# Patient Record
Sex: Female | Born: 1940 | Race: White | Hispanic: No | Marital: Married | Smoking: Former smoker
Health system: Southern US, Community
[De-identification: ages and names within clinical notes are randomized; demographics above are authoritative.]

## PROBLEM LIST (undated history)

## (undated) DIAGNOSIS — I471 Supraventricular tachycardia, unspecified: Secondary | ICD-10-CM

## (undated) DIAGNOSIS — I472 Ventricular tachycardia, unspecified: Secondary | ICD-10-CM

## (undated) DIAGNOSIS — G510 Bell's palsy: Secondary | ICD-10-CM

## (undated) DIAGNOSIS — Z9981 Dependence on supplemental oxygen: Secondary | ICD-10-CM

## (undated) DIAGNOSIS — E785 Hyperlipidemia, unspecified: Secondary | ICD-10-CM

## (undated) DIAGNOSIS — I714 Abdominal aortic aneurysm, without rupture, unspecified: Secondary | ICD-10-CM

## (undated) DIAGNOSIS — I4891 Unspecified atrial fibrillation: Secondary | ICD-10-CM

## (undated) DIAGNOSIS — M1990 Unspecified osteoarthritis, unspecified site: Secondary | ICD-10-CM

## (undated) DIAGNOSIS — I10 Essential (primary) hypertension: Secondary | ICD-10-CM

## (undated) DIAGNOSIS — I509 Heart failure, unspecified: Secondary | ICD-10-CM

## (undated) DIAGNOSIS — C679 Malignant neoplasm of bladder, unspecified: Secondary | ICD-10-CM

## (undated) DIAGNOSIS — R0602 Shortness of breath: Secondary | ICD-10-CM

## (undated) DIAGNOSIS — H409 Unspecified glaucoma: Secondary | ICD-10-CM

## (undated) DIAGNOSIS — J189 Pneumonia, unspecified organism: Secondary | ICD-10-CM

## (undated) DIAGNOSIS — T4145XA Adverse effect of unspecified anesthetic, initial encounter: Secondary | ICD-10-CM

## (undated) DIAGNOSIS — K219 Gastro-esophageal reflux disease without esophagitis: Secondary | ICD-10-CM

## (undated) DIAGNOSIS — I679 Cerebrovascular disease, unspecified: Secondary | ICD-10-CM

## (undated) DIAGNOSIS — D649 Anemia, unspecified: Secondary | ICD-10-CM

## (undated) DIAGNOSIS — J449 Chronic obstructive pulmonary disease, unspecified: Secondary | ICD-10-CM

## (undated) DIAGNOSIS — T8859XA Other complications of anesthesia, initial encounter: Secondary | ICD-10-CM

## (undated) DIAGNOSIS — I6529 Occlusion and stenosis of unspecified carotid artery: Secondary | ICD-10-CM

## (undated) DIAGNOSIS — E669 Obesity, unspecified: Secondary | ICD-10-CM

## (undated) DIAGNOSIS — I2510 Atherosclerotic heart disease of native coronary artery without angina pectoris: Secondary | ICD-10-CM

## (undated) DIAGNOSIS — G43909 Migraine, unspecified, not intractable, without status migrainosus: Secondary | ICD-10-CM

## (undated) HISTORY — DX: Unspecified atrial fibrillation: I48.91

## (undated) HISTORY — DX: Occlusion and stenosis of unspecified carotid artery: I65.29

## (undated) HISTORY — DX: Supraventricular tachycardia, unspecified: I47.10

## (undated) HISTORY — DX: Obesity, unspecified: E66.9

## (undated) HISTORY — DX: Unspecified glaucoma: H40.9

## (undated) HISTORY — DX: Essential (primary) hypertension: I10

## (undated) HISTORY — DX: Abdominal aortic aneurysm, without rupture: I71.4

## (undated) HISTORY — DX: Abdominal aortic aneurysm, without rupture, unspecified: I71.40

## (undated) HISTORY — DX: Ventricular tachycardia, unspecified: I47.20

## (undated) HISTORY — DX: Bell's palsy: G51.0

## (undated) HISTORY — PX: BLADDER TUMOR EXCISION: SHX238

## (undated) HISTORY — PX: CYSTOSCOPY: SHX5120

## (undated) HISTORY — DX: Atherosclerotic heart disease of native coronary artery without angina pectoris: I25.10

## (undated) HISTORY — PX: CARDIAC CATHETERIZATION: SHX172

## (undated) HISTORY — DX: Hyperlipidemia, unspecified: E78.5

## (undated) HISTORY — DX: Cerebrovascular disease, unspecified: I67.9

## (undated) HISTORY — DX: Anemia, unspecified: D64.9

## (undated) HISTORY — DX: Ventricular tachycardia: I47.2

## (undated) HISTORY — DX: Supraventricular tachycardia: I47.1

## (undated) HISTORY — PX: CYSTOSCOPY: SUR368

## (undated) HISTORY — PX: CATARACT EXTRACTION W/ INTRAOCULAR LENS  IMPLANT, BILATERAL: SHX1307

## (undated) HISTORY — DX: Chronic obstructive pulmonary disease, unspecified: J44.9

---

## 1978-10-27 HISTORY — PX: TUBAL LIGATION: SHX77

## 1978-10-27 HISTORY — PX: WEDGE RESECTION: SHX5070

## 2004-02-15 ENCOUNTER — Ambulatory Visit: Payer: Self-pay | Admitting: Family Medicine

## 2004-02-22 ENCOUNTER — Ambulatory Visit: Payer: Self-pay | Admitting: Family Medicine

## 2004-03-20 ENCOUNTER — Ambulatory Visit: Payer: Self-pay | Admitting: Family Medicine

## 2004-05-02 ENCOUNTER — Ambulatory Visit: Payer: Self-pay | Admitting: Family Medicine

## 2004-10-02 ENCOUNTER — Ambulatory Visit: Payer: Self-pay | Admitting: Family Medicine

## 2004-11-30 ENCOUNTER — Ambulatory Visit: Payer: Self-pay | Admitting: Family Medicine

## 2004-12-17 ENCOUNTER — Ambulatory Visit: Payer: Self-pay | Admitting: Family Medicine

## 2004-12-28 ENCOUNTER — Ambulatory Visit: Payer: Self-pay | Admitting: Family Medicine

## 2005-05-01 ENCOUNTER — Ambulatory Visit: Payer: Self-pay | Admitting: Family Medicine

## 2005-05-20 ENCOUNTER — Ambulatory Visit: Payer: Self-pay | Admitting: Family Medicine

## 2005-05-27 ENCOUNTER — Ambulatory Visit (HOSPITAL_COMMUNITY): Admission: RE | Admit: 2005-05-27 | Discharge: 2005-05-27 | Payer: Self-pay | Admitting: Family Medicine

## 2005-06-03 ENCOUNTER — Ambulatory Visit: Payer: Self-pay | Admitting: Family Medicine

## 2005-06-17 ENCOUNTER — Ambulatory Visit: Payer: Self-pay | Admitting: Gastroenterology

## 2005-06-21 ENCOUNTER — Ambulatory Visit: Payer: Self-pay | Admitting: Gastroenterology

## 2005-06-26 ENCOUNTER — Ambulatory Visit: Payer: Self-pay | Admitting: Family Medicine

## 2005-07-17 ENCOUNTER — Ambulatory Visit: Payer: Self-pay | Admitting: Family Medicine

## 2005-08-08 ENCOUNTER — Ambulatory Visit: Payer: Self-pay | Admitting: Family Medicine

## 2005-09-04 ENCOUNTER — Ambulatory Visit: Payer: Self-pay | Admitting: Family Medicine

## 2005-09-26 ENCOUNTER — Ambulatory Visit: Payer: Self-pay | Admitting: Family Medicine

## 2006-01-06 ENCOUNTER — Ambulatory Visit: Payer: Self-pay | Admitting: Family Medicine

## 2006-02-21 ENCOUNTER — Ambulatory Visit: Payer: Self-pay | Admitting: Family Medicine

## 2006-03-10 ENCOUNTER — Ambulatory Visit: Payer: Self-pay | Admitting: Family Medicine

## 2006-05-20 ENCOUNTER — Ambulatory Visit: Payer: Self-pay | Admitting: Family Medicine

## 2006-06-09 ENCOUNTER — Ambulatory Visit: Payer: Self-pay | Admitting: Family Medicine

## 2006-10-14 ENCOUNTER — Ambulatory Visit: Payer: Self-pay | Admitting: Internal Medicine

## 2006-10-24 ENCOUNTER — Ambulatory Visit (HOSPITAL_COMMUNITY): Admission: RE | Admit: 2006-10-24 | Discharge: 2006-10-24 | Payer: Self-pay | Admitting: Urology

## 2006-10-30 ENCOUNTER — Ambulatory Visit: Payer: Self-pay | Admitting: Cardiology

## 2006-11-11 ENCOUNTER — Ambulatory Visit (HOSPITAL_COMMUNITY): Admission: RE | Admit: 2006-11-11 | Discharge: 2006-11-11 | Payer: Self-pay | Admitting: Urology

## 2006-11-11 ENCOUNTER — Encounter (INDEPENDENT_AMBULATORY_CARE_PROVIDER_SITE_OTHER): Payer: Self-pay | Admitting: Urology

## 2007-02-26 DIAGNOSIS — C679 Malignant neoplasm of bladder, unspecified: Secondary | ICD-10-CM

## 2007-02-26 HISTORY — DX: Malignant neoplasm of bladder, unspecified: C67.9

## 2007-03-12 ENCOUNTER — Encounter: Payer: Self-pay | Admitting: Internal Medicine

## 2007-03-20 ENCOUNTER — Ambulatory Visit: Payer: Self-pay | Admitting: Cardiology

## 2007-04-14 ENCOUNTER — Ambulatory Visit: Payer: Self-pay

## 2007-04-14 ENCOUNTER — Encounter: Payer: Self-pay | Admitting: Cardiology

## 2007-04-16 ENCOUNTER — Ambulatory Visit: Payer: Self-pay | Admitting: Internal Medicine

## 2007-11-04 ENCOUNTER — Ambulatory Visit: Payer: Self-pay | Admitting: Cardiology

## 2007-11-04 ENCOUNTER — Ambulatory Visit: Payer: Self-pay

## 2008-01-08 ENCOUNTER — Encounter (INDEPENDENT_AMBULATORY_CARE_PROVIDER_SITE_OTHER): Payer: Self-pay | Admitting: Urology

## 2008-01-08 ENCOUNTER — Ambulatory Visit (HOSPITAL_BASED_OUTPATIENT_CLINIC_OR_DEPARTMENT_OTHER): Admission: RE | Admit: 2008-01-08 | Discharge: 2008-01-08 | Payer: Self-pay | Admitting: Urology

## 2008-05-11 ENCOUNTER — Ambulatory Visit: Payer: Self-pay

## 2008-06-10 ENCOUNTER — Ambulatory Visit (HOSPITAL_BASED_OUTPATIENT_CLINIC_OR_DEPARTMENT_OTHER): Admission: RE | Admit: 2008-06-10 | Discharge: 2008-06-10 | Payer: Self-pay | Admitting: Urology

## 2008-06-10 ENCOUNTER — Encounter (INDEPENDENT_AMBULATORY_CARE_PROVIDER_SITE_OTHER): Payer: Self-pay | Admitting: Urology

## 2008-07-26 ENCOUNTER — Encounter: Payer: Self-pay | Admitting: Cardiology

## 2008-08-17 DIAGNOSIS — H409 Unspecified glaucoma: Secondary | ICD-10-CM | POA: Insufficient documentation

## 2008-08-17 DIAGNOSIS — I10 Essential (primary) hypertension: Secondary | ICD-10-CM | POA: Insufficient documentation

## 2008-08-17 DIAGNOSIS — E785 Hyperlipidemia, unspecified: Secondary | ICD-10-CM

## 2008-08-17 DIAGNOSIS — J449 Chronic obstructive pulmonary disease, unspecified: Secondary | ICD-10-CM

## 2008-08-17 DIAGNOSIS — I679 Cerebrovascular disease, unspecified: Secondary | ICD-10-CM

## 2008-08-17 DIAGNOSIS — I498 Other specified cardiac arrhythmias: Secondary | ICD-10-CM | POA: Insufficient documentation

## 2008-08-17 DIAGNOSIS — E669 Obesity, unspecified: Secondary | ICD-10-CM

## 2008-08-18 ENCOUNTER — Ambulatory Visit: Payer: Self-pay | Admitting: Cardiology

## 2008-08-18 DIAGNOSIS — F17200 Nicotine dependence, unspecified, uncomplicated: Secondary | ICD-10-CM

## 2008-08-18 DIAGNOSIS — R0602 Shortness of breath: Secondary | ICD-10-CM

## 2008-11-01 ENCOUNTER — Encounter: Payer: Self-pay | Admitting: Cardiology

## 2008-11-17 ENCOUNTER — Ambulatory Visit: Payer: Self-pay

## 2008-11-17 ENCOUNTER — Encounter: Payer: Self-pay | Admitting: Cardiology

## 2009-02-03 ENCOUNTER — Encounter: Payer: Self-pay | Admitting: Cardiovascular Disease

## 2009-05-08 ENCOUNTER — Encounter: Payer: Self-pay | Admitting: Cardiology

## 2009-05-19 ENCOUNTER — Encounter: Payer: Self-pay | Admitting: Cardiology

## 2009-05-19 DIAGNOSIS — I6529 Occlusion and stenosis of unspecified carotid artery: Secondary | ICD-10-CM

## 2009-05-23 ENCOUNTER — Ambulatory Visit: Payer: Self-pay | Admitting: Cardiology

## 2009-05-23 ENCOUNTER — Ambulatory Visit: Payer: Self-pay

## 2009-06-19 ENCOUNTER — Ambulatory Visit: Payer: Self-pay | Admitting: Surgery

## 2009-11-09 ENCOUNTER — Encounter: Payer: Self-pay | Admitting: Cardiology

## 2009-11-15 ENCOUNTER — Encounter: Payer: Self-pay | Admitting: Cardiology

## 2009-12-29 ENCOUNTER — Ambulatory Visit: Payer: Self-pay

## 2009-12-29 ENCOUNTER — Ambulatory Visit: Payer: Self-pay | Admitting: Cardiology

## 2009-12-29 DIAGNOSIS — I714 Abdominal aortic aneurysm, without rupture: Secondary | ICD-10-CM

## 2010-01-08 ENCOUNTER — Ambulatory Visit: Payer: Self-pay | Admitting: Surgery

## 2010-01-08 ENCOUNTER — Encounter: Payer: Self-pay | Admitting: Cardiology

## 2010-01-23 ENCOUNTER — Telehealth (INDEPENDENT_AMBULATORY_CARE_PROVIDER_SITE_OTHER): Payer: Self-pay | Admitting: Radiology

## 2010-01-24 ENCOUNTER — Encounter (HOSPITAL_COMMUNITY)
Admission: RE | Admit: 2010-01-24 | Discharge: 2010-03-27 | Payer: Self-pay | Source: Home / Self Care | Attending: Surgery | Admitting: Surgery

## 2010-01-24 ENCOUNTER — Encounter: Payer: Self-pay | Admitting: Cardiology

## 2010-01-24 ENCOUNTER — Ambulatory Visit: Payer: Self-pay | Admitting: Cardiology

## 2010-01-24 ENCOUNTER — Ambulatory Visit: Payer: Self-pay

## 2010-01-25 ENCOUNTER — Ambulatory Visit: Payer: Self-pay | Admitting: Internal Medicine

## 2010-01-26 ENCOUNTER — Telehealth: Payer: Self-pay | Admitting: Internal Medicine

## 2010-03-02 ENCOUNTER — Ambulatory Visit: Admit: 2010-03-02 | Payer: Self-pay | Admitting: Internal Medicine

## 2010-03-05 ENCOUNTER — Ambulatory Visit: Admit: 2010-03-05 | Payer: Self-pay | Admitting: Surgery

## 2010-03-18 ENCOUNTER — Encounter: Payer: Self-pay | Admitting: Family Medicine

## 2010-03-29 NOTE — Assessment & Plan Note (Signed)
Summary: Cardiology Nuclear Testing  Nuclear Med Background Indications for Stress Test: Evaluation for Ischemia, Surgical Clearance  Indications Comments: Pending Carotid Endarterectomy by Coral Else.  History: COPD, Echo, Emphysema, Myocardial Perfusion Study  History Comments: 10-15 yrs ago Cath: OK per patient. '03 MPI ant thinning/ nml EF  '09 Echo nml EF Hx. SVT, AAA 3.5CM.  Symptoms: Chest Pain, Chest Pain with Exertion, DOE, Fatigue, Fatigue with Exertion, Light-Headedness, Rapid HR    Nuclear Pre-Procedure Cardiac Risk Factors: Carotid Disease, Family History - CAD, Hypertension, Lipids, PVD, Smoker Caffeine/Decaff Intake: None NPO After: 11:00 PM Lungs: audible wheezes, diminished lung fields, no wheezing IV 0.9% NS with Angio Cath: 22g     IV Site: R Hand IV Started by: Bonnita Levan, RN Chest Size (in) 50     Height (in): 65 Weight (lb): 181 BMI: 30.23 Tech Comments:  Nebulizer treatment with Albuterol 5mg  solution via aerosol mask with 8L O2 given with Lexiscan.  Nuclear Med Study 1 or 2 day study:  1 day     Stress Test Type:  Eugenie Birks Reading MD:  Marca Ancona, MD     Referring MD:  Coral Else Resting Radionuclide:  Technetium 25m Tetrofosmin     Resting Radionuclide Dose:  11.0 mCi  Stress Radionuclide:  Technetium 40m Tetrofosmin     Stress Radionuclide Dose:  33.0 mCi   Stress Protocol      Max HR:  114 bpm     Predicted Max HR:  152 bpm  Max Systolic BP: 100 mm Hg     Percent Max HR:  75 %Rate Pressure Product:  81191  Lexiscan: 0.4 mg   Stress Test Technologist:  Irean Hong,  RN     Nuclear Technologist:  Doyne Keel, CNMT  Rest Procedure  Myocardial perfusion imaging was performed at rest 45 minutes following the intravenous administration of Technetium 92m Tetrofosmin.  Stress Procedure  The patient received IV Lexiscan 0.4 mg over 15-seconds.  Technetium 38m Tetrofosmin injected at 30-seconds.  There were nonspecific  changes with  Lexiscan, rare PVC.  Quantitative spect images were obtained after a 45 minute delay.  QPS Raw Data Images:  Prominent breast shadow.  Stress Images:  Small apical perfusion defect.  Rest Images:  Small apical perfusion defect.  Subtraction (SDS):  Small fixed apical perfusion defect.  Transient Ischemic Dilatation:  1.04  (Normal <1.22)  Lung/Heart Ratio:  0.39  (Normal <0.45)  Quantitative Gated Spect Images QGS EDV:  38 ml QGS ESV:  12 ml QGS EF:  68 % QGS cine images:  Normal wall motion.    Overall Impression  Exercise Capacity: Lexiscan with no exercise. BP Response: Normal blood pressure response. Clinical Symptoms: Short of breath.  ECG Impression: No significant ST segment change suggestive of ischemia. Overall Impression: Small fixed apical perfusion defect.  Prominent breast shadow.  Suspect attenuation artifact with no ischemia or infarction.   Appended Document: Cardiology Nuclear Testing copy faxed to Dr. Myra Gianotti

## 2010-03-29 NOTE — Miscellaneous (Signed)
Summary: Orders Update  Clinical Lists Changes  Problems: Added new problem of CAROTID ARTERY STENOSIS (ICD-433.10) Orders: Added new Test order of Carotid Duplex (Carotid Duplex) - Signed 

## 2010-03-29 NOTE — Progress Notes (Signed)
Summary: nuc pre-procedure  Phone Note Outgoing Call   Call placed by: Domenic Polite, CNMT,  January 23, 2010 12:01 PM Call placed to: Patient Reason for Call: Confirm/change Appt Summary of Call: Reviewed information on Myoview Information Sheet (see scanned document for further details).  Spoke with patient.      Nuclear Med Background Indications for Stress Test: Evaluation for Ischemia, Surgical Clearance  Indications Comments: Pending Carotid Endarterectomy- Coral Else  History: COPD, Echo, Myocardial Perfusion Study  History Comments: '03 MPI ant thinning/ nml EF; '09 Echo nml EF  Symptoms: DOE    Nuclear Pre-Procedure Cardiac Risk Factors: Carotid Disease, Hypertension, Lipids, PVD, Smoker Height (in): 65 Tech Comments: 3.5 cm. AAA

## 2010-03-29 NOTE — Assessment & Plan Note (Signed)
Summary: f68m/dm   History of Present Illness: [name] [name] is a pleasant female who has a history of supraventricular tachycardia, hypertension, hyperlipidemia, and cerebrovascular disease. She had a catheterization in 1997 that showed no obstructive disease. Her last Myoview was performed on October 08, 2001.  At that time, she had an ejection fraction of 71%.  There was no evidence of scar ischemia.  Echocardiogram in February of 2009 showed normal LV function, mild biatrial enlargement, and mild tricuspid regurgitation. She also has a history of SVT.  She was seen  by Dr. Ladona Ridgel.    He apparently increased her beta-blocker and felt ablation could be considered if her symptoms worsened. Last carotid Dopplers in March of 2010 showed a 60-79% left internal carotid artery stenosis and a 0-39% right. I last saw her in June 2010. Since then she has dyspnea on exertion. It is unchanged compared to previous. It is relieved with rest. There is no associated chest pain. There is no orthopnea, PND, pedal edema, palpitations or syncope. She attributes her dyspnea to COPD. She does continue to smoke.  Current Medications (verified): 1)  Aspirin 81 Mg Tbec (Aspirin) .... Take One Tablet By Mouth Daily 2)  Lipitor 80 Mg Tabs (Atorvastatin Calcium) .... Take One Tablet By Mouth Daily. 3)  Metoprolol Succinate 50 Mg Xr24h-Tab (Metoprolol Succinate) .... Take One Tablet By Mouth Daily 4)  Benicar Hct 40-12.5 Mg Tabs (Olmesartan Medoxomil-Hctz) .Marland Kitchen.. 1 Tab By Mouth Once Daily 5)  Tylenol .... As Needed 6)  Hydrochlorothiazide .... As Needed 7)  Travatan Z 0.004 % Soln (Travoprost) .Marland Kitchen.. 1 Drop Each Eye 8)  Albuterol Sulfate .... Once Daily 9)  Symbicort .... Once Daily 10)  Nebulizer .... Once Daily 11)  Fish Oil   Oil (Fish Oil) .Marland Kitchen.. 1 Tab By Mouth Once Daily  Past History:  Past Medical History: Reviewed history from 08/17/2008 and no changes required. Current Problems:  SUPRAVENTRICULAR TACHYCARDIA  (ICD-427.89) HYPERLIPIDEMIA (ICD-272.4) HYPERTENSION (ICD-401.9) CEREBROVASCULAR DISEASE (ICD-437.9) GLAUCOMA (ICD-365.9) OBESITY (ICD-278.00) COPD (ICD-496)  Past Surgical History: Reviewed history from 08/17/2008 and no changes required.   Cystoscopy, resection of 5 bladder tumors, and fulguration  of 1 small bladder tumor.       Cystoscopy, cold cup bladder biopsy of five tumors and  fulguration of one tumor.    Remote right lung surgery.    Bilateral tubal ligation.   Social History: Reviewed history from 08/17/2008 and no changes required.  The patient continues to smoke cigarettes smoking one-   half to one pack per day and has done so all of her adult life.  She   denies alcohol abuse.  She states that she likes to gamble in [place],   [place].      Review of Systems       Some arthralgias but no fevers or chills, productive cough, hemoptysis, dysphasia, odynophagia, melena, hematochezia, dysuria, hematuria, rash, seizure activity, orthopnea, PND, pedal edema, claudication. Remaining systems are negative.   Vital Signs:  Patient profile:   70 year old female Height:      65 inches Weight:      186 pounds BMI:     31.06 Pulse rate:   87 / minute Resp:     18 per minute BP sitting:   136 / 79  (left arm)  Vitals Entered By: Kem Parkinson (May 23, 2009 11:55 AM)  Physical Exam  General:  Well-developed obese in no acute distress.  Skin is warm and dry.  HEENT is  normal.  Neck is supple. No thyromegaly. left carotid bruit Chest is diminished breath sounds throughout Cardiovascular exam is regular rate and rhythm.  Abdominal exam nontender or distended. No masses palpated. Extremities show no edema. neuro grossly intact    EKG  Procedure date:  05/23/2009  Findings:      Sinus rhythm at a rate of 87. Right axis deviation. No significant ST changes.  Impression & Recommendations:  Problem # 1:  CAROTID ARTERY STENOSIS  (ICD-433.10) Continued aspirin and statin. Preliminary results of carotid Dopplers suggests severe disease on the left. Once we have the final results we will refer to vascular surgery if needed. If indeed she needs intervention then she will need a preoperative stress test. Her updated medication list for this problem includes:    Aspirin 81 Mg Tbec (Aspirin) .Marland Kitchen... Take one tablet by mouth daily  Problem # 2:  TOBACCO ABUSE (ICD-305.1) Patient counseled on discontinuing for between 3-10 minutes.  Problem # 3:  SUPRAVENTRICULAR TACHYCARDIA (ICD-427.89)  Nrecent symptoms. Continue beta blocker. Consider ablation in the future if refractory symptoms. Her updated medication list for this problem includes:    Aspirin 81 Mg Tbec (Aspirin) .Marland Kitchen... Take one tablet by mouth daily    Metoprolol Succinate 50 Mg Xr24h-tab (Metoprolol succinate) .Marland Kitchen... Take one tablet by mouth daily  Her updated medication list for this problem includes:    Aspirin 81 Mg Tbec (Aspirin) .Marland Kitchen... Take one tablet by mouth daily    Metoprolol Succinate 50 Mg Xr24h-tab (Metoprolol succinate) .Marland Kitchen... Take one tablet by mouth daily  Problem # 4:  HYPERLIPIDEMIA (ICD-272.4) Continue statin. Lipids and liver monitored by primary care. Her updated medication list for this problem includes:    Lipitor 80 Mg Tabs (Atorvastatin calcium) .Marland Kitchen... Take one tablet by mouth daily.  Her updated medication list for this problem includes:    Lipitor 80 Mg Tabs (Atorvastatin calcium) .Marland Kitchen... Take one tablet by mouth daily.  Problem # 5:  HYPERTENSION (ICD-401.9) Blood pressure controlled on present medications. Will continue. Renal function and potassium monitored by primary care. Her updated medication list for this problem includes:    Aspirin 81 Mg Tbec (Aspirin) .Marland Kitchen... Take one tablet by mouth daily    Metoprolol Succinate 50 Mg Xr24h-tab (Metoprolol succinate) .Marland Kitchen... Take one tablet by mouth daily    Benicar Hct 40-12.5 Mg Tabs (Olmesartan  medoxomil-hctz) .Marland Kitchen... 1 tab by mouth once daily  Problem # 6:  COPD (ICD-496)  Patient Instructions: 1)  Your physician recommends that you schedule a follow-up appointment in: 6 months.

## 2010-03-29 NOTE — Letter (Signed)
Summary: [facility] Update   [facility] Update   Imported By: Roderic Ovens 12/06/2009 10:56:06  _____________________________________________________________________  External Attachment:    Type:   Image     Comment:   External Document

## 2010-03-29 NOTE — Assessment & Plan Note (Signed)
Summary: 6 month rov.sl   History of Present Illness: Mrs. Frison is a pleasant female who has a history of supraventricular tachycardia, hypertension, hyperlipidemia, and cerebrovascular disease. She had a catheterization in 1997 that showed no obstructive disease. Her last Myoview was performed on October 08, 2001.  At that time, she had an ejection fraction of 71%.  There was no evidence of scar ischemia.  Echocardiogram in February of 2009 showed normal LV function, mild biatrial enlargement, and mild tricuspid regurgitation. She also has a history of SVT.  She was seen  by Dr. Ladona Ridgel. He apparently increased her beta-blocker and felt ablation could be considered if her symptoms worsened. Last carotid Dopplers in March of 2011 showed a 80-99% left internal carotid artery stenosis and a 0-39% right. F/U recommended in 6 months. Abdominal CAT scan in September of 2011 showed a 3.5 cm abdominal aortic aneurysm. I last saw her in March of 2011. Since then she has dyspnea on exertion. It is unchanged compared to previous. It is relieved with rest. There is no associated chest pain. There is no orthopnea, PND, pedal edema, palpitations or syncope. She attributes her dyspnea to COPD. She does continue to smoke.  Current Medications (verified): 1)  Aspirin 81 Mg Tbec (Aspirin) .... Take One Tablet By Mouth Daily 2)  Lipitor 80 Mg Tabs (Atorvastatin Calcium) .... Take One Tablet By Mouth Daily. 3)  Metoprolol Succinate 50 Mg Xr24h-Tab (Metoprolol Succinate) .... Take One Tablet By Mouth Daily 4)  Benicar Hct 40-12.5 Mg Tabs (Olmesartan Medoxomil-Hctz) .Marland Kitchen.. 1 Tab By Mouth Once Daily 5)  Tylenol 325 Mg Tabs (Acetaminophen) .... As Needed 6)  Hydrochlorothiazide  (Hydrochlorothiazide) .... As Needed 7)  Travatan Z 0.004 % Soln (Travoprost) .Marland Kitchen.. 1 Drop Each Eye 8)  Albuterol Sulfate .... Once Daily 9)  Symbicort 80-4.5 Mcg/act Aero (Budesonide-Formoterol Fumarate) .... As Directed 10)  Nebulizer .... Once  Daily 11)  Fish Oil   Oil (Fish Oil) .Marland Kitchen.. 1 Tab By Mouth Once Daily 12)  Bc Powder .... As Directed  Past History:  Past Medical History: SUPRAVENTRICULAR TACHYCARDIA (ICD-427.89) HYPERLIPIDEMIA (ICD-272.4) HYPERTENSION (ICD-401.9) CEREBROVASCULAR DISEASE (ICD-437.9) GLAUCOMA (ICD-365.9) OBESITY (ICD-278.00) COPD (ICD-496)  Past Surgical History: Cystoscopy, resection of 5 bladder tumors, and fulguration  of 1 small bladder tumor.  Cystoscopy, cold cup bladder biopsy of five tumors and  fulguration of one tumor.  Remote right lung surgery.  Bilateral tubal ligation.   Social History: Reviewed history from 08/17/2008 and no changes required. The patient continues to smoke cigarettes smoking one- half to one pack per day and has done so all of her adult life.  She denies alcohol abuse.  She states that she likes to gamble in [place], [place].      Review of Systems       no fevers or chills, productive cough, hemoptysis, dysphasia, odynophagia, melena, hematochezia, dysuria, hematuria, rash, seizure activity, orthopnea, PND, pedal edema, claudication. Remaining systems are negative.   Vital Signs:  Patient profile:   70 year old female Height:      65 inches Weight:      186 pounds BMI:     31.06 Pulse rate:   86 / minute Resp:     18 per minute BP sitting:   120 / 80  (left arm)  Vitals Entered By: Kem Parkinson (December 29, 2009 1:54 PM)  Physical Exam  General:  Well-developed well-nourished in no acute distress.  Skin is warm and dry.  HEENT is normal.  Neck is supple. No thyromegaly.  Chest is diminished throughout Cardiovascular exam is regular rate and rhythm.  Abdominal exam nontender or distended. No masses palpated. Extremities show no edema. neuro grossly intact    EKG  Procedure date:  12/29/2009  Findings:      Sinus rhythm at a rate of 86. No ST changes.  Impression & Recommendations:  Problem # 1:  ABDOMINAL AORTIC ANEURYSM  (ICD-441.4) Followup abdominal ultrasound September 2012.  Problem # 2:  CAROTID ARTERY STENOSIS (ICD-433.10) Referred back to vascular surgery. Continue aspirin and statin. Await final results of carotid Dopplers performed today. In surgery required she will need a Myoview preoperatively. Her updated medication list for this problem includes:    Aspirin 81 Mg Tbec (Aspirin) .Marland Kitchen... Take one tablet by mouth daily  Orders: [facility] Referral ([facility] Ref)  Problem # 3:  TOBACCO ABUSE (ICD-305.1) Patient counseled on discontinuing.  Problem # 4:  SUPRAVENTRICULAR TACHYCARDIA (ICD-427.89) Continue beta blocker. Her updated medication list for this problem includes:    Aspirin 81 Mg Tbec (Aspirin) .Marland Kitchen... Take one tablet by mouth daily    Metoprolol Succinate 50 Mg Xr24h-tab (Metoprolol succinate) .Marland Kitchen... Take one tablet by mouth daily  Problem # 5:  HYPERLIPIDEMIA (ICD-272.4) Continue statin. Lipids and liver monitored by primary care. Her updated medication list for this problem includes:    Lipitor 80 Mg Tabs (Atorvastatin calcium) .Marland Kitchen... Take one tablet by mouth daily.  Problem # 6:  HYPERTENSION (ICD-401.9) Blood pressure control. Potassium and renal function monitored by primary care. Her updated medication list for this problem includes:    Aspirin 81 Mg Tbec (Aspirin) .Marland Kitchen... Take one tablet by mouth daily    Metoprolol Succinate 50 Mg Xr24h-tab (Metoprolol succinate) .Marland Kitchen... Take one tablet by mouth daily    Benicar Hct 40-12.5 Mg Tabs (Olmesartan medoxomil-hctz) .Marland Kitchen... 1 tab by mouth once daily  Problem # 7:  COPD (ICD-496)  Her updated medication list for this problem includes:    Symbicort 80-4.5 Mcg/act Aero (Budesonide-formoterol fumarate) .Marland Kitchen... As directed  Patient Instructions: 1)  Your physician recommends that you schedule a follow-up appointment in: 6 MONTHS 2)  You have been referred to DR [facility]

## 2010-03-29 NOTE — Letter (Signed)
Summary: [facility]  [facility]   Imported By: Sherian Rein 01/16/2010 14:08:22  _____________________________________________________________________  External Attachment:    Type:   Image     Comment:   External Document  Appended Document: [facility] lexiscan myoview needs to be scheduled for preop eval prior to carotid endarterectomy  Appended Document: [facility] myoview scheduled for 01-24-10 for pre-op clearence

## 2010-03-29 NOTE — Letter (Signed)
Summary: [facility] Office Visit Note   [facility] Office Visit Note   Imported By: Roderic Ovens 12/05/2009 09:48:15  _____________________________________________________________________  External Attachment:    Type:   Image     Comment:   External Document

## 2010-03-29 NOTE — Miscellaneous (Signed)
Summary: Orders Update  Clinical Lists Changes  Orders: Added new Test order of Carotid Duplex (Carotid Duplex) - Signed 

## 2010-03-29 NOTE — Progress Notes (Signed)
Summary: O2 / HEARTRATE > ok to use a needed  Phone Note Call from Patient Call back at [facility] Phone [phone]   Caller: Patient Call For: Gwendlyn Hanback Summary of Call: PT WAS SEEN YESTERDAY. SHE SAYS THAT SHE SLEPT W/ O2 AS INSTRUCTED ON 2.5L. WOKE UP DURING THE NIGHT WITH HER HEART "POUNDING". PLS ADVISE.  Initial call taken by: Tivis Ringer, CNA,  January 26, 2010 2:57 PM  Follow-up for Phone Call        Spoke with pt  and she states she wore the oxygen at night as directed per MW and she states she got a headache, felt her heart beating fast during the night so she stopped the oxygen and took an aspirin and the symptoms went away. Pt has not used the oxygen today at all. Pt wanted to [facility] if the increse in oxygen to 2.5 liters could cause these symptoms. Please advise. Carron Curie CMA  January 26, 2010 4:19 PM discussed with pt ok to just use 02 as needed for now as long as not smoking  Follow-up by: Nyoka Cowden MD,  January 26, 2010 5:38 PM

## 2010-03-29 NOTE — Assessment & Plan Note (Signed)
Summary: Pulmonary pre op eval, still smoking with desat ra p 50 ft    Copy to:  Dr. Derl Barrow Primary Seanpaul Preece/Referring Allie Gerhold:  Dr. Joette Catching  CC:  Dyspnea- needs surgery clearance.  History of Present Illness: 68 yowf active smoker referred to pulmonary by Dr Jens Som for preop eval of copd previously GOLD III/IV in 2008 and needs L CEA   January 25, 2010  1st pulmonary office eval for worse doe x 3 weeks to point x 50 ft assoc with feeling light headed on benicar and smoking, not using 02.  Pt denies any significant sore throat,   itching, sneezing,  nasal congestion or excess secretions,  fever, chills, sweats, unintended wt loss, pleuritic or exertional cp, hempoptysis, change in activity tolerance  orthopnea pnd or leg swelling.  Feels neb works the best to relieve sob.  denies excess am sputum or noct resp distress.  Current Medications (verified): 1)  Aspirin 81 Mg Tbec (Aspirin) .... Take One Tablet By Mouth Daily 2)  Lipitor 80 Mg Tabs (Atorvastatin Calcium) .... Take One Tablet By Mouth Daily. 3)  Metoprolol Succinate 50 Mg Xr24h-Tab (Metoprolol Succinate) .... Take One Tablet By Mouth Daily 4)  Benicar Hct 20-12.5 Mg Tabs (Olmesartan Medoxomil-Hctz) .Marland Kitchen.. 1 Once Daily 5)  Tylenol With Codeine #3 300-30 Mg Tabs (Acetaminophen-Codeine) .Marland Kitchen.. 1 Every 6 Hrs As Needed 6)  Hydrochlorothiazide 25 Mg Tabs (Hydrochlorothiazide) .Marland Kitchen.. 1 Once Daily As Needed 7)  Travatan Z 0.004 % Soln (Travoprost) .Marland Kitchen.. 1 Drop Each Eye Once Daily 8)  Ipratropium-Albuterol 0.5-2.5 (3) Mg/3ml Soln (Ipratropium-Albuterol) .Marland Kitchen.. 1 Once Daily As Needed 9)  Symbicort 80-4.5 Mcg/act Aero (Budesonide-Formoterol Fumarate) .... 2 Puffs Two Times A Day 10)  Fish Oil   Oil (Fish Oil) .Marland Kitchen.. 1 Tab By Mouth Once Daily 11)  Goodys Body Pain 500-325 Mg Pack (Aspirin-Acetaminophen) .... As Needed 12)  Oxygen 2.5 Liters .... As Needed With Exertion  Allergies (verified): No Known Drug Allergies  Past  History:  Past Medical History: SUPRAVENTRICULAR TACHYCARDIA (ICD-427.89) HYPERLIPIDEMIA (ICD-272.4) HYPERTENSION (ICD-401.9) CEREBROVASCULAR DISEASE (ICD-437.9)      - 80% R Carotid stenosis - see note by Bradham11/16/11 GLAUCOMA (ICD-365.9) OBESITY (ICD-278.00) COPD (ICD-496)      - PFT's [id number]  FEV1   .85(37%) ratio 47 and 11 % better p B2, DLC0 42%      - Walking sats January 25, 2010 > desat even on 2lpm > rec 2.5 lpm 24 h per day   Family History: Negative for respiratory diseases or atopy  pos heart dz only  Social History: The patient continues to smoke cigarettes smoking one- half to one pack per day and has done so all of her adult life.  She denies alcohol abuse.  She states that she likes to gamble in [place], [place].  Retired Conservator, museum/gallery Married  Review of Systems       The patient complains of shortness of breath with activity, shortness of breath at rest, productive cough, irregular heartbeats, indigestion, difficulty swallowing, nasal congestion/difficulty breathing through nose, hand/feet swelling, and joint stiffness or pain.  The patient denies non-productive cough, coughing up blood, chest pain, acid heartburn, loss of appetite, weight change, abdominal pain, sore throat, tooth/dental problems, headaches, sneezing, itching, ear ache, anxiety, depression, rash, change in color of mucus, and fever.    Vital Signs:  Patient profile:   70 year old female Weight:      184.38 pounds O2 Sat:      90 % on  Room air Temp:     98.3 degrees F oral Pulse rate:   97 / minute BP sitting:   96 / 64  (left arm) Cuff size:   large  Vitals Entered By: Vernie Murders (January 25, 2010 11:36 AM)  O2 Flow:  Room air  Serial Vital Signs/Assessments:  Comments: 12:36 PM Ambulatory Pulse Oximetry  Resting; HR__90___    02 Sat__92%RA___  Lap1 (100 feet only)  HR_117____   02 Sat_87%RA____  ___Test Completed without Difficulty _x__Test Stopped due XB:JYNWGN  desat.Returned to room sat. 90%,HR 120 at rest. Placed on 2L cont. at rest sat. 97%,HR 91. Walked 100 feet on 2 L cont. sat. dropped to 87%,HR 114.Returned to room sat. 90% on 2L at rest after 5 mins. waiting. MW notified. Elray Buba RN  January 25, 2010 12:44 PM  By: Elray Buba RN    Physical Exam  Additional Exam:  wt 181 > 184 January 25, 2010 elderly mod obese wf with thin legs pear shape body habitus nad at rest HEENT mild turbinate edema.  Oropharynx no thrush or excess pnd or cobblestoning.  No JVD or cervical adenopathy. Mild accessory muscle hypertrophy. Trachea midline, nl thryroid. Chest was hyperinflated by percussion with diminished breath sounds and marked increased exp time without wheeze. Hoover sign positive onset of inspiration. Regular rate and rhythm without murmur gallop or rub or increase P2. Decrease s1s2 Abd: no hsm, nl excursion. Ext warm without cyanosis or clubbing. No edema   Impression & Recommendations:  Problem # 1:  COPD (ICD-496) GOLD III/IV complicated by hypoxemia with minimal acitivity (> 50 ft) and actively smoking    DDX of  difficult airways managment all start with A and  include Adherence, Ace Inhibitors, Acid Reflux, Active Sinus Disease, Alpha 1 Antitripsin deficiency, Anxiety masquerading as Airways dz,  ABPA,  allergy(esp in young), Aspiration (esp in elderly), Adverse effects of DPI,  Active smokers, plus one B  = Beta blocker use..   Adherence to everything but 02 seems good> rec 24 h per day and return for pft's  Active smoking, see #2   Problem # 2:  TOBACCO ABUSE (ICD-305.1)  I emphasized that although we never turn away smokers from the pulmonary clinic, we do ask that they understand that the recommendations that were made won't work nearly as well in the presence of continued cigarette exposure and we may reach a point where we can't help the patient if he/she can't quit smoking.    Problem # 3:  CAROTID ARTERY STENOSIS  (ICD-433.10)  Her updated medication list for this problem includes:    Aspirin 81 Mg Tbec (Aspirin) .Marland Kitchen... Take one tablet by mouth daily  Her present symptoms are related to #4 but stenosis is severe per vasc surgery.  She would be very high risk for L CEA and if she does decide to proceed with need to understand and accept the risk and agree on a reasonable contingency in event of vent depence that vascular surgery agrees to implement - I doubt any kind of tune up (other than cig avoidance) will help much here at this point  Problem # 4:  HYPERTENSION (ICD-401.9) Overtreated at present, wean benicar. See instructions for specific recommendations   Her updated medication list for this problem includes:    Metoprolol Succinate 50 Mg Xr24h-tab (Metoprolol succinate) .Marland Kitchen... Take one tablet by mouth daily    Benicar Hct 20-12.5 Mg Tabs (Olmesartan medoxomil-hctz) ..... One half daily  Hydrochlorothiazide 25 Mg Tabs (Hydrochlorothiazide) .Marland Kitchen... 1 once daily as needed  Medications Added to Medication List This Visit: 1)  Benicar Hct 20-12.5 Mg Tabs (Olmesartan medoxomil-hctz) .... One half daily 2)  Benicar Hct 20-12.5 Mg Tabs (Olmesartan medoxomil-hctz) .Marland Kitchen.. 1 once daily 3)  Tylenol With Codeine #3 300-30 Mg Tabs (Acetaminophen-codeine) .Marland Kitchen.. 1 every 6 hrs as needed 4)  Hydrochlorothiazide 25 Mg Tabs (Hydrochlorothiazide) .Marland Kitchen.. 1 once daily as needed 5)  Travatan Z 0.004 % Soln (Travoprost) .Marland Kitchen.. 1 drop each eye once daily 6)  Ipratropium-albuterol 0.5-2.5 (3) Mg/33ml Soln (Ipratropium-albuterol) .Marland Kitchen.. 1 once daily as needed 7)  Symbicort 80-4.5 Mcg/act Aero (Budesonide-formoterol fumarate) .... 2 puffs two times a day 8)  Goodys Body Pain 500-325 Mg Pack (Aspirin-acetaminophen) .... As needed 9)  Oxygen 2.5 Liters  .... As needed with exertion  Other Orders: New Patient Level V (16109) Pulse Oximetry, Ambulatory (60454)  Patient Instructions: 1)  No smoking at all for 4 weeks on 02 2.5 lpm as  much as you can wear it 2)  Reduce the benicar to one half daily 3)  return in 4 weeks with PFT's and CXR  4)

## 2010-04-02 DIAGNOSIS — I319 Disease of pericardium, unspecified: Secondary | ICD-10-CM

## 2010-04-04 DIAGNOSIS — R000 Tachycardia, unspecified: Secondary | ICD-10-CM

## 2010-04-05 ENCOUNTER — Inpatient Hospital Stay (HOSPITAL_COMMUNITY)
Admission: RE | Admit: 2010-04-05 | Discharge: 2010-04-07 | DRG: 287 | Disposition: A | Discharge status: Home / Self Care | Payer: Medicare Other | Source: Other Acute Inpatient Hospital | Attending: Internal Medicine | Admitting: Internal Medicine

## 2010-04-05 DIAGNOSIS — I4729 Other ventricular tachycardia: Principal | ICD-10-CM | POA: Diagnosis present

## 2010-04-05 DIAGNOSIS — I472 Ventricular tachycardia, unspecified: Secondary | ICD-10-CM

## 2010-04-05 DIAGNOSIS — E785 Hyperlipidemia, unspecified: Secondary | ICD-10-CM | POA: Diagnosis present

## 2010-04-05 DIAGNOSIS — I2510 Atherosclerotic heart disease of native coronary artery without angina pectoris: Secondary | ICD-10-CM | POA: Diagnosis present

## 2010-04-05 DIAGNOSIS — J449 Chronic obstructive pulmonary disease, unspecified: Secondary | ICD-10-CM | POA: Diagnosis present

## 2010-04-05 DIAGNOSIS — Z7982 Long term (current) use of aspirin: Secondary | ICD-10-CM

## 2010-04-05 DIAGNOSIS — I10 Essential (primary) hypertension: Secondary | ICD-10-CM | POA: Diagnosis present

## 2010-04-05 DIAGNOSIS — H409 Unspecified glaucoma: Secondary | ICD-10-CM | POA: Diagnosis present

## 2010-04-05 DIAGNOSIS — F411 Generalized anxiety disorder: Secondary | ICD-10-CM | POA: Diagnosis present

## 2010-04-05 DIAGNOSIS — E876 Hypokalemia: Secondary | ICD-10-CM | POA: Diagnosis present

## 2010-04-05 DIAGNOSIS — I2582 Chronic total occlusion of coronary artery: Secondary | ICD-10-CM | POA: Diagnosis present

## 2010-04-05 DIAGNOSIS — J4489 Other specified chronic obstructive pulmonary disease: Secondary | ICD-10-CM | POA: Diagnosis present

## 2010-04-06 DIAGNOSIS — I2510 Atherosclerotic heart disease of native coronary artery without angina pectoris: Secondary | ICD-10-CM

## 2010-04-06 LAB — POCT I-STAT 3, ART BLOOD GAS (G3+)
O2 Saturation: 91 %
TCO2: 46 mmol/L (ref 0–100)

## 2010-04-06 LAB — POCT I-STAT 3, VENOUS BLOOD GAS (G3P V)
Bicarbonate: 42.4 mEq/L — ABNORMAL HIGH (ref 20.0–24.0)
Bicarbonate: 44.1 mEq/L — ABNORMAL HIGH (ref 20.0–24.0)
TCO2: 45 mmol/L (ref 0–100)
TCO2: 47 mmol/L (ref 0–100)
pCO2, Ven: 79.2 mmHg (ref 45.0–50.0)
pCO2, Ven: 79.7 mmHg (ref 45.0–50.0)
pH, Ven: 7.337 — ABNORMAL HIGH (ref 7.250–7.300)
pO2, Ven: 40 mmHg (ref 30.0–45.0)

## 2010-04-06 LAB — BASIC METABOLIC PANEL
BUN: 24 mg/dL — ABNORMAL HIGH (ref 6–23)
Calcium: 9.1 mg/dL (ref 8.4–10.5)
Chloride: 87 mEq/L — ABNORMAL LOW (ref 96–112)
Potassium: 4.1 mEq/L (ref 3.5–5.1)
Sodium: 138 mEq/L (ref 135–145)

## 2010-04-06 LAB — HEPARIN LEVEL (UNFRACTIONATED): Heparin Unfractionated: <0.1 IU/mL — ABNORMAL LOW (ref 0.30–0.70)

## 2010-04-06 LAB — CBC
Hemoglobin: 17.6 g/dL — ABNORMAL HIGH (ref 12.0–15.0)
MCHC: 32.4 g/dL (ref 30.0–36.0)
RDW: 13.6 % (ref 11.5–15.5)
WBC: 16 10*3/uL — ABNORMAL HIGH (ref 4.0–10.5)

## 2010-04-06 LAB — T3, FREE: T3, Free: 1.8 pg/mL — ABNORMAL LOW (ref 2.3–4.2)

## 2010-04-07 DIAGNOSIS — I472 Ventricular tachycardia: Secondary | ICD-10-CM

## 2010-04-07 LAB — BASIC METABOLIC PANEL
BUN: 21 mg/dL (ref 6–23)
CO2: 35 mEq/L — ABNORMAL HIGH (ref 19–32)
Calcium: 8.8 mg/dL (ref 8.4–10.5)
Chloride: 95 mEq/L — ABNORMAL LOW (ref 96–112)

## 2010-04-07 LAB — CBC
HCT: 53.3 % — ABNORMAL HIGH (ref 36.0–46.0)
MCHC: 32.5 g/dL (ref 30.0–36.0)
Platelets: 143 10*3/uL — ABNORMAL LOW (ref 150–400)
RDW: 13.4 % (ref 11.5–15.5)

## 2010-04-07 LAB — HEPARIN LEVEL (UNFRACTIONATED)
Heparin Unfractionated: 0.16 IU/mL — ABNORMAL LOW (ref 0.30–0.70)
Heparin Unfractionated: 0.53 IU/mL (ref 0.30–0.70)

## 2010-04-13 ENCOUNTER — Telehealth: Payer: Self-pay | Admitting: Cardiology

## 2010-04-18 ENCOUNTER — Encounter: Payer: Self-pay | Admitting: Cardiology

## 2010-04-18 ENCOUNTER — Other Ambulatory Visit: Payer: Medicare Other

## 2010-04-18 NOTE — Procedures (Signed)
[name] [name], [name] [name]                   ACCOUNT NO.:  [id number]  MEDICAL RECORD NO.:  [id number]           PATIENT TYPE:  I  LOCATION:  2911                         FACILITY:  [facility]  PHYSICIAN:  Arturo Morton. Riley Kill, MD, FACCDATE OF BIRTH:  1940/05/20  DATE OF PROCEDURE:  04/06/2010 DATE OF DISCHARGE:  04/07/2010                           CARDIAC CATHETERIZATION   INDICATIONS:  [name] [name] is a complex patient with multiple comorbidities. She is on home oxygen with severe COPD.  She was admitted with shortness of breath and had evidence of ventricular tachycardia.  She was seen by Dr. Ladona Ridgel and subsequently referred for right and left heart catheterization.  PROCEDURE: 1. Right and left heart catheterization. 2. Selective coronary arteriography. 3. Selective left ventriculography.  DESCRIPTION OF PROCEDURE:  The patient was brought to the cath lab, prepped and draped in the usual fashion.  Right heart catheterization was performed with thermodilution Swan-Ganz catheter with 7-French sheath.  Left heart catheterization was performed with 5-French catheters.  Overall, she tolerated the procedure well and there were no major complications.  The findings were really quite complex, and I reviewed the films with her husband.  Subsequently, I reviewed the films also with Dr. Excell Seltzer and I discussed the case with Dr. Jens Som.  She had a fair mount of buttock discomfort, but there were no major complications.  She was uncomfortable on the table.  HEMODYNAMIC DATA: 1. Central aortic pressure 142/82, mean 110. 2. LV pressure 139/12. 3. RA 7. 4. RV 60/7. 5. Pulmonary artery 60/29, mean 40. 6. Pulmonary capillary wedge 12. 7. Thermodilution cardiac output 4.79 liters per minute. 8. Thermodilution cardiac index 2.58 liters per minute per meter     squared. 9. Fick cardiac output 3.43 liters per minute. 10.Fick cardiac index 1.84 liters per minute per meter squared. 11.Superior vena cava  saturation 68%. 12.Pulmonary artery saturation 68%. 13.Aortic saturation 91%.  ANGIOGRAPHIC DATA: 1. On plain fluoroscopy, there is extensive calcification of     coronaries and heavy calcification of the aortic root. 2. Ventriculography.  The RAO projection reveals overall preserved     left ventricular systolic ejection fraction.  There is perhaps mild     anterolateral hypokinesis noted. 3. The left main is heavily calcified.  The left main itself is quite     short.  Although, the left main directly is not involved, there is     bifurcational disease involving the takeoff large circumflex as     well as the left anterior descending artery. 4. The left anterior descending artery is subtotally occluded near the     ostium.  There is vigorous filling into the diagonal system.  The     distal LAD appears to be occluded and fills in part by retrograde     collaterals perhaps from the diagonal system itself.  In another     view, there is antegrade filling of the distal left anterior     descending artery. 5. The circumflex is a large-caliber vessel.  In the very proximal     aspect of the circumflex vessel is what appears to be  a calcified     nodule.  There is antegrade flow past the nodule, but the nodule     appears to occupy probably about 70% of the proximal vessel.  The     distal vessel trifurcates into a marginal and AV circumflex.  There     is mild luminal irregularity, but no critical stenosis noted     distally. 6. The right coronary artery is a nondominant vessel that provides     small distal distribution and does not appear to have critical     disease.  CONCLUSION: 1. Pulmonary hypertension due to severe chronic obstructive pulmonary     disease. 2. Significant elevation in pCO2. 3. High-grade stenosis of the left anterior descending artery at the     ostium and high-grade calcified nodule involving the proximal     circumflex artery. 4. Ventricular  tachycardia.  DISPOSITION:  I have reviewed the films with Dr. Excell Seltzer.  Given her comorbidity, she is a very high risk for percutaneous intervention.  She does not appear to be good surgical candidate.  I have spoken with the patient and she is not very enthusiastic about any type of procedures especially with a very high risk.  PLAN:  To review her films with Dr. Jens Som on Monday to discuss possible options.     Arturo Morton. Riley Kill, MD, [facility]     TDS/MEDQ  D:  04/11/2010  T:  04/12/2010  Job:  [id number]  cc:   Doylene Canning. Ladona Ridgel, MD Madolyn Frieze. Jens Som, MD, [facility] Learta Codding, MD,FACC  Electronically Signed by Shawnie Pons MD [facility] on 04/18/2010 09:10:31 PM

## 2010-04-18 NOTE — Progress Notes (Signed)
Summary: blood work  Phone Note Call from Patient Call back at [employer] [phone]   Caller: Patient Reason for Call: Talk to Nurse Summary of Call: please fax blood work orders to [facility] fax# [phone].   Initial call taken by: Roe Coombs,  April 13, 2010 1:20 PM  Follow-up for Phone Call        04/13/10--1345--pt calling stating wants to go to [facility] center in [place] for blood work, but no one sent them an order--advised i sent order to [facility] center and she can go any time to have blood work drawn(BMET) --pt stated she would go on monday 2/20--asked that results be faxed back to dr crenshaw--nt Follow-up by: Ledon Snare, RN,  April 13, 2010 1:45 PM

## 2010-04-23 ENCOUNTER — Encounter: Payer: Self-pay | Admitting: Physician Assistant

## 2010-04-23 ENCOUNTER — Encounter (INDEPENDENT_AMBULATORY_CARE_PROVIDER_SITE_OTHER): Payer: Medicare Other | Admitting: Physician Assistant

## 2010-04-23 ENCOUNTER — Other Ambulatory Visit: Payer: Self-pay | Admitting: Physician Assistant

## 2010-04-23 DIAGNOSIS — I2510 Atherosclerotic heart disease of native coronary artery without angina pectoris: Secondary | ICD-10-CM | POA: Insufficient documentation

## 2010-04-23 DIAGNOSIS — E876 Hypokalemia: Secondary | ICD-10-CM | POA: Insufficient documentation

## 2010-04-23 DIAGNOSIS — I472 Ventricular tachycardia: Secondary | ICD-10-CM | POA: Insufficient documentation

## 2010-04-23 LAB — BASIC METABOLIC PANEL
BUN: 10 mg/dL (ref 6–23)
Calcium: 9 mg/dL (ref 8.4–10.5)
Chloride: 104 mEq/L (ref 96–112)
Creatinine, Ser: 0.5 mg/dL (ref 0.4–1.2)
GFR: 146.95 mL/min (ref >60.00)
Potassium: 4.8 mEq/L (ref 3.5–5.1)
Sodium: 139 mEq/L (ref 135–145)

## 2010-04-25 ENCOUNTER — Telehealth: Payer: Self-pay | Admitting: Cardiology

## 2010-05-02 ENCOUNTER — Encounter: Payer: Self-pay | Admitting: Cardiology

## 2010-05-03 ENCOUNTER — Encounter: Payer: Self-pay | Admitting: Cardiology

## 2010-05-03 NOTE — Assessment & Plan Note (Signed)
Summary: eph.per pt call.gd   Visit Type:  Follow-up Referring Provider:  Dr. Derl Barrow Primary Provider:  Dr. Joette Catching  CC:  sob and irregular heart beat.  History of Present Illness: Primary Cardiologist:  Dr. Olga Millers  [name] [name] is a 70 year old female with a history of hypertension, hyperlipidemia, SVT, COPD, bladder cancer and cerebrovascular disease.  She had been evaluated recently for upcoming carotid endarterectomy.  Myoview study in December 2011 was negative for ischemia and her EF was 68%.  She presented to [facility] in early February for a COPD exacerbation and possible diastolic heart failure.  She was noted to have wide complex tachycardia that was felt to be ventricular tachycardia and she was transferred to [facility].  Cardiac catheterization on 04/06/10 demonstrated a totally occluded LAD with right to left collaterals, a nodular density in the prox CFX that occupied about 70% of the prox vessel, and preserved LVF.  It was felt that her CAD should be treated medically.  She was evaluated by Dr. Ladona Ridgel.  It is felt that her ventricular tachycardia would also be treated medically with adjustments in her beta blocker therapy.  She returns today for post hospital followup.  Overall, she is doing well.  She's noted a couple of episodes of palpitations.  These lasted about 10 minutes.  She denies associated chest pain or lightheadedness.  She denies orthopnea, PND or pedal edema.  She denies significant shortness of breath with exertion.  Her cough is improved.  We were completing her visit and getting ready to discharge her.  However, she began to note rapid palpitations.  We performed an EKG and she was in SVT at a heart rate of 120.  I discussed her case with Dr. Jens Som.  Her blood pressure was 80/70.  She denied any lightheadedness, dizziness or near syncope.  We decided to push adenosine 6 mg IV.  We also gave her 1 L of IV fluids.  Her rhythm  converted to normal sinus rhythm/sinus tachycardia.  Her pressure finally came up to 90/70.  She felt well and denied any lightheadedness or dizziness.  Current Medications (verified): 1)  Aspirin 81 Mg Tbec (Aspirin) .... Take One Tablet By Mouth Daily 2)  Lipitor 80 Mg Tabs (Atorvastatin Calcium) .... Take One Tablet By Mouth Daily. 3)  Metoprolol Succinate 25 Mg Xr24h-Tab (Metoprolol Succinate) .... Take One Two Times A Day 4)  Tylenol With Codeine #3 300-30 Mg Tabs (Acetaminophen-Codeine) .Marland Kitchen.. 1 Every 6 Hrs As Needed 5)  Hydrochlorothiazide 25 Mg Tabs (Hydrochlorothiazide) .Marland Kitchen.. 1 Once Daily As Needed 6)  Travatan Z 0.004 % Soln (Travoprost) .Marland Kitchen.. 1 Drop Each Eye Once Daily 7)  Ipratropium-Albuterol 0.5-2.5 (3) Mg/80ml Soln (Ipratropium-Albuterol) .Marland Kitchen.. 1 Once Daily As Needed 8)  Symbicort 80-4.5 Mcg/act Aero (Budesonide-Formoterol Fumarate) .... 2 Puffs Two Times A Day 9)  Goodys Body Pain 500-325 Mg Pack (Aspirin-Acetaminophen) .... As Needed 10)  Oxygen 2.5 Liters .... As Needed With Exertion 11)  Klor-Con M20 20 Meq Cr-Tabs (Potassium Chloride Crys Cr) .... Take Two Times A Day 12)  Amlodipine Besylate 5 Mg Tabs (Amlodipine Besylate) .... Take One Daily  Allergies (verified): No Known Drug Allergies  Past History:  Past Medical History: SUPRAVENTRICULAR TACHYCARDIA (ICD-427.89) HYPERLIPIDEMIA (ICD-272.4) HYPERTENSION (ICD-401.9) CEREBROVASCULAR DISEASE (ICD-437.9)      - 80% R Carotid stenosis - see note by Bradham11/16/11 GLAUCOMA (ICD-365.9) OBESITY (ICD-278.00) COPD (ICD-496)      - PFT's [id number]  FEV1   .85(37%) ratio 47 and  11 % better p B2, DLC0 42%      - Walking sats January 25, 2010 > desat even on 2lpm > rec 2.5 lpm 24 h per day  CAD    a.  Cath 04/06/10: LAD occluded with R-L collats; med Rx WCT . . . probable V. Tach . . . med Rx  Review of Systems       As per  the HPI.  All other systems reviewed and negative.   Vital Signs:  Patient profile:   70 year old  female Height:      65 inches Weight:      168 pounds Pulse rate:   80 / minute Pulse rhythm:   regular BP sitting:   106 / 70  (right arm)  Vitals Entered By: Jacquelin Hawking, CMA (April 23, 2010 10:05 AM)  Physical Exam  General:  Well nourished, well developed, in no acute distress HEENT: normal Neck: no JVD Cardiac:  normal S1, S2; RRR; no murmur Lungs:  decreased breath sounds bilat.; no wheezing Abd: soft, nontender, no hepatomegaly Ext: no edema; RFA site without hematoma or bruit Skin: warm and dry Neuro:  CNs 2-12 intact, no focal abnormalities noted Endo: no thyromegaly   EKG  Procedure date:  04/23/2010  Findings:      Normal sinus rhythm Heart rate 85 Normal axis Low-voltage Poor R-wave progression No ischemic changes  Impression & Recommendations:  Problem # 1:  SUPRAVENTRICULAR TACHYCARDIA (ICD-427.89) We were completing her visit and getting ready to discharge her.  However, she began to note rapid palpitations.  We performed an EKG and she was in SVT at a heart rate of 120.  I discussed her case with Dr. Jens Som.  Her blood pressure was 80/70.  She denied any lightheadedness, dizziness or near syncope.  We decided to push adenosine 6 mg IV.  We also gave her 1 L of IV fluids.  Her rhythm converted to normal sinus rhythm/sinus tachycardia.  Her pressure finally came up to 90/70.  She felt well and denied any lightheadedness or dizziness.  She spent over 2 hours in the office while being monitored and treated.  Her amlodipine will be d/c'd and her metoprolol will be changed to 50 mg in the AM and 25 mg in the PM.  She will follow up with Dr. Jens Som in 3-4 weeks.  ? if she would be a candidate for SVT ablation.  I believe this has been considered in the past.  Orders: EKG w/ Interpretation (93000)  Problem # 2:  HYPOKALEMIA (ICD-276.8) She was hypokalemic in the hospital and is now on K+.  Will check a bmet today.  Orders: TLB-BMP (Basic Metabolic  Panel-BMET) (80048-METABOL)  Problem # 3:  TOBACCO ABUSE (ICD-305.1) She knows to quit.  Problem # 4:  CAROTID ARTERY STENOSIS (ICD-433.10) Evaluated by vascular surgery in the past.    Her updated medication list for this problem includes:    Aspirin 81 Mg Tbec (Aspirin) .Marland Kitchen... Take one tablet by mouth daily  Problem # 5:  CAD (ICD-414.00) No angina.  She is treated medically.  Continue ASA and statin.  Problem # 6:  VENTRICULAR TACHYCARDIA (ICD-427.1) Preserved LVF.  Continue medical therapy.  Problem # 7:  HYPERTENSION (ICD-401.9) Changes as above.  I stopped her amlodipine as her BP was low in the office and she will likely not need this.  Patient Instructions: 1)  Your physician has recommended you make the following change in your medication:  2)  Stop taking Amlodipine. 3)  Increase Metoprolol Succinate  to 50  take 1 tablet every morning and 1/2 tablet every evening. 4)  You can take 2 tablets of the 25 mg to make 50 mg . . . so you can take 2 of the 25 mg tabs in the morning and 2 tabs of the 25 mg in the evening.   5)  Your physician recommends that you return for lab work in: Today BMET 276.8. 6)  Your physician recommends that you schedule a follow-up appointment in: 3-4 weeks with Dr. Jens Som as per Tereso Newcomer, PA-C. 7)  You have been referred to have an EKG and BP check with your Primary Care Physcian Dr. Denzil Magnuson in 1 week after making the medication changes as per Tereso Newcomer, PA-C. Prescriptions: METOPROLOL SUCCINATE 50 MG XR24H-TAB (METOPROLOL SUCCINATE) 1 tab every morning and 1/2 tab every evening  #60 x 11   Entered by:   Danielle Rankin, CMA   Authorized by:   Tereso Newcomer PA-C   Signed by:   Danielle Rankin, CMA on 04/23/2010   Method used:   Print then Give to Patient   RxID:   [id number] METOPROLOL SUCCINATE 50 MG XR24H-TAB (METOPROLOL SUCCINATE) 1 tab two times a day  #60 x 11   Entered by:   Danielle Rankin, CMA   Authorized by:   Tereso Newcomer PA-C   Signed  by:   Danielle Rankin, CMA on 04/23/2010   Method used:   Print then Give to Patient   RxID:   [id number] METOPROLOL SUCCINATE 50 MG XR24H-TAB (METOPROLOL SUCCINATE) 1 tab two times a day  #60 x 11   Entered by:   Danielle Rankin, CMA   Authorized by:   Ferman Hamming, MD, [facility]   Signed by:   Danielle Rankin, CMA on 04/23/2010   Method used:   Print then Give to Patient   RxID:   [id number]  I have personally reviewed the prescriptions today for accuracy.Tereso Newcomer PA-C  April 23, 2010 2:01 PM

## 2010-05-03 NOTE — Progress Notes (Signed)
Summary: MEDICATION CHANGES  Phone Note Call from Patient Call back at Home Phone [phone]   Caller: Patient Reason for Call: Talk to Nurse, Talk to Doctor Summary of Call: PT HAS QUESTIONS REGARDING MEDICATION CHANGES Initial call taken by: Omer Jack,  April 25, 2010 11:43 AM  Follow-up for Phone Call        spoke with pt, her metoprolol was increased to 50mg  in the am and 25mg  in the pm. she states she is unable to tolerate because her bp is dropping too low. today it is 107 and she feels weak and thinks that is too much. she wants to decrease to 25mg . will foward to dr Jens Som to review Deliah Goody, RN  April 25, 2010 3:04 PM   Additional Follow-up for Phone Call Additional follow up Details #1::        resume metoprolol at 25 mg by mouth two times a day Ferman Hamming, MD, [facility]  April 25, 2010 3:36 PM  pt aware Deliah Goody, RN  April 25, 2010 4:34 PM

## 2010-05-08 ENCOUNTER — Encounter (INDEPENDENT_AMBULATORY_CARE_PROVIDER_SITE_OTHER): Payer: Self-pay | Admitting: *Deleted

## 2010-05-09 NOTE — Discharge Summary (Signed)
[name] [name], SCHWOERER                   ACCOUNT NO.:  [id number]  MEDICAL RECORD NO.:  [id number]           PATIENT TYPE:  I  LOCATION:  2911                         FACILITY:  [facility]  PHYSICIAN:  Doylene Canning. Ladona Ridgel, MD    DATE OF BIRTH:  1940-10-08  DATE OF ADMISSION:  04/05/2010 DATE OF DISCHARGE:  04/07/2010                              DISCHARGE SUMMARY   CARDIOLOGIST:  Madolyn Frieze. Jens Som, MD, [facility]  ELECTROPHYSIOLOGIST:  Doylene Canning. Ladona Ridgel, MD  REASON FOR ADMISSION:  Wide complex tachycardia -- probable ventricular tachycardia.    DISCHARGE DIAGNOSES: 1. Ventricular tachycardia -- medical therapy. 2. Coronary artery disease     a.     Totally occluded left anterior descending by cardiac      catheterization this admission -- not a candidate for percutaneous      coronary intervention or coronary artery bypass grafting --      medical therapy pursued. 3. Good left ventricular function by Myoview study in December 2011     with an EF of 68% and noted apical defect, but no ischemia. 4. Severe chronic obstructive pulmonary disease. 5. History of supraventricular tachycardia. 6. Hypertension. 7. Hyperlipidemia. 8. Cerebrovascular accident disease. 9. Abdominal aortic aneurysm 3.5 cm in September 2011. 10.Glaucoma. 11.Hypokalemia.  PROCEDURE PERFORMED:  During this admission, cardiac catheterization by Dr. Arturo Morton. Stuckey, on April 06, 2010:  Wedge pressure 12. Cardiac output 4.7 on cardiac index 3.43, left circumflex with calcification, RCA without stenosis, LAD subtotally occluded fills distally by right-to-left collaterals -- medical therapy pursued.  ADMISSION HISTORY:  [name] [name] is a 70 year old female patient who recently saw Dr. Jens Som for evaluation prior to carotid endarterectomy.  She had a prior nonischemic Myoview study with normal LV function.   She presented to the emergency room at [facility] on March 31, 2010, with shortness of breath.  She was treated  for COPD exacerbation. She was also given Lasix for possible congestive heart failure.  Her cardiac enzymes were normal.  Telemetry demonstrated episodes of wide complex tachycardia with a rate to 180-200.  Our service saw her at [facility] and eventually decided to transfer her to [facility] for evaluation and treatment to include cardiac catheterization and EP evaluation.  HOSPITAL COURSE:  The patient underwent cardiac catheterization on April 06, 2010 as noted above.  Dr. Riley Kill felt that she was not a good surgical candidate.  The length of her lesion in LAD was also too long to be amenable for PCI.  Medical therapy was pursued.  Dr. Ladona Ridgel also saw the patient.  Regarding her probable ventricular tachycardia, there were no good options for treatment.  Therefore, medical therapy was pursued.  The patient was evaluated by Dr. Ladona Ridgel on April 07, 2010.  It was though she was stable enough for discharge to home.  Home oxygen was arranged for her.  She was to be continued on beta-blocker therapy.  Of note, she did have hypokalemia upon admission. She was transferred on a dose of potassium 20 mEq twice a day.  She has been getting that consistently with normal potassium  levels.  She will have a followup basic metabolic panel within the next week.  She will also have followup with Dr. Jens Som in the next 1-2 weeks.  LABORATORY DATA:  Hemoglobin 17.3, potassium 4.3, BUN 21, creatinine 0.51, and free T3 1.8.  DISCHARGE MEDICATIONS:  It should be noted that her exact home list is not known at the time of this dictation.  Based upon the record we have from [facility], this was her medication list that stands right now, 1. Atorvastatin 40 mg at bedtime. 2. Amlodipine 5 mg daily. 3. Aspirin 325 mg daily. 4. Symbicort 2 puffs b.i.d. 5. Metoprolol succinate 25 mg b.i.d. 6. Potassium 20 mEq b.i.d.  ALLERGIES:  No known drug allergies.  ACTIVITY:  She is to increase  activity slowly.  DIET:  Low-fat, low-sodium diet.  WOUND CARE:  She should call for any swelling, bleeding, bruising, or fever.  FOLLOWUP: 1. We will get her in to see Dr. Jens Som in the next 1-2 weeks for     follow-up. 2. She will have a basic metabolic panel early next week to follow up     on hypokalemia.  The office will contact her with an appointment.  TIME SPENT:  Total physician and PA time greater than 30 minutes on this discharge.     Tereso Newcomer, PA-C   ______________________________ Doylene Canning. Ladona Ridgel, MD    SW/MEDQ  D:  04/07/2010  T:  04/07/2010  Job:  [id number]  cc:   Madolyn Frieze. Jens Som, MD, [facility]  Electronically Signed by Tereso Newcomer PA-C on 04/23/2010 08:48:46 AM Electronically Signed by Lewayne Bunting MD on 05/09/2010 04:35:11 PM

## 2010-05-15 ENCOUNTER — Ambulatory Visit: Payer: Medicare Other | Admitting: Cardiology

## 2010-05-15 NOTE — Letter (Signed)
Summary: Appointment - Reschedule  [facility], Main Office  [address]   [place], [place] [address]   Phone: [phone]  Fax: [phone]          May 08, 2010 MRN: [id number]      [name] [name] [address] [place], [place]  [address]     Dear Ms. Laredo,   Due to a change in our office schedule, your appointment on March 20. 2012 at 11:30 am must be changed.  It is very important that we reach you to reschedule this appointment. We look forward to participating in your health care needs. Please contact us at the number listed above at your earliest convenience to reschedule this appointment.     Sincerely,  Roe Coombs  [facility] Scheduling Team

## 2010-06-06 LAB — POCT I-STAT 4, (NA,K, GLUC, HGB,HCT)
Glucose, Bld: 90 mg/dL (ref 70–99)
Potassium: 4.2 mEq/L (ref 3.5–5.1)

## 2010-06-19 ENCOUNTER — Ambulatory Visit: Payer: Medicare Other | Admitting: Cardiology

## 2010-06-19 ENCOUNTER — Encounter: Payer: Self-pay | Admitting: Cardiology

## 2010-06-29 ENCOUNTER — Ambulatory Visit: Payer: Medicare Other | Admitting: Cardiology

## 2010-07-10 ENCOUNTER — Ambulatory Visit (INDEPENDENT_AMBULATORY_CARE_PROVIDER_SITE_OTHER): Payer: Medicare Other | Admitting: Cardiology

## 2010-07-10 ENCOUNTER — Encounter: Payer: Self-pay | Admitting: Cardiology

## 2010-07-10 DIAGNOSIS — I10 Essential (primary) hypertension: Secondary | ICD-10-CM

## 2010-07-10 DIAGNOSIS — I472 Ventricular tachycardia, unspecified: Secondary | ICD-10-CM

## 2010-07-10 DIAGNOSIS — F17200 Nicotine dependence, unspecified, uncomplicated: Secondary | ICD-10-CM

## 2010-07-10 DIAGNOSIS — I2510 Atherosclerotic heart disease of native coronary artery without angina pectoris: Secondary | ICD-10-CM

## 2010-07-10 DIAGNOSIS — I6529 Occlusion and stenosis of unspecified carotid artery: Secondary | ICD-10-CM

## 2010-07-10 DIAGNOSIS — E785 Hyperlipidemia, unspecified: Secondary | ICD-10-CM

## 2010-07-10 DIAGNOSIS — I498 Other specified cardiac arrhythmias: Secondary | ICD-10-CM

## 2010-07-10 DIAGNOSIS — I714 Abdominal aortic aneurysm, without rupture, unspecified: Secondary | ICD-10-CM

## 2010-07-10 MED ORDER — NITROGLYCERIN 0.4 MG SL SUBL: 0.4000 mg | SUBLINGUAL_TABLET | SUBLINGUAL | Status: DC | PRN

## 2010-07-10 NOTE — Assessment & Plan Note (Signed)
[facility]                            CARDIOLOGY OFFICE NOTE   NAME:[name] [name], [name] [name]                          MRN:          [id number]  DATE:10/30/2006                            DOB:          09/14/1940    I was asked by Dr. Boston Service to consult on [name] [name] for cardiac  clearance of cystoscopy, retrograde urethrogram, and a TURBT.   [name] [name] is a patient of Dr. Ludwig Clarks but has not been evaluated in  the office in over four years.   HISTORY OF PRESENT ILLNESS:  She is a 70 year old white female who has a  diagnosis of severe COPD from cigarette use, also a patient of Dr.  Thurston Hole, who carries a cardiac diagnosis of supraventricular tachycardia  as well as hypertension.   Her preop EKG demonstrated possible atrial fibrillation but on careful  review, it shows sinus tachycardia with PACs.  She has significant  dyspnea on exertion which is secondary to lung disease.  She saw Dr.  Sandrea Hughs on October 14, 2006, and he cleared her for surgery.  His  only recommendation was for her to stop smoking for two weeks and to  begin Mucinex DM and Combivent and maintenance Symbicort.   She has no other cardiac complaints today.  She says she has been under  a lot of stress and has been doing a lot today.   PAST MEDICAL HISTORY:  1. Remote right lung surgery.  2. Bilateral tubal ligation.  3. Glaucoma.  4. Severe COPD.  5. Hypertension.  6. Hyperlipidemia.  7. Glaucoma.  8. Hypertension.   She has no known drug allergies.   CURRENT MEDICATIONS:  1. Benicar 20/HCTZ 12.5 daily.  2. Lipitor 40 mg a day.  3. Mobic 1 daily.  4. Symbicort 160/4.5 two puffs b.i.d.  5. Baby aspirin daily.  6. She is also on Combivent.  7. She is also listed today as being on Toprol XL 50 mg a day.  8. Travatan eye drops.   SOCIAL HISTORY:  She continues to smoke a pack and a half a day.  She  has done so for 50+ years.  She has worked in the [employer]   FAMILY HISTORY:  Noncontributory.   REVIEW OF SYSTEMS:  Taken in detail and negative except as outlined  above.   PHYSICAL EXAMINATION:  She is a pleasant lady in no acute distress.  She has a blood pressure of 120/68.  Her pulse is 108.  Her EKG shows  sinus tachycardia.  Her weight is 185 pounds.  Respiratory rate is 22  and slightly labored.  She has some audible wheezing and rhonchi.  HEENT:  Normocephalic and atraumatic.  PERRLA.  Extraocular movements  are intact.  Sclerae are clear.  Facial symmetry is normal.  NECK:  Carotids are full.  No JVD.  Thyroid is not enlarged.  Trachea is  midline.  LUNGS:  Inspiratory and expiratory rhonchi and wheezes.  HEART:  A poorly appreciated PMI, a rapid rate and rhythm, normal S1 and  S2.  ABDOMEN:  Soft  with good bowel sounds.  EXTREMITIES:  No edema.  Pulses are intact.  NEUROLOGIC:  Intact.  SKIN:  Unremarkable.   I repeated her EKG in the office today, and it shows sinus tachycardia  with a rightward axis.   ASSESSMENT:  Sinus tachycardia secondary to anxiety, increased stress,  and severe lung disease with ongoing tobacco use.   RECOMMENDATIONS:  1. Continue current medications and be sure and take Toprol the day of      the surgery.  2. I have given cardiac clearance to her for surgery.  I think her      greatest risk is her lungs.  We will be glad to see her in      consultation during the perioperative period.     Thomas C. Daleen Squibb, MD, [facility]  Electronically Signed    TCW/MedQ  DD: 10/30/2006  DT: 10/30/2006  Job #: [id number]   cc:   Boston Service, M.D.

## 2010-07-10 NOTE — Op Note (Signed)
[name] [name], [name] [name]                   ACCOUNT NO.:  [id number]   MEDICAL RECORD NO.:  [id number]          PATIENT TYPE:  AMB   LOCATION:  DAY                          FACILITY:  [facility]   PHYSICIAN:  Boston Service, M.D.DATE OF BIRTH:  04/18/1940   DATE OF PROCEDURE:  11/11/2006  DATE OF DISCHARGE:                               OPERATIVE REPORT   PREOPERATIVE DIAGNOSIS:  Arteriosclerotic coronary artery disease,  history of atrial fibrillation, chronic obstructive pulmonary disease,  transitional cell carcinoma.   POSTOPERATIVE DIAGNOSIS:  Arteriosclerotic coronary artery disease,  history of atrial fibrillation, chronic obstructive pulmonary disease,  transitional cell carcinoma.   PROCEDURE:  Transurethral resection of bladder tumor, large, cystoscopy  and retrogrades.   SURGEON:  Boston Service, M.D.   ASSISTANT:  None.   ANESTHESIA:  General.   FINDINGS:  Large bladder tumor occupying much of the right wall of the  bladder.   SPECIMENS:  Bladder tumor, both superficial and deep, disposed of to  pathology.   FINDINGS:  Large bladder tumor.   ESTIMATED BLOOD LOSS:  Minimal.   COMPLICATIONS:  None obvious.   DESCRIPTION OF PROCEDURE:  The patient was prepped and draped in the  dorsal lithotomy position after institution of an adequate level of  general anesthesia.  I greatly appreciate the preoperative evaluation  with internal medicine, Dr. Lysbeth Galas, cardiology, Dr. Daleen Squibb, and pulmonary  medicine, Dr. Sherene Sires.  The cystoscope was used to carefully inspect the  bladder. A large papillary lesion was identified occupying much of the  right wall of the bladder.  The left orifice was easily located, blocked  with a cone tip catheter, retrogrades were performed, and showed normal  course and caliber of the ureter, pelvis and calyces.  The right orifice  was identified with some difficulty, blocked with a cone tip catheter.  With injection of 3-6 mL of contrast, there was no  evidence of filling  defect or obstruction.  The tumor appeared to originate behind the right  ureteral orifice.   The VaporTrode element was then used to carefully work along what  appeared to be the base of the tumor in hopes of obtaining good  hemostasis. Over a period of probably 25-35 minutes, the base of the  tumor was coagulated, the papillary portion superiorly, then lifted off,  and was resected with the loop in a relatively avascular fashion.  The  tissue was then irrigated from the bladder using the Microvasive  irrigation device.  The resectoscope sheath was inserted.  Adequate  hemostasis was obtained along what had been the stalk of the tumor. Deep  biopsies of the underlying muscle were taken x6.  The VaporTrode element  was used to obtain adequate hemostasis. During the period of the  resection, a straight ureteral catheter had been left indwelling in the  right ureter in order to avoid injury to the intramural ureter.  The  resectoscope sheath was then withdrawn.  A 24 French Foley 5 mL balloon  was inserted.  The bladder was then carefully irrigated with sterile  water for a period of about 15-20  minutes.  The catheter was left to  straight drain.  The patient was given a B&O suppository and returned to  recovery in satisfactory condition.           ______________________________  Boston Service, M.D.     RH/MEDQ  D:  11/11/2006  T:  11/11/2006  Job:  [id number]   cc:   Casimiro Needle B. Sherene Sires, MD, FCCP  [address]  [place] [address]   Jesse Sans. Wall, MD, FACC  [address]  [address]  [place]  [place] [address]   Delaney Meigs, M.D.  Fax: [phone]

## 2010-07-10 NOTE — Assessment & Plan Note (Signed)
Blood pressure controlled with present medications. Will continue. 

## 2010-07-10 NOTE — Assessment & Plan Note (Signed)
Continue statin. Lipids and liver monitored by primary care. 

## 2010-07-10 NOTE — Assessment & Plan Note (Signed)
[facility]                             PULMONARY OFFICE NOTE   NAME:[name] [name], [name] [name]                          MRN:          [id number]  DATE:10/14/2006                            DOB:          [birth year]    HISTORY OF PRESENT ILLNESS:  This is a pulmonary consultation requested  by Dr. Wanda Plump.   REASON FOR CONSULTATION:  Pre-op pulmonary clearance.   HISTORY OF PRESENT ILLNESS:  This is a 70 year old white female long-  term smoker with FEV1 baseline of around 37% predicted with 11%  improvement after bronchodilators documented in 2004.  At that point she  weighed 194 pounds and comes in now at 188 pounds for consideration for  bladder surgery.  I understand she is going to require general  anesthesia for cystoscopy but no abdominal surgery.   Presently she uses handicapped parking but more because her right knee  gives out before her wind does.  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  She is able to  sleep with head elevated at about 30 degrees with about 5-20 minutes of  heavy chest congestion in the morning which finally clears.  She  denies any variability in terms of these symptoms, orthopnea, PND or leg  swelling, fever, chills, sweats, chest pain.  She does report overall  better since Symbicort was started last year.   PAST MEDICAL HISTORY:  1. Remote right lung surgery, unclear what was done at that time.  2. Bilateral tubal ligation.  3. Hypertension.  4. Glaucoma.  5. COPD.  6. Atypical chest pain status post left heart catheterization negative      in 1997.  7. Hypertension.  8. Hyperlipidemia.   ALLERGIES:  None known.   MEDICATIONS:  1. Benicar 20/12.5 one daily.  2. Lipitor 40 mg one daily.  3. Mobic one daily.  4. Symbicort 160/4.5 2 puffs b.i.d.  5. Baby aspirin daily.  6. She also has Combivent to use 2 puffs q.4 but has poor MDI      technique.   SOCIAL HISTORY:  She  continues to smoke up to a pack and a half per day.  Has done so for 50 years or so.  She has worked in the [employer].  Denies any unusual exposure history.   FAMILY HISTORY:  Positive for emphysema in her father who was a smoker,  asthma also in her mother.  Negative for any form of cancer to her  knowledge.   REVIEW OF SYSTEMS:  Taken in detail, negative except as outlined above.   PHYSICAL EXAMINATION:  GENERAL:  This is an obese white female with  somewhat of a congestive cough which she says is her norm, and is much  better than what she wakes up with every morning.  VITAL SIGNS:  She is afebrile, weight 188 pounds.  HEAD AND NECK:  Unremarkable.  Oropharynx clear.  No excessive postnasal  drainage or cobblestoning .  Ear canals clear bilaterally.  Neck supple  without cervical adenopathy or tenderness.  Trachea midline .  LUNGS:  Lung fields reveal diminished breath sounds bilaterally with end  expiratory wheeze only.  HEART:  There is a regular rhythm without murmur, gallop or rub, S1, S2  were diminished.  ABDOMEN:  Massively obese, __________  with negative Hoover's sign.  However, excursion was poor in the supine position.  EXTREMITIES:  Warm without any calf tenderness, cyanosis, clubbing or  edema.   Heme saturation is on 91% on room air.   PFT's were performed today and compared to previous studies and show an  FEV1 of only 0.68 which is 28% of predicted.   IMPRESSION:  This patient has severe chronic obstructive pulmonary  disease and also obesity with deconditioning which is not likely to  improve by delaying surgery given the fact that she is more limited by  her knees than her breathing.  It is critical, however, that she accept  a higher risk of surgery or commit to stop smoking at least in the short  term to be cleared for surgery.   She is therefore cleared for surgery with the following caveats:   1. Stop smoking for two weeks preoperatively.   2. Mucinex DM to be upped to two b.i.d. p.r.n. coughing, congestion.  3. Combivent 2 puffs every four hours p.r.n. wheezing plus maintenance      Symbicort 160/4.5 2 puffs b.i.d.  When I reviewed MDI technique      with her, it improved from about 30% effective to 60% or so, and I      believe this should get her over the hump for cystoscopy with      general      anesthesia, but luckily she will not need any abdominal surgery at      this point because she really would not be a satisfactory      candidate.     Charlaine Dalton. Sherene Sires, MD, [facility]  Electronically Signed    MBW/MedQ  DD: 10/14/2006  DT: 10/15/2006  Job #: [id number]   cc:   Delaney Meigs, M.D.  [place] Service, M.D.

## 2010-07-10 NOTE — Assessment & Plan Note (Signed)
[facility]                            CARDIOLOGY OFFICE NOTE   NAME:[name], [name] REHMANN                          MRN:          [id number]  DATE:03/20/2007                            DOB:          [birth year]    HISTORY:  [name] [name] is a pleasant female I have not seen since February  2004.  She has had a previous cardiac catheterization in 1997 that  showed no obstructive coronary disease.  She had a Myoview performed on  October 08, 2001.  This showed no ischemia or infarction and her ejection  fraction 71%.  She had her most recent echocardiogram in March 2004 that  showed normal LV function and mild left atrial enlargement as well as  mild right atrial enlargement.  When I last saw her on April 07, 2002, she was having what sounded to be an SVT.  We scheduled her to  have an event monitor.  This showed an SVT and rate was in the 158-171  range.  She did see [name] [name] in followup and her Toprol was increased,  but I have not seen her since.  She was recently seen by Dr. Daleen [name] prior  to having cystopic surgery for a bladder tumor.  Since her surgery, she  has done reasonably well.  She has chronic dyspnea on exertion.  There  is no orthopnea, PND or pedal edema.  She does not have exertional chest  pain.  However, she continues to have episodes of her heart racing.  This is sudden in onset.  It can last anywhere from an hour to several  days.  She does feel discomfort in her chest with those episodes.  They  are  sudden in onset and offset.   MEDICATIONS:  1. Benicar 20/12.5 daily.  2. Lipitor 40 mg p.o. daily.  3. Symbicort.  4. Aspirin 81 mg p.o. daily.  5. Toprol 50 mg p.o. daily.  6. Eye drops.  7. Ciprofloxacin.   PHYSICAL EXAMINATION:  VITAL SIGNS:  Blood pressure 130/80, pulse 103,  she weighs 191 pounds.  HEENT:  Normal.  NECK:  Supple.  CHEST:  Clear.  CARDIOVASCULAR:  Regular rate and rhythm.  ABDOMEN:  No tenderness.  EXTREMITIES:  No  edema.   DIAGNOSTICS:  Electrocardiogram shows a sinus rhythm at a rate of 103.  There are no ST changes noted.   DIAGNOSES:  1. Supraventricular tachycardia.  [name] [name] continues to have recurrent      episodes despite beta blockade therapy.  I have not seen her in 5      years.  We discussed proceeding with EP evaluation for ablation and      she is interested in doing this and we will arrange.  I will repeat      her echocardiogram to reassess her LV function and also check a      TSH.  She will continue on her aspirin and beta blocker for now.  2. History of chest pain with supraventricular tachycardia.  Her      previous catheterization in 1997 was  normal and her most recent      Myoview showed no ischemia.  3. Tobacco abuse.  We discussed the importance of discontinuing this.  4. History of mild cerebrovascular disease.  We will repeat her      carotid Dopplers.  5. Chronic obstructive pulmonary disease.  6. Hypertension.  Her blood pressure is adequately controlled at      present.  7. Hyperlipidemia.  She will continue on her statin.   FOLLOW UP:  I will see her back in 4 months.   <     [name] [name]. [name] Som, MD, [facility]  Electronically Signed    BSC/MedQ  DD: 03/20/2007  DT: 03/21/2007  Job #: [id number]

## 2010-07-10 NOTE — Assessment & Plan Note (Signed)
Continue beta blocker. 

## 2010-07-10 NOTE — Progress Notes (Signed)
HPI: Pleasant female with a history of hypertension, hyperlipidemia, SVT, COPD, bladder cancer and cerebrovascular disease.  She had been evaluated recently for upcoming carotid endarterectomy.  Myoview study in December 2011 was negative for ischemia and her EF was 68%.  She presented to [facility] in early February for a COPD exacerbation and possible diastolic heart failure.  She was noted to have wide complex tachycardia that was felt to be ventricular tachycardia and she was transferred to [facility].  Cardiac catheterization on 04/06/10 demonstrated a totally occluded LAD with right to left collaterals, a nodular density in the prox CFX that occupied about 70% of the prox vessel, and preserved LVF.  It was felt that her CAD should be treated medically.  She was evaluated by Dr. Ladona Ridgel.  It is felt that her ventricular tachycardia would also be treated medically with adjustments in her beta blocker therapy. Since she was last seen, she does have dyspnea but no orthopnea, PND or pedal edema. She has not had chest pain. She has occasional palpitations that are nonsustained. She has not had syncope.  Current Outpatient Prescriptions  Medication Sig Dispense Refill  . acetaminophen-codeine (TYLENOL #3) 300-30 MG per tablet Take 1 tablet by mouth every 6 (six) hours as needed.        Marland Kitchen aspirin 81 MG EC tablet Take 81 mg by mouth daily.        . Aspirin-Acetaminophen 500-325 MG PACK Take by mouth. (Goody Body Pain) as needed       . atorvastatin (LIPITOR) 80 MG tablet Take 40 mg by mouth daily.       . budesonide-formoterol (SYMBICORT) 80-4.5 MCG/ACT inhaler Inhale 2 puffs into the lungs 2 (two) times daily.        . hydrochlorothiazide 25 MG tablet Take 25 mg by mouth daily.        Marland Kitchen ipratropium-albuterol (DUONEB) 0.5-2.5 (3) MG/3ML SOLN Take 3 mLs by nebulization daily.        . metoprolol (TOPROL-XL) 50 MG 24 hr tablet Take 37.5 mg by mouth daily.       . nitroGLYCERIN (NITROSTAT) 0.4  MG SL tablet Place 1 tablet (0.4 mg total) under the tongue every 5 (five) minutes as needed.  25 tablet  12  . Oxygen-Helium 20-80 % KIT Inhale into the lungs. 2.5 Liters, use as needed with exertion       . potassium chloride SA (K-DUR,KLOR-CON) 20 MEQ tablet Take 20 mEq by mouth 2 (two) times daily.        . travoprost, benzalkonium, (TRAVATAN) 0.004 % ophthalmic solution Place 1 drop into both eyes at bedtime.        Marland Kitchen DISCONTD: nitroGLYCERIN (NITROSTAT) 0.4 MG SL tablet Place 0.4 mg under the tongue every 5 (five) minutes as needed.           Past Medical History  Diagnosis Date  . Hypertension   . Hyperlipidemia   . Cerebrovascular disease, unspecified   . Unspecified glaucoma   . Obesity, unspecified   . COPD (chronic obstructive pulmonary disease)   . Coronary artery disease     cath 04/06/10: LAD occluded with R-L collats; med Rx WTC....probable V. Tach...med Rx  . Ventricular tachycardia   . SVT (supraventricular tachycardia)   . AAA (abdominal aortic aneurysm)     Past Surgical History  Procedure Date  . Cystoscopy   . Bladder tumor excision     resection of 5 bladder, and fulguration of 1 small bladder tumor  .  Cystoscopy     cold cup bladder biopsy of five tumors, and fulguration of one tumor  . Lung surgery     Remote right lung  . Tubal ligation     Bilateral    History   Social History  . Marital Status: Married    Spouse Name: N/A    Number of Children: N/A  . Years of Education: N/A   Occupational History  . Not on file.   Social History Main Topics  . Smoking status: Current Everyday Smoker -- 0.3 packs/day for 58 years    Types: Cigarettes  . Smokeless tobacco: Never Used  . Alcohol Use: No  . Drug Use: No  . Sexually Active: Not on file   Other Topics Concern  . Not on file   Social History Narrative  . No narrative on file    ROS: no fevers or chills, productive cough, hemoptysis, dysphasia, odynophagia, melena, hematochezia, dysuria,  hematuria, rash, seizure activity, orthopnea, PND, pedal edema, claudication. Remaining systems are negative.  Physical Exam: Well-developed well-nourished in no acute distress.  Skin is warm and dry.  HEENT is normal.  Neck is supple. No thyromegaly.  Chest is diminished breath sounds throughout Cardiovascular exam is regular rate and rhythm.  Abdominal exam nontender or distended. No masses palpated. Extremities show no edema. neuro grossly intact

## 2010-07-10 NOTE — Assessment & Plan Note (Signed)
Continue aspirin and statin. Followup carotid Dopplers. 

## 2010-07-10 NOTE — Op Note (Signed)
[name] [name], [name] [name]                   ACCOUNT NO.:  [id number]   MEDICAL RECORD NO.:  [id number]          PATIENT TYPE:  AMB   LOCATION:  [facility]                         FACILITY:  [facility]   PHYSICIAN:  [name], M.D.DATE OF BIRTH:  [birth year]   DATE OF PROCEDURE:  06/10/2008  DATE OF DISCHARGE:                               OPERATIVE REPORT   [name]    PREOPERATIVE DIAGNOSIS:  Recurrent bladder carcinoma.   POSTOPERATIVE DIAGNOSIS:  Recurrent bladder carcinoma.   PROCEDURE:  Cystoscopy, resection of 5 bladder tumors, and fulguration  of 1 small bladder tumor.   SURGEON:  [name], M.D.   ANESTHESIA:  MAC and local.   INDICATIONS:  This 70 year old lady had bladder carcinoma first  discovered in September of 2008 that was superficial and low grade and  treated by Dr. Wanda Plump.  I then took over her care and in August of  2009 was noted to have recurrent bladder tumors but because of medical  problems, therapy was delayed until November, at a which time recurrent  bladder tumors were resected and this was followed by a course of  therapy with BCG earlier this year.  She was recently cystoscoped and  unfortunately had further recurrent bladder tumors.   DESCRIPTION OF PROCEDURE:  The patient was brought to the operating room  and placed in the lithotomy position and she was given conscious  sedation and Xylocaine was injected per urethra.  She was cystoscoped  and had 6 small bladder tumors on the left wall.  Five of these were  resected with the cold cup bladder biopsy forceps and the sixth wound  was fulgurated.  In addition, the 5 resected lesions were fulgurated at  the biopsy sites.  At this point there was good hemostasis, essentially  no blood loss and no evidence of recurrent or residual bladder cancers.  The bladder was emptied, the scope removed, and the patient sent to the  recovery room in good condition, having tolerated the  procedure well.      [name], M.D.  Electronically Signed     LJP/MEDQ  D:  06/10/2008  T:  06/10/2008  Job:  [id number]

## 2010-07-10 NOTE — Assessment & Plan Note (Signed)
OFFICE VISIT   CASSIDEE, DEATS  DOB:  [birth year]                                       01/08/2010  DGUYQ#:[id number]   The patient returns today for followup of her left carotid stenosis.  She comes back today with an ultrasound which again shows 80%-99%  stenosis on the left and 0%-39% stenosis on the right.  Velocity profile  is increased.  Systolic is 448, diastolic is 563.  She remains  asymptomatic.  She denies numbness or weakness in either extremities.  She denies slurred speech.  She denies amaurosis fugax.  Dr. Ludwig Clarks  last note states that if she needs intervention she will need a Myoview  prior to her operation.  She has been told in the past that she is not a  candidate to be put to sleep again based on her most recent experience  with anesthesia at [facility].  She has been seen by Dr. Sherene Sires in the  past for pulmonary disease.   PHYSICAL EXAMINATION:  Vital signs:  Heart rate is 84, blood pressure is  127/69, O2 sats are 98%.  General:  She is well-appearing, in no  distress.  Lungs:  Clear bilaterally.  Cardiovascular:  Regular rate and  rhythm.  No carotid bruits.  Neurological:  She is intact.   DIAGNOSTIC STUDIES:  I have reviewed her carotid ultrasound and this  shows greater than 80% stenosis on the left, minimal stenosis on the  right.   ASSESSMENT AND PLAN:  The patient has asymptomatic high-grade carotid  stenosis which I think needs to be repaired.  She is at risk from a  heart lung perspective therefore we will need preoperative clearance  before we proceed.  Dr. Jens Som has recommended a Myoview prior to  operation.  I am asking him to perform this in the immediate future.  In  addition, since she has been told by anesthesia at [facility] that she  is not a candidate for further general anesthetic procedures I am going  to send her back to Dr. Sherene Sires who has seen her in the past for pulmonary  clearance.  Once these tests have  been performed we will make the  decision as far as how to proceed.  If she is not a candidate for  general anesthesia we would consider carotid stenting; if she is a  candidate for general anesthesia I would proceed with left carotid  endarterectomy.     Jorge Ny, MD  Electronically Signed   VWB/MEDQ  D:  01/08/2010  T:  01/09/2010  Job:  [id number]   cc:   Madolyn Frieze. Jens Som, MD, [facility]  Charlaine Dalton. Sherene Sires, MD, FCCP

## 2010-07-10 NOTE — Assessment & Plan Note (Signed)
OFFICE VISIT   [name] [name], [name] [name]  DOB:  March 02, 1940                                       06/19/2009  ZOXWR#:[id number]   PRIMARY CARE PHYSICIAN:  Joette Catching.   REASON FOR VISIT:  Carotid stenosis.   HISTORY:  This is a 70 year old female seen at request of Dr. Jens Som  for evaluation of carotid stenosis.  Patient has been followed with  progressive carotid stenosis.  She has remained asymptomatic.  She  recently had progression of her ultrasound by [facility] and she  comes in today for further discussion.  She denies having any symptoms.  Specifically she denies numbness or weakness in either extremity, no  slurring of speech, no amaurosis fugax.  Patient has a history of  cardiac disease, including supraventricular tachycardia.  She had a  heart cath in 1997 that showed obstructive disease.  Last Myoview was in  2003 with an injection fraction of 71%. Echo in 2009 showed normal LV  function and increase in increase in beta blocker has helped with her  SVT.  She suffers from hypercholesterolemia which is medically managed.  She continues to be a one and a half to two pack a day smoker.   REVIEW OF SYSTEMS:  GENERAL:  Negative for fevers, chills, weight gain,  weight loss.  CARDIAC:  Positive for palpitations, shortness of breath with exertion.  PULMONARY:  Positive for the cough, wheezing.  GI:  Positive for trouble swallowing, diarrhea.  GU:  Negative.  VASCULAR:  Positive pain in her feet when lying flat.  NEURO:  Negative.  MUSCULOSKELETAL:  Positive arthritis, joint pain.  PSYCH:  Negative.  EENT:  Negative.  HEME:  Negative.  SKIN:  Negative.   PAST MEDICAL HISTORY:  Hypertension, hyperlipidemia, COPD,  supraventricular tachycardia, and glaucoma.   PAST SURGICAL HISTORY:  Remote right lung surgery, tubal ligation,  cystoscopy with bladder tumor removal and fulguration.   FAMILY HISTORY:  Negative for cardiovascular disease at an early  age.   SOCIAL HISTORY:  She is married with 2 children.  She worked as a Psychologist, clinical, she is retired.  Smokes a pack and a half to two packs a day.  Does not drink.   PHYSICAL EXAMINATION:  Heart rate 82, blood pressure 108/72, temperature  97.7.  GENERAL:  Well-appearing, in no distress.  HEENT:  Within normal limits.  LUNGS:  Clear bilaterally.  CARDIOVASCULAR:  Regular rate and rhythm.  ABDOMEN:  Soft, nontender.  No carotid bruits.  Palpable pedal pulses.  MUSCULOSKELETAL:  Without major deformities.  NEURO:  She has no focal deficits or weaknesses.  SKIN:  Without rash.   DIAGNOSTIC STUDIES:  Ultrasound was repeated today.  This shows 60-79%  stenosis with an end diastolic velocity on the left at 99.   ASSESSMENT/PLAN:  Asymptomatic extracranial carotid disease.   PLAN:  Ultrasound in the office today shows is 60-79% stenosis.  Albeit  at the higher in the range, this is in contradiction to [facility]  ultrasound which showed greater than 80%.  The patient remains  asymptomatic and at this point in time does not wish to proceed with  operation.  I told her that we can repeat ultrasound in 6 months to see  if it has progressed on our scale.  The patient continues to smoke.  She  will need to  stop or to cut back on her cigarettes.  She then told past  that she is not a candidate for an operation because she could not be  put to sleep.  We will have to investigate this further if we need to  proceed with operation.  If she is not an operative candidate because of  her pulmonary status, we would consider carotid stenting.     Jorge Ny, MD  Electronically Signed   VWB/MEDQ  D:  06/19/2009  T:  06/20/2009  Job:  [id number]   cc:   Delaney Meigs, M.D.

## 2010-07-10 NOTE — Assessment & Plan Note (Signed)
Patient counseled on discontinuing. 

## 2010-07-10 NOTE — Assessment & Plan Note (Signed)
[facility]                            CARDIOLOGY OFFICE NOTE   NAME:[name] [name]                          MRN:          [id number]  DATE:11/04/2007                            DOB:          [birth year]    [name] [name] is a pleasant female who has a history of supraventricular  tachycardia, hypertension, hyperlipidemia, and cerebrovascular disease.  She had a catheterization in 1997 that showed no obstructive disease.  Her last Myoview was performed on October 08, 2001.  At that time, she  had an ejection fraction of 71%.  There was no evidence of scar  ischemia.  She also has a history of SVT.  She was seen by her report by  Dr. Ladona Ridgel.  I do not have those records available.  He apparently  increased her beta-blocker and she has had no symptoms since then.  She  does have some dyspnea on exertion which has been a longstanding issue.  However, there is no orthopnea, PND, pedal edema, palpitations,  presyncope, syncope, or exertional chest pain.  She is to have surgery  for bladder tumors in the near future and also needs clearance for this.   MEDICATIONS:  1. Benicar 20/12.5 mg p.o. daily.  2. Lipitor 40 mg p.o. daily.  3. Symbicort.  4. Aspirin 81 mg p.o. daily.  5. Toprol 50 mg p.o. daily.  6. Eye drops.   PHYSICAL EXAMINATION:  VITAL SIGNS:  Blood pressure of 123/77 and her  pulse is 89.  HEENT:  Normal.  NECK:  Supple with no bruits.  CHEST:  Shows diminished breath sounds throughout.  CARDIOVASCULAR:  Regular rate and rhythm.  ABDOMEN:  No tenderness.  EXTREMITIES:  No edema.   Her electrocardiogram shows a sinus rhythm at a rate of 89.  The axis is  normal.  There are no significant ST changes noted.  No prior septal  infarct cannot be excluded.   DIAGNOSES:  1. History of supraventricular tachycardia - [name] has had no      further palpitations on higher dose of beta-blocker.  She will      continue this.  Note, we did perform an  echocardiogram on April 14, 2007, that showed normal LV function, trivial mitral      regurgitation, and mild left atrial enlargement.  She also had a      TSH that was normal.  If she has worsening symptoms in the future,      then we could consider referral to the EP for ablation.  2. Preoperative evaluation prior to bladder surgery - This appears to      be a relatively low-risk surgery, and she had a Myoview performed      in 2003 that showed normal perfusion.  She has not had exertional      chest pain.  I think she can proceed safely without further cardiac      workup.  3. Tobacco abuse - We discussed importance of discontinuing this      between 3-10 minutes.  4. History  of cerebrovascular disease - She will continue on her      aspirin and her statin and will need follow up carotid Dopplers in      1 year.  5. Hypertension - Her blood pressure appears to be adequately      controlled on her present medications.  Dr. Lysbeth Galas is following her      renal function.  6. Chronic obstructive pulmonary disease.  7. Hyperlipidemia - She should continue on her statin and Dr. Lysbeth Galas      is following her lipids and liver.   We will see her back in 9 months.     Madolyn Frieze Jens Som, MD, [facility]  Electronically Signed    BSC/MedQ  DD: 11/04/2007  DT: 11/05/2007  Job #: [id number]   cc:   Delaney Meigs, M.D.

## 2010-07-10 NOTE — Patient Instructions (Signed)
Your physician wants you to follow-up in: 6 MONTHS You will receive a reminder letter in the mail two months in advance. If you don't receive a letter, please call our office to schedule the follow-up appointment.  Your physician has requested that you have a carotid duplex. This test is an ultrasound of the carotid arteries in your neck. It looks at blood flow through these arteries that supply the brain with blood. Allow one hour for this exam. There are no restrictions or special instructions.   Your physician has requested that you have an abdominal aorta duplex. During this test, an ultrasound is used to evaluate the aorta. Allow 30 minutes for this exam. Do not eat after midnight the day before and avoid carbonated beverages

## 2010-07-10 NOTE — Assessment & Plan Note (Signed)
[name] [name]                         ELECTROPHYSIOLOGY OFFICE NOTE   NAME:[name] [name], [name] [name]                          MRN:          [id number]  DATE:04/16/2007                            DOB:          21-Sep-1940    HISTORY:  [name] [name] returns today for followup.  She is a very pleasant  patient of Dr. Ludwig Clarks with a history of tachy palpitations,  documented SVT, hypertension and COPD, who is referred today for  consideration for catheter ablation.  The patient has a history of  documented SVT dating back to 2004.  Since then, she has had episodes  which come and go.  Typically, she will have these in a very  intermittent way.  Sometimes, she will go several months without an  episode and then at other times she will have 2 in a day.  Most  recently, she had one lasting 3-4 hours.  These are associated with very  mild chest pain.  There is minimal worsening shortness of breath and  never has she had syncope.  She states that sometimes they will stop on  their own.  Sometimes, she will take Rolaids and thinks that her spells  stop more quickly with this.  She has never had to be hospitalized for  her episodes of SVT.   PAST MEDICAL HISTORY:  1. Notable for COPD.  2. She has history of bladder cancer status post surgery.  3. She has a history of hypertension.  4. She has a history of dyslipidemia.  5. She has a history of obesity.   SOCIAL HISTORY:  The patient continues to smoke cigarettes smoking one-  half to one pack per day and has done so all of her adult life.  She  denies alcohol abuse.  She states that she likes to gamble in [place],  [place].   REVIEW OF SYSTEMS:  Positive for arthritis, particularly in her knees.  She also has chronic dyspnea and a chronic cough secondary to her COPD.  Otherwise, all systems were reviewed and negative except as noted in the  HPI.   PHYSICAL EXAMINATION:  GENERAL:  She is a pleasant chronically  ill-  appearing, middle-aged woman in no acute distress.  VITAL SIGNS:  Blood pressure today was 128/92, pulse was 110 and  regular, respirations were 18, weight 197 pounds.  HEENT:  Normocephalic and atraumatic.  Pupils are equal and round.  Oropharynx was moist.  Sclerae anicteric.  NECK:  Revealed no obvious jugular venous distention.  There is no  thyromegaly.  There is a soft left carotid bruit present.  Trachea was  midline.  LUNGS:  Clear to auscultation except for rales in the bases bilaterally.  They are very scant wheezes bilaterally as well.  There is no increased  work of breathing.  There are no rhonchi present.  CARDIOVASCULAR:  Distant with a regular rate and rhythm, interval S1-S2.  PMI was not enlarged or laterally displaced.  ABDOMINAL:  Obese, nontender, nondistended.  There is no organomegaly  present. Bowel sounds are present.  There is no rebound or guarding.  EXTREMITIES:  Demonstrate no cyanosis, clubbing or edema.  NEUROLOGIC:  She is alert and oriented x 3.  Cranial nerves are intact.  Strength was 5/5 and symmetric.   MEDICATIONS:  1. Benicar 20/12.5 daily.  2. Lipitor 40 a day.  3. Symbicort 160/4.5 two twice daily.  4. Aspirin 81 a day.  5. Toprol XL 50 a day.  6. Ciprofloxacin 500 daily.   IMPRESSION:  1. Recurrent supraventricular tachycardia despite medical therapy.  2. Chronic obstructive pulmonary disease.  3. Hypertension.  4. Dyslipidemia.   DISCUSSION:  I have discussed treatment options with the patient.  The  patient is not particularly inclined to proceed with catheter ablation.  As such, I have asked that she increase metoprolol from 50 mg a day to  75 mg a day in divided doses.  I will plan to see her back in the office  in several months.  If her symptoms of SVT worsen. then I would  recommend proceeding with the catheter ablation.     Doylene Canning. Ladona Ridgel, MD  Electronically Signed    GWT/MedQ  DD: 04/16/2007  DT: 04/17/2007  Job  #: [id number]

## 2010-07-10 NOTE — Assessment & Plan Note (Signed)
Plan medical therapy.continue aspirin, statin and beta blocker.

## 2010-07-10 NOTE — Assessment & Plan Note (Signed)
Repeat abdominal ultrasound. 

## 2010-07-10 NOTE — Op Note (Signed)
NAMESHAKEIA, [name] [name]                   ACCOUNT NO.:  [id number]   MEDICAL RECORD NO.:  [id number]          PATIENT TYPE:  AMB   LOCATION:  [facility]                         FACILITY:  [facility]   PHYSICIAN:  [name], M.D.DATE OF BIRTH:  1941-01-13   DATE OF PROCEDURE:  01/08/2008  DATE OF DISCHARGE:                               OPERATIVE REPORT   PREOPERATIVE DIAGNOSIS:  Recurrent bladder carcinoma.   POSTOPERATIVE DIAGNOSIS:  Recurrent bladder carcinoma.   PROCEDURE:  Cystoscopy, cold cup bladder biopsy of five tumors and  fulguration of one tumor.   SURGEON:  Dr. Larey Dresser.   ANESTHESIA:  MAC and local.   INDICATIONS:  This is a 71 year old lady with some significant cardiac  disease, who was initially seen by Dr. Wanda Plump who resected a bladder  tumor in September 2008 that was superficial and low grade with no  invasion.  Biopsy early in 2009 was negative.  She had a cystoscopy at  the end of August and was noted to have recurrent bladder tumors on the  left wall and she had delay coming in for resection because  of some  problems with congestive heart failure.  She was somewhat short of  breath today but chest x-ray was negative and we opted for going ahead  with just some IV sedation and local anesthesia.   PROCEDURE:  The patient was brought to the operating room, placed in  lithotomy position.  External genitalia prepped and draped in usual  fashion.  Xylocaine jelly was injected.  I then cystoscoped her and saw  several small recurrent papillary tumors on the left wall and anterior  wall.  I used a cold cup biopsy forceps to remove and send five of these  tumors to pathology.  When I was fulgurating the five biopsy sites, I  found one more small tumor that I just fulgurated, so she had a total of  six small papillary tumors.  At this point there was no evidence of any  other residual tumors and there was good hemostasis and no blood loss  and the bladder was  emptied, scope removed, the patient sent to recovery  room in good condition, having tolerated the procedure well.      [name], M.D.  Electronically Signed     LJP/MEDQ  D:  01/08/2008  T:  01/08/2008  Job:  [id number]   cc:   Madolyn Frieze. Jens Som, MD, [facility]  [address]  [address]  [place]  [place] [address]

## 2010-08-15 ENCOUNTER — Encounter (INDEPENDENT_AMBULATORY_CARE_PROVIDER_SITE_OTHER): Payer: Medicare Other | Admitting: Cardiology

## 2010-08-15 ENCOUNTER — Encounter: Payer: Self-pay | Admitting: Cardiology

## 2010-08-15 DIAGNOSIS — I6529 Occlusion and stenosis of unspecified carotid artery: Secondary | ICD-10-CM

## 2010-08-15 DIAGNOSIS — I714 Abdominal aortic aneurysm, without rupture: Secondary | ICD-10-CM

## 2010-09-10 ENCOUNTER — Ambulatory Visit (INDEPENDENT_AMBULATORY_CARE_PROVIDER_SITE_OTHER): Payer: Medicare Other | Admitting: Surgery

## 2010-09-10 DIAGNOSIS — I6529 Occlusion and stenosis of unspecified carotid artery: Secondary | ICD-10-CM

## 2010-09-11 NOTE — Assessment & Plan Note (Signed)
OFFICE VISIT  [name] [name], [name] [name] DOB:  08-20-1940                                       09/10/2010 ZOXWR#:[id number]  REASON FOR VISIT:  Follow up carotid stenosis.  HISTORY:  This is a 70 year old female that I saw back in November of 2011 for high-grade left carotid stenosis.  She was asymptomatic at that time.  I tried to get her scheduled for a left carotid intervention. She has been told in the past that she is not a candidate for general anesthesia secondary to her pulmonary disease.  I sent her to Dr. Sherene Sires, her pulmonologist, who reaffirmed that saying that she would be very high risk.  She also had a Myoview performed by Dr. Ludwig Clarks office which showed a small fixed apical defect.  She ended up getting hospitalized for dehydration and other issues which prevented her from having her procedure done and she has recently had a repeat ultrasound which shows progression of her stenosis on the left and the right.  She remains asymptomatic.  No numbness or weakness in either extremity.  No amaurosis fugax.  No slurred speech.  The patient continues to be medically managed for her hypertension and hypercholesterolemia as well as her COPD.  REVIEW OF SYSTEMS:  NEURO:  Positive for headaches. PULMONARY:  Positive for 2 liters home oxygen at night. GU:  Positive for history of bladder cancer. ENT:  Positive for change in eyesight. MUSCULOSKELETAL:  Positive for arthritis. All other review of systems are negative.  PAST MEDICAL HISTORY:  Hypertension, hyperlipidemia, COPD, supraventricular tachycardia, glaucoma.  SOCIAL HISTORY:  She is married with 2 children.  She continues to smoke half a pack a day.  No alcohol.  FAMILY HISTORY:  Positive for heart failure in her mother at age 69 and coronary artery disease and cancer in her father in his 36s.  MEDICATIONS:  Include Lipitor, aspirin, Benicar.  ALLERGIES:  None.  PHYSICAL EXAMINATION:  Vital signs:   Heart rate 79, blood pressure 150/78 on the right, 146/85 on the left, O2 sats 94%.  General:  She is well-appearing, in no distress.  HEENT:  Within normal limits.  Lungs: Clear bilaterally.  Cardiovascular:  Regular rate and rhythm. Neurological:  She is intact.  Skin:  Without rash.  DIAGNOSTIC STUDIES:  Duplex ultrasound today shows velocities of 583/196 on the left, 40%-59% stenosis on the right.  ASSESSMENT:  Asymptomatic high-grade left carotid stenosis.  PLAN:  I originally tried to get her revascularized 6 months ago. However, this did not happen.  Dr. Sherene Sires and Dr. Jens Som have reiterated that she is a very poor operative candidate and for that reason I think proceeding with carotid stenting would be next option.  We discussed the details of this procedure as well as the risk of stroke and bleeding. She is still somewhat apprehensive and does not want to get this done but is amenable to doing this.  I have scheduled her for August 1.  I have given her a prescription for Plavix and reiterated the importance of taking both aspirin and Plavix prior to and after the procedure.  All of her questions were answered today.    Jorge Ny, MD Electronically Signed  VWB/MEDQ  D:  09/10/2010  T:  09/11/2010  Job:  [id number]  cc:   Charlaine Dalton. Sherene Sires, MD, FCCP Madolyn Frieze. Crenshaw,  MD, [facility]

## 2010-09-26 ENCOUNTER — Ambulatory Visit (HOSPITAL_COMMUNITY)
Admission: RE | Admit: 2010-09-26 | Discharge: 2010-09-26 | Disposition: A | Discharge status: Home / Self Care | Payer: Medicare Other | Source: Ambulatory Visit | Attending: Surgery | Admitting: Surgery

## 2010-09-26 ENCOUNTER — Inpatient Hospital Stay (HOSPITAL_COMMUNITY): Payer: Medicare Other

## 2010-09-26 DIAGNOSIS — J449 Chronic obstructive pulmonary disease, unspecified: Secondary | ICD-10-CM | POA: Insufficient documentation

## 2010-09-26 DIAGNOSIS — I6529 Occlusion and stenosis of unspecified carotid artery: Secondary | ICD-10-CM | POA: Insufficient documentation

## 2010-09-26 DIAGNOSIS — I2510 Atherosclerotic heart disease of native coronary artery without angina pectoris: Secondary | ICD-10-CM | POA: Insufficient documentation

## 2010-09-26 DIAGNOSIS — J4489 Other specified chronic obstructive pulmonary disease: Secondary | ICD-10-CM | POA: Insufficient documentation

## 2010-09-26 DIAGNOSIS — R0602 Shortness of breath: Secondary | ICD-10-CM

## 2010-09-26 DIAGNOSIS — Z01818 Encounter for other preprocedural examination: Secondary | ICD-10-CM | POA: Insufficient documentation

## 2010-09-26 DIAGNOSIS — I714 Abdominal aortic aneurysm, without rupture, unspecified: Secondary | ICD-10-CM | POA: Insufficient documentation

## 2010-09-26 DIAGNOSIS — Z902 Acquired absence of lung [part of]: Secondary | ICD-10-CM | POA: Insufficient documentation

## 2010-09-26 DIAGNOSIS — I498 Other specified cardiac arrhythmias: Secondary | ICD-10-CM | POA: Insufficient documentation

## 2010-09-26 DIAGNOSIS — I10 Essential (primary) hypertension: Secondary | ICD-10-CM | POA: Insufficient documentation

## 2010-09-26 DIAGNOSIS — I679 Cerebrovascular disease, unspecified: Secondary | ICD-10-CM | POA: Insufficient documentation

## 2010-09-26 LAB — POCT I-STAT, CHEM 8
Calcium, Ion: 1.11 mmol/L — ABNORMAL LOW (ref 1.12–1.32)
Creatinine, Ser: 0.8 mg/dL (ref 0.50–1.10)
Glucose, Bld: 84 mg/dL (ref 70–99)
Hemoglobin: 18.7 g/dL — ABNORMAL HIGH (ref 12.0–15.0)
Potassium: 4.2 mEq/L (ref 3.5–5.1)
TCO2: 28 mmol/L (ref 0–100)

## 2010-10-05 NOTE — Consult Note (Signed)
[name] [name], BROTZMAN NO.:  [id number]  MEDICAL RECORD NO.:  [id number]  LOCATION:  2807                         FACILITY:  [facility]  PHYSICIAN:  Madolyn Frieze. Jens Som, MD, FACCDATE OF BIRTH:  [birth year]  DATE OF CONSULTATION:  09/26/2010 DATE OF DISCHARGE:  09/26/2010                                CONSULTATION   PRIMARY CARE PHYSICIAN:  Delaney Meigs, MD  PRIMARY CARDIOLOGIST:  Madolyn Frieze. Jens Som, MD, [facility]  PERIPHERAL VASCULAR PHYSICIAN: 1. Charlena Cross, MD  ELECTROPHYSIOLOGIST:  Doylene Canning. Ladona Ridgel, MD  CHIEF COMPLAINT:  Shortness of breath.  HISTORY OF PRESENT ILLNESS:  [name] [name] is a 70 year old female with a history of cardiovascular and peripheral vascular disease.  She was recently seen by Dr. Jens Som and has been followed by Dr. Myra Gianotti for left carotid stenosis.  She was evaluated by Dr. Sherene Sires and felt to be a high risk candidate for anesthesia because of her multiple medical problems.  A recent Myoview showed a small fixed apical defect.  Her left carotid stenosis had progressed and she was felt to be an appropriate candidate for carotid stenting.  She came to the hospital on September 26, 2010, for the procedure.  She had arterial access and cerebrovascular angiogram performed.  At the bifurcation of the left carotid, there was approximate 90% high-grade stenosis, a somewhat mobile calcific plaque was seen in the proximal internal carotid artery. Because of the anatomy, it was unable to place a stent and the procedure was aborted.  Postprocedure, [name] was sleeping and doing well.  However, when she woke up and was becoming more active, she developed more shortness of breath.  Her O2 sats dropped into the 80s on room air.  They improved on 2 liters of O2.  She had a chest x-ray which showed COPD with no acute disease.  She was given a nebulizer as well as IV Lasix.  She urinated greater than a liter after the IV Lasix.  Cardiology was asked  to evaluate her.  [name] has been off her hydrochlorothiazide for sometime.  She has not had any recent chest pain.  She has chronic dyspnea on exertion and normally gets short of breath walking room to room.  However, she feels that she has been slightly more short of breath than usual recently with exertion and does feel like she has put on some fluids.  Additionally, she normally takes nebulizers several times a day and has not had any today.  Since she received the Lasix and nebulizer, she feels her respiratory status is much improved.  She has not had any chest pain.  PAST MEDICAL HISTORY: 1. Status post cardiac catheterization in February 2012 showing LAD     subtotally occluded with collateral circulation, circumflex 70%,     and RCA without critical distal disease, medical therapy     recommended. 2. Preserved left ventricular function with mild anterolateral     hypokinesis and a PAS of 60 with a wedge of 12 and a mean of 40 at     cath in February 2012. 3. Ongoing tobacco use. 4. History of wide complex tachycardia, possibly VT  and medical     therapy recommended. 5. Severe COPD. 6. History of SVT. 7. Hypertension. 8. Hyperlipidemia. 9. History of CVA. 10.AAA 3.5 cm in September 2011. 11.Glaucoma. 12.History of hypokalemia. 13.Cerebrovascular disease with 40-59% right carotid stenosis and 80-     99% left carotid stenosis. 14.History of bladder carcinoma. 15.Obesity.  SURGICAL HISTORY:  She is status post cardiac catheterization as well as peripheral vascular catheterization, resection of bladder tumors with cystoscopy and transurethral resection of a bladder tumor.  ALLERGIES:  No known drug allergies.  CURRENT MEDICATIONS: 1. Hydrochlorothiazide 25 mg, 1/2 tablet q.4 h. p.r.n., not taken in     greater than 6 months. 2. Aspirin 81 mg a day. 3. Potassium 20 mEq b.i.d. 4. Symbicort b.i.d. 5. Goody Powders p.r.n. 6. Travatan 10 nightly. 7. Tylenol No. 3  p.r.n. 8. Fish oil daily. 9. Plavix 75 mg a day. 10.Lipitor 40 mg a day. 11.Toprol-XL 25 mg 1-1/2 tablets b.i.d.  SOCIAL HISTORY:  [place].  She used to work in the [employer].  She has approximately 50-pack-year history of ongoing tobacco use and smokes about 2-3 packs per week.  FAMILY HISTORY:  Noncontributory at this time.  REVIEW OF SYSTEMS:  Essentially negative except as stated in the HPI.  PHYSICAL EXAMINATION:  GENERAL:  She is a well-developed, obese white female, in no acute distress, at rest on O2. HEENT:  normal. NECK:  There is no lymphadenopathy, thyromegaly, bruit, or significant JVD noted. CARDIOVASCULAR:  Her heart is regular in rate and rhythm with an S1 and S2 and no significant murmur, rub, or gallop is noted.  Distal pulses are intact in all 4 extremities. LUNGS:  She has decreased breath sounds in both bases with a few crackles and rales, but no wheeze is noted. SKIN:  No rashes or lesions are noted, but her skin appears dry. ABDOMEN:  Obese and nontender with active bowel sounds. EXTREMITIES:  There is no cyanosis, clubbing, or edema noted. MUSCULOSKELETAL:  There is no joint deformity or effusions and no spine or CVA tenderness. NEURO:  She is alert and oriented with cranial nerves II-XII grossly intact.  Chest x-ray:  COPD with scarring, but no pleural fluid or acute disease.  LABORATORY VALUES:  Hemoglobin 18.7, hematocrit 55, sodium 140, potassium 4.2, chloride 105, BUN 22, creatinine 0.8, and glucose 84.  IMPRESSION:  [name] [name] was seen today by Dr. Jens Som, the patient evaluated and the data reviewed.  She has past medical history of severe chronic obstructive pulmonary disease, coronary artery disease, hypertension, hyperlipidemia, and cerebrovascular disease as well as supraventricular tachycardia and abdominal aortic aneurysm who complains of shortness of breath.  She had a carotid  arteriogram today with attempted left ICA stent.  After the procedure, she had increased shortness of breath.  Of note, the patient did not have her usual nebulized inhalers today.  She also notes a feeling of "extra fluid." She has chronic dyspnea on exertion and is on home O2 at times, but denies orthopnea, PND, pedal edema, chest pain, or productive cough.  A chest x-ray shows COPD.  We believe that her dyspnea is secondary to COPD with mild volume excess.  She was given Lasix 20 mg IV x1 with improvement.  She is stable for discharge home on previous bronchodilators.  We have added Lasix daily on a p.r.n. basis to her medication regimen and she should continue oxygen as needed.  A message has been left with the  office to arrange a followup appointment with Dr. Jens Som.     Theodore Demark, PA-C   ______________________________ Madolyn Frieze. Jens Som, MD, [facility]    RB/MEDQ  D:  09/26/2010  T:  09/27/2010  Job:  [id number]  Electronically Signed by Theodore Demark PA-C on 10/04/2010 02:07:13 PM Electronically Signed by Olga Millers MD [facility] on 10/05/2010 08:40:36 AM

## 2010-10-23 ENCOUNTER — Encounter: Payer: Self-pay | Admitting: Cardiology

## 2010-10-23 ENCOUNTER — Ambulatory Visit (INDEPENDENT_AMBULATORY_CARE_PROVIDER_SITE_OTHER): Payer: Medicare Other | Admitting: Cardiology

## 2010-10-23 DIAGNOSIS — I10 Essential (primary) hypertension: Secondary | ICD-10-CM

## 2010-10-23 DIAGNOSIS — I2510 Atherosclerotic heart disease of native coronary artery without angina pectoris: Secondary | ICD-10-CM

## 2010-10-23 DIAGNOSIS — I498 Other specified cardiac arrhythmias: Secondary | ICD-10-CM

## 2010-10-23 DIAGNOSIS — I679 Cerebrovascular disease, unspecified: Secondary | ICD-10-CM

## 2010-10-23 DIAGNOSIS — E785 Hyperlipidemia, unspecified: Secondary | ICD-10-CM

## 2010-10-23 DIAGNOSIS — I472 Ventricular tachycardia: Secondary | ICD-10-CM

## 2010-10-23 DIAGNOSIS — F17200 Nicotine dependence, unspecified, uncomplicated: Secondary | ICD-10-CM

## 2010-10-23 NOTE — Assessment & Plan Note (Signed)
Patient counseled on discontinuing. 

## 2010-10-23 NOTE — Assessment & Plan Note (Signed)
Continue statin. 

## 2010-10-23 NOTE — Progress Notes (Signed)
[id number] female with a history of hypertension, hyperlipidemia, SVT, AAA, COPD, bladder cancer and cerebrovascular disease. Myoview study in December 2011 was negative for ischemia and her EF was 68%. She presented to [facility] in early February for a COPD exacerbation and possible diastolic heart failure. She was noted to have wide complex tachycardia that was felt to be ventricular tachycardia and she was transferred to [facility]. Cardiac catheterization on 04/06/10 demonstrated a totally occluded LAD with right to left collaterals, a nodular density in the prox CFX that occupied about 70% of the prox vessel, and preserved LVF. It was felt that her CAD should be treated medically. She was evaluated by Dr. Ladona Ridgel. It is felt that her ventricular tachycardia would also be treated medically with adjustments in her beta blocker therapy. Ultrasound 6/12 revealed 3.6 by 3.6 cm AAA; fu one year. The patient recently had an attempt at carotid stent on the left. The procedure was unsuccessful due to anatomy. Since she was last seen, she hassignificant dyspnea on exertion which is a chronic issue. There is no orthopnea, PND, pedal edema, syncope, palpitations or chest pain.   Current Outpatient Prescriptions  Medication Sig Dispense Refill  . acetaminophen-codeine (TYLENOL #3) 300-30 MG per tablet Take 1 tablet by mouth every 6 (six) hours as needed.        Marland Kitchen aspirin 81 MG EC tablet Take 81 mg by mouth daily.        . Aspirin-Acetaminophen 500-325 MG PACK Take by mouth. (Goody Body Pain) as needed       . atorvastatin (LIPITOR) 80 MG tablet Take 40 mg by mouth 2 (two) times daily.       . budesonide-formoterol (SYMBICORT) 80-4.5 MCG/ACT inhaler Inhale 2 puffs into the lungs 2 (two) times daily.        . furosemide (LASIX) 20 MG tablet Take 1 tablet by mouth Daily.      . metoprolol (TOPROL-XL) 50 MG 24 hr tablet Take 37.5 mg by mouth daily.       . nitroGLYCERIN (NITROSTAT) 0.4 MG SL tablet  Place 1 tablet (0.4 mg total) under the tongue every 5 (five) minutes as needed.  25 tablet  12  . Oxygen-Helium 20-80 % KIT Inhale into the lungs. 2.5 Liters, use as needed with exertion       . PLAVIX 75 MG tablet Take 1 tablet by mouth Daily.      . potassium chloride SA (K-DUR,KLOR-CON) 20 MEQ tablet Take 20 mEq by mouth 2 (two) times daily.        . travoprost, benzalkonium, (TRAVATAN) 0.004 % ophthalmic solution Place 1 drop into both eyes at bedtime.        Marland Kitchen ipratropium-albuterol (DUONEB) 0.5-2.5 (3) MG/3ML SOLN Take 3 mLs by nebulization daily.           Past Medical History  Diagnosis Date  . Hypertension   . Hyperlipidemia   . Cerebrovascular disease, unspecified   . Unspecified glaucoma   . Obesity, unspecified   . COPD (chronic obstructive pulmonary disease)   . Coronary artery disease     cath 04/06/10: LAD occluded with R-L collats; med Rx WTC....probable V. Tach...med Rx  . Ventricular tachycardia   . SVT (supraventricular tachycardia)   . AAA (abdominal aortic aneurysm)     Past Surgical History  Procedure Date  . Cystoscopy   . Bladder tumor excision     resection of 5 bladder, and fulguration of 1 small bladder tumor  .  Cystoscopy     cold cup bladder biopsy of five tumors, and fulguration of one tumor  . Lung surgery     Remote right lung  . Tubal ligation     Bilateral    History   Social History  . Marital Status: Married    Spouse Name: N/A    Number of Children: N/A  . Years of Education: N/A   Occupational History  . Not on file.   Social History Main Topics  . Smoking status: Current Everyday Smoker -- 0.3 packs/day for 58 years    Types: Cigarettes  . Smokeless tobacco: Never Used  . Alcohol Use: No  . Drug Use: No  . Sexually Active: Not on file   Other Topics Concern  . Not on file   Social History Narrative  . No narrative on file    ROS: no fevers or chills, productive cough, hemoptysis, dysphasia, odynophagia, melena,  hematochezia, dysuria, hematuria, rash, seizure activity, orthopnea, PND, pedal edema, claudication. Remaining systems are negative.  Physical Exam: Well-developed well-nourished in no acute distress.  Skin is warm and dry.  HEENT is normal.  Neck is supple. No thyromegaly.  Chest is diminished breath sounds throughout. Cardiovascular exam is regular rate and rhythm.  Abdominal exam nontender or distended. No masses palpated. Extremities show no edema. neuro grossly intact  ECG Sinus rhythm with pacs; no ST changes.

## 2010-10-23 NOTE — Patient Instructions (Addendum)
Your physician wants you to follow-up in: 6 MONTHS You will receive a reminder letter in the mail two months in advance. If you don't receive a letter, please call our office to schedule the follow-up appointment.   DECREASE METOPROLOL TO 1/2 TABLET TWICE DAILY

## 2010-10-23 NOTE — Assessment & Plan Note (Signed)
Continue aspirin, Plavix and statin. Followup vascular surgery as scheduled. Carotid stent was unable to be placed. Plan medical therapy.

## 2010-10-23 NOTE — Assessment & Plan Note (Signed)
Continue beta blocker. Decrease to 25 mg b.i.d. With history of bradycardia.

## 2010-10-23 NOTE — Assessment & Plan Note (Signed)
Blood pressure controlled. Continue present medications. 

## 2010-10-23 NOTE — Assessment & Plan Note (Signed)
Continue aspirin and statin. 

## 2010-10-23 NOTE — Assessment & Plan Note (Signed)
followup abdominal ultrasound  June 2013.

## 2010-10-23 NOTE — Assessment & Plan Note (Signed)
Continue beta blocker. Patient is describing bradycardia at times. Change Toprol to 25 mg p.o. B.i.d.

## 2010-10-25 NOTE — Op Note (Signed)
[name] [name], [name] [name]                   ACCOUNT NO.:  [id number]  MEDICAL RECORD NO.:  [id number]  LOCATION:  2807                         FACILITY:  [facility]  PHYSICIAN:  Juleen China IV, MDDATE OF BIRTH:  06/04/1940  DATE OF PROCEDURE:  09/26/2010 DATE OF DISCHARGE:                              OPERATIVE REPORT   PREOPERATIVE DIAGNOSIS:  Asymptomatic left carotid stenosis.  POSTOPERATIVE DIAGNOSIS:  Asymptomatic left carotid stenosis.  PROCEDURES PERFORMED: 1. Ultrasound access, right femoral artery. 2. Aortic arch angiogram. 3. Second-order catheterization (left carotid artery). 4. Left carotid angiogram. 5. Third order catheterization (right carotid artery). 6. Right carotid angiogram.  INDICATIONS:  This is a 70 year old female that I have been following for progressive left internal carotid stenosis.  She is deemed to not be a candidate for open repair given her pulmonary and cardiac status.  Her only option for carotid revascularizations at this time is carotid stenting.  I have tried to arrange this in the past, however, the patient has been somewhat reluctant.  She remains asymptomatic, but she has had progression of the disease by ultrasound.  She therefore has consented to arteriogram and possible intervention.  OPERATING PHYSICIANS: 1. Juleen China IV, MD 2. Nanetta Batty, MD  PROCEDURE:  The patient was identified in the holding area and taken to room #8, placed supine on the table.  The right groin was prepped and draped in the usual fashion.  A time-out was called.  The right femoral artery was evaluated with ultrasound and found to be widely patent.  A digital ultrasound image was acquired.  The right femoral artery was accessed under ultrasound guidance with micropuncture needle and an 0.018 wire was advanced without resistance.  The micropuncture sheath was placed.  I then placed a Bentson wire followed by a 5-French sheath. I had little difficulty  navigating the Bentson wire through the abdominal aorta.  Ultimately, a KMP catheter was used.  Wire access was then advanced into the ascending aorta and a pigtail catheter was placed over the wire and aortic arch angiogram was performed.  Next, the right carotid artery was selected with a Simmons 1 catheter.  I was unable to get purchase into the right common carotid artery for prolonged images. We did have a single image of the right carotid artery with the catheter in the right common carotid artery, however, the right carotid and angiogram images were performed with the [facility] catheter in the innominate artery.  I then used a Simmons catheter to select the left carotid artery.  This was a bovine anatomy and then the left carotid angiogram was performed with intracranial images.  The neuroradiology will interpret the intracranial findings.  Aortogram:  A bovine aortic arch is visualized with the bovine anatomy coming off on the ascending portion of the arch consistent with a type 2 arch.  The proximal right subclavian was widely patent.  The vertebral artery arose from the right subclavian and was widely patent.  The right subclavian was widely patent.  There was a little filling defect a centimeter past the origin of the left and common carotid artery.  This was not hemodynamically significant.  The remaining portion of the intrathoracic carotid artery was widely patent.  The left subclavian artery was patent throughout its course.  The left vertebral artery arose from left subclavian and was somewhat diminutive.  Right carotid angiogram:  The right common carotid artery was patent throughout its course.  Less than 50% stenosis was identified within the right common carotid artery.  Left carotid angiogram:  The left common carotid artery was patent, however, at the bifurcation there was significant calcification with a high-grade stenosis approximately 90%, somewhat mobile  calcific plaque was seen in the proximal internal carotid artery.  Intervention:  At this point in time, the decision was made to proceed. I was little reluctant to proceed because of the nature of the plaque at the bifurcation.  However, the patient does not have any other options for carotid repair and she does have a high-grade stenosis.  I therefore switched out into a long 6-French sheath over a wire.  I tried multiple catheters to gain access into the left carotid artery including a V- Tech, a JB-1, and an H1 catheter.  Because of the arch anatomy, it was very difficult to get access into the left common carotid artery.  I did ultimately get an Amplatz wire into the left common carotid artery using the JB-1 catheter.  Because of the anatomy, however, I was unable to advance the catheter into the carotid artery.  After an exhaustive attempt at gaining access and good purchase into the common carotid artery at the end, this was not possible and therefore I elected to terminate the procedure.  Therefore, no stent was placed.  A long 6-French sheath was exchanged out for a short 6-French sheath and the patient was taken to the holding area.  IMPRESSION: 1. Bovine aortic arch anatomy. 2. High-grade left carotid stenosis, approximately 90% that was unable     to be stented due to the patient's anatomy.  We were unable to get     good purchase into the carotid artery because of the angulation.     Jorge Ny, MD     VWB/MEDQ  D:  09/26/2010  T:  09/26/2010  Job:  [id number]  Electronically Signed by Arelia Longest IV MD on 10/25/2010 12:03:04 AM

## 2010-11-08 ENCOUNTER — Encounter: Payer: Medicare Other | Admitting: Cardiology

## 2010-11-27 LAB — POCT I-STAT 4, (NA,K, GLUC, HGB,HCT)
Glucose, Bld: 94
HCT: 55 — ABNORMAL HIGH
Hemoglobin: 18.7 — ABNORMAL HIGH
Potassium: 4.8
Sodium: 138

## 2010-12-07 LAB — BASIC METABOLIC PANEL
BUN: 11
Chloride: 101
Creatinine, Ser: 1.05

## 2010-12-07 LAB — HEMOGLOBIN AND HEMATOCRIT, BLOOD: Hemoglobin: 15.7 — ABNORMAL HIGH

## 2011-02-26 DIAGNOSIS — J449 Chronic obstructive pulmonary disease, unspecified: Secondary | ICD-10-CM

## 2011-02-26 HISTORY — DX: Chronic obstructive pulmonary disease, unspecified: J44.9

## 2011-03-05 ENCOUNTER — Ambulatory Visit: Payer: Medicare Other | Admitting: Cardiology

## 2011-03-28 ENCOUNTER — Encounter: Payer: Self-pay | Admitting: Cardiology

## 2011-03-28 ENCOUNTER — Ambulatory Visit (INDEPENDENT_AMBULATORY_CARE_PROVIDER_SITE_OTHER): Payer: Medicare Other | Admitting: Cardiology

## 2011-03-28 VITALS — BP 146/84 | HR 77 | Ht 65.0 in | Wt 162.4 lb

## 2011-03-28 DIAGNOSIS — I679 Cerebrovascular disease, unspecified: Secondary | ICD-10-CM

## 2011-03-28 DIAGNOSIS — I714 Abdominal aortic aneurysm, without rupture, unspecified: Secondary | ICD-10-CM

## 2011-03-28 DIAGNOSIS — I498 Other specified cardiac arrhythmias: Secondary | ICD-10-CM

## 2011-03-28 DIAGNOSIS — I2510 Atherosclerotic heart disease of native coronary artery without angina pectoris: Secondary | ICD-10-CM

## 2011-03-28 DIAGNOSIS — E785 Hyperlipidemia, unspecified: Secondary | ICD-10-CM

## 2011-03-28 DIAGNOSIS — I10 Essential (primary) hypertension: Secondary | ICD-10-CM

## 2011-03-28 DIAGNOSIS — F17200 Nicotine dependence, unspecified, uncomplicated: Secondary | ICD-10-CM

## 2011-03-28 NOTE — Assessment & Plan Note (Signed)
Patient counseled on discontinuing. 

## 2011-03-28 NOTE — Assessment & Plan Note (Signed)
Followup abdominal ultrasound June 2013.

## 2011-03-28 NOTE — Assessment & Plan Note (Signed)
Continue aspirin and statin. Followed by vascular surgery. 

## 2011-03-28 NOTE — Assessment & Plan Note (Signed)
Continue beta blocker. 

## 2011-03-28 NOTE — Assessment & Plan Note (Signed)
Continue statin. 

## 2011-03-28 NOTE — Progress Notes (Signed)
HPI: Pleasant female with a history of CAD, hypertension, hyperlipidemia, SVT, AAA, COPD, bladder cancer and cerebrovascular disease. She presented to [facility] in early February 2012 for a COPD exacerbation and possible diastolic heart failure. She was noted to have wide complex tachycardia that was felt to be ventricular tachycardia and she was transferred to [facility]. Cardiac catheterization on 04/06/10 demonstrated a totally occluded LAD with right to left collaterals, a nodular density in the prox CFX that occupied about 70% of the prox vessel, and preserved LVF. It was felt that her CAD should be treated medically. She was evaluated by Dr. Ladona Ridgel. It was felt that her ventricular tachycardia would also be treated medically with adjustments in her beta blocker therapy. Ultrasound 6/12 revealed 3.6 by 3.6 cm AAA; fu one year. The patient has previous attempt at carotid stent on the left. The procedure was unsuccessful due to anatomy. Since she was last seen in August of 2012, she has significant dyspnea on exertion which is a chronic issue. There is no orthopnea, PND, pedal edema, syncope, palpitations or chest pain.   Current Outpatient Prescriptions  Medication Sig Dispense Refill  . acetaminophen-codeine (TYLENOL #3) 300-30 MG per tablet Take 1 tablet by mouth every 6 (six) hours as needed.        Marland Kitchen aspirin 81 MG EC tablet Take 81 mg by mouth daily.        . Aspirin-Acetaminophen 500-325 MG PACK Take by mouth. (Goody Body Pain) as needed       . atorvastatin (LIPITOR) 80 MG tablet Take 40 mg by mouth 2 (two) times daily.       . budesonide-formoterol (SYMBICORT) 80-4.5 MCG/ACT inhaler Inhale 2 puffs into the lungs 2 (two) times daily.        . furosemide (LASIX) 20 MG tablet Take 1 tablet by mouth Daily.      Marland Kitchen ipratropium-albuterol (DUONEB) 0.5-2.5 (3) MG/3ML SOLN Take 3 mLs by nebulization daily.        . metoprolol (TOPROL-XL) 50 MG 24 hr tablet TAKE 1/2 TABLET TWICE  DAILY  30 tablet  12  . nitroGLYCERIN (NITROSTAT) 0.4 MG SL tablet Place 1 tablet (0.4 mg total) under the tongue every 5 (five) minutes as needed.  25 tablet  12  . Oxygen-Helium 20-80 % KIT Inhale into the lungs. 2.5 Liters, use as needed with exertion       . PLAVIX 75 MG tablet Take 1 tablet by mouth Daily.      . potassium chloride SA (K-DUR,KLOR-CON) 20 MEQ tablet Take 20 mEq by mouth 2 (two) times daily.        . travoprost, benzalkonium, (TRAVATAN) 0.004 % ophthalmic solution Place 1 drop into both eyes at bedtime.           Past Medical History  Diagnosis Date  . Hypertension   . Hyperlipidemia   . Cerebrovascular disease, unspecified   . Unspecified glaucoma   . Obesity, unspecified   . COPD (chronic obstructive pulmonary disease)   . Coronary artery disease     cath 04/06/10: LAD occluded with R-L collats; med Rx WTC....probable V. Tach...med Rx  . Ventricular tachycardia   . SVT (supraventricular tachycardia)   . AAA (abdominal aortic aneurysm)     Past Surgical History  Procedure Date  . Cystoscopy   . Bladder tumor excision     resection of 5 bladder, and fulguration of 1 small bladder tumor  . Cystoscopy     cold cup  bladder biopsy of five tumors, and fulguration of one tumor  . Lung surgery     Remote right lung  . Tubal ligation     Bilateral    History   Social History  . Marital Status: Married    Spouse Name: N/A    Number of Children: N/A  . Years of Education: N/A   Occupational History  . Not on file.   Social History Main Topics  . Smoking status: Current Everyday Smoker -- 0.3 packs/day for 58 years    Types: Cigarettes  . Smokeless tobacco: Never Used  . Alcohol Use: No  . Drug Use: No  . Sexually Active: Not on file   Other Topics Concern  . Not on file   Social History Narrative  . No narrative on file    ROS: no fevers or chills, productive cough, hemoptysis, dysphasia, odynophagia, melena, hematochezia, dysuria, hematuria,  rash, seizure activity, orthopnea, PND, pedal edema, claudication. Remaining systems are negative.  Physical Exam: Well-developed well-nourished in no acute distress.  Skin is warm and dry.  HEENT is normal.  Neck is supple. No thyromegaly.  Chest is diminished breath sounds throughout Cardiovascular exam is regular rate and rhythm.  Abdominal exam nontender or distended. No masses palpated. Extremities show no edema. neuro grossly intact  ECG sinus rhythm at a rate of 77. Right axis deviation.

## 2011-03-28 NOTE — Assessment & Plan Note (Signed)
Blood pressure controlled. Continue present medications. 

## 2011-03-28 NOTE — Patient Instructions (Signed)
Your physician wants you to follow-up in: 6 MONTHS You will receive a reminder letter in the mail two months in advance. If you don't receive a letter, please call our office to schedule the follow-up appointment.   Your physician has requested that you have an abdominal aorta duplex. During this test, an ultrasound is used to evaluate the aorta. Allow 30 minutes for this exam. Do not eat after midnight the day before and avoid carbonated beverages DUE IN [place]

## 2011-03-28 NOTE — Assessment & Plan Note (Signed)
Continue aspirin and statin. 

## 2011-03-29 ENCOUNTER — Encounter: Payer: Self-pay | Admitting: Surgery

## 2011-04-01 ENCOUNTER — Ambulatory Visit (INDEPENDENT_AMBULATORY_CARE_PROVIDER_SITE_OTHER): Payer: Medicare Other | Admitting: Surgery

## 2011-04-01 ENCOUNTER — Other Ambulatory Visit (INDEPENDENT_AMBULATORY_CARE_PROVIDER_SITE_OTHER): Payer: Medicare Other | Admitting: *Deleted

## 2011-04-01 ENCOUNTER — Encounter: Payer: Self-pay | Admitting: Surgery

## 2011-04-01 VITALS — BP 149/79 | HR 82 | Resp 20 | Ht 65.0 in | Wt 160.0 lb

## 2011-04-01 DIAGNOSIS — I6529 Occlusion and stenosis of unspecified carotid artery: Secondary | ICD-10-CM

## 2011-04-01 DIAGNOSIS — Z48812 Encounter for surgical aftercare following surgery on the circulatory system: Secondary | ICD-10-CM

## 2011-04-01 NOTE — Progress Notes (Signed)
[facility]ist of [facility]   Patient name: [name] [name] MRN: [id number] DOB: Oct 06, 1940 Sex: female     Chief Complaint  Patient presents with  . Carotid    6 month follow up with labs    HISTORY OF PRESENT ILLNESS: The patient is back today for followup of her left carotid stenosis. The patient remained asymptomatic. She was originally scheduled to undergo left carotid endarterectomy however she was deemed to be extremely high risk from a pulmonary perspective and therefore we elected to attempt left carotid stenting. Due to the tortuosity in her vessels a stable platform could not be obtained and therefore carotid stenting was aborted. Because of her high medical risks with general anesthesia she is being managed medically. She continues to be without symptoms. She denies numbness or weakness in either extremity she denies slurred speech she denies amaurosis fugax.  Past Medical History  Diagnosis Date  . Hypertension   . Hyperlipidemia   . Cerebrovascular disease, unspecified   . Unspecified glaucoma   . Obesity, unspecified   . COPD (chronic obstructive pulmonary disease)   . Coronary artery disease     cath 04/06/10: LAD occluded with R-L collats; med Rx WTC....probable V. Tach...med Rx  . Ventricular tachycardia   . SVT (supraventricular tachycardia)   . AAA (abdominal aortic aneurysm)     Past Surgical History  Procedure Date  . Cystoscopy   . Bladder tumor excision     resection of 5 bladder, and fulguration of 1 small bladder tumor  . Cystoscopy     cold cup bladder biopsy of five tumors, and fulguration of one tumor  . Lung surgery     Remote right lung  . Tubal ligation     Bilateral    History   Social History  . Marital Status: Married    Spouse Name: N/A    Number of Children: N/A  . Years of Education: N/A   Occupational History  . Not on file.   Social History Main Topics  . Smoking status: Current Everyday Smoker -- 0.3 packs/day for  58 years    Types: Cigarettes  . Smokeless tobacco: Never Used  . Alcohol Use: No  . Drug Use: No  . Sexually Active: Not on file   Other Topics Concern  . Not on file   Social History Narrative  . No narrative on file    Family History  Problem Relation Age of Onset  . Heart disease      Allergies as of 04/01/2011  . (No Known Allergies)    Current Outpatient Prescriptions on File Prior to Visit  Medication Sig Dispense Refill  . acetaminophen-codeine (TYLENOL #3) 300-30 MG per tablet Take 1 tablet by mouth every 6 (six) hours as needed.        Marland Kitchen aspirin 81 MG EC tablet Take 81 mg by mouth daily.        . Aspirin-Acetaminophen 500-325 MG PACK Take by mouth. (Goody Body Pain) as needed       . atorvastatin (LIPITOR) 80 MG tablet Take 40 mg by mouth 2 (two) times daily.       . budesonide-formoterol (SYMBICORT) 80-4.5 MCG/ACT inhaler Inhale 2 puffs into the lungs 2 (two) times daily.        . furosemide (LASIX) 20 MG tablet Take 1 tablet by mouth Daily.      Marland Kitchen ipratropium-albuterol (DUONEB) 0.5-2.5 (3) MG/3ML SOLN Take 3 mLs by nebulization daily.        Marland Kitchen  metoprolol (TOPROL-XL) 50 MG 24 hr tablet TAKE 1/2 TABLET TWICE DAILY  30 tablet  12  . nitroGLYCERIN (NITROSTAT) 0.4 MG SL tablet Place 1 tablet (0.4 mg total) under the tongue every 5 (five) minutes as needed.  25 tablet  12  . Oxygen-Helium 20-80 % KIT Inhale into the lungs. 2.5 Liters, use as needed with exertion       . PLAVIX 75 MG tablet Take 1 tablet by mouth Daily.      . potassium chloride SA (K-DUR,KLOR-CON) 20 MEQ tablet Take 20 mEq by mouth 2 (two) times daily.        . travoprost, benzalkonium, (TRAVATAN) 0.004 % ophthalmic solution Place 1 drop into both eyes at bedtime.           REVIEW OF SYSTEMS: Discomfort in both legs no chest pain positive shortness of breath otherwise negative  PHYSICAL EXAMINATION:   Vital signs are BP 149/79  Pulse 82  Resp 20  Ht 5\' 5"  (1.651 m)  Wt 160 lb (72.576 kg)  BMI  26.63 kg/m2  SpO2 90% General: The patient appears their stated age. HEENT:  No gross abnormalities Pulmonary:  Non labored breathingtender Musculoskeletal: There are no major deformities. Neurologic: No focal weakness or paresthesias are detected, Skin: There are no ulcer or rashes noted. Psychiatric: The patient has normal affect. Cardiovascular: There is a regular rate and rhythm without significant murmur appreciated. No carotid bruits   Diagnostic Studies Carotid duplex was performed today this shows greater than 80% left carotid stenosis the velocities are 393/132  Assessment: Asymptomatic left carotid stenosis Plan: The patient remains at extremely high risk for general anesthesia. That reason, coupled with the fact that she is asymptomatic I am electing to continue to treat her medically. Should she develop symptoms of TIA we will reconsider general anesthesia for carotid endarterectomy. However at this time I recommend continued medical management. She will get another carotid ultrasound in 6 months  V. Charlena Cross, M.D. [facility] [place] Office: [phone] Pager:  [phone]

## 2011-04-22 NOTE — Procedures (Unsigned)
CAROTID DUPLEX EXAM  INDICATION:  Carotid disease  HISTORY: Diabetes:  No Cardiac:  No Hypertension:  Yes Smoking:  No Previous Surgery:  No CV History:  No Amaurosis Fugax No, Paresthesias No, Hemiparesis No                                      RIGHT             LEFT Brachial systolic pressure:         144               154 Brachial Doppler waveforms:         Normal            Normal Vertebral direction of flow:        Antegrade         Antegrade DUPLEX VELOCITIES (cm/sec) CCA peak systolic                   64                61 ECA peak systolic                   102               106 ICA peak systolic                   69                393 ICA end diastolic                   25                132 PLAQUE MORPHOLOGY:                  Mixed             Mixed PLAQUE AMOUNT:                      Mild              Severe PLAQUE LOCATION:                    ICA/ECA/CCA       ICA/ECA/CCA  IMPRESSION:  Doppler velocity suggests no hemodynamically significant stenosis of the right internal carotid artery and an 80% to 99% stenosis of the left proximal internal carotid artery.  Velocities of the left internal carotid artery appeared consistent with angiogram performed on 09/26/2010.  ___________________________________________ V. Charlena Cross, MD  CH/MEDQ  D:  04/03/2011  T:  04/03/2011  Job:  [id number]

## 2011-05-15 ENCOUNTER — Other Ambulatory Visit: Payer: Self-pay

## 2011-05-15 ENCOUNTER — Inpatient Hospital Stay (HOSPITAL_COMMUNITY)
Admission: EM | Admit: 2011-05-15 | Discharge: 2011-06-04 | DRG: 871 | Disposition: A | Discharge status: Rehabilitation Facility | Payer: Medicare Other | Attending: Pulmonary Disease | Admitting: Pulmonary Disease

## 2011-05-15 ENCOUNTER — Emergency Department (HOSPITAL_COMMUNITY): Payer: Medicare Other

## 2011-05-15 DIAGNOSIS — I679 Cerebrovascular disease, unspecified: Secondary | ICD-10-CM

## 2011-05-15 DIAGNOSIS — A419 Sepsis, unspecified organism: Principal | ICD-10-CM

## 2011-05-15 DIAGNOSIS — J441 Chronic obstructive pulmonary disease with (acute) exacerbation: Secondary | ICD-10-CM | POA: Diagnosis present

## 2011-05-15 DIAGNOSIS — Z6826 Body mass index (BMI) 26.0-26.9, adult: Secondary | ICD-10-CM

## 2011-05-15 DIAGNOSIS — J9819 Other pulmonary collapse: Secondary | ICD-10-CM | POA: Diagnosis present

## 2011-05-15 DIAGNOSIS — B370 Candidal stomatitis: Secondary | ICD-10-CM

## 2011-05-15 DIAGNOSIS — I471 Supraventricular tachycardia, unspecified: Secondary | ICD-10-CM | POA: Diagnosis not present

## 2011-05-15 DIAGNOSIS — G9340 Encephalopathy, unspecified: Secondary | ICD-10-CM | POA: Diagnosis not present

## 2011-05-15 DIAGNOSIS — I2489 Other forms of acute ischemic heart disease: Secondary | ICD-10-CM | POA: Diagnosis not present

## 2011-05-15 DIAGNOSIS — J189 Pneumonia, unspecified organism: Secondary | ICD-10-CM

## 2011-05-15 DIAGNOSIS — H409 Unspecified glaucoma: Secondary | ICD-10-CM

## 2011-05-15 DIAGNOSIS — I5033 Acute on chronic diastolic (congestive) heart failure: Secondary | ICD-10-CM | POA: Diagnosis not present

## 2011-05-15 DIAGNOSIS — I714 Abdominal aortic aneurysm, without rupture, unspecified: Secondary | ICD-10-CM | POA: Diagnosis present

## 2011-05-15 DIAGNOSIS — F17200 Nicotine dependence, unspecified, uncomplicated: Secondary | ICD-10-CM

## 2011-05-15 DIAGNOSIS — E669 Obesity, unspecified: Secondary | ICD-10-CM | POA: Diagnosis present

## 2011-05-15 DIAGNOSIS — E785 Hyperlipidemia, unspecified: Secondary | ICD-10-CM | POA: Diagnosis present

## 2011-05-15 DIAGNOSIS — R339 Retention of urine, unspecified: Secondary | ICD-10-CM | POA: Diagnosis not present

## 2011-05-15 DIAGNOSIS — Z23 Encounter for immunization: Secondary | ICD-10-CM

## 2011-05-15 DIAGNOSIS — I2510 Atherosclerotic heart disease of native coronary artery without angina pectoris: Secondary | ICD-10-CM | POA: Diagnosis present

## 2011-05-15 DIAGNOSIS — R6521 Severe sepsis with septic shock: Secondary | ICD-10-CM

## 2011-05-15 DIAGNOSIS — M1710 Unilateral primary osteoarthritis, unspecified knee: Secondary | ICD-10-CM | POA: Diagnosis present

## 2011-05-15 DIAGNOSIS — G9349 Other encephalopathy: Secondary | ICD-10-CM | POA: Diagnosis present

## 2011-05-15 DIAGNOSIS — J9692 Respiratory failure, unspecified with hypercapnia: Secondary | ICD-10-CM

## 2011-05-15 DIAGNOSIS — Z7982 Long term (current) use of aspirin: Secondary | ICD-10-CM

## 2011-05-15 DIAGNOSIS — J449 Chronic obstructive pulmonary disease, unspecified: Secondary | ICD-10-CM

## 2011-05-15 DIAGNOSIS — I10 Essential (primary) hypertension: Secondary | ICD-10-CM

## 2011-05-15 DIAGNOSIS — R739 Hyperglycemia, unspecified: Secondary | ICD-10-CM | POA: Diagnosis present

## 2011-05-15 DIAGNOSIS — Z9229 Personal history of other drug therapy: Secondary | ICD-10-CM

## 2011-05-15 DIAGNOSIS — R5381 Other malaise: Secondary | ICD-10-CM | POA: Diagnosis not present

## 2011-05-15 DIAGNOSIS — E46 Unspecified protein-calorie malnutrition: Secondary | ICD-10-CM | POA: Diagnosis present

## 2011-05-15 DIAGNOSIS — I472 Ventricular tachycardia: Secondary | ICD-10-CM

## 2011-05-15 DIAGNOSIS — Z79899 Other long term (current) drug therapy: Secondary | ICD-10-CM

## 2011-05-15 DIAGNOSIS — F411 Generalized anxiety disorder: Secondary | ICD-10-CM | POA: Diagnosis present

## 2011-05-15 DIAGNOSIS — E872 Acidosis: Secondary | ICD-10-CM

## 2011-05-15 DIAGNOSIS — Z7902 Long term (current) use of antithrombotics/antiplatelets: Secondary | ICD-10-CM

## 2011-05-15 DIAGNOSIS — I672 Cerebral atherosclerosis: Secondary | ICD-10-CM | POA: Diagnosis present

## 2011-05-15 DIAGNOSIS — R6510 Systemic inflammatory response syndrome (SIRS) of non-infectious origin without acute organ dysfunction: Secondary | ICD-10-CM

## 2011-05-15 DIAGNOSIS — T17308A Unspecified foreign body in larynx causing other injury, initial encounter: Secondary | ICD-10-CM | POA: Diagnosis not present

## 2011-05-15 DIAGNOSIS — IMO0002 Reserved for concepts with insufficient information to code with codable children: Secondary | ICD-10-CM | POA: Diagnosis not present

## 2011-05-15 DIAGNOSIS — I248 Other forms of acute ischemic heart disease: Secondary | ICD-10-CM | POA: Diagnosis not present

## 2011-05-15 DIAGNOSIS — R0602 Shortness of breath: Secondary | ICD-10-CM

## 2011-05-15 DIAGNOSIS — R7309 Other abnormal glucose: Secondary | ICD-10-CM | POA: Diagnosis present

## 2011-05-15 DIAGNOSIS — I214 Non-ST elevation (NSTEMI) myocardial infarction: Secondary | ICD-10-CM | POA: Diagnosis not present

## 2011-05-15 DIAGNOSIS — I498 Other specified cardiac arrhythmias: Secondary | ICD-10-CM | POA: Diagnosis not present

## 2011-05-15 DIAGNOSIS — I509 Heart failure, unspecified: Secondary | ICD-10-CM | POA: Diagnosis not present

## 2011-05-15 DIAGNOSIS — F419 Anxiety disorder, unspecified: Secondary | ICD-10-CM | POA: Diagnosis present

## 2011-05-15 DIAGNOSIS — J9620 Acute and chronic respiratory failure, unspecified whether with hypoxia or hypercapnia: Secondary | ICD-10-CM | POA: Diagnosis present

## 2011-05-15 DIAGNOSIS — J9600 Acute respiratory failure, unspecified whether with hypoxia or hypercapnia: Secondary | ICD-10-CM

## 2011-05-15 DIAGNOSIS — E876 Hypokalemia: Secondary | ICD-10-CM | POA: Diagnosis not present

## 2011-05-15 LAB — CARDIAC PANEL(CRET KIN+CKTOT+MB+TROPI): Troponin I: 0.41 ng/mL (ref <0.30)

## 2011-05-15 LAB — FIBRINOGEN: Fibrinogen: 375 mg/dL (ref 204–475)

## 2011-05-15 LAB — CBC
HCT: 51.1 % — ABNORMAL HIGH (ref 36.0–46.0)
Hemoglobin: 14.5 g/dL (ref 12.0–15.0)
MCH: 31.5 pg (ref 26.0–34.0)
MCHC: 31.3 g/dL (ref 30.0–36.0)
MCV: 100.6 fL — ABNORMAL HIGH (ref 78.0–100.0)
Platelets: 207 10*3/uL (ref 150–400)
RBC: 4.45 MIL/uL (ref 3.87–5.11)
RDW: 13.6 % (ref 11.5–15.5)
WBC: 17.9 10*3/uL — ABNORMAL HIGH (ref 4.0–10.5)

## 2011-05-15 LAB — CREATININE, SERUM
Creatinine, Ser: 0.62 mg/dL (ref 0.50–1.10)
GFR calc Af Amer: >90 mL/min (ref >90)
GFR calc non Af Amer: 90 mL/min — ABNORMAL LOW (ref >90)

## 2011-05-15 LAB — BASIC METABOLIC PANEL
BUN: 9 mg/dL (ref 6–23)
Chloride: 96 mEq/L (ref 96–112)
Creatinine, Ser: 0.46 mg/dL — ABNORMAL LOW (ref 0.50–1.10)
GFR calc Af Amer: >90 mL/min (ref >90)
Glucose, Bld: 187 mg/dL — ABNORMAL HIGH (ref 70–99)

## 2011-05-15 LAB — POCT I-STAT 3, ART BLOOD GAS (G3+)
Acid-Base Excess: 2 mmol/L (ref 0.0–2.0)
O2 Saturation: 97 %
pCO2 arterial: 113.7 mmHg (ref 35.0–45.0)

## 2011-05-15 LAB — URINALYSIS, ROUTINE W REFLEX MICROSCOPIC
Glucose, UA: 100 mg/dL — AB
Ketones, ur: NEGATIVE mg/dL
Leukocytes, UA: NEGATIVE
Protein, ur: >300 mg/dL — AB

## 2011-05-15 LAB — URINE MICROSCOPIC-ADD ON

## 2011-05-15 LAB — POCT I-STAT, CHEM 8
BUN: 10 mg/dL (ref 6–23)
Calcium, Ion: 1.14 mmol/L (ref 1.12–1.32)
HCT: 54 % — ABNORMAL HIGH (ref 36.0–46.0)
TCO2: 36 mmol/L (ref 0–100)

## 2011-05-15 LAB — POCT I-STAT TROPONIN I: Troponin i, poc: 0.07 ng/mL (ref 0.00–0.08)

## 2011-05-15 LAB — GLUCOSE, CAPILLARY
Glucose-Capillary: 137 mg/dL — ABNORMAL HIGH (ref 70–99)
Glucose-Capillary: 118 mg/dL — ABNORMAL HIGH (ref 70–99)

## 2011-05-15 LAB — LACTIC ACID, PLASMA: Lactic Acid, Venous: 1.1 mmol/L (ref 0.5–2.2)

## 2011-05-15 LAB — PROTIME-INR: INR: 1.05 (ref 0.00–1.49)

## 2011-05-15 LAB — TYPE AND SCREEN
ABO/RH(D): O POS
Antibody Screen: NEGATIVE

## 2011-05-15 LAB — ABO/RH: ABO/RH(D): O POS

## 2011-05-15 MED ORDER — DOBUTAMINE IN D5W 4-5 MG/ML-% IV SOLN
2.5000 ug/kg/min | INTRAVENOUS | Status: DC | PRN
  Filled 2011-05-15: qty 250

## 2011-05-15 MED ORDER — PIPERACILLIN-TAZOBACTAM 3.375 G IVPB
3.3750 g | Freq: Once | INTRAVENOUS | Status: AC
  Administered 2011-05-15: 3.375 g via INTRAVENOUS
  Filled 2011-05-15 (×2): qty 50

## 2011-05-15 MED ORDER — ALBUTEROL SULFATE (5 MG/ML) 0.5% IN NEBU
INHALATION_SOLUTION | RESPIRATORY_TRACT | Status: AC
  Filled 2011-05-15: qty 1

## 2011-05-15 MED ORDER — HYDROCORTISONE SOD SUCCINATE 100 MG IJ SOLR
50.0000 mg | Freq: Four times a day (QID) | INTRAMUSCULAR | Status: DC
  Administered 2011-05-15 – 2011-05-16 (×3): 50 mg via INTRAVENOUS
  Filled 2011-05-15 (×8): qty 1

## 2011-05-15 MED ORDER — DEXTROSE 5 % IV SOLN
1.0000 g | INTRAVENOUS | Status: DC
  Administered 2011-05-16 – 2011-05-20 (×5): 1 g via INTRAVENOUS
  Filled 2011-05-15 (×6): qty 10

## 2011-05-15 MED ORDER — PANTOPRAZOLE SODIUM 40 MG IV SOLR
40.0000 mg | Freq: Every day | INTRAVENOUS | Status: DC
  Administered 2011-05-15 – 2011-05-20 (×6): 40 mg via INTRAVENOUS
  Filled 2011-05-15 (×7): qty 40

## 2011-05-15 MED ORDER — INSULIN ASPART 100 UNIT/ML ~~LOC~~ SOLN
0.0000 [IU] | SUBCUTANEOUS | Status: DC
  Administered 2011-05-15 – 2011-05-21 (×4): 1 [IU] via SUBCUTANEOUS
  Administered 2011-05-22: 0 [IU] via SUBCUTANEOUS

## 2011-05-15 MED ORDER — PROPOFOL 10 MG/ML IV EMUL
5.0000 ug/kg/min | Freq: Once | INTRAVENOUS | Status: DC
  Filled 2011-05-15: qty 20

## 2011-05-15 MED ORDER — ASPIRIN 81 MG PO CHEW
324.0000 mg | CHEWABLE_TABLET | Freq: Once | ORAL | Status: DC
  Filled 2011-05-15: qty 1

## 2011-05-15 MED ORDER — NOREPINEPHRINE BITARTRATE 1 MG/ML IJ SOLN
2.0000 ug/min | INTRAVENOUS | Status: AC
  Administered 2011-05-15: 10 ug/min via INTRAVENOUS
  Filled 2011-05-15: qty 4

## 2011-05-15 MED ORDER — FUROSEMIDE 10 MG/ML IJ SOLN
INTRAMUSCULAR | Status: AC
  Administered 2011-05-15: 13:00:00
  Filled 2011-05-15: qty 8

## 2011-05-15 MED ORDER — MIDAZOLAM HCL 2 MG/2ML IJ SOLN
2.0000 mg | INTRAMUSCULAR | Status: DC | PRN
  Administered 2011-05-15 – 2011-05-16 (×3): 2 mg via INTRAVENOUS
  Administered 2011-05-16 – 2011-05-17 (×4): 4 mg via INTRAVENOUS
  Administered 2011-05-17: 2 mg via INTRAVENOUS
  Filled 2011-05-15 (×2): qty 4
  Filled 2011-05-15 (×4): qty 2
  Filled 2011-05-15 (×2): qty 4
  Filled 2011-05-15 (×2): qty 2

## 2011-05-15 MED ORDER — VANCOMYCIN HCL IN DEXTROSE 1-5 GM/200ML-% IV SOLN
1000.0000 mg | Freq: Once | INTRAVENOUS | Status: AC
  Administered 2011-05-15: 1000 mg via INTRAVENOUS
  Filled 2011-05-15: qty 200

## 2011-05-15 MED ORDER — IPRATROPIUM-ALBUTEROL 18-103 MCG/ACT IN AERO
6.0000 | INHALATION_SPRAY | Freq: Four times a day (QID) | RESPIRATORY_TRACT | Status: DC
  Administered 2011-05-15 – 2011-05-17 (×8): 6 via RESPIRATORY_TRACT
  Filled 2011-05-15 (×2): qty 14.7

## 2011-05-15 MED ORDER — DEXTROSE 5 % IV SOLN
500.0000 mg | INTRAVENOUS | Status: DC
  Administered 2011-05-16 – 2011-05-19 (×4): 500 mg via INTRAVENOUS
  Filled 2011-05-15 (×5): qty 500

## 2011-05-15 MED ORDER — IPRATROPIUM BROMIDE 0.02 % IN SOLN
RESPIRATORY_TRACT | Status: AC
  Filled 2011-05-15: qty 2.5

## 2011-05-15 MED ORDER — DEXTROSE 5 % IV SOLN
2.0000 g | Freq: Once | INTRAVENOUS | Status: AC
  Administered 2011-05-15: 2 g via INTRAVENOUS
  Filled 2011-05-15 (×2): qty 2

## 2011-05-15 MED ORDER — SODIUM CHLORIDE 0.9 % IV BOLUS (SEPSIS)
1000.0000 mL | Freq: Once | INTRAVENOUS | Status: AC
  Administered 2011-05-15: 1000 mL via INTRAVENOUS

## 2011-05-15 MED ORDER — SODIUM CHLORIDE 0.9 % IV BOLUS (SEPSIS): 500.0000 mL | INTRAVENOUS | Status: DC | PRN

## 2011-05-15 MED ORDER — MIDAZOLAM HCL 2 MG/2ML IJ SOLN
INTRAMUSCULAR | Status: AC
  Administered 2011-05-15: 0.5 mg via INTRAVENOUS
  Filled 2011-05-15: qty 2

## 2011-05-15 MED ORDER — SODIUM CHLORIDE 0.9 % IV SOLN
INTRAVENOUS | Status: DC
  Administered 2011-05-15 – 2011-05-20 (×6): via INTRAVENOUS
  Administered 2011-05-21: 100 mL/h via INTRAVENOUS
  Administered 2011-05-23: 17:00:00 via INTRAVENOUS

## 2011-05-15 MED ORDER — HEPARIN SODIUM (PORCINE) 5000 UNIT/ML IJ SOLN
5000.0000 [IU] | Freq: Three times a day (TID) | INTRAMUSCULAR | Status: DC
  Administered 2011-05-15 – 2011-05-26 (×35): 5000 [IU] via SUBCUTANEOUS
  Filled 2011-05-15 (×39): qty 1

## 2011-05-15 MED ORDER — SODIUM CHLORIDE 0.9 % IV BOLUS (SEPSIS)
25.0000 mL/kg | Freq: Once | INTRAVENOUS | Status: AC
  Administered 2011-05-15: 815 mL via INTRAVENOUS

## 2011-05-15 MED ORDER — SODIUM CHLORIDE 0.9 % IV SOLN: 250.0000 mL | INTRAVENOUS | Status: DC | PRN

## 2011-05-15 MED ORDER — FENTANYL CITRATE 0.05 MG/ML IJ SOLN
25.0000 ug | INTRAMUSCULAR | Status: DC | PRN
  Administered 2011-05-15 – 2011-05-16 (×7): 50 ug via INTRAVENOUS
  Filled 2011-05-15 (×9): qty 2

## 2011-05-15 MED ORDER — DEXTROSE 5 % IV SOLN
500.0000 mg | Freq: Once | INTRAVENOUS | Status: AC
  Administered 2011-05-15: 500 mg via INTRAVENOUS

## 2011-05-15 MED ORDER — DEXTROSE 5 % IV SOLN
2.0000 ug/min | INTRAVENOUS | Status: DC | PRN
  Administered 2011-05-16: 4 ug/min via INTRAVENOUS
  Filled 2011-05-15 (×2): qty 4

## 2011-05-15 MED ORDER — SUCCINYLCHOLINE CHLORIDE 20 MG/ML IJ SOLN
100.0000 mg | Freq: Once | INTRAMUSCULAR | Status: DC
  Administered 2011-05-15: 100 mg via INTRAVENOUS
  Filled 2011-05-15: qty 5

## 2011-05-15 MED ORDER — VASOPRESSIN 20 UNIT/ML IJ SOLN
0.0300 [IU]/min | INTRAVENOUS | Status: DC | PRN
  Filled 2011-05-15: qty 2.5

## 2011-05-15 MED ORDER — FENTANYL CITRATE 0.05 MG/ML IJ SOLN
50.0000 ug | Freq: Once | INTRAMUSCULAR | Status: AC
  Administered 2011-05-15: 50 ug via INTRAVENOUS
  Filled 2011-05-15: qty 2

## 2011-05-15 MED ORDER — MIDAZOLAM HCL 2 MG/2ML IJ SOLN
2.0000 mg | Freq: Once | INTRAMUSCULAR | Status: AC
  Administered 2011-05-15: 2 mg via INTRAVENOUS
  Filled 2011-05-15: qty 2

## 2011-05-15 MED ORDER — MIDAZOLAM HCL 2 MG/2ML IJ SOLN
0.5000 mg | Freq: Once | INTRAMUSCULAR | Status: AC
  Administered 2011-05-15: 0.5 mg via INTRAVENOUS

## 2011-05-15 MED ORDER — SODIUM CHLORIDE 0.9 % IV BOLUS (SEPSIS): 500.0000 mL | Freq: Once | INTRAVENOUS | Status: AC

## 2011-05-15 MED ORDER — METHYLPREDNISOLONE SODIUM SUCC 125 MG IJ SOLR
INTRAMUSCULAR | Status: AC
  Administered 2011-05-15: 13:00:00
  Filled 2011-05-15: qty 2

## 2011-05-15 MED ORDER — NOREPINEPHRINE BITARTRATE 1 MG/ML IJ SOLN: 2.0000 ug/kg/min | Freq: Once | INTRAMUSCULAR | Status: DC

## 2011-05-15 MED ORDER — DEXTROSE 10 % IV SOLN: INTRAVENOUS | Status: DC

## 2011-05-15 NOTE — Progress Notes (Signed)
1215 1230 RT Note: Pt brought in via EMS, ABG drawn & put on NIV 14/6, 40% until MD able to assess. Pt intubated 7.5@ 21, CXR, ABG pending, pt tolerating current vent settings, RT will continue to monitor.

## 2011-05-15 NOTE — ED Notes (Signed)
Report called to [facility] 2100

## 2011-05-15 NOTE — H&P (Signed)
Name: [name] [name] MRN: [id number] DOB: [birth year]    LOS: 0  [place] [facility]  History of Present Illness:   77 YOWF (oxygen dependant 2.5 liters), presented via EMS on 3/20 w/ CC:  not feeling well for 2-3 days was ambulatory upon EMS arrival, but patient's condition deteriorated in route. Upon arrival to the ED, she was nonverbal, and leaning forward, with expiratory wheezing. Patient was placed on BiPAP immediately. No reported fevers. No chest pains. Failed attempt at NIPPV and therefore intubated. She had ordered and given Solu-Medrol and multiple breathing treatments in route.  Lines / Drains: OETT 3/20>>> Left IJ CVL 3/20>>>  Cultures: Sputum 3/20>>> BCX23/20>>> UC 3/20>>> PCT 3/20>>>  Antibiotics: Rocephin 3/20>>> azithro 3/20>>>  Tests / Events: Echo 3/20>>>  The patient is sedated, intubated and unable to provide history, which was obtained for available medical records.    Past Medical History  Diagnosis Date  . Hypertension   . Hyperlipidemia   . Cerebrovascular disease, unspecified   . Unspecified glaucoma   . Obesity, unspecified   . COPD (chronic obstructive pulmonary disease)   . Coronary artery disease     cath 04/06/10: LAD occluded with R-L collats; med Rx WTC....probable V. Tach...med Rx  . Ventricular tachycardia   . SVT (supraventricular tachycardia)   . AAA (abdominal aortic aneurysm)    Past Surgical History  Procedure Date  . Cystoscopy   . Bladder tumor excision     resection of 5 bladder, and fulguration of 1 small bladder tumor  . Cystoscopy     cold cup bladder biopsy of five tumors, and fulguration of one tumor  . Lung surgery     Remote right lung  . Tubal ligation     Bilateral   Prior to Admission medications   Medication Sig Start Date End Date Taking? Authorizing Provider  acetaminophen-codeine (TYLENOL #3) 300-30 MG per tablet Take 1 tablet by mouth 3 (three) times daily.     Historical Provider, MD  aspirin 81  MG EC tablet Take 81 mg by mouth daily.      Historical Provider, MD  Aspirin-Acetaminophen 500-325 MG PACK Take by mouth. (Goody Body Pain) as needed     Historical Provider, MD  atorvastatin (LIPITOR) 80 MG tablet Take 40 mg by mouth daily.     Historical Provider, MD  budesonide-formoterol (SYMBICORT) 80-4.5 MCG/ACT inhaler Inhale 2 puffs into the lungs 2 (two) times daily.      Historical Provider, MD  furosemide (LASIX) 20 MG tablet Take 1 tablet by mouth daily as needed. For swelling 09/29/10   Historical Provider, MD  ipratropium-albuterol (DUONEB) 0.5-2.5 (3) MG/3ML SOLN Take 3 mLs by nebulization daily.      Historical Provider, MD  metoprolol (TOPROL-XL) 50 MG 24 hr tablet TAKE 1/2 TABLET TWICE DAILY 10/23/10   Lewayne Bunting, MD  nitroGLYCERIN (NITROSTAT) 0.4 MG SL tablet Place 0.4 mg under the tongue every 5 (five) minutes as needed. Chest pain 07/10/10   Lewayne Bunting, MD  Oxygen-Helium 20-80 % KIT Inhale into the lungs. 2.5 Liters, use as needed with exertion     Historical Provider, MD  PLAVIX 75 MG tablet Take 1 tablet by mouth Daily. 10/08/10   Historical Provider, MD  potassium chloride SA (K-DUR,KLOR-CON) 20 MEQ tablet Take 20 mEq by mouth 2 (two) times daily.     Historical Provider, MD  travoprost, benzalkonium, (TRAVATAN) 0.004 % ophthalmic solution Place 1 drop into both eyes at bedtime.  Historical Provider, MD   Allergies No Known Allergies  Family History Family History  Problem Relation Age of Onset  . Heart disease      Social History  reports that she has been smoking Cigarettes.  She has a 17.4 pack-year smoking history. She has never used smokeless tobacco. She reports that she does not drink alcohol or use illicit drugs.  Review Of Systems  11 points review of systems is negative with an exception of listed in HPI.  Vital Signs: Temp:  [100.2 F (37.9 C)] 100.2 F (37.9 C) (03/20 1247) Pulse Rate:  [129-159] 159  (03/20 1243) Resp:  [12-24] 22   (03/20 1237) BP: (100-156)/(67-134) 156/134 mmHg (03/20 1243) SpO2:  [84 %-100 %] 94 % (03/20 1243) FiO2 (%):  [50 %-100 %] 50 % (03/20 1257) Weight:  [72.6 kg (160 lb 0.9 oz)] 72.6 kg (160 lb 0.9 oz) (03/20 1247)        Physical Examination: General:  Chronically ill appearing white female, currently sedated on vent Neuro:  Moved all ext prior to admit HEENT:  Orally intubated, no JVD Cardiovascular:  rrr Lungs:  Scattered rhonchi, prolonged exp wheeze Abdomen:  Soft, non-tender Musculoskeletal: intact Skin:  intact  Ventilator settings: Vent Mode:  [-] PRVC FiO2 (%):  [50 %-100 %] 50 % Set Rate:  [12 bmp] 12 bmp Vt Set:  [470 mL-530 mL] 530 mL PEEP:  [5 cmH20] 5 cmH20 Plateau Pressure:  [17 cmH20] 17 cmH20  Labs and Imaging:   Lab 05/15/11 1249 05/15/11 1126  NA 138 137  K 4.4 4.7  CL 96 96  CO2 -- 26  BUN 10 9  CREATININE 0.80 0.46*  GLUCOSE 193* 187*    Lab 05/15/11 1249 05/15/11 1126  HGB 18.4* 16.0*  HCT 54.0* 51.1*  WBC -- 14.1*  PLT -- 214    Assessment and Plan:  Acute-on-chronic respiratory failure in setting of CAP (community acquired pneumonia) and Acute exacerbation of chronic obstructive pulmonary disease (COPD): has significant autopeep when rr > 20  plan: -sedation protocol -full vent support: careful watch for auto-peep -scheduled BDs -systemic steroids -f/u abg and CXR  SIRS (systemic inflammatory response syndrome) with resultant Septic shock: autopeep may be contributing to  Her shock  Lab 05/15/11 1300 05/15/11 1126  PROCALCITON -- --  WBC -- 14.1*  LATICACIDVEN 1.1 --  plan: -EGDT protocol -broad spec abx -trend pct  Acute encephalopathy: multifactorial, mostly hypercarbia, but may be complicated by acute infection Plan: -supportive care -serial neuro checks   Hyperglycemia Plan: -ssi  Best practices / Disposition: -->ICU status under PCCM -->full code -->Heparin for DVT Px -->Protonix for GI Px -->ventilator  bundle -->diet: NPO -->family updated at bedside  The patient is critically ill with multiple organ systems failure and requires high complexity decision making for assessment and support, frequent evaluation and titration of therapies, application of advanced monitoring technologies and extensive interpretation of multiple databases. Critical Care Time devoted to patient care services described in this note is 85 minutes.  BABCOCK,PETE 05/15/2011, 1:33 PM  Septic shock with RLL infiltrate and VDRF, will start septic shock protocol.  CAP coverage and will follow up.  Patient seen and examined, agree with above note.  I dictated the care and orders written for this patient under my direction.  Koren Bound, M.D. [phone]

## 2011-05-15 NOTE — Progress Notes (Signed)
CRITICAL VALUE ALERT  Critical value received:  Troponin 0.41 Date of notification: 05/15/11  Time of notification: 1448 Critical value read back yes  Nurse who received alert:  Silverio Lay MD notified (1st page): Dr Vassie Loll  Time of first page: 1448 MD notified (2nd page):  Time of second page:  Responding MD: Dr Vassie Loll on unit and informed  Time MD responded: [phone]

## 2011-05-15 NOTE — Progress Notes (Signed)
ANTIBIOTIC CONSULT NOTE - INITIAL  Pharmacy Consult for Rocephin + Azithromycin Indication: Empiric CAP and sepsis coverage  No Known Allergies  Patient Measurements: Height: 5\' 9"  (175.3 cm) Weight: 160 lb 0.9 oz (72.6 kg) IBW/kg (Calculated) : 66.2   Vital Signs: Temp: 100.2 F (37.9 C) (03/20 1247) Temp src: Rectal (03/20 1247) BP: 112/63 mmHg (03/20 1357) Pulse Rate: 82  (03/20 1357) Intake/Output from previous day:   Intake/Output from this shift: Total I/O In: 1000 [I.V.:1000] Out: 250 [Urine:250]  Labs:  Basename 05/15/11 1249 05/15/11 1126  WBC -- 14.1*  HGB 18.4* 16.0*  PLT -- 214  LABCREA -- --  CREATININE 0.80 0.46*   Estimated Creatinine Clearance: 69.4 ml/min (by C-G formula based on Cr of 0.8). No results found for this basename: VANCOTROUGH:2,VANCOPEAK:2,VANCORANDOM:2,GENTTROUGH:2,GENTPEAK:2,GENTRANDOM:2,TOBRATROUGH:2,TOBRAPEAK:2,TOBRARND:2,AMIKACINPEAK:2,AMIKACINTROU:2,AMIKACIN:2, in the last 72 hours   Microbiology: No results found for this or any previous visit (from the past 720 hour(s)).  Medical History: Past Medical History  Diagnosis Date  . Hypertension   . Hyperlipidemia   . Cerebrovascular disease, unspecified   . Unspecified glaucoma   . Obesity, unspecified   . COPD (chronic obstructive pulmonary disease)   . Coronary artery disease     cath 04/06/10: LAD occluded with R-L collats; med Rx WTC....probable V. Tach...med Rx  . Ventricular tachycardia   . SVT (supraventricular tachycardia)   . AAA (abdominal aortic aneurysm)     Medications:  Prescriptions prior to admission  Medication Sig Dispense Refill  . acetaminophen-codeine (TYLENOL #3) 300-30 MG per tablet Take 1 tablet by mouth 3 (three) times daily.       Marland Kitchen aspirin 81 MG EC tablet Take 81 mg by mouth daily.        . Aspirin-Acetaminophen 500-325 MG PACK Take by mouth. (Goody Body Pain) as needed       . atorvastatin (LIPITOR) 80 MG tablet Take 40 mg by mouth daily.         . budesonide-formoterol (SYMBICORT) 80-4.5 MCG/ACT inhaler Inhale 2 puffs into the lungs 2 (two) times daily.        . furosemide (LASIX) 20 MG tablet Take 1 tablet by mouth daily as needed. For swelling      . ipratropium-albuterol (DUONEB) 0.5-2.5 (3) MG/3ML SOLN Take 3 mLs by nebulization daily.        . metoprolol (TOPROL-XL) 50 MG 24 hr tablet TAKE 1/2 TABLET TWICE DAILY  30 tablet  12  . nitroGLYCERIN (NITROSTAT) 0.4 MG SL tablet Place 0.4 mg under the tongue every 5 (five) minutes as needed. Chest pain      . Oxygen-Helium 20-80 % KIT Inhale into the lungs. 2.5 Liters, use as needed with exertion       . PLAVIX 75 MG tablet Take 1 tablet by mouth Daily.      . potassium chloride SA (K-DUR,KLOR-CON) 20 MEQ tablet Take 20 mEq by mouth 2 (two) times daily.       . travoprost, benzalkonium, (TRAVATAN) 0.004 % ophthalmic solution Place 1 drop into both eyes at bedtime.         Scheduled:    . albuterol      . albuterol-ipratropium  6 puff Inhalation Q6H  . aspirin  324 mg Oral Once  . azithromycin  500 mg Intravenous Once  . cefTRIAXone (ROCEPHIN)  IV  2 g Intravenous Once  . fentaNYL  50 mcg Intravenous Once  . furosemide      . heparin  5,000 Units Subcutaneous Q8H  . hydrocortisone  sodium succinate  50 mg Intravenous Q6H  . insulin aspart  0-3 Units Subcutaneous Q4H  . ipratropium      . methylPREDNISolone sodium succinate      . midazolam  0.5 mg Intravenous Once  . midazolam  2 mg Intravenous Once  . norepinephrine (LEVOPHED) Adult infusion  2-50 mcg/min Intravenous To Major  . pantoprazole (PROTONIX) IV  40 mg Intravenous QHS  . piperacillin-tazobactam (ZOSYN)  IV  3.375 g Intravenous Once  . propofol  5-70 mcg/kg/min Intravenous Once  . sodium chloride  1,000 mL Intravenous Once  . sodium chloride  25 mL/kg Intravenous Once  . sodium chloride  500 mL Intravenous Once  . succinylcholine  100 mg Intravenous Once  . vancomycin  1,000 mg Intravenous Once  . DISCONTD:  norepinephrine  2 mcg/kg/min Intravenous Once   Assessment: 71 y.o. F to start Rocephin and Azithromycin for CAP and empiric sepsis coverage. The patient has already received Zosyn x 1 dose in the ED and has orders for the 1st doses of Azithro and Rocephin already entered.  Goal of Therapy:  Eradication of infection  Plan:  1. Rocephin 1g IV every 24 hours 2. Azithromycin 500 mg IV every 24 hours 3. Will continue to follow renal function, culture results, LOT, and antibiotic de-escalation plans   Georgina Pillion, PharmD, BCPS Clinical Pharmacist Pager: [phone] 05/15/2011 2:46 PM

## 2011-05-15 NOTE — ED Notes (Signed)
Patient presents to ed via ems , states patients husband said patient hasn't been feeling well for 2-3 days, patient was ambulatory upon ems arrival, however patients condition deterioated enroute, upon arrival to ed patient was non-verbal, leaning forward with exp. Wheezing, Dr. Patrica Duel at bedside.  1205 patient placed on bi-pap per resp. Patient unable to tolerate, 1210- patient was given etomidate 20 mg IV right arm and succinylcholine 100mg  IV right arm. Intubated with # 7.5 tube 23 @lip , Dr. Patrica Duel unable to hear breath sounds on the left side, tube pulled back to 21 @ lip, bilateral breath sounds present with positive color change. Vent settings T.V, -500, o2 50%, rate 12, 5/peep.

## 2011-05-15 NOTE — Procedures (Signed)
Central Venous Catheter Insertion Procedure Note [name] [name] [id number] 1940/03/02  Procedure: Insertion of Central Venous Catheter Indications: Assessment of intravascular volume and Drug and/or fluid administration  Procedure Details Consent: Unable to obtain consent because of emergent medical necessity. Time Out: Verified patient identification, verified procedure, site/side was marked, verified correct patient position, special equipment/implants available, medications/allergies/relevent history reviewed, required imaging and test results available.  Performed  Maximum sterile technique was used including antiseptics, cap, gloves, gown, hand hygiene, mask and sheet. Skin prep: Chlorhexidine; local anesthetic administered A antimicrobial bonded/coated triple lumen catheter was placed in the left internal jugular vein using the Seldinger technique.  Evaluation Blood flow good Complications: No apparent complications Patient did tolerate procedure well. Chest X-ray ordered to verify placement.  CXR: pending.  Procedure performed under direct supervision of Dr. Molli Knock and with ultrasound guidance.    [name] Brim, NP-C [facility] Pgr: [phone]  Patient seen and examined, agree with above note.  I dictated the care and orders written for this patient under my direction.  Koren Bound, M.D. [phone]

## 2011-05-15 NOTE — ED Provider Notes (Signed)
History     CSN: [id number]  Arrival date & time 05/15/11  1205   First MD Initiated Contact with Patient 05/15/11 1204      Chief Complaint  Patient presents with  . Respiratory Distress   71 year old female seen for respiratory difficulties. Apparently, presented via EMS apparently, had been not feeling well for 2-3 days was ambulatory upon EMS arrival, but patient's condition deteriorated in route. Upon arrival to the ED, she was nonverbal, and leaning forward, with expiratory wheezing. Patient was placed on BiPAP immediately and seen by another physician prior to my seeing her. No other history was obtained at this time. No reported fevers. No chest pains. Patient was intubated without difficulty. She had ordered and given Solu-Medrol and multiple breathing treatments in route. (Consider location/radiation/quality/duration/timing/severity/associated sxs/prior treatment) HPI  Past Medical History  Diagnosis Date  . Hypertension   . Hyperlipidemia   . Cerebrovascular disease, unspecified   . Unspecified glaucoma   . Obesity, unspecified   . COPD (chronic obstructive pulmonary disease)   . Coronary artery disease     cath 04/06/10: LAD occluded with R-L collats; med Rx WTC....probable V. Tach...med Rx  . Ventricular tachycardia   . SVT (supraventricular tachycardia)   . AAA (abdominal aortic aneurysm)     Past Surgical History  Procedure Date  . Cystoscopy   . Bladder tumor excision     resection of 5 bladder, and fulguration of 1 small bladder tumor  . Cystoscopy     cold cup bladder biopsy of five tumors, and fulguration of one tumor  . Lung surgery     Remote right lung  . Tubal ligation     Bilateral    Family History  Problem Relation Age of Onset  . Heart disease      History  Substance Use Topics  . Smoking status: Current Everyday Smoker -- 0.3 packs/day for 58 years    Types: Cigarettes  . Smokeless tobacco: Never Used  . Alcohol Use: No    OB  History    Grav Para Term Preterm Abortions TAB SAB Ect Mult Living                  Review of Systems  Allergies  Review of patient's allergies indicates no known allergies.  Home Medications   Current Outpatient Rx  Name Route Sig Dispense Refill  . ACETAMINOPHEN-CODEINE #3 300-30 MG PO TABS Oral Take 1 tablet by mouth 3 (three) times daily.     . ASPIRIN 81 MG PO TBEC Oral Take 81 mg by mouth daily.      . ASPIRIN-ACETAMINOPHEN 500-325 MG PO PACK Oral Take by mouth. (Goody Body Pain) as needed     . ATORVASTATIN CALCIUM 80 MG PO TABS Oral Take 40 mg by mouth daily.     . BUDESONIDE-FORMOTEROL FUMARATE 80-4.5 MCG/ACT IN AERO Inhalation Inhale 2 puffs into the lungs 2 (two) times daily.      . FUROSEMIDE 20 MG PO TABS Oral Take 1 tablet by mouth daily as needed. For swelling    . IPRATROPIUM-ALBUTEROL 0.5-2.5 (3) MG/3ML IN SOLN Nebulization Take 3 mLs by nebulization daily.      Marland Kitchen METOPROLOL SUCCINATE ER 50 MG PO TB24  TAKE 1/2 TABLET TWICE DAILY 30 tablet 12  . NITROGLYCERIN 0.4 MG SL SUBL Sublingual Place 0.4 mg under the tongue every 5 (five) minutes as needed. Chest pain    . OXYGEN-HELIUM 20-80 % IN KIT Inhalation Inhale into the lungs. 2.5  Liters, use as needed with exertion     . PLAVIX 75 MG PO TABS Oral Take 1 tablet by mouth Daily.    Marland Kitchen POTASSIUM CHLORIDE CRYS ER 20 MEQ PO TBCR Oral Take 20 mEq by mouth 2 (two) times daily.     . TRAVOPROST 0.004 % OP SOLN Both Eyes Place 1 drop into both eyes at bedtime.        BP 156/134  Pulse 159  Temp(Src) 100.2 F (37.9 C) (Rectal)  Resp 22  Ht 5\' 9"  (1.753 m)  Wt 160 lb 0.9 oz (72.6 kg)  BMI 23.64 kg/m2  SpO2 94%  Physical Exam  ED Course  Procedures (including critical care time)  Labs Reviewed  CBC - Abnormal; Notable for the following:    WBC 14.1 (*)    Hemoglobin 16.0 (*)    HCT 51.1 (*)    MCV 100.6 (*)    All other components within normal limits  POCT I-STAT 3, BLOOD GAS (G3+) - Abnormal; Notable for the  following:    pH, Arterial 7.121 (*)    pCO2 arterial 113.7 (*)    pO2, Arterial 123.0 (*)    Bicarbonate 37.1 (*)    All other components within normal limits  POCT I-STAT, CHEM 8 - Abnormal; Notable for the following:    Glucose, Bld 193 (*)    Hemoglobin 18.4 (*)    HCT 54.0 (*)    All other components within normal limits  POCT I-STAT TROPONIN I  CARDIAC PANEL(CRET KIN+CKTOT+MB+TROPI)  BASIC METABOLIC PANEL  PRO B NATRIURETIC PEPTIDE  CULTURE, BLOOD (ROUTINE X 2)  CULTURE, BLOOD (ROUTINE X 2)  LACTIC ACID, PLASMA  URINALYSIS, ROUTINE W REFLEX MICROSCOPIC  URINE CULTURE   Dg Chest Portable 1 View  05/15/2011  *RADIOLOGY REPORT*  Clinical Data: Respiratory stress  PORTABLE CHEST - 1 VIEW  Comparison: Chest radiograph 09/26/2010  Findings: Endotracheal tube is 6 cm from carina.  Stable cardiac silhouette.  There is mild basilar atelectasis similar to prior. No pneumothorax.  Central venous congestion is present.  IMPRESSION:   Endotracheal tube in good position.  Original Report Authenticated By: Genevive Bi, M.D.     No diagnosis found.    MDM  She was seen immediately for respiratory difficulty and was emergently intubated.  Procedures: Rapid sequence intubation using etomidate, succinylcholine, and kaleidoscope with a 7.5 endotracheal tube. Patient tolerated this well without any complications.  EKG discuss with Dr. Patty Sermons. Recommended admission to the pulmonary critical care service. No criteria for systemic at this time.  Date: 05/15/2011  Rate: 157  Rhythm: sinus tachycardia  QRS Axis: normal  Intervals: normal  ST/T Wave abnormalities: nonspecific ST/T changes  Conduction Disutrbances:none  Narrative Interpretation:   Old EKG Reviewed: changes noted   Pulmonary critical care service contacted directly after the patient was intubated. He will be coming to see the patient.   Patient continues to be hypotensive. The ventilator was on looked to see if this  may have been a source of her hypotension. This was put back up and did not seem to be a contributing factor. She does have 2 large-bore IVs and is receiving a rapid infusion bolus. We'll also start levophed.   Blood cultures. Antibiotics have all been ordered.   Needs repeat ABG shortly.  Initial ABG showed a pH of 7.12, PCO2 of 113, PO2 of 123 and a bicarbonate of 37.   CRITICAL CARE Performed by: Orlene Och.   Total critical care time: 40  Critical care time was exclusive of separately billable procedures and treating other patients.  Critical care was necessary to treat or prevent imminent or life-threatening deterioration.  Critical care was time spent personally by me on the following activities: development of treatment plan with patient and/or surrogate as well as nursing, discussions with consultants, evaluation of patient's response to treatment, examination of patient, obtaining history from patient or surrogate, ordering and performing treatments and interventions, ordering and review of laboratory studies, ordering and review of radiographic studies, pulse oximetry and re-evaluation of patient's condition.         Initial troponin was normal at 0.07 did have a low-grade rectal temperature. Electrolytes, kidney function, and glucose are reviewed.        Michelle Vanhise A. Patrica Duel, MD 05/16/11 2143

## 2011-05-15 NOTE — ED Notes (Signed)
Vent settings: FIO2 50%, TV: 550, peep of 5, rate of 16, PRVC

## 2011-05-16 DIAGNOSIS — J441 Chronic obstructive pulmonary disease with (acute) exacerbation: Secondary | ICD-10-CM

## 2011-05-16 DIAGNOSIS — R7309 Other abnormal glucose: Secondary | ICD-10-CM

## 2011-05-16 LAB — BASIC METABOLIC PANEL
CO2: 27 mEq/L (ref 19–32)
Calcium: 8.4 mg/dL (ref 8.4–10.5)
Creatinine, Ser: 0.56 mg/dL (ref 0.50–1.10)
GFR calc non Af Amer: >90 mL/min (ref >90)

## 2011-05-16 LAB — CBC
MCH: 31.6 pg (ref 26.0–34.0)
MCHC: 31.9 g/dL (ref 30.0–36.0)
MCV: 99.1 fL (ref 78.0–100.0)
Platelets: 174 10*3/uL (ref 150–400)
RBC: 4.46 MIL/uL (ref 3.87–5.11)
RDW: 13.9 % (ref 11.5–15.5)

## 2011-05-16 LAB — BLOOD GAS, ARTERIAL
Acid-Base Excess: 0.6 mmol/L (ref 0.0–2.0)
Bicarbonate: 26.8 mEq/L — ABNORMAL HIGH (ref 20.0–24.0)
Drawn by: 347621
FIO2: 0.5 %
PEEP: 5 cmH2O
RATE: 16 resp/min
pCO2 arterial: 60.1 mmHg (ref 35.0–45.0)
pO2, Arterial: 155 mmHg — ABNORMAL HIGH (ref 80.0–100.0)

## 2011-05-16 LAB — MAGNESIUM: Magnesium: 1.3 mg/dL — ABNORMAL LOW (ref 1.5–2.5)

## 2011-05-16 LAB — GLUCOSE, CAPILLARY
Glucose-Capillary: 105 mg/dL — ABNORMAL HIGH (ref 70–99)
Glucose-Capillary: 99 mg/dL (ref 70–99)

## 2011-05-16 LAB — URINE CULTURE: Colony Count: NO GROWTH

## 2011-05-16 MED ORDER — CLOPIDOGREL BISULFATE 75 MG PO TABS
75.0000 mg | ORAL_TABLET | Freq: Every day | ORAL | Status: DC
  Administered 2011-05-16 – 2011-06-04 (×19): 75 mg
  Filled 2011-05-16 (×19): qty 1

## 2011-05-16 MED ORDER — METHYLPREDNISOLONE SODIUM SUCC 125 MG IJ SOLR
60.0000 mg | Freq: Three times a day (TID) | INTRAMUSCULAR | Status: DC
  Administered 2011-05-16 – 2011-05-18 (×6): 60 mg via INTRAVENOUS
  Filled 2011-05-16: qty 2
  Filled 2011-05-16 (×2): qty 0.96
  Filled 2011-05-16: qty 2
  Filled 2011-05-16 (×4): qty 0.96
  Filled 2011-05-16: qty 2

## 2011-05-16 MED ORDER — TRAVOPROST (BAK FREE) 0.004 % OP SOLN
1.0000 [drp] | Freq: Every day | OPHTHALMIC | Status: DC
  Administered 2011-05-16 – 2011-06-03 (×19): 1 [drp] via OPHTHALMIC
  Filled 2011-05-16 (×3): qty 2.5

## 2011-05-16 MED ORDER — FENTANYL CITRATE 0.05 MG/ML IJ SOLN
50.0000 ug | INTRAMUSCULAR | Status: DC | PRN
  Administered 2011-05-16: 100 ug via INTRAVENOUS
  Administered 2011-05-17: 25 ug via INTRAVENOUS
  Administered 2011-05-17: 100 ug via INTRAVENOUS
  Filled 2011-05-16 (×3): qty 2

## 2011-05-16 MED ORDER — MAGNESIUM SULFATE 40 MG/ML IJ SOLN
2.0000 g | Freq: Once | INTRAMUSCULAR | Status: AC
  Administered 2011-05-16: 2 g via INTRAVENOUS
  Filled 2011-05-16 (×2): qty 50

## 2011-05-16 MED ORDER — POTASSIUM CHLORIDE 20 MEQ/15ML (10%) PO LIQD
40.0000 meq | Freq: Once | ORAL | Status: AC
  Administered 2011-05-16: 40 meq
  Filled 2011-05-16: qty 30

## 2011-05-16 MED ORDER — POTASSIUM CHLORIDE 20 MEQ/15ML (10%) PO LIQD
ORAL | Status: AC
  Filled 2011-05-16: qty 30

## 2011-05-16 MED ORDER — CHLORHEXIDINE GLUCONATE 0.12 % MT SOLN
15.0000 mL | Freq: Two times a day (BID) | OROMUCOSAL | Status: DC
  Administered 2011-05-16 – 2011-05-23 (×15): 15 mL via OROMUCOSAL
  Filled 2011-05-16 (×16): qty 15

## 2011-05-16 MED ORDER — FENTANYL CITRATE 0.05 MG/ML IJ SOLN
50.0000 ug | INTRAMUSCULAR | Status: DC | PRN
  Administered 2011-05-16: 100 ug via INTRAVENOUS
  Filled 2011-05-16: qty 2

## 2011-05-16 MED ORDER — CLOPIDOGREL BISULFATE 75 MG PO TABS
75.0000 mg | ORAL_TABLET | Freq: Every day | ORAL | Status: DC
  Filled 2011-05-16: qty 1

## 2011-05-16 MED ORDER — BIOTENE DRY MOUTH MT LIQD
15.0000 mL | Freq: Four times a day (QID) | OROMUCOSAL | Status: DC
  Administered 2011-05-16 – 2011-05-23 (×28): 15 mL via OROMUCOSAL

## 2011-05-16 MED ORDER — ASPIRIN 81 MG PO CHEW
81.0000 mg | CHEWABLE_TABLET | Freq: Every day | ORAL | Status: DC
  Administered 2011-05-17 – 2011-05-18 (×2): 81 mg
  Filled 2011-05-16: qty 1

## 2011-05-16 NOTE — Progress Notes (Signed)
Called the Box to report. Waiting for call back.

## 2011-05-16 NOTE — Significant Event (Signed)
CRITICAL VALUE ALERT  Critical value received:  CO2 60.1,Ph-7.27, Bi carb-26.8, O2-155  Date of notification:  05/16/2011  Time of notification:  0602  Critical value read back:yes  Nurse who received alert:  Gerre Pebbles, RN  MD notified (1st page):  Dr Deterding  Time of first page:  0602  MD notified (2nd page):  Time of second page:  Responding MD:  Dr Darrick Penna  Time MD responded:  [phone]

## 2011-05-16 NOTE — Progress Notes (Signed)
05-16-10 UR completed. Ronny Flurry RN BSN

## 2011-05-16 NOTE — Progress Notes (Signed)
Name: [name] [name] MRN: [id number] DOB: 1940/12/30    LOS: 1  [facility]  History of Present Illness:   15 YOWF COPD (oxygen dependant 2.5 liters), presented via EMS on 3/20 w/ CC:  not feeling well for 2-3 days was ambulatory upon EMS arrival, but patient's condition deteriorated in route. Upon arrival to the ED, she was nonverbal, and leaning forward, with expiratory wheezing. Patient was placed on BiPAP immediately. No reported fevers. No chest pains. Failed attempt at NIPPV and therefore intubated. She was given Solu-Medrol and multiple breathing treatments in route. Required pressors transiently post intubation  Lines / Drains: OETT 3/20>>> Left IJ CVL 3/20>>>  Cultures: Sputum 3/20>>> BCX23/20>>> UC 3/20>>> PCT 3/20>>>  Antibiotics: Rocephin 3/20>>> azithro 3/20>>>  Tests / Events: Echo 3/20>>>    Past Medical History  Diagnosis Date  . Hypertension   . Hyperlipidemia   . Cerebrovascular disease, unspecified   . Unspecified glaucoma   . Obesity, unspecified   . COPD (chronic obstructive pulmonary disease)   . Coronary artery disease     cath 04/06/10: LAD occluded with R-L collats; med Rx WTC....probable V. Tach...med Rx  . Ventricular tachycardia   . SVT (supraventricular tachycardia)   . AAA (abdominal aortic aneurysm)    Denies CP, breathing better Vital Signs: Temp:  [98.7 F (37.1 C)-100.2 F (37.9 C)] 99.1 F (37.3 C) (03/21 0758) Pulse Rate:  [65-159] 73  (03/21 0700) Resp:  [12-24] 16  (03/21 0700) BP: (76-156)/(47-134) 99/51 mmHg (03/21 0700) SpO2:  [84 %-100 %] 97 % (03/21 0831) FiO2 (%):  [30 %-100 %] 30.3 % (03/21 0918) Weight:  [72.6 kg (160 lb 0.9 oz)-75.1 kg (165 lb 9.1 oz)] 75.1 kg (165 lb 9.1 oz) (03/21 0432) CVP:  [11 mmHg-17 mmHg] 16 mmHg    . sodium chloride 100 mL/hr at 05/15/11 1501  . dextrose    . DISCONTD: vasopressin (PITRESSIN) infusion - *FOR SHOCK*     I/O last 3 completed shifts: In: 3538.9  [I.V.:3128.9; IV Piggyback:410] Out: 1145 [Urine:1145]  Physical Examination: General:  Chronically ill appearing white female Neuro:  Interactive, non focal HEENT:  Orally intubated, no JVD Cardiovascular:  rrr Lungs:  Scattered rhonchi, prolonged exp wheeze Abdomen:  Soft, non-tender Musculoskeletal: intact Skin:  intact  Ventilator settings: Vent Mode:  [-] PRVC FiO2 (%):  [30 %-100 %] 30.3 % Set Rate:  [10 bmp-16 bmp] 10 bmp Vt Set:  [470 mL-550 mL] 550 mL PEEP:  [4.3 cmH20-5 cmH20] 4.4 cmH20 Pressure Support:  [12 cmH20] 12 cmH20 Plateau Pressure:  [16 cmH20-25 cmH20] 16 cmH20  Labs and Imaging:  pCXR - no clear infx, haziness over lt lung ? Layering effusion  Lab 05/16/11 0430 05/15/11 1506 05/15/11 1249 05/15/11 1126  NA 138 -- 138 137  K 3.5 -- 4.4 4.7  CL 100 -- 96 96  CO2 27 -- -- 26  BUN 12 -- 10 9  CREATININE 0.56 0.62 0.80 --  GLUCOSE 133* -- 193* 187*    Lab 05/16/11 0430 05/15/11 1506 05/15/11 1249 05/15/11 1126  HGB 14.1 14.5 18.4* --  HCT 44.2 43.9 54.0* --  WBC 12.6* 17.9* -- 14.1*  PLT 174 207 -- 214    Assessment and Plan:  Acute-on-chronic respiratory failure in setting of CAP (community acquired pneumonia) and Acute exacerbation of chronic obstructive pulmonary disease (COPD): has significant autopeep when rr > 20  plan: -sedation protocol -SBTs, lower RR to 12 for auto-peep -scheduled BDs -IV solumedrol 60 q 8h  SIRS (systemic inflammatory response syndrome) with resultant Septic shock: autopeep likely contributing to  Her shock  Lab 05/16/11 0430 05/15/11 1506 05/15/11 1300 05/15/11 1126  PROCALCITON 0.66 0.16 -- --  WBC 12.6* 17.9* -- 14.1*  LATICACIDVEN -- -- 1.1 --  plan: -resolved, off pressors   CAD - pos trops Resume asa/plavix   Acute encephalopathy: multifactorial, mostly hypercarbia, but may be complicated by acute infection Plan: -resolved  Hyperglycemia Plan: -ssi  Best practices / Disposition: -->ICU status  under PCCM -->full code -->Heparin for DVT Px -->Protonix for GI Px -->ventilator bundle -->diet: NPO -->family updated at bedside  The patient is critically ill with multiple organ systems failure and requires high complexity decision making for assessment and support, frequent evaluation and titration of therapies, application of advanced monitoring technologies and extensive interpretation of multiple databases. Critical Care Time devoted to patient care services described in this note is 40 minutes.  [name] Leeson V. 05/16/2011, 11:55 AM

## 2011-05-17 ENCOUNTER — Inpatient Hospital Stay (HOSPITAL_COMMUNITY): Payer: Medicare Other

## 2011-05-17 ENCOUNTER — Other Ambulatory Visit: Payer: Self-pay

## 2011-05-17 LAB — GLUCOSE, CAPILLARY: Glucose-Capillary: 100 mg/dL — ABNORMAL HIGH (ref 70–99)

## 2011-05-17 LAB — CBC
Hemoglobin: 13.7 g/dL (ref 12.0–15.0)
MCH: 32.1 pg (ref 26.0–34.0)
MCHC: 32.3 g/dL (ref 30.0–36.0)
Platelets: 141 10*3/uL — ABNORMAL LOW (ref 150–400)
RBC: 4.27 MIL/uL (ref 3.87–5.11)

## 2011-05-17 LAB — BASIC METABOLIC PANEL
Calcium: 8.8 mg/dL (ref 8.4–10.5)
GFR calc Af Amer: >90 mL/min (ref >90)
GFR calc non Af Amer: >90 mL/min (ref >90)
Potassium: 4.3 mEq/L (ref 3.5–5.1)
Sodium: 141 mEq/L (ref 135–145)

## 2011-05-17 LAB — POCT I-STAT 3, ART BLOOD GAS (G3+)
Acid-Base Excess: 10 mmol/L — ABNORMAL HIGH (ref 0.0–2.0)
Bicarbonate: 36.7 mEq/L — ABNORMAL HIGH (ref 20.0–24.0)
pCO2 arterial: 57.4 mmHg (ref 35.0–45.0)
pO2, Arterial: 74 mmHg — ABNORMAL LOW (ref 80.0–100.0)

## 2011-05-17 LAB — CARDIAC PANEL(CRET KIN+CKTOT+MB+TROPI)
CK, MB: 7.7 ng/mL (ref 0.3–4.0)
Relative Index: 2.8 — ABNORMAL HIGH (ref 0.0–2.5)
Total CK: 273 U/L — ABNORMAL HIGH (ref 7–177)

## 2011-05-17 MED ORDER — ALBUTEROL SULFATE (5 MG/ML) 0.5% IN NEBU
2.5000 mg | INHALATION_SOLUTION | Freq: Four times a day (QID) | RESPIRATORY_TRACT | Status: DC
  Administered 2011-05-17 – 2011-05-18 (×3): 2.5 mg via RESPIRATORY_TRACT
  Filled 2011-05-17 (×3): qty 0.5

## 2011-05-17 MED ORDER — SODIUM CHLORIDE 0.9 % IV SOLN
0.2000 ug/kg/h | INTRAVENOUS | Status: DC
  Filled 2011-05-17: qty 2

## 2011-05-17 MED ORDER — ALPRAZOLAM 0.25 MG PO TABS
0.2500 mg | ORAL_TABLET | Freq: Once | ORAL | Status: AC
  Administered 2011-05-17: 0.25 mg via ORAL
  Filled 2011-05-17: qty 1

## 2011-05-17 MED ORDER — MIDAZOLAM HCL 2 MG/2ML IJ SOLN
2.0000 mg | INTRAMUSCULAR | Status: DC | PRN
  Administered 2011-05-18: 2 mg via INTRAVENOUS

## 2011-05-17 MED ORDER — ALBUTEROL SULFATE (5 MG/ML) 0.5% IN NEBU
INHALATION_SOLUTION | RESPIRATORY_TRACT | Status: AC
  Filled 2011-05-17: qty 0.5

## 2011-05-17 MED ORDER — IPRATROPIUM BROMIDE 0.02 % IN SOLN
0.5000 mg | Freq: Four times a day (QID) | RESPIRATORY_TRACT | Status: DC
  Administered 2011-05-17 – 2011-05-18 (×3): 0.5 mg via RESPIRATORY_TRACT
  Filled 2011-05-17 (×3): qty 2.5

## 2011-05-17 MED ORDER — FUROSEMIDE 10 MG/ML IJ SOLN
40.0000 mg | Freq: Once | INTRAMUSCULAR | Status: AC
  Administered 2011-05-17: 40 mg via INTRAVENOUS
  Filled 2011-05-17: qty 4

## 2011-05-17 MED ORDER — POTASSIUM CHLORIDE 10 MEQ/100ML IV SOLN
10.0000 meq | INTRAVENOUS | Status: AC
  Administered 2011-05-17 (×4): 10 meq via INTRAVENOUS
  Filled 2011-05-17 (×4): qty 100

## 2011-05-17 MED ORDER — IPRATROPIUM BROMIDE 0.02 % IN SOLN
RESPIRATORY_TRACT | Status: AC
  Filled 2011-05-17: qty 2.5

## 2011-05-17 MED ORDER — SODIUM CHLORIDE 0.9 % IV SOLN
0.2000 ug/kg/h | INTRAVENOUS | Status: DC
  Administered 2011-05-17: 0.2 ug/kg/h via INTRAVENOUS
  Filled 2011-05-17 (×2): qty 2

## 2011-05-17 MED ORDER — FENTANYL CITRATE 0.05 MG/ML IJ SOLN
25.0000 ug | INTRAMUSCULAR | Status: DC | PRN
Start: 2011-05-17 — End: 2011-05-18
  Administered 2011-05-18 (×2): 50 ug via INTRAVENOUS
  Filled 2011-05-17 (×3): qty 2

## 2011-05-17 MED ORDER — LORAZEPAM 2 MG/ML IJ SOLN
1.0000 mg | Freq: Once | INTRAMUSCULAR | Status: AC
  Administered 2011-05-17: 1 mg via INTRAVENOUS
  Filled 2011-05-17: qty 1

## 2011-05-17 MED ORDER — METOPROLOL TARTRATE 1 MG/ML IV SOLN: 2.5000 mg | Freq: Four times a day (QID) | INTRAVENOUS | Status: DC

## 2011-05-17 NOTE — Significant Event (Signed)
CRITICAL VALUE ALERT  Critical value received:  CKMB 7.7  Date of notification:  05/17/2011  Time of notification:  0530  Critical value read back:yes  Nurse who received alert:  Lamount Cranker, RN  MD notified (1st page):  Dr Delton Coombes  Time of first page:  0530  MD notified (2nd page):  Time of second page:  Responding MD:  Dr Delton Coombes  Time MD responded:  0530

## 2011-05-17 NOTE — Progress Notes (Signed)
ANTIBIOTIC CONSULT NOTE - INITIAL  Pharmacy Consult for Rocephin + Azithromycin Indication: Empiric CAP and sepsis coverage  No Known Allergies  Patient Measurements: Height: 5\' 9"  (175.3 cm) Weight: 169 lb 5 oz (76.8 kg) IBW/kg (Calculated) : 66.2   Vital Signs: Temp: 97.9 F (36.6 C) (03/22 0805) Temp src: Axillary (03/22 0805) BP: 136/65 mmHg (03/22 0900) Pulse Rate: 83  (03/22 0900) Intake/Output from previous day: 03/21 0701 - 03/22 0700 In: 2780 [I.V.:2400; NG/GT:20; IV Piggyback:360] Out: 685 [Urine:685] Intake/Output from this shift: Total I/O In: 200 [I.V.:200] Out: -   Labs:  Basename 05/17/11 0444 05/16/11 0430 05/15/11 1506  WBC 11.4* 12.6* 17.9*  HGB 13.7 14.1 14.5  PLT 141* 174 207  LABCREA -- -- --  CREATININE 0.45* 0.56 0.62   Estimated Creatinine Clearance: 69.4 ml/min (by C-G formula based on Cr of 0.45). No results found for this basename: VANCOTROUGH:2,VANCOPEAK:2,VANCORANDOM:2,GENTTROUGH:2,GENTPEAK:2,GENTRANDOM:2,TOBRATROUGH:2,TOBRAPEAK:2,TOBRARND:2,AMIKACINPEAK:2,AMIKACINTROU:2,AMIKACIN:2, in the last 72 hours   Microbiology: Recent Results (from the past 720 hour(s))  CULTURE, BLOOD (ROUTINE X 2)     Status: Normal (Preliminary result)   Collection Time   05/15/11  1:01 PM      Component Value Range Status Comment   Specimen Description BLOOD HAND LEFT   Final    Special Requests     Final    Value: BOTTLES DRAWN AEROBIC AND ANAEROBIC AERO 10CC,ANAE 6CC   Culture  Setup Time [id number]   Final    Culture     Final    Value:        BLOOD CULTURE RECEIVED NO GROWTH TO DATE CULTURE WILL BE HELD FOR 5 DAYS BEFORE ISSUING A FINAL NEGATIVE REPORT   Report Status PENDING   Incomplete   URINE CULTURE     Status: Normal   Collection Time   05/15/11  1:34 PM      Component Value Range Status Comment   Specimen Description URINE, CATHETERIZED   Final    Special Requests NONE   Final    Culture  Setup Time [id number]   Final    Colony Count NO  GROWTH   Final    Culture NO GROWTH   Final    Report Status 05/16/2011 FINAL   Final   MRSA PCR SCREENING     Status: Normal   Collection Time   05/15/11  2:45 PM      Component Value Range Status Comment   MRSA by PCR NEGATIVE  NEGATIVE  Final   CULTURE, BLOOD (ROUTINE X 2)     Status: Normal (Preliminary result)   Collection Time   05/15/11  3:10 PM      Component Value Range Status Comment   Specimen Description BLOOD NECK LEFT   Final    Special Requests     Final    Value: BOTTLES DRAWN AEROBIC AND ANAEROBIC 7CC LEFT IJ CATH   Culture  Setup Time [id number]   Final    Culture     Final    Value:        BLOOD CULTURE RECEIVED NO GROWTH TO DATE CULTURE WILL BE HELD FOR 5 DAYS BEFORE ISSUING A FINAL NEGATIVE REPORT   Report Status PENDING   Incomplete   CULTURE, RESPIRATORY     Status: Normal (Preliminary result)   Collection Time   05/15/11  4:33 PM      Component Value Range Status Comment   Specimen Description TRACHEAL ASPIRATE   Final    Special Requests NONE   Final  Gram Stain     Final    Value: FEW WBC PRESENT,BOTH PMN AND MONONUCLEAR     FEW SQUAMOUS EPITHELIAL CELLS PRESENT     RARE GRAM POSITIVE COCCI IN PAIRS   Culture Non-Pathogenic Oropharyngeal-type Flora Isolated.   Final    Report Status PENDING   Incomplete     Medical History: Past Medical History  Diagnosis Date  . Hypertension   . Hyperlipidemia   . Cerebrovascular disease, unspecified   . Unspecified glaucoma   . Obesity, unspecified   . COPD (chronic obstructive pulmonary disease)   . Coronary artery disease     cath 04/06/10: LAD occluded with R-L collats; med Rx WTC....probable V. Tach...med Rx  . Ventricular tachycardia   . SVT (supraventricular tachycardia)   . AAA (abdominal aortic aneurysm)     Medications:  Prescriptions prior to admission  Medication Sig Dispense Refill  . acetaminophen-codeine (TYLENOL #3) 300-30 MG per tablet Take 1 tablet by mouth 3 (three) times daily.         Marland Kitchen aspirin 81 MG EC tablet Take 81 mg by mouth daily.        . Aspirin-Acetaminophen 500-325 MG PACK Take by mouth. (Goody Body Pain) as needed       . atorvastatin (LIPITOR) 80 MG tablet Take 40 mg by mouth daily.       . budesonide-formoterol (SYMBICORT) 80-4.5 MCG/ACT inhaler Inhale 2 puffs into the lungs 2 (two) times daily.        . furosemide (LASIX) 20 MG tablet Take 1 tablet by mouth daily as needed. For swelling      . ipratropium-albuterol (DUONEB) 0.5-2.5 (3) MG/3ML SOLN Take 3 mLs by nebulization daily.        . metoprolol (TOPROL-XL) 50 MG 24 hr tablet TAKE 1/2 TABLET TWICE DAILY  30 tablet  12  . nitroGLYCERIN (NITROSTAT) 0.4 MG SL tablet Place 0.4 mg under the tongue every 5 (five) minutes as needed. Chest pain      . Oxygen-Helium 20-80 % KIT Inhale into the lungs. 2.5 Liters, use as needed with exertion       . PLAVIX 75 MG tablet Take 1 tablet by mouth Daily.      . potassium chloride SA (K-DUR,KLOR-CON) 20 MEQ tablet Take 20 mEq by mouth 2 (two) times daily.       . travoprost, benzalkonium, (TRAVATAN) 0.004 % ophthalmic solution Place 1 drop into both eyes at bedtime.         Scheduled:     . albuterol-ipratropium  6 puff Inhalation Q6H  . ALPRAZolam  0.25 mg Oral Once  . antiseptic oral rinse  15 mL Mouth Rinse QID  . aspirin  324 mg Oral Once  . aspirin  81 mg Per Tube Daily  . azithromycin  500 mg Intravenous Q24H  . cefTRIAXone (ROCEPHIN)  IV  1 g Intravenous Q24H  . chlorhexidine  15 mL Mouth Rinse BID  . clopidogrel  75 mg Per Tube Daily  . furosemide  40 mg Intravenous Once  . heparin  5,000 Units Subcutaneous Q8H  . insulin aspart  0-3 Units Subcutaneous Q4H  . magnesium sulfate 1 - 4 g bolus IVPB  2 g Intravenous Once  . methylPREDNISolone (SOLU-MEDROL) injection  60 mg Intravenous Q8H  . pantoprazole (PROTONIX) IV  40 mg Intravenous QHS  . potassium chloride  10 mEq Intravenous Q1 Hr x 4  . potassium chloride  40 mEq Per Tube Once  . potassium chloride       .  Travoprost (BAK Free)  1 drop Both Eyes QHS  . DISCONTD: clopidogrel  75 mg Oral Daily   Assessment: 71 y.o. F on Rocephin and Azithromycin  Day #3 empiric for CAP vs. COPD exacerbation.  Anticoagulation: Hep SQ for VTE px  Infectious Disease: Azithro/Rocephin d3 (3/20 >> current) for CAP and empiric sepsis coverage. Pt received Zosyn in the ED. Temp: 100.2, WBC down, SCr 0.45 (BUN up) CrCl~60-70 ml/min. PCT 0.4-trending down, CXR good Cultures: Sputum-NPF, BCx-ngtd, UCx-neg  Cardiovascular:Hx HTN/DL/CAD/AAA. Off pressors, BP some elevated this am-monitor (anxiety?), HR 80-90, NSR, On ASA + Plavix. + Troponin; (PTA Toprol not yet -important to consider if BP/HR remain an issue)   Endocrinology: No hx DM. CBG~ok. On ICU SSI- solumedrol 60 q8h-no change today (discontinued solucortef)   Gastrointestinal / Nutrition: OG tube, NPO-f/u nutrition, PPI IV-f/u transition to per tube vs oral if extubated.   Neurology: Hx cerebrovascular disease (unspecified). Sedated on intermittent Fent (50-100)/Versed (2-4) prn; Going to trial Precedex as having trouble weaning 2/2 to anxiety  Nephrology: SCr 0.45, CrCl~60-70 ml/min. K improved, no repeat Mag. *Note that patient was on KCl BID PTA. -Net+: Lasix today.   Pulmonary: VDRF (COPD-oxygen dependent PTA), intubated on 3/20. PRVC, FiO2 30% -improving, trial wean today-HR up(anxiety?); advancing tube 3cm as well  Hematology / Oncology: Hgb/Hct/Plt ok  PTA Medication Issues: lipitor, Symbicort, lasix prn, Duoneb, Toprol-XL, SL nitro  Best Practices: Hep SQ for VTE px, Prot for SUP (vent)   Goal of Therapy:  Eradication of infection  Plan:  1. Continue Rocephin 1g IV every 24 hours 2. Continue Azithromycin 500 mg IV every 24 hours 3. Will continue to follow renal function, culture results, LOT, and antibiotic de-escalation plans.   Link Snuffer, PharmD, BCPS Clinical Pharmacist [phone] 05/17/2011 9:56 AM

## 2011-05-17 NOTE — Progress Notes (Signed)
Name: [name] [name] MRN: [id number] DOB: [birth year]    LOS: 2  [facility]  History of Present Illness:   70 YOWF COPD (oxygen dependant 2.5 liters), presented via EMS on 3/20 w/ CC:  not feeling well for 2-3 days was ambulatory upon EMS arrival, but patient's condition deteriorated in route. Upon arrival to the ED, she was nonverbal, and leaning forward, with expiratory wheezing. Patient was placed on BiPAP immediately. No reported fevers. No chest pains. Failed attempt at NIPPV and therefore intubated. She was given Solu-Medrol and multiple breathing treatments in route. Required pressors transiently post intubation  Lines / Drains: OETT 3/20>>> Left IJ CVL 3/20>>>  Cultures: Sputum 3/20>>> BCX23/20>>> UC 3/20>>> PCT 3/20>>>  Antibiotics: Rocephin 3/20>>> azithro 3/20>>>  Tests / Events: Echo 3/20>>>    Past Medical History  Diagnosis Date  . Hypertension   . Hyperlipidemia   . Cerebrovascular disease, unspecified   . Unspecified glaucoma   . Obesity, unspecified   . COPD (chronic obstructive pulmonary disease)   . Coronary artery disease     cath 04/06/10: LAD occluded with R-L collats; med Rx WTC....probable V. Tach...med Rx  . Ventricular tachycardia   . SVT (supraventricular tachycardia)   . AAA (abdominal aortic aneurysm)    Denies CP, breathing better Vital Signs: Temp:  [97.8 F (36.6 C)-98.5 F (36.9 C)] 97.9 F (36.6 C) (03/22 0805) Pulse Rate:  [63-106] 83  (03/22 0900) Resp:  [10-18] 12  (03/22 0900) BP: (92-150)/(40-103) 136/65 mmHg (03/22 0900) SpO2:  [92 %-99 %] 99 % (03/22 0900) FiO2 (%):  [30 %-30.4 %] 30.1 % (03/22 0901) Weight:  [76.8 kg (169 lb 5 oz)] 76.8 kg (169 lb 5 oz) (03/22 0500) CVP:  [13 mmHg] 13 mmHg    . sodium chloride 100 mL/hr at 05/16/11 2207  . dextrose     I/O last 3 completed shifts: In: 4142.9 [I.V.:3752.9; NG/GT:20; IV Piggyback:370] Out: 1260 [Urine:1260]  Physical Examination: Gen:  comfortable on vent, not anxious HEENT: NCAT, PERRL, ETT in place PULM: slow expiratory phase, no wheeze CV: RRR, no mgr, no JVD AB: BS+, soft, nontender Ext: trace edema Neuro: Awake and alert, following commands, nods head to questions, maew  Ventilator settings: Vent Mode:  [-] PRVC FiO2 (%):  [30 %-30.4 %] 30.1 % Set Rate:  [10 bmp] 10 bmp Vt Set:  [550 mL] 550 mL PEEP:  [4.1 cmH20-5 cmH20] 4.1 cmH20 Pressure Support:  [20 cmH20] 20 cmH20 Plateau Pressure:  [18 cmH20-22 cmH20] 18 cmH20  Labs and Imaging:  pCXR - no clear infx, haziness over lt lung ? Layering effusion  Lab 05/17/11 0444 05/16/11 0430 05/15/11 1506 05/15/11 1249 05/15/11 1126  NA 141 138 -- 138 --  K 4.3 3.5 -- 4.4 --  CL 105 100 -- 96 --  CO2 30 27 -- -- 26  BUN 14 12 -- 10 --  CREATININE 0.45* 0.56 0.62 -- --  GLUCOSE 121* 133* -- 193* --    Lab 05/17/11 0444 05/16/11 0430 05/15/11 1506  HGB 13.7 14.1 14.5  HCT 42.4 44.2 43.9  WBC 11.4* 12.6* 17.9*  PLT 141* 174 207    Assessment and Plan:  Acute-on-chronic respiratory failure in setting of possible CAP (minimal infiltrate) and Acute exacerbation of chronic obstructive pulmonary disease (COPD):   plan: -extubate today, limited by severe anxiety -scheduled BDs -IV solumedrol 60 q 8h, wean 3/23   SIRS (systemic inflammatory response syndrome) with resultant Septic shock: autopeep likely contributing to her shock  Lab 05/17/11 0444 05/16/11 0430 05/15/11 1506 05/15/11 1300 05/15/11 1126  PROCALCITON 0.40 0.66 0.16 -- --  WBC 11.4* 12.6* 17.9* -- 14.1*  LATICACIDVEN -- -- -- 1.1 --  plan: -resolved, off pressors   CAD - pos trops -Resume asa/plavix -TTE pending -Lasix today to keep net even   Acute encephalopathy: multifactorial, mostly hypercarbia, but may be complicated by acute infection Plan: -resolved  Hyperglycemia Plan: -ssi  Best practices / Disposition: -->ICU status under PCCM -->full code -->Heparin for DVT  Px -->Protonix for GI Px -->ventilator bundle -->diet: NPO -->family updated at bedside  The patient is critically ill with multiple organ systems failure and requires high complexity decision making for assessment and support, frequent evaluation and titration of therapies, application of advanced monitoring technologies and extensive interpretation of multiple databases. Critical Care Time devoted to patient care services described in this note is 30 minutes.  Oluwakemi Salsberry 05/17/2011, 9:32 AM

## 2011-05-17 NOTE — Progress Notes (Signed)
eLink Physician-Brief Progress Note Patient Name: [name] [name] DOB: 03/31/1940 MRN: [id number]  Date of Service  05/17/2011   HPI/Events of Note   Bradycardic on precedex and BB IV   eICU Interventions  D/c precedex and metoprolol and monitor same   Intervention Category Major Interventions: Arrhythmia - evaluation and management  Shan Levans 05/17/2011, 6:04 PM

## 2011-05-17 NOTE — Progress Notes (Signed)
25 mcg fentanyl given for pain relief

## 2011-05-17 NOTE — Progress Notes (Signed)
eLink Physician-Brief Progress Note Patient Name: [name] [name] DOB: [birth year] MRN: [id number]  Date of Service  05/17/2011   HPI/Events of Note   Called for ongoing agitation  eICU Interventions  Try one dose ativan 1mg  IVP, may need vent /intubation again   Intervention Category Major Interventions: Change in mental status - evaluation and management  Shan Levans 05/17/2011, 8:09 PM

## 2011-05-17 NOTE — Progress Notes (Signed)
Pt placed back on full support due to decreased Vt's and rr.

## 2011-05-17 NOTE — Procedures (Signed)
Extubation Procedure Note  Patient Details:   Name: [name] [name] DOB: 15-Aug-1940 MRN: [id number]   Airway Documentation:  Airway 7.5 mm (Active)  Secured at (cm) 21 cm 05/17/2011 12:23 PM  Measured From Lips 05/17/2011 12:23 PM  Secured Location Center 05/17/2011 12:23 PM  Secured By [employer] 05/17/2011 12:23 PM  Tube Holder Repositioned Yes 05/17/2011 12:23 PM  Cuff Pressure (cm H2O) 24 cm H2O 05/17/2011  9:01 AM  Site Condition Dry 05/17/2011 12:23 PM    Evaluation  O2 sats: stable throughout Complications: No apparent complications Patient did tolerate procedure well. Bilateral Breath Sounds: Diminished Suctioning: Oral;Airway Yes No Stridor noted @ this time  Fara Olden 05/17/2011, 2:32 PM

## 2011-05-18 ENCOUNTER — Inpatient Hospital Stay (HOSPITAL_COMMUNITY): Payer: Medicare Other

## 2011-05-18 ENCOUNTER — Encounter (HOSPITAL_COMMUNITY): Payer: Self-pay | Admitting: *Deleted

## 2011-05-18 DIAGNOSIS — J9620 Acute and chronic respiratory failure, unspecified whether with hypoxia or hypercapnia: Secondary | ICD-10-CM

## 2011-05-18 DIAGNOSIS — J449 Chronic obstructive pulmonary disease, unspecified: Secondary | ICD-10-CM

## 2011-05-18 DIAGNOSIS — J4489 Other specified chronic obstructive pulmonary disease: Secondary | ICD-10-CM

## 2011-05-18 LAB — CBC
Hemoglobin: 13.6 g/dL (ref 12.0–15.0)
Platelets: 139 10*3/uL — ABNORMAL LOW (ref 150–400)
RBC: 4.24 MIL/uL (ref 3.87–5.11)
WBC: 9.6 10*3/uL (ref 4.0–10.5)

## 2011-05-18 LAB — GLUCOSE, CAPILLARY
Glucose-Capillary: 107 mg/dL — ABNORMAL HIGH (ref 70–99)
Glucose-Capillary: 116 mg/dL — ABNORMAL HIGH (ref 70–99)
Glucose-Capillary: 123 mg/dL — ABNORMAL HIGH (ref 70–99)
Glucose-Capillary: 143 mg/dL — ABNORMAL HIGH (ref 70–99)
Glucose-Capillary: 112 mg/dL — ABNORMAL HIGH (ref 70–99)

## 2011-05-18 LAB — POCT I-STAT 3, ART BLOOD GAS (G3+)
Acid-Base Excess: 7 mmol/L — ABNORMAL HIGH (ref 0.0–2.0)
Acid-Base Excess: 7 mmol/L — ABNORMAL HIGH (ref 0.0–2.0)
Bicarbonate: 35.2 mEq/L — ABNORMAL HIGH (ref 20.0–24.0)
O2 Saturation: 85 %
Patient temperature: 37
TCO2: 38 mmol/L (ref 0–100)
pH, Arterial: 7.315 — ABNORMAL LOW (ref 7.350–7.400)
pO2, Arterial: 56 mmHg — ABNORMAL LOW (ref 80.0–100.0)

## 2011-05-18 LAB — BASIC METABOLIC PANEL
CO2: 35 mEq/L — ABNORMAL HIGH (ref 19–32)
Calcium: 8.9 mg/dL (ref 8.4–10.5)
Chloride: 101 mEq/L (ref 96–112)
Glucose, Bld: 117 mg/dL — ABNORMAL HIGH (ref 70–99)
Potassium: 4.2 mEq/L (ref 3.5–5.1)
Sodium: 142 mEq/L (ref 135–145)

## 2011-05-18 LAB — CULTURE, RESPIRATORY W GRAM STAIN

## 2011-05-18 MED ORDER — ETOMIDATE 2 MG/ML IV SOLN
20.0000 mg | Freq: Once | INTRAVENOUS | Status: AC
  Administered 2011-05-18: 20 mg via INTRAVENOUS

## 2011-05-18 MED ORDER — FENTANYL CITRATE 0.05 MG/ML IJ SOLN
25.0000 ug | INTRAMUSCULAR | Status: DC | PRN
  Administered 2011-05-18 – 2011-05-24 (×5): 50 ug via INTRAVENOUS
  Filled 2011-05-18 (×5): qty 2

## 2011-05-18 MED ORDER — ONDANSETRON HCL 4 MG/2ML IJ SOLN
INTRAMUSCULAR | Status: AC
  Filled 2011-05-18: qty 2

## 2011-05-18 MED ORDER — ONDANSETRON HCL 4 MG/2ML IJ SOLN
4.0000 mg | Freq: Four times a day (QID) | INTRAMUSCULAR | Status: DC | PRN
  Administered 2011-05-18 – 2011-05-19 (×2): 4 mg via INTRAVENOUS
  Filled 2011-05-18: qty 2

## 2011-05-18 MED ORDER — METOPROLOL TARTRATE 1 MG/ML IV SOLN
INTRAVENOUS | Status: AC
  Administered 2011-05-18: 2.5 mg
  Filled 2011-05-18: qty 5

## 2011-05-18 MED ORDER — MIDAZOLAM HCL 2 MG/2ML IJ SOLN
INTRAMUSCULAR | Status: AC
  Administered 2011-05-18: 2 mg via INTRAVENOUS
  Filled 2011-05-18: qty 4

## 2011-05-18 MED ORDER — FENTANYL CITRATE 0.05 MG/ML IJ SOLN
INTRAMUSCULAR | Status: AC
  Administered 2011-05-18: 50 ug
  Filled 2011-05-18: qty 4

## 2011-05-18 MED ORDER — PNEUMOCOCCAL VAC POLYVALENT 25 MCG/0.5ML IJ INJ
0.5000 mL | INJECTION | INTRAMUSCULAR | Status: AC
  Administered 2011-05-19: 0.5 mL via INTRAMUSCULAR
  Filled 2011-05-18: qty 0.5

## 2011-05-18 MED ORDER — LORAZEPAM 2 MG/ML IJ SOLN
0.5000 mg | INTRAMUSCULAR | Status: DC | PRN
  Administered 2011-05-18: 1 mg via INTRAVENOUS
  Administered 2011-05-18: 0.5 mg via INTRAVENOUS
  Administered 2011-05-18 – 2011-05-20 (×2): 1 mg via INTRAVENOUS
  Administered 2011-05-25 – 2011-05-26 (×2): 0.5 mg via INTRAVENOUS
  Administered 2011-05-27: 1 mg via INTRAVENOUS
  Administered 2011-05-27: 0.5 mg via INTRAVENOUS
  Filled 2011-05-18 (×8): qty 1

## 2011-05-18 MED ORDER — METHYLPREDNISOLONE SODIUM SUCC 125 MG IJ SOLR
60.0000 mg | Freq: Three times a day (TID) | INTRAMUSCULAR | Status: DC
  Administered 2011-05-18 – 2011-05-21 (×9): 60 mg via INTRAVENOUS
  Filled 2011-05-18 (×9): qty 0.96

## 2011-05-18 MED ORDER — CLONAZEPAM 0.5 MG PO TABS
0.5000 mg | ORAL_TABLET | Freq: Three times a day (TID) | ORAL | Status: DC
  Administered 2011-05-18 – 2011-05-28 (×29): 0.5 mg via ORAL
  Filled 2011-05-18 (×29): qty 1

## 2011-05-18 MED ORDER — LEVALBUTEROL HCL 0.63 MG/3ML IN NEBU
0.6300 mg | INHALATION_SOLUTION | Freq: Four times a day (QID) | RESPIRATORY_TRACT | Status: DC
  Administered 2011-05-18: 0.63 mg via RESPIRATORY_TRACT
  Filled 2011-05-18 (×3): qty 3

## 2011-05-18 MED ORDER — MIDAZOLAM HCL 2 MG/2ML IJ SOLN
1.0000 mg | INTRAMUSCULAR | Status: DC | PRN
  Administered 2011-05-18 – 2011-05-20 (×8): 2 mg via INTRAVENOUS
  Filled 2011-05-18 (×8): qty 2

## 2011-05-18 MED ORDER — IPRATROPIUM BROMIDE HFA 17 MCG/ACT IN AERS
4.0000 | INHALATION_SPRAY | Freq: Four times a day (QID) | RESPIRATORY_TRACT | Status: DC
  Administered 2011-05-18 – 2011-05-22 (×15): 4 via RESPIRATORY_TRACT
  Filled 2011-05-18 (×2): qty 12.9

## 2011-05-18 MED ORDER — METHYLPREDNISOLONE SODIUM SUCC 125 MG IJ SOLR: 60.0000 mg | Freq: Two times a day (BID) | INTRAMUSCULAR | Status: DC

## 2011-05-18 MED ORDER — LEVALBUTEROL TARTRATE 45 MCG/ACT IN AERO
4.0000 | INHALATION_SPRAY | Freq: Four times a day (QID) | RESPIRATORY_TRACT | Status: DC
  Administered 2011-05-18 – 2011-05-22 (×15): 4 via RESPIRATORY_TRACT
  Filled 2011-05-18: qty 15

## 2011-05-18 MED ORDER — METOPROLOL TARTRATE 1 MG/ML IV SOLN
5.0000 mg | Freq: Four times a day (QID) | INTRAVENOUS | Status: DC
  Administered 2011-05-18 – 2011-05-20 (×7): 5 mg via INTRAVENOUS
  Filled 2011-05-18 (×15): qty 5

## 2011-05-18 NOTE — Procedures (Signed)
Intubation Procedure Note [name] [name] [id number] [birth year]  Procedure: Intubation Indications: Respiratory insufficiency  Procedure Details Consent: Risks of procedure as well as the alternatives and risks of each were explained to the (patient/caregiver).  Consent for procedure obtained. Time Out: Verified patient identification, verified procedure, site/side was marked, verified correct patient position, special equipment/implants available, medications/allergies/relevent history reviewed, required imaging and test results available.  Performed  Maximum sterile technique was used including gloves and mask.  MAC and 3    Evaluation Hemodynamic Status: BP stable throughout; O2 sats: transiently fell during during procedure Patient's Current Condition: stable Complications: No apparent complications Patient did tolerate procedure well. Chest X-ray ordered to verify placement.  CXR: pending.   Fara Olden 05/18/2011

## 2011-05-18 NOTE — Progress Notes (Addendum)
Name: [name] [name] MRN: [id number] DOB: 1940/04/05    LOS: 3  [facility]  History of Present Illness:   13 YOWF COPD (oxygen dependant 2.5 liters), presented via EMS on 3/20 w/ CC:  not feeling well for 2-3 days was ambulatory upon EMS arrival, but patient's condition deteriorated in route. Upon arrival to the ED, she was nonverbal, and leaning forward, with expiratory wheezing. Patient was placed on BiPAP immediately. No reported fevers. No chest pains. Failed attempt at NIPPV and therefore intubated. She was given Solu-Medrol and multiple breathing treatments in route. Required pressors transiently post intubation.    Lines / Drains: OETT 3/20>>>3/22 Left IJ CVL 3/20>>>  Cultures: Sputum 3/20>>> BCx2 3/20>>> UC 3/20>>> PCT 3/20>>> MRSA PCR>>>neg  Antibiotics: Rocephin 3/20>>> azithro 3/20>>>  Tests / Events: Echo 3/20>>>    SUBJECTIVE:  RN reports patient continues to have episodes of significant anxiety.  Remained on bipap overnight. HR increased to 130's   Past Medical History  Diagnosis Date  . Hypertension   . Hyperlipidemia   . Cerebrovascular disease, unspecified   . Unspecified glaucoma   . Obesity, unspecified   . COPD (chronic obstructive pulmonary disease)   . Coronary artery disease     cath 04/06/10: LAD occluded with R-L collats; med Rx WTC....probable V. Tach...med Rx  . Ventricular tachycardia   . SVT (supraventricular tachycardia)   . AAA (abdominal aortic aneurysm)    Denies CP, breathing better Vital Signs:  Filed Vitals:   05/18/11 0600 05/18/11 0700 05/18/11 0800 05/18/11 0834  BP: 130/60 122/64 135/79   Pulse: 79 86 122   Temp:   97.1 F (36.2 C)   TempSrc:   Oral   Resp: 10 9 23    Height:      Weight:      SpO2: 100% 100% 87% 98%     I/O last 3 completed shifts: In: 3465.4 [I.V.:2839.4; IV Piggyback:626] Out: 3925 [Urine:3925]  Physical Examination: Gen: comfortable on bipap, not anxious HEENT: NCAT,  PERRL, ETT in place PULM: shallow respirations, lungs bilaterally diminished CV: RRR, no mgr, no JVD AB: BS+, soft, nontender Ext: trace edema Neuro: Awake and alert, following commands, nods head to questions, maew  Ventilator settings: Vent Mode:  [-] BIPAP FiO2 (%):  [30 %-100 %] 40 % Set Rate:  [10 bmp] 10 bmp Vt Set:  [550 mL] 550 mL PEEP:  [4.5 cmH20-5 cmH20] 5 cmH20 Plateau Pressure:  [18 cmH20] 18 cmH20  Labs and Imaging:  pCXR - no clear infx, haziness over lt lung ? Layering effusion  Lab 05/18/11 0500 05/17/11 0444 05/16/11 0430  NA 142 141 138  K 4.2 4.3 3.5  CL 101 105 100  CO2 35* 30 27  BUN 18 14 12   CREATININE 0.44* 0.45* 0.56  GLUCOSE 117* 121* 133*    Lab 05/18/11 0500 05/17/11 0444 05/16/11 0430  HGB 13.6 13.7 14.1  HCT 41.2 42.4 44.2  WBC 9.6 11.4* 12.6*  PLT 139* 141* 174    Assessment and Plan:  Acute-on-chronic respiratory failure in setting of possible CAP (minimal infiltrate) and Acute exacerbation of chronic obstructive pulmonary disease (COPD):  plan: -PRN bipap, anxiety appears to be major issue (responded well to ativan) -scheduled BDs -IV solumedrol 60 q 8h, wean 3/23-->60 Q12 -Attempt trial off bipap -concerned she is very high risk for intubation   SIRS (systemic inflammatory response syndrome) with resultant Septic shock:   Lab 05/18/11 0500 05/17/11 0444 05/16/11 0430 05/15/11 1506 05/15/11 1300  PROCALCITON -- 0.40 0.66 0.16 --  WBC 9.6 11.4* 12.6* 17.9* --  LATICACIDVEN -- -- -- -- 1.1  plan: -RESOLVED   CAD - pos trops -Resume asa/plavix -TTE pending   Acute encephalopathy: multifactorial, mostly hypercarbia, but may be complicated by acute infection Plan: -monitor, supportive care  Hyperglycemia Plan: -ssi  Anxiety Assessment: significant factor in weaning Plan: -schedule clonazepam 0.5 tid -PRN ativan   Husband updated at bedside.  Patient may require re-intubation for airway support / protection.  Has  been on bipap overnight and continuous this am.     Best practices / Disposition: -->ICU status under PCCM -->full code -->Heparin for DVT Px -->Protonix for GI Px -->ventilator bundle -->diet: NPO -->family updated at bedside    [name] Brim, NP-C [facility] Pgr: [phone]  Attending:   I have seen and examined the patient with nurse practitioner/resident and agree with the note above.   [name] [name] has been on BIPAP essentially since coming off the vent yesterday.  While on BIPAP she is incredibly comfortable with good air movement.  However she has failed attempts to remove it, in part due to severe anxiety and recurrent SVT. The SVT is controlled, and the anxiety appears improved with benzodiazepines.  I explained to her husband that we will try to stop BIPAP again today (after giving benzodiazepines) but if she fails then she needs to go back on the ventilator so she can get the nutrition and other support she needs.  CC time: 30 minutes.  [name] [name] PCCM Pager: [phone] If no response, call [phone]

## 2011-05-18 NOTE — Progress Notes (Signed)
Increased fio2 post abg

## 2011-05-18 NOTE — Procedures (Signed)
Intubation Procedure Note [name] [name] [id number] May 04, 1940  Procedure: Intubation Indications: Respiratory insufficiency  Procedure Details Consent: Risks of procedure as well as the alternatives and risks of each were explained to the (patient/caregiver).  Consent for procedure obtained. Time Out: Verified patient identification, verified procedure, site/side was marked, verified correct patient position, special equipment/implants available, medications/allergies/relevent history reviewed, required imaging and test results available.  Performed  Drugs: Etomidate 20mg  IV Miller and 3 was used for DLx2 (one per RT, second per PCCM attending); PCCM attending had grade 1 view, 7.5 ETT passed easily through beefy red, swollen cords under direct visualiztion   Evaluation Hemodynamic Status: BP stable throughout; O2 sats: transiently fell during during procedure Patient's Current Condition: stable Complications: No apparent complications Patient did tolerate procedure well. Chest X-ray ordered to verify placement.  CXR: pending.   [name] [name] 05/18/2011

## 2011-05-19 ENCOUNTER — Inpatient Hospital Stay (HOSPITAL_COMMUNITY): Payer: Medicare Other

## 2011-05-19 ENCOUNTER — Encounter (HOSPITAL_COMMUNITY): Payer: Self-pay | Admitting: *Deleted

## 2011-05-19 DIAGNOSIS — E46 Unspecified protein-calorie malnutrition: Secondary | ICD-10-CM

## 2011-05-19 DIAGNOSIS — J189 Pneumonia, unspecified organism: Secondary | ICD-10-CM

## 2011-05-19 LAB — BASIC METABOLIC PANEL
BUN: 22 mg/dL (ref 6–23)
Creatinine, Ser: 0.45 mg/dL — ABNORMAL LOW (ref 0.50–1.10)
GFR calc Af Amer: >90 mL/min (ref >90)
GFR calc non Af Amer: >90 mL/min (ref >90)
Glucose, Bld: 120 mg/dL — ABNORMAL HIGH (ref 70–99)

## 2011-05-19 LAB — GLUCOSE, CAPILLARY
Glucose-Capillary: 113 mg/dL — ABNORMAL HIGH (ref 70–99)
Glucose-Capillary: 117 mg/dL — ABNORMAL HIGH (ref 70–99)

## 2011-05-19 LAB — CBC
MCH: 32 pg (ref 26.0–34.0)
MCHC: 32.1 g/dL (ref 30.0–36.0)
RDW: 13.5 % (ref 11.5–15.5)

## 2011-05-19 MED ORDER — FUROSEMIDE 10 MG/ML IJ SOLN
INTRAMUSCULAR | Status: AC
  Filled 2011-05-19: qty 4

## 2011-05-19 MED ORDER — FUROSEMIDE 10 MG/ML IJ SOLN
40.0000 mg | Freq: Four times a day (QID) | INTRAMUSCULAR | Status: AC
  Administered 2011-05-19 (×2): 40 mg via INTRAVENOUS
  Filled 2011-05-19: qty 4

## 2011-05-19 NOTE — Progress Notes (Signed)
Name: [name] [name] MRN: [id number] DOB: 05-08-1940    LOS: 4  [place] [facility]  History of Present Illness:   32 YOWF COPD (oxygen dependant 2.5 liters), presented via EMS on 3/20 w/ CC:  not feeling well for 2-3 days was ambulatory upon EMS arrival, but patient's condition deteriorated in route. Upon arrival to the ED, she was nonverbal, and leaning forward, with expiratory wheezing. Patient was placed on BiPAP immediately. No reported fevers. No chest pains. Failed attempt at NIPPV and therefore intubated. She was given Solu-Medrol and multiple breathing treatments in route. Required pressors transiently post intubation.    Lines / Drains: OETT 3/20>>>3/22 Left IJ CVL 3/20>>>  Cultures: Sputum 3/20>>> BCx2 3/20>>> UC 3/20>>> PCT 3/20>>> MRSA PCR>>>neg  Antibiotics: Rocephin 3/20>>> azithro 3/20>>>  Tests / Events: Echo 3/20>>>    SUBJECTIVE:  Intubated 3/23 for ongoing resp distress on BIPAP.  Stable post intubation.   Past Medical History  Diagnosis Date  . Hypertension   . Hyperlipidemia   . Cerebrovascular disease, unspecified   . Unspecified glaucoma   . Obesity, unspecified   . COPD (chronic obstructive pulmonary disease)   . Coronary artery disease     cath 04/06/10: LAD occluded with R-L collats; med Rx WTC....probable V. Tach...med Rx  . Ventricular tachycardia   . SVT (supraventricular tachycardia)   . AAA (abdominal aortic aneurysm)    Denies CP, breathing better Vital Signs:  Filed Vitals:   05/19/11 1200 05/19/11 1205 05/19/11 1230 05/19/11 1300  BP: 138/65   126/83  Pulse: 78  92 80  Temp:  98.4 F (36.9 C)    TempSrc:  Oral    Resp: 11  15 14   Height:      Weight:      SpO2: 95%  95% 95%     I/O last 3 completed shifts: In: 4282 [I.V.:3800; NG/GT:170; IV Piggyback:312] Out: 2165 [Urine:2165]  Physical Examination: Gen: comfortable on vent HEENT: NCAT, PERRL, ETT in place PULM: shallow respirations, lungs bilaterally  diminished CV: RRR, no mgr, no JVD AB: BS+, soft, nontender Ext: trace edema Neuro: sedated on vent but has been following commands prior  Ventilator settings: Vent Mode:  [-] PRVC FiO2 (%):  [30.1 %-60.2 %] 40.4 % Set Rate:  [10 bmp] 10 bmp Vt Set:  [500 mL] 500 mL PEEP:  [4.3 cmH20-5 cmH20] 4.6 cmH20 Plateau Pressure:  [12 cmH20-21 cmH20] 19 cmH20  Labs and Imaging:  3/24 CXR: RLL infiltrate and effusion  Lab 05/19/11 0403 05/18/11 0500 05/17/11 0444  NA 141 142 141  K 4.0 4.2 4.3  CL 101 101 105  CO2 37* 35* 30  BUN 22 18 14   CREATININE 0.45* 0.44* 0.45*  GLUCOSE 120* 117* 121*    Lab 05/19/11 0403 05/18/11 0500 05/17/11 0444  HGB 13.1 13.6 13.7  HCT 40.8 41.2 42.4  WBC 11.8* 9.6 11.4*  PLT 149* 139* 141*    Assessment and Plan:  Acute-on-chronic respiratory failure in setting of possible CAP (minimal infiltrate) and Acute exacerbation of chronic obstructive pulmonary disease (COPD): 3/24 CXR worse, RLL infiltrate and R effusion; likely pulm edema and not worsening pneumonia given lack of fever and significant secretions  plan: -full vent support today -scheduled BDs -IV solumedrol 60 q 8h, wean 3/23-->60 Q12 -diurese today give CXR findings -if fever, change in secretions, or no improvement with diuresis consider broader antibiotics   SIRS (systemic inflammatory response syndrome) with resultant Septic shock:   Lab 05/19/11 0403 05/18/11 0500 05/17/11  1610 05/16/11 0430 05/15/11 1506 05/15/11 1300  PROCALCITON -- -- 0.40 0.66 0.16 --  WBC 11.8* 9.6 11.4* 12.6* -- --  LATICACIDVEN -- -- -- -- -- 1.1  plan: -RESOLVED   CAD - pos trops -Resume asa/plavix -TTE pending   Acute encephalopathy: multifactorial, mostly hypercarbia, but may be complicated by acute infection Plan: -monitor, supportive care  Hyperglycemia Plan: -ssi  Anxiety Assessment: significant factor in weaning Plan: -schedule clonazepam 0.5 tid -PRN ativan   Husband updated at  bedside at length by me   Best practices / Disposition: -->ICU status under PCCM -->full code -->Heparin for DVT Px -->Protonix for GI Px -->ventilator bundle -->diet: start tube feeding today -->family updated at bedside    CC time: 30 minutes.  Yolonda Kida PCCM Pager: [phone] If no response, call [phone]

## 2011-05-20 ENCOUNTER — Inpatient Hospital Stay (HOSPITAL_COMMUNITY): Payer: Medicare Other

## 2011-05-20 DIAGNOSIS — I714 Abdominal aortic aneurysm, without rupture, unspecified: Secondary | ICD-10-CM

## 2011-05-20 DIAGNOSIS — G9340 Encephalopathy, unspecified: Secondary | ICD-10-CM

## 2011-05-20 LAB — BLOOD GAS, ARTERIAL
Bicarbonate: 44.1 mEq/L — ABNORMAL HIGH (ref 20.0–24.0)
MECHVT: 500 mL
O2 Saturation: 95.6 %
Patient temperature: 98.8
TCO2: 46.3 mmol/L (ref 0–100)
pH, Arterial: 7.398 (ref 7.350–7.400)

## 2011-05-20 LAB — BASIC METABOLIC PANEL
BUN: 23 mg/dL (ref 6–23)
Calcium: 8.8 mg/dL (ref 8.4–10.5)
Creatinine, Ser: 0.51 mg/dL (ref 0.50–1.10)
GFR calc Af Amer: >90 mL/min (ref >90)
GFR calc non Af Amer: >90 mL/min (ref >90)
Glucose, Bld: 125 mg/dL — ABNORMAL HIGH (ref 70–99)
Potassium: 3.4 mEq/L — ABNORMAL LOW (ref 3.5–5.1)

## 2011-05-20 LAB — CBC
HCT: 41.8 % (ref 36.0–46.0)
Hemoglobin: 13.4 g/dL (ref 12.0–15.0)
MCH: 32.1 pg (ref 26.0–34.0)
MCHC: 32.1 g/dL (ref 30.0–36.0)
MCV: 100 fL (ref 78.0–100.0)
RDW: 13.2 % (ref 11.5–15.5)

## 2011-05-20 LAB — GLUCOSE, CAPILLARY
Glucose-Capillary: 127 mg/dL — ABNORMAL HIGH (ref 70–99)
Glucose-Capillary: 116 mg/dL — ABNORMAL HIGH (ref 70–99)
Glucose-Capillary: 119 mg/dL — ABNORMAL HIGH (ref 70–99)
Glucose-Capillary: 117 mg/dL — ABNORMAL HIGH (ref 70–99)

## 2011-05-20 MED ORDER — AMIODARONE LOAD VIA INFUSION
150.0000 mg | Freq: Once | INTRAVENOUS | Status: AC
  Administered 2011-05-20: 150 mg via INTRAVENOUS
  Filled 2011-05-20: qty 83.34

## 2011-05-20 MED ORDER — POTASSIUM CHLORIDE 20 MEQ/15ML (10%) PO LIQD
40.0000 meq | Freq: Once | ORAL | Status: AC
  Administered 2011-05-20: 40 meq
  Filled 2011-05-20: qty 30

## 2011-05-20 MED ORDER — ADENOSINE 6 MG/2ML IV SOLN
INTRAVENOUS | Status: AC
  Administered 2011-05-20: 12 mg
  Filled 2011-05-20: qty 2

## 2011-05-20 MED ORDER — PRO-STAT SUGAR FREE PO LIQD
30.0000 mL | Freq: Three times a day (TID) | ORAL | Status: DC
Start: 2011-05-20 — End: 2011-05-22
  Administered 2011-05-20 – 2011-05-22 (×6): 30 mL
  Filled 2011-05-20 (×9): qty 30

## 2011-05-20 MED ORDER — ADENOSINE 12 MG/4ML IV SOLN
12.0000 mg | Freq: Once | INTRAVENOUS | Status: AC
  Filled 2011-05-20: qty 4

## 2011-05-20 MED ORDER — AMIODARONE HCL IN DEXTROSE 360-4.14 MG/200ML-% IV SOLN
60.0000 mg/h | INTRAVENOUS | Status: AC
  Administered 2011-05-20 – 2011-05-21 (×2): 60 mg/h via INTRAVENOUS
  Filled 2011-05-20: qty 200

## 2011-05-20 MED ORDER — ADENOSINE 6 MG/2ML IV SOLN
INTRAVENOUS | Status: AC
  Administered 2011-05-20: 6 mg
  Filled 2011-05-20: qty 4

## 2011-05-20 MED ORDER — POTASSIUM CHLORIDE 20 MEQ/15ML (10%) PO LIQD
ORAL | Status: AC
  Filled 2011-05-20: qty 30

## 2011-05-20 MED ORDER — PROPOFOL 10 MG/ML IV EMUL
5.0000 ug/kg/min | INTRAVENOUS | Status: DC
  Administered 2011-05-20: 10 ug/kg/min via INTRAVENOUS
  Administered 2011-05-21: 30 ug/kg/min via INTRAVENOUS
  Administered 2011-05-21: 15 ug/kg/min via INTRAVENOUS
  Administered 2011-05-21: 20 ug/kg/min via INTRAVENOUS
  Filled 2011-05-20 (×5): qty 100

## 2011-05-20 MED ORDER — METOPROLOL TARTRATE 1 MG/ML IV SOLN
5.0000 mg | Freq: Once | INTRAVENOUS | Status: AC
  Administered 2011-05-20: 5 mg via INTRAVENOUS

## 2011-05-20 MED ORDER — FUROSEMIDE 10 MG/ML IJ SOLN
40.0000 mg | Freq: Four times a day (QID) | INTRAMUSCULAR | Status: AC
  Administered 2011-05-20 (×2): 40 mg via INTRAVENOUS
  Filled 2011-05-20 (×2): qty 4

## 2011-05-20 MED ORDER — ADENOSINE 6 MG/2ML IV SOLN: 6.0000 mg | Freq: Once | INTRAVENOUS | Status: AC

## 2011-05-20 MED ORDER — DIGOXIN 0.05 MG/ML PO SOLN
0.5000 mg | Freq: Once | ORAL | Status: AC
  Administered 2011-05-20: 0.5 mg via ORAL
  Filled 2011-05-20: qty 10

## 2011-05-20 MED ORDER — METOPROLOL TARTRATE 1 MG/ML IV SOLN
INTRAVENOUS | Status: AC
  Filled 2011-05-20: qty 5

## 2011-05-20 MED ORDER — AMIODARONE HCL IN DEXTROSE 360-4.14 MG/200ML-% IV SOLN
30.0000 mg/h | INTRAVENOUS | Status: DC
  Administered 2011-05-21 (×2): 30 mg/h via INTRAVENOUS
  Filled 2011-05-20 (×8): qty 200

## 2011-05-20 MED ORDER — OSMOLITE 1.5 CAL PO LIQD
1000.0000 mL | ORAL | Status: DC
Start: 2011-05-20 — End: 2011-05-22
  Administered 2011-05-20 – 2011-05-21 (×2): 1000 mL
  Filled 2011-05-20 (×4): qty 1000

## 2011-05-20 MED ORDER — METOPROLOL TARTRATE 1 MG/ML IV SOLN
INTRAVENOUS | Status: AC
  Administered 2011-05-20: 5 mg
  Filled 2011-05-20: qty 5

## 2011-05-20 MED ORDER — AMIODARONE HCL IN DEXTROSE 360-4.14 MG/200ML-% IV SOLN
INTRAVENOUS | Status: AC
  Filled 2011-05-20: qty 200

## 2011-05-20 NOTE — Progress Notes (Signed)
CRITICAL VALUE ALERT  Critical value received:  co2- 44 (lab from [facility] results)  Date of notification:  05/20/11  Time of notification:  0524  Critical value read back:yes  Nurse who received alert:  Zenovia Jordan, RN/Mavis Ermalene Postin, RN  MD notified (1st page):  Dr. Darrick Penna  Time of first page:  0525  MD notified (2nd page):  Time of second page:  Responding MD:  Dr. Darrick Penna  Time MD responded:  [phone]

## 2011-05-20 NOTE — Progress Notes (Signed)
UR Completed.  [name] [name] [phone] 05/20/2011  

## 2011-05-20 NOTE — Progress Notes (Signed)
INITIAL ADULT NUTRITION ASSESSMENT Date: 05/20/2011   Time: 8:36 AM  Reason for Assessment: Consult- TF Initiation/Management  ASSESSMENT: Female 71 y.o.  Dx: acute-on-chronic respiratory failure  Hx:  Past Medical History  Diagnosis Date  . Hypertension   . Hyperlipidemia   . Cerebrovascular disease, unspecified   . Unspecified glaucoma   . Obesity, unspecified   . COPD (chronic obstructive pulmonary disease)   . Coronary artery disease     cath 04/06/10: LAD occluded with R-L collats; med Rx WTC....probable V. Tach...med Rx  . Ventricular tachycardia   . SVT (supraventricular tachycardia)   . AAA (abdominal aortic aneurysm)     Related Meds:     . antiseptic oral rinse  15 mL Mouth Rinse QID  . aspirin  324 mg Oral Once  . azithromycin  500 mg Intravenous Q24H  . cefTRIAXone (ROCEPHIN)  IV  1 g Intravenous Q24H  . chlorhexidine  15 mL Mouth Rinse BID  . clonazePAM  0.5 mg Oral TID  . clopidogrel  75 mg Per Tube Daily  . furosemide      . furosemide  40 mg Intravenous Q6H  . heparin  5,000 Units Subcutaneous Q8H  . insulin aspart  0-3 Units Subcutaneous Q4H  . ipratropium  4 puff Inhalation Q6H  . levalbuterol  4 puff Inhalation Q6H  . methylPREDNISolone (SOLU-MEDROL) injection  60 mg Intravenous Q8H  . metoprolol  5 mg Intravenous Q6H  . pantoprazole (PROTONIX) IV  40 mg Intravenous QHS  . pneumococcal 23 valent vaccine  0.5 mL Intramuscular Tomorrow-1000  . Travoprost (BAK Free)  1 drop Both Eyes QHS    Ht: 5\' 9"  (175.3 cm)  Wt: 164 lb 0.4 oz (74.4 kg)  Ideal Wt: 65.9 kg % Ideal Wt: 113%  Usual Wt:  Wt Readings from Last 5 Encounters:  05/20/11 164 lb 0.4 oz (74.4 kg)  04/01/11 160 lb (72.576 kg)  03/28/11 162 lb 6.4 oz (73.664 kg)  07/10/10 169 lb 4 oz (76.771 kg)  04/23/10 168 lb (76.204 kg)   % Usual Wt: 96.9%  Body mass index is 24.22 kg/(m^2). WNL  Food/Nutrition Related Hx: Per pt's husband, pt was eating well with a good appetite PTA. Pt  with no unintentional weight loss.  Labs:  CMP     Component Value Date/Time   NA 143 05/20/2011 0430   K 3.4* 05/20/2011 0430   CL 94* 05/20/2011 0430   CO2 44* 05/20/2011 0430   GLUCOSE 125* 05/20/2011 0430   BUN 23 05/20/2011 0430   CREATININE 0.51 05/20/2011 0430   CALCIUM 8.8 05/20/2011 0430   GFRNONAA >90 05/20/2011 0430   GFRAA >90 05/20/2011 0430  Magnesium: 1.3 on 05/16/11 (low) Noted K is trending down.   CBG (last 3)   Basename 05/20/11 0335 05/19/11 2351 05/19/11 2002  GLUCAP 127* 119* 101*    Intake/Output Summary (Last 24 hours) at 05/20/11 0843 Last data filed at 05/20/11 0600  Gross per 24 hour  Intake   2414 ml  Output   3805 ml  Net  -1391 ml    Diet Order: NPO  Supplements/Tube Feeding: N/A  IVF:    sodium chloride Last Rate: 100 mL/hr at 05/20/11 0107  dextrose    MVe: 7.5 Temp: 37.4 C  Estimated Nutritional Needs:   Kcal: 1552 Protein: 90-105 grams Fluid: >1.5 L  Pt required intubation on 3/20, was extubated on 3/22, and re-intubated on 3/23. Pt's anxiety remains a significant factor in weaning pt.  Pt  awake and resting with husband at bedside. Pt with likely pulmonary edema and not worsening PNA. Pt currently receiving IV lasix.  Stage 1 pressure ulcer and mild-moderate pitting edema of extremities noted.  NUTRITION DIAGNOSIS: -Inadequate oral intake (NI-2.1).  Status: Ongoing  RELATED TO: inability to eat  AS EVIDENCE BY: need for vent support, NPO status  MONITORING/EVALUATION(Goals): Goal: TF to meet 95-105% of estimated nutrition needs. Monitor: need for vent support; TF tolerance/adequacy; weight trend; Mg, K, Phos levels  EDUCATION NEEDS: -No education needs identified at this time  INTERVENTION:  Initiate TF via OG tube with Osmolite 1.5 @ 15 mll/hr, increase by 10 ml every 4 hours, to goal rate of 35 ml/hr with 30 ml Prostat TID, to provide 1476 kcals (95% estimated kcal needs), 98 grams protein (100% estimated protein  needs), and 638 ml free water daily.  Recommend repleting Potassium and Magnesium as needed. Noted labs trending down.  Dietitian #[phone]  DOCUMENTATION CODES Per approved criteria  -Not Applicable    Karenann Cai 05/20/2011, 8:36 AM  Hettie Holstein [phone]

## 2011-05-20 NOTE — Progress Notes (Signed)
Name: KINDSEY EBLIN MRN: [id number] DOB: 10/22/1940    LOS: 5  [facility]  History of Present Illness:   20 YOWF COPD (oxygen dependant 2.5 liters), presented via EMS on 3/20 w/ CC:  not feeling well for 2-3 days was ambulatory upon EMS arrival, but patient's condition deteriorated in route. Upon arrival to the ED, she was nonverbal, and leaning forward, with expiratory wheezing. Patient was placed on BiPAP immediately. No reported fevers. No chest pains. Failed attempt at NIPPV and therefore intubated. She was given Solu-Medrol and multiple breathing treatments in route. Required pressors transiently post intubation.    Lines / Drains: OETT 3/20>>>3/22 ETT 3/24>>> Left IJ CVL 3/20>>>  Cultures: Sputum 3/20>>> BCx2 3/20>>> UC 3/20>>> PCT 3/20>>> MRSA PCR>>>neg  Antibiotics: Rocephin 3/20>>> azithro 3/20>>>3/25  Tests / Events: Echo 3/20>>> reintubated 3/24  SUBJECTIVE: failed weaning today, wheezing  Denies CP, breathing better Vital Signs:  Filed Vitals:   05/20/11 0500 05/20/11 0600 05/20/11 0736 05/20/11 0800  BP: 118/46 151/88    Pulse: 68 102    Temp:   98.5 F (36.9 C)   TempSrc:   Oral   Resp: 13 15    Height:      Weight:      SpO2: 94% 91%  96%     I/O last 3 completed shifts: In: 3914 [I.V.:3500; NG/GT:350; IV Piggyback:64] Out: 4425 [Urine:4425]  Physical Examination: Gen: comfortable on vent HEENT: NCAT, PERRL, ETT in place PULM: active exp wheezing bilateral CV: RRR, no mgr, no JVD AB: BS+, soft, nontender Ext: trace edema Neuro: sedated rass goal met  Ventilator settings: Vent Mode:  [-] PSV;CPAP FiO2 (%):  [40 %-40.4 %] 40 % Set Rate:  [10 bmp] 10 bmp Vt Set:  [500 mL] 500 mL PEEP:  [4.4 cmH20-5 cmH20] 5 cmH20 Pressure Support:  [5 cmH20] 5 cmH20 Plateau Pressure:  [19 cmH20-22 cmH20] 19 cmH20  Labs and Imaging:  3/24 CXR: RLL infiltrate and effusion  Lab 05/20/11 0430 05/19/11 0403 05/18/11 0500  NA 143 141  142  K 3.4* 4.0 4.2  CL 94* 101 101  CO2 44* 37* 35*  BUN 23 22 18   CREATININE 0.51 0.45* 0.44*  GLUCOSE 125* 120* 117*    Lab 05/20/11 0430 05/19/11 0403 05/18/11 0500  HGB 13.4 13.1 13.6  HCT 41.8 40.8 41.2  WBC 9.0 11.8* 9.6  PLT 161 149* 139*    Assessment and Plan:  Acute-on-chronic respiratory failure in setting of possible CAP (minimal infiltrate) and Acute exacerbation of chronic obstructive pulmonary disease (COPD): 3/25 CXR- no major changes, no infiltrates  plan: -neg balance successful -pcxr in am  -solumedral to maintain Maintain BDer's Wean cpap 5 ps 5 , goal 30 min , failed, increase Ps  Consider early trach, will d/w family? Maintain lasix to neg 500 cc, follow CO2 Repeat trop , contribution? To failed weaning Dc metoprolol  CAD - pos trops -Resume asa/plavix -TTE pending  Acute encephalopathy: multifactorial, mostly hypercarbia, but may be complicated by acute infection Plan: -monitor, supportive care Home benzo not seen Anxiety, conitnue benzo Chair , mobilize  Hyperglycemia Plan: -ssi  Anxiety Assessment: significant factor in weaning Plan: -schedule clonazepam 0.5 tid -PRN ativan  Ensure TF on going  R/o bronchitis Dc azithro Low thrshold if spike to change to ceftaz  Husband updated at bedside at length  Ccm 30 min    Best practices / Disposition: -->ICU status under PCCM -->full code -->Heparin for DVT Px -->Protonix for GI Px -->  ventilator bundle -->diet: start tube feeding today -->family updated at bedside  CC time: 30 minutes.  Kizzie Bane PCCM Pager: [phone] If no response, call [phone]

## 2011-05-20 NOTE — Progress Notes (Signed)
ANTIBIOTIC CONSULT NOTE - FOLLOW UP  Pharmacy Consult for Rocephin  Indication: Empiric CAP & sepsis coverage  No Known Allergies  Patient Measurements: Height: 5\' 9"  (175.3 cm) Weight: 164 lb 0.4 oz (74.4 kg) IBW/kg (Calculated) : 66.2   Vital Signs: Temp: 98.5 [name] (36.9 C) (03/25 0736) Temp src: Oral (03/25 0736) BP: 151/88 mmHg (03/25 0600) Pulse Rate: 102  (03/25 0600) Intake/Output from previous day: 03/24 0701 - 03/25 0700 In: 2544 [I.V.:2300; NG/GT:180; IV Piggyback:64] Out: 3805 [Urine:3805] Intake/Output from this shift:    Labs:  Basename 05/20/11 0430 05/19/11 0403 05/18/11 0500  WBC 9.0 11.8* 9.6  HGB 13.4 13.1 13.6  PLT 161 149* 139*  LABCREA -- -- --  CREATININE 0.51 0.45* 0.44*   Estimated Creatinine Clearance: 69.4 ml/min (by C-G formula based on Cr of 0.51). No results found for this basename: VANCOTROUGH:2,VANCOPEAK:2,VANCORANDOM:2,GENTTROUGH:2,GENTPEAK:2,GENTRANDOM:2,TOBRATROUGH:2,TOBRAPEAK:2,TOBRARND:2,AMIKACINPEAK:2,AMIKACINTROU:2,AMIKACIN:2, in the last 72 hours   Microbiology: Recent Results (from the past 720 hour(s))  CULTURE, BLOOD (ROUTINE X 2)     Status: Normal (Preliminary result)   Collection Time   05/15/11  1:01 PM      Component Value Range Status Comment   Specimen Description BLOOD HAND LEFT   Final    Special Requests     Final    Value: BOTTLES DRAWN AEROBIC AND ANAEROBIC AERO 10CC,ANAE 6CC   Culture  Setup Time [id number]   Final    Culture     Final    Value:        BLOOD CULTURE RECEIVED NO GROWTH TO DATE CULTURE WILL BE HELD FOR 5 DAYS BEFORE ISSUING A FINAL NEGATIVE REPORT   Report Status PENDING   Incomplete   URINE CULTURE     Status: Normal   Collection Time   05/15/11  1:34 PM      Component Value Range Status Comment   Specimen Description URINE, CATHETERIZED   Final    Special Requests NONE   Final    Culture  Setup Time [id number]   Final    Colony Count NO GROWTH   Final    Culture NO GROWTH   Final    Report Status 05/16/2011 FINAL   Final   MRSA PCR SCREENING     Status: Normal   Collection Time   05/15/11  2:45 PM      Component Value Range Status Comment   MRSA by PCR NEGATIVE  NEGATIVE  Final   CULTURE, BLOOD (ROUTINE X 2)     Status: Normal (Preliminary result)   Collection Time   05/15/11  3:10 PM      Component Value Range Status Comment   Specimen Description BLOOD NECK LEFT   Final    Special Requests     Final    Value: BOTTLES DRAWN AEROBIC AND ANAEROBIC 7CC LEFT IJ CATH   Culture  Setup Time [id number]   Final    Culture     Final    Value:        BLOOD CULTURE RECEIVED NO GROWTH TO DATE CULTURE WILL BE HELD FOR 5 DAYS BEFORE ISSUING A FINAL NEGATIVE REPORT   Report Status PENDING   Incomplete   CULTURE, RESPIRATORY     Status: Normal   Collection Time   05/15/11  4:33 PM      Component Value Range Status Comment   Specimen Description TRACHEAL ASPIRATE   Final    Special Requests NONE   Final    Gram Stain  Final    Value: FEW WBC PRESENT,BOTH PMN AND MONONUCLEAR     FEW SQUAMOUS EPITHELIAL CELLS PRESENT     RARE GRAM POSITIVE COCCI IN PAIRS   Culture Non-Pathogenic Oropharyngeal-type Flora Isolated.   Final    Report Status 05/18/2011 FINAL   Final     Anti-infectives     Start     Dose/Rate Route Frequency Ordered Stop   05/16/11 1500   cefTRIAXone (ROCEPHIN) 1 g in dextrose 5 % 50 mL IVPB        1 g 100 mL/hr over 30 Minutes Intravenous Every 24 hours 05/15/11 1447     05/16/11 1500   azithromycin (ZITHROMAX) 500 mg in dextrose 5 % 250 mL IVPB        500 mg 250 mL/hr over 60 Minutes Intravenous Every 24 hours 05/15/11 1447     05/15/11 1400   cefTRIAXone (ROCEPHIN) 2 g in dextrose 5 % 50 mL IVPB        2 g 100 mL/hr over 30 Minutes Intravenous  Once 05/15/11 1358 05/15/11 1531   05/15/11 1400   azithromycin (ZITHROMAX) 500 mg in dextrose 5 % 250 mL IVPB        500 mg 250 mL/hr over 60 Minutes Intravenous  Once 05/15/11 1358 05/15/11 1500    05/15/11 1300   vancomycin (VANCOCIN) IVPB 1000 mg/200 mL premix        1,000 mg 200 mL/hr over 60 Minutes Intravenous  Once 05/15/11 1254 05/15/11 1600   05/15/11 1300  piperacillin-tazobactam (ZOSYN) IVPB 3.375 g       3.375 g 12.5 mL/hr over 240 Minutes Intravenous  Once 05/15/11 1254 05/15/11 1705          Assessment: 71 yo [name] (w/ hx of COPD; oxygen dependent 2.5L at home) presents on 3/20 for acute on chronic resp failure in setting of CAP and acute exacerbation of COPD.   Anticoagulation: Hep SQ for VTE px   Infectious Disease: Rocephin d6 (3/20 >> current) for CAP and empiric sepsis coverage. Pt received Zosyn in the ED. Afebrile, WBC WNL, SCr 0.45 stable. CrCl~69.4 ml/min. PCT 0.4 (3/22)-trending down, CXR good  Azithro 3/20>>3/25 Cultures:  Sputum-NPF, BCx-ngtd, UCx-neg   Cardiovascular:Hx HTN/DL/CAD/AAA. Off pressors, BP & HR elevated, NSR, On ASA + Plavix. + Troponin (3/22); Metoprolol IV 3/23>>3/25 (d/ced d/t pulm problems).   Endocrinology: No hx DM. CBG<150. On SSI- solumedrol 60 q8h (will maintain to help with COPD exacerbation)  Gastrointestinal / Nutrition: Osmolite, Pro-stat PT. PPI IV-minimal residuals.   Neurology: Hx cerebrovascular disease (unspecified). Anxiety: on scheduled clonazepam, ativan PRN. Sedated on intermittent Fent (22mcg)/Versed (2mg ) prn. RASS 1 (goal 0). Follows commands.  Nephrology: SCr 0.51, CrCl~69 ml/min. UOP 2.32ml/kg/hr on lasix. Hypokalemia (3.4)- KCl PT x 1 dose. Other lytes OK. *Note that patient was on KCl BID PTA.   Pulmonary: VDRF (COPD-oxygen dependent PTA), intubated on 3/20. PSV;CPAP, FiO2 40%. High bicarb, normal pH. On scheduled atrovent and xopenex. If pt can't be weaned by 3/27, chronic trach may be considered per [facility]  Hematology / Oncology: CBC WNL.   PTA Medication Issues: Lipitor, Symbicort, Lasix prn, Toprol-XL, SL nitro   Best Practices: Hep SQ for VTE px, Prot for SUP (vent), MC   Goal of Therapy:   Eradication of infection   Plan:  1. Continue Rocephin 1g IV every 24 hours  2. Azithromycin d/ced today per MD.  3. Will continue to follow renal function, culture results, LOT, and antibiotic de-escalation  plans.   Delphia Grates, PharmD Candidate 05/20/2011,10:36 AM  Agree with above. Recommend stop date for Rocephin in 1-2 days (total of 7-8 days of therapy).  Link Snuffer, PharmD, BCPS Clinical Pharmacist [phone] 05/20/2011,4:07 PM.

## 2011-05-20 NOTE — Progress Notes (Signed)
Name: [name] [name] MRN: [id number] DOB: [birth year]  ELECTRONIC ICU PHYSICIAN NOTE  Problem:  SVT  Intervention:  Attempted Adenosine IV (6 mg followed by 12 mg) and Metoprolol 5 mg IV x 1.  No sustained response.  Started Amiodarone bolus + infusion.  Orlean Bradford, M.D. Pulmonary and Critical Care Medicine [facility] Cell: [phone] Pager: [phone]  05/20/2011, 8:58 PM

## 2011-05-21 ENCOUNTER — Encounter (HOSPITAL_COMMUNITY): Payer: Self-pay | Admitting: Nurse Practitioner

## 2011-05-21 ENCOUNTER — Inpatient Hospital Stay (HOSPITAL_COMMUNITY): Payer: Medicare Other

## 2011-05-21 LAB — COMPREHENSIVE METABOLIC PANEL
ALT: 15 U/L (ref 0–35)
AST: 14 U/L (ref 0–37)
Albumin: 3 g/dL — ABNORMAL LOW (ref 3.5–5.2)
CO2: >45 mEq/L (ref 19–32)
Calcium: 8.7 mg/dL (ref 8.4–10.5)
Creatinine, Ser: 0.53 mg/dL (ref 0.50–1.10)
GFR calc non Af Amer: >90 mL/min (ref >90)
Sodium: 144 mEq/L (ref 135–145)
Total Protein: 5.2 g/dL — ABNORMAL LOW (ref 6.0–8.3)

## 2011-05-21 LAB — CULTURE, BLOOD (ROUTINE X 2): Culture  Setup Time: 201303202304

## 2011-05-21 LAB — CBC
HCT: 39 % (ref 36.0–46.0)
Hemoglobin: 12.4 g/dL (ref 12.0–15.0)
MCH: 31.6 pg (ref 26.0–34.0)
MCHC: 31.8 g/dL (ref 30.0–36.0)
MCV: 99.2 fL (ref 78.0–100.0)
RBC: 3.93 MIL/uL (ref 3.87–5.11)

## 2011-05-21 LAB — DIFFERENTIAL
Basophils Relative: 0 % (ref 0–1)
Eosinophils Absolute: 0 10*3/uL (ref 0.0–0.7)
Lymphs Abs: 0.6 10*3/uL — ABNORMAL LOW (ref 0.7–4.0)
Monocytes Absolute: 0.2 10*3/uL (ref 0.1–1.0)
Monocytes Relative: 4 % (ref 3–12)

## 2011-05-21 LAB — GLUCOSE, CAPILLARY
Glucose-Capillary: 126 mg/dL — ABNORMAL HIGH (ref 70–99)
Glucose-Capillary: 132 mg/dL — ABNORMAL HIGH (ref 70–99)
Glucose-Capillary: 142 mg/dL — ABNORMAL HIGH (ref 70–99)
Glucose-Capillary: 174 mg/dL — ABNORMAL HIGH (ref 70–99)

## 2011-05-21 LAB — BLOOD GAS, ARTERIAL
PEEP: 5 cmH2O
Pressure support: 5 cmH2O
pCO2 arterial: 78.8 mmHg (ref 35.0–45.0)
pH, Arterial: 7.403 — ABNORMAL HIGH (ref 7.350–7.400)
pO2, Arterial: 72.6 mmHg — ABNORMAL LOW (ref 80.0–100.0)

## 2011-05-21 LAB — CARDIAC PANEL(CRET KIN+CKTOT+MB+TROPI): CK, MB: 1.8 ng/mL (ref 0.3–4.0)

## 2011-05-21 MED ORDER — DIGOXIN 0.25 MG/ML IJ SOLN
0.2500 mg | Freq: Once | INTRAMUSCULAR | Status: AC
  Administered 2011-05-21: 0.25 mg via INTRAVENOUS
  Filled 2011-05-21: qty 1

## 2011-05-21 MED ORDER — POTASSIUM CHLORIDE 20 MEQ/15ML (10%) PO LIQD
40.0000 meq | ORAL | Status: AC
  Administered 2011-05-21 (×2): 40 meq
  Filled 2011-05-21 (×2): qty 30

## 2011-05-21 MED ORDER — PANTOPRAZOLE SODIUM 40 MG PO PACK
40.0000 mg | PACK | Freq: Every day | ORAL | Status: DC
  Administered 2011-05-21: 40 mg
  Filled 2011-05-21 (×2): qty 20

## 2011-05-21 MED ORDER — DIGOXIN 0.05 MG/ML PO SOLN
0.5000 mg | Freq: Once | ORAL | Status: DC
  Filled 2011-05-21: qty 10

## 2011-05-21 MED ORDER — DIGOXIN 0.25 MG/ML IJ SOLN
0.1250 mg | Freq: Every day | INTRAMUSCULAR | Status: DC
  Administered 2011-05-22 – 2011-05-24 (×3): 0.125 mg via INTRAVENOUS
  Filled 2011-05-21 (×4): qty 0.5

## 2011-05-21 MED ORDER — METHYLPREDNISOLONE SODIUM SUCC 125 MG IJ SOLR
60.0000 mg | Freq: Two times a day (BID) | INTRAMUSCULAR | Status: DC
  Administered 2011-05-21 – 2011-05-22 (×3): 60 mg via INTRAVENOUS
  Filled 2011-05-21: qty 0.96
  Filled 2011-05-21: qty 2
  Filled 2011-05-21: qty 0.96
  Filled 2011-05-21: qty 2
  Filled 2011-05-21 (×2): qty 0.96

## 2011-05-21 MED ORDER — FUROSEMIDE 10 MG/ML IJ SOLN
40.0000 mg | Freq: Two times a day (BID) | INTRAMUSCULAR | Status: AC
  Administered 2011-05-21 – 2011-05-22 (×4): 40 mg via INTRAVENOUS
  Filled 2011-05-21 (×4): qty 4

## 2011-05-21 MED ORDER — DEXTROSE 5 % IV SOLN
1.0000 g | INTRAVENOUS | Status: AC
  Administered 2011-05-21 – 2011-05-22 (×2): 1 g via INTRAVENOUS
  Filled 2011-05-21 (×2): qty 10

## 2011-05-21 MED ORDER — DIGOXIN 0.25 MG/ML IJ SOLN
0.5000 mg | Freq: Once | INTRAMUSCULAR | Status: AC
  Administered 2011-05-21: 0.5 mg via INTRAVENOUS
  Filled 2011-05-21: qty 2

## 2011-05-21 MED ORDER — POTASSIUM CHLORIDE 10 MEQ/50ML IV SOLN
10.0000 meq | INTRAVENOUS | Status: AC
  Administered 2011-05-21 (×2): 10 meq via INTRAVENOUS
  Filled 2011-05-21 (×2): qty 50

## 2011-05-21 NOTE — Progress Notes (Signed)
Clinical Social Work Department BRIEF PSYCHOSOCIAL ASSESSMENT 05/21/2011  Patient:  [name] [name], [name] [name]     Account Number:  [id number]     Admit date:  05/15/2011  Clinical Social Worker:  Margaree Mackintosh  Date/Time:  05/21/2011 01:00 PM  Referred by:  Physician  Date Referred:  05/20/2011 Referred for  Psychosocial assessment   Other Referral:   Interview type:  Family Other interview type:    PSYCHOSOCIAL DATA Living Status:  FAMILY Admitted from facility:   Level of care:   Primary support name:  [name] [name] Primary support relationship to patient:  SPOUSE Degree of support available:   adequate    CURRENT CONCERNS Current Concerns  Adjustment to Illness   Other Concerns:    SOCIAL WORK ASSESSMENT / PLAN Clinical Social Worker met with pt's spouse at bedside. CSW introduced self and explained role.  CSW provided opportunity for spouse to process difficult feelings.  CSW introduced conversation regarding pt's wishes with health care choices.  Spouse stated "We've never had a conversation like that".  CSW validated difficult feelings related to having a loved one in the hospital.  CSW reviewed support system and coping skills.  CSW to continue to follow and assist as needed.   Assessment/plan status:   Other assessment/ plan:   Information/referral to community resources:    PATIENT'S/FAMILY'S RESPONSE TO PLAN OF CARE: Pt currently appears asleep.  Spouse was pleasant and demonstrated appropriate grief related to hospitalization. Spouse thanked CSW for intervention.        Angelia Mould, MSW, [place] [phone]

## 2011-05-21 NOTE — Progress Notes (Signed)
  Echocardiogram 2D Echocardiogram has been performed.  [name] [name] 05/21/2011, 5:21 PM

## 2011-05-21 NOTE — Progress Notes (Addendum)
Name: [name] [name] MRN: [id number] DOB: [birth year]    LOS: 6  [facility]  History of Present Illness:   58 YOWF COPD (oxygen dependant 2.5 liters), presented via EMS on 3/20 w/ CC:  not feeling well for 2-3 days was ambulatory upon EMS arrival, but patient's condition deteriorated in route. Upon arrival to the ED, she was nonverbal, and leaning forward, with expiratory wheezing. Patient was placed on BiPAP immediately. No reported fevers. No chest pains. Failed attempt at NIPPV and therefore intubated. She was given Solu-Medrol and multiple breathing treatments in route. Required pressors transiently post intubation.    Lines / Drains: OETT 3/20>>>3/22 ETT 3/24>>> Left IJ CVL 3/20>>>  Cultures: Sputum 3/20>>>NF BCx2 3/20>>> UC 3/20>>> PCT 3/20>>> MRSA PCR>>>neg  Antibiotics: Rocephin 3/20>>>stop 28th azithro 3/20>>>3/25  Tests / Events: Echo 3/20>>> reintubated 3/24  SUBJECTIVE:  Neg blaance, svt   Denies CP, breathing better Vital Signs:  Filed Vitals:   05/21/11 0500 05/21/11 0600 05/21/11 0700 05/21/11 0747  BP: 112/50 138/64 127/57   Pulse: 64 68 70   Temp:    98.9 F (37.2 C)  TempSrc:    Oral  Resp: 15 14 16    Height:      Weight:      SpO2: 91% 96% 92%      I/O last 3 completed shifts: In: 4417 [I.V.:3859; NG/GT:465; IV Piggyback:93] Out: 6490 [Urine:6490]  Physical Examination: Gen: comfortable on vent HEENT: jvd less PULM: active exp wheezing bilateral improved, anterior clear CV: RRR, no mgr, no JVD AB: BS+, soft, nontender Ext: trace edema reduced Neuro: sedated rass goal met  Ventilator settings: Vent Mode:  [-] PSV;CPAP FiO2 (%):  [39.9 %-40.6 %] 40 % Set Rate:  [10 bmp] 10 bmp Vt Set:  [500 mL] 500 mL PEEP:  [5 cmH20] 5 cmH20 Pressure Support:  [5 cmH20] 5 cmH20 Plateau Pressure:  [16 cmH20-24 cmH20] 20 cmH20  Labs and Imaging:  3/24 CXR: RLL infiltrate and effusion  Lab 05/21/11 0415 05/20/11 0430 05/19/11  0403  NA 144 143 141  K 2.8* 3.4* 4.0  CL 93* 94* 101  CO2 >45* 44* 37*  BUN 27* 23 22  CREATININE 0.53 0.51 0.45*  GLUCOSE 178* 125* 120*    Lab 05/21/11 0415 05/20/11 0430 05/19/11 0403  HGB 12.4 13.4 13.1  HCT 39.0 41.8 40.8  WBC 6.1 9.0 11.8*  PLT 141* 161 149*    Assessment and Plan:  Acute-on-chronic respiratory failure in setting of possible CAP (minimal infiltrate) and Acute exacerbation of chronic obstructive pulmonary disease (COPD): plan: -better off metoprolol -wean this am cpap 5 ps 5 goal 2 hr -continue  Neg balance Control rate bders Steroids to q12h  CAD - pos trops -asa/plavix -TTE  - pending? Check on this  Acute encephalopathy: multifactorial, mostly hypercarbia, but may be complicated by acute infection Plan: Anxiety, conitnue benzo Chair , mobilize  Hyperglycemia Plan: -ssi Reduce steroids  Anxiety Assessment: significant factor in weaning Plan: -schedule clonazepam 0.5 tid -PRN ativan  Ensure TF at goal ppi  R/o bronchitis Dc azithro Low thrshold if spike to change to ceftaz, afebrile, no change wbc Improved weaning overall this am   SVT Dig load Ami long term not a good option, hope to dc in am  Tele Replace lytes  Husband updated at bedside at length  Ccm 30 min    Best practices / Disposition: -->ICU status under PCCM -->full code -->Heparin for DVT Px -->Protonix for GI Px -->ventilator bundle -->  diet: start tube feeding today -->family updated at bedside  CC time: 30 minutes.  Mcarthur Rossetti. Tyson Alias, MD, FACP Pgr: [phone] [facility]

## 2011-05-21 NOTE — Progress Notes (Signed)
eLink Physician-Brief Progress Note Patient Name: [name] [name] DOB: [birth year] MRN: [id number]  Date of Service  05/21/2011   HPI/Events of Note  Hypokalemia   eICU Interventions  Potassium replaced   Intervention Category Intermediate Interventions: Electrolyte abnormality - evaluation and management  Monserratt Knezevic 05/21/2011, 5:23 AM

## 2011-05-21 NOTE — Progress Notes (Signed)
CRITICAL VALUE ALERT  Critical value received:  CO2->45  Date of notification:  3/26   Time of notification:  0540  Critical value read back:yes  Nurse who received alert:  Gerre Pebbles   MD notified (1st page):  Dr Deterging  Time of first page:  0540  MD notified (2nd page):  Time of second page:  Responding MD:  Dr Darrick Penna  Time MD responded:  [phone]

## 2011-05-22 ENCOUNTER — Inpatient Hospital Stay (HOSPITAL_COMMUNITY): Payer: Medicare Other

## 2011-05-22 LAB — DIFFERENTIAL
Basophils Absolute: 0 10*3/uL (ref 0.0–0.1)
Basophils Relative: 0 % (ref 0–1)
Eosinophils Absolute: 0 10*3/uL (ref 0.0–0.7)
Eosinophils Relative: 0 % (ref 0–5)
Lymphocytes Relative: 3 % — ABNORMAL LOW (ref 12–46)
Monocytes Absolute: 0.8 10*3/uL (ref 0.1–1.0)

## 2011-05-22 LAB — CBC
MCHC: 32.1 g/dL (ref 30.0–36.0)
MCV: 102 fL — ABNORMAL HIGH (ref 78.0–100.0)
Platelets: 139 10*3/uL — ABNORMAL LOW (ref 150–400)
RDW: 13.2 % (ref 11.5–15.5)
WBC: 9.1 10*3/uL (ref 4.0–10.5)

## 2011-05-22 LAB — BASIC METABOLIC PANEL
CO2: >45 mEq/L (ref 19–32)
Calcium: 8.6 mg/dL (ref 8.4–10.5)
Creatinine, Ser: 0.5 mg/dL (ref 0.50–1.10)
GFR calc Af Amer: >90 mL/min (ref >90)
GFR calc non Af Amer: >90 mL/min (ref >90)
Sodium: 146 mEq/L — ABNORMAL HIGH (ref 135–145)

## 2011-05-22 LAB — PHOSPHORUS: Phosphorus: 3.2 mg/dL (ref 2.3–4.6)

## 2011-05-22 LAB — MAGNESIUM: Magnesium: 2.3 mg/dL (ref 1.5–2.5)

## 2011-05-22 MED ORDER — LEVALBUTEROL HCL 0.63 MG/3ML IN NEBU
0.6300 mg | INHALATION_SOLUTION | Freq: Four times a day (QID) | RESPIRATORY_TRACT | Status: DC
  Administered 2011-05-22 – 2011-05-28 (×20): 0.63 mg via RESPIRATORY_TRACT
  Filled 2011-05-22 (×28): qty 3

## 2011-05-22 MED ORDER — POTASSIUM CHLORIDE 10 MEQ/50ML IV SOLN
10.0000 meq | INTRAVENOUS | Status: AC
  Administered 2011-05-22 (×3): 10 meq via INTRAVENOUS
  Filled 2011-05-22 (×2): qty 50

## 2011-05-22 MED ORDER — IPRATROPIUM BROMIDE 0.02 % IN SOLN
0.5000 mg | Freq: Four times a day (QID) | RESPIRATORY_TRACT | Status: DC
  Administered 2011-05-22 – 2011-05-28 (×20): 0.5 mg via RESPIRATORY_TRACT
  Filled 2011-05-22 (×22): qty 2.5

## 2011-05-22 MED ORDER — PANTOPRAZOLE SODIUM 40 MG PO TBEC
40.0000 mg | DELAYED_RELEASE_TABLET | Freq: Every day | ORAL | Status: DC
  Administered 2011-05-22 – 2011-06-02 (×12): 40 mg via ORAL
  Filled 2011-05-22 (×11): qty 1

## 2011-05-22 MED ORDER — ACETAMINOPHEN 325 MG PO TABS
650.0000 mg | ORAL_TABLET | Freq: Four times a day (QID) | ORAL | Status: DC | PRN
  Administered 2011-05-22 – 2011-06-04 (×13): 650 mg via ORAL
  Filled 2011-05-22 (×5): qty 2
  Filled 2011-05-22 (×2): qty 1
  Filled 2011-05-22 (×6): qty 2

## 2011-05-22 NOTE — Progress Notes (Signed)
Name: TAMILYN LUPIEN MRN: [id number] DOB: 04/05/1940    LOS: 7  [place] [facility]  History of Present Illness:   32 YOWF COPD (oxygen dependant 2.5 liters), presented via EMS on 3/20 w/ CC:  not feeling well for 2-3 days was ambulatory upon EMS arrival, but patient's condition deteriorated in route. Upon arrival to the ED, she was nonverbal, and leaning forward, with expiratory wheezing. Patient was placed on BiPAP immediately. No reported fevers. No chest pains. Failed attempt at NIPPV and therefore intubated. She was given Solu-Medrol and multiple breathing treatments in route. Required pressors transiently post intubation.    Lines / Drains: OETT 3/20>>>3/22 ETT 3/24>>> Left IJ CVL 3/20>>>  Cultures: Sputum 3/20>>>NF BCx2 3/20>>> UC 3/20>>> PCT 3/20>>> MRSA PCR>>>neg  Antibiotics: Rocephin 3/20>>>stop 28th azithro 3/20>>>3/25  Tests / Events: Echo 3/20>>> reintubated 3/24  SUBJECTIVE:  No svt, neg blance , weaned reasonable 3/26  Denies CP, breathing better Vital Signs:  Filed Vitals:   05/22/11 0600 05/22/11 0700 05/22/11 0757 05/22/11 0800  BP: 116/47 119/51  144/62  Pulse: 61 65  76  Temp:   97.9 F (36.6 C)   TempSrc:   Oral   Resp: 17 13  20   Height:      Weight:      SpO2: 95% 95%  93%     I/O last 3 completed shifts: In: 4719.2 [I.V.:3121.2; Other:100; NG/GT:1215; IV Piggyback:283] Out: 4905 [Urine:4905]  Physical Examination: Gen: comfortable on vent, volume wnl on wan for ht HEENT: jvd low PULM: clear anterior CV: RRR, no mgr, no JVD AB: BS+, soft, nontender Ext: trace edema  Neuro: sedated rass 1  Ventilator settings: Vent Mode:  [-] PSV;CPAP FiO2 (%):  [39.6 %-40.4 %] 40 % Set Rate:  [10 bmp] 10 bmp Vt Set:  [0 mL-500 mL] 0 mL PEEP:  [5 cmH20] 5 cmH20 Pressure Support:  [5 cmH20] 5 cmH20  Labs and Imaging:  3/25 pcxr- no hcnages  Lab 05/22/11 0435 05/21/11 0415 05/20/11 0430  NA 146* 144 143  K 3.3* 2.8* 3.4*  CL  94* 93* 94*  CO2 >45* >45* 44*  BUN 28* 27* 23  CREATININE 0.50 0.53 0.51  GLUCOSE 162* 178* 125*    Lab 05/22/11 0435 05/21/11 0415 05/20/11 0430  HGB 13.1 12.4 13.4  HCT 40.8 39.0 41.8  WBC 9.1 6.1 9.0  PLT 139* 141* 161    Assessment and Plan:  Acute-on-chronic respiratory failure in setting of possible CAP (minimal infiltrate) and Acute exacerbation of chronic obstructive pulmonary disease (COPD): plan: -better off metoprolol, avoid -wean this am cpap 5 ps 5 goal 30 min , assess rsbi -continue  Neg balance, has tolerated well, although no major changes in PCXR, has improved clinically Control rate, dig loaded 3/26 successful bders Steroids to q12h, maintain for now  pcxr in am  Trying to avoid trach, hope for extubation today May consider to NIMV from ETT  CAD - pos trops -asa/plavix -echo pending Has tolerated lasix well, does she have cardiomyoapthy?  Acute encephalopathy: multifactorial, mostly hypercarbia, but may be complicated by acute infection Plan: Anxiety, conitnue benzo Chair , mobilize Chair position goal  Hyperglycemia Plan: -ssi No changes steroids today  Anxiety Assessment: significant factor in weaning Plan: -schedule clonazepam 0.5 tid -PRN ativan -if to NIMV may need additional  Ensure TF at goal, hold weaning ppi  R/o bronchitis Ceftriaxone to stop today  SVT Dig load improved, monitor crt Ami long term not a good option, dc amio  Tele Replace lytes , K   Husband updated at bedside at length  Ccm 30 min    Best practices / Disposition: -->ICU status under PCCM -->full code -->Heparin for DVT Px -->Protonix for GI Px -->ventilator bundle -->diet: start tube feeding today -->family updated at bedside  CC time: 30 minutes.  Mcarthur Rossetti. Tyson Alias, MD, FACP Pgr: [phone] [place] [facility]

## 2011-05-22 NOTE — Evaluation (Signed)
Clinical/Bedside Swallow Evaluation Patient Details  Name: DALLAS SCORSONE MRN: [id number] DOB: [birth year] Today's Date: 05/22/2011  Past Medical History:  Past Medical History  Diagnosis Date  . Hypertension   . Hyperlipidemia   . Cerebrovascular disease, unspecified   . Unspecified glaucoma   . Obesity, unspecified   . COPD (chronic obstructive pulmonary disease)   . Coronary artery disease     cath 04/06/10: LAD occluded with R-L collats; med Rx WTC....probable V. Tach...med Rx  . Ventricular tachycardia   . SVT (supraventricular tachycardia)   . AAA (abdominal aortic aneurysm)    Past Surgical History:  Past Surgical History  Procedure Date  . Cystoscopy   . Bladder tumor excision     resection of 5 bladder, and fulguration of 1 small bladder tumor  . Cystoscopy     cold cup bladder biopsy of five tumors, and fulguration of one tumor  . Lung surgery     Remote right lung  . Tubal ligation     Bilateral   HPI:  71 y.o. female with COPD (oxygen dependant 2.5 liters), presented via EMS on 3/20 w/ CC:  not feeling well for 2-3 days was ambulatory upon EMS arrival, but patient's condition deteriorated in route.  Intubated 3/20-3/22 then again 3/24-3/27/13.    Assessment/Recommendations/Treatment Plan    SLP Assessment Clinical Impression Statement: Demonstrates an overall normal oral-pharyngeal swallow with high risk for aspiration due to respiratory status with frequent desaturation (baseline 90-95% to low 80's) and increased RR (30's) with PO trials due to breathing/swallowing coordination and easy taxing of this process with progressive trials. Patient very eager to consume entire selection of PO's trialed, requiring verbal cues and education that her Spo2 was below 85 at the time and BP was 170/81.  Patient's husband also assisted in educating his wife to hold off on further PO.  As respiratory status and endurance improves, aspiration risk will diminish.  Will closely follow and  recommend patient to consume PO's in short intervals in order to allow respiratory recovery.  Risk for Aspiration: Moderate  Swallow Evaluation Recommendations Diet Recommendations: Regular;Thin liquid Liquid Administration via: Cup;Straw Medication Administration: Whole meds with liquid Supervision: Patient able to self feed;Full supervision/cueing for compensatory strategies Compensations: Slow rate;Small sips/bites;Follow solids with liquid Postural Changes and/or Swallow Maneuvers: Seated upright 90 degrees;Upright 30-60 min after meal (Respiratory breaks throughout intermittent PO's) Oral Care Recommendations: Oral care BID;Oral care before and after PO;Staff/trained caregiver to provide oral care Follow up Recommendations: None  Treatment Plan Treatment Plan Recommendations: Therapy as outlined in treatment plan below Speech Therapy Frequency: min 3x week Treatment Duration: 2 weeks Interventions: Aspiration precaution training;Compensatory techniques;Patient/family education;Diet toleration management by SLP  Prognosis Prognosis for Safe Diet Advancement: Good  Individuals Consulted Consulted and Agree with Results and Recommendations: Patient;Family member/caregiver;MD;RN Family Member Consulted: Husband  Swallowing Goals  SLP Swallowing Goals Patient will consume recommended diet without observed clinical signs of aspiration with: Modified independent assistance Patient will utilize recommended strategies during swallow to increase swallowing safety with: Modified independent assistance  Myra Rude, M.S.,CCC-SLP Pager [phone] 05/22/2011,2:24 PM

## 2011-05-22 NOTE — Progress Notes (Signed)
eLink Physician-Brief Progress Note Patient Name: [name] [name] DOB: 03/31/1940 MRN: [id number]  Date of Service  05/22/2011   HPI/Events of Note   Hypokalemia  eICU Interventions  IV KCL ordered   Intervention Category Major Interventions: Electrolyte abnormality - evaluation and management  Shan Levans 05/22/2011, 5:18 AM

## 2011-05-22 NOTE — Progress Notes (Signed)
CRITICAL VALUE ALERT  Critical value received:  CO2 >45 (Based on BMET lab)  Date of notification:  05/22/2011  Time of notification:  0524  Critical value read back:yes  Nurse who received alert:  Gerre Pebbles, RN  MD notified (1st page):  Dr Delford Field  Time of first page:  0524  MD notified (2nd page):  Time of second page:  Responding MD:  Dr Delford Field  Time MD responded:  [phone]

## 2011-05-22 NOTE — Procedures (Signed)
Extubation Procedure Note  Patient Details:   Name: [name] [name] DOB: [birth year] MRN: [id number]   Airway Documentation:  Airway 7.5 mm (Active)    Evaluation  O2 sats: stable throughout Complications: No apparent complications Patient did tolerate procedure well. Bilateral Breath Sounds: Diminished Suctioning: Airway Yes  Clearance Coots 05/22/2011, 9:30 AM Patient extubated and 4lpm [place] started. Sats dipped to 85% so 40%venturi mask put on pt. Patient states she's comfortable. She is lying on her left side with 40% ventimask on and sats 89-92%, RR 18-19bpm.

## 2011-05-23 ENCOUNTER — Encounter (HOSPITAL_COMMUNITY): Payer: Self-pay | Admitting: Physician Assistant

## 2011-05-23 ENCOUNTER — Inpatient Hospital Stay (HOSPITAL_COMMUNITY): Payer: Medicare Other

## 2011-05-23 DIAGNOSIS — I471 Supraventricular tachycardia, unspecified: Secondary | ICD-10-CM

## 2011-05-23 DIAGNOSIS — I2510 Atherosclerotic heart disease of native coronary artery without angina pectoris: Secondary | ICD-10-CM | POA: Diagnosis present

## 2011-05-23 DIAGNOSIS — R7989 Other specified abnormal findings of blood chemistry: Secondary | ICD-10-CM

## 2011-05-23 LAB — BASIC METABOLIC PANEL
BUN: 23 mg/dL (ref 6–23)
CO2: >45 mEq/L (ref 19–32)
Calcium: 9.2 mg/dL (ref 8.4–10.5)
Creatinine, Ser: 0.47 mg/dL — ABNORMAL LOW (ref 0.50–1.10)
GFR calc non Af Amer: >90 mL/min (ref >90)
Glucose, Bld: 134 mg/dL — ABNORMAL HIGH (ref 70–99)

## 2011-05-23 LAB — GLUCOSE, CAPILLARY
Glucose-Capillary: 120 mg/dL — ABNORMAL HIGH (ref 70–99)
Glucose-Capillary: 114 mg/dL — ABNORMAL HIGH (ref 70–99)
Glucose-Capillary: 130 mg/dL — ABNORMAL HIGH (ref 70–99)

## 2011-05-23 LAB — MAGNESIUM: Magnesium: 2.1 mg/dL (ref 1.5–2.5)

## 2011-05-23 MED ORDER — METHYLPREDNISOLONE SODIUM SUCC 40 MG IJ SOLR
40.0000 mg | Freq: Two times a day (BID) | INTRAMUSCULAR | Status: DC
  Administered 2011-05-23 – 2011-05-25 (×6): 40 mg via INTRAVENOUS
  Filled 2011-05-23 (×9): qty 1

## 2011-05-23 MED ORDER — INSULIN ASPART 100 UNIT/ML ~~LOC~~ SOLN
0.0000 [IU] | Freq: Three times a day (TID) | SUBCUTANEOUS | Status: DC
  Administered 2011-05-25 – 2011-05-27 (×3): 2 [IU] via SUBCUTANEOUS
  Administered 2011-05-29: 1 [IU] via SUBCUTANEOUS
  Administered 2011-05-30: 2 [IU] via SUBCUTANEOUS
  Administered 2011-05-31 – 2011-06-02 (×4): 1 [IU] via SUBCUTANEOUS

## 2011-05-23 MED ORDER — HYDRALAZINE HCL 25 MG PO TABS
25.0000 mg | ORAL_TABLET | Freq: Three times a day (TID) | ORAL | Status: DC
  Administered 2011-05-23 – 2011-05-30 (×20): 25 mg via ORAL
  Filled 2011-05-23 (×25): qty 1

## 2011-05-23 MED ORDER — DILTIAZEM HCL ER COATED BEADS 120 MG PO CP24
120.0000 mg | ORAL_CAPSULE | Freq: Every day | ORAL | Status: DC
  Administered 2011-05-23 – 2011-05-25 (×3): 120 mg via ORAL
  Filled 2011-05-23 (×4): qty 1

## 2011-05-23 MED ORDER — FUROSEMIDE 10 MG/ML IJ SOLN
20.0000 mg | Freq: Two times a day (BID) | INTRAMUSCULAR | Status: AC
  Administered 2011-05-23 – 2011-05-24 (×2): 20 mg via INTRAVENOUS
  Filled 2011-05-23 (×2): qty 2

## 2011-05-23 MED ORDER — ISOSORBIDE MONONITRATE ER 30 MG PO TB24
30.0000 mg | ORAL_TABLET | Freq: Every day | ORAL | Status: DC
  Administered 2011-05-23 – 2011-06-04 (×13): 30 mg via ORAL
  Filled 2011-05-23 (×13): qty 1

## 2011-05-23 MED ORDER — ATORVASTATIN CALCIUM 40 MG PO TABS
40.0000 mg | ORAL_TABLET | Freq: Every day | ORAL | Status: DC
  Administered 2011-05-24 – 2011-06-02 (×9): 40 mg via ORAL
  Filled 2011-05-23 (×12): qty 1

## 2011-05-23 MED ORDER — POTASSIUM CHLORIDE CRYS ER 20 MEQ PO TBCR
40.0000 meq | EXTENDED_RELEASE_TABLET | Freq: Once | ORAL | Status: AC
  Administered 2011-05-23: 40 meq via ORAL
  Filled 2011-05-23: qty 2

## 2011-05-23 NOTE — Progress Notes (Signed)
UR Completed.  [name] [name] [phone] 05/23/2011  

## 2011-05-23 NOTE — Progress Notes (Signed)
SLP Cancellation Note  Treatment cancelled today due to patient receiving nursing care for rectal tube.  Unable to complete a visit.  RN reported (through nurse tech) that patient has been tolerating PO diet without significant s/o aspiration and improved respiratory status.  Patient to transfer to lower level of care today. Will f/u in a.m. On 3/29  Myra Rude, M.S.,CCC-SLP Pager [phone] 05/23/2011, 2:50 PM

## 2011-05-23 NOTE — Progress Notes (Signed)
OT Cancellation Note  Treatment cancelled today due to : pt receiving RN care for rectal tubing. Will attempt again tomorrow. Thanks! [name] [name], [name] [name] Pager: [phone] 05/23/2011    [name] [name] 05/23/2011, 4:42 PM

## 2011-05-23 NOTE — Evaluation (Signed)
Physical Therapy Evaluation Patient Details Name: [name] [name] MRN: [id number] DOB: 1940/03/24 Today's Date: 05/23/2011  Problem List:  Patient Active Problem List  Diagnoses  . HYPERLIPIDEMIA  . OBESITY  . TOBACCO ABUSE  . GLAUCOMA  . HYPERTENSION  . SUPRAVENTRICULAR TACHYCARDIA  . CAROTID ARTERY STENOSIS  . CEREBROVASCULAR DISEASE  . ABDOMINAL AORTIC ANEURYSM  . COPD  . DYSPNEA  . HYPOKALEMIA  . CAD  . VENTRICULAR TACHYCARDIA  . Acute-on-chronic respiratory failure  . Acute exacerbation of chronic obstructive pulmonary disease (COPD)  . CAP (community acquired pneumonia)  . SIRS (systemic inflammatory response syndrome)  . Septic shock  . Hyperglycemia  . Acute encephalopathy  . Protein calorie malnutrition    Past Medical History:  Past Medical History  Diagnosis Date  . Hypertension   . Hyperlipidemia   . Cerebrovascular disease, unspecified   . Unspecified glaucoma   . Obesity, unspecified   . COPD (chronic obstructive pulmonary disease)   . Coronary artery disease     cath 04/06/10: LAD occluded with R-L collats; med Rx WTC....probable V. Tach...med Rx  . Ventricular tachycardia   . SVT (supraventricular tachycardia)   . AAA (abdominal aortic aneurysm)    Past Surgical History:  Past Surgical History  Procedure Date  . Cystoscopy   . Bladder tumor excision     resection of 5 bladder, and fulguration of 1 small bladder tumor  . Cystoscopy     cold cup bladder biopsy of five tumors, and fulguration of one tumor  . Lung surgery     Remote right lung  . Tubal ligation     Bilateral    PT Assessment/Plan/Recommendation PT Assessment Clinical Impression Statement: Pt with respiratory failure s/p VDRF with continued respiratory limitations with mobility. At rest pt 93% on 3L with mobility on 5L pt dropping to 85% and at rest end of transfer 89% on 5L with HR 111 during transfer. Pt currently limited by pulmonary status and will benefit from acute therapy to  maximize strength, mobility, ambulation and independence before discharge to decrease burden of care.  PT Recommendation/Assessment: Patient will need skilled PT in the acute care venue PT Problem List: Decreased mobility;Decreased activity tolerance;Cardiopulmonary status limiting activity;Decreased knowledge of use of DME;Decreased strength Barriers to Discharge: Decreased caregiver support Barriers to Discharge Comments: spouse avaible min assist 24 hrs PT Therapy Diagnosis : Difficulty walking;Generalized weakness PT Plan PT Frequency: Min 3X/week PT Treatment/Interventions: Gait training;DME instruction;Therapeutic activities;Therapeutic exercise;Stair training;Functional mobility training;Patient/family education PT Recommendation Recommendations for Other Services: OT consult Follow Up Recommendations: Skilled nursing facility vs Home health PT with Supervision for mobility/OOB pending pt respiratory status and improvement Equipment Recommended: Rolling walker with 5" wheels PT Goals  Acute Rehab PT Goals PT Goal Formulation: With patient Time For Goal Achievement: 2 weeks Pt will go Supine/Side to Sit: with supervision;with HOB 0 degrees PT Goal: Supine/Side to Sit - Progress: Goal set today Pt will go Sit to Supine/Side: with supervision PT Goal: Sit to Supine/Side - Progress: Goal set today Pt will go Sit to Stand: with supervision PT Goal: Sit to Stand - Progress: Goal set today Pt will go Stand to Sit: with supervision PT Goal: Stand to Sit - Progress: Goal set today Pt will Transfer Bed to Chair/Chair to Bed: with supervision PT Transfer Goal: Bed to Chair/Chair to Bed - Progress: Goal set today Pt will Ambulate: 51 - 150 feet;with min assist;with least restrictive assistive device PT Goal: Ambulate - Progress: Goal set today Pt will  Go Up / Down Stairs: 3-5 stairs;with min assist;with least restrictive assistive device PT Goal: Up/Down Stairs - Progress: Goal set today Pt  will Perform Home Exercise Program: with supervision, verbal cues required/provided PT Goal: Perform Home Exercise Program - Progress: Goal set today  PT Evaluation Precautions/Restrictions  Precautions Precautions: Fall Precaution Comments: O2 Prior Functioning  Home Living Lives With: Spouse Receives Help From: Other (Comment) (housekeeper for cleaning) Type of Home: Mobile home Home Layout: One level Home Access: Stairs to enter Entrance Stairs-Rails: Doctor, general practice of Steps: 5 Bathroom Shower/Tub: Forensic scientist: Standard Home Adaptive Equipment: None Prior Function Level of Independence: Independent with basic ADLs;Independent with transfers;Independent with gait;Needs assistance with homemaking Light Housekeeping: Moderate Driving: Yes Vocation: Retired Producer, television/film/video: Awake/alert Overall Cognitive Status: Appears within functional limits for tasks assessed Orientation Level: Oriented X4 Sensation/Coordination Sensation Light Touch: Appears Intact Extremity Assessment RLE Assessment RLE Assessment: Exceptions to [facility] RLE Strength RLE Overall Strength Comments: grossly 3/5 pt required encouragement to achieve full ROM LLE Assessment LLE Assessment: Exceptions to [facility] LLE Strength LLE Overall Strength Comments: grossly 3/5 pt required encouragement to achieve full ROM Mobility (including Balance) Bed Mobility Bed Mobility: Yes Supine to Sit: With rails;5: Supervision;HOB elevated (Comment degrees) Supine to Sit Details (indicate cue type and reason): HOB 30 degrees, cueing for sequence with increased time and supervision for lines Sitting - Scoot to Edge of Bed: 5: Supervision Sitting - Scoot to [place] of Bed Details (indicate cue type and reason): increased time and cueing for reciprocal scooting Transfers Transfers: Yes Sit to Stand: 3: Mod assist;From bed Sit to Stand Details (indicate cue type  and reason): cueing for hand placement and safety x 2 trials to achieve full standing secondary to difficulty with initial quad activation bilaterally Stand to Sit: 4: Min assist;To chair/3-in-1;To bed Stand to Sit Details: assist to control descent and positioning at surface Stand Pivot Transfers: 3: Mod assist Stand Pivot Transfer Details (indicate cue type and reason): bed to chair with recliner and cueing for hand placement and sequence of transfer Ambulation/Gait Ambulation/Gait: No Stairs: No  Posture/Postural Control Posture/Postural Control: Postural limitations Postural Limitations: pt maintaining kyphotic posture for use of accessory muscles Balance Balance Assessed: No Exercise    End of Session PT - End of Session Equipment Utilized During Treatment: Gait belt Activity Tolerance: Patient limited by fatigue Patient left: in chair;with call bell in reach Nurse Communication: Mobility status for transfers General Behavior During Session: [facility] for tasks performed Cognition: [facility] for tasks performed  Delorse Lek 05/23/2011, 9:37 AM  Delaney Meigs, PT [phone]

## 2011-05-23 NOTE — Progress Notes (Signed)
Asked by another IV Team RN to assess for peripheral iv site; pt has central line; pt stated "you only get 1 try at this; that's what I told the other girl;"  Both arms edematous, multiple large bruises noted bilaterally; no attempts made; RNs aware;

## 2011-05-23 NOTE — Plan of Care (Signed)
Problem: Phase II Progression Outcomes Goal: Date pt extubated/weaned off vent Outcome: Completed/Met Date Met:  05/23/11 05/22/2011 Goal: Time pt extubated/weaned off vent Outcome: Completed/Met Date Met:  05/23/11 09:20am

## 2011-05-23 NOTE — Consult Note (Signed)
Cardiology Consult Note   Patient ID: [name] [name] MRN: [id number], DOB/AGE: 70/03/1940   Admit date: 05/15/2011 Date of Consult: 05/23/2011  Primary Physician: Josue Hector, MD, MD Primary Cardiologist: Olga Millers, MD  Pt. Profile: Ms. [name] [name] is a 71yo Caucasian female with PMHx significant for CAD (s/p cardiac cath 02/12- subtotally occluded ostial LAD with R-L collaterals, nodular density in the prox LCx occupying 70% of the prox vessel, pHTN, and preserved LVEF; treated medically due to high risk), HTN, HL, PSVT/VT, AAA, COPD (oxygen-dependent, longstanding), bladder cancer, cerebrovascular and carotid artery disease who was admitted to [facility] on 05/15/11 for ventilator-dependent respiratory failure believed to be secondary to a COPD exacerbation.   Reason for consult: evaluation/management of SVT with positive troponins; risk stratification prior to discharge  Problem List: Past Medical History  Diagnosis Date  . Hypertension   . Hyperlipidemia   . Cerebrovascular disease, unspecified   . Unspecified glaucoma   . Obesity, unspecified   . COPD (chronic obstructive pulmonary disease)   . Coronary artery disease     cath 04/06/10: LAD occluded with R-L collats; med Rx WTC....probable V. Tach...med Rx  . Ventricular tachycardia   . SVT (supraventricular tachycardia)   . AAA (abdominal aortic aneurysm)     Past Surgical History  Procedure Date  . Cystoscopy   . Bladder tumor excision     resection of 5 bladder, and fulguration of 1 small bladder tumor  . Cystoscopy     cold cup bladder biopsy of five tumors, and fulguration of one tumor  . Lung surgery     Remote right lung  . Tubal ligation     Bilateral     Allergies: No Known Allergies  HPI:   Prior to this admission, she denies chest pain, lightheadedness, worsening DOE and palpitations. She is short of breath at baseline and denies a progressive worsening in her activity level aside from the  date of admission. She denies fevers, chills, sick contacts or extended travel. She denies orthopnea, PND or LE edema. She reports taking all medications as prescribed.   She was transported to [facility] ED and her respiratory status worsened in transit requiring multiple breathing treatments, steroids, BiPAP and eventual intubation with transient pressor support. Mild leukocytosis was noted on admission and she met SIRS criteria with resultant septic shock. She was covered for CAP. Bibasilar opacities were noted on consecutive CXRs, found to be more prominent and suspected to be effusions vs infiltrate. On day 1 of admission, she was noted to have positive troponin at 0.41 and 1.05 on 03/21-22. ASA/Plavix was continued. She was started on Lasix.   2D echo was performed revealing LVEF 65-70% with vigorous pumping, grade 1 diastolic dysfunction, mild LVH with concentric remodeling, ? Mild hypokinesis of apical septal myocardium, changes consistent with IVCD vs a fib with aberrancy vs PVCs, mild LA dilation.   She was found to be bradycardic and BB was held. She was diuresed with a successful negative output. She then converted to SVT s/p Adenosine IV x 2 and BB IV without conversion to NSR. Amiodarone bolus + infusion was started. She was then loaded with Digoxin with conversion to NSR. Amiodarone was then discontinued. Cardiac biomarkers drawn 03/26 were WNL. Respiratory status is improved today without distress, improved with steroids and diuresis. Patient is to be transferred to telemetry today. Per CCM, would like to risk stratify prior to discharge.   Home Medications: Prior to Admission medications   Medication Sig Start  Date End Date Taking? Authorizing Provider  acetaminophen-codeine (TYLENOL #3) 300-30 MG per tablet Take 1 tablet by mouth 3 (three) times daily.     Historical Provider, MD  aspirin 81 MG EC tablet Take 81 mg by mouth daily.      Historical Provider, MD  Aspirin-Acetaminophen 500-325 MG  PACK Take by mouth. (Goody Body Pain) as needed     Historical Provider, MD  atorvastatin (LIPITOR) 80 MG tablet Take 40 mg by mouth daily.     Historical Provider, MD  budesonide-formoterol (SYMBICORT) 80-4.5 MCG/ACT inhaler Inhale 2 puffs into the lungs 2 (two) times daily.      Historical Provider, MD  furosemide (LASIX) 20 MG tablet Take 1 tablet by mouth daily as needed. For swelling 09/29/10   Historical Provider, MD  ipratropium-albuterol (DUONEB) 0.5-2.5 (3) MG/3ML SOLN Take 3 mLs by nebulization daily.      Historical Provider, MD  metoprolol (TOPROL-XL) 50 MG 24 hr tablet TAKE 1/2 TABLET TWICE DAILY 10/23/10   Lewayne Bunting, MD  nitroGLYCERIN (NITROSTAT) 0.4 MG SL tablet Place 0.4 mg under the tongue every 5 (five) minutes as needed. Chest pain 07/10/10   Lewayne Bunting, MD  Oxygen-Helium 20-80 % KIT Inhale into the lungs. 2.5 Liters, use as needed with exertion     Historical Provider, MD  PLAVIX 75 MG tablet Take 1 tablet by mouth Daily. 10/08/10   Historical Provider, MD  potassium chloride SA (K-DUR,KLOR-CON) 20 MEQ tablet Take 20 mEq by mouth 2 (two) times daily.     Historical Provider, MD  travoprost, benzalkonium, (TRAVATAN) 0.004 % ophthalmic solution Place 1 drop into both eyes at bedtime.      Historical Provider, MD    Inpatient Medications:     . aspirin  324 mg Oral Once  . cefTRIAXone (ROCEPHIN)  IV  1 g Intravenous Q24H  . clonazePAM  0.5 mg Oral TID  . clopidogrel  75 mg Per Tube Daily  . digoxin  0.125 mg Intravenous Daily  . furosemide  20 mg Intravenous Q12H  . furosemide  40 mg Intravenous BID  . heparin  5,000 Units Subcutaneous Q8H  . hydrALAZINE  25 mg Oral Q8H  . insulin aspart  0-9 Units Subcutaneous TID WC  . ipratropium  0.5 mg Nebulization Q6H  . levalbuterol  0.63 mg Nebulization Q6H  . methylPREDNISolone (SOLU-MEDROL) injection  40 mg Intravenous Q12H  . pantoprazole  40 mg Oral Q1200  . potassium chloride  40 mEq Oral Once  . Travoprost (BAK  Free)  1 drop Both Eyes QHS  . DISCONTD: antiseptic oral rinse  15 mL Mouth Rinse QID  . DISCONTD: chlorhexidine  15 mL Mouth Rinse BID  . DISCONTD: feeding supplement (OSMOLITE 1.5 CAL)  1,000 mL Per Tube Q24H  . DISCONTD: feeding supplement  30 mL Per Tube TID WC  . DISCONTD: insulin aspart  0-3 Units Subcutaneous Q4H  . DISCONTD: levalbuterol  4 puff Inhalation Q6H  . DISCONTD: methylPREDNISolone (SOLU-MEDROL) injection  60 mg Intravenous Q12H  . DISCONTD: pantoprazole sodium  40 mg Per Tube QHS   Prescriptions prior to admission  Medication Sig Dispense Refill  . acetaminophen-codeine (TYLENOL #3) 300-30 MG per tablet Take 1 tablet by mouth 3 (three) times daily.       Marland Kitchen aspirin 81 MG EC tablet Take 81 mg by mouth daily.        . Aspirin-Acetaminophen 500-325 MG PACK Take by mouth. (Goody Body Pain) as needed       .  atorvastatin (LIPITOR) 80 MG tablet Take 40 mg by mouth daily.       . budesonide-formoterol (SYMBICORT) 80-4.5 MCG/ACT inhaler Inhale 2 puffs into the lungs 2 (two) times daily.        . furosemide (LASIX) 20 MG tablet Take 1 tablet by mouth daily as needed. For swelling      . ipratropium-albuterol (DUONEB) 0.5-2.5 (3) MG/3ML SOLN Take 3 mLs by nebulization daily.        . metoprolol (TOPROL-XL) 50 MG 24 hr tablet TAKE 1/2 TABLET TWICE DAILY  30 tablet  12  . nitroGLYCERIN (NITROSTAT) 0.4 MG SL tablet Place 0.4 mg under the tongue every 5 (five) minutes as needed. Chest pain      . Oxygen-Helium 20-80 % KIT Inhale into the lungs. 2.5 Liters, use as needed with exertion       . PLAVIX 75 MG tablet Take 1 tablet by mouth Daily.      . potassium chloride SA (K-DUR,KLOR-CON) 20 MEQ tablet Take 20 mEq by mouth 2 (two) times daily.       . travoprost, benzalkonium, (TRAVATAN) 0.004 % ophthalmic solution Place 1 drop into both eyes at bedtime.          Family History  Problem Relation Age of Onset  . Heart disease Mother 51  . Heart disease Father      History   Social  History  . Marital Status: Married    Spouse Name: N/A    Number of Children: 2  . Years of Education: N/A   Occupational History  . Retired     Social History Main Topics  . Smoking status: Former Smoker -- 0.3 packs/day for 58 years    Types: Cigarettes    Quit date: 05/18/2011  . Smokeless tobacco: Never Used  . Alcohol Use: No  . Drug Use: No  . Sexually Active: No   Other Topics Concern  . Not on file   Social History Narrative   Lives in [place], [place] with husband.      Review of Systems: General: negative for chills, fever, night sweats or weight changes.  Cardiovascular: positive for shortness of breath, DOE, negative for chest pain, edema, orthopnea, palpitations, paroxysmal nocturnal dyspnea Dermatological: negative for rash Respiratory: positive for wheezing and cough Urologic: negative for hematuria Abdominal: negative for nausea, vomiting, diarrhea, bright red blood per rectum, melena, or hematemesis Neurologic: negative for visual changes, syncope, or dizziness All other systems reviewed and are otherwise negative except as noted above.  Physical Exam: Blood pressure 156/92, pulse 89, temperature 98 F (36.7 C), temperature source Oral, resp. rate 19, height 5\' 9"  (1.753 m), weight 70.3 kg (154 lb 15.7 oz), SpO2 91.00%.    General: Elderly, in no acute distress. Head: Normocephalic, atraumatic, sclera non-icteric, no xanthomas Neck: Faint L carotid bruit appreciated, JVD not elevated Lungs: Distant breath sounds. Prolonged expirations. No appreciable wheezes, rales, or rhonchi. Breathing is somewhat labored. Heart: RRR/tachycardic with S1 S2. No murmurs, rubs, or gallops appreciated. Abdomen: Soft, non-tender, non-distended with normoactive bowel sounds. No hepatomegaly. No rebound/guarding. No obvious abdominal masses. Msk:  Strength and tone appears normal for age. Extremities: No clubbing, cyanosis or edema.  Distal pedal pulses are 2+ and equal  bilaterally. Neuro: Alert and oriented X 3. Moves all extremities spontaneously. Psych:  Responds to questions appropriately with a normal affect.  Labs: Recent Labs  Basename 05/22/11 0435 05/21/11 0415   WBC 9.1 6.1   HGB 13.1 12.4  HCT 40.8 39.0   MCV 102.0* 99.2   PLT 139* 141*    Lab 05/23/11 0531 05/22/11 0435 05/21/11 0415  NA 142 146* 144  K 3.5 3.3* 2.8*  CL 89* 94* 93*  CO2 >45* >45* >45*  BUN 23 28* 27*  CREATININE 0.47* 0.50 0.53  CALCIUM 9.2 8.6 8.7  PROT -- -- 5.2*  BILITOT -- -- 0.5  ALKPHOS -- -- 39  ALT -- -- 15  AST -- -- 14  AMYLASE -- -- --  LIPASE -- -- --  GLUCOSE 134* 162* 178*   Recent Labs  Basename 05/21/11 0414   CKTOTAL 62   CKMB 1.8   CKMBINDEX --   TROPONINI <0.30    Radiology/Studies: Dg Chest Port 1 View  05/23/2011  *RADIOLOGY REPORT*  Clinical Data: Hilar prominence.  PORTABLE CHEST - 1 VIEW  Comparison: 05/22/2011  Findings: There is hyperinflation of the lungs compatible with COPD.  Cardiomegaly.  Left base atelectasis or infiltrate.  Minimal right base atelectasis.  Mild bilateral hilar prominence likely is vascular, presumably pulmonary arterial hypertension.  No real change since prior study.  Interval removal of NG tube.  IMPRESSION: No significant change.  Original Report Authenticated By: Cyndie Chime, M.D.   Dg Chest Port 1 View  05/22/2011  *RADIOLOGY REPORT*  Clinical Data: Intubated.  PORTABLE CHEST - 1 VIEW  Comparison: 05/21/2011.  Findings: Endotracheal tube is in satisfactory position. Nasogastric tube is followed into the stomach.  Left IJ central line tip projects over the SVC, when patient rotation is considered and comparison is made with 05/21/2011.  Heart size stable.  Bibasilar air space disease, left greater than right, with probable bilateral pleural effusions.  IMPRESSION: Persistent bibasilar air space disease and probable small bilateral pleural effusions.  Original Report Authenticated By: Reyes Ivan,  M.D.   Dg Chest Port 1 View  05/21/2011  *RADIOLOGY REPORT*  Clinical Data: Evaluate endotracheal tube positioning  PORTABLE CHEST - 1 VIEW  Comparison: 05/20/2011; 05/19/2011; 05/18/2011  Findings:  Grossly unchanged borderline enlarged cardiac silhouette and mediastinal contours.  Stable position of support apparatus.  No pneumothorax.  Grossly unchanged blunting of bilateral costophrenic angles likely due to small bilateral effusions, right greater than left.  Grossly unchanged bilateral mid and lower lung heterogeneous air space opacities. Grossly unchanged bones.  IMPRESSION: 1.  Stable positioning of support apparatus.  No pneumothorax. 2.  Grossly unchanged small bilateral effusions and bilateral mid and lower lung heterogeneous opacities, atelectasis versus infiltrate.  Original Report Authenticated By: Waynard Reeds, M.D.   Dg Chest Port 1 View  05/20/2011  *RADIOLOGY REPORT*  Clinical Data: Evaluate endotracheal tube.  PORTABLE CHEST - 1 VIEW  Comparison: 05/19/2011  Findings: Endotracheal tube is 3.0 cm above the carina.  Stable position of the left jugular central line in the left innominate vein region.  Nasogastric tube extends into the abdomen.  There appears to be slightly improved aeration at the right lung base. Prominent interstitial markings particularly at the lung bases. Heart size is upper limits of normal.  No evidence for a pneumothorax. Focal density at the left lung base could represent atelectasis and/or small airspace disease.  IMPRESSION: Persistent basilar densities with prominent interstitial markings. Findings could represent dependent edema.  Cannot exclude small effusions.  Original Report Authenticated By: Richarda Overlie, M.D.   Dg Chest Port 1 View  05/19/2011  *RADIOLOGY REPORT*  Clinical Data: Evaluate endotracheal tube placement.  Hypertension. Hyperlipidemia.  COPD.  PORTABLE CHEST -  1 VIEW  Comparison: 1 day prior  Findings: Endotracheal tube unchanged, 4.7 cm above  carina. Nasogastric tube extends beyond the  inferior aspect of the film. Left IJ central line unchanged.  Mild cardiomegaly.  Costophrenic angles partially excluded. Probable small bilateral pleural effusions versus pleural thickening. No pneumothorax.  Hyperinflation.  Worsened right and similar left base air space disease.  IMPRESSION:  1.  Slight worsening in right and similar left base air space disease.  Favor atelectasis. 2.  Small bilateral pleural effusions are suspected. 3.  Stable support apparatus.  Original Report Authenticated By: Consuello Bossier, M.D.   Dg Chest Port 1 View  05/18/2011  *RADIOLOGY REPORT*  Clinical Data: Endotracheal tube placement.  PORTABLE CHEST - 1 VIEW  Comparison: 05/17/2011  Findings: Endotracheal tube is appropriately positioned, 4.5 cm above carina.  Left IJ central line tip at mid SVC.  Nasogastric tube extends beyond the  inferior aspect of the film.  Remote right rib trauma.  Underlying hyperinflation.  Support apparatus artifact projects over the left apex laterally.  Mild cardiomegaly.  Mild right hemidiaphragm elevation.  Trace right pleural fluid thickening. No pneumothorax.  Patchy bibasilar airspace disease.  Greater left than right.  IMPRESSION:  1.  Appropriate position of endotracheal tube. 2.  Similar right pleural fluid thickening with right hemidiaphragm elevation. 3.  Left greater than right bibasilar airspace disease, likely atelectasis.  Developing left base infection cannot be excluded.  Original Report Authenticated By: Consuello Bossier, M.D.   Dg Chest Port 1 View  05/17/2011  *RADIOLOGY REPORT*  Clinical Data: Evaluate endotracheal tube position  PORTABLE CHEST - 1 VIEW  Comparison: Portable chest x-ray of 05/15/2011  Findings: The tip of the endotracheal tube is very difficult to visualize on the current film, but is approximately 6.8 cm above the carina.  Right basilar opacity remains consistent with atelectasis, possible small effusion and  scarring.  Cardiomegaly is stable.  Left IJ central venous line remains.  IMPRESSION: The tip of endotracheal tube is difficult to visualize, approximately 6.8 cm above the carina.  Original Report Authenticated By: Juline Patch, M.D.   Dg Chest Portable 1 View  05/15/2011  *RADIOLOGY REPORT*  Clinical Data: Central line placement.  PORTABLE CHEST - 1 VIEW  Comparison: 05/15/2011.  Findings: Endotracheal tube is in satisfactory position.  Left IJ central line tip projects in the region of the brachiocephalic vein junction.  No definite pneumothorax.  Heart size stable.  Scarring in the right costophrenic angle.  Lungs are otherwise grossly clear.  IMPRESSION: Left IJ central line placement without pneumothorax.  No acute findings.  Original Report Authenticated By: Reyes Ivan, M.D.   Dg Chest Portable 1 View  05/15/2011  *RADIOLOGY REPORT*  Clinical Data: Respiratory stress  PORTABLE CHEST - 1 VIEW  Comparison: Chest radiograph 09/26/2010  Findings: Endotracheal tube is 6 cm from carina.  Stable cardiac silhouette.  There is mild basilar atelectasis similar to prior. No pneumothorax.  Central venous congestion is present.  IMPRESSION:   Endotracheal tube in good position.  Original Report Authenticated By: Genevive Bi, M.D.    EKG:  05/15/11: sinus tachycardia, 157 bpm, nonspecific ST-T wave changes 05/15/11: NSR, TWIs V1, V2, Q waves V1, V2 vs poor R wave progression  05/17/11: NSR, TWIs, V1, V2, V5, V6, I, aVL. Q waves V1, V2 vs poor R wave progression  ASSESSMENT AND PLAN:   1. PSVT- intermittent since admission. Asymptomatic with this. Started on Digoxin, maintaining NSR. VSS stable.  This has been noted in the past, and she had been maintaining NSR on this as well. This was held secondary to bradycardia this admission and in the setting of the patient's COPD exacerbation.  - Start Cardizem 120mg  daily  - Continue Digoxin   2. CAD- patient asymptomatic prior to admission and  during admission. Specifically denies chest pain, palpitations and worsening DOE TWIs noted shortly after admission with mild elevation in cardiac enzymes. This is likely in the setting of her acute respiratory failure secondary to CAP/ACOPD. This appears stable currently. Given patient's respiratory status, would still be high risk for PCI.   - Continue ASA/Plavix  - Add Imdur, restart statin   Signed, R. Hurman Horn, PA-C 05/23/2011, 3:24 PM   Patient seen and examined with Mr. Shaune Spittle.  History of CAD with subtotal LAD.  Medical management.  No recent cardiac symptoms.  She was admitted with exacerbation of her chronic lung disease. She has had SVT.  Her enzymes have been mildly elevated and there are new lateral T wave inversions.  However, now that she is extubated and recovering from her acute pulmonary problems she denies any recent chest pain.  She has had no palpitations or syncope.  Exam:  Lungs - decreased BS, COR - RRR distant heart sounds, Abd - Positive bowel sounds, no rebound no guarding,  Ext - no edema.  A/P  CAD  No further work up.  Medical management only.  No beta blocker per pulmonary.  We will add Cardizem CD and nitrates for treatment of CAD and SVT.  Continue ASA and Plavix.  Restart statin.  Fayrene Fearing Hochrein  05/23/2011  4:20 PM

## 2011-05-23 NOTE — Progress Notes (Signed)
Clinical Social Worker met with pt's spouse in waiting room.  CSW provided emotional support to spouse and provided opportunity for spouse to process difficult feelings.  Spouse stated that he was "thankful" for pt's recovery.  CSW to follow up with pt.   Angelia Mould, MSW, [place] [phone]

## 2011-05-23 NOTE — Progress Notes (Signed)
  Name: HERA CELAYA MRN: [id number] DOB: [birth year]    LOS: 8  [facility]  History of Present Illness:   72 YOWF COPD (oxygen dependant 2.5 liters), presented via EMS on 3/20 w/ CC:  not feeling well for 2-3 days was ambulatory upon EMS arrival, but patient's condition deteriorated in route. Upon arrival to the ED, she was nonverbal, and leaning forward, with expiratory wheezing. Patient was placed on BiPAP immediately. No reported fevers. No chest pains. Failed attempt at NIPPV and therefore intubated. She was given Solu-Medrol and multiple breathing treatments in route. Required pressors transiently post intubation.    Lines / Drains: OETT 3/20>>>3/22 ETT 3/24>>>3/27 Left IJ CVL 3/20>>>plan out 3/28  Cultures: Sputum 3/20>>>NF BCx2 3/20>>> UC 3/20>>> PCT 3/20>>> MRSA PCR>>>neg  Antibiotics: Rocephin 3/20>>>stop 28th azithro 3/20>>>3/25  Tests / Events: Echo 3/20>>> reintubated 3/24  SUBJECTIVE:  No svt, neg blance , weaned reasonable 3/26 Extubated 3/27  Vital Signs:  Filed Vitals:   05/23/11 0742 05/23/11 0800 05/23/11 0822 05/23/11 0912  BP:  156/70  165/98  Pulse:  82  111  Temp: 98.2 F (36.8 C)     TempSrc: Oral     Resp:  17    Height:      Weight:      SpO2:  92% 95% 89%     I/O last 3 completed shifts: In: 1963.9 [I.V.:1178.9; Other:100; NG/GT:535; IV Piggyback:150] Out: 5995 [Urine:5795; Stool:200]  Physical Examination: Gen: comfortable off vent, no distress, watching [employer] HEENT: jvd lower to wnl PULM: clear anterior, lll reduced CV: RRR, no mgr, no JVD AB: BS+, soft, nontender Ext: trace edema   Ventilator settings: Vent Mode:  [-]  FiO2 (%):  [5 %-50 %] 5 %  Labs and Imaging:  3/25 pcxr- no hcnages  Lab 05/23/11 0531 05/22/11 0435 05/21/11 0415  NA 142 146* 144  K 3.5 3.3* 2.8*  CL 89* 94* 93*  CO2 >45* >45* >45*  BUN 23 28* 27*  CREATININE 0.47* 0.50 0.53  GLUCOSE 134* 162* 178*    Lab 05/22/11 0435  05/21/11 0415 05/20/11 0430  HGB 13.1 12.4 13.4  HCT 40.8 39.0 41.8  WBC 9.1 6.1 9.0  PLT 139* 141* 161    Assessment and Plan:  Acute-on-chronic respiratory failure in setting of possible CAP (minimal infiltrate) and Acute exacerbation of chronic obstructive pulmonary disease (COPD): Plan: No distress, improved with neg balance Follow LLL effusionin future -better off metoprolol, avoid as outpt also solumedrol to pred in am  Reduce to 40 mg today  CAD - pos trops -asa/plavix -echo 55% , hint of some apical changes>? Will consult cards to risk stratify pre dc  Acute encephalopathy: multifactorial, resolved Plan: Anxiety, conitnue benzo Chair , mobilize, PT on going Chair position goal  Hyperglycemia Plan: -ssi To ac hs  Anxiety Plan: -schedule clonazepam 0.5 tid -PRN ativan  Ensure TF at goal, hold weaning ppi  R/o bronchitis Ceftriaxone completed  SVT Dig load resolved this Dig level in 3 days Tele supp k  Husband updated at bedside at length   Best practices / Disposition: -->ICU status under PCCM -->full code -->Heparin for DVT Px -->Protonix for GI Px -->diet: start tube feeding today -->family updated at bedside  To tele  Mcarthur Rossetti. Tyson Alias, MD, FACP Pgr: [phone] [facility]

## 2011-05-24 DIAGNOSIS — I498 Other specified cardiac arrhythmias: Secondary | ICD-10-CM

## 2011-05-24 DIAGNOSIS — I471 Supraventricular tachycardia: Secondary | ICD-10-CM | POA: Diagnosis not present

## 2011-05-24 LAB — PHOSPHORUS: Phosphorus: 5.1 mg/dL — ABNORMAL HIGH (ref 2.3–4.6)

## 2011-05-24 LAB — BASIC METABOLIC PANEL
BUN: 24 mg/dL — ABNORMAL HIGH (ref 6–23)
Chloride: 90 mEq/L — ABNORMAL LOW (ref 96–112)
GFR calc Af Amer: >90 mL/min (ref >90)
Glucose, Bld: 128 mg/dL — ABNORMAL HIGH (ref 70–99)
Potassium: 3.7 mEq/L (ref 3.5–5.1)
Sodium: 139 mEq/L (ref 135–145)

## 2011-05-24 LAB — GLUCOSE, CAPILLARY
Glucose-Capillary: 119 mg/dL — ABNORMAL HIGH (ref 70–99)
Glucose-Capillary: 117 mg/dL — ABNORMAL HIGH (ref 70–99)

## 2011-05-24 MED ORDER — SODIUM CHLORIDE 0.9 % IJ SOLN
10.0000 mL | INTRAMUSCULAR | Status: DC | PRN
  Administered 2011-05-24 (×2): 10 mL

## 2011-05-24 MED ORDER — ASPIRIN 81 MG PO CHEW
81.0000 mg | CHEWABLE_TABLET | Freq: Every day | ORAL | Status: DC
  Administered 2011-05-24 – 2011-06-04 (×12): 81 mg via ORAL
  Filled 2011-05-24 (×12): qty 1

## 2011-05-24 MED ORDER — DIGOXIN 250 MCG PO TABS
0.2500 mg | ORAL_TABLET | Freq: Every day | ORAL | Status: DC
  Administered 2011-05-25 – 2011-05-31 (×7): 0.25 mg via ORAL
  Filled 2011-05-24 (×8): qty 1

## 2011-05-24 NOTE — Progress Notes (Signed)
@   Subjective:  Denies CP; dyspnea improved   Objective:  Filed Vitals:   05/23/11 2000 05/23/11 2143 05/24/11 0000 05/24/11 0500  BP: 140/94  141/78 126/85  Pulse: 104  88 96  Temp: 97.9 F (36.6 C)  98 F (36.7 C) 97.3 F (36.3 C)  TempSrc: Oral  Oral Oral  Resp: 19  20 20   Height:      Weight:    154 lb (69.854 kg)  SpO2: 98% 94% 94% 94%    Intake/Output from previous day:  Intake/Output Summary (Last 24 hours) at 05/24/11 0753 Last data filed at 05/24/11 0700  Gross per 24 hour  Intake    202 ml  Output   1725 ml  Net  -1523 ml    Physical Exam: Physical exam: Well-developed frail in no acute distress.  Skin is warm and dry.  HEENT is normal.  Neck is supple.  Chest with diminished BS throughout Cardiovascular exam is regular rate and rhythm.  Abdominal exam nontender or distended. No masses palpated. Extremities show no edema. neuro grossly intact    Lab Results: Basic Metabolic Panel:  Basename 05/24/11 0603 05/23/11 0531  NA 139 142  K 3.7 3.5  CL 90* 89*  CO2 PENDING >45*  GLUCOSE 128* 134*  BUN 24* 23  CREATININE 0.49* 0.47*  CALCIUM 9.0 9.2  MG 2.1 2.1  PHOS 5.1* 4.2   CBC:  Basename 05/22/11 0435  WBC 9.1  NEUTROABS 8.0*  HGB 13.1  HCT 40.8  MCV 102.0*  PLT 139*     Assessment/Plan:  1) SVT - No episodes last PM; continue cardizem; no betablocker secondary to lung disease. 2) NSTEMI - Resume ASA; continue plavix and statin; medical therapy only; elevated enzymes most likely secondary to demand ischemia 3) COPD - management per pulmonary 4) Acute on chronic diastolic CHF- continue present dose of lasix. Olga Millers 05/24/2011, 7:53 AM

## 2011-05-24 NOTE — Progress Notes (Signed)
Speech Language Pathology Dysphagia Treatment  Patient Details Name: [name] [name] MRN: [id number] DOB: 07-22-1940 Today's Date: 05/24/2011  SLP Assessment/Plan/Recommendation Assessment / Recommendations / Plan Clinical Impression Statement: F/u after 3/27 clinical swallow eval.  Reviewed with pt etiology of aspiration risk associated with COPD exacerbation.  Discussed incoordination of breathing/swallowing; necessity of taking frequent breaks when consuming POs; allowing ample time to "catch her breath" before  drinking liquids.  Pt demonstrated improved understanding  of basic precautions; husband  supportive and encouraging. Tolerating regular diet with thin liquids.  Afebrile; lungs clear anteriorly. Continue with Current Diet: Regular;Thin liquid Liquids provided via: Cup Medication Administration: Whole meds with liquid Supervision: Patient able to self feed;Intermittent supervision to cue for compensatory strategies Compensations: Slow rate;Small sips/bites;Follow solids with liquid Postural Changes and/or Swallow Maneuvers: Seated upright 90 degrees;Upright 30-60 min after meal (respiratory breaks throughout meal) Oral Care Recommendations: Oral care BID Plan: All goals met Swallowing Goals  SLP Swallowing Goals Patient will consume recommended diet without observed clinical signs of aspiration with: Modified independent assistance Swallow Study Goal #1 - Progress: Met Patient will utilize recommended strategies during swallow to increase swallowing safety with: Modified independent assistance Swallow Study Goal #2 - Progress: Met  General Respiratory Status: Supplemental O2 delivered via (comment) (nasal cannula) Behavior/Cognition: Alert;Cooperative Oral Cavity - Dentition: Dentures, top;Dentures, bottom Patient Positioning: Upright in bed  Jamian Andujo L. Samson [place], [place] CCC/SLP Pager [phone]  Blenda Mounts Laurice 05/24/2011, 12:04 PM     .

## 2011-05-24 NOTE — Progress Notes (Signed)
Name: JEROME OTTER MRN: [id number] DOB: [birth year]    LOS: 9  [facility]  History of Present Illness:   53 YOWF COPD (oxygen dependant 2.5 liters), presented via EMS on 3/20 w/ CC:  not feeling well for 2-3 days was ambulatory upon EMS arrival, but patient's condition deteriorated in route. Upon arrival to the ED, she was nonverbal, and leaning forward, with expiratory wheezing. Patient was placed on BiPAP immediately. No reported fevers. No chest pains. Failed attempt at NIPPV and therefore intubated. She was given Solu-Medrol and multiple breathing treatments in route. Required pressors transiently post intubation.    Lines / Drains: OETT 3/20>>>3/22 ETT 3/24>>>3/27 Left IJ CVL 3/20>>>plan out 3/29  Cultures: Sputum 3/20>>>NF BCx2 3/20>>>neg UC 3/20>>>neg MRSA PCR>>>neg  Antibiotics: Rocephin 3/20>>>3/28 azithro 3/20>>>3/25  Tests / Events: Echo 3/20>>> EF 65-70%  LVH   SUBJECTIVE:  Weak, debilitated, needs PT/OT  Vital Signs:  Filed Vitals:   05/24/11 0000 05/24/11 0500 05/24/11 0815 05/24/11 0818  BP: 141/78 126/85  120/73  Pulse: 88 96    Temp: 98 F (36.7 C) 97.3 F (36.3 C)    TempSrc: Oral Oral    Resp: 20 20    Height:      Weight:  69.854 kg (154 lb)    SpO2: 94% 94% 95%      I/O last 3 completed shifts: In: 442 [I.V.:440; IV Piggyback:2] Out: 3725 [Urine:3525; Stool:200]  Physical Examination: Gen: no distress HEENT: jvd lower to wnl PULM: clear anterior, lll reduced CV: RRR, no mgr, no JVD AB: BS+, soft, nontender Ext: trace edema       Labs and Imaging:   Lab 05/24/11 0603 05/23/11 0531 05/22/11 0435  NA 139 142 146*  K 3.7 3.5 3.3*  CL 90* 89* 94*  CO2 41* >45* >45*  BUN 24* 23 28*  CREATININE 0.49* 0.47* 0.50  GLUCOSE 128* 134* 162*    Lab 05/22/11 0435 05/21/11 0415 05/20/11 0430  HGB 13.1 12.4 13.4  HCT 40.8 39.0 41.8  WBC 9.1 6.1 9.0  PLT 139* 141* 161   Dg Chest Port 1 View  05/23/2011   *RADIOLOGY REPORT*  Clinical Data: Hilar prominence.  PORTABLE CHEST - 1 VIEW  Comparison: 05/22/2011  Findings: There is hyperinflation of the lungs compatible with COPD.  Cardiomegaly.  Left base atelectasis or infiltrate.  Minimal right base atelectasis.  Mild bilateral hilar prominence likely is vascular, presumably pulmonary arterial hypertension.  No real change since prior study.  Interval removal of NG tube.  IMPRESSION: No significant change.  Original Report Authenticated By: Cyndie Chime, M.D.   Assessment and Plan:  Acute-on-chronic respiratory failure in setting of possible CAP (minimal infiltrate) and Acute exacerbation of chronic obstructive pulmonary disease (COPD): Plan: Change medrol to PO pred Cont BD neb meds,  Wean oxygen Follow effusion  CAD - pos trops -asa/plavix -echo 70-65%% apprec cards consult  Acute encephalopathy: multifactorial, resolved Plan: Anxiety, conitnue benzo Chair , mobilize, PT on going Chair position goal  Hyperglycemia Plan: -ssi To ac hs  Anxiety Plan: -schedule clonazepam 0.5 tid -PRN ativan  Ensure TF at goal, hold weaning ppi  R/o bronchitis Ceftriaxone completed  SVT Dig load resolved this Dig level in 3 days Tele supp k  Husband updated at bedside at length    Shan Levans I have seen and examined this patient with the nurse practionner and agree with the above assessment and plan.  I have made notations to the exceptions.  Beeper  [phone]  Cell  [phone]  If no response or cell goes to voicemail, call beeper [phone]

## 2011-05-24 NOTE — Evaluation (Signed)
Occupational Therapy Evaluation Patient Details Name: [name] [name] MRN: [id number] DOB: [birth year] Today's Date: 05/24/2011 14:15-14:43  evII Problem List:  Patient Active Problem List  Diagnoses  . HYPERLIPIDEMIA  . OBESITY  . TOBACCO ABUSE  . GLAUCOMA  . HYPERTENSION  . SUPRAVENTRICULAR TACHYCARDIA  . CAROTID ARTERY STENOSIS  . CEREBROVASCULAR DISEASE  . ABDOMINAL AORTIC ANEURYSM  . COPD  . DYSPNEA  . HYPOKALEMIA  . CAD  . VENTRICULAR TACHYCARDIA  . Acute-on-chronic respiratory failure  . Acute exacerbation of chronic obstructive pulmonary disease (COPD)  . CAP (community acquired pneumonia)  . SIRS (systemic inflammatory response syndrome)  . Septic shock  . Hyperglycemia  . Acute encephalopathy  . Protein calorie malnutrition  . CAD (coronary artery disease)  . SVT (supraventricular tachycardia)    Past Medical History:  Past Medical History  Diagnosis Date  . Hypertension   . Hyperlipidemia   . Cerebrovascular disease, unspecified   . Unspecified glaucoma   . Obesity, unspecified   . COPD (chronic obstructive pulmonary disease)   . Coronary artery disease     cath 04/06/10: LAD occluded with R-L collats; med Rx WTC....probable V. Tach...med Rx  . Ventricular tachycardia   . SVT (supraventricular tachycardia)   . AAA (abdominal aortic aneurysm)    Past Surgical History:  Past Surgical History  Procedure Date  . Cystoscopy   . Bladder tumor excision     resection of 5 bladder, and fulguration of 1 small bladder tumor  . Cystoscopy     cold cup bladder biopsy of five tumors, and fulguration of one tumor  . Lung surgery     Remote right lung  . Tubal ligation     Bilateral    OT Assessment/Plan/Recommendation OT Assessment Clinical Impression Statement: 71 yr old with history of COPD now admitted with respiratory distress and previously on ventilator.  Presents with severely limited endurance, strength, balance, and overall independence with basic  selfcare tasks.  Feell she will benefit from acute OT services to help increase independence.  Feel pt will benefit from follow-up SNF OT to help increase overall independence to a modified independent level.  If pt disagrees will need 24 hour supervision and home health OT. OT Recommendation/Assessment: Patient will need skilled OT in the acute care venue OT Problem List: Decreased strength;Decreased activity tolerance;Impaired balance (sitting and/or standing);Decreased knowledge of use of DME or AE;Cardiopulmonary status limiting activity OT Therapy Diagnosis : Generalized weakness OT Plan OT Frequency: Min 1X/week OT Treatment/Interventions: Self-care/ADL training;DME and/or AE instruction;Balance training;Patient/family education;Energy conservation;Therapeutic exercise;Therapeutic activities OT Recommendation Follow Up Recommendations: Skilled nursing facility Equipment Recommended: 3 in 1 bedside comode;Tub/shower bench Individuals Consulted Consulted and Agree with Results and Recommendations: Patient;Family member/caregiver Family Member Consulted: spouse OT Goals Acute Rehab OT Goals OT Goal Formulation: With patient/family Time For Goal Achievement: 2 weeks ADL Goals Pt Will Perform Grooming: with supervision;Standing at sink (2 tasks.) ADL Goal: Grooming - Progress: Goal set today Pt Will Perform Lower Body Bathing: with supervision;Sit to stand from bed ADL Goal: Lower Body Bathing - Progress: Goal set today Pt Will Perform Lower Body Dressing: with supervision;Sit to stand from bed ADL Goal: Lower Body Dressing - Progress: Goal set today Pt Will Transfer to Toilet: with supervision;with DME;3-in-1 ADL Goal: Toilet Transfer - Progress: Goal set today Pt Will Perform Toileting - Clothing Manipulation: with supervision;Sitting on 3-in-1 or toilet;Standing ADL Goal: Toileting - Clothing Manipulation - Progress: Goal set today Pt Will Perform Toileting - Hygiene: with  supervision;Sit to  stand from 3-in-1/toilet ADL Goal: Toileting - Hygiene - Progress: Goal set today Additional ADL Goal #1: Pt will perform bilateral UE light therex with supervision to help increase strength and endurance for greater independence with ADL's. ADL Goal: Additional Goal #1 - Progress: Goal set today Miscellaneous OT Goals Miscellaneous OT Goal #2: Pt will verablize at least 3 energy conservation strategies for selfcare tasks independently. OT Goal: Miscellaneous Goal #2 - Progress: Goal set today  OT Evaluation Precautions/Restrictions  Precautions Precautions: Fall Precaution Comments: O2 Required Braces or Orthoses: No Restrictions Weight Bearing Restrictions: No Prior Functioning Home Living Lives With: Spouse Receives Help From: Other (Comment) (housekeeper for cleaning) Type of Home: Mobile home Home Layout: One level Home Access: Stairs to enter Entrance Stairs-Rails: Doctor, general practice of Steps: 5 Bathroom Shower/Tub: Forensic scientist: Standard Home Adaptive Equipment: None Prior Function Level of Independence: Independent with basic ADLs;Independent with transfers;Independent with gait;Needs assistance with homemaking Light Housekeeping: Moderate Driving: Yes Vocation: Retired ADL ADL Eating/Feeding: Simulated;Set up Where Assessed - Eating/Feeding: Edge of bed Grooming: Simulated;Set up Where Assessed - Grooming: Sitting, bed;Unsupported Upper Body Bathing: Simulated;Set up Where Assessed - Upper Body Bathing: Unsupported;Sitting, bed Lower Body Bathing: Simulated;Moderate assistance Where Assessed - Lower Body Bathing: Sit to stand from bed Upper Body Dressing: Simulated;Set up Where Assessed - Upper Body Dressing: Unsupported;Sitting, bed Lower Body Dressing: Moderate assistance;Performed Where Assessed - Lower Body Dressing: Sit to stand from bed Toilet Transfer: Simulated;Maximal assistance Toilet  Transfer Method: Stand pivot Toilet Transfer Equipment: Other (comment) (Stepping up to the EOB, pt declined need to toilet.) Toileting - Clothing Manipulation: Moderate assistance;Simulated Toileting - Clothing Manipulation Details (indicate cue type and reason): sit to stand from EOB Where Assessed - Toileting Clothing Manipulation: Other (comment) (sit to stand from EOB) Toileting - Hygiene: Simulated;Moderate assistance Where Assessed - Toileting Hygiene: Other (comment) (sit to stand from EOB) Tub/Shower Transfer: Not assessed Tub/Shower Transfer Method: Not assessed Ambulation Related to ADLs: Pt only able to take 2 to 3 steps up at the EOB with mod assist.    ADL Comments: Pt with very limited endurance.  Would only agree to standing one time secondary to being extremely tired.  Required mod assist for sit to stand.  Vision/Perception  Vision - History Baseline Vision: No visual deficits Patient Visual Report: No change from baseline Perception Perception: Within Functional Limits Praxis Praxis: Intact Cognition Cognition Arousal/Alertness: Awake/alert Overall Cognitive Status: Appears within functional limits for tasks assessed Orientation Level: Oriented X4 Sensation/Coordination Sensation Light Touch: Appears Intact (In bilateral UEs) Stereognosis: Not tested Hot/Cold: Not tested Proprioception: Not tested Coordination Gross Motor Movements are Fluid and Coordinated: Yes Fine Motor Movements are Fluid and Coordinated: Yes Extremity Assessment RUE Assessment RUE Assessment:  (Strength 3+/5 throughout) LUE Assessment LUE Assessment:  (strength 3+/5 throughout.) Mobility  Bed Mobility Bed Mobility: Yes Supine to Sit: HOB elevated (Comment degrees);With rails;5: Supervision Supine to Sit Details (indicate cue type and reason): 35 degrees. Sitting - Scoot to Edge of Bed: 5: Supervision Transfers Transfers: Yes Sit to Stand: 3: Mod assist;From bed;With upper  extremity assist End of Session OT - End of Session Activity Tolerance: Patient limited by fatigue Patient left: in bed;with call bell in reach;with family/visitor present Nurse Communication: Mobility status for transfers General Behavior During Session: [facility] for tasks performed Cognition: [facility] for tasks performed   Anaiah Mcmannis OTR/L 05/24/2011, 4:15 PM  Pager number [phone]

## 2011-05-24 NOTE — Progress Notes (Signed)
Clinical Social Worker met with pt at bedside.  CSW introduced self and explained role.  When CSW began conversation regarding PT at home or another venue, pt stated, "I'M NOT GOING TO A NURSING HOME"  CSW provided opportunity for pt to process feelings.  Pt concluded with, I"ll do whatever I need to, but I'm not going to a nursing home".   CSW to continue to follow and assist with dc planning as needed.   Angelia Mould, MSW, [place] [phone]

## 2011-05-25 ENCOUNTER — Other Ambulatory Visit: Payer: Self-pay

## 2011-05-25 DIAGNOSIS — B370 Candidal stomatitis: Secondary | ICD-10-CM

## 2011-05-25 LAB — CBC
HCT: 42.6 % (ref 36.0–46.0)
Hemoglobin: 14 g/dL (ref 12.0–15.0)
MCH: 32.1 pg (ref 26.0–34.0)
MCHC: 32.9 g/dL (ref 30.0–36.0)
MCV: 97.7 fL (ref 78.0–100.0)
RBC: 4.36 MIL/uL (ref 3.87–5.11)

## 2011-05-25 LAB — DIFFERENTIAL
Basophils Relative: 0 % (ref 0–1)
Eosinophils Absolute: 0 10*3/uL (ref 0.0–0.7)
Eosinophils Relative: 0 % (ref 0–5)
Lymphs Abs: 0.8 10*3/uL (ref 0.7–4.0)
Monocytes Absolute: 0.6 10*3/uL (ref 0.1–1.0)
Neutro Abs: 14.5 10*3/uL — ABNORMAL HIGH (ref 1.7–7.7)
Neutrophils Relative %: 91 % — ABNORMAL HIGH (ref 43–77)

## 2011-05-25 LAB — GLUCOSE, CAPILLARY
Glucose-Capillary: 121 mg/dL — ABNORMAL HIGH (ref 70–99)
Glucose-Capillary: 164 mg/dL — ABNORMAL HIGH (ref 70–99)
Glucose-Capillary: 172 mg/dL — ABNORMAL HIGH (ref 70–99)
Glucose-Capillary: 101 mg/dL — ABNORMAL HIGH (ref 70–99)

## 2011-05-25 LAB — BASIC METABOLIC PANEL
BUN: 23 mg/dL (ref 6–23)
CO2: 39 mEq/L — ABNORMAL HIGH (ref 19–32)
GFR calc non Af Amer: >90 mL/min (ref >90)
Glucose, Bld: 189 mg/dL — ABNORMAL HIGH (ref 70–99)
Potassium: 3.5 mEq/L (ref 3.5–5.1)

## 2011-05-25 MED ORDER — MAGIC MOUTHWASH
5.0000 mL | Freq: Three times a day (TID) | ORAL | Status: AC
  Administered 2011-05-25 – 2011-05-27 (×9): 5 mL via ORAL
  Filled 2011-05-25 (×9): qty 5

## 2011-05-25 NOTE — Progress Notes (Signed)
HR sustaining in 150's, sao2 88 TC MD EKG done .

## 2011-05-25 NOTE — Progress Notes (Signed)
Pt. C/o h/a tylenol given. Hr remains in 150's EKG however, when preparing to call MD HR down to 103. Will continue to monitor

## 2011-05-25 NOTE — Progress Notes (Signed)
I was called by the patient's RN to evaluate Ms. Kratz in the setting of tachycardia. Per report, she has had progressive tachycardia this afternoon after mobilizing to the bedside commode.   On exam, she is speaking in full sentences and no in significant respiratory distress. RR 20 with very mild use of accessory muscles. Normotensive. No evident wheezing on lung auscultation. Extremities are warm, well-perfused, no edema.   ECG reveals narrow complex tachycardia (140s-150s) with what appears to be organized P-wave activity. She may be in rapid (2:1) atrial flutter.   Among the triggers for her tachycardia/SVT are her acute respiratory illness from which she is still recovering, stress of movement and transferring to commode. Getting her back in bed for the evening may help as much as anything else as she continues to recover from her COPD exacerbation. .   For now, would recommend that the primary team consider: - Trial of IV Diltiazem (10-15 mg IV once) with consideration of an IV infusion to permit HR control and adjustment in oral Diltiazem dose  - Continued treatment of her underlying stressors (COPD)  Please call with any questions or concerns overnight.  Zacarias Pontes Cardiology Fellow On-Call [phone]

## 2011-05-25 NOTE — Progress Notes (Signed)
TC pulmonology to update pt. Status. And that HR is now 90's

## 2011-05-25 NOTE — Progress Notes (Signed)
Up to [facility] HR 180's back to bed coaching done to breathe through nose sao2 87. Pt. Panicky and anxious Ativan given.

## 2011-05-25 NOTE — Progress Notes (Signed)
  Name: [name] [name] MRN: [id number] DOB: [birth year]    LOS: 10  [facility]  History of Present Illness:   59 YOWF COPD (oxygen dependant 2.5 liters), presented via EMS on 3/20 w/ CC:  not feeling well for 2-3 days was ambulatory upon EMS arrival, but patient's condition deteriorated in route. Upon arrival to the ED, she was nonverbal, and leaning forward, with expiratory wheezing. Patient was placed on BiPAP immediately. No reported fevers. No chest pains. Failed attempt at NIPPV and therefore intubated. She was given Solu-Medrol and multiple breathing treatments in route. Required pressors transiently post intubation.    Lines / Drains: OETT 3/20>>>3/22 ETT 3/24>>>3/27 Left IJ CVL 3/20>>>plan out 3/29  Cultures: Sputum 3/20>>>NF BCx2 3/20>>>neg UC 3/20>>>neg MRSA PCR>>>neg  Antibiotics: Rocephin 3/20>>>3/28 azithro 3/20>>>3/25  Tests / Events: Echo 3/20>>> EF 65-70%  LVH   SUBJECTIVE:   Remains very weak, just off bed pan. Nurse and husband here. Discussed mobilsation- up in chair/ PT/OT. Mouth feels blistered, thinks she is getting thrush. Vital Signs:  Filed Vitals:   05/24/11 1440 05/24/11 1452 05/24/11 2100 05/25/11 0500  BP:   145/74 123/65  Pulse: 115 88 87 86  Temp:   97.8 F (36.6 C) 97.5 F (36.4 C)  TempSrc:   Oral Oral  Resp:   20 18  Height:      Weight:    70.262 kg (154 lb 14.4 oz)  SpO2: 86% 96% 95% 93%     I/O last 3 completed shifts: In: -  Out: 600 [Urine:600]  Physical Examination: Gen: no distress. Weak. Nasal prong O2 5 L HEENT: No JVD. Mild hoarseness. Don't see definite thrush yet. PULM: clear anterior and posteriorly, lll reduced CV: RRR, no mgr,  AB: BS+, soft, nontender Ext: trace edema   Medications reviewed     Labs and Imaging:   Lab 05/25/11 0500 05/24/11 0603 05/23/11 0531  NA 137 139 142  K 3.5 3.7 3.5  CL 89* 90* 89*  CO2 39* 41* >45*  BUN 23 24* 23  CREATININE 0.46* 0.49* 0.47*  GLUCOSE  189* 128* 134*    Lab 05/25/11 0500 05/22/11 0435 05/21/11 0415  HGB 14.0 13.1 12.4  HCT 42.6 40.8 39.0  WBC 15.9* 9.1 6.1  PLT 154 139* 141*   No results found. Assessment and Plan:  Acute-on-chronic respiratory failure in setting of possible CAP (minimal infiltrate) and Acute exacerbation of chronic obstructive pulmonary disease (COPD): Plan: Change medrol to PO pred Cont BD neb meds,  Wean oxygen Follow effusion Plan-Will update CXR  CAD - pos trops -asa/plavix -echo 70-65%% apprec cards consult  Acute encephalopathy: multifactorial, resolved Plan: Anxiety, conitnue benzo Chair , mobilize, PT on going Chair position goal  Hyperglycemia Plan: -ssi To ac hs  Anxiety Plan: -schedule clonazepam 0.5 tid -PRN ativan  Thrush- mild.  Plan-Will start magic mouthwash     ppi  R/o bronchitis Ceftriaxone completed  SVT Dig load resolved this Dig level in 3 days Tele supp Nelda Marseille D

## 2011-05-25 NOTE — Progress Notes (Signed)
Patient ID: [name] [name], female   DOB: 07-18-1940, 71 y.o.   MRN: [id number] Chart and telemetry reviewed. No SVT. Renal function stable. No new recommendations. Will follow.

## 2011-05-25 NOTE — Progress Notes (Signed)
Called to bedside to evaluate patient for "not moving much air" per RN.  When I arrived, RN told me that pt's heart rate significantly improved from 150s.  HR is now in 80s.  With this HR improvement, pt's sx also mostly resolved.  S: Pt tells me that earlier she was feeling SOB, anxious, & had a sensation that her throat was closing.  She says this lasted for 15-84min & that she was given a medicine for anxiety & this helped her significantly.  She says that her sx are easing.    O:   Gen: Pt was initially talking on the phone when I arrived.  She was able to complete sentences without taking a breath halfway through.  She is on nasal cannula. Pulm: there is minimal wheezing bilaterally, she doesn't appear to be in resp distress CV: RRR, no MRG (I measured HR 88).    Her last nebulizer (levalbuterol) treatment was 341pm.  Her last Klonopin was 422pm. 330pm EKG: HR 155, regular, narow  A: SOB that appears to have resolved with rate control P:  -despite mild wheezing heard on exam, it appears her sx predominantly originate from AF/RVR given rapid resolution, thus would hold off on increasing frequency of nebulizers at this time -she is rate-controlled at this time (digoxin, dilt 120 SR): if SOB recurs a/w RVR, she might benefit from additional short-acting diltiazem

## 2011-05-26 ENCOUNTER — Inpatient Hospital Stay (HOSPITAL_COMMUNITY): Payer: Medicare Other

## 2011-05-26 DIAGNOSIS — R0602 Shortness of breath: Secondary | ICD-10-CM

## 2011-05-26 LAB — URINALYSIS, ROUTINE W REFLEX MICROSCOPIC
Glucose, UA: NEGATIVE mg/dL
Hgb urine dipstick: NEGATIVE
Leukocytes, UA: NEGATIVE
Specific Gravity, Urine: 1.011 (ref 1.005–1.030)

## 2011-05-26 LAB — GLUCOSE, CAPILLARY: Glucose-Capillary: 137 mg/dL — ABNORMAL HIGH (ref 70–99)

## 2011-05-26 MED ORDER — PREDNISONE 20 MG PO TABS
40.0000 mg | ORAL_TABLET | Freq: Every day | ORAL | Status: DC
  Administered 2011-05-27: 40 mg via ORAL
  Filled 2011-05-26 (×3): qty 2

## 2011-05-26 MED ORDER — DILTIAZEM HCL ER COATED BEADS 180 MG PO CP24
180.0000 mg | ORAL_CAPSULE | Freq: Every day | ORAL | Status: DC
  Administered 2011-05-26 – 2011-05-28 (×3): 180 mg via ORAL
  Filled 2011-05-26 (×4): qty 1

## 2011-05-26 NOTE — Progress Notes (Signed)
  Name: NATAUSHA JUNGWIRTH MRN: [id number] DOB: 15-May-1940    LOS: 11  [facility]  History of Present Illness:   49 YOWF COPD (oxygen dependant 2.5 liters), presented via EMS on 3/20 w/ CC:  not feeling well for 2-3 days was ambulatory upon EMS arrival, but patient's condition deteriorated in route. Upon arrival to the ED, she was nonverbal, and leaning forward, with expiratory wheezing. Patient was placed on BiPAP immediately. No reported fevers. No chest pains. Failed attempt at NIPPV and therefore intubated. She was given Solu-Medrol and multiple breathing treatments in route. Required pressors transiently post intubation.    Lines / Drains: OETT 3/20>>>3/22 ETT 3/24>>>3/27 Left IJ CVL 3/20>>>plan out 3/29  Cultures: Sputum 3/20>>>NF BCx2 3/20>>>neg UC 3/20>>>neg MRSA PCR>>>neg  Antibiotics: Rocephin 3/20>>>3/28 azithro 3/20>>>3/25  Tests / Events: Echo 3/20>>> EF 65-70%  LVH   SUBJECTIVE: 2 Episodes of SVT yesterday. Seen by both Cardiology and PCCM Fellows- notes reviewed and discussed with nurse. Not clear on talking with patient whether triggers were exertion/ bedside commode, "upset" per patient. Now she says she feels well. Getting bed bath. Vital Signs:  Filed Vitals:   05/25/11 1024 05/25/11 1400 05/25/11 2100 05/26/11 0500  BP:  122/61 131/73 109/65  Pulse:  118 98 65  Temp:  98.5 F (36.9 C) 98.3 F (36.8 C) 97.7 F (36.5 C)  TempSrc:  Oral Oral Oral  Resp:  24 18 18   Height:      Weight:    73.4 kg (161 lb 13.1 oz)  SpO2: 93% 95% 96% 95%     I/O last 3 completed shifts: In: 1040 [P.O.:1040] Out: -   Physical Examination: Gen: no distress. Weak. Nasal prong O2 5 L HEENT: No JVD. Mild hoarseness. Don't see definite thrush yet. PULM: clear anterior and posteriorly, unlabored CV: RRR, no mgr,  AB: BS+, soft, nontender Ext: No edema   Medications reviewed     Labs and Imaging:   Lab 05/25/11 0500 05/24/11 0603 05/23/11 0531  NA  137 139 142  K 3.5 3.7 3.5  CL 89* 90* 89*  CO2 39* 41* >45*  BUN 23 24* 23  CREATININE 0.46* 0.49* 0.47*  GLUCOSE 189* 128* 134*    Lab 05/25/11 0500 05/22/11 0435 05/21/11 0415  HGB 14.0 13.1 12.4  HCT 42.6 40.8 39.0  WBC 15.9* 9.1 6.1  PLT 154 139* 141*   No results found. Assessment and Plan:  Acute-on-chronic respiratory failure in setting of possible CAP (minimal infiltrate) and Acute exacerbation of chronic obstructive pulmonary disease (COPD): Plan: Changed medrol to PO pred Cont BD neb meds,  Wean oxygen Follow effusion Plan-Will update CXR  CAD - pos trops -asa/plavix -echo 70-65%% apprec cards consult  SVT- EKGs reviewed- in paper file. Plan- Increased diltiazem po to 180 mg. Watch BP. Check Dig level  Acute encephalopathy: multifactorial, resolved Plan: Anxiety, conitnue benzo Chair , mobilize, PT on going Chair position goal  Hyperglycemia Plan: -ssi To ac hs  Anxiety Plan: -schedule clonazepam 0.5 tid -PRN ativan  Thrush- mild.  Plan-Will start magic mouthwash     ppi  R/o bronchitis Ceftriaxone completed  SVT Dig load resolved this Dig level in 3 days Tele supp Nelda Marseille D

## 2011-05-26 NOTE — Progress Notes (Signed)
Pt.a c/o ua frequency and pain and difficulty urinating. Urinating in small frequent amounts on bedpan. Bladder scanned for >999 ua. TC MD new orders noted. I & O cath for 1330 ml clear yellow ua without odor. Will cont to monitor  Specimen sent to lab as ordered. Pt. With noted relief and decreased c/o

## 2011-05-27 DIAGNOSIS — J9600 Acute respiratory failure, unspecified whether with hypoxia or hypercapnia: Secondary | ICD-10-CM

## 2011-05-27 LAB — BASIC METABOLIC PANEL
CO2: 40 mEq/L (ref 19–32)
Calcium: 8.5 mg/dL (ref 8.4–10.5)
GFR calc non Af Amer: >90 mL/min (ref >90)
Glucose, Bld: 103 mg/dL — ABNORMAL HIGH (ref 70–99)
Potassium: 4.1 mEq/L (ref 3.5–5.1)
Sodium: 141 mEq/L (ref 135–145)

## 2011-05-27 LAB — GLUCOSE, CAPILLARY
Glucose-Capillary: 92 mg/dL (ref 70–99)
Glucose-Capillary: 112 mg/dL — ABNORMAL HIGH (ref 70–99)
Glucose-Capillary: 107 mg/dL — ABNORMAL HIGH (ref 70–99)

## 2011-05-27 MED ORDER — GLUCERNA SHAKE PO LIQD
237.0000 mL | Freq: Two times a day (BID) | ORAL | Status: DC
  Administered 2011-05-27 – 2011-05-30 (×5): 237 mL via ORAL

## 2011-05-27 MED ORDER — GUAIFENESIN ER 600 MG PO TB12
1200.0000 mg | ORAL_TABLET | Freq: Two times a day (BID) | ORAL | Status: DC
  Administered 2011-05-27 (×2): 1200 mg via ORAL
  Administered 2011-05-28: 600 mg via ORAL
  Filled 2011-05-27 (×4): qty 2

## 2011-05-27 NOTE — Progress Notes (Signed)
Pt complained of anxiety and sob heart rate was 150's rn went to get iv ativan for patient and heart rate decreased to 90's pt bp=124/66 and o2 sat was 91% on 4 L O2 [place]. Dr. Molli Knock notified no new orders received will continue to monitor patient.

## 2011-05-27 NOTE — Consult Note (Signed)
Consult Note from the Palliative Medicine Team at [facility] Patient [name] [name]      DOB: [birth year]      NWG:[id number]   Consult Requested by: Dr Molli Knock     PCP: Josue Hector, MD, MD Reason for Consultation:clarification of GOC and options     Phone Number:[phone]   This NP Lorinda Creed reviewed medical records, received report from team, assessed the patient and then meet at the patient's bedside along with her husband Mr Kilbride  to discuss diagnosis prognosis, GOC, EOL wishes disposition and options.   A detailed discussion was had today regarding advanced directives.  Concepts specific to code status, artifical feeding and hydration, continued IV antibiotics and rehospitalization was had.  The difference between a aggressive medical intervention path  and a palliative comfort care path for this patient at this time was had.  Values and goals of care important to patient and family were attempted to be elicited.  Concept of Hospice and Palliative Care were discussed     Assessment and Plan: 1. Code Status:Full code--patient hopeful for improvement and want continued aggressive care,    -Strongly recommended both patient and husband discuss their personal advanced directives with each other and document.   2. Hopeful SNF for rehab in [place] at [facility]    -will write for social work consult   Brief HPI: admitted with resp failure intubated, with CAP COPD, remains weak and decreased functional status   ROS: prior to admission per family, on home o2 but fully functioning and driving    PMH:  Past Medical History  Diagnosis Date  . Hypertension   . Hyperlipidemia   . Cerebrovascular disease, unspecified   . Unspecified glaucoma   . Obesity, unspecified   . COPD (chronic obstructive pulmonary disease)   . Coronary artery disease     cath 04/06/10: LAD occluded with R-L collats; med Rx WTC....probable V. Tach...med Rx  . Ventricular tachycardia   . SVT  (supraventricular tachycardia)   . AAA (abdominal aortic aneurysm)      PSH: Past Surgical History  Procedure Date  . Cystoscopy   . Bladder tumor excision     resection of 5 bladder, and fulguration of 1 small bladder tumor  . Cystoscopy     cold cup bladder biopsy of five tumors, and fulguration of one tumor  . Lung surgery     Remote right lung  . Tubal ligation     Bilateral   I have reviewed the FH and SH and  If appropriate update it with new information. No Known Allergies Scheduled Meds:   . aspirin  81 mg Oral Daily  . atorvastatin  40 mg Oral q1800  . clonazePAM  0.5 mg Oral TID  . clopidogrel  75 mg Per Tube Daily  . digoxin  0.25 mg Oral Daily  . diltiazem  180 mg Oral Daily  . feeding supplement  237 mL Oral BID BM  . guaiFENesin  1,200 mg Oral BID  . hydrALAZINE  25 mg Oral Q8H  . insulin aspart  0-9 Units Subcutaneous TID WC  . ipratropium  0.5 mg Nebulization Q6H  . isosorbide mononitrate  30 mg Oral Daily  . levalbuterol  0.63 mg Nebulization Q6H  . magic mouthwash  5 mL Oral TID  . pantoprazole  40 mg Oral Q1200  . predniSONE  40 mg Oral Q breakfast  . Travoprost (BAK Free)  1 drop Both Eyes QHS  . DISCONTD: heparin  5,000 Units Subcutaneous Q8H   Continuous Infusions:  PRN Meds:.acetaminophen, LORazepam, ondansetron (ZOFRAN) IV    BP 124/66  Pulse 89  Temp(Src) 98 F (36.7 C) (Oral)  Resp 20  Ht 5\' 9"  (1.753 m)  Wt 73 kg (160 lb 15 oz)  BMI 23.77 kg/m2  SpO2 91%   PPS:30%   Intake/Output Summary (Last 24 hours) at 05/27/11 1705 Last data filed at 05/27/11 1500  Gross per 24 hour  Intake    480 ml  Output    901 ml  Net   -421 ml     Physical Exam:  General: weak and frail, sob at rest HEENT:  Dry mucous membranes Chest:   Distant BS, CTA CVS: RRR Abdomen:soft NT +BS Ext: without edema Neuro:alert and oriented X3  Labs: CBC    Component Value Date/Time   WBC 15.9* 05/25/2011 0500   RBC 4.36 05/25/2011 0500   HGB 14.0  05/25/2011 0500   HCT 42.6 05/25/2011 0500   PLT 154 05/25/2011 0500   MCV 97.7 05/25/2011 0500   MCH 32.1 05/25/2011 0500   MCHC 32.9 05/25/2011 0500   RDW 12.8 05/25/2011 0500   LYMPHSABS 0.8 05/25/2011 0500   MONOABS 0.6 05/25/2011 0500   EOSABS 0.0 05/25/2011 0500   BASOSABS 0.0 05/25/2011 0500    BMET    Component Value Date/Time   NA 141 05/27/2011 0540   K 4.1 05/27/2011 0540   CL 97 05/27/2011 0540   CO2 40* 05/27/2011 0540   GLUCOSE 103* 05/27/2011 0540   BUN 20 05/27/2011 0540   CREATININE 0.46* 05/27/2011 0540   CALCIUM 8.5 05/27/2011 0540   GFRNONAA >90 05/27/2011 0540   GFRAA >90 05/27/2011 0540    CMP     Component Value Date/Time   NA 141 05/27/2011 0540   K 4.1 05/27/2011 0540   CL 97 05/27/2011 0540   CO2 40* 05/27/2011 0540   GLUCOSE 103* 05/27/2011 0540   BUN 20 05/27/2011 0540   CREATININE 0.46* 05/27/2011 0540   CALCIUM 8.5 05/27/2011 0540   PROT 5.2* 05/21/2011 0415   ALBUMIN 3.0* 05/21/2011 0415   AST 14 05/21/2011 0415   ALT 15 05/21/2011 0415   ALKPHOS 39 05/21/2011 0415   BILITOT 0.5 05/21/2011 0415   GFRNONAA >90 05/27/2011 0540   GFRAA >90 05/27/2011 0540       Time In Time Out Total Time Spent with Patient Total Overall Time  1600 1715 60 75    Greater than 50%  of this time was spent counseling and coordinating care related to the above assessment and plan.   Lorinda Creed NP  [phone]

## 2011-05-27 NOTE — Progress Notes (Signed)
PT Cancellation Note  Treatment cancelled today due to patient's refusal to participate.  Pt notes she doesn't feel like getting OOB right now and she will do it on her own when she wants to.  Pt ed on calling for A and not trying on her own.  Will try another time.     Sunny Schlein, [place] [phone] 05/27/2011, 8:34 AM

## 2011-05-27 NOTE — Progress Notes (Signed)
Name: [name] [name] MRN: [id number] DOB: 1940-03-24    LOS: 12  [facility]  History of Present Illness:   48 YOWF COPD (oxygen dependant 2.5 liters), presented via EMS on 3/20 w/ CC:  not feeling well for 2-3 days was ambulatory upon EMS arrival, but patient's condition deteriorated in route. Upon arrival to the ED, she was nonverbal, and leaning forward, with expiratory wheezing. Patient was placed on BiPAP immediately. No reported fevers. No chest pains. Failed attempt at NIPPV and therefore intubated. She was given Solu-Medrol and multiple breathing treatments in route. Required pressors transiently post intubation.    Lines / Drains: OETT 3/20>>>3/22 ETT 3/24>>>3/27 Left IJ CVL 3/20>>>plan out 3/29  Cultures: Sputum 3/20>>>NF BCx2 3/20>>>neg UC 3/20>>>neg MRSA PCR>>>neg  Antibiotics: Rocephin 3/20>>>3/28 azithro 3/20>>>3/25  Tests / Events: Echo 3/20>>> EF 65-70%  LVH   SUBJECTIVE: Pt still with runs of SVT, pt remains very weak. She wants to go home. She is willing to consider palliative care medicine.  She is refusing SNF placement.  Vital Signs:  Filed Vitals:   05/27/11 1003 05/27/11 1018 05/27/11 1030 05/27/11 1359  BP:    124/66  Pulse: 90 170 89   Temp:      TempSrc:      Resp:      Height:      Weight:      SpO2:  90%  91%     I/O last 3 completed shifts: In: 480 [P.O.:480] Out: 500 [Urine:500]  Physical Examination: Gen: no distress. Weak. Nasal prong O2 5 L HEENT: No JVD. Mild hoarseness. Don't see definite thrush yet. PULM: distant BS CV: RRR, no mgr,  AB: BS+, soft, nontender Ext: No edema   Medications reviewed     Labs and Imaging:   Lab 05/27/11 0540 05/25/11 0500 05/24/11 0603  NA 141 137 139  K 4.1 3.5 3.7  CL 97 89* 90*  CO2 40* 39* 41*  BUN 20 23 24*  CREATININE 0.46* 0.46* 0.49*  GLUCOSE 103* 189* 128*    Lab 05/25/11 0500 05/22/11 0435 05/21/11 0415  HGB 14.0 13.1 12.4  HCT 42.6 40.8 39.0  WBC  15.9* 9.1 6.1  PLT 154 139* 141*   Dg Chest Port 1 View  05/26/2011  *RADIOLOGY REPORT*  Clinical Data: COPD and leukocytosis  PORTABLE CHEST - 1 VIEW  Comparison: 05/23/2011  Findings: Heart size appears normal.  There are low lung volumes.  Coarsened interstitial markings are noted bilaterally.  There is been slight interval improvement in aeration to the left base.  No change in right base opacities.  IMPRESSION:  1.  Mild improvement in aeration to the right lung base.  Original Report Authenticated By: Rosealee Albee, M.D.   Assessment and Plan:  Acute-on-chronic respiratory failure in setting of possible CAP (minimal infiltrate) and Acute exacerbation of chronic obstructive pulmonary disease (COPD): Plan: Cont  PO pred Cont BD neb meds,  Wean oxygen Consult Palliative Care Medicine  CAD - pos trops -asa/plavix -echo 70-65%% apprec cards consult  SVT- EKGs reviewed- in paper file. Plan- Increased diltiazem po to 180 mg. Watch BP. Check Dig level  Acute encephalopathy: multifactorial, resolved Plan: Anxiety, conitnue benzo Chair , mobilize, PT on going Chair position goal  Hyperglycemia Plan: -ssi To ac hs  Anxiety Plan: -schedule clonazepam 0.5 tid -PRN ativan  Thrush- mild.  Plan-Will cont magic mouthwash    GERD ppi  R/o bronchitis Ceftriaxone completed  SVT Per cardiology Dig loaded, level pending  Shan Levans Beeper  [phone]  Cell  [phone]  If no response or cell goes to voicemail, call beeper [phone]

## 2011-05-27 NOTE — Plan of Care (Signed)
Problem: Phase I Progression Outcomes Goal: Progress activity as tolerated unless otherwise ordered Outcome: Not Progressing Due DOE and anxiety. Ignacia Palma, [place] [phone] 05/27/2011

## 2011-05-27 NOTE — Progress Notes (Signed)
Occupational Therapy Treatment Patient Details Name: [name] [name] MRN: [id number] DOB: [birth year] Today's Date: 05/27/2011  OT Assessment/Plan OT Assessment/Plan Comments on Treatment Session: This 71 yo female making slow progress given DOE and anxiety issues. Feel best option would be SNF. OT Plan: Discharge plan remains appropriate OT Frequency: Min 1X/week Follow Up Recommendations: Skilled nursing facility Equipment Recommended: 3 in 1 bedside comode;Tub/shower bench OT Goals Acute Rehab OT Goals OT Goal Formulation: With patient Time For Goal Achievement: 2 weeks ADL Goals ADL Goal: Toilet Transfer - Progress: Progressing toward goals ADL Goal: Toileting - Clothing Manipulation - Progress: Not progressing ADL Goal: Toileting - Hygiene - Progress: Not progressing  OT Treatment Precautions/Restrictions  Precautions Precautions: Fall Restrictions Weight Bearing Restrictions: No   ADL ADL Toilet Transfer: Minimal assistance;Performed Statistician Method: Surveyor, minerals: Set designer - Clothing Manipulation: Performed;+1 Total assistance Where Assessed - Glass blower/designer Manipulation: Standing Toileting - Hygiene: Performed;+1 Total assistance Where Assessed - Toileting Hygiene: Standing ADL Comments: Pt greatly limited by breathing taxation which causes her to become anxious. Educated pt on pursed lipped breathing Mobility  Bed Mobility Bed Mobility: Yes Left Sidelying to Sit: 4: Min assist;HOB elevated (comment degrees);With rails (30 degrees) Sitting - Scoot to Edge of Bed: 5: Supervision;With rail Sit to Sidelying Left: 4: Min assist;HOB flat;With rail (fo rlegs) Transfers Transfers: Yes Sit to Stand: 4: Min assist;With upper extremity assist;From bed Stand to Sit: 4: Min assist;With upper extremity assist;With armrests;To chair/3-in-1 Exercises    End of Session OT - End of Session Equipment Utilized During Treatment:   (3-n-1) Activity Tolerance: Patient limited by fatigue;Other (comment) (DOE, anxiety) Patient left: in bed;with call bell in reach;with bed alarm set General Behavior During Session:  (anxius) Cognition: [facility] for tasks performed  Evette Georges [phone] 05/27/2011, 10:57 AM

## 2011-05-27 NOTE — Progress Notes (Signed)
Nutrition Follow-up  Pt extubated 3/27. EN discontinued. S/p bedside swallow evaluation 3/27. PO intake variable at 25-100% per flowsheet records. Pt reports a poor appetite due to oral thrush. States she's been receiving Ensure supplements during her hospitalization.  Diet Order:  Heart Healthy  Meds: Scheduled Meds:   . aspirin  81 mg Oral Daily  . atorvastatin  40 mg Oral q1800  . clonazePAM  0.5 mg Oral TID  . clopidogrel  75 mg Per Tube Daily  . digoxin  0.25 mg Oral Daily  . diltiazem  180 mg Oral Daily  . heparin  5,000 Units Subcutaneous Q8H  . hydrALAZINE  25 mg Oral Q8H  . insulin aspart  0-9 Units Subcutaneous TID WC  . ipratropium  0.5 mg Nebulization Q6H  . isosorbide mononitrate  30 mg Oral Daily  . levalbuterol  0.63 mg Nebulization Q6H  . magic mouthwash  5 mL Oral TID  . pantoprazole  40 mg Oral Q1200  . predniSONE  40 mg Oral Q breakfast  . Travoprost (BAK Free)  1 drop Both Eyes QHS  . DISCONTD: diltiazem  120 mg Oral Daily  . DISCONTD: methylPREDNISolone (SOLU-MEDROL) injection  40 mg Intravenous Q12H   Continuous Infusions:  PRN Meds:.acetaminophen, LORazepam, ondansetron (ZOFRAN) IV  Labs:  CMP     Component Value Date/Time   NA 141 05/27/2011 0540   K 4.1 05/27/2011 0540   CL 97 05/27/2011 0540   CO2 40* 05/27/2011 0540   GLUCOSE 103* 05/27/2011 0540   BUN 20 05/27/2011 0540   CREATININE 0.46* 05/27/2011 0540   CALCIUM 8.5 05/27/2011 0540   PROT 5.2* 05/21/2011 0415   ALBUMIN 3.0* 05/21/2011 0415   AST 14 05/21/2011 0415   ALT 15 05/21/2011 0415   ALKPHOS 39 05/21/2011 0415   BILITOT 0.5 05/21/2011 0415   GFRNONAA >90 05/27/2011 0540   GFRAA >90 05/27/2011 0540     Intake/Output Summary (Last 24 hours) at 05/27/11 0908 Last data filed at 05/27/11 0500  Gross per 24 hour  Intake    360 ml  Output    500 ml  Net   -140 ml    CBG (last 3)   Basename 05/27/11 0726 05/26/11 2118 05/26/11 0730  GLUCAP 92 137* 114*    Weight Status:  73 kg (4/1) --  stable  Re-estimated needs:  1800-2000 kcals, 90-100 gm protein  Nutrition Dx:  Inadequate Oral Intake now r/t decreased appetite as evidenced by PO intake 25-100%  New Goal: Oral intake to meet >90% of estimated nutrition needs, unmet Monitor: PO intake, labs, weight, I/O's  Intervention:    Add Glucerna Shake PO BID (220 kcals, 9.9 gm protein per 8 fl oz bottle)  RD to follow for nutrition care plan  Alger Memos Pager #:  [phone]

## 2011-05-27 NOTE — Progress Notes (Signed)
Remaining in NSR on present meds. Will check digoxin level in am.

## 2011-05-28 ENCOUNTER — Inpatient Hospital Stay (HOSPITAL_COMMUNITY): Payer: Medicare Other

## 2011-05-28 LAB — BLOOD GAS, ARTERIAL
O2 Content: 4 L/min
O2 Saturation: 91 %
Patient temperature: 98.6
pH, Arterial: 7.396 (ref 7.350–7.400)

## 2011-05-28 LAB — GLUCOSE, CAPILLARY: Glucose-Capillary: 95 mg/dL (ref 70–99)

## 2011-05-28 LAB — DIGOXIN LEVEL: Digoxin Level: 0.8 ng/mL (ref 0.8–2.0)

## 2011-05-28 MED ORDER — LEVALBUTEROL HCL 1.25 MG/0.5ML IN NEBU
1.2500 mg | INHALATION_SOLUTION | RESPIRATORY_TRACT | Status: DC
  Filled 2011-05-28 (×6): qty 0.5

## 2011-05-28 MED ORDER — ALPRAZOLAM 0.25 MG PO TABS
0.5000 mg | ORAL_TABLET | Freq: Three times a day (TID) | ORAL | Status: DC | PRN
  Administered 2011-05-28 – 2011-06-01 (×5): 0.5 mg via ORAL
  Filled 2011-05-28 (×4): qty 1

## 2011-05-28 MED ORDER — LEVALBUTEROL HCL 0.63 MG/3ML IN NEBU
0.6300 mg | INHALATION_SOLUTION | RESPIRATORY_TRACT | Status: DC | PRN
  Filled 2011-05-28 (×2): qty 3

## 2011-05-28 MED ORDER — PREDNISONE 20 MG PO TABS
40.0000 mg | ORAL_TABLET | Freq: Every day | ORAL | Status: DC
  Administered 2011-05-29 – 2011-05-30 (×2): 40 mg via ORAL
  Filled 2011-05-28 (×3): qty 2

## 2011-05-28 MED ORDER — ALPRAZOLAM 0.5 MG PO TABS
0.5000 mg | ORAL_TABLET | ORAL | Status: AC
Start: 2011-05-28 — End: 2011-05-29

## 2011-05-28 MED ORDER — BUDESONIDE 0.5 MG/2ML IN SUSP
0.5000 mg | Freq: Two times a day (BID) | RESPIRATORY_TRACT | Status: DC
  Administered 2011-05-28 – 2011-06-04 (×14): 0.5 mg via RESPIRATORY_TRACT
  Filled 2011-05-28 (×18): qty 2

## 2011-05-28 MED ORDER — LEVALBUTEROL HCL 0.63 MG/3ML IN NEBU
0.6300 mg | INHALATION_SOLUTION | Freq: Four times a day (QID) | RESPIRATORY_TRACT | Status: DC
  Administered 2011-05-28 – 2011-06-04 (×25): 0.63 mg via RESPIRATORY_TRACT
  Filled 2011-05-28 (×32): qty 3

## 2011-05-28 MED ORDER — METHYLPREDNISOLONE SODIUM SUCC 125 MG IJ SOLR
60.0000 mg | Freq: Four times a day (QID) | INTRAMUSCULAR | Status: DC
  Administered 2011-05-28: 11:00:00 via INTRAVENOUS
  Filled 2011-05-28: qty 0.96

## 2011-05-28 MED ORDER — IPRATROPIUM BROMIDE 0.02 % IN SOLN
0.5000 mg | Freq: Four times a day (QID) | RESPIRATORY_TRACT | Status: DC
  Administered 2011-05-28 – 2011-05-29 (×4): 0.5 mg via RESPIRATORY_TRACT
  Filled 2011-05-28 (×4): qty 2.5

## 2011-05-28 MED ORDER — IPRATROPIUM BROMIDE 0.02 % IN SOLN: 0.5000 mg | Freq: Four times a day (QID) | RESPIRATORY_TRACT | Status: DC

## 2011-05-28 MED ORDER — LEVALBUTEROL HCL 0.63 MG/3ML IN NEBU
1.2500 mg | INHALATION_SOLUTION | RESPIRATORY_TRACT | Status: DC
  Filled 2011-05-28 (×6): qty 6

## 2011-05-28 MED ORDER — MORPHINE SULFATE 2 MG/ML IJ SOLN
1.0000 mg | INTRAMUSCULAR | Status: DC | PRN
  Administered 2011-05-28 – 2011-05-30 (×3): 1 mg via INTRAVENOUS
  Filled 2011-05-28 (×3): qty 1

## 2011-05-28 NOTE — Progress Notes (Signed)
11:15 Admission to 2108  Received report from [facility] RN ([address], [place] 1610). Patient's history and current medical assessment discussed. Per RN, pt SOB with SpO2 in 80s on 5 L [place]. Rapid response team at bedside. Dr. Delford Field ordered transfer based on respiratory status and most recent ABG (Please refer to Results Review to see ABG results). Patient transferred to the 2100. Patient appears calm but is complaining of SOB. Patient VSS ( Temp 96.3 Ax; HR 98 - NSR; BP 131/73). Patient placed on Bi-PAP by respiratory therapist. Dr. Sung Amabile at bedside to assess patient and discuss the need for Bi-PAP at this time. Patient agreed to Bi-PAP placement and noted "I can breathe better." SpO2 93-94% on Bi-PAP. Family joined patient at bedside and was updated about patient status and educated about the use of Bi-PAP. RN did complete assessment as documented (See ICU Assessment 11:30) RN will continue to monitor patient throughout shift.

## 2011-05-28 NOTE — Progress Notes (Signed)
No new cardiac complaints.  No further SVT. Rhythm stable NSR on digoxin.  Dig level normal 0.8.  Renal function normal. Will continue present heart meds.

## 2011-05-28 NOTE — Progress Notes (Signed)
UR Completed.  Renessa Wellnitz Jane [phone] 05/28/2011  

## 2011-05-28 NOTE — Consult Note (Signed)
Reviewed by Sonja Wilson EdD 

## 2011-05-28 NOTE — Progress Notes (Signed)
Critical lab value from ABG, ph 7.396, c02 66.7, 02 61.3 and bicarb 40.1 given to Dr. Delford Field at bedside

## 2011-05-28 NOTE — Progress Notes (Signed)
Upon assessment this am, pt with complains of shortness of breath, resp rate 28, o2 sats 82% on 4L [place], bp 134/72/ On call Dr. Molli Knock notified and he stated Dr. Delford Field was coming to see her. Breathing treatment given, morphine per request of NP from Palliative care given. Sats up to 94% on 4L [place]. Pt complaining that her lungs are feeling like they are squeezing. Dr. Delford Field came to bedside and orders received. Will closely monitor. Bedside o2 monitoring on.

## 2011-05-28 NOTE — Progress Notes (Signed)
1812 Pt HR 180s SVT. E-link called. Pt asked to cough, bare down, and exert force against RN hands. RN x 2 called to bedside Kandace Parkins) SVT broke and HR 90s within 1 minute. Dr. Vassie Loll On E-link Camera to assess pt and discuss status with RN. Patient alert and following commands. Mental status at baseline. Pt BP stable 118/53 (69). No interventions ordered at this time. Will continue to monitor and report issue to oncoming RN.

## 2011-05-28 NOTE — Progress Notes (Signed)
Repeated calls from floor RN.  Discordant care plan. Pt wants full code, refusing intubation/vent but wants "everything" done.  No recourse but to transfer back to ICU and likely intubate later vs trial on NIMVS  Austin Miles  [phone]  Cell  [phone]  If no response or cell goes to voicemail, call beeper [phone]

## 2011-05-28 NOTE — Progress Notes (Signed)
Progress Note from the Palliative Medicine Team at [facility]  Subjective:pt sitting on side of bed , in visible resp distress, continued discussion with her goals of care and possible outcomes of this recent episode     Objective: No Known Allergies Scheduled Meds:   . aspirin  81 mg Oral Daily  . atorvastatin  40 mg Oral q1800  . clonazePAM  0.5 mg Oral TID  . clopidogrel  75 mg Per Tube Daily  . digoxin  0.25 mg Oral Daily  . diltiazem  180 mg Oral Daily  . feeding supplement  237 mL Oral BID BM  . guaiFENesin  1,200 mg Oral BID  . hydrALAZINE  25 mg Oral Q8H  . insulin aspart  0-9 Units Subcutaneous TID WC  . ipratropium  0.5 mg Nebulization QID  . isosorbide mononitrate  30 mg Oral Daily  . levalbuterol  1.25 mg Nebulization Q4H  . magic mouthwash  5 mL Oral TID  . methylPREDNISolone (SOLU-MEDROL) injection  60 mg Intravenous Q6H  . pantoprazole  40 mg Oral Q1200  . Travoprost (BAK Free)  1 drop Both Eyes QHS  . DISCONTD: ipratropium  0.5 mg Nebulization Q6H  . DISCONTD: levalbuterol  0.63 mg Nebulization Q6H  . DISCONTD: levalbuterol  1.25 mg Nebulization Q4H  . DISCONTD: predniSONE  40 mg Oral Q breakfast   Continuous Infusions:  PRN Meds:.acetaminophen, LORazepam, morphine injection, ondansetron (ZOFRAN) IV  BP 134/72  Pulse 97  Temp(Src) 97.8 F (36.6 C) (Oral)  Resp 18  Ht 5\' 9"  (1.753 m)  Wt 73 kg (160 lb 15 oz)  BMI 23.77 kg/m2  SpO2 94%   PPS:30%  Pain Score:chest pressure   Intake/Output Summary (Last 24 hours) at 05/28/11 1150 Last data filed at 05/28/11 1100  Gross per 24 hour  Intake    540 ml  Output   1401 ml  Net   -861 ml       Physical Exam:  General: frail, weak, SOB at rest HEENT:  Dry mucous membranes Chest:   Diminished scattered coarse BS, clears intermittently with cough CVS: RRR Abdomen:soft NT +BS Ext: trace edema ,,BLE, mottled when feet are dependant  Neuro:alert and oriented X3  Labs: CBC    Component Value  Date/Time   WBC 15.9* 05/25/2011 0500   RBC 4.36 05/25/2011 0500   HGB 14.0 05/25/2011 0500   HCT 42.6 05/25/2011 0500   PLT 154 05/25/2011 0500   MCV 97.7 05/25/2011 0500   MCH 32.1 05/25/2011 0500   MCHC 32.9 05/25/2011 0500   RDW 12.8 05/25/2011 0500   LYMPHSABS 0.8 05/25/2011 0500   MONOABS 0.6 05/25/2011 0500   EOSABS 0.0 05/25/2011 0500   BASOSABS 0.0 05/25/2011 0500    BMET    Component Value Date/Time   NA 141 05/27/2011 0540   K 4.1 05/27/2011 0540   CL 97 05/27/2011 0540   CO2 40* 05/27/2011 0540   GLUCOSE 103* 05/27/2011 0540   BUN 20 05/27/2011 0540   CREATININE 0.46* 05/27/2011 0540   CALCIUM 8.5 05/27/2011 0540   GFRNONAA >90 05/27/2011 0540   GFRAA >90 05/27/2011 0540    CMP     Component Value Date/Time   NA 141 05/27/2011 0540   K 4.1 05/27/2011 0540   CL 97 05/27/2011 0540   CO2 40* 05/27/2011 0540   GLUCOSE 103* 05/27/2011 0540   BUN 20 05/27/2011 0540   CREATININE 0.46* 05/27/2011 0540   CALCIUM 8.5 05/27/2011 0540   PROT 5.2*  05/21/2011 0415   ALBUMIN 3.0* 05/21/2011 0415   AST 14 05/21/2011 0415   ALT 15 05/21/2011 0415   ALKPHOS 39 05/21/2011 0415   BILITOT 0.5 05/21/2011 0415   GFRNONAA >90 05/27/2011 0540   GFRAA >90 05/27/2011 0540       1  Assessment and Plan: 1. Code Status:Full Code-continue to express concern with patient regarding futility 2. Symptom Control:added prn morphine for dyspnea 3. Psycho/Social:emotional support offered 4. Spiritual community church support 5. Disposition:continue to verbalize hope for improvement, medically not stable , disposition dependent on outcomes    Time In Time Out Total Time Spent with Patient Total Overall Time  0800 0845 35 45    Greater than 50%  of this time was spent counseling and coordinating care related to the above assessment and plan.  Lorinda Creed NP   [phone]   1

## 2011-05-28 NOTE — Progress Notes (Signed)
Pt continues to desat to the lower 80s with the highest being 88% on 5l [place]. She asked me if she was dying, pt continues to struggle and very weak. Dr. Delford Field notified and orders recieved

## 2011-05-28 NOTE — Progress Notes (Signed)
1454 Respiratory therapist removed Bi-PAP and placed patient on 2 L [place]. Patients SpO2 95% at this time.   1458 Patient complaining of tight chest and SOB. SpO2 decreased from 90 to 88. RN increased O2 to 4 L. Patient SpO2 continuing to decline to 85. Patient requested Bi-PAP. Respiratory therapist paged.  1500 Respiratory therapist restarted Bi-PAP. Patient stated, "I can breathe better." RN asked if patient was more comfortable with the Bi-PAP and the patient responded "yes." RN will continue to monitor.

## 2011-05-28 NOTE — Progress Notes (Signed)
Name: LEYLAH TARNOW MRN: [id number] DOB: Oct 12, 1940    LOS: 13  [facility]  History of Present Illness:   56 YOWF COPD (oxygen dependant 2.5 liters), presented via EMS on 3/20 w/ CC:  not feeling well for 2-3 days was ambulatory upon EMS arrival, but patient's condition deteriorated in route. Upon arrival to the ED, she was nonverbal, and leaning forward, with expiratory wheezing. Patient was placed on BiPAP immediately. No reported fevers. No chest pains. Failed attempt at NIPPV and therefore intubated. She was given Solu-Medrol and multiple breathing treatments in route. Required pressors transiently post intubation.    Lines / Drains: OETT 3/20>>>3/22 ETT 3/24>>>3/27 Left IJ CVL 3/20>>>plan out 3/29  Cultures: Sputum 3/20>>>NF BCx2 3/20>>>neg UC 3/20>>>neg MRSA PCR>>>neg  Antibiotics: Rocephin 3/20>>>3/28 azithro 3/20>>>3/25  Tests / Events: Echo 3/20>>> EF 65-70%  LVH   SUBJECTIVE: Pt refuses ICU tfr and intubation but is conflicted in that she still wants aggressive care.  See pall care note as well. Pt much worse this am   Vital Signs:  Filed Vitals:   05/28/11 0018 05/28/11 0209 05/28/11 0500 05/28/11 0751  BP:   129/71   Pulse: 94  86   Temp:   97.8 F (36.6 C)   TempSrc:   Oral   Resp: 18  18   Height:      Weight:   73 kg (160 lb 15 oz)   SpO2: 94% 90% 93% 94%     I/O last 3 completed shifts: In: 480 [P.O.:480] Out: 1701 [Urine:1700; Stool:1]  Physical Examination: Gen: in mild distress. Weak. Nasal prong O2 5 L HEENT: No JVD. Mild hoarseness. PULM: distant BS, rhonchi CV: RRR, no mgr,  AB: BS+, soft, nontender Ext: No edema   Medications reviewed     Labs and Imaging:   Lab 05/27/11 0540 05/25/11 0500 05/24/11 0603  NA 141 137 139  K 4.1 3.5 3.7  CL 97 89* 90*  CO2 40* 39* 41*  BUN 20 23 24*  CREATININE 0.46* 0.46* 0.49*  GLUCOSE 103* 189* 128*    Lab 05/25/11 0500 05/22/11 0435  HGB 14.0 13.1  HCT 42.6 40.8   WBC 15.9* 9.1  PLT 154 139*   Dg Chest Port 1 View  05/26/2011  *RADIOLOGY REPORT*  Clinical Data: COPD and leukocytosis  PORTABLE CHEST - 1 VIEW  Comparison: 05/23/2011  Findings: Heart size appears normal.  There are low lung volumes.  Coarsened interstitial markings are noted bilaterally.  There is been slight interval improvement in aeration to the left base.  No change in right base opacities.  IMPRESSION:  1.  Mild improvement in aeration to the right lung base.  Original Report Authenticated By: Rosealee Albee, M.D.   Assessment and Plan:  Acute-on-chronic respiratory failure in setting of possible CAP (minimal infiltrate) and Acute exacerbation of chronic obstructive pulmonary disease (COPD)  The pt is re exacerbating: Plan: Resume IV medrol Intensify BD neb meds Flutter valve Mucinex Titrate oxygen ABG Cancel ICU transfer for now.      CAD - pos trops -asa/plavix -echo 70-65%% apprec cards consult  SVT- EKGs reviewed- in paper file. Plan- cont  diltiazem po to 180 mg. Watch BP.  Dig level ok at 0.8  Acute encephalopathy: multifactorial, resolved Plan: Anxiety, conitnue benzo Chair , mobilize, PT on going Chair position goal  Hyperglycemia Plan: -ssi To ac hs  Anxiety Plan: -schedule clonazepam 0.5 tid -PRN ativan  Thrush- mild.  Plan-Will cont magic mouthwash  GERD ppi    Shan Levans Beeper  [phone]  Cell  [phone]  If no response or cell goes to voicemail, call beeper [phone]

## 2011-05-28 NOTE — Progress Notes (Signed)
Asked by primary RN to see pt for potential transfer to ICU.  On assessment, pt is lying on Lt side & lethargic.  BS diminished t/o.  Dr. Delford Field in to see pt & discussed POC.  Will cont. To monitor pt of this floor for now.

## 2011-05-29 DIAGNOSIS — J438 Other emphysema: Secondary | ICD-10-CM

## 2011-05-29 DIAGNOSIS — J9620 Acute and chronic respiratory failure, unspecified whether with hypoxia or hypercapnia: Secondary | ICD-10-CM

## 2011-05-29 DIAGNOSIS — F411 Generalized anxiety disorder: Secondary | ICD-10-CM

## 2011-05-29 LAB — CBC
HCT: 41.3 % (ref 36.0–46.0)
Hemoglobin: 13.5 g/dL (ref 12.0–15.0)
MCH: 31.7 pg (ref 26.0–34.0)
MCHC: 32.7 g/dL (ref 30.0–36.0)
RDW: 13.3 % (ref 11.5–15.5)

## 2011-05-29 LAB — BASIC METABOLIC PANEL
BUN: 24 mg/dL — ABNORMAL HIGH (ref 6–23)
Creatinine, Ser: 0.4 mg/dL — ABNORMAL LOW (ref 0.50–1.10)
GFR calc Af Amer: >90 mL/min (ref >90)
GFR calc non Af Amer: >90 mL/min (ref >90)
Glucose, Bld: 115 mg/dL — ABNORMAL HIGH (ref 70–99)

## 2011-05-29 MED ORDER — ADENOSINE 6 MG/2ML IV SOLN
INTRAVENOUS | Status: AC
  Administered 2011-05-29: 6 mg
  Filled 2011-05-29: qty 4

## 2011-05-29 MED ORDER — DILTIAZEM HCL ER COATED BEADS 240 MG PO CP24
240.0000 mg | ORAL_CAPSULE | Freq: Every day | ORAL | Status: DC
  Administered 2011-05-29: 240 mg via ORAL
  Filled 2011-05-29 (×2): qty 1

## 2011-05-29 MED ORDER — ALPRAZOLAM 0.5 MG PO TABS
1.0000 mg | ORAL_TABLET | Freq: Every day | ORAL | Status: DC
  Administered 2011-05-30: 1 mg via ORAL
  Filled 2011-05-29: qty 1
  Filled 2011-05-29: qty 2
  Filled 2011-05-29: qty 1

## 2011-05-29 MED ORDER — WHITE PETROLATUM GEL
Status: AC
  Administered 2011-05-29: 18:00:00
  Filled 2011-05-29: qty 5

## 2011-05-29 MED ORDER — ALPRAZOLAM ER 1 MG PO TB24: 1.0000 mg | ORAL_TABLET | Freq: Every day | ORAL | Status: DC

## 2011-05-29 NOTE — Significant Event (Signed)
Patient experienced an episode of SVT at ~ 9:45am with HR averaging in the 150s.  Patient remained alert and fully oriented; BP stable and within normal limits.  Pt asked to cough and valsalva by RN without resolution of SVT.  Attending physician present.  Carotid massage attempted without resolution of SVT.  Adenosine 6mg  administered followed by flush; unfortunately IV infiltrated. Carotid massage repeated with resolution of SVT.  New PIV placed in foot.  Will continue to monitor

## 2011-05-29 NOTE — Progress Notes (Signed)
Physical Therapy Treatment Patient Details Name: [name] [name] MRN: [id number] DOB: April 30, 1940 Today's Date: 05/29/2011  PT Assessment/Plan  PT - Assessment/Plan Comments on Treatment Session: Anxiety seems to be her biggest limiting factor with poor respiratory reserve, O2 sats dropping into 80s during any movement with increased RR. Max cueing throughout to relieve anxiety, cues for pursed lip breathing.  PT Plan: Discharge plan remains appropriate Follow Up Recommendations: Skilled nursing facility Equipment Recommended: Defer to next venue PT Goals  Acute Rehab PT Goals PT Goal: Supine/Side to Sit - Progress: Progressing toward goal PT Goal: Sit to Stand - Progress: Progressing toward goal PT Goal: Stand to Sit - Progress: Progressing toward goal PT Transfer Goal: Bed to Chair/Chair to Bed - Progress: Progressing toward goal PT Goal: Perform Home Exercise Program - Progress: Progressing toward goal  PT Treatment Precautions/Restrictions  Precautions Precautions: Fall Precaution Comments: very anxious Required Braces or Orthoses: No Restrictions Weight Bearing Restrictions: No Mobility (including Balance) Bed Mobility Supine to Sit: 4: Min assist;HOB elevated (Comment degrees) Supine to Sit Details (indicate cue type and reason): inc time secondary to pt anxiety; max encouragement and cues for relaxation Sitting - Scoot to Edge of Bed: 5: Supervision Transfers Sit to Stand: 1: +2 Total assist;With upper extremity assist;From bed;From chair/3-in-1;With armrests (80%) Sit to Stand Details (indicate cue type and reason): facilitation for follow through; cues for safe hand placement Stand to Sit: 3: Mod assist;With upper extremity assist;To chair/3-in-1;With armrests Stand to Sit Details: cues for safe technique and to control descent Stand Pivot Transfers: 3: Mod assist;With armrests Stand Pivot Transfer Details (indicate cue type and reason): cues for sequencing and encouragement;  pt used RW for transfer (SPT x2 bed->3in1->recliner; with most cueing for encouragement and sequencing as pt very anxious) Ambulation/Gait Ambulation/Gait: No (pt very anxious, SpO2 in mid 80s on venturi mask)    Exercise  General Exercises - Lower Extremity Ankle Circles/Pumps: AROM;Both;20 reps;Supine Heel Slides: AROM;Both;10 reps;Supine Hip ABduction/ADduction: AROM;Both;10 reps;Supine Straight Leg Raises: AROM;Both;5 reps;Supine End of Session PT - End of Session Equipment Utilized During Treatment: Gait belt Activity Tolerance: Treatment limited secondary to agitation;Patient limited by fatigue (anxiety) Patient left: in chair;with call bell in reach Nurse Communication: Mobility status for transfers (RN present through most of session) General Behavior During Session: Agitated (anxious) Cognition: [facility] for tasks performed  [facility] HELEN 05/29/2011, 12:25 PM

## 2011-05-29 NOTE — Progress Notes (Signed)
Pt alert and oriented, very articulate through breathing mask.  CSW spoke with pt at length regarding her wishes for health care treatment. Pt states very clearly that she wishes to go home at d/c, and is open to home health RN, PT, and OT if needed. Pt states she has strong support from her husband, friends, and neighbors and will never be alone. Pt states "I know my body and I know what I need to do to get on my feet again."  Pt acknowledged experience of anxiety during this acute illness, stressing that she feels anxious when she feels she is not being listened to. Pt expressed appreciation for Dr Bard Herbert' attention to her wishes and needs, and states she feels less anxious now that she "has a doctor who listens"  CSW provided support and validation for pt experience and concerns. Will refer to [facility] for home health needs and will follow, per pt request, to provide crisis counseling and support.   Baxter Flattery, MSW [phone]

## 2011-05-29 NOTE — Progress Notes (Signed)
Occupational Therapy Treatment Patient Details Name: [name] [name] MRN: [id number] DOB: [birth year] Today's Date: 05/29/2011  OT Assessment/Plan OT Assessment/Plan Comments on Treatment Session: 02 sats decreased down into upper 70's with activity on venturi mask. returned to lower to mid 90's at rest OT Plan: Discharge plan remains appropriate Follow Up Recommendations: Skilled nursing facility Equipment Recommended: 3 in 1 bedside comode;Tub/shower bench OT Goals ADL Goals ADL Goal: Toilet Transfer - Progress: Progressing toward goals ADL Goal: Toileting - Clothing Manipulation - Progress: Progressing toward goals ADL Goal: Toileting - Hygiene - Progress: Progressing toward goals  OT Treatment Precautions/Restrictions  Precautions Precautions: Fall Precaution Comments: very anxious Restrictions Weight Bearing Restrictions: No   ADL ADL Toilet Transfer: Performed;Moderate assistance Toilet Transfer Details (indicate cue type and reason): pt very anxious that she would have another "episode like the one last night" (respiratory distress) and therefore required increased time and assist Toilet Transfer Method: Stand pivot Toilet Transfer Equipment: Bedside commode Toileting - Clothing Manipulation: Performed;+1 Total assistance Where Assessed - Glass blower/designer Manipulation: Standing Toileting - Hygiene: Performed;+1 Total assistance Where Assessed - Toileting Hygiene: Sit to stand from 3-in-1 or toilet Tub/Shower Transfer: Not assessed ADL Comments: Pt limited today by anxiety. RN aware and in room. Pt repeatedly stated, "You people just push me and I can't move like a rabbit!" Mobility  Bed Mobility Supine to Sit: 4: Min assist;HOB elevated (Comment degrees) Supine to Sit Details (indicate cue type and reason): inc time secondary to pt anxiety; max encouragement and cues for relaxation Sitting - Scoot to Edge of Bed: 5: Supervision Transfers Sit to Stand: 1: +2 Total  assist;With upper extremity assist;From bed;From chair/3-in-1;With armrests (80%) Sit to Stand Details (indicate cue type and reason): facilitation for follow through; cues for safe hand placement Stand to Sit: 3: Mod assist;With upper extremity assist;To chair/3-in-1;With armrests Stand to Sit Details: cues for safe technique and to control descent  End of Session OT - End of Session Activity Tolerance:  (Patient limited by anxiety and dec 02 sats) Patient left: in chair;with call bell in reach;with family/visitor present Nurse Communication: Mobility status for transfers General Behavior During Session:  (anxious) Cognition: [facility] for tasks performed  Endrit Gittins  05/29/2011, 12:23 PM

## 2011-05-29 NOTE — Progress Notes (Signed)
Progress Note from the Palliative Medicine Team at [facility]  Subjective: ill appearing weakened female, increased work of breathing at rest     Objective: No Known Allergies Scheduled Meds:   . adenosine      . ALPRAZolam  0.5 mg Oral NOW  . ALPRAZolam  1 mg Oral Daily  . aspirin  81 mg Oral Daily  . atorvastatin  40 mg Oral q1800  . budesonide  0.5 mg Nebulization BID  . clopidogrel  75 mg Per Tube Daily  . digoxin  0.25 mg Oral Daily  . diltiazem  240 mg Oral Daily  . feeding supplement  237 mL Oral BID BM  . hydrALAZINE  25 mg Oral Q8H  . insulin aspart  0-9 Units Subcutaneous TID WC  . isosorbide mononitrate  30 mg Oral Daily  . levalbuterol  0.63 mg Nebulization Q6H  . pantoprazole  40 mg Oral Q1200  . predniSONE  40 mg Oral Q breakfast  . Travoprost (BAK Free)  1 drop Both Eyes QHS  . DISCONTD: ALPRAZolam  1 mg Oral Daily  . DISCONTD: diltiazem  180 mg Oral Daily  . DISCONTD: ipratropium  0.5 mg Nebulization Q6H   Continuous Infusions:  PRN Meds:.acetaminophen, ALPRAZolam, levalbuterol, morphine injection, ondansetron (ZOFRAN) IV  BP 121/52  Pulse 73  Temp(Src) 97.8 F (36.6 C) (Axillary)  Resp 18  Ht 5\' 9"  (1.753 m)  Wt 72.9 kg (160 lb 11.5 oz)  BMI 23.73 kg/m2  SpO2 100%   PPS:20%  Pain Score:denies Pain Location   Intake/Output Summary (Last 24 hours) at 05/29/11 1623 Last data filed at 05/29/11 0930  Gross per 24 hour  Intake    250 ml  Output   1975 ml  Net  -1725 ml       Physical Exam:  General: chronically ill appearing, dyspneic at rest HEENT:  Dry mucous membranes Chest:   Diminished, + ex Wh CVS: RRR Abdomen:soft nt +BS Ext: trace edema BLE Neuro:alert and oriented  Labs: CBC    Component Value Date/Time   WBC 16.8* 05/29/2011 0400   RBC 4.26 05/29/2011 0400   HGB 13.5 05/29/2011 0400   HCT 41.3 05/29/2011 0400   PLT 158 05/29/2011 0400   MCV 96.9 05/29/2011 0400   MCH 31.7 05/29/2011 0400   MCHC 32.7 05/29/2011 0400   RDW 13.3  05/29/2011 0400   LYMPHSABS 0.8 05/25/2011 0500   MONOABS 0.6 05/25/2011 0500   EOSABS 0.0 05/25/2011 0500   BASOSABS 0.0 05/25/2011 0500    BMET    Component Value Date/Time   NA 140 05/29/2011 0400   K 4.7 05/29/2011 0400   CL 94* 05/29/2011 0400   CO2 39* 05/29/2011 0400   GLUCOSE 115* 05/29/2011 0400   BUN 24* 05/29/2011 0400   CREATININE 0.40* 05/29/2011 0400   CALCIUM 8.9 05/29/2011 0400   GFRNONAA >90 05/29/2011 0400   GFRAA >90 05/29/2011 0400    CMP     Component Value Date/Time   NA 140 05/29/2011 0400   K 4.7 05/29/2011 0400   CL 94* 05/29/2011 0400   CO2 39* 05/29/2011 0400   GLUCOSE 115* 05/29/2011 0400   BUN 24* 05/29/2011 0400   CREATININE 0.40* 05/29/2011 0400   CALCIUM 8.9 05/29/2011 0400   PROT 5.2* 05/21/2011 0415   ALBUMIN 3.0* 05/21/2011 0415   AST 14 05/21/2011 0415   ALT 15 05/21/2011 0415   ALKPHOS 39 05/21/2011 0415   BILITOT 0.5 05/21/2011 0415   GFRNONAA >90  05/29/2011 0400   GFRAA >90 05/29/2011 0400    Continued discussion regarding advanced directives.  Both patient and husband are adamant about continuing full aggressive care.   1 Assessment and Plan: 1. Code Status:Full code 2. Symptom Control: anticipatory meds in place 3. Psycho/Social: detailed conversation regarding medical choices and emotional support in this difficult situation      Time In Time Out Total Time Spent with Patient Total Overall Time  1600 1635 35 35    Greater than 50%  of this time was spent counseling and coordinating care related to the above assessment and plan.  Lorinda Creed NP  [phone]   1

## 2011-05-29 NOTE — Progress Notes (Signed)
Name: [name] [name] MRN: [id number] DOB: 11-06-1940    LOS: 14  PCCM Progress NOTE  Subjective: patient feeling well this am; states she is feeling better than she was yesterday.  Patent had one episode of SVT overnight that resolved with valsalva; she was asymptomatic during that time.  No other acute events.  She reports significant anxiety with use of bipap but notes this improves with anti-anxiety medication.  She has no other concerns or complaints at this time.  Vital Signs: Temp:  [96.3 F (35.7 C)-97.9 F (36.6 C)] 97.8 F (36.6 C) (04/03 0400) Pulse Rate:  [71-111] 71  (04/03 0700) Resp:  [12-22] 13  (04/03 0700) BP: (102-150)/(51-73) 125/51 mmHg (04/03 0700) SpO2:  [80 %-98 %] 93 % (04/03 0700) FiO2 (%):  [30 %-40 %] 35 % (04/03 0700) Weight:  [160 lb 11.5 oz (72.9 kg)] 160 lb 11.5 oz (72.9 kg) (04/03 0500) I/O last 3 completed shifts: In: 550 [P.O.:540; I.V.:10] Out: 1575 [Urine:1575]  Lines, Tubes, etc: OETT 3/20>>>3/22  ETT 3/24>>>3/27  Left IJ CVL 3/20>>>plan out 3/29  Microbiology: Sputum 3/20>>>NF  BCx2 3/20>>>neg  UC 3/20>>>neg  MRSA PCR>>>neg  Antibiotics:  Rocephin 3/20>>>3/28  azithro 3/20>>>3/25  Studies/Events: Echo 3/20>>> EF 65-70% LVH  Consults: none  Physical Examination: Vital signs reviewed. GEN: No apparent distress.  Alert and oriented x 3.  Pleasant, conversant, and cooperative to exam. HEENT: head is autraumatic and normocephalic.  Neck is supple without palpable masses or lymphadenopathy.  No JVD or carotid bruits.  Vision intact.  EOMI.  PERRLA.  Sclerae anicteric.  Conjunctivae without pallor or injection. Mucous membranes are dry.  Oropharynx is without erythema, exudates, or other abnormal lesions.  RESP:  Lungs are clear to ascultation bilaterally with diminshed air movement.  Very mild diffuse expiratory wheezing bilaterally; no ronchi, or rubs.  Prolonged expiratory phase noted. CARDIOVASCULAR: regular rate, normal rhythm.  Clear  S1, S2, no murmurs, gallops, or rubs. ABDOMEN: soft, non-tender, non-distended.  Bowels sounds present in all quadrants and normoactive.  No palpable masses. EXT: warm and dry.  Peripheral pulses equal, intact, and +2 globally.  No clubbing or cyanosis. +1 dema in bilateral lower extremities. SKIN: warm and dry with normal turgor.  No rashes or abnormal lesions observed. NEURO:  Alert, oriented, thought content appropriate.  Speech fluent without evidence of aphasia.  Able to follow 3 step commands without difficulty. CN 2-12 grossly intact.  No focal deficit  HOSPITAL MEDICATIONS:  I have reviewed the patient's current medications.   Lab Results: Basic Metabolic Panel:  Lab 05/29/11 0865 05/27/11 0540 05/24/11 0603 05/23/11 0531  NA 140 141 -- --  K 4.7 4.1 -- --  CL 94* 97 -- --  CO2 39* 40* -- --  GLUCOSE 115* 103* -- --  BUN 24* 20 -- --  CREATININE 0.40* 0.46* -- --  CALCIUM 8.9 8.5 -- --  MG -- -- 2.1 2.1  PHOS -- -- 5.1* 4.2   CBC:  Lab 05/29/11 0400 05/25/11 0500  WBC 16.8* 15.9*  NEUTROABS -- 14.5*  HGB 13.5 14.0  HCT 41.3 42.6  MCV 96.9 97.7  PLT 158 154   CBG:  Lab 05/28/11 2137 05/28/11 1148 05/28/11 0801 05/27/11 2115 05/27/11 1703 05/27/11 1117  GLUCAP 138* 84 95 107* 151* 112*   Urinalysis:  Lab 05/26/11 1645  COLORURINE YELLOW  LABSPEC 1.011  PHURINE 6.5  GLUCOSEU NEGATIVE  HGBUR NEGATIVE  BILIRUBINUR NEGATIVE  KETONESUR NEGATIVE  PROTEINUR NEGATIVE  UROBILINOGEN 1.0  NITRITE NEGATIVE  LEUKOCYTESUR NEGATIVE     Imaging:   05/28/11: CXR, portable: unchanged left lower lobe infiltrate with possible effusion  Assessment and Plan:  Acute exacerbation of chronic respiratory failure/COPD:  Patient is improved.  She remains afebrile and hemodynamically stable and is able to maintain adequate oxygenation with venturi mask and intermittent use of Bipap.  Anxiety is a significant issue that provokes dyspneic episodes and also exacerbates her underlying  COPD. - Continue with BiPap prn - Will continue xopenex nebs as well as prednisone taper - WIll d/c atrovent as discussed below  Urinary retention: patient unable to void today with reported rentention of ~923mL urine by RN.  Anticholinergic effects of atrovent are likely a cause/contributing factor to pts urinary retention. - D/C Atrovent - Perform in and out cath now - Will need to closely follow ins and outs - Consider bladder scan if urinary retention persists  SVT: pt evaluated by cardiology today; plan is to increase dilatiazem to 240mg  daily. - Appreciate cardiology input and assistance with patient - Continue telemetry - Continue to use vagal maneuvers and ACLS protocal (adenosine, etc) as needed  - Continue cardiazem and dig per cards recs  Anxiety:  Will start Xanax 1mg  extended release for improved control of anxiety and continue with current xanax 0.5 mg tid prn.    Dispo: will transfer pt to SDU today  Best Practice: ZOX:WRUE SUP: not indicated Nutrition: heart healthy diet Glycemic control:  insulin Sedation/analgesia: morphine prn  MILLS,KRISTIN 05/29/2011, 8:22 AM   Pt seen and examined and database reviewed. I agree with above findings, assessment and plan  Billy Fischer, MD;  PCCM service; Mobile [phone]

## 2011-05-29 NOTE — Progress Notes (Signed)
1013 Pt HR SVT (See note). SpO2 82-84% on Venti mask. MD and RT at bedside. Patient place on non-rebreather. SpO2 94%  Will continue to monitor patient. At this time respiratory therapists have chosen to keep patient on non-rebreather vs. Bipap per patient request. Will continue to assess ability to wean from non-rebreather throughout shift.

## 2011-05-29 NOTE — Progress Notes (Signed)
1610 Patient complaining of difficulty urinating and stating "Ireally have to pee but I can't". RN did bladder scan on pt. Bladder scan reading >999cc. Dr. Sung Amabile and CCM team aware and at bedside. Straight catheter ordered for in and out cath. Pt urine output = 1400. Will continue to monitor patient. CCM team will be making adjustments in medications to remove anticholinergic medications that may be causing urine retention. Will report to oncoming RN patient issue and possibility of need for another in and out catheterization overnight.

## 2011-05-29 NOTE — Progress Notes (Signed)
Subjective:  The patient was moved back to 2100 yesterday because of pulmonary issues.  No chest pain.  She did have recurrence of PSVT yesterday evening which broke with Valsalva maneuver.    Objective:  Vital Signs in the last 24 hours: Temp:  [96.3 F (35.7 C)-97.9 F (36.6 C)] 97.8 F (36.6 C) (04/03 0400) Pulse Rate:  [71-111] 71  (04/03 0700) Resp:  [12-22] 13  (04/03 0700) BP: (102-150)/(51-73) 125/51 mmHg (04/03 0700) SpO2:  [80 %-98 %] 93 % (04/03 0700) FiO2 (%):  [30 %-40 %] 35 % (04/03 0700) Weight:  [72.9 kg (160 lb 11.5 oz)] 72.9 kg (160 lb 11.5 oz) (04/03 0500)  Intake/Output from previous day: 04/02 0701 - 04/03 0700 In: 550 [P.O.:540; I.V.:10] Out: 775 [Urine:775] Intake/Output from this shift:       . ALPRAZolam  0.5 mg Oral NOW  . aspirin  81 mg Oral Daily  . atorvastatin  40 mg Oral q1800  . budesonide  0.5 mg Nebulization BID  . clopidogrel  75 mg Per Tube Daily  . digoxin  0.25 mg Oral Daily  . diltiazem  180 mg Oral Daily  . feeding supplement  237 mL Oral BID BM  . hydrALAZINE  25 mg Oral Q8H  . insulin aspart  0-9 Units Subcutaneous TID WC  . ipratropium  0.5 mg Nebulization Q6H  . isosorbide mononitrate  30 mg Oral Daily  . levalbuterol  0.63 mg Nebulization Q6H  . pantoprazole  40 mg Oral Q1200  . predniSONE  40 mg Oral Q breakfast  . Travoprost (BAK Free)  1 drop Both Eyes QHS  . DISCONTD: clonazePAM  0.5 mg Oral TID  . DISCONTD: guaiFENesin  1,200 mg Oral BID  . DISCONTD: ipratropium  0.5 mg Nebulization Q6H  . DISCONTD: ipratropium  0.5 mg Nebulization QID  . DISCONTD: levalbuterol  0.63 mg Nebulization Q6H  . DISCONTD: levalbuterol  1.25 mg Nebulization Q4H  . DISCONTD: levalbuterol  1.25 mg Nebulization Q4H  . DISCONTD: methylPREDNISolone (SOLU-MEDROL) injection  60 mg Intravenous Q6H  . DISCONTD: predniSONE  40 mg Oral Q breakfast      Physical Exam: The patient appears to be in no distress.  Head and neck exam reveals that  the pupils are equal and reactive.  The extraocular movements are full.  There is no scleral icterus.  Mouth and pharynx are benign.  No lymphadenopathy.  No carotid bruits.  The jugular venous pressure is normal.  Thyroid is not enlarged or tender.  Chest is clear to percussion and auscultation.  No rales or rhonchi.  Expansion of the chest is symmetrical.  Heart reveals no abnormal lift or heave.  First and second heart sounds are normal.  There is no murmur gallop rub or click.  The abdomen is soft and nontender.  Bowel sounds are normoactive.  There is no hepatosplenomegaly or mass.  There are no abdominal bruits.  Extremities reveal no phlebitis or edema.  Pedal pulses are good.  There is no cyanosis or clubbing.  Neurologic exam is normal strength and no lateralizing weakness.  No sensory deficits.  Integument reveals no rash  Lab Results:  Basename 05/29/11 0400  WBC 16.8*  HGB 13.5  PLT 158    Basename 05/29/11 0400 05/27/11 0540  NA 140 141  K 4.7 4.1  CL 94* 97  CO2 39* 40*  GLUCOSE 115* 103*  BUN 24* 20  CREATININE 0.40* 0.46*   No results found for this basename:  TROPONINI:2,CK,MB:2 in the last 72 hours Hepatic Function Panel No results found for this basename: PROT,ALBUMIN,AST,ALT,ALKPHOS,BILITOT,BILIDIR,IBILI in the last 72 hours No results found for this basename: CHOL in the last 72 hours No results found for this basename: PROTIME in the last 72 hours  Imaging: Dg Chest Port 1 View  05/28/2011  *RADIOLOGY REPORT*  Clinical Data: Shortness of breath  PORTABLE CHEST - 1 VIEW  Comparison: 05/26/2011  Findings: Shallow inspiration with elevation of the left hemidiaphragm.  Infiltration or atelectasis in the left lung base appears stable since previous study.  Emphysematous changes in the lungs.  Scattered interstitial changes likely due to fibrosis. Calcification of the aorta.  No pneumothorax.  No blunting of costophrenic angles.  Old right rib fractures.   IMPRESSION: Stable appearance of infiltration or atelectasis in the left lung base.  Emphysematous changes and fibrosis in the lungs.  Original Report Authenticated By: Marlon Pel, M.D.    Cardiac Studies: Telemetry shows NSR Assessment/Plan:  Patient Active Hospital Problem List:  CAD (coronary artery disease) (05/23/2011)   Assessment: No chest pain.   Plan: Continue ASA, statin, Imdur, plavix SVT (supraventricular tachycardia) (05/24/2011)   Assessment: Brief recurrence yesterday while in respiratory distress, resolved with Valsalva.   Plan: Continue digoxin, increase cardizem to 240 mg daily    LOS: 14 days    Cassell Clement 05/29/2011, 7:37 AM

## 2011-05-29 NOTE — Progress Notes (Signed)
1013 Pt HR 170-180s in SVT. Dr. Sung Amabile called to bedside. RN x 2 at bedside. RN Horace Porteous asked patient to bare down, cough, and push arms against resistance. Repeated attempts with no break in SVT. Dr. Sung Amabile ordered 6 mg adenosine IV push. RN noted #22 g PIV in R hand infiltrated and attempted insertion of new PIV. Without successful attempts of PIV in arms, Dr. Sung Amabile ordered PIV ok in foot. A #18 g PIV inserted in foot per verbal order Manon Hilding present to confirm order). Adenosine administered and pt rhythm converted to SR HR 70-80s. Will continue to monitor patient throughout shift and alert oncoming staff to pt issue. IV team called to bedside for IV insertion.

## 2011-05-30 LAB — GLUCOSE, CAPILLARY
Glucose-Capillary: 141 mg/dL — ABNORMAL HIGH (ref 70–99)
Glucose-Capillary: 125 mg/dL — ABNORMAL HIGH (ref 70–99)
Glucose-Capillary: 90 mg/dL (ref 70–99)
Glucose-Capillary: 120 mg/dL — ABNORMAL HIGH (ref 70–99)

## 2011-05-30 MED ORDER — GLUCERNA SHAKE PO LIQD
237.0000 mL | Freq: Three times a day (TID) | ORAL | Status: DC
  Administered 2011-05-30 – 2011-06-02 (×8): 237 mL via ORAL

## 2011-05-30 MED ORDER — PREDNISONE 20 MG PO TABS
30.0000 mg | ORAL_TABLET | Freq: Every day | ORAL | Status: DC
  Administered 2011-05-31 – 2011-06-01 (×2): 30 mg via ORAL
  Filled 2011-05-30 (×3): qty 1

## 2011-05-30 MED ORDER — DILTIAZEM HCL ER COATED BEADS 300 MG PO CP24
300.0000 mg | ORAL_CAPSULE | Freq: Every day | ORAL | Status: DC
  Administered 2011-05-30 – 2011-06-04 (×6): 300 mg via ORAL
  Filled 2011-05-30 (×6): qty 1

## 2011-05-30 MED ORDER — AMIODARONE HCL 200 MG PO TABS
400.0000 mg | ORAL_TABLET | Freq: Two times a day (BID) | ORAL | Status: DC
  Administered 2011-05-30 – 2011-06-04 (×11): 400 mg via ORAL
  Filled 2011-05-30 (×12): qty 2

## 2011-05-30 MED ORDER — CLONAZEPAM 0.5 MG PO TABS
0.5000 mg | ORAL_TABLET | Freq: Two times a day (BID) | ORAL | Status: DC
  Administered 2011-05-30 – 2011-06-02 (×7): 0.5 mg via ORAL
  Filled 2011-05-30 (×7): qty 1

## 2011-05-30 NOTE — Progress Notes (Signed)
Nutrition Follow-up  Patient now in ICU due to difficulty breathing; receiving oxygen via venturi mask.  Palliative Care Team following; patient desires full aggressive care.  Diet Order:  Heart Healthy with Glucerna Shake PO BID.  PO's:  25-50% meal completion  Meds: Scheduled Meds:   . ALPRAZolam  0.5 mg Oral NOW  . aspirin  81 mg Oral Daily  . atorvastatin  40 mg Oral q1800  . budesonide  0.5 mg Nebulization BID  . clonazePAM  0.5 mg Oral BID  . clopidogrel  75 mg Per Tube Daily  . digoxin  0.25 mg Oral Daily  . diltiazem  300 mg Oral Daily  . feeding supplement  237 mL Oral BID BM  . insulin aspart  0-9 Units Subcutaneous TID WC  . isosorbide mononitrate  30 mg Oral Daily  . levalbuterol  0.63 mg Nebulization Q6H  . pantoprazole  40 mg Oral Q1200  . predniSONE  30 mg Oral Q breakfast  . Travoprost (BAK Free)  1 drop Both Eyes QHS  . white petrolatum      . DISCONTD: ALPRAZolam  1 mg Oral Daily  . DISCONTD: ALPRAZolam  1 mg Oral Daily  . DISCONTD: diltiazem  240 mg Oral Daily  . DISCONTD: hydrALAZINE  25 mg Oral Q8H  . DISCONTD: ipratropium  0.5 mg Nebulization Q6H  . DISCONTD: predniSONE  40 mg Oral Q breakfast   Continuous Infusions:  PRN Meds:.acetaminophen, ALPRAZolam, levalbuterol, ondansetron (ZOFRAN) IV, DISCONTD:  morphine injection  Labs:  CMP     Component Value Date/Time   NA 140 05/29/2011 0400   K 4.7 05/29/2011 0400   CL 94* 05/29/2011 0400   CO2 39* 05/29/2011 0400   GLUCOSE 115* 05/29/2011 0400   BUN 24* 05/29/2011 0400   CREATININE 0.40* 05/29/2011 0400   CALCIUM 8.9 05/29/2011 0400   PROT 5.2* 05/21/2011 0415   ALBUMIN 3.0* 05/21/2011 0415   AST 14 05/21/2011 0415   ALT 15 05/21/2011 0415   ALKPHOS 39 05/21/2011 0415   BILITOT 0.5 05/21/2011 0415   GFRNONAA >90 05/29/2011 0400   GFRAA >90 05/29/2011 0400   CBG (last 3)   Basename 05/30/11 0739 05/29/11 2154 05/29/11 1800  GLUCAP 90 125* 141*     Intake/Output Summary (Last 24 hours) at 05/30/11 1021 Last  data filed at 05/30/11 1000  Gross per 24 hour  Intake    580 ml  Output   1700 ml  Net  -1120 ml    Weight Status:  71.2 kg (down slightly from 73 kg 4/1)  Re-estimated needs:  1650-1850 kcals, 85-95 grams protein daily  Nutrition Dx:  Inadequate oral intake ongoing.  Goal:  Oral intake to meet >90% of estimated nutrition needs, unmet.  Intervention:  Increase Glucerna Shake to TID to maximize oral intake.  Monitor:  PO intake, labs, weight trend.   Hettie Holstein Pager #:  [phone]

## 2011-05-30 NOTE — Progress Notes (Signed)
Subjective:  The patient was moved back to 2100 yesterday because of pulmonary issues.  No chest pain.  She did have recurrence of PSVT yesterday morning which broke with carotid sinus massage.   Objective:  Vital Signs in the last 24 hours: Temp:  [97.7 F (36.5 C)-98.1 F (36.7 C)] 98.1 F (36.7 C) (04/04 0400) Pulse Rate:  [60-154] 60  (04/04 0700) Resp:  [10-21] 10  (04/04 0700) BP: (104-140)/(45-110) 125/51 mmHg (04/04 0700) SpO2:  [83 %-100 %] 94 % (04/04 0700) FiO2 (%):  [35 %-100 %] 40 % (04/04 0700) Weight:  [71.2 kg (156 lb 15.5 oz)] 71.2 kg (156 lb 15.5 oz) (04/04 0500)  Intake/Output from previous day: 04/03 0701 - 04/04 0700 In: 480 [P.O.:480] Out: 2450 [Urine:2450] Intake/Output from this shift:       . adenosine      . ALPRAZolam  0.5 mg Oral NOW  . ALPRAZolam  1 mg Oral Daily  . aspirin  81 mg Oral Daily  . atorvastatin  40 mg Oral q1800  . budesonide  0.5 mg Nebulization BID  . clopidogrel  75 mg Per Tube Daily  . digoxin  0.25 mg Oral Daily  . diltiazem  240 mg Oral Daily  . feeding supplement  237 mL Oral BID BM  . hydrALAZINE  25 mg Oral Q8H  . insulin aspart  0-9 Units Subcutaneous TID WC  . isosorbide mononitrate  30 mg Oral Daily  . levalbuterol  0.63 mg Nebulization Q6H  . pantoprazole  40 mg Oral Q1200  . predniSONE  40 mg Oral Q breakfast  . Travoprost (BAK Free)  1 drop Both Eyes QHS  . white petrolatum      . DISCONTD: ALPRAZolam  1 mg Oral Daily  . DISCONTD: ipratropium  0.5 mg Nebulization Q6H      Physical Exam: The patient appears to be in no distress.  Head and neck exam reveals that the pupils are equal and reactive.  The extraocular movements are full.  There is no scleral icterus.  Mouth and pharynx are benign.  No lymphadenopathy.  No carotid bruits.  The jugular venous pressure is normal.  Thyroid is not enlarged or tender.  Chest is clear to percussion and auscultation.  No rales or rhonchi.  Expansion of the chest is  symmetrical.  Heart reveals no abnormal lift or heave.  First and second heart sounds are normal.  There is no murmur gallop rub or click.  The abdomen is soft and nontender.  Bowel sounds are normoactive.  There is no hepatosplenomegaly or mass.  There are no abdominal bruits.  Extremities reveal no phlebitis or edema.  Pedal pulses are good.  There is no cyanosis or clubbing.  Neurologic exam is normal strength and no lateralizing weakness.  No sensory deficits.  Integument reveals no rash  Lab Results:  Basename 05/29/11 0400  WBC 16.8*  HGB 13.5  PLT 158    Basename 05/29/11 0400  NA 140  K 4.7  CL 94*  CO2 39*  GLUCOSE 115*  BUN 24*  CREATININE 0.40*   No results found for this basename: TROPONINI:2,CK,MB:2 in the last 72 hours Hepatic Function Panel No results found for this basename: PROT,ALBUMIN,AST,ALT,ALKPHOS,BILITOT,BILIDIR,IBILI in the last 72 hours No results found for this basename: CHOL in the last 72 hours No results found for this basename: PROTIME in the last 72 hours  Imaging: Dg Chest Port 1 View  05/28/2011  *RADIOLOGY REPORT*  Clinical Data: Shortness  of breath  PORTABLE CHEST - 1 VIEW  Comparison: 05/26/2011  Findings: Shallow inspiration with elevation of the left hemidiaphragm.  Infiltration or atelectasis in the left lung base appears stable since previous study.  Emphysematous changes in the lungs.  Scattered interstitial changes likely due to fibrosis. Calcification of the aorta.  No pneumothorax.  No blunting of costophrenic angles.  Old right rib fractures.  IMPRESSION: Stable appearance of infiltration or atelectasis in the left lung base.  Emphysematous changes and fibrosis in the lungs.  Original Report Authenticated By: Marlon Pel, M.D.    Cardiac Studies: Telemetry shows NSR Assessment/Plan:  Patient Active Hospital Problem List:  CAD (coronary artery disease) (05/23/2011)   Assessment: No chest pain.   Plan: Continue ASA,  statin, Imdur, plavix SVT (supraventricular tachycardia) (05/24/2011)   Assessment: Brief recurrence yesterday while in respiratory distress, resolved with Valsalva.   Plan: Continue digoxin, will increase cardizem to 300 mg daily to try to decrease SVT episodes.    LOS: 15 days    Cassell Clement 05/30/2011, 8:00 AM

## 2011-05-30 NOTE — Progress Notes (Signed)
Name: [name] [name] MRN: [id number] DOB: May 23, 1940    LOS: 15  PCCM Progress NOTE  Subjective: patient is feeling better this am.  No acute events overnight.    Vital Signs: Temp:  [97.6 F (36.4 C)-98.1 F (36.7 C)] 97.6 F (36.4 C) (04/04 0803) Pulse Rate:  [60-154] 60  (04/04 0700) Resp:  [10-21] 10  (04/04 0700) BP: (104-140)/(45-110) 125/51 mmHg (04/04 0700) SpO2:  [83 %-100 %] 94 % (04/04 0810) FiO2 (%):  [35 %-100 %] 40 % (04/04 0810) Weight:  [156 lb 15.5 oz (71.2 kg)] 156 lb 15.5 oz (71.2 kg) (04/04 0500) I/O last 3 completed shifts: In: 730 [P.O.:720; I.V.:10] Out: 2850 [Urine:2850]  Lines, Tubes, etc:  OETT 3/20>>>3/22  ETT 3/24>>>3/27  Left IJ CVL 3/20>>> 3/29  Foley cath 4/3>>>  Microbiology:  Sputum 3/20>>>NF  BCx2 3/20>>>neg  UC 3/20>>>neg  MRSA PCR>>>neg   Antibiotics:  Rocephin 3/20>>>3/28  azithro 3/20>>>3/25   Studies/Events:  Echo 3/20>>> EF 65-70% LVH   Consults: none  Physical Examination:  Vital signs reviewed.  GEN: No apparent distress. Alert and oriented x 3. Pleasant, conversant, and cooperative to exam.  HEENT: head is autraumatic and normocephalic. Neck is supple without palpable masses or lymphadenopathy. No JVD or carotid bruits. Vision intact. EOMI. PERRLA. Sclerae anicteric. Conjunctivae without pallor or injection. Mucous membranes are dry. Oropharynx is without erythema, exudates, or other abnormal lesions.  RESP: Lungs are clear to ascultation bilaterally with diminshed air movement. Very mild diffuse expiratory wheezing bilaterally; no ronchi, or rubs. Prolonged expiratory phase noted.  CARDIOVASCULAR: regular rate, normal rhythm. Clear S1, S2, no murmurs, gallops, or rubs.  ABDOMEN: soft, non-tender, non-distended. Bowels sounds present in all quadrants and normoactive. No palpable masses.  EXT: warm and dry. Peripheral pulses equal, intact, and +2 globally. No clubbing or cyanosis. +1 edema in bilateral lower extremities.  SKIN:  warm and dry with normal turgor. No rashes or abnormal lesions observed.  NEURO: Alert, oriented, thought content appropriate. Speech fluent without evidence of aphasia. Able to follow 3 step commands without difficulty. CN 2-12 grossly intact. No focal deficit   HOSPITAL MEDICATIONS:  I have reviewed the patient's current medications.   Lab Results: Basic Metabolic Panel:  Lab 05/29/11 4098 05/27/11 0540 05/24/11 0603  NA 140 141 --  K 4.7 4.1 --  CL 94* 97 --  CO2 39* 40* --  GLUCOSE 115* 103* --  BUN 24* 20 --  CREATININE 0.40* 0.46* --  CALCIUM 8.9 8.5 --  MG -- -- 2.1  PHOS -- -- 5.1*   CBC:  Lab 05/29/11 0400 05/25/11 0500  WBC 16.8* 15.9*  NEUTROABS -- 14.5*  HGB 13.5 14.0  HCT 41.3 42.6  MCV 96.9 97.7  PLT 158 154   CBG:  Lab 05/30/11 0739 05/29/11 2154 05/29/11 1800 05/29/11 1111 05/29/11 0904 05/28/11 2137  GLUCAP 90 125* 141* 100* 81 138*   Urinalysis:  Lab 05/26/11 1645  COLORURINE YELLOW  LABSPEC 1.011  PHURINE 6.5  GLUCOSEU NEGATIVE  HGBUR NEGATIVE  BILIRUBINUR NEGATIVE  KETONESUR NEGATIVE  PROTEINUR NEGATIVE  UROBILINOGEN 1.0  NITRITE NEGATIVE  LEUKOCYTESUR NEGATIVE   Imaging:  05/28/11: CXR, portable: unchanged left lower lobe infiltrate with possible effusion   Assessment and Plan:  Acute exacerbation of chronic respiratory failure/COPD: Patient is improved. She remains afebrile and hemodynamically stable and is able to maintain adequate oxygenation with venturi mask.  Anxiety is a significant issue that provokes dyspneic episodes and also exacerbates her underlying COPD.  -  Continue with BiPap prn  - Will continue xopenex nebs as well as prednisone taper  - WIll d/c atrovent as discussed below   Urinary retention: patient unable to void yesterday.  Bladder scan performed overnight revealed > retained urine; foley cath placed.   Anticholinergic effects of atrovent are likely a cause/contributing factor to pts urinary retention.   Patient is also still receiving morphine for pain control and is also on hydralazine for management of HTN; both could be contributing to urinary retention. - Leave foley in place for 24hr and plan for voiding trial tomorrow am - Will d/c morphine and hydralazine  SVT: Review of telemetry reveals no additional episodes of SVT or other arrythmia.   - Appreciate cardiology input and assistance with patient  - Continue telemetry  - Continue to use vagal maneuvers and ACLS protocal (adenosine, etc) as needed  - Continue cardiazem and dig per cards recs   Anxiety: Informed this am that extended release xanax is not available.  Although patient improved with 1mg  immediate release xanax, she would benefit from long-acting benzo.  Will start with BID clonazepam. - Continue with current regimen   Dispo: transfer to SDU pending.  Best Practice:  RUE:AVWU  SUP: not indicated  Nutrition: heart healthy diet  Glycemic control: insulin  Sedation/analgesia: morphine prn  MILLS,KRISTIN 05/30/2011, 8:59 AM   Pt seen and examined and database reviewed. I agree with above findings, assessment and plan  Billy Fischer, MD;  PCCM service; Mobile [phone]

## 2011-05-30 NOTE — Progress Notes (Signed)
Patient continues to have urinary retention with bladder scan showing >999cc urine at 11:00 PM 4/3 and again at 5.30 AM on 4/4. In and out catheterization was done at 11.00Pm to relieve retension. Atrovent was discontinued in AM to reduce anticholinergic effects on bladder. May consider flomax. I will put a foley catheter at this time for continued retention (3 episodes of retention in last 24 hours) of urine and let the morning team decide about the further plan of action.   Lars Mage MD [facility] Resident Pager [phone]

## 2011-05-30 NOTE — Consult Note (Signed)
I have reviewed and discussed the care of this patient in detail with the nurse practitioner including pertinent patient records, physical exam findings and data. I agree with details of this encounter.  

## 2011-05-30 NOTE — Significant Event (Signed)
Patient experienced recurrent  SVT (HR~130) this am that aborted with valsalva.  Given the frequency of these recurrences, will keep her in the ICU for now and cancel transfer to SDU.    Discussed further management of her SVT with Dr. Patty Sermons (cardiology).  Will add amiodarone starting at 400mg  po BID to improve control of her SVT.  Although she has significant underlying obstructive lung disease, she is not at increased risk for developing pulmonary toxicity as a result of amiodarone (relative to pts without COPD); will monitor her pulmonary status closely and ensure addition of amiodarone is communicated to her outpatient pulmonologist.  Nelda Bucks HO3  Pt seen and examined and database reviewed. I agree with above findings, assessment and plan  Billy Fischer, MD;  PCCM service; Mobile [phone]

## 2011-05-31 DIAGNOSIS — F419 Anxiety disorder, unspecified: Secondary | ICD-10-CM | POA: Diagnosis present

## 2011-05-31 DIAGNOSIS — Z9229 Personal history of other drug therapy: Secondary | ICD-10-CM

## 2011-05-31 LAB — GLUCOSE, CAPILLARY
Glucose-Capillary: 94 mg/dL (ref 70–99)
Glucose-Capillary: 85 mg/dL (ref 70–99)
Glucose-Capillary: 125 mg/dL — ABNORMAL HIGH (ref 70–99)
Glucose-Capillary: 122 mg/dL — ABNORMAL HIGH (ref 70–99)
Glucose-Capillary: 111 mg/dL — ABNORMAL HIGH (ref 70–99)

## 2011-05-31 MED ORDER — IBUPROFEN 200 MG PO TABS
200.0000 mg | ORAL_TABLET | Freq: Once | ORAL | Status: DC
  Filled 2011-05-31: qty 1

## 2011-05-31 MED ORDER — SODIUM CHLORIDE 0.9 % IJ SOLN: 10.0000 mL | INTRAMUSCULAR | Status: DC | PRN

## 2011-05-31 MED ORDER — SODIUM CHLORIDE 0.9 % IJ SOLN
10.0000 mL | Freq: Two times a day (BID) | INTRAMUSCULAR | Status: DC
  Administered 2011-05-31: 10 mL
  Administered 2011-06-01: 3 mL

## 2011-05-31 MED ORDER — DIGOXIN 125 MCG PO TABS
0.1250 mg | ORAL_TABLET | Freq: Every day | ORAL | Status: DC
  Administered 2011-06-01 – 2011-06-04 (×4): 0.125 mg via ORAL
  Filled 2011-05-31 (×4): qty 1

## 2011-05-31 MED ORDER — ENOXAPARIN SODIUM 40 MG/0.4ML ~~LOC~~ SOLN
40.0000 mg | SUBCUTANEOUS | Status: DC
  Administered 2011-05-31 – 2011-06-01 (×2): 40 mg via SUBCUTANEOUS
  Filled 2011-05-31 (×2): qty 0.4

## 2011-05-31 NOTE — Progress Notes (Signed)
Physical Therapy Treatment Patient Details Name: [name] [name] MRN: [id number] DOB: 1940/07/30 Today's Date: 05/31/2011  PT Assessment/Plan  PT - Assessment/Plan Comments on Treatment Session: Spent a lot of time today trying to gain trust from patient as she is very anxious and believes that we pushed her too hard last session. Not agreeable to any out of bed activity, even sitting on the EOB. Gave pt the option to sit in chair position of bed, patient initially agreeable but then reports that we did too much exercise and she couldn't do it anymore. Educated pt on the benefits for out of bed activity but pt doesn't think she can. She agrees to sit in chair position later today. Patient most limited by anxiety at this point. Will continue to progress as pt is agreeable as I do not think that forcing her to do anything will be very effective at this point.  PT Plan: Discharge plan remains appropriate;Frequency remains appropriate Follow Up Recommendations: Skilled nursing facility Equipment Recommended: Defer to next venue PT Goals  Acute Rehab PT Goals PT Goal: Supine/Side to Sit - Progress: Not progressing PT Goal: Sit to Supine/Side - Progress: Not progressing PT Goal: Sit to Stand - Progress: Not progressing PT Goal: Stand to Sit - Progress: Not progressing PT Transfer Goal: Bed to Chair/Chair to Bed - Progress: Not progressing PT Goal: Ambulate - Progress: Not progressing PT Goal: Up/Down Stairs - Progress: Discontinued (comment) (pt not even getting out of bed, stairs not relevant now) PT Goal: Perform Home Exercise Program - Progress: Progressing toward goal  PT Treatment Precautions/Restrictions  Precautions Precautions: Fall Precaution Comments: very anxious Required Braces or Orthoses: No Restrictions Weight Bearing Restrictions: No Mobility (including Balance) Bed Mobility Bed Mobility: No (pt only agreeable to bed exercises today)    Exercise  General Exercises - Lower  Extremity Ankle Circles/Pumps: AROM;Both;Other reps (comment);Supine (minimal resistance for PF from PT (30 reps)) Heel Slides: AROM;Both;20 reps;Supine Hip ABduction/ADduction: AROM;Both;20 reps;Supine Straight Leg Raises: AROM;Both;10 reps;Supine Other Exercises Other Exercises: active assisted ceiling punches x10 bilaterally  End of Session PT - End of Session Activity Tolerance: Patient limited by fatigue;Other (comment) (anxiety) Patient left: in bed;with call bell in reach General Behavior During Session: Agitated (believes that therapy is pushing her too hard) Cognition: WFL for tasks performed  [facility] HELEN 05/31/2011, 2:06 PM

## 2011-05-31 NOTE — Progress Notes (Signed)
Patient ID: [name] [name], female   DOB: 11-14-1940, 71 y.o.   MRN: [id number]   SUBJECTIVE:  The patient had some recurrent SVT yesterday. There was communication between pulmonary and cardiology and it was felt that amiodarone could be used. She was started on 400 mg daily. In addition she is on diltiazem. She's not had any recurrences since yesterday.   Filed Vitals:   05/31/11 0500 05/31/11 0600 05/31/11 0700 05/31/11 0800  BP: 107/47 132/52 140/66   Pulse: 62 65 57   Temp:    97.5 F (36.4 C)  TempSrc:    Oral  Resp: 16 17 13    Height:      Weight:      SpO2: 93% 95% 96%     Intake/Output Summary (Last 24 hours) at 05/31/11 0925 Last data filed at 05/31/11 0600  Gross per 24 hour  Intake    200 ml  Output   1505 ml  Net  -1305 ml    LABS: Basic Metabolic Panel:  Basename 05/29/11 0400  NA 140  K 4.7  CL 94*  CO2 39*  GLUCOSE 115*  BUN 24*  CREATININE 0.40*  CALCIUM 8.9  MG --  PHOS --   Liver Function Tests: No results found for this basename: AST:2,ALT:2,ALKPHOS:2,BILITOT:2,PROT:2,ALBUMIN:2 in the last 72 hours No results found for this basename: LIPASE:2,AMYLASE:2 in the last 72 hours CBC:  Basename 05/29/11 0400  WBC 16.8*  NEUTROABS --  HGB 13.5  HCT 41.3  MCV 96.9  PLT 158   Cardiac Enzymes: No results found for this basename: CKTOTAL:3,CKMB:3,CKMBINDEX:3,TROPONINI:3 in the last 72 hours BNP: No components found with this basename: POCBNP:3 D-Dimer: No results found for this basename: DDIMER:2 in the last 72 hours Hemoglobin A1C: No results found for this basename: HGBA1C in the last 72 hours Fasting Lipid Panel: No results found for this basename: CHOL,HDL,LDLCALC,TRIG,CHOLHDL,LDLDIRECT in the last 72 hours Thyroid Function Tests: No results found for this basename: TSH,T4TOTAL,FREET3,T3FREE,THYROIDAB in the last 72 hours  RADIOLOGY: Dg Chest Port 1 View  05/28/2011  *RADIOLOGY REPORT*  Clinical Data: Shortness of breath  PORTABLE CHEST - 1  VIEW  Comparison: 05/26/2011  Findings: Shallow inspiration with elevation of the left hemidiaphragm.  Infiltration or atelectasis in the left lung base appears stable since previous study.  Emphysematous changes in the lungs.  Scattered interstitial changes likely due to fibrosis. Calcification of the aorta.  No pneumothorax.  No blunting of costophrenic angles.  Old right rib fractures.  IMPRESSION: Stable appearance of infiltration or atelectasis in the left lung base.  Emphysematous changes and fibrosis in the lungs.  Original Report Authenticated By: Marlon Pel, M.D.   Dg Chest Port 1 View  05/26/2011  *RADIOLOGY REPORT*  Clinical Data: COPD and leukocytosis  PORTABLE CHEST - 1 VIEW  Comparison: 05/23/2011  Findings: Heart size appears normal.  There are low lung volumes.  Coarsened interstitial markings are noted bilaterally.  There is been slight interval improvement in aeration to the left base.  No change in right base opacities.  IMPRESSION:  1.  Mild improvement in aeration to the right lung base.  Original Report Authenticated By: Rosealee Albee, M.D.   Dg Chest Port 1 View  05/23/2011  *RADIOLOGY REPORT*  Clinical Data: Hilar prominence.  PORTABLE CHEST - 1 VIEW  Comparison: 05/22/2011  Findings: There is hyperinflation of the lungs compatible with COPD.  Cardiomegaly.  Left base atelectasis or infiltrate.  Minimal right base atelectasis.  Mild bilateral hilar prominence  likely is vascular, presumably pulmonary arterial hypertension.  No real change since prior study.  Interval removal of NG tube.  IMPRESSION: No significant change.  Original Report Authenticated By: Cyndie Chime, M.D.   Dg Chest Port 1 View  05/22/2011  *RADIOLOGY REPORT*  Clinical Data: Intubated.  PORTABLE CHEST - 1 VIEW  Comparison: 05/21/2011.  Findings: Endotracheal tube is in satisfactory position. Nasogastric tube is followed into the stomach.  Left IJ central line tip projects over the SVC, when patient  rotation is considered and comparison is made with 05/21/2011.  Heart size stable.  Bibasilar air space disease, left greater than right, with probable bilateral pleural effusions.  IMPRESSION: Persistent bibasilar air space disease and probable small bilateral pleural effusions.  Original Report Authenticated By: Reyes Ivan, M.D.   Dg Chest Port 1 View  05/21/2011  *RADIOLOGY REPORT*  Clinical Data: Evaluate endotracheal tube positioning  PORTABLE CHEST - 1 VIEW  Comparison: 05/20/2011; 05/19/2011; 05/18/2011  Findings:  Grossly unchanged borderline enlarged cardiac silhouette and mediastinal contours.  Stable position of support apparatus.  No pneumothorax.  Grossly unchanged blunting of bilateral costophrenic angles likely due to small bilateral effusions, right greater than left.  Grossly unchanged bilateral mid and lower lung heterogeneous air space opacities. Grossly unchanged bones.  IMPRESSION: 1.  Stable positioning of support apparatus.  No pneumothorax. 2.  Grossly unchanged small bilateral effusions and bilateral mid and lower lung heterogeneous opacities, atelectasis versus infiltrate.  Original Report Authenticated By: Waynard Reeds, M.D.   Dg Chest Port 1 View  05/20/2011  *RADIOLOGY REPORT*  Clinical Data: Evaluate endotracheal tube.  PORTABLE CHEST - 1 VIEW  Comparison: 05/19/2011  Findings: Endotracheal tube is 3.0 cm above the carina.  Stable position of the left jugular central line in the left innominate vein region.  Nasogastric tube extends into the abdomen.  There appears to be slightly improved aeration at the right lung base. Prominent interstitial markings particularly at the lung bases. Heart size is upper limits of normal.  No evidence for a pneumothorax. Focal density at the left lung base could represent atelectasis and/or small airspace disease.  IMPRESSION: Persistent basilar densities with prominent interstitial markings. Findings could represent dependent edema.   Cannot exclude small effusions.  Original Report Authenticated By: Richarda Overlie, M.D.   Dg Chest Port 1 View  05/19/2011  *RADIOLOGY REPORT*  Clinical Data: Evaluate endotracheal tube placement.  Hypertension. Hyperlipidemia.  COPD.  PORTABLE CHEST - 1 VIEW  Comparison: 1 day prior  Findings: Endotracheal tube unchanged, 4.7 cm above carina. Nasogastric tube extends beyond the  inferior aspect of the film. Left IJ central line unchanged.  Mild cardiomegaly.  Costophrenic angles partially excluded. Probable small bilateral pleural effusions versus pleural thickening. No pneumothorax.  Hyperinflation.  Worsened right and similar left base air space disease.  IMPRESSION:  1.  Slight worsening in right and similar left base air space disease.  Favor atelectasis. 2.  Small bilateral pleural effusions are suspected. 3.  Stable support apparatus.  Original Report Authenticated By: Consuello Bossier, M.D.   Dg Chest Port 1 View  05/18/2011  *RADIOLOGY REPORT*  Clinical Data: Endotracheal tube placement.  PORTABLE CHEST - 1 VIEW  Comparison: 05/17/2011  Findings: Endotracheal tube is appropriately positioned, 4.5 cm above carina.  Left IJ central line tip at mid SVC.  Nasogastric tube extends beyond the  inferior aspect of the film.  Remote right rib trauma.  Underlying hyperinflation.  Support apparatus artifact projects over the  left apex laterally.  Mild cardiomegaly.  Mild right hemidiaphragm elevation.  Trace right pleural fluid thickening. No pneumothorax.  Patchy bibasilar airspace disease.  Greater left than right.  IMPRESSION:  1.  Appropriate position of endotracheal tube. 2.  Similar right pleural fluid thickening with right hemidiaphragm elevation. 3.  Left greater than right bibasilar airspace disease, likely atelectasis.  Developing left base infection cannot be excluded.  Original Report Authenticated By: Consuello Bossier, M.D.   Dg Chest Port 1 View  05/17/2011  *RADIOLOGY REPORT*  Clinical Data: Evaluate  endotracheal tube position  PORTABLE CHEST - 1 VIEW  Comparison: Portable chest x-ray of 05/15/2011  Findings: The tip of the endotracheal tube is very difficult to visualize on the current film, but is approximately 6.8 cm above the carina.  Right basilar opacity remains consistent with atelectasis, possible small effusion and scarring.  Cardiomegaly is stable.  Left IJ central venous line remains.  IMPRESSION: The tip of endotracheal tube is difficult to visualize, approximately 6.8 cm above the carina.  Original Report Authenticated By: Juline Patch, M.D.   Dg Chest Portable 1 View  05/15/2011  *RADIOLOGY REPORT*  Clinical Data: Central line placement.  PORTABLE CHEST - 1 VIEW  Comparison: 05/15/2011.  Findings: Endotracheal tube is in satisfactory position.  Left IJ central line tip projects in the region of the brachiocephalic vein junction.  No definite pneumothorax.  Heart size stable.  Scarring in the right costophrenic angle.  Lungs are otherwise grossly clear.  IMPRESSION: Left IJ central line placement without pneumothorax.  No acute findings.  Original Report Authenticated By: Reyes Ivan, M.D.   Dg Chest Portable 1 View  05/15/2011  *RADIOLOGY REPORT*  Clinical Data: Respiratory stress  PORTABLE CHEST - 1 VIEW  Comparison: Chest radiograph 09/26/2010  Findings: Endotracheal tube is 6 cm from carina.  Stable cardiac silhouette.  There is mild basilar atelectasis similar to prior. No pneumothorax.  Central venous congestion is present.  IMPRESSION:   Endotracheal tube in good position.  Original Report Authenticated By: Genevive Bi, M.D.    PHYSICAL EXAM   Patient's husband is in the room. The patient is oriented to person time and place. Affect is normal. Lung exam reveals scattered rhonchi. Cardiac exam reveals S1 and S2. The rhythm is regular. There no significant murmurs.   TELEMETRY: I have reviewed telemetry. There is normal sinus rhythm.   ASSESSMENT AND PLAN:   CAD  (coronary artery disease) Coronary disease is stable. No further workup at this time.   SVT (supraventricular tachycardia)   Amiodarone has now been added to the patient's medications for the treatment of supraventricular tachycardia.She is on diltiazem and digoxin. I will decrease the digoxin dose.   Hx of amiodarone therapy   Patient is tolerating the addition of oral amiodarone.    Willa Rough 05/31/2011 9:25 AM

## 2011-05-31 NOTE — Progress Notes (Signed)
Name: SERAYA JOBST MRN: [id number] DOB: 10-Jan-1941    LOS: 16  PCCM Progress NOTE     Subjective: Patient continues to improve.  States she is feeling well today.  No acute events overnight.  She has no concerns/complaints.  Vital Signs: Temp:  [97.3 F (36.3 C)-98.5 F (36.9 C)] 97.8 F (36.6 C) (04/05 1156) Pulse Rate:  [57-86] 81  (04/05 1400) Resp:  [12-29] 20  (04/05 1400) BP: (107-155)/(44-88) 130/44 mmHg (04/05 1400) SpO2:  [85 %-97 %] 90 % (04/05 1400) FiO2 (%):  [50 %] 50 % (04/05 0700) I/O last 3 completed shifts: In: 780 [P.O.:780] Out: 2880 [Urine:2880]  Lines, Tubes, etc:  OETT 3/20>>>3/22  ETT 3/24>>>3/27  Left IJ CVL 3/20>>> 3/29  Foley cath 4/3>>>   Microbiology:  Sputum 3/20>>>NF  BCx2 3/20>>>neg  UC 3/20>>>neg  MRSA PCR>>>neg   Antibiotics:  Rocephin 3/20>>>3/28  azithro 3/20>>>3/25   Studies/Events:  Echo 3/20>>> EF 65-70% LVH   Consults: cardiology  Physical Examination:  Vital signs reviewed.  GEN: No apparent distress. Alert and oriented x 3. Pleasant, conversant, and cooperative to exam.  HEENT: head is autraumatic and normocephalic. Neck is supple without palpable masses or lymphadenopathy. No JVD or carotid bruits. Vision intact. EOMI. PERRLA. Sclerae anicteric. Conjunctivae without pallor or injection. Mucous membranes are dry. Oropharynx is without erythema, exudates, or other abnormal lesions.  RESP: Lungs are clear to ascultation bilaterally with diminshed air movement. Very mild diffuse expiratory wheezing bilaterally; no ronchi, or rubs.  CARDIOVASCULAR: regular rate, normal rhythm. Clear S1, S2, no murmurs, gallops, or rubs.  ABDOMEN: soft, non-tender, non-distended. Bowels sounds present in all quadrants and normoactive. No palpable masses.  EXT: warm and dry. Peripheral pulses equal, intact, and +2 globally. No clubbing or cyanosis. +1 edema in bilateral lower extremities.  SKIN: warm and dry with normal turgor. No rashes or  abnormal lesions observed.  NEURO: Alert, oriented, thought content appropriate. Speech fluent without evidence of aphasia. Able to follow 3 step commands without difficulty. CN 2-12 grossly intact. No focal deficit   HOSPITAL MEDICATIONS:  I have reviewed the patient's current medications.   Lab Results: Basic Metabolic Panel:  Lab 05/29/11 9562 05/27/11 0540  NA 140 141  K 4.7 4.1  CL 94* 97  CO2 39* 40*  GLUCOSE 115* 103*  BUN 24* 20  CREATININE 0.40* 0.46*  CALCIUM 8.9 8.5  MG -- --  PHOS -- --   CBC:  Lab 05/29/11 0400 05/25/11 0500  WBC 16.8* 15.9*  NEUTROABS -- 14.5*  HGB 13.5 14.0  HCT 41.3 42.6  MCV 96.9 97.7  PLT 158 154   CBG:  Lab 05/31/11 1154 05/31/11 0757 05/31/11 0400 05/30/11 2354 05/30/11 2002 05/30/11 1620  GLUCAP 125* 85 94 132* 150* 158*   Urinalysis:  Lab 05/26/11 1645  COLORURINE YELLOW  LABSPEC 1.011  PHURINE 6.5  GLUCOSEU NEGATIVE  HGBUR NEGATIVE  BILIRUBINUR NEGATIVE  KETONESUR NEGATIVE  PROTEINUR NEGATIVE  UROBILINOGEN 1.0  NITRITE NEGATIVE  LEUKOCYTESUR NEGATIVE    Imaging:   05/28/11: CXR, portable: unchanged left lower lobe infiltrate with possible effusion   Assessment and Plan:  Acute exacerbation of chronic respiratory failure/COPD: Patient is improved. She remains afebrile and hemodynamically stable and is able to maintain adequate oxygenation with venturi mask. Anxiety is a significant issue that provokes dyspneic episodes and also exacerbates her underlying COPD.  - Continue with venturi and BiPap prn  - Will continue xopenex nebs as well as prednisone taper   Urinary  retention:. Anticholinergic effects of atrovent are likely a cause/contributing factor to pts urinary retention. Morphine and hydralazine also discontinued as both meds may have contributed to urinary retention. - Remove foley today for voiding trial - Continue to monitor  SVT: Review of telemetry reveals no additional episodes of SVT or other arrythmia  following addition of amiodarone. - Appreciate cardiology input and assistance with patient  - Continue telemetry  - Continue current medication regimen  Anxiety:- Continue with current regimen for BID clonazepam and prn xanax  Dispo: transfer to SDU today. Will also consult SW for SNF placement as well as PT for CIR placement.  Best Practice:  NFA:OZHY  SUP: not indicated  Nutrition: heart healthy diet  Glycemic control: insulin  Sedation/analgesia: tylenol   MILLS,KRISTIN 05/31/2011, 3:38 PM  Pt seen and examined and database reviewed. I agree with above findings, assessment and plan. Cont current Rx over WE including PRN NPPV. Begin to work on placement for [employer]. Remains on PCCM service  Billy Fischer, MD;  PCCM service; Mobile [phone]

## 2011-06-01 DIAGNOSIS — I472 Ventricular tachycardia, unspecified: Secondary | ICD-10-CM

## 2011-06-01 DIAGNOSIS — I2510 Atherosclerotic heart disease of native coronary artery without angina pectoris: Secondary | ICD-10-CM

## 2011-06-01 LAB — GLUCOSE, CAPILLARY
Glucose-Capillary: 117 mg/dL — ABNORMAL HIGH (ref 70–99)
Glucose-Capillary: 124 mg/dL — ABNORMAL HIGH (ref 70–99)
Glucose-Capillary: 69 mg/dL — ABNORMAL LOW (ref 70–99)
Glucose-Capillary: 94 mg/dL (ref 70–99)

## 2011-06-01 MED ORDER — PREDNISONE 20 MG PO TABS
20.0000 mg | ORAL_TABLET | Freq: Every day | ORAL | Status: DC
  Administered 2011-06-02 – 2011-06-03 (×2): 20 mg via ORAL
  Filled 2011-06-01 (×3): qty 1

## 2011-06-01 MED ORDER — PREDNISONE 20 MG PO TABS
30.0000 mg | ORAL_TABLET | Freq: Every day | ORAL | Status: DC
  Filled 2011-06-01: qty 1

## 2011-06-01 NOTE — Progress Notes (Signed)
Name: [name] [name] MRN: [id number] DOB: 06/16/1940    LOS: 17  PCCM Progress NOTE    70 YOWF (oxygen dependant 2.5 liters), presented via EMS on 3/20 w/ CC: not feeling well for 2-3 days was ambulatory upon EMS arrival, but patient's condition deteriorated in route. Upon arrival to the ED, she was nonverbal, and leaning forward, with expiratory wheezing. Patient was placed on BiPAP immediately. No reported fevers. No chest pains. Failed attempt at NIPPV and therefore intubated. She had ordered and given Solu-Medrol and multiple breathing treatments in route.   Lines, Tubes, etc:  OETT 3/20>>>3/22  ETT 3/24>>>3/27  Left IJ CVL 3/20>>> 3/29  Foley cath 4/3>>> 4/6  Microbiology:  Sputum 3/20>>>NF  BCx2 3/20>>>neg  UC 3/20>>>neg  MRSA PCR>>>neg   Antibiotics:  Rocephin 3/20>>>3/28  azithro 3/20>>>3/25   Studies/Events:  Echo 3/20>>> EF 65-70% LVH   Consults: cardiology  Subjective :  Asleep , Foley removed this am, decreased UOP since    Physical Examination:  Vital signs reviewed.  GEN: asleep   HEENT:  Dry mucosa   NECK:  Supple  ; no JVD;    RESP  Coarse BS   CARD:  RRR, no m/r/g  Tr edema   GI:   Soft & nt; nml bowel sounds; no organomegaly or masses detected.  Musco: Warm bil, no deformities or joint swelling noted.   Neuro: alert, no focal deficits noted.    Skin: Warm, no lesions or rashes    HOSPITAL MEDICATIONS:  I have reviewed the patient's current medications.   Lab Results: Basic Metabolic Panel:  Lab 05/29/11 0865 05/27/11 0540  NA 140 141  K 4.7 4.1  CL 94* 97  CO2 39* 40*  GLUCOSE 115* 103*  BUN 24* 20  CREATININE 0.40* 0.46*  CALCIUM 8.9 8.5  MG -- --  PHOS -- --   CBC:  Lab 05/29/11 0400  WBC 16.8*  NEUTROABS --  HGB 13.5  HCT 41.3  MCV 96.9  PLT 158   CBG:  Lab 06/01/11 1147 06/01/11 0757 06/01/11 0717 06/01/11 0411 06/01/11 0006 05/31/11 2007  GLUCAP 106* 77 69* 94 117* 111*   Urinalysis:  Lab 05/26/11 1645    COLORURINE YELLOW  LABSPEC 1.011  PHURINE 6.5  GLUCOSEU NEGATIVE  HGBUR NEGATIVE  BILIRUBINUR NEGATIVE  KETONESUR NEGATIVE  PROTEINUR NEGATIVE  UROBILINOGEN 1.0  NITRITE NEGATIVE  LEUKOCYTESUR NEGATIVE    Imaging:   05/28/11: CXR, portable: unchanged left lower lobe infiltrate with possible effusion   Assessment and Plan:  Acute exacerbation of chronic respiratory failure/COPD: Patient is improved. She remains afebrile and hemodynamically stable and is able to maintain adequate oxygenation with venturi mask. Anxiety is a significant issue that provokes dyspneic episodes and also exacerbates her underlying COPD.  - Continue with venturi and BiPap prn  - Will continue xopenex nebs as well as prednisone taper   Urinary retention:. Foley removed this am.  -check bladder for retention  May need to replace foley   SVT:    - Appreciate cardiology input and assistance with patient  - Continue telemetry  - Continue current medication regimen  Anxiety:- Continue with current regimen for BID clonazepam and prn xanax  Dispo: orders for  SDU today.   SW for SNF placement as well as PT for CIR placement.(orders done )   Best Practice:  HQI:ONGE  SUP: not indicated  Nutrition: heart healthy diet  Glycemic control: insulin  Sedation/analgesia: tylenol  PARRETT,TAMMY NP -C  06/01/2011, 2:30 PM  I have reviewed the above & discussed case w/ TParrett,NP. Agree in detail w/ her note & plans as outlined... Lonzo Cloud. Kriste Basque, MD

## 2011-06-01 NOTE — Progress Notes (Signed)
eLink Physician-Brief Progress Note Patient Name: [name] [name] DOB: Jul 18, 1940 MRN: [id number]  Date of Service  06/01/2011   HPI/Events of Note   Difficulty voiding  eICU Interventions  I&O cath q6 PRN   Intervention Category Minor Interventions: Routine modifications to care plan (e.g. PRN medications for pain, fever)  Elmus Mathes J 06/01/2011, 9:58 PM

## 2011-06-01 NOTE — Progress Notes (Signed)
   Subjective: No chest pain or palpitations. On facemask O2.   Objective: Temp:  [97.7 F (36.5 C)-98 F (36.7 C)] 97.8 F (36.6 C) (04/06 0741) Pulse Rate:  [61-81] 80  (04/06 0700) Resp:  [11-24] 22  (04/06 0700) BP: (115-164)/(40-93) 162/82 mmHg (04/06 0700) SpO2:  [85 %-100 %] 96 % (04/06 0844) FiO2 (%):  [50 %-100 %] 50 % (04/06 0844)  I/O last 3 completed shifts: In: 790 [P.O.:780; I.V.:10] Out: 3230 [Urine:3230]  Telemetry - Sinus rhythm with no recurrent SVT last 24 hours.  Exam -   General - Chronically ill appearing, NAD.  Lungs - Coarse, decreased breath sounds.  Cardiac - RRR, no gallop.  Extremities - No pitting.  Testing -   Lab Results  Component Value Date   WBC 16.8* 05/29/2011   HGB 13.5 05/29/2011   HCT 41.3 05/29/2011   MCV 96.9 05/29/2011   PLT 158 05/29/2011    Lab Results  Component Value Date   CREATININE 0.40* 05/29/2011   BUN 24* 05/29/2011   NA 140 05/29/2011   K 4.7 05/29/2011   CL 94* 05/29/2011   CO2 39* 05/29/2011    Lab Results  Component Value Date   CKTOTAL 62 05/21/2011   CKMB 1.8 05/21/2011   TROPONINI <0.30 05/21/2011    Current Medications    . amiodarone  400 mg Oral BID  . aspirin  81 mg Oral Daily  . atorvastatin  40 mg Oral q1800  . budesonide  0.5 mg Nebulization BID  . clonazePAM  0.5 mg Oral BID  . clopidogrel  75 mg Per Tube Daily  . digoxin  0.125 mg Oral Daily  . diltiazem  300 mg Oral Daily  . enoxaparin (LOVENOX) injection  40 mg Subcutaneous Q24H  . feeding supplement  237 mL Oral TID BM  . ibuprofen  200 mg Oral Once  . insulin aspart  0-9 Units Subcutaneous TID WC  . isosorbide mononitrate  30 mg Oral Daily  . levalbuterol  0.63 mg Nebulization Q6H  . pantoprazole  40 mg Oral Q1200  . predniSONE  30 mg Oral Q breakfast  . sodium chloride  10-40 mL Intracatheter Q12H  . Travoprost (BAK Free)  1 drop Both Eyes QHS    Assessment:  1. PSVT, better controlled with Amiodarone.  2. CAD, stable at this  point.  Plan:  Recheck ECG, CBC, BMET. Continue telemetry monitoring.   Jonelle Sidle, M.D., F.A.C.C.

## 2011-06-02 LAB — BASIC METABOLIC PANEL
CO2: 37 mEq/L — ABNORMAL HIGH (ref 19–32)
Calcium: 8.9 mg/dL (ref 8.4–10.5)
Glucose, Bld: 72 mg/dL (ref 70–99)
Sodium: 139 mEq/L (ref 135–145)

## 2011-06-02 LAB — GLUCOSE, CAPILLARY: Glucose-Capillary: 103 mg/dL — ABNORMAL HIGH (ref 70–99)

## 2011-06-02 LAB — CBC
HCT: 37.3 % (ref 36.0–46.0)
MCH: 32 pg (ref 26.0–34.0)
MCV: 97.1 fL (ref 78.0–100.0)
Platelets: 163 10*3/uL (ref 150–400)
RBC: 3.84 MIL/uL — ABNORMAL LOW (ref 3.87–5.11)

## 2011-06-02 MED ORDER — ACETAZOLAMIDE 250 MG PO TABS
500.0000 mg | ORAL_TABLET | Freq: Once | ORAL | Status: AC
  Administered 2011-06-02: 500 mg via ORAL
  Filled 2011-06-02: qty 2

## 2011-06-02 NOTE — Progress Notes (Signed)
Name: [name] [name] MRN: [id number] DOB: 1940-06-29    LOS: 18  PCCM Progress NOTE  Pt Profile/ History:  70 YOWF (oxygen dependant 2.5 liters), presented via EMS on 3/20 w/ CC: not feeling well for 2-3 days was ambulatory upon EMS arrival, but patient's condition deteriorated in route. Upon arrival to the ED, she was nonverbal, and leaning forward, with expiratory wheezing. Patient was placed on BiPAP immediately. No reported fevers. No chest pains. Failed attempt at NIPPV and therefore intubated. She had ordered and given Solu-Medrol and multiple breathing treatments in route.   Lines, Tubes, etc:  OETT 3/20>>>3/22  ETT 3/24>>>3/27  Left IJ CVL 3/20>>> 3/29  Foley cath 4/3>>> 4/6  Microbiology:  Sputum 3/20>>>NF  BCx2 3/20>>>neg  UC 3/20>>>neg  MRSA PCR>>>neg   Antibiotics:  Rocephin 3/20>>>3/28  azithro 3/20>>>3/25   Studies/Events:  Echo 3/20>>> EF 65-70% LVH   Consults: cardiology  Subjective :  Asleep; Foley removed this am, decreased UOP since==> I&O cath prn. 4/7>    Physical Examination:  Vital signs reviewed.  GEN: asleep  HEENT:  Dry mucosa  NECK:  Supple  ; no JVD;   RESP  Coarse BS  CARD:  RRR, no m/r/g  Tr edema  GI:   Soft & nt; nml bowel sounds; no organomegaly or masses detected. Musco: Warm bil, no deformities or joint swelling noted.  Neuro: alert, no focal deficits noted.   Skin: Warm, no lesions or rashes   HOSPITAL MEDICATIONS:     . amiodarone  400 mg Oral BID  . aspirin  81 mg Oral Daily  . atorvastatin  40 mg Oral q1800  . budesonide  0.5 mg Nebulization BID  . clonazePAM  0.5 mg Oral BID  . clopidogrel  75 mg Per Tube Daily  . digoxin  0.125 mg Oral Daily  . diltiazem  300 mg Oral Daily  . feeding supplement  237 mL Oral TID BM  . ibuprofen  200 mg Oral Once  . insulin aspart  0-9 Units Subcutaneous TID WC  . isosorbide mononitrate  30 mg Oral Daily  . levalbuterol  0.63 mg Nebulization Q6H  . pantoprazole  40 mg Oral Q1200  .  predniSONE  20 mg Oral Q breakfast  . Travoprost (BAK Free)  1 drop Both Eyes QHS  . DISCONTD: enoxaparin (LOVENOX) injection  40 mg Subcutaneous Q24H  . DISCONTD: predniSONE  30 mg Oral Q breakfast  . DISCONTD: predniSONE  30 mg Oral Q breakfast  . DISCONTD: sodium chloride  10-40 mL Intracatheter Q12H    Lab Results: Basic Metabolic Panel:  Lab 06/02/11 4098 05/29/11 0400  NA 139 140  K 3.9 4.7  CL 95* 94*  CO2 37* 39*  GLUCOSE 72 115*  BUN 22 24*  CREATININE 0.54 0.40*  CALCIUM 8.9 8.9  MG -- --  PHOS -- --   CBC:  Lab 06/02/11 0700 05/29/11 0400  WBC 12.5* 16.8*  NEUTROABS -- --  HGB 12.3 13.5  HCT 37.3 41.3  MCV 97.1 96.9  PLT 163 158   CBG:  Lab 06/02/11 0837 06/01/11 2126 06/01/11 1544 06/01/11 1147 06/01/11 0757 06/01/11 0717  GLUCAP 76 87 124* 106* 77 69*   Urinalysis:  Lab 05/26/11 1645  COLORURINE YELLOW  LABSPEC 1.011  PHURINE 6.5  GLUCOSEU NEGATIVE  HGBUR NEGATIVE  BILIRUBINUR NEGATIVE  KETONESUR NEGATIVE  PROTEINUR NEGATIVE  UROBILINOGEN 1.0  NITRITE NEGATIVE  LEUKOCYTESUR NEGATIVE    Imaging:   05/28/11: CXR, portable: unchanged left  lower lobe infiltrate with possible effusion    Assessment and Plan: Acute exacerbation of chronic respiratory failure/COPD: Patient is improved. She remains afebrile and hemodynamically stable and is able to maintain adequate oxygenation with venturi mask. She is very weak & sl lethargic. Anxiety is a significant issue that provokes dyspneic episodes and also exacerbates her underlying COPD.  - Continue with venturi and BiPap prn  - Will continue xopenex nebs as well as prednisone taper  - Encourage cough, deep breathing, IS, mobilization, etc. - Try additional diuresis w/ Diamox...  Urinary retention:. Foley out- some difficulty voiding; doing I&O cath- may need to replace foley.  SVT:    - Appreciate cardiology input and assistance with patient  - Continue telemetry  - Continue current medication  regimen==> still on Amio 400mg Bid.  Anxiety:- Continue with current regimen for BID clonazepam and prn xanax  Dispo:  Tx to [facility] on 4/6...  SW for SNF placement as well as PT for CIR placement.(orders done )   Best Practice:  YQM:VHQI  SUP: not indicated  Nutrition: heart healthy diet  Glycemic control: insulin  Sedation/analgesia: tylenol   Emerson Barretto M NP -C  06/02/2011, 10:45 AM

## 2011-06-02 NOTE — Progress Notes (Signed)
   Subjective: Rested better last night. No chest pain or palpitations.   Objective: Temp:  [97.5 F (36.4 C)-98.3 F (36.8 C)] 97.9 F (36.6 C) (04/07 0528) Pulse Rate:  [61-96] 69  (04/07 0528) Resp:  [13-22] 14  (04/07 0528) BP: (109-149)/(43-93) 122/48 mmHg (04/07 0528) SpO2:  [81 %-99 %] 91 % (04/07 0528) FiO2 (%):  [50 %] 50 % (04/07 0528) Weight:  [156 lb 8 oz (70.988 kg)-160 lb 4.4 oz (72.7 kg)] 160 lb 4.4 oz (72.7 kg) (04/07 0528)  I/O last 3 completed shifts: In: 970 [P.O.:960; I.V.:10] Out: 1875 [Urine:1875]  Telemetry - Sinus rhythm, no further SVT.  Exam -   General - Chronically ill appearing, NAD.   Lungs - Coarse, decreased breath sounds.   Cardiac - RRR, no gallop.   Extremities - No pitting.   Testing -   Lab Results  Component Value Date   WBC 12.5* 06/02/2011   HGB 12.3 06/02/2011   HCT 37.3 06/02/2011   MCV 97.1 06/02/2011   PLT 163 06/02/2011    Lab Results  Component Value Date   CREATININE 0.54 06/02/2011   BUN 22 06/02/2011   NA 139 06/02/2011   K 3.9 06/02/2011   CL 95* 06/02/2011   CO2 37* 06/02/2011    Lab Results  Component Value Date   CKTOTAL 62 05/21/2011   CKMB 1.8 05/21/2011   TROPONINI <0.30 05/21/2011    ECG - Normal sinus rhythm with vertical axis, nonspecific ST-T changes, QTC 359 milliseconds.   Current Medications    . amiodarone  400 mg Oral BID  . aspirin  81 mg Oral Daily  . atorvastatin  40 mg Oral q1800  . budesonide  0.5 mg Nebulization BID  . clonazePAM  0.5 mg Oral BID  . clopidogrel  75 mg Per Tube Daily  . digoxin  0.125 mg Oral Daily  . diltiazem  300 mg Oral Daily  . feeding supplement  237 mL Oral TID BM  . ibuprofen  200 mg Oral Once  . insulin aspart  0-9 Units Subcutaneous TID WC  . isosorbide mononitrate  30 mg Oral Daily  . levalbuterol  0.63 mg Nebulization Q6H  . pantoprazole  40 mg Oral Q1200  . predniSONE  20 mg Oral Q breakfast  . predniSONE  30 mg Oral Q breakfast  . Travoprost (BAK Free)  1  drop Both Eyes QHS  . DISCONTD: enoxaparin (LOVENOX) injection  40 mg Subcutaneous Q24H  . DISCONTD: predniSONE  30 mg Oral Q breakfast  . DISCONTD: sodium chloride  10-40 mL Intracatheter Q12H    Assessment:  1. PSVT, better controlled. Now on diltiazem, digoxin, and Amiodarone.   2. CAD, stable at this point. Can continue aspirin and Plavix as well as statin therapy.  Plan:  Further care per CCM team. Cardiac status has been stable through the weekend. I would anticipate that amiodarone should be a temporary medication, used as her pulmonary status stabilizes, and then ultimately discontinued.   Jonelle Sidle, M.D., F.A.C.C.

## 2011-06-03 DIAGNOSIS — J9600 Acute respiratory failure, unspecified whether with hypoxia or hypercapnia: Secondary | ICD-10-CM

## 2011-06-03 DIAGNOSIS — G92 Toxic encephalopathy: Secondary | ICD-10-CM

## 2011-06-03 DIAGNOSIS — R5381 Other malaise: Secondary | ICD-10-CM

## 2011-06-03 LAB — COMPREHENSIVE METABOLIC PANEL
ALT: 39 U/L — ABNORMAL HIGH (ref 0–35)
AST: 20 U/L (ref 0–37)
Albumin: 3.1 g/dL — ABNORMAL LOW (ref 3.5–5.2)
Alkaline Phosphatase: 53 U/L (ref 39–117)
BUN: 18 mg/dL (ref 6–23)
Chloride: 96 mEq/L (ref 96–112)
Potassium: 3.9 mEq/L (ref 3.5–5.1)
Sodium: 137 mEq/L (ref 135–145)
Total Bilirubin: 1 mg/dL (ref 0.3–1.2)

## 2011-06-03 MED ORDER — ALPRAZOLAM 0.25 MG PO TABS
0.2500 mg | ORAL_TABLET | Freq: Three times a day (TID) | ORAL | Status: DC | PRN
  Filled 2011-06-03: qty 1

## 2011-06-03 MED ORDER — PREDNISONE 5 MG PO TABS
15.0000 mg | ORAL_TABLET | Freq: Every day | ORAL | Status: AC
  Administered 2011-06-04: 15 mg via ORAL
  Filled 2011-06-03: qty 1

## 2011-06-03 NOTE — Progress Notes (Signed)
CSW reviewed chart and staffed pt, note CIR consult. CSW will continue to follow in background in the event SNF placement is needed.  Baxter Flattery, MSW [phone]

## 2011-06-03 NOTE — Progress Notes (Signed)
Patient ID: [name] [name], female   DOB: Nov 04, 1940, 71 y.o.   MRN: [id number]   Subjective: Rested better last night. No chest pain or palpitations. Nurse indicates she likes to have too much oxygen   Objective: Temp:  [96.9 F (36.1 C)-98.3 F (36.8 C)] 98.3 F (36.8 C) (04/08 1200) Pulse Rate:  [60-81] 77  (04/08 1200) Resp:  [12-23] 20  (04/08 1200) BP: (100-135)/(40-62) 103/47 mmHg (04/08 1200) SpO2:  [87 %-99 %] 90 % (04/08 1321) FiO2 (%):  [50 %] 50 % (04/08 0814) Weight:  [156 lb 8.4 oz (71 kg)] 156 lb 8.4 oz (71 kg) (04/08 0500)  I/O last 3 completed shifts: In: 1800 [P.O.:1800] Out: 2785 [Urine:2785]  Telemetry - Sinus rhythm, no further SVT.  Exam -   General - Chronically ill appearing, NAD.   Lungs - Coarse, decreased breath sounds.   Cardiac - RRR, no gallop.   Extremities - No pitting.   Testing -   Lab Results  Component Value Date   WBC 12.5* 06/02/2011   HGB 12.3 06/02/2011   HCT 37.3 06/02/2011   MCV 97.1 06/02/2011   PLT 163 06/02/2011    Lab Results  Component Value Date   CREATININE 0.53 06/03/2011   BUN 18 06/03/2011   NA 137 06/03/2011   K 3.9 06/03/2011   CL 96 06/03/2011   CO2 33* 06/03/2011    Lab Results  Component Value Date   CKTOTAL 62 05/21/2011   CKMB 1.8 05/21/2011   TROPONINI <0.30 05/21/2011    ECG - Normal sinus rhythm with vertical axis, nonspecific ST-T changes, QTC 359 milliseconds.   Current Medications    . acetaZOLAMIDE  500 mg Oral Once  . amiodarone  400 mg Oral BID  . aspirin  81 mg Oral Daily  . budesonide  0.5 mg Nebulization BID  . clopidogrel  75 mg Per Tube Daily  . digoxin  0.125 mg Oral Daily  . diltiazem  300 mg Oral Daily  . feeding supplement  237 mL Oral TID BM  . isosorbide mononitrate  30 mg Oral Daily  . levalbuterol  0.63 mg Nebulization Q6H  . predniSONE  15 mg Oral Q breakfast  . Travoprost (BAK Free)  1 drop Both Eyes QHS  . DISCONTD: atorvastatin  40 mg Oral q1800  . DISCONTD: clonazePAM  0.5 mg Oral  BID  . DISCONTD: ibuprofen  200 mg Oral Once  . DISCONTD: insulin aspart  0-9 Units Subcutaneous TID WC  . DISCONTD: pantoprazole  40 mg Oral Q1200  . DISCONTD: predniSONE  20 mg Oral Q breakfast    Assessment:  1. PSVT, Nonrecurrent. Now on diltiazem, digoxin, and Amiodarone.   2. CAD, stable at this point. Can continue aspirin and Plavix as well as statin therapy.  Plan:  Further care per CCM team.. I would anticipate that amiodarone should be a temporary medication, used as her pulmonary status stabilizes, and then ultimately discontinued.   Charlton Haws 1:46 PM 06/03/2011

## 2011-06-03 NOTE — Progress Notes (Signed)
Rehab admissions - Evaluated for possible admission.  I have called and faxed to [facility].  I have approval for inpatient rehab admit.  I will check in am to see if patient is medically ready for rehab.  Call me for questions.  Pager [phone]

## 2011-06-03 NOTE — Progress Notes (Signed)
OT Cancellation Note  Treatment cancelled, pt stating she is going to move to [facility] and wishes to do therapy after the move.  Will re-attempt as schedule allows.  Evern Bio 06/03/2011, 11:03 AM

## 2011-06-03 NOTE — Progress Notes (Signed)
Dr. Tyson Alias paged and made aware of patients sats decreased to 80's on nasal cannula and that venti mask 50% has been placed back on patient to maintain sats 90-92% Patient is asymptomatic. MD ok with continuing with transfer as long as patient is in no distress. Patient is not in any distress.

## 2011-06-03 NOTE — Consult Note (Signed)
Physical Medicine and Rehabilitation Consult  Reason for Consult: Deconditioning  Referring Phsyician:  Dr. Molli Knock    HPI: [name] [name] is an 71 y.o. female with COPD-oxygen dependant 2.5 liters, presented via EMS on 3/20 w/ CC: not feeling well for 2-3 days was ambulatory upon EMS arrival, but patient's condition deteriorated in route. Upon arrival to the ED, she was nonverbal, and leaning forward, with expiratory wheezing. Patient was placed on BiPAP immediately. No reported fevers. No chest pains. Failed attempt at NIPPV and therefore intubated. She had ordered and given Solu-Medrol and multiple breathing treatments in route. Treated with steroids and IV antibiotics for  Acute-on-chronic respiratory failure and acute exacerbation of COPD in setting of possible CAP. Extubated on 03/22. Did have issues with agitation due to encephalopathy/ anxiety.  Patient developed PSVT and started on digoxin, diltiazem and amiodarone for rate control. Therapies initiated and patient noted to be deconditioned and anxious- requiring encouragement for participation.  MD recommending CIR.   Review of Systems  Eyes: Negative for blurred vision.  Respiratory: Positive for cough, sputum production and shortness of breath (with activity.).   Gastrointestinal: Negative for heartburn, nausea and vomiting.  Musculoskeletal: Positive for back pain and joint pain (endstage DJD-bilateral knees.).  Neurological: Positive for weakness.  Psychiatric/Behavioral: The patient is nervous/anxious.    Past Medical History  Diagnosis Date  . Hypertension   . Hyperlipidemia   . Cerebrovascular disease, unspecified   . Unspecified glaucoma   . Obesity, unspecified   . COPD (chronic obstructive pulmonary disease)   . Coronary artery disease     cath 04/06/10: LAD occluded with R-L collats; med Rx WTC....probable V. Tach...med Rx  . Ventricular tachycardia   . SVT (supraventricular tachycardia)   . AAA (abdominal aortic aneurysm)     Past Surgical History  Procedure Date  . Cystoscopy   . Bladder tumor excision     resection of 5 bladder, and fulguration of 1 small bladder tumor  . Cystoscopy     cold cup bladder biopsy of five tumors, and fulguration of one tumor  . Lung surgery     Remote right lung  . Tubal ligation     Bilateral   Family History  Problem Relation Age of Onset  . Heart disease Mother 59  . Heart disease Father    Social History:  Married.  Independent but needed rest breaks with most activity PTA.  She reports that she quit smoking about 2 weeks ago. Her smoking use included Cigarettes. She has a 17.4 pack-year smoking history. She has never used smokeless tobacco. She reports that she does not drink alcohol or use illicit drugs. Husband supportive but open to [facility] or [facility] past discharge.  No Known Allergies  Prior to Admission medications   Medication Sig Start Date End Date Taking? Authorizing Provider  acetaminophen-codeine (TYLENOL #3) 300-30 MG per tablet Take 1 tablet by mouth 3 (three) times daily.     Historical Provider, MD  aspirin 81 MG EC tablet Take 81 mg by mouth daily.      Historical Provider, MD  Aspirin-Acetaminophen 500-325 MG PACK Take by mouth. (Goody Body Pain) as needed     Historical Provider, MD  atorvastatin (LIPITOR) 80 MG tablet Take 40 mg by mouth daily.     Historical Provider, MD  budesonide-formoterol (SYMBICORT) 80-4.5 MCG/ACT inhaler Inhale 2 puffs into the lungs 2 (two) times daily.      Historical Provider, MD  furosemide (LASIX) 20 MG tablet  Take 1 tablet by mouth daily as needed. For swelling 09/29/10   Historical Provider, MD  ipratropium-albuterol (DUONEB) 0.5-2.5 (3) MG/3ML SOLN Take 3 mLs by nebulization daily.      Historical Provider, MD  metoprolol (TOPROL-XL) 50 MG 24 hr tablet TAKE 1/2 TABLET TWICE DAILY 10/23/10   Lewayne Bunting, MD  nitroGLYCERIN (NITROSTAT) 0.4 MG SL tablet Place 0.4 mg under the tongue every 5 (five) minutes as  needed. Chest pain 07/10/10   Lewayne Bunting, MD  Oxygen-Helium 20-80 % KIT Inhale into the lungs. 2.5 Liters, use as needed with exertion     Historical Provider, MD  PLAVIX 75 MG tablet Take 1 tablet by mouth Daily. 10/08/10   Historical Provider, MD  potassium chloride SA (K-DUR,KLOR-CON) 20 MEQ tablet Take 20 mEq by mouth 2 (two) times daily.     Historical Provider, MD  travoprost, benzalkonium, (TRAVATAN) 0.004 % ophthalmic solution Place 1 drop into both eyes at bedtime.      Historical Provider, MD   Scheduled Medications:    . acetaZOLAMIDE  500 mg Oral Once  . amiodarone  400 mg Oral BID  . aspirin  81 mg Oral Daily  . atorvastatin  40 mg Oral q1800  . budesonide  0.5 mg Nebulization BID  . clonazePAM  0.5 mg Oral BID  . clopidogrel  75 mg Per Tube Daily  . digoxin  0.125 mg Oral Daily  . diltiazem  300 mg Oral Daily  . feeding supplement  237 mL Oral TID BM  . ibuprofen  200 mg Oral Once  . insulin aspart  0-9 Units Subcutaneous TID WC  . isosorbide mononitrate  30 mg Oral Daily  . levalbuterol  0.63 mg Nebulization Q6H  . pantoprazole  40 mg Oral Q1200  . predniSONE  20 mg Oral Q breakfast  . Travoprost (BAK Free)  1 drop Both Eyes QHS  . DISCONTD: predniSONE  30 mg Oral Q breakfast   PRN MED's: acetaminophen, ALPRAZolam, levalbuterol, ondansetron (ZOFRAN) IV Home: Home Living Lives With: Spouse Receives Help From: Other (Comment) (housekeeper for cleaning) Type of Home: Mobile home Home Layout: One level Home Access: Stairs to enter Entrance Stairs-Rails: Doctor, general practice of Steps: 5 Bathroom Shower/Tub: Forensic scientist: Standard Home Adaptive Equipment: None  Functional History: Prior Function Level of Independence: Independent with basic ADLs;Independent with transfers;Independent with gait;Needs assistance with homemaking Light Housekeeping: Moderate Driving: Yes Vocation: Retired Functional Status:   Mobility: Bed Mobility Bed Mobility: No (pt only agreeable to bed exercises today) Left Sidelying to Sit: 4: Min assist;HOB elevated (comment degrees);With rails (30 degrees) Supine to Sit: 4: Min assist;HOB elevated (Comment degrees) Supine to Sit Details (indicate cue type and reason): inc time secondary to pt anxiety; max encouragement and cues for relaxation Sitting - Scoot to Edge of Bed: 5: Supervision Sitting - Scoot to Edge of Bed Details (indicate cue type and reason): increased time and cueing for reciprocal scooting Sit to Sidelying Left: 4: Min assist;HOB flat;With rail (fo rlegs) Transfers Transfers: Yes Sit to Stand: 1: +2 Total assist;With upper extremity assist;From bed;From chair/3-in-1;With armrests (80%) Sit to Stand Details (indicate cue type and reason): facilitation for follow through; cues for safe hand placement Stand to Sit: 3: Mod assist;With upper extremity assist;To chair/3-in-1;With armrests Stand to Sit Details: cues for safe technique and to control descent Stand Pivot Transfers: 3: Mod assist;With armrests Stand Pivot Transfer Details (indicate cue type and reason): cues for sequencing and encouragement; pt used  RW for transfer (SPT x2 bed->3in1->recliner; with most cueing for encouragement and sequencing as pt very anxious) Ambulation/Gait Ambulation/Gait: No (pt very anxious, SpO2 in mid 80s on venturi mask) Stairs: No    ADL: ADL Eating/Feeding: Simulated;Set up Where Assessed - Eating/Feeding: Edge of bed Grooming: Simulated;Set up Where Assessed - Grooming: Sitting, bed;Unsupported Upper Body Bathing: Simulated;Set up Where Assessed - Upper Body Bathing: Unsupported;Sitting, bed Lower Body Bathing: Simulated;Moderate assistance Where Assessed - Lower Body Bathing: Sit to stand from bed Upper Body Dressing: Simulated;Set up Where Assessed - Upper Body Dressing: Unsupported;Sitting, bed Lower Body Dressing: Moderate assistance;Performed Where  Assessed - Lower Body Dressing: Sit to stand from bed Toilet Transfer: Performed;Moderate assistance Toilet Transfer Details (indicate cue type and reason): pt very anxious that she would have another "episode like the one last night" (respiratory distress) and therefore required increased time and assist Toilet Transfer Method: Stand pivot Toilet Transfer Equipment: Bedside commode Toileting - Clothing Manipulation: Performed;+1 Total assistance Toileting - Clothing Manipulation Details (indicate cue type and reason): sit to stand from EOB Where Assessed - Glass blower/designer Manipulation: Standing Toileting - Hygiene: Performed;+1 Total assistance Where Assessed - Toileting Hygiene: Sit to stand from 3-in-1 or toilet Tub/Shower Transfer: Not assessed Tub/Shower Transfer Method: Not assessed Ambulation Related to ADLs: Pt only able to take 2 to 3 steps up at the EOB with mod assist.    ADL Comments: Pt limited today by anxiety. RN aware and in room. Pt repeatedly stated, "You people just push me and I can't move like a rabbit!"  Cognition: Cognition Arousal/Alertness: Awake/alert Orientation Level: Oriented X4 Cognition Arousal/Alertness: Awake/alert Overall Cognitive Status: Appears within functional limits for tasks assessed Orientation Level: Oriented X4  Blood pressure 107/42, pulse 67, temperature 98 F (36.7 C), temperature source Oral, resp. rate 23, height 5\' 5"  (1.651 m), weight 71 kg (156 lb 8.4 oz), SpO2 94.00%. Physical Exam  Constitutional: She is oriented to person, place, and time. She appears well-developed. She has a sickly appearance.  HENT:  Head: Normocephalic and atraumatic.  Eyes: Conjunctivae and EOM are normal. Pupils are equal, round, and reactive to light.  Neck: Normal range of motion. Neck supple.  Cardiovascular: Normal rate and regular rhythm.   Pulmonary/Chest: Effort normal and breath sounds normal.  Abdominal: Soft. Bowel sounds are normal.   Musculoskeletal: She exhibits no edema.  Neurological: She is alert and oriented to person, place, and time. She has normal reflexes. No cranial nerve deficit or sensory deficit.       Generalized weakness all fours with 3+ to 4/5 proximal to 4/5 distally. Fine motor coordination is fair to good. Cognitively she has fair insight and awareness. She is a bit impulsive. She occasionally drifted off to sleep during our discussion today.  Psychiatric: Her mood appears anxious. Her speech is delayed. She is agitated. Cognition and memory are impaired. She expresses inappropriate judgment.    Results for orders placed during the hospital encounter of 05/15/11 (from the past 24 hour(s))  GLUCOSE, CAPILLARY     Status: Normal   Collection Time   06/02/11  8:37 AM      Component Value Range   Glucose-Capillary 76  70 - 99 (mg/dL)   Comment 1 Notify RN     Comment 2 Documented in Chart    GLUCOSE, CAPILLARY     Status: Abnormal   Collection Time   06/02/11 12:47 PM      Component Value Range   Glucose-Capillary 103 (*) 70 - 99 (  mg/dL)  GLUCOSE, CAPILLARY     Status: Abnormal   Collection Time   06/02/11  4:54 PM      Component Value Range   Glucose-Capillary 131 (*) 70 - 99 (mg/dL)  GLUCOSE, CAPILLARY     Status: Abnormal   Collection Time   06/02/11  9:37 PM      Component Value Range   Glucose-Capillary 109 (*) 70 - 99 (mg/dL)   No results found.  Assessment/Plan: Diagnosis: Acute on chronic respiratory failure with subsequent deconditioning 1. Does the need for close, 24 hr/day medical supervision in concert with the patient's rehab needs make it unreasonable for this patient to be served in a less intensive setting? Yes 2. Co-Morbidities requiring supervision/potential complications: SVT, CAD, HTN 3. Due to bladder management, bowel management, safety, skin/wound care, disease management, medication administration and pain management, does the patient require 24 hr/day rehab nursing? Yes and  Potentially 4. Does the patient require coordinated care of a physician, rehab nurse, PT (1-2 hrs/day, 5 days/week) and OT (1-2 hrs/day, 5 days/week) to address physical and functional deficits in the context of the above medical diagnosis(es)? Yes Addressing deficits in the following areas: balance, endurance, locomotion, strength, transferring, bowel/bladder control, bathing, dressing, feeding, grooming, toileting and psychosocial support 5. Can the patient actively participate in an intensive therapy program of at least 3 hrs of therapy per day at least 5 days per week? Yes 6. The potential for patient to make measurable gains while on inpatient rehab is excellent 7. Anticipated functional outcomes upon discharge from inpatients are supervision to modified independent PT, supervision to modified independent OT,  8. Estimated rehab length of stay to reach the above functional goals is: 1-2 weeks 9. Does the patient have adequate social supports to accommodate these discharge functional goals? Yes 10. Anticipated D/C setting: Home 11. Anticipated post D/C treatments: HH therapy 12. Overall Rehab/Functional Prognosis: excellent  RECOMMENDATIONS: This patient's condition is appropriate for continued rehabilitative care in the following setting: CIR Patient has agreed to participate in recommended program. Yes Note that insurance prior authorization may be required for reimbursement for recommended care.  Comment: Patient is motivated to improve her husband is quite supportive. Rehabilitation nurse will followup.   Ranelle Oyster M.D. 06/03/2011

## 2011-06-03 NOTE — Progress Notes (Signed)
Report called to [facility] on [address]. Patient is currently eating lunch, will transfer after patient finishes eating her lunch

## 2011-06-03 NOTE — Progress Notes (Signed)
   CARE MANAGEMENT NOTE 06/03/2011  Patient:  PARTHENA, FERGESON   Account Number:  [id number]  Date Initiated:  05/16/2011  Documentation initiated by:  Ronny Flurry  Subjective/Objective Assessment:   DX: Acute-on-chronic respiratory failure in setting of CAP (community acquired pneumonia) and Acute exacerbation of chronic obstructive pulmonary disease (COPD), intubated     Action/Plan:   Anticipated DC Date:  05/27/2011   Anticipated DC Plan:  IP REHAB FACILITY  In-house referral  Clinical Social Worker      DC Planning Services  CM consult      Choice offered to / List presented to:             Status of service:  In process, will continue to follow Medicare Important Message given?   (If response is "NO", the following Medicare IM given date fields will be blank) Date Medicare IM given:   Date Additional Medicare IM given:    Discharge Disposition:    Per UR Regulation:  Reviewed for med. necessity/level of care/duration of stay  If discussed at Long Length of Stay Meetings, dates discussed:    Comments:  06/03/11 Natahsa Marian,RN,BSN 1600 PER [facility]IE WITH REHAB, BLUE MEDICARE HAS APPROVED IP REHAB STAY.  SPOKE WITH DR SIMONDS, HE STATES WE WILL PLAN FOR TENTATIVE DC TO REHAB TOMORROW AS LONG AS PT'S OXYGEN REQUIREMENTS REMAIN STABLE.  WILL FOLLOW UP IN AM. Phone #[phone]

## 2011-06-03 NOTE — Progress Notes (Signed)
Physical Therapy Treatment Patient Details Name: [name] [name] MRN: [id number] DOB: [birth year] Today's Date: 06/03/2011  PT Assessment/Plan  PT - Assessment/Plan Comments on Treatment Session: Pt s/p VDRF who continues to be limited by respiratory status. Pt with sats 92 at rest on 50% venturi and with moving to EOB drop to 88%, during transfer to chair pt drops to 85% and with any leg exercise also to 88% on 50% FiO2. Pt with increased time to recover between all transitional movements and education for oxygen demand and rationale behind momentary O2 drops. Pt does not appear anxious but when questioned states she gets concerned with moving that she won't be able to breathe but appears to respond well to explanations with movement. RN present end of session and aware of transfer and need to assist pt back to bed.  PT Plan: Discharge plan remains appropriate PT Goals  Acute Rehab PT Goals PT Goal: Supine/Side to Sit - Progress: Progressing toward goal PT Goal: Sit to Supine/Side - Progress: Progressing toward goal PT Goal: Sit to Stand - Progress: Progressing toward goal PT Goal: Stand to Sit - Progress: Progressing toward goal PT Transfer Goal: Bed to Chair/Chair to Bed - Progress: Progressing toward goal Pt will Ambulate: 1 - 15 feet;with min assist;with least restrictive assistive device PT Goal: Ambulate - Progress: Revised due to lack of progress PT Goal: Perform Home Exercise Program - Progress: Progressing toward goal  PT Treatment Precautions/Restrictions  Precautions Precautions: Fall Precaution Comments: oxygen Required Braces or Orthoses: No Restrictions Weight Bearing Restrictions: No Mobility (including Balance) Bed Mobility Supine to Sit: 5: Supervision;With rails;HOB elevated (Comment degrees) (HOB 25degrees) Supine to Sit Details (indicate cue type and reason): increased time to complete and cueing for breathing  Sitting - Scoot to Edge of Bed: 6: Modified independent  (Device/Increase time);With rail Transfers Sit to Stand: 4: Min assist;From bed Sit to Stand Details (indicate cue type and reason): cueing for hand placement, increased time EOB before transfer to recover oxygen saturation Stand to Sit: 4: Min assist;To chair/3-in-1;With armrests Stand to Sit Details: minguard with cues to control descent as pt rushes to sit and plops down Stand Pivot Transfers: 4: Min assist Stand Pivot Transfer Details (indicate cue type and reason): with RW from bed to chair with cueing, pt maintains flexed posture with transfer Ambulation/Gait Ambulation/Gait: No Stairs: No  Posture/Postural Control Posture/Postural Control: Postural limitations Postural Limitations: kyphotic posture and accessory muscle use Balance Balance Assessed: No Exercise  General Exercises - Lower Extremity Long Arc Quad: AROM;Both;10 reps;Seated Hip Flexion/Marching: AROM;Both;10 reps;Seated End of Session PT - End of Session Equipment Utilized During Treatment: Gait belt Activity Tolerance: Patient limited by fatigue Patient left: in chair;with call bell in reach;with family/visitor present Nurse Communication: Mobility status for transfers General Behavior During Session: [facility] for tasks performed Cognition: [facility] for tasks performed  Delorse Lek 06/03/2011, 5:51 PM Delaney Meigs, PT [phone]

## 2011-06-03 NOTE — Progress Notes (Signed)
Name: [name] [name] MRN: [id number] DOB: 1941/01/01    LOS: 19  PCCM Progress NOTE  Pt Profile/ History:  70 YOWF (oxygen dependant 2.5 liters), presented via EMS on 3/20 with progressive resp failure and malaise. Failed attempt at NIPPV and therefore intubated.   Lines, Tubes, etc:  ETT 3/20>>>3/22  ETT 3/24>>>3/27  Left IJ CVL 3/20>>> 3/29  Foley cath 4/3>>> 4/6 LUE PICC 4/5 >>   Microbiology:  Sputum 3/20>>>NF  BCx2 3/20>>>neg  UC 3/20>>>neg  MRSA PCR>>>neg   Antibiotics:  Rocephin 3/20>>>3/28  azithro 3/20>>>3/25   Studies/Events:  Echo 3/20>>> EF 65-70% LVH   Consults: cardiology  Subjective :  Tolerating removal of Foley. Remains very weak but in no distress   Physical Examination:  Vital signs reviewed.  GEN: asleep  HEENT:  Dry mucosa  NECK:  Supple  ; no JVD;   RESP  Markedly diminished BS. No wheeze CARD:  RRR, no m/r/g  No edema GI:   Soft & nt; nml bowel sounds; no organomegaly or masses detected. Musco: Warm bil, no deformities or joint swelling noted.  Neuro: alert, no focal deficits noted.   Skin: Warm, no lesions or rashes   Lab Results: Basic Metabolic Panel:  CBC:  CBG:    Imaging:   No new CXR   Assessment and Plan: Acute exacerbation of chronic respiratory failure/COPD: Much improved. - Continue supplemental O2 - titrate carefully to maintain SpO2 88-94%  - Cont nebulized Levalbuterol (due to PSVT)   Urinary retention:. Resolved.  SVT:    - Appreciate cardiology input and assistance with patient  - Continue telemetry  - Continue current medication regimen==> still on Amio 400mg Bid.  Anxiety:- Will change clonazepam to long acting Xanan and Cont prn xanax  Dispo:  Tx to [facility] 4/8. Working on transfer to [facility] or SNF    Billy Fischer, MD;  PCCM service; [place] [phone]

## 2011-06-04 ENCOUNTER — Inpatient Hospital Stay (HOSPITAL_COMMUNITY)
Admission: RE | Admit: 2011-06-04 | Discharge: 2011-06-19 | DRG: 945 | Disposition: A | Discharge status: Home Health | Payer: Medicare Other | Source: Ambulatory Visit | Attending: Physical Medicine & Rehabilitation | Admitting: Physical Medicine & Rehabilitation

## 2011-06-04 ENCOUNTER — Inpatient Hospital Stay (HOSPITAL_COMMUNITY): Payer: Medicare Other

## 2011-06-04 DIAGNOSIS — Z5189 Encounter for other specified aftercare: Secondary | ICD-10-CM

## 2011-06-04 DIAGNOSIS — I714 Abdominal aortic aneurysm, without rupture, unspecified: Secondary | ICD-10-CM

## 2011-06-04 DIAGNOSIS — J449 Chronic obstructive pulmonary disease, unspecified: Secondary | ICD-10-CM

## 2011-06-04 DIAGNOSIS — Z7902 Long term (current) use of antithrombotics/antiplatelets: Secondary | ICD-10-CM

## 2011-06-04 DIAGNOSIS — I471 Supraventricular tachycardia, unspecified: Secondary | ICD-10-CM

## 2011-06-04 DIAGNOSIS — R5381 Other malaise: Secondary | ICD-10-CM

## 2011-06-04 DIAGNOSIS — I2510 Atherosclerotic heart disease of native coronary artery without angina pectoris: Secondary | ICD-10-CM

## 2011-06-04 DIAGNOSIS — E669 Obesity, unspecified: Secondary | ICD-10-CM

## 2011-06-04 DIAGNOSIS — J441 Chronic obstructive pulmonary disease with (acute) exacerbation: Secondary | ICD-10-CM

## 2011-06-04 DIAGNOSIS — J9620 Acute and chronic respiratory failure, unspecified whether with hypoxia or hypercapnia: Secondary | ICD-10-CM

## 2011-06-04 DIAGNOSIS — R0600 Dyspnea, unspecified: Secondary | ICD-10-CM

## 2011-06-04 DIAGNOSIS — Z8551 Personal history of malignant neoplasm of bladder: Secondary | ICD-10-CM

## 2011-06-04 DIAGNOSIS — Z79899 Other long term (current) drug therapy: Secondary | ICD-10-CM

## 2011-06-04 DIAGNOSIS — E785 Hyperlipidemia, unspecified: Secondary | ICD-10-CM

## 2011-06-04 DIAGNOSIS — I10 Essential (primary) hypertension: Secondary | ICD-10-CM | POA: Insufficient documentation

## 2011-06-04 DIAGNOSIS — F419 Anxiety disorder, unspecified: Secondary | ICD-10-CM | POA: Diagnosis present

## 2011-06-04 DIAGNOSIS — H409 Unspecified glaucoma: Secondary | ICD-10-CM

## 2011-06-04 DIAGNOSIS — J189 Pneumonia, unspecified organism: Secondary | ICD-10-CM

## 2011-06-04 DIAGNOSIS — I498 Other specified cardiac arrhythmias: Secondary | ICD-10-CM | POA: Insufficient documentation

## 2011-06-04 DIAGNOSIS — Z9981 Dependence on supplemental oxygen: Secondary | ICD-10-CM

## 2011-06-04 DIAGNOSIS — F341 Dysthymic disorder: Secondary | ICD-10-CM

## 2011-06-04 DIAGNOSIS — R350 Frequency of micturition: Secondary | ICD-10-CM

## 2011-06-04 DIAGNOSIS — Z7982 Long term (current) use of aspirin: Secondary | ICD-10-CM

## 2011-06-04 DIAGNOSIS — G9340 Encephalopathy, unspecified: Secondary | ICD-10-CM

## 2011-06-04 DIAGNOSIS — K5900 Constipation, unspecified: Secondary | ICD-10-CM

## 2011-06-04 DIAGNOSIS — Z87891 Personal history of nicotine dependence: Secondary | ICD-10-CM

## 2011-06-04 DIAGNOSIS — F411 Generalized anxiety disorder: Secondary | ICD-10-CM

## 2011-06-04 DIAGNOSIS — J9600 Acute respiratory failure, unspecified whether with hypoxia or hypercapnia: Secondary | ICD-10-CM

## 2011-06-04 MED ORDER — SENNOSIDES-DOCUSATE SODIUM 8.6-50 MG PO TABS
2.0000 | ORAL_TABLET | Freq: Every day | ORAL | Status: DC
  Administered 2011-06-04 – 2011-06-18 (×10): 2 via ORAL
  Filled 2011-06-04 (×4): qty 2
  Filled 2011-06-04: qty 1
  Filled 2011-06-04 (×7): qty 2

## 2011-06-04 MED ORDER — LEVALBUTEROL HCL 0.63 MG/3ML IN NEBU: 0.6300 mg | INHALATION_SOLUTION | RESPIRATORY_TRACT | Status: DC | PRN

## 2011-06-04 MED ORDER — POLYETHYLENE GLYCOL 3350 17 G PO PACK
17.0000 g | PACK | Freq: Every day | ORAL | Status: DC | PRN
  Administered 2011-06-05: 17 g via ORAL
  Filled 2011-06-04: qty 1

## 2011-06-04 MED ORDER — DIGOXIN 125 MCG PO TABS: 0.1250 mg | ORAL_TABLET | Freq: Every day | ORAL | Status: DC

## 2011-06-04 MED ORDER — DILTIAZEM HCL ER COATED BEADS 300 MG PO CP24: 300.0000 mg | ORAL_CAPSULE | Freq: Every day | ORAL | Status: DC

## 2011-06-04 MED ORDER — GLUCERNA SHAKE PO LIQD: 237.0000 mL | Freq: Three times a day (TID) | ORAL | Status: DC

## 2011-06-04 MED ORDER — GUAIFENESIN-DM 100-10 MG/5ML PO SYRP: 5.0000 mL | ORAL_SOLUTION | Freq: Four times a day (QID) | ORAL | Status: DC | PRN

## 2011-06-04 MED ORDER — DIGOXIN 125 MCG PO TABS
0.1250 mg | ORAL_TABLET | Freq: Every day | ORAL | Status: DC
  Administered 2011-06-05 – 2011-06-19 (×15): 0.125 mg via ORAL
  Filled 2011-06-04 (×16): qty 1

## 2011-06-04 MED ORDER — FLEET ENEMA 7-19 GM/118ML RE ENEM
1.0000 | ENEMA | Freq: Once | RECTAL | Status: AC | PRN
  Filled 2011-06-04: qty 1

## 2011-06-04 MED ORDER — ONDANSETRON HCL 4 MG/2ML IJ SOLN: 4.0000 mg | Freq: Four times a day (QID) | INTRAMUSCULAR | Status: DC | PRN

## 2011-06-04 MED ORDER — ASPIRIN 81 MG PO CHEW
81.0000 mg | CHEWABLE_TABLET | Freq: Every day | ORAL | Status: DC
  Administered 2011-06-05 – 2011-06-19 (×15): 81 mg via ORAL
  Filled 2011-06-04 (×15): qty 1

## 2011-06-04 MED ORDER — ISOSORBIDE MONONITRATE ER 30 MG PO TB24
30.0000 mg | ORAL_TABLET | Freq: Every day | ORAL | Status: DC
  Administered 2011-06-05 – 2011-06-19 (×15): 30 mg via ORAL
  Filled 2011-06-04 (×16): qty 1

## 2011-06-04 MED ORDER — AMIODARONE HCL 400 MG PO TABS: 400.0000 mg | ORAL_TABLET | Freq: Two times a day (BID) | ORAL | Status: DC

## 2011-06-04 MED ORDER — BUDESONIDE 0.5 MG/2ML IN SUSP: 0.5000 mg | Freq: Two times a day (BID) | RESPIRATORY_TRACT | Status: DC

## 2011-06-04 MED ORDER — LEVALBUTEROL HCL 0.63 MG/3ML IN NEBU
0.6300 mg | INHALATION_SOLUTION | Freq: Four times a day (QID) | RESPIRATORY_TRACT | Status: DC
  Administered 2011-06-04 – 2011-06-19 (×55): 0.63 mg via RESPIRATORY_TRACT
  Filled 2011-06-04 (×64): qty 3

## 2011-06-04 MED ORDER — CLOPIDOGREL BISULFATE 75 MG PO TABS: 75.0000 mg | ORAL_TABLET | Freq: Every day | ORAL | Status: DC

## 2011-06-04 MED ORDER — ASPIRIN 81 MG PO CHEW: 81.0000 mg | CHEWABLE_TABLET | Freq: Every day | ORAL | Status: DC

## 2011-06-04 MED ORDER — CLOPIDOGREL BISULFATE 75 MG PO TABS
75.0000 mg | ORAL_TABLET | Freq: Every day | ORAL | Status: DC
  Administered 2011-06-05 – 2011-06-19 (×15): 75 mg
  Filled 2011-06-04 (×16): qty 1

## 2011-06-04 MED ORDER — ALPRAZOLAM 0.25 MG PO TABS: 0.2500 mg | ORAL_TABLET | Freq: Three times a day (TID) | ORAL | Status: AC | PRN

## 2011-06-04 MED ORDER — ACETAMINOPHEN 325 MG PO TABS
325.0000 mg | ORAL_TABLET | ORAL | Status: DC | PRN
  Administered 2011-06-05: 325 mg via ORAL
  Administered 2011-06-07: 650 mg via ORAL
  Administered 2011-06-08 – 2011-06-10 (×2): 325 mg via ORAL
  Administered 2011-06-10: 650 mg via ORAL
  Administered 2011-06-11: 325 mg via ORAL
  Administered 2011-06-11: 650 mg via ORAL
  Administered 2011-06-14 – 2011-06-17 (×8): 325 mg via ORAL
  Administered 2011-06-18 (×2): 650 mg via ORAL
  Filled 2011-06-04 (×3): qty 1
  Filled 2011-06-04: qty 2
  Filled 2011-06-04: qty 1
  Filled 2011-06-04 (×2): qty 2
  Filled 2011-06-04: qty 1
  Filled 2011-06-04 (×2): qty 2
  Filled 2011-06-04: qty 1
  Filled 2011-06-04: qty 2
  Filled 2011-06-04: qty 1
  Filled 2011-06-04: qty 2
  Filled 2011-06-04 (×3): qty 1

## 2011-06-04 MED ORDER — PREDNISONE 5 MG PO TABS
15.0000 mg | ORAL_TABLET | Freq: Every day | ORAL | Status: AC
  Administered 2011-06-05: 15 mg via ORAL
  Filled 2011-06-04: qty 1

## 2011-06-04 MED ORDER — LEVALBUTEROL HCL 0.63 MG/3ML IN NEBU
0.6300 mg | INHALATION_SOLUTION | RESPIRATORY_TRACT | Status: DC | PRN
  Filled 2011-06-04: qty 3

## 2011-06-04 MED ORDER — BUDESONIDE 0.5 MG/2ML IN SUSP
0.5000 mg | Freq: Two times a day (BID) | RESPIRATORY_TRACT | Status: DC
  Administered 2011-06-04 – 2011-06-19 (×28): 0.5 mg via RESPIRATORY_TRACT
  Filled 2011-06-04 (×34): qty 2

## 2011-06-04 MED ORDER — AMIODARONE HCL 200 MG PO TABS
400.0000 mg | ORAL_TABLET | Freq: Two times a day (BID) | ORAL | Status: DC
  Administered 2011-06-04 – 2011-06-19 (×30): 400 mg via ORAL
  Filled 2011-06-04 (×33): qty 2

## 2011-06-04 MED ORDER — LEVALBUTEROL HCL 0.63 MG/3ML IN NEBU: 0.6300 mg | INHALATION_SOLUTION | Freq: Four times a day (QID) | RESPIRATORY_TRACT | Status: DC

## 2011-06-04 MED ORDER — TRAVOPROST (BAK FREE) 0.004 % OP SOLN
1.0000 [drp] | Freq: Every day | OPHTHALMIC | Status: DC
  Administered 2011-06-04 – 2011-06-18 (×15): 1 [drp] via OPHTHALMIC
  Filled 2011-06-04 (×2): qty 2.5

## 2011-06-04 MED ORDER — GLUCERNA SHAKE PO LIQD
237.0000 mL | Freq: Three times a day (TID) | ORAL | Status: DC
  Administered 2011-06-04 – 2011-06-18 (×20): 237 mL via ORAL
  Filled 2011-06-04: qty 237

## 2011-06-04 MED ORDER — OXYGEN-HELIUM 20-80 % IN KIT: 5.0000 L/min | PACK | RESPIRATORY_TRACT | Status: DC

## 2011-06-04 MED ORDER — PANTOPRAZOLE SODIUM 40 MG PO TBEC
40.0000 mg | DELAYED_RELEASE_TABLET | Freq: Every morning | ORAL | Status: DC
  Administered 2011-06-05 – 2011-06-19 (×15): 40 mg via ORAL
  Filled 2011-06-04 (×17): qty 1

## 2011-06-04 MED ORDER — MOXIFLOXACIN HCL 400 MG PO TABS: 400.0000 mg | ORAL_TABLET | Freq: Every day | ORAL | Status: AC

## 2011-06-04 MED ORDER — BISACODYL 10 MG RE SUPP
10.0000 mg | Freq: Every day | RECTAL | Status: DC | PRN
  Administered 2011-06-06: 10 mg via RECTAL
  Filled 2011-06-04 (×2): qty 1

## 2011-06-04 MED ORDER — ALUM & MAG HYDROXIDE-SIMETH 200-200-20 MG/5ML PO SUSP: 30.0000 mL | ORAL | Status: DC | PRN

## 2011-06-04 MED ORDER — DIPHENHYDRAMINE HCL 12.5 MG/5ML PO ELIX: 12.5000 mg | ORAL_SOLUTION | Freq: Four times a day (QID) | ORAL | Status: DC | PRN

## 2011-06-04 MED ORDER — TRAZODONE HCL 50 MG PO TABS
25.0000 mg | ORAL_TABLET | Freq: Every evening | ORAL | Status: DC | PRN
  Administered 2011-06-04: 50 mg via ORAL
  Filled 2011-06-04 (×2): qty 1

## 2011-06-04 MED ORDER — DILTIAZEM HCL ER COATED BEADS 300 MG PO CP24
300.0000 mg | ORAL_CAPSULE | Freq: Every day | ORAL | Status: DC
  Administered 2011-06-05 – 2011-06-19 (×15): 300 mg via ORAL
  Filled 2011-06-04 (×16): qty 1

## 2011-06-04 MED ORDER — ALPRAZOLAM 0.25 MG PO TABS
0.2500 mg | ORAL_TABLET | Freq: Three times a day (TID) | ORAL | Status: DC | PRN
Start: 2011-06-04 — End: 2011-06-11
  Administered 2011-06-04 – 2011-06-10 (×11): 0.25 mg via ORAL
  Filled 2011-06-04 (×13): qty 1

## 2011-06-04 MED ORDER — ISOSORBIDE MONONITRATE ER 30 MG PO TB24: 30.0000 mg | ORAL_TABLET | Freq: Every day | ORAL | Status: DC

## 2011-06-04 NOTE — H&P (Signed)
Physical Medicine and Rehabilitation Admission H&P  [name] [name] is an 71 y.o. female.  Chief Complaint   Patient presents with   .  Respiratory Distress   :  HPI: [name] [name] is an 71 y.o. female with COPD-oxygen dependant 2.5 liters, presented via EMS on 3/20 w/ CC: not feeling well for 2-3 days was ambulatory upon EMS arrival, but patient's condition deteriorated in route. Upon arrival to the ED, she was nonverbal, and leaning forward, with expiratory wheezing. Patient was placed on BiPAP immediately. No reported fevers. No chest pains. Failed attempt at NIPPV and therefore intubated. She had ordered and given Solu-Medrol and multiple breathing treatments in route. Treated with steroids and IV antibiotics for Acute-on-chronic respiratory failure and acute exacerbation of COPD in setting of possible CAP. Extubated on 03/22. Did have issues with agitation due to encephalopathy/ anxiety. Patient developed PSVT and started on digoxin, diltiazem and amiodarone for rate control. Therapies initiated and patient noted to be deconditioned and anxious- requiring encouragement for participation. Rehabilitation was asked to evaluate the patient and felt that she could benefit from an inpatient rehabilitation stay.   Review of Systems  Eyes: Negative for blurred vision and double vision.  Respiratory: Positive for cough (increased today) and shortness of breath (chronic).  Cardiovascular: Negative for chest pain and palpitations.  Gastrointestinal: Positive for heartburn and constipation.  Genitourinary: Positive for frequency.  Musculoskeletal: Positive for back pain and joint pain (chronic b-knee pain).  Psychiatric/Behavioral: The patient is nervous/anxious.   Past Medical History   Diagnosis  Date   .  Hypertension    .  Hyperlipidemia    .  Cerebrovascular disease, unspecified    .  Unspecified glaucoma    .  Obesity, unspecified    .  COPD (chronic obstructive pulmonary disease)    .  Coronary  artery disease      cath 04/06/10: LAD occluded with R-L collats; med Rx WTC....probable V. Tach...med Rx   .  Ventricular tachycardia    .  SVT (supraventricular tachycardia)    .  AAA (abdominal aortic aneurysm)     Past Surgical History   Procedure  Date   .  Cystoscopy    .  Bladder tumor excision      resection of 5 bladder, and fulguration of 1 small bladder tumor   .  Cystoscopy      cold cup bladder biopsy of five tumors, and fulguration of one tumor   .  Lung surgery      Remote right lung   .  Tubal ligation      Bilateral    Family History   Problem  Relation  Age of Onset   .  Heart disease  Mother  11   .  Heart disease  Father     Social History: Married. Independent but needed rest breaks with most activity PTA. She reports that she quit smoking about 2 weeks ago. Her smoking use included Cigarettes. She has a 17.4 pack-year smoking history. She has never used smokeless tobacco. She reports that she does not drink alcohol or use illicit drugs. Husband supportive but open to [facility] or [facility] SNF past discharge.  Allergies: No Known Allergies  Scheduled Meds:  .  amiodarone  400 mg  Oral  BID   .  aspirin  81 mg  Oral  Daily   .  budesonide  0.5 mg  Nebulization  BID   .  clopidogrel  75 mg  Per Tube  Daily   .  digoxin  0.125 mg  Oral  Daily   .  diltiazem  300 mg  Oral  Daily   .  feeding supplement  237 mL  Oral  TID BM   .  isosorbide mononitrate  30 mg  Oral  Daily   .  levalbuterol  0.63 mg  Nebulization  Q6H   .  predniSONE  15 mg  Oral  Q breakfast   .  Travoprost (BAK Free)  1 drop  Both Eyes  QHS    Continuous Infusions:  PRN Meds:.acetaminophen, ALPRAZolam, levalbuterol, ondansetron (ZOFRAN) IV  Medications Prior to Admission   Medication  Sig  Dispense  Refill   .  acetaminophen-codeine (TYLENOL #3) 300-30 MG per tablet  Take 1 tablet by mouth 3 (three) times daily.     Marland Kitchen  aspirin 81 MG EC tablet  Take 81 mg by mouth daily.     .   Aspirin-Acetaminophen 500-325 MG PACK  Take by mouth. (Goody Body Pain) as needed     .  atorvastatin (LIPITOR) 80 MG tablet  Take 40 mg by mouth daily.     .  budesonide-formoterol (SYMBICORT) 80-4.5 MCG/ACT inhaler  Inhale 2 puffs into the lungs 2 (two) times daily.     .  furosemide (LASIX) 20 MG tablet  Take 1 tablet by mouth daily as needed. For swelling     .  ipratropium-albuterol (DUONEB) 0.5-2.5 (3) MG/3ML SOLN  Take 3 mLs by nebulization daily.     .  metoprolol (TOPROL-XL) 50 MG 24 hr tablet  TAKE 1/2 TABLET TWICE DAILY  30 tablet  12   .  nitroGLYCERIN (NITROSTAT) 0.4 MG SL tablet  Place 0.4 mg under the tongue every 5 (five) minutes as needed. Chest pain     .  Oxygen-Helium 20-80 % KIT  Inhale into the lungs. 2.5 Liters, use as needed with exertion     .  PLAVIX 75 MG tablet  Take 1 tablet by mouth Daily.     .  potassium chloride SA (K-DUR,KLOR-CON) 20 MEQ tablet  Take 20 mEq by mouth 2 (two) times daily.     .  travoprost, benzalkonium, (TRAVATAN) 0.004 % ophthalmic solution  Place 1 drop into both eyes at bedtime.     Home:  Home Living  Lives With: Spouse  Receives Help From: Other (Comment) (housekeeper for cleaning)  Type of Home: Mobile home  Home Layout: One level  Home Access: Stairs to enter  Entrance Stairs-Rails: Water quality scientist of Steps: 5  Bathroom Shower/Tub: Electronics engineer: None  Functional History:  Prior Function  Level of Independence: Independent with basic ADLs;Independent with transfers;Independent with gait;Needs assistance with homemaking  Light Housekeeping: Moderate  Driving: Yes  Vocation: Retired  Functional Status:  Mobility:  Bed Mobility  Bed Mobility: No (pt only agreeable to bed exercises today)  Left Sidelying to Sit: 4: Min assist;HOB elevated (comment degrees);With rails (30 degrees)  Supine to Sit: 5: Supervision;With rails;HOB elevated (Comment degrees)  (HOB 25degrees)  Supine to Sit Details (indicate cue type and reason): increased time to complete and cueing for breathing  Sitting - Scoot to Edge of Bed: 6: Modified independent (Device/Increase time);With rail  Sitting - Scoot to Edge of Bed Details (indicate cue type and reason): increased time and cueing for reciprocal scooting  Sit to Sidelying Left: 4: Min assist;HOB flat;With rail (fo rlegs)  Transfers  Transfers: Yes  Sit to Stand: 4: Min assist;From bed  Sit to Stand Details (indicate cue type and reason): cueing for hand placement, increased time EOB before transfer to recover oxygen saturation  Stand to Sit: 4: Min assist;To chair/3-in-1;With armrests  Stand to Sit Details: minguard with cues to control descent as pt rushes to sit and plops down  Stand Pivot Transfers: 4: Min assist  Stand Pivot Transfer Details (indicate cue type and reason): with RW from bed to chair with cueing, pt maintains flexed posture with transfer  Ambulation/Gait  Ambulation/Gait: No  Stairs: No   ADL:  ADL  Eating/Feeding: Simulated;Set up  Where Assessed - Eating/Feeding: Edge of bed  Grooming: Simulated;Set up  Where Assessed - Grooming: Sitting, bed;Unsupported  Upper Body Bathing: Simulated;Set up  Where Assessed - Upper Body Bathing: Unsupported;Sitting, bed  Lower Body Bathing: Simulated;Moderate assistance  Where Assessed - Lower Body Bathing: Sit to stand from bed  Upper Body Dressing: Simulated;Set up  Where Assessed - Upper Body Dressing: Unsupported;Sitting, bed  Lower Body Dressing: Moderate assistance;Performed  Where Assessed - Lower Body Dressing: Sit to stand from bed  Toilet Transfer: Performed;Moderate assistance  Toilet Transfer Details (indicate cue type and reason): pt very anxious that she would have another "episode like the one last night" (respiratory distress) and therefore required increased time and assist  Toilet Transfer Method: Stand pivot  Toilet Transfer  Equipment: Bedside commode  Toileting - Clothing Manipulation: Performed;+1 Total assistance  Toileting - Clothing Manipulation Details (indicate cue type and reason): sit to stand from EOB  Where Assessed - Glass blower/designer Manipulation: Standing  Toileting - Hygiene: Performed;+1 Total assistance  Where Assessed - Toileting Hygiene: Sit to stand from 3-in-1 or toilet  Tub/Shower Transfer: Not assessed  Tub/Shower Transfer Method: Not assessed  Ambulation Related to ADLs: Pt only able to take 2 to 3 steps up at the EOB with mod assist.  ADL Comments: Pt limited today by anxiety. RN aware and in room. Pt repeatedly stated, "You people just push me and I can't move like a rabbit!"  Cognition:  Cognition  Arousal/Alertness: Awake/alert  Orientation Level: Oriented X4  Cognition  Arousal/Alertness: Awake/alert  Overall Cognitive Status: Appears within functional limits for tasks assessed  Orientation Level: Oriented X4  Blood pressure 126/70, pulse 81, temperature 97.8 F (36.6 C), temperature source Oral, resp. rate 16, height 5\' 5"  (1.651 m), weight 71 kg (156 lb 8.4 oz), SpO2 93.00%.  Physical Exam  Nursing note and vitals reviewed.  Constitutional: She is oriented to person, place, and time. She appears well-developed. She has a sickly appearance. Nasal cannula in place.  Eyes: Conjunctivae are normal. Pupils are equal, round, and reactive to light. Extraocular eye movements are intact Neck: Normal range of motion.  Cardiovascular: Normal rate and regular rhythm. No murmurs rubs or gallops Pulmonary/Chest: Effort normal. She has decreased breath sounds in the right lower field and the left lower field.  Expiratory wheezes upper airway. Some SOB with increased conversation.  Abdominal: Soft. Bowel sounds are normal. nontender Musculoskeletal: She exhibits no edema.  Neurological: She is alert and oriented to person, place, and time. Strength grossly 4/5 in the upper extremities. She  is 3+ to 4/5 in lower extremities. No sensory loss is seen. Reflexes are 1+. Cognitively she is alert and appropriate.renal nerve exam is intact Skin: Skin is warm and dry.  Diffuse ecchymosis on lower abdomen and large ecchymotic area left thigh. Right toe tips and bilateral heels Red  appearing.   Results for orders placed during the hospital encounter of 05/15/11 (from the past 48 hour(s))   GLUCOSE, CAPILLARY Status: Abnormal    Collection Time    06/02/11 4:54 PM   Component  Value  Range  Comment    Glucose-Capillary  131 (*)  70 - 99 (mg/dL)    GLUCOSE, CAPILLARY Status: Abnormal    Collection Time    06/02/11 9:37 PM   Component  Value  Range  Comment    Glucose-Capillary  109 (*)  70 - 99 (mg/dL)    GLUCOSE, CAPILLARY Status: Normal    Collection Time    06/03/11 8:17 AM   Component  Value  Range  Comment    Glucose-Capillary  92  70 - 99 (mg/dL)    COMPREHENSIVE METABOLIC PANEL Status: Abnormal    Collection Time    06/03/11 8:25 AM   Component  Value  Range  Comment    Sodium  137  135 - 145 (mEq/L)     Potassium  3.9  3.5 - 5.1 (mEq/L)     Chloride  96  96 - 112 (mEq/L)     CO2  33 (*)  19 - 32 (mEq/L)     Glucose, Bld  83  70 - 99 (mg/dL)     BUN  18  6 - 23 (mg/dL)     Creatinine, Ser  7.82  0.50 - 1.10 (mg/dL)     Calcium  9.1  8.4 - 10.5 (mg/dL)     Total Protein  5.4 (*)  6.0 - 8.3 (g/dL)     Albumin  3.1 (*)  3.5 - 5.2 (g/dL)     AST  20  0 - 37 (U/L)     ALT  39 (*)  0 - 35 (U/L)     Alkaline Phosphatase  53  39 - 117 (U/L)     Total Bilirubin  1.0  0.3 - 1.2 (mg/dL)     GFR calc non Af Amer  >90  >90 (mL/min)     GFR calc Af Amer  >90  >90 (mL/min)    GLUCOSE, CAPILLARY Status: Abnormal    Collection Time    06/03/11 12:33 PM   Component  Value  Range  Comment    Glucose-Capillary  122 (*)  70 - 99 (mg/dL)     Dg Chest Port 1 View  06/04/2011 *RADIOLOGY REPORT* Clinical Data: Respiratory failure. PORTABLE CHEST - 1 VIEW Comparison: 05/28/2011 Findings: Left PICC  line projects over the left chest wall with the tip likely in the left axillary vein, not definitively visualized on the prior study. There is hyperinflation of the lungs compatible with COPD. Cardiomegaly. Worsening aeration in the left lung base with atelectasis or infiltrate and small bilateral pleural effusions. Vascular congestion. IMPRESSION: Cardiomegaly, vascular congestion, COPD. Increasing left basilar atelectasis or infiltrate. Small bilateral effusions. Original Report Authenticated By: Cyndie Chime, M.D.   Post Admission Physician Evaluation:  1. Functional deficits secondary to severe deconditioning related to acute on chronic respiratory failure. 2. Patient is admitted to receive collaborative, interdisciplinary care between the physiatrist, rehab nursing staff, and therapy team. 3. Patient's level of medical complexity and substantial therapy needs in context of that medical necessity cannot be provided at a lesser intensity of care such as a SNF. 4. Patient has experienced substantial functional loss from his/her baseline which was documented above under the "Functional History" and "Functional Status" headings. Judging by the patient's diagnosis, physical  exam, and functional history, the patient has potential for functional progress which will result in measurable gains while on inpatient rehab. These gains will be of substantial and practical use upon discharge in facilitating mobility and self-care at the household level. 5. Physiatrist will provide 24 hour management of medical needs as well as oversight of the therapy plan/treatment and provide guidance as appropriate regarding the interaction of the two. 6. 24 hour rehab nursing will assist with bladder management, bowel management, safety, skin/wound care, disease management, medication administration and patient education and help integrate therapy concepts, techniques,education, etc. 7. PT will assess and treat for: functional  mobility, gait, endurance, adaptive equipment training. Goals are: modified independent to supervision. 8. OT will assess and treat for: upper extremity strength, range of motion, safety, ADLs, endurance. Goals are: modified independent to supervision. 9. SLP will assess and treat for:  10. Case Management and Social Worker will assess and treat for psychological issues and discharge planning. 11. Team conference will be held weekly to assess progress toward goals and to determine barriers to discharge. 12. Patient will receive at least 3 hours of therapy per day at least 5 days per week.  13. ELOS and Prognosis: 10-14 days.  good Medical Problem List and Plan:  1. DVT Prophylaxis/Anticoagulation: Mechanical: Sequential compression devices, below knee Bilateral lower extremities  2. Pain Management: will monitor for back/ knee pain with increased activity.  3. Mood: seems less anxious with decreased agitation today. LCSW to follow up for formal evaluation.  4. COPD: Reports incrased coughing today. CXR with some evidence of fluid overload. Encourage IS every hour when awake. Follow up CXR in 48 hours per recommendations.  5. PSVT: Resolved on amiodarone bid, digoxin and cardizem.  6. Frequency: Check ua/ucs. Required I and O caths in ICU. Will check PVRs to monitor voiding.  7. Constipation: will start bowel program. Schedule senna at bedtime   Ranelle Oyster M.D. June 04 2011

## 2011-06-04 NOTE — PMR Pre-admission (Signed)
PMR Admission Coordinator Pre-Admission Assessment  Patient: [name] [name] is an 71 y.o., female MRN: [id number] DOB: November 24, 1940 Height: 5\' 5"  (165.1 cm) Weight: 71 kg (156 lb 8.4 oz)  Insurance Information HMO: Yes    PPO:       PCP:       IPA:       80/20:       OTHER: Group # [id number] PRIMARY: Blue Medicare  Policy#: [id number] Subscriber: Willadean Carol CM Name: Bonnell Public      Phone#: [phone]     Fax#: [phone] Pre-Cert#:                                     Employer: Retired Benefits:  Phone #: [phone]     Name: Robley Fries. Date: 02/26/11     Deduct: $0   Out of Pocket Max: $3400 (met $238)   Life Max: unlimited CIR: w/auth $170 days 1-6      SNF: $0 1-10d, $50 11-100 days Outpatient: No visit limits     Co-Pay: $35 per visit Home Health: 100% w/auth      Co-Pay: none DME: 80%     Co-Pay: 20% Providers: in Surveyor, quantity Information    Name Relation Home Work Mobile   Eisel,Roger W Spouse [phone]     Rudi Coco [phone]       Current Medical History  Patient Admitting Diagnosis: Deconditioned after resp. Failure  History of Present Illness: Patient is COPD-oxygen dependant 2.5 liters, presented via EMS on 3/20 w/ CC: not feeling well for 2-3 days was ambulatory upon EMS arrival, but patient's condition deteriorated in route. Upon arrival to the ED, she was nonverbal, and leaning forward, with expiratory wheezing. Patient was placed on BiPAP immediately. No reported fevers. No chest pains. Failed attempt at NIPPV and therefore intubated. She had ordered and given Solu-Medrol and multiple breathing treatments in route. Treated with steroids and IV antibiotics for Acute-on-chronic respiratory failure and acute exacerbation of COPD in setting of possible CAP. Extubated on 03/22. Did have issues with agitation due to encephalopathy/ anxiety. Patient developed PSVT and started on digoxin, diltiazem and amiodarone for rate control.   Currently on 4-5 L per [place] oxygen.  Past Medical History  Past Medical History  Diagnosis Date  . Hypertension   . Hyperlipidemia   . Cerebrovascular disease, unspecified   . Unspecified glaucoma   . Obesity, unspecified   . COPD (chronic obstructive pulmonary disease)   . Coronary artery disease     cath 04/06/10: LAD occluded with R-L collats; med Rx WTC....probable V. Tach...med Rx  . Ventricular tachycardia   . SVT (supraventricular tachycardia)   . AAA (abdominal aortic aneurysm)     Family History  family history includes Heart disease in her father and Heart disease (age of onset:85) in her mother.  Prior Rehab/Hospitalizations: No previous rehab admissions.  Current Medications  Current facility-administered medications:acetaminophen (TYLENOL) tablet 650 mg, 650 mg, Oral, Q6H PRN, Lonia Farber, MD, 650 mg at 06/03/11 2307;  ALPRAZolam Prudy Feeler) tablet 0.25 mg, 0.25 mg, Oral, TID PRN, Merwyn Katos, MD;  amiodarone (PACERONE) tablet 400 mg, 400 mg, Oral, BID, Verdene Rio, MD, 400 mg at 06/03/11 2153;  aspirin chewable tablet 81 mg, 81 mg, Oral, Daily, Lewayne Bunting, MD, 81 mg at 06/03/11 1020 budesonide (PULMICORT) nebulizer solution 0.5 mg, 0.5 mg, Nebulization, BID, Belenda Cruise  Gavin Potters, MD, 0.5 mg at 06/03/11 2214;  clopidogrel (PLAVIX) tablet 75 mg, 75 mg, Per Tube, Daily, Oretha Milch, MD, 75 mg at 06/03/11 1020;  digoxin (LANOXIN) tablet 0.125 mg, 0.125 mg, Oral, Daily, Luis Abed, MD, 0.125 mg at 06/03/11 1020;  diltiazem (CARDIZEM CD) 24 hr capsule 300 mg, 300 mg, Oral, Daily, Cassell Clement, MD, 300 mg at 06/03/11 1020 feeding supplement (GLUCERNA SHAKE) liquid 237 mL, 237 mL, Oral, TID BM, Hettie Holstein, RD, 237 mL at 06/02/11 1509;  isosorbide mononitrate (IMDUR) 24 hr tablet 30 mg, 30 mg, Oral, Daily, Roger A Arguello, PA-C, 30 mg at 06/03/11 1020;  levalbuterol (XOPENEX) nebulizer solution 0.63 mg, 0.63 mg, Nebulization, Q6H, Verdene Rio, MD, 0.63 mg at 06/03/11 2214 levalbuterol (XOPENEX) nebulizer solution 0.63 mg, 0.63 mg, Nebulization, Q3H PRN, Verdene Rio, MD;  ondansetron [facility]) injection 4 mg, 4 mg, Intravenous, Q6H PRN, Ilda Basset, MD, 4 mg at 05/19/11 0201;  predniSONE (DELTASONE) tablet 15 mg, 15 mg, Oral, Q breakfast, Merwyn Katos, MD, 15 mg at 06/04/11 5784 Travoprost (BAK Free) (TRAVATAN) 0.004 % ophthalmic solution SOLN 1 drop, 1 drop, Both Eyes, QHS, Merwyn Katos, MD, 1 drop at 06/03/11 2154  Patients Current Diet: Cardiac  Precautions / Restrictions Precautions Precautions: Fall Precaution Comments: oxygen Required Braces or Orthoses: No Restrictions Weight Bearing Restrictions: No   Prior Activity Level Limited Community (1-2x/wk): Had hair fixed weekly and to doctors weekly  Home Assistive Devices / Equipment Home Assistive Devices/Equipment: Oxygen (oxygen at 2.5 liters at night ) Home Adaptive Equipment: None  Prior Functional Level Prior Function Level of Independence: Independent with basic ADLs;Independent with transfers;Independent with gait;Needs assistance with homemaking Light Housekeeping: Moderate Driving: Yes Vocation: Retired  Current Functional Level Cognition  Arousal/Alertness: Awake/alert Overall Cognitive Status: Appears within functional limits for tasks assessed Orientation Level: Oriented X4    Sensation  Light Touch: Appears Intact (In bilateral UEs) Stereognosis: Not tested Hot/Cold: Not tested Proprioception: Not tested    Coordination  Gross Motor Movements are Fluid and Coordinated: Yes Fine Motor Movements are Fluid and Coordinated: Yes    ADLs  Eating/Feeding: Simulated;Set up Where Assessed - Eating/Feeding: Edge of bed Grooming: Simulated;Set up Where Assessed - Grooming: Sitting, bed;Unsupported Upper Body Bathing: Simulated;Set up Where Assessed - Upper Body Bathing: Unsupported;Sitting, bed Lower Body Bathing:  Simulated;Moderate assistance Where Assessed - Lower Body Bathing: Sit to stand from bed Upper Body Dressing: Simulated;Set up Where Assessed - Upper Body Dressing: Unsupported;Sitting, bed Lower Body Dressing: Moderate assistance;Performed Where Assessed - Lower Body Dressing: Sit to stand from bed Toilet Transfer: Performed;Moderate assistance Toilet Transfer Details (indicate cue type and reason): pt very anxious that she would have another "episode like the one last night" (respiratory distress) and therefore required increased time and assist Toilet Transfer Method: Stand pivot Toilet Transfer Equipment: Bedside commode Toileting - Clothing Manipulation: Performed;+1 Total assistance Toileting - Clothing Manipulation Details (indicate cue type and reason): sit to stand from EOB Where Assessed - Glass blower/designer Manipulation: Standing Toileting - Hygiene: Performed;+1 Total assistance Where Assessed - Toileting Hygiene: Sit to stand from 3-in-1 or toilet Tub/Shower Transfer: Not assessed Tub/Shower Transfer Method: Not assessed Ambulation Related to ADLs: Pt only able to take 2 to 3 steps up at the EOB with mod assist.    ADL Comments: Pt limited today by anxiety. RN aware and in room. Pt repeatedly stated, "You people just push me and I can't move like a rabbit!"  Mobility  Bed Mobility: No (pt only agreeable to bed exercises today) Left Sidelying to Sit: 4: Min assist;HOB elevated (comment degrees);With rails (30 degrees) Supine to Sit: 5: Supervision;With rails;HOB elevated (Comment degrees) (HOB 25degrees) Supine to Sit Details (indicate cue type and reason): increased time to complete and cueing for breathing  Sitting - Scoot to Edge of Bed: 6: Modified independent (Device/Increase time);With rail Sitting - Scoot to Edge of Bed Details (indicate cue type and reason): increased time and cueing for reciprocal scooting Sit to Sidelying Left: 4: Min assist;HOB flat;With rail (fo  rlegs)    Transfers  Transfers: Yes Sit to Stand: 4: Min assist;From bed Sit to Stand Details (indicate cue type and reason): cueing for hand placement, increased time EOB before transfer to recover oxygen saturation Stand to Sit: 4: Min assist;To chair/3-in-1;With armrests Stand to Sit Details: minguard with cues to control descent as pt rushes to sit and plops down Stand Pivot Transfers: 4: Min assist Stand Pivot Transfer Details (indicate cue type and reason): with RW from bed to chair with cueing, pt maintains flexed posture with transfer    Ambulation / Gait / Stairs / Wheelchair Mobility  Ambulation/Gait Ambulation/Gait: No Stairs: No    Posture / Balance Posture/Postural Control Posture/Postural Control: Postural limitations Postural Limitations: kyphotic posture and accessory muscle use     Previous Home Environment Living Arrangements: Spouse/significant other Lives With: Spouse Receives Help From: Other (Comment) (housekeeper for cleaning) Type of Home: Mobile home Home Layout: One level Home Access: Stairs to enter Entrance Stairs-Rails: Doctor, general practice of Steps: 5 Bathroom Shower/Tub: Forensic scientist: Standard Home Care Services: No  Discharge Living Setting Plans for Discharge Living Setting: Patient's home;Mobile Home Type of Home at Discharge: Mobile home Discharge Home Layout: One level Discharge Home Access: Stairs to enter Entrance Stairs-Number of Steps: 5 Do you have any problems obtaining your medications?: No  Social/Family/Support Systems Patient Roles: Spouse;Parent Contact Information: Kimeka Badour - spouse (h) [phone] (c) [phone] Anticipated Caregiver: Husband Ability/Limitations of Caregiver: Husband retired and can Geneticist, molecular Availability: 24/7 Discharge Plan Discussed with Primary Caregiver: Yes Is Caregiver In Agreement with Plan?: Yes Does Caregiver/Family have Issues with  Lodging/Transportation while Pt is in Rehab?: No Patient has 2 daughters from previous marriage.  Husband has 2 sons from previous marriage.  Goals/Additional Needs Patient/Family Goal for Rehab: PT/OT S/Mod I goals Expected length of stay: 1-2 weeks Cultural Considerations: Christian Dietary Needs: Heart healthy, thin liquids Equipment Needs: TBD Pt/Family Agrees to Admission and willing to participate: Yes Program Orientation Provided & Reviewed with Pt/Caregiver Including Roles  & Responsibilities: Yes  Patient Condition: This patient's condition remains as documented in the Consult dated 06/03/11, in which the Rehabilitation Physician determined and documented that the patient's condition is appropriate for intensive rehabilitative care in an inpatient rehabilitation facility.  Preadmission Screen Completed By:  Trish Mage, 06/04/2011 9:56 AM ______________________________________________________________________   Discussed status with Dr. Riley Kill on 06/04/11 at 1007 and received telephone approval for admission today.  Admission Coordinator:  Trish Mage, time1333/Date04/09/13

## 2011-06-04 NOTE — Discharge Summary (Signed)
Physician Discharge Summary     Patient ID: [name] [name] MRN: [id number] DOB/AGE: January 15, 1941 71 y.o.  Admit date: 05/15/2011 Discharge date: 06/04/2011  Admission Diagnoses: Acute on Chronic respiratory failure in the setting of acute exacerbation of COPD.  Discharge Diagnoses:  Active Problems:  Acute-on-chronic respiratory failure  Acute exacerbation of chronic obstructive pulmonary disease (COPD)  SVT (supraventricular tachycardia)  Anxiety disorder   Significant Hospital tests/ studies/ interventions and procedures  Lines, Tubes, etc:  ETT 3/20>>>3/22  ETT 3/24>>>3/27  Left IJ CVL 3/20>>> 3/29  Foley cath 4/3>>> 4/6  LUE PICC 4/5 >>   Microbiology:  Sputum 3/20>>>NF  BCx2 3/20>>>neg  UC 3/20>>>neg  MRSA PCR>>>neg   Antibiotics:  Rocephin 3/20>>>3/28  azithro 3/20>>>3/25  avelox (5 day course for purulent sputum) 4/9>>>  Studies/Events:  Echo 3/20>>> EF 65-70% LVH  Consults: cardiology  Hospital Course:  Acute exacerbation of chronic respiratory failure in the setting COPD exacerbation:  Presented via EMS on 3/20 s/p 2-3 days of not feeling well. On time of arrival she was unresponsive with prolonged expiratory wheeze. She was briefly placed on NIPPV. She failed to clinically improve and therefore was intubated and placed on mechanical ventilator. Therapeutic interventions included: systemic steroids, empiric antibiotics (see above), scheduled Bronchodilators and sedation while on vent. She continued to improve to point where she was able to be extubated on 3/22. Therapy was titrated (steroids adjusted to PO, O2 titrated, bronchodilators adjusted. On time of transfer she  much improved, however still has significant oxygen requirements and left sided atelectasis/mucous plugging and volume loss. Think she will benefit for aggressive pulmonary hygiene, will add back antibiotics, but think rehab and conditioning will be of the highest benefit to her.  Recommendation -  Continue supplemental O2 - titrate carefully to maintain SpO2 88-94% (for now 5 liters rest and up to 6 for activity) - Cont nebulized Levalbuterol (due to PSVT) and also continue budesonide. Eventually can go back to symbicort.  -aggressive pulmonary hygiene: add flutter valve and IS -will slowly wean prednisone -add 5 day course of avelox for purulent sputum  And mucous plugging, don't think she has pneumonia but she have very little reserve for error.  -we will continue to follow her in rehab for pulmonary subspecialty in-put.   -she will need a follow up 2 view of her chest on 4/11.    SVT:   This occurred during her critical illness. For this we consulted [facility]. She was placed on Amiodarone and supported for her underlying critical illness. This has now resolved and she is in NSR. At time of transfer she is still on Amiodarone as well as diltiazem, and digoxin. The last rounding note by Dr Eden Emms indicated that Amiodarone could be discontinued prior to d/c if she continues to stay in NSR.  Urinary retention: resolved without intervention.   Anxiety:-long history of.  Plan: -cont xanax Discharge Exam: BP 126/70  Pulse 81  Temp(Src) 97.8 F (36.6 C) (Oral)  Resp 16  Ht 5\' 5"  (1.651 m)  Wt 71 kg (156 lb 8.4 oz)  BMI 26.05 kg/m2  SpO2 93%  General - Chronically ill appearing, NAD.  Lungs - markedly decreased on left. No wheeze.  Cardiac - RRR, no gallop.  Extremities - No pitting.  abd- non-tender GU: voids Neuro: intact but has generalized weakness.    Labs at discharge Lab Results  Component Value Date   CREATININE 0.53 06/03/2011   BUN 18 06/03/2011   NA 137 06/03/2011  K 3.9 06/03/2011   CL 96 06/03/2011   CO2 33* 06/03/2011   Lab Results  Component Value Date   WBC 12.5* 06/02/2011   HGB 12.3 06/02/2011   HCT 37.3 06/02/2011   MCV 97.1 06/02/2011   PLT 163 06/02/2011   Lab Results  Component Value Date   ALT 39* 06/03/2011   AST 20 06/03/2011   ALKPHOS 53 06/03/2011    BILITOT 1.0 06/03/2011   Lab Results  Component Value Date   INR 1.05 05/15/2011    Current radiology studies Dg Chest Port 1 View  06/04/2011  *RADIOLOGY REPORT*  Clinical Data: Respiratory failure.  PORTABLE CHEST - 1 VIEW  Comparison: 05/28/2011  Findings: Left PICC line projects over the left chest wall with the tip likely in the left axillary vein, not definitively visualized on the prior study. There is hyperinflation of the lungs compatible with COPD.  Cardiomegaly.  Worsening aeration in the left lung base with atelectasis or infiltrate and small bilateral pleural effusions.  Vascular congestion.  IMPRESSION: Cardiomegaly, vascular congestion, COPD.  Increasing left basilar atelectasis or infiltrate.  Small bilateral effusions.  Original Report Authenticated By: Cyndie Chime, M.D.    Disposition: In-patient rehab.   Discharge Orders    Future Appointments: Provider: Department: Dept Phone: Center:   09/30/2011 11:00 AM [facility] [phone] [facility]   04/06/2012 11:00 AM [facility] [phone] [facility]   04/06/2012 12:00 PM Nada Libman, MD [facility] [phone] [facility]     Medication List  As of 06/04/2011  2:31 PM   ASK your doctor about these medications         acetaminophen-codeine 300-30 MG per tablet   Commonly known as: TYLENOL #3   Take 1 tablet by mouth 3 (three) times daily.      aspirin 81 MG EC tablet   Take 81 mg by mouth daily.      Aspirin-Acetaminophen 500-325 MG Pack   Take by mouth. Marlin Canary Body Pain) as needed      atorvastatin 80 MG tablet   Commonly known as: LIPITOR   Take 40 mg by mouth daily.      furosemide 20 MG tablet   Commonly known as: LASIX   Take 1 tablet by mouth daily as needed. For swelling      ipratropium-albuterol 0.5-2.5 (3) MG/3ML Soln   Commonly known as: DUONEB   Take 3 mLs by nebulization daily.      metoprolol succinate 50 MG 24 hr tablet   Commonly known as: TOPROL-XL   TAKE 1/2 TABLET  TWICE DAILY      nitroGLYCERIN 0.4 MG SL tablet   Commonly known as: NITROSTAT   Place 0.4 mg under the tongue every 5 (five) minutes as needed. Chest pain      Oxygen-Helium 20-80 % Kit   Inhale into the lungs. 2.5 Liters, use as needed with exertion      PLAVIX 75 MG tablet   Generic drug: clopidogrel   Take 1 tablet by mouth Daily.      potassium chloride SA 20 MEQ tablet   Commonly known as: K-DUR,KLOR-CON   Take 20 mEq by mouth 2 (two) times daily.      SYMBICORT 80-4.5 MCG/ACT inhaler   Generic drug: budesonide-formoterol   Inhale 2 puffs into the lungs 2 (two) times daily.      travoprost (benzalkonium) 0.004 % ophthalmic solution   Commonly known as: TRAVATAN   Place 1 drop into both eyes  at bedtime.           Follow-up Information    Follow up with Josue Hector, MD .         Discharged Condition: fair/ weak, still needs significant oxygen support. PCCM will continue to follow during her rehab stay.  Needs 2 view of chest on 4/11 to folow up atelectasis   Physician Statement:   The Patient was personally examined, the discharge assessment and plan has been personally reviewed and I agree with ACNP Babcock's assessment and plan. > 30 minutes of time have been dedicated to discharge assessment, planning and discharge instructions.   SignedAnders Simmonds 06/04/2011, 2:31 PM   Billy Fischer, MD;  PCCM service; [place] [phone]

## 2011-06-04 NOTE — Consult Note (Signed)
Reviewed by Kaydyn Chism EdD 

## 2011-06-04 NOTE — Progress Notes (Signed)
Rehab admissions - I spoke with patient and her husband this morning.  They are agreeable to inpatient rehab prior to home.  Patient very happy with her care on 3700 and would like to stay on 3700 until discharge.  I gave patient and husband booklets about inpatient rehab.  If MD feels patient is medically stable, I have a bed on rehab and can admit to inpatient rehab today.  Pager [phone]

## 2011-06-05 ENCOUNTER — Encounter (HOSPITAL_COMMUNITY): Payer: Self-pay | Admitting: Rehabilitation

## 2011-06-05 DIAGNOSIS — J449 Chronic obstructive pulmonary disease, unspecified: Secondary | ICD-10-CM

## 2011-06-05 DIAGNOSIS — J9620 Acute and chronic respiratory failure, unspecified whether with hypoxia or hypercapnia: Secondary | ICD-10-CM

## 2011-06-05 DIAGNOSIS — F341 Dysthymic disorder: Secondary | ICD-10-CM

## 2011-06-05 DIAGNOSIS — Z5189 Encounter for other specified aftercare: Secondary | ICD-10-CM

## 2011-06-05 DIAGNOSIS — J9600 Acute respiratory failure, unspecified whether with hypoxia or hypercapnia: Secondary | ICD-10-CM

## 2011-06-05 DIAGNOSIS — I498 Other specified cardiac arrhythmias: Secondary | ICD-10-CM

## 2011-06-05 DIAGNOSIS — J441 Chronic obstructive pulmonary disease with (acute) exacerbation: Secondary | ICD-10-CM

## 2011-06-05 DIAGNOSIS — R5381 Other malaise: Secondary | ICD-10-CM

## 2011-06-05 LAB — URINALYSIS, ROUTINE W REFLEX MICROSCOPIC
Hgb urine dipstick: NEGATIVE
Ketones, ur: 15 mg/dL — AB
Protein, ur: NEGATIVE mg/dL
Urobilinogen, UA: 1 mg/dL (ref 0.0–1.0)

## 2011-06-05 LAB — COMPREHENSIVE METABOLIC PANEL
ALT: 29 U/L (ref 0–35)
AST: 16 U/L (ref 0–37)
Albumin: 3 g/dL — ABNORMAL LOW (ref 3.5–5.2)
Chloride: 98 mEq/L (ref 96–112)
Creatinine, Ser: 0.51 mg/dL (ref 0.50–1.10)
Sodium: 139 mEq/L (ref 135–145)
Total Bilirubin: 0.8 mg/dL (ref 0.3–1.2)

## 2011-06-05 LAB — DIFFERENTIAL
Basophils Relative: 0 % (ref 0–1)
Eosinophils Absolute: 0.1 10*3/uL (ref 0.0–0.7)
Lymphocytes Relative: 8 % — ABNORMAL LOW (ref 12–46)
Lymphs Abs: 1 10*3/uL (ref 0.7–4.0)
Monocytes Relative: 7 % (ref 3–12)
Neutro Abs: 11 10*3/uL — ABNORMAL HIGH (ref 1.7–7.7)

## 2011-06-05 LAB — CBC
MCV: 96.9 fL (ref 78.0–100.0)
Platelets: 133 10*3/uL — ABNORMAL LOW (ref 150–400)
RBC: 3.56 MIL/uL — ABNORMAL LOW (ref 3.87–5.11)
RDW: 13.7 % (ref 11.5–15.5)
WBC: 13 10*3/uL — ABNORMAL HIGH (ref 4.0–10.5)

## 2011-06-05 MED ORDER — POTASSIUM CHLORIDE CRYS ER 10 MEQ PO TBCR
10.0000 meq | EXTENDED_RELEASE_TABLET | Freq: Two times a day (BID) | ORAL | Status: AC
  Administered 2011-06-05 – 2011-06-06 (×4): 10 meq via ORAL
  Filled 2011-06-05 (×4): qty 1

## 2011-06-05 MED ORDER — GUAIFENESIN ER 600 MG PO TB12
1200.0000 mg | ORAL_TABLET | Freq: Two times a day (BID) | ORAL | Status: DC
  Administered 2011-06-05 – 2011-06-19 (×29): 1200 mg via ORAL
  Filled 2011-06-05 (×31): qty 2

## 2011-06-05 MED ORDER — SODIUM CHLORIDE 0.9 % IJ SOLN
10.0000 mL | INTRAMUSCULAR | Status: DC | PRN
  Administered 2011-06-05 – 2011-06-18 (×18): 10 mL

## 2011-06-05 NOTE — Progress Notes (Signed)
    Patient ID: [name] [name] MRN: [id number] DOB/AGE: 1940-06-27 71 y.o.   Lines, Tubes, etc:  ETT 3/20>>>3/22  ETT 3/24>>>3/27  Left IJ CVL 3/20>>> 3/29  Foley cath 4/3>>> 4/6  LUE PICC 4/5 >>   Microbiology:  Sputum 3/20>>>NF  BCx2 3/20>>>neg  UC 3/20>>>neg  MRSA PCR>>>neg   Antibiotics:  Rocephin 3/20>>>3/28  azithro 3/20>>>3/25  avelox (5 day course for purulent sputum) 4/9>>>  Studies/Events:  Echo 3/20>>> EF 65-70% LVH  Consults: cardiology  Subjective Still very SOB with exertion. She desaturated to 81-82% when ambulating w/ PT from bed taking 3 steps. Still reports sputum production.  Exam: BP 135/70  Pulse 80  Temp(Src) 98.4 F (36.9 C) (Oral)  Resp 18  Ht 5\' 5"  (1.651 m)  Wt 72.3 kg (159 lb 6.3 oz)  BMI 26.52 kg/m2  SpO2 90%  General - Chronically ill appearing, NAD.  Lungs - markedly decreased on left. No wheeze.  Cardiac - RRR, no gallop.  Extremities - No pitting.  abd- non-tender GU: voids Neuro: intact but has generalized weakness.    Acute exacerbation of chronic respiratory failure in the setting COPD exacerbation:  Still has significant oxygen requirements and left sided atelectasis/mucous plugging and volume loss.  Recommendation - Continue supplemental O2 - titrate carefully to maintain SpO2 88-94% (for now 5 liters rest and up to 6 for activity) - Cont nebulized Levalbuterol (due to PSVT) and also continue budesonide (will add EZPAP) Eventually can go back to symbicort.  -aggressive pulmonary hygiene: add flutter valve (has not received yet) and IS -will slowly wean prednisone -added 5 day course of avelox on 4/9 for purulent sputum  And mucous plugging, don't think she has pneumonia but she have very little reserve for error.  -add mucinex.  - follow up 2 view of her chest on 4/11.   SVT:   This occurred during her critical illness. For this we consulted [facility]. She was placed on Amiodarone and supported for her underlying  critical illness. This has now resolved and she is in NSR. At time of transfer she is still on Amiodarone as well as diltiazem, and digoxin. The last rounding note by Dr Eden Emms indicated that Amiodarone could be discontinued prior to d/c if she continues to stay in NSR.   Attending Addendum:  I have seen the patient, discussed the issues, test results and plans with Kreg Shropshire, NP. I agree with the Assessment and Plans as ammended above.   Levy Pupa, MD, PhD 06/05/2011, 10:46 AM [facility] [phone] or if no answer [phone]

## 2011-06-05 NOTE — Plan of Care (Signed)
Overall Plan of Care [facility]) Patient Details Name: [name] [name] MRN: [id number] DOB: [birth year]  Diagnosis:  Acute on chronic respiratory failure with deconditioning  Primary Diagnosis:   Decondition Physical deconditioning Co-morbidities: COPD, anxiety, CHF  Functional Problem List  Patient demonstrates impairments in the following areas: Balance and Motor  Basic ADL's: grooming, bathing, dressing and toileting Advanced ADL's: simple meal preparation  Transfers:  bed mobility, bed to chair, toilet, tub/shower, car and furniture Locomotion:  ambulation (anticipated)  Additional Impairments:  Impaired Cardiopulmonary status  Anticipated Outcomes Item Anticipated Outcome  Eating/Swallowing  Medication whole/water  Basic self-care  Supervision  Tolieting  Supervision  Bowel/Bladder  Pt contient of bowel/bladder  Transfers  Supervision  Locomotion  Supervision  Communication    Cognition    Pain  Denies pain  Safety/Judgment    Other     Therapy Plan:   OT Frequency: 1-2 X/day, 60-90 minutes PT Frequency: 1-2 x/day, 60-90 minutes     Team Interventions: Item RN PT OT SLP SW TR Other  Self Care/Advanced ADL Retraining   X      Neuromuscular Re-Education         Therapeutic Activities  x X      UE/LE Strength Training/ROM  x X      UE/LE Coordination Activities   X      Visual/Perceptual Remediation/Compensation         DME/Adaptive Equipment Instruction  x X      Therapeutic Exercise  x X      Balance/Vestibular Training  x X      Patient/Family Education  x X      Cognitive Remediation/Compensation X        Functional Mobility Training  x X      Ambulation/Gait Training  x X      Stair Training  x       Wheelchair Propulsion/Positioning  x X      Health and safety inspector Reintegration  x X      Dysphagia/Aspiration Film/video editor         Bladder Management X        Bowel Management  X        Disease Management/Prevention  x       Pain Management X x X      Medication Management         Skin Care/Wound Management   X      Splinting/Orthotics         Discharge Planning   X  x    Psychosocial Support   X  x                       Team Discharge Planning: Destination:  Home Projected Follow-up:  OT: TBD, HHPT  Projected Equipment Needs:  Bedside Commode and Tub Bench, RW, Wheelchair (pending progress) Patient/family involved in discharge planning:  Yes  MD ELOS: 2 weeks Medical Rehab Prognosis:  Excellent Assessment: Patient will work with physical and occupational therapy to address functional mobility endurance and stamina. Anxiety is a factor for this lady. Her ongoing cardiopulmonary complications also are playing a role in limiting her stamina. Goals are for supervision. Husband is supportive and provide help at home.

## 2011-06-05 NOTE — Evaluation (Signed)
Occupational Therapy Assessment and Plan & Session Note  Patient Details  Name: [name] [name] MRN: [id number] Date of Birth: 03/24/1940  OT Diagnosis: acute pain and muscle weakness (generalized) Rehab Potential: Rehab Potential: Good ELOS: ~2weeks   Today's Date: 06/05/2011 Time: 1100-1153 Time Calculation (min): 53 min  Problem List:  Patient Active Problem List  Diagnoses  . HYPERLIPIDEMIA  . TOBACCO ABUSE  . GLAUCOMA  . HYPERTENSION  . SUPRAVENTRICULAR TACHYCARDIA  . CEREBROVASCULAR DISEASE  . ABDOMINAL AORTIC ANEURYSM  . COPD  . CAD  . VENTRICULAR TACHYCARDIA  . Acute-on-chronic respiratory failure  . Acute exacerbation of chronic obstructive pulmonary disease (COPD)  . SVT (supraventricular tachycardia)  . Anxiety disorder  . Physical deconditioning    Past Medical History:  Past Medical History  Diagnosis Date  . Hypertension   . Hyperlipidemia   . Cerebrovascular disease, unspecified   . Unspecified glaucoma   . Obesity, unspecified   . COPD (chronic obstructive pulmonary disease)   . Coronary artery disease     cath 04/06/10: LAD occluded with R-L collats; med Rx WTC....probable V. Tach...med Rx  . Ventricular tachycardia   . SVT (supraventricular tachycardia)   . AAA (abdominal aortic aneurysm)    Past Surgical History:  Past Surgical History  Procedure Date  . Cystoscopy   . Bladder tumor excision     resection of 5 bladder, and fulguration of 1 small bladder tumor  . Cystoscopy     cold cup bladder biopsy of five tumors, and fulguration of one tumor  . Lung surgery     Remote right lung  . Tubal ligation     Bilateral    Assessment & Plan Clinical Impression: [name] [name] is an 71 y.o. female with COPD-oxygen dependant 2.5 liters, presented via EMS on 3/20 w/ CC: not feeling well for 2-3 days was ambulatory upon EMS arrival, but patient's condition deteriorated in route. Upon arrival to the ED, she was nonverbal, and leaning forward, with  expiratory wheezing. Patient was placed on BiPAP immediately. No reported fevers. No chest pains. Failed attempt at NIPPV and therefore intubated. She had ordered and given Solu-Medrol and multiple breathing treatments in route. Treated with steroids and IV antibiotics for Acute-on-chronic respiratory failure and acute exacerbation of COPD in setting of possible CAP. Extubated on 03/22. Did have issues with agitation due to encephalopathy/ anxiety. Patient developed PSVT and started on digoxin, diltiazem and amiodarone for rate control. Therapies initiated and patient noted to be deconditioned and anxious- requiring encouragement for participation. Patient transferred to CIR on 06/04/2011 .    Patient currently fluctuates between min-max assist with basic self-care skills and IADL secondary to muscle weakness, decreased cardiorespiratoy endurance and decreased oxygen support and decreased sitting balance, decreased standing balance, decreased postural control and decreased balance strategies.  Prior to hospitalization, patient could complete desired ADLs and IADLs independently. Patient and family had house keeper in when needed for cleaning responsibilities.   Patient will benefit from skilled intervention to decrease level of assist with basic self-care skills, increase independence with basic self-care skills and increase level of independence with iADL prior to discharge home with care partner.  Anticipate patient will require 24 hour supervision and additional occupational therapy is to be determined.  OT - End of Session Activity Tolerance: Tolerates < 10 min activity with changes in vital signs Endurance Deficit: Yes Endurance Deficit Description: decreased oxygen support on oxygen OT Assessment Rehab Potential: Good Barriers to Discharge: None (none known at this  time) OT Plan OT Frequency: 1-2 X/day, 60-90 minutes Estimated Length of Stay: ~2weeks OT Treatment/Interventions: Balance/vestibular  training;Community reintegration;Discharge planning;Disease mangement/prevention;DME/adaptive equipment instruction;Functional mobility training;Pain management;Patient/family education;Psychosocial support;Self Care/advanced ADL retraining;Skin care/wound managment;Therapeutic Activities;Therapeutic Exercise;UE/LE Coordination activities;UE/LE Strength taining/ROM;Wheelchair propulsion/positioning OT Recommendation Follow Up Recommendations:  (OT: TBD) Equipment Recommended: 3 in 1 bedside comode;Tub/shower seat  Precautions/Restrictions  Precautions Precautions: Fall Required Braces or Orthoses: No Restrictions Weight Bearing Restrictions: No  General Chart Reviewed: Yes  Vital Signs Therapy Vitals Pulse Rate: 80  BP: 135/70 mmHg Oxygen Therapy SpO2: 93 % O2 Device: None (Room air) O2 Flow Rate (L/min): 5 L/min  Pain Pain Assessment Pain Assessment: No/denies pain Pain Score: 0-No pain Faces Pain Scale: No hurt  Home Living/Prior Functioning Home Living Lives With: Spouse Receives Help From: Other (Comment) (housekeeper for cleaning) Type of Home: Mobile home Home Layout: One level Home Access: Stairs to enter Entrance Stairs-Rails: Doctor, general practice of Steps: 5 Bathroom Shower/Tub: Forensic scientist: Standard Home Adaptive Equipment: None IADL History Homemaking Responsibilities: Yes Meal Prep Responsibility: Primary Laundry Responsibility: Primary Shopping Responsibility: Secondary Prior Function Level of Independence: Independent with basic ADLs;Independent with transfers;Independent with gait;Needs assistance with homemaking Light Housekeeping: Moderate Driving: Yes Vocation: Retired Comments: Solicitor, likes cross word puzzles, cards, loves to cook  ADL - See FIM   Vision/Perception  Vision - History Baseline Vision: No visual deficits Visual History: Glaucoma (patients report) Patient Visual Report: No change  from baseline Perception Perception: Within Functional Limits Praxis Praxis: Intact   Cognition Overall Cognitive Status: Appears within functional limits for tasks assessed Arousal/Alertness: Awake/alert Orientation Level: Oriented X4  Sensation Sensation Light Touch: Appears Intact (In bilateral LEs) Stereognosis: Not tested Hot/Cold: Not tested Proprioception: Not tested Coordination Gross Motor Movements are Fluid and Coordinated: Yes Fine Motor Movements are Fluid and Coordinated: Yes  Mobility  Bed Mobility Bed Mobility: Yes Left Sidelying to Sit: 4: Min assist Left Sidelying to Sit Details: Tactile cues for sequencing;Verbal cues for technique;Verbal cues for sequencing Left Sidelying to Sit Details (indicate cue type and reason): Verbal cues for controlled breathing.  Sitting - Scoot to Edge of Bed: 6: Modified independent (Device/Increase time);With rail Sit to Sidelying Left: 5: Supervision Sit to Sidelying Left Details: Verbal cues for sequencing Sit to Sidelying Left Details (indicate cue type and reason): Verbal cues for controlled breathing Transfers Transfers: Yes Sit to Stand: 4: Min assist;From bed;3: Mod assist;From chair/3-in-1 Sit to Stand Details: Verbal cues for sequencing;Verbal cues for safe use of DME/AE Sit to Stand Details (indicate cue type and reason): cueing for hand placement, increased time EOB before transfer to recover oxygen saturation. Initial stand required min assist, next 2 required moderate assist. Stand to Sit: To chair/3-in-1;With armrests;3: Mod assist;To bed Stand to Sit Details (indicate cue type and reason): Verbal cues for precautions/safety;Verbal cues for sequencing Stand to Sit Details: Pt has tendency to plop with uncontrolled descent. Pt not always checking to make sure chair in place.    Extremity/Trunk Assessment RUE Assessment RUE Assessment: Within Functional Limits (weak throughout) LUE Assessment LUE Assessment:  Within Functional Limits (weak throughout)  See FIM for current functional status  Refer to Care Plan for Long Term Goals  Recommendations for other services: None  Discharge Criteria: Patient will be discharged from OT if patient refuses treatment 3 consecutive times without medical reason, if treatment goals not met, if there is a change in medical status, if patient makes no progress towards goals or if patient is discharged from  hospital.  The above assessment, treatment plan, treatment alternatives and goals were discussed and mutually agreed upon: by patient  Session Note 1:1 OT evaluation completed. Focused skilled intervention on bed mobility, energy conservation (pursed lip breathing & breaks prn), UB/LB bathing & dressing, and overall activity tolerance/endurance. Patient unable to tolerate much activity without sats dropping below 90%. Patient took multiple rest breaks and therapist continued to encourage pursed lip breathing. During entire session patient stated "they told me I could take it easy today" and therapist had to continuously encourage patient to work and engage in therapy. Husband present for some of session and seemed supportive.   Micki Cassel 06/05/2011, 12:08 PM

## 2011-06-05 NOTE — Progress Notes (Signed)
[facility] Individual Statement of Services  Patient Name:  [name] [name]  Date:  06/05/2011  Welcome to the [facility].  Our goal is to provide you with an individualized program based on your diagnosis and situation, designed to meet your specific needs.  With this comprehensive rehabilitation program, you will be expected to participate in at least 3 hours of rehabilitation therapies Monday-Friday, with modified therapy programming on the weekends.  Your rehabilitation program will include the following services:  Physical Therapy (PT), Occupational Therapy (OT), 24 hour per day rehabilitation nursing, Therapeutic Recreaction (TR), Case Management (RN and Child psychotherapist), Rehabilitation Medicine, Nutrition Services and Pharmacy Services  Weekly team conferences will be held on  Tuesday  to discuss your progress.  Your RN Case Designer, television/film set will talk with you frequently to get your input and to update you on team discussions.  Team conferences with you and your family in attendance may also be held.  Expected length of stay: about 2 weeks  Overall anticipated outcome: Supervision  Depending on your progress and recovery, your program may change.  Your RN Case Estate agent will coordinate services and will keep you informed of any changes.  Your RN Sports coach and SW names and contact numbers are listed  below.  The following services may also be recommended but are not provided by the [facility]:   Driving Evaluations  Home Health Rehabiltiation Services  Outpatient Rehabilitatation [facility]  Vocational Rehabilitation   Arrangements will be made to provide these services after discharge if needed.  Arrangements include referral to agencies that provide these services.  Your insurance has been verified to be:  [employer] Your primary doctor is:  Dr. Lysbeth Galas  Pertinent information will be shared with  your doctor and your insurance company.  Case Manager: Melanee Spry, [facility] [phone]  Social Worker:  [place], [place] [phone]  Information discussed with and copy given to patient by: Brock Ra, 06/05/2011, 2:09 PM

## 2011-06-05 NOTE — Plan of Care (Signed)
Problem: Consults Goal: [facility] GENERAL PATIENT EDUCATION See Patient Education module for education specifics.  Outcome: Not Progressing Pt will have decrease anxiety before d/c.

## 2011-06-05 NOTE — Progress Notes (Signed)
Occupational Therapy Session Note  Patient Details  Name: [name] [name] MRN: [id number] Date of Birth: 08-Jun-1940  Today's Date: 06/05/2011 Time: 4098-1191 Time Calculation (min): 43 min   Skilled Therapeutic Interventions/Progress Updates:   Pt with very limited endurance during session.  Required mod coaxing to participate.  Worked on sit to stand times 3 for 15 to 30 second intervals.  We utilized the RW for support to stand statically.  Pt's O2 sats decreased to 84 % on 5Ls of nasal cannula oxygen.  Dyspnea 4/4 after brief standing intervals.  Pt needed mod instructional cueing for technique and hand placement for sit to stand.  Therapy Documentation Precautions:  Precautions Precautions: Fall Required Braces or Orthoses: No Restrictions Weight Bearing Restrictions: No  Vital Signs: Therapy Vitals Temp: 97.8 F (36.6 C) Temp src: Oral Pulse Rate: 83  Resp: 18  BP: 111/66 mmHg Patient Position, if appropriate: Lying Oxygen Therapy SpO2: 93 % O2 Device: Nasal cannula O2 Flow Rate (L/min): 5 L/min Pulse Oximetry Type: Intermittent Pain: Pain Assessment Pain Assessment: No/denies pain  See FIM for current functional status  Therapy/Group: Individual Therapy  Tiawanna Luchsinger 06/05/2011, 3:50 PM

## 2011-06-05 NOTE — Progress Notes (Signed)
Physical Therapy Session Note  Patient Details  Name: [name] [name] MRN: [id number] Date of Birth: [birth year]  Today's Date: 06/05/2011 Time: 8295-6213 Time Calculation (min): 32 min   Skilled Therapeutic Interventions/Progress Updates:  Pt is most limited by her respiratory and generalized weakness.  O2 sats were maintained during exercise in sitting, but quickly desat with any activity requiring her to try to stand.  During transfer to get back to bed pt decreased to 85% and took approx 3 min to get back to 92%.  Pt will also need to be consistently pushed in therapy as she seems to not understand why she is being asked to be active when she is so weak. Therapy Documentation Precautions:  Precautions Precautions: Fall Required Braces or Orthoses: No Restrictions Weight Bearing Restrictions: No Pain: Pain Assessment Pain Assessment: No/denies pain Mobility:  Attempted standing x2, unable the first try, second time with mod@, able to stay up 30 sec. Before needing to return to sitting.  Squat pivot back to be with max@.  Sit to supine with close @  Exercises:  Pt performed 10 reps of LE exercise sitting in w/c.  Marching, LAQ, hip abduction/adduction, ankle pumps.  See FIM for current functional status  Therapy/Group: Individual Therapy  [name] 06/05/2011, 4:12 PM

## 2011-06-05 NOTE — Progress Notes (Signed)
Physical Therapy Assessment and Plan  Patient Details  Name: [name] [name] MRN: [id number] Date of Birth: [birth year]  PT Diagnosis: Abnormal posture, Abnormality of gait, Difficulty walking and Muscle weakness Rehab Potential: Good ELOS: ~2 weeks   Today's Date: 06/05/2011 Time: 4696-2952 Time Calculation (min): 67 min  Problem List:  Patient Active Problem List  Diagnoses  . HYPERLIPIDEMIA  . TOBACCO ABUSE  . GLAUCOMA  . HYPERTENSION  . SUPRAVENTRICULAR TACHYCARDIA  . CEREBROVASCULAR DISEASE  . ABDOMINAL AORTIC ANEURYSM  . COPD  . CAD  . VENTRICULAR TACHYCARDIA  . Acute-on-chronic respiratory failure  . Acute exacerbation of chronic obstructive pulmonary disease (COPD)  . SVT (supraventricular tachycardia)  . Anxiety disorder  . Physical deconditioning    Past Medical History:  Past Medical History  Diagnosis Date  . Hypertension   . Hyperlipidemia   . Cerebrovascular disease, unspecified   . Unspecified glaucoma   . Obesity, unspecified   . COPD (chronic obstructive pulmonary disease)   . Coronary artery disease     cath 04/06/10: LAD occluded with R-L collats; med Rx WTC....probable V. Tach...med Rx  . Ventricular tachycardia   . SVT (supraventricular tachycardia)   . AAA (abdominal aortic aneurysm)    Past Surgical History:  Past Surgical History  Procedure Date  . Cystoscopy   . Bladder tumor excision     resection of 5 bladder, and fulguration of 1 small bladder tumor  . Cystoscopy     cold cup bladder biopsy of five tumors, and fulguration of one tumor  . Lung surgery     Remote right lung  . Tubal ligation     Bilateral    Assessment & Plan Clinical Impression: Patient is a 71 y.o. year old female with recent admission to the hospital on 05/15/11 with COPD exacerbation.  Patient transferred to CIR on 06/04/2011 . She is COPD-oxygen dependant 2.5 liters,was intubated in ED. Found to be in Acute-on-chronic respiratory failure and acute exacerbation of COPD  in setting of possible CAP. Extubated on 03/22. Did have issues with agitation due to encephalopathy/ anxiety. Patient developed PSVT and started on digoxin, diltiazem and amiodarone for rate control. Therapies initiated and patient noted to be deconditioned and anxious- requiring encouragement for participation.  During evaluation pt was slightly resistant to doing anything more than bed exercises. Pt is currently at very low level with endurance, desaturating to SpO2 = 81% three times during transfers and ambulation. Pt able to increase SpO2 to >90% within 3 min.She is very weak and unable to safely control herself to a chair requiring moderate assist as well as chair immediately behind for safety.     Patient currently requires mod with mobility secondary to decreased cardiorespiratoy endurance and weakness. Prior to hospitalization, patient was Independent with mobility and lived with Spouse in a Mobile home home.  Home access is 5Stairs to enter.  Patient will benefit from skilled PT intervention to maximize safe functional mobility for planned discharge home with 24 hour supervision.  Anticipate patient will benefit from follow up HH at discharge.  PT - End of Session Activity Tolerance: Tolerates < 10 min activity with changes in vital signs Endurance Deficit: Yes Endurance Deficit Description: decreased oxygen saturation on high flow of oxygen PT Assessment Rehab Potential: Good Barriers to Discharge: None PT Plan PT Frequency: 1-2 X/day, 60-90 minutes Estimated Length of Stay: ~2 weeks PT Treatment/Interventions: Ambulation/gait training;Balance/vestibular training;Community reintegration;Disease management/prevention;DME/adaptive equipment instruction;Functional mobility training;Patient/family education;Stair training;Therapeutic Activities;Therapeutic Exercise;UE/LE Strength taining/ROM PT Recommendation Follow  Up Recommendations: Home health PT Equipment Recommended: 3 in 1 bedside  comode;Tub/shower seat;Rolling walker with 5" wheels Equipment Details: May need wheelchair for long distances pending progress.  PT Evaluation Precautions/Restrictions Precautions Precautions: Fall Required Braces or Orthoses: No Restrictions Weight Bearing Restrictions: No  Pain Pain Assessment Pain Assessment: No/denies pain Pain Score: 0-No pain Faces Pain Scale: No hurt Home Living/Prior Functioning Home Living Lives With: Spouse Receives Help From: Other (Comment) (housekeeper for cleaning) Type of Home: Mobile home Home Layout: One level Home Access: Stairs to enter Entrance Stairs-Rails: Doctor, general practice of Steps: 5 Bathroom Shower/Tub: Forensic scientist: Standard Home Adaptive Equipment: None Prior Function Level of Independence: Independent with basic ADLs;Independent with transfers;Independent with gait;Needs assistance with homemaking Light Housekeeping: Moderate Driving: Yes Vocation: Retired Comments: Solicitor, likes cross word puzzles, cards, loves to [employer] Vision/Perception  Vision - History Baseline Vision: No visual deficits Visual History: Glaucoma (patients report) Patient Visual Report: No change from baseline Perception Perception: Within Functional Limits Praxis Praxis: Intact  Cognition Overall Cognitive Status: Appears within functional limits for tasks assessed Arousal/Alertness: Awake/alert Orientation Level: Oriented X4 Sensation Coordination Gross Motor Movements are Fluid and Coordinated: Yes Fine Motor Movements are Fluid and Coordinated: Yes  Locomotion  Ambulation Ambulation: Yes Ambulation/Gait Assistance: 4: Min assist Ambulation Distance (Feet): 1.5 Feet (2-3 steps) Assistive device: Rolling walker Ambulation/Gait Assistance Details: Tactile cues for posture;Visual cues for safe use of DME/AE Ambulation/Gait Assistance Details (indicate cue type and reason): Attempted with Hand hold  assist, pt unable to perform and sat down immediately. Attempted again with RW. Chair has to be immediately behind pt as she has to sit with short notice.       Balance  Anticipate poor balance given deconditioned state.  Extremity Assessment  RUE Assessment RUE Assessment: Within Functional Limits (weak throughout) LUE Assessment LUE Assessment: Within Functional Limits (weak throughout) RLE Assessment RLE Assessment: Exceptions to [facility] RLE AROM (degrees) Overall AROM Right Lower Extremity: Within functional limits for tasks assessed RLE Strength RLE Overall Strength Comments: grossly 3+/5, functionally very weak. LLE Assessment LLE Assessment: Exceptions to WFL LLE AROM (degrees) Overall AROM Left Lower Extremity: Within functional limits for tasks assessed LLE Strength LLE Overall Strength Comments: grossly 3+/5, funtionally very weak  See FIM for current functional status Refer to Care Plan for Long Term Goals  Recommendations for other services: None  Discharge Criteria: Patient will be discharged from PT if patient refuses treatment 3 consecutive times without medical reason, if treatment goals not met, if there is a change in medical status, if patient makes no progress towards goals or if patient is discharged from hospital.  The above assessment, treatment plan, treatment alternatives and goals were discussed and mutually agreed upon: by family  [name] 06/05/2011, 1:48 PM

## 2011-06-05 NOTE — Progress Notes (Signed)
Patient ID: [name] [name], female   DOB: 1940-11-03, 71 y.o.   MRN: [id number] Subjective/Complaints: Review of Systems  Respiratory: Positive for cough and shortness of breath.   Psychiatric/Behavioral: The patient is nervous/anxious and has insomnia.   All other systems reviewed and are negative.  had anxiety last night centering around her breathing and "oxygen sats".  Nervous about therapies today   Objective: Vital Signs: Blood pressure 101/65, pulse 73, temperature 98.4 F (36.9 C), temperature source Oral, resp. rate 18, height 5\' 5"  (1.651 m), weight 72.3 kg (159 lb 6.3 oz), SpO2 90.00%. Dg Chest Port 1 View  06/04/2011  *RADIOLOGY REPORT*  Clinical Data: Respiratory failure.  PORTABLE CHEST - 1 VIEW  Comparison: 05/28/2011  Findings: Left PICC line projects over the left chest wall with the tip likely in the left axillary vein, not definitively visualized on the prior study. There is hyperinflation of the lungs compatible with COPD.  Cardiomegaly.  Worsening aeration in the left lung base with atelectasis or infiltrate and small bilateral pleural effusions.  Vascular congestion.  IMPRESSION: Cardiomegaly, vascular congestion, COPD.  Increasing left basilar atelectasis or infiltrate.  Small bilateral effusions.  Original Report Authenticated By: Cyndie Chime, M.D.   No results found for this basename: WBC:2,HGB:2,HCT:2,PLT:2 in the last 72 hours  Basename 06/03/11 0825  NA 137  K 3.9  CL 96  CO2 33*  GLUCOSE 83  BUN 18  CREATININE 0.53  CALCIUM 9.1   CBG (last 3)   Basename 06/03/11 1233 06/03/11 0817 06/02/11 2137  GLUCAP 122* 92 109*    Wt Readings from Last 3 Encounters:  06/04/11 72.3 kg (159 lb 6.3 oz)  06/03/11 71 kg (156 lb 8.4 oz)  04/01/11 72.576 kg (160 lb)    Physical Exam:  General appearance: alert, cooperative and mild distress Head: Normocephalic, without obvious abnormality, atraumatic Eyes: conjunctivae/corneas clear. PERRL, EOM's intact. Fundi  benign. Ears: normal TM's and external ear canals both ears Nose: Nares normal. Septum midline. Mucosa normal. No drainage or sinus tenderness. Throat: lips, mucosa, and tongue normal; teeth and gums normal Neck: no adenopathy, no carotid bruit, no JVD, supple, symmetrical, trachea midline and thyroid not enlarged, symmetric, no tenderness/mass/nodules Back: symmetric, no curvature. ROM normal. No CVA tenderness. Resp: diminished breath sounds bilaterally Cardio: regular rate and rhythm, S1, S2 normal, no murmur, click, rub or gallop GI: soft, non-tender; bowel sounds normal; no masses,  no organomegaly Extremities: extremities normal, atraumatic, no cyanosis or edema Pulses: 2+ and symmetric Skin: Skin color, texture, turgor normal. No rashes or lesions Neurologic: Grossly normal generalized weakness proximal lower est predominantly.  No gross sensory findings. Cognitive deficits Incision/Wound:  Psych: remains anxious but overall pleasant and cooperative   Assessment/Plan: 1. Functional deficits secondary to severe deconditioning related to respiratory failure which require 3+ hours per day of interdisciplinary therapy in a comprehensive inpatient rehab setting. Physiatrist is providing close team supervision and 24 hour management of active medical problems listed below. Physiatrist and rehab team continue to assess barriers to discharge/monitor patient progress toward functional and medical goals. FIM:       FIM - Toileting Toileting steps completed by patient: Adjust clothing prior to toileting;Performs perineal hygiene;Adjust clothing after toileting           Comprehension Comprehension Mode: Auditory Comprehension: 5-Understands complex 90% of the time/Cues < 10% of the time  Expression Expression Mode: Verbal Expression Assistive Devices: 6-Communication board  Social Interaction Social Interaction: 4-Interacts appropriately 75 - 89% of the  time - Needs  redirection for appropriate language or to initiate interaction.        1. DVT Prophylaxis/Anticoagulation: Mechanical: Sequential compression devices, below knee Bilateral lower extremities  2. Pain Management: will monitor for back/ knee pain with increased activity.  3. Mood: still anxious at times.  Gets anxious easily over breathing.  Needs positive reinforcement. Prn xanax 4. COPD: Reports incrased coughing today. CXR with some evidence of fluid overload. Encourage IS every hour when awake. Follow up CXR tomorrow. Pulse ox now in room to follow sats. 5. PSVT: Resolved on amiodarone bid, digoxin and cardizem.  6. Frequency: Check ua/ucs. Required I and O cath last night. Continue voiding trial. 7. Constipation: will start bowel program. Schedule senna at bedtime  LOS (Days) 1 A FACE TO FACE EVALUATION WAS PERFORMED  [name] 06/05/2011, 7:50 AM

## 2011-06-05 NOTE — Plan of Care (Signed)
Problem: Consults Goal: [facility] GENERAL PATIENT EDUCATION See Patient Education module for education specifics. Outcome: Not Progressing Pt anxiety will be reduced by time of d/c.

## 2011-06-05 NOTE — Progress Notes (Signed)
   CARE MANAGEMENT NOTE 06/05/2011  Patient:  [name] [name], [name] [name]   Account Number:  [id number]  Date Initiated:  05/16/2011  Documentation initiated by:  Ronny Flurry  Subjective/Objective Assessment:   DX: Acute-on-chronic respiratory failure in setting of CAP (community acquired pneumonia) and Acute exacerbation of chronic obstructive pulmonary disease (COPD), intubated     Action/Plan:   Anticipated DC Date:  05/27/2011   Anticipated DC Plan:  IP REHAB FACILITY  In-house referral  Clinical Social Worker      DC Associate Professor  CM consult      Choice offered to / List presented to:             Status of service:  Completed, signed off Medicare Important Message given?   (If response is "NO", the following Medicare IM given date fields will be blank) Date Medicare IM given:   Date Additional Medicare IM given:    Discharge Disposition:  IP REHAB FACILITY  Per UR Regulation:  Reviewed for med. necessity/level of care/duration of stay  If discussed at Long Length of Stay Meetings, dates discussed:    Comments:  06/04/11 Keenan Trefry,RN,BSN PT STABLE FOR DISCHARGE TO IP REHAB TODAY.  06/03/11 Darrion Wyszynski,RN,BSN 1600 PER GENIE WITH REHAB, BLUE MEDICARE HAS APPROVED IP REHAB STAY.  SPOKE WITH DR SIMONDS, HE STATES WE WILL PLAN FOR TENTATIVE DC TO REHAB TOMORROW AS LONG AS PT'S OXYGEN REQUIREMENTS REMAIN STABLE.  WILL FOLLOW UP IN AM. Phone #[phone]

## 2011-06-06 ENCOUNTER — Inpatient Hospital Stay (HOSPITAL_COMMUNITY): Payer: Medicare Other

## 2011-06-06 LAB — URINE CULTURE: Colony Count: 15000

## 2011-06-06 MED ORDER — POTASSIUM CHLORIDE CRYS ER 20 MEQ PO TBCR
40.0000 meq | EXTENDED_RELEASE_TABLET | Freq: Once | ORAL | Status: AC
  Administered 2011-06-06: 40 meq via ORAL
  Filled 2011-06-06: qty 2

## 2011-06-06 MED ORDER — BETHANECHOL CHLORIDE 10 MG PO TABS
10.0000 mg | ORAL_TABLET | Freq: Four times a day (QID) | ORAL | Status: DC
  Administered 2011-06-06 – 2011-06-08 (×9): 10 mg via ORAL
  Filled 2011-06-06 (×16): qty 1

## 2011-06-06 MED ORDER — FUROSEMIDE 10 MG/ML IJ SOLN
40.0000 mg | Freq: Once | INTRAMUSCULAR | Status: AC
  Administered 2011-06-06: 40 mg via INTRAVENOUS
  Filled 2011-06-06: qty 4

## 2011-06-06 MED ORDER — MOXIFLOXACIN HCL 400 MG PO TABS
400.0000 mg | ORAL_TABLET | Freq: Every day | ORAL | Status: AC
  Administered 2011-06-06 – 2011-06-13 (×8): 400 mg via ORAL
  Filled 2011-06-06 (×8): qty 1

## 2011-06-06 NOTE — Progress Notes (Signed)
Patient ID: [name] [name], female   DOB: [birth year], 71 y.o.   MRN: [id number] Patient ID: [name] [name], female   DOB: [birth year], 71 y.o.   MRN: [id number] Subjective/Complaints: Review of Systems  Respiratory: Positive for cough and shortness of breath.   Psychiatric/Behavioral: The patient is nervous/anxious and has insomnia.   All other systems reviewed and are negative.  "they worked me too hard yesterday"   Objective: Vital Signs: Blood pressure 115/64, pulse 74, temperature 98.6 F (37 C), temperature source Oral, resp. rate 17, height 5\' 5"  (1.651 m), weight 75 kg (165 lb 5.5 oz), SpO2 90.00%. Dg Chest Port 1 View  06/04/2011  *RADIOLOGY REPORT*  Clinical Data: Respiratory failure.  PORTABLE CHEST - 1 VIEW  Comparison: 05/28/2011  Findings: Left PICC line projects over the left chest wall with the tip likely in the left axillary vein, not definitively visualized on the prior study. There is hyperinflation of the lungs compatible with COPD.  Cardiomegaly.  Worsening aeration in the left lung base with atelectasis or infiltrate and small bilateral pleural effusions.  Vascular congestion.  IMPRESSION: Cardiomegaly, vascular congestion, COPD.  Increasing left basilar atelectasis or infiltrate.  Small bilateral effusions.  Original Report Authenticated By: Cyndie Chime, M.D.    Basename 06/05/11 0710  WBC 13.0*  HGB 11.4*  HCT 34.5*  PLT 133*    Basename 06/05/11 0710 06/03/11 0825  NA 139 137  K 3.4* 3.9  CL 98 96  CO2 35* 33*  GLUCOSE 91 83  BUN 18 18  CREATININE 0.51 0.53  CALCIUM 8.8 9.1   CBG (last 3)   Basename 06/03/11 1233 06/03/11 0817  GLUCAP 122* 92    Wt Readings from Last 3 Encounters:  06/06/11 75 kg (165 lb 5.5 oz)  06/03/11 71 kg (156 lb 8.4 oz)  04/01/11 72.576 kg (160 lb)    Physical Exam:  General appearance: alert, cooperative and mild distress Head: Normocephalic, without obvious abnormality, atraumatic Eyes: conjunctivae/corneas clear. PERRL, EOM's  intact. Fundi benign. Ears: normal TM's and external ear canals both ears Nose: Nares normal. Septum midline. Mucosa normal. No drainage or sinus tenderness. Throat: lips, mucosa, and tongue normal; teeth and gums normal Neck: no adenopathy, no carotid bruit, no JVD, supple, symmetrical, trachea midline and thyroid not enlarged, symmetric, no tenderness/mass/nodules Back: symmetric, no curvature. ROM normal. No CVA tenderness. Resp: diminished breath sounds bilaterally sats in the 88-92 range Cardio: regular rate and rhythm, S1, S2 normal, no murmur, click, rub or gallop GI: soft, non-tender; bowel sounds normal; no masses,  no organomegaly Extremities: extremities normal, atraumatic, no cyanosis or edema Pulses: 2+ and symmetric Skin: Skin color, texture, turgor normal. No rashes or lesions Neurologic: Grossly normal generalized weakness proximal lower est predominantly.  No gross sensory findings. Cognitive deficits Incision/Wound:  Psych: remains anxious.  A little negative at times   Assessment/Plan: 1. Functional deficits secondary to severe deconditioning related to respiratory failure which require 3+ hours per day of interdisciplinary therapy in a comprehensive inpatient rehab setting. Physiatrist is providing close team supervision and 24 hour management of active medical problems listed below. Physiatrist and rehab team continue to assess barriers to discharge/monitor patient progress toward functional and medical goals. FIM: FIM - Bathing Bathing Steps Patient Completed: Chest;Right Arm;Left Arm;Abdomen Bathing: 2: Max-Patient completes 3-4 80f 10 parts or 25-49%  FIM - Upper Body Dressing/Undressing Upper body dressing/undressing steps patient completed: Thread/unthread right sleeve of pullover shirt/dresss;Thread/unthread left sleeve of pullover shirt/dress;Put head through opening of  pull over shirt/dress;Pull shirt over trunk Upper body dressing/undressing: 4: Steadying  assist FIM - Lower Body Dressing/Undressing Lower body dressing/undressing steps patient completed: Thread/unthread right pants leg;Thread/unthread left pants leg;Pull pants up/down;Don/Doff right sock;Don/Doff left sock Lower body dressing/undressing: 4: Steadying Assist  FIM - Toileting Toileting steps completed by patient: Adjust clothing prior to toileting;Performs perineal hygiene;Adjust clothing after toileting Toileting: 0: Activity did not occur  FIM - Archivist Transfers: 0-Activity did not occur  FIM - Banker Devices: Bed rails Bed/Chair Transfer: 5: Supine > Sit: Supervision (verbal cues/safety issues);4: Bed > Chair or W/C: Min A (steadying Pt. > 75%)  FIM - Locomotion: Ambulation Locomotion: Ambulation Assistive Devices: Designer, industrial/product Ambulation/Gait Assistance: 4: Min assist Locomotion: Ambulation: 1: Travels less than 50 ft with minimal assistance (Pt.>75%)  Comprehension Comprehension Mode: Auditory Comprehension: 5-Understands complex 90% of the time/Cues < 10% of the time  Expression Expression Mode: Verbal Expression Assistive Devices: 6-Communication board Expression: 5-Expresses complex 90% of the time/cues < 10% of the time  Social Interaction Social Interaction: 4-Interacts appropriately 75 - 89% of the time - Needs redirection for appropriate language or to initiate interaction.  Problem Solving Problem Solving: 5-Solves basic problems: With no assist  Memory Memory: 5-Recognizes or recalls 90% of the time/requires cueing < 10% of the time  1. DVT Prophylaxis/Anticoagulation: Mechanical: Sequential compression devices, below knee Bilateral lower extremities  2. Pain Management: will monitor for back/ knee pain with increased activity.  3. Mood: still anxious at times.  Gets anxious easily over breathing.  Needs positive reinforcement. Prn xanax 4. COPD: acute on chronic effects.   -f/u cxr  just performed 5. PSVT: Resolved on amiodarone bid, digoxin and cardizem.  6. Frequency: ua negative, culture pending  -occasional retention  -continue voiding trial and prn caths  -she does have a hx of bladder CA 7. Constipation: started bowel program. Scheduled senna at bedtime  LOS (Days) 2 A FACE TO FACE EVALUATION WAS PERFORMED  Demani Mcbrien T 06/06/2011, 7:36 AM

## 2011-06-06 NOTE — Care Management Note (Signed)
Per State Regulation 482.30 This chart was reviewed for medical necessity with respect to the patient's Admission/Duration of stay. Pt participating txs-limited by her pulm issues.  Husband says he can see improvement.  Pt is motivated. Meds adjusted by Pulmonologist.  Brock Ra                 Nurse Care Manager              Next Review Date: 06/11/11

## 2011-06-06 NOTE — Progress Notes (Signed)
Physical Therapy Session Note  Patient Details  Name: [name] [name] MRN: [id number] Date of Birth: 08-02-1940  Today's Date: 06/06/2011 Time: 4098-1191 Time Calculation (min): 54 min  Skilled Therapeutic Interventions/Progress Updates:   Pt was gently encouraged to get up and get out of the room.  Needed supervision for supine to sit. Squat pivot to the w/c with mod @ and to the NuStep.  Performed Nustep x 1 min x2.  Pt continue to have significant issues with desating with activity even on 6L of O2, dropping into the mid 80s and taking 1-2 min to recover plus recovery time for generalized poor endurance. Pt scheduled for 3 hours of therapy in a row.  Cancelled last hour of the 3 in a row, due to pt unable to tolerate that schedule.  Will make adjustments to allow for rest breaks tomorrow and consider changing to 2 hours over 7 days.   Therapy Documentation Precautions:  Precautions Precautions: Fall Required Braces or Orthoses DO NOT USE: No Restrictions Weight Bearing Restrictions: No Pain: Pain Assessment Pain Assessment: 0-10 Pain Score: 0-No pain  See FIM for current functional status  Therapy/Group: Individual Therapy  [name] 06/06/2011, 11:34 AM

## 2011-06-06 NOTE — Progress Notes (Signed)
    Patient ID: [name] [name] MRN: [id number] DOB/AGE: [birth year] 71 y.o.   Lines, Tubes, etc:  ETT 3/20>>>3/22  ETT 3/24>>>3/27  Left IJ CVL 3/20>>> 3/29  Foley cath 4/3>>> 4/6  LUE PICC 4/5 >>   Microbiology:  Sputum 3/20>>>NF  BCx2 3/20>>>neg  UC 3/20>>>neg  MRSA PCR>>>neg   Antibiotics:  Rocephin 3/20>>>3/28  azithro 3/20>>>3/25  avelox 4/11 (PNA)>>>  Studies/Events:  Echo 3/20>>> EF 65-70% LVH  Consults: cardiology  Subjective Still very SOB with exertion. Little improvement from yesterday. Complaining of constipation.  Exam: BP 115/64  Pulse 74  Temp(Src) 98.6 F (37 C) (Oral)  Resp 17  Ht 5\' 5"  (1.651 m)  Wt 75 kg (165 lb 5.5 oz)  BMI 27.51 kg/m2  SpO2 90% 5-6 liters N/C  General - Chronically ill appearing, NAD.  Lungs - no wheeze, decreased on left Cardiac - RRR, no gallop.  Extremities - No pitting.  abd- non-tender GU: voids Neuro: intact but has generalized weakness.   PCXR: persistent LLL consolidation. Some improvement in aeration on left, but worsening aeration on right w/ more volume loss and bilateral  effusion.  Lab 06/05/11 0710 06/03/11 0825 06/02/11 0700  NA 139 137 139  K 3.4* 3.9 3.9  CL 98 96 95*  CO2 35* 33* 37*  BUN 18 18 22   CREATININE 0.51 0.53 0.54  GLUCOSE 91 83 72    Lab 06/05/11 0710 06/02/11 0700  HGB 11.4* 12.3  HCT 34.5* 37.3  WBC 13.0* 12.5*  PLT 133* 163     Acute exacerbation of chronic respiratory failure in the setting COPD exacerbation:  Still has significant oxygen requirements and left sided atelectasis/mucous plugging and volume loss. CXR with some improvement on Left but consolidation on left persists and has worsening aeration on right w/ bilateral effusions.  Recommendation: - Continue supplemental O2 - titrate carefully to maintain SpO2 88-94% (for now 5 liters rest and up to 6 for activity) - Cont nebulized Levalbuterol (due to PSVT) and also continue budesonide w/EZPAP.  Eventually can go back to  symbicort.  -aggressive pulmonary hygiene: continue flutter valve and IS -will slowly wean prednisone -Thought that we had ordered avelox on 4/9 for purulent sputum  And mucous plugging, but looks like it did not make her d/c list. Will go ahead and start now as she has little margin for error from a pulmonary stand-point.   -added mucinex.  -diurese today -repeat film in am.    Attending Addendum:  I have seen the patient, discussed the issues, test results and plans with Kreg Shropshire, NP. I agree with the Assessment and Plans as ammended above.   Levy Pupa, MD, PhD 06/06/2011, 2:40 PM [facility] [phone] or if no answer [phone]

## 2011-06-06 NOTE — Progress Notes (Signed)
Occupational Therapy Note  Patient Details  Name: [name] [name] MRN: [id number] Date of Birth: [birth year] Today's Date: 06/06/2011 1st Session: [id number] Pt denies pain Individual Therapy Pt missed 15 mins therapy secondary to fatigue Pt seated in w/c and required min encouragement to participate in bathing and dressing tasks.  O2 sats ranged between 81% and 92% on 5L O2.  Pt required multiple rest breaks throughout 45 min session.  Pt declined to bathe LB stating that she just wanted to get back in bed.  Pt stated that she was very anxious and that she could do better if "they can get this fluid out of me."  Pt declined shirt and pants desiring only to don a clean night gown.  Pt assisted to bed with RN at bedside administering medications.   2nd Session: Pt missed 30 minutes skilled OT services secondary to fatigue.  Lavone Neri [facility] 06/06/2011, 3:46 PM

## 2011-06-07 ENCOUNTER — Inpatient Hospital Stay (HOSPITAL_COMMUNITY): Payer: Medicare Other

## 2011-06-07 DIAGNOSIS — J9600 Acute respiratory failure, unspecified whether with hypoxia or hypercapnia: Secondary | ICD-10-CM

## 2011-06-07 DIAGNOSIS — F341 Dysthymic disorder: Secondary | ICD-10-CM

## 2011-06-07 DIAGNOSIS — Z5189 Encounter for other specified aftercare: Secondary | ICD-10-CM

## 2011-06-07 DIAGNOSIS — J449 Chronic obstructive pulmonary disease, unspecified: Secondary | ICD-10-CM

## 2011-06-07 DIAGNOSIS — R5381 Other malaise: Secondary | ICD-10-CM

## 2011-06-07 LAB — BASIC METABOLIC PANEL
Chloride: 96 mEq/L (ref 96–112)
Creatinine, Ser: 0.43 mg/dL — ABNORMAL LOW (ref 0.50–1.10)
GFR calc Af Amer: >90 mL/min (ref >90)
GFR calc non Af Amer: >90 mL/min (ref >90)

## 2011-06-07 MED ORDER — POTASSIUM CHLORIDE CRYS ER 10 MEQ PO TBCR
10.0000 meq | EXTENDED_RELEASE_TABLET | Freq: Two times a day (BID) | ORAL | Status: AC
  Administered 2011-06-07 – 2011-06-08 (×4): 10 meq via ORAL
  Filled 2011-06-07 (×4): qty 1

## 2011-06-07 NOTE — Progress Notes (Signed)
Social Work  Social Work Assessment and Plan  Patient Details  Name: [name] [name] MRN: [id number] Date of Birth: [birth year]  Today's Date: 06/07/2011  Problem List:  Patient Active Problem List  Diagnoses  . HYPERLIPIDEMIA  . TOBACCO ABUSE  . GLAUCOMA  . HYPERTENSION  . SUPRAVENTRICULAR TACHYCARDIA  . CEREBROVASCULAR DISEASE  . ABDOMINAL AORTIC ANEURYSM  . COPD  . CAD  . VENTRICULAR TACHYCARDIA  . Acute-on-chronic respiratory failure  . Acute exacerbation of chronic obstructive pulmonary disease (COPD)  . SVT (supraventricular tachycardia)  . Anxiety disorder  . Physical deconditioning   Past Medical History:  Past Medical History  Diagnosis Date  . Hypertension   . Hyperlipidemia   . Cerebrovascular disease, unspecified   . Unspecified glaucoma   . Obesity, unspecified   . COPD (chronic obstructive pulmonary disease)   . Coronary artery disease     cath 04/06/10: LAD occluded with R-L collats; med Rx WTC....probable V. Tach...med Rx  . Ventricular tachycardia   . SVT (supraventricular tachycardia)   . AAA (abdominal aortic aneurysm)    Past Surgical History:  Past Surgical History  Procedure Date  . Cystoscopy   . Bladder tumor excision     resection of 5 bladder, and fulguration of 1 small bladder tumor  . Cystoscopy     cold cup bladder biopsy of five tumors, and fulguration of one tumor  . Lung surgery     Remote right lung  . Tubal ligation     Bilateral   Social History:  reports that she quit smoking about 2 weeks ago. Her smoking use included Cigarettes. She has a 17.4 pack-year smoking history. She has never used smokeless tobacco. She reports that she does not drink alcohol or use illicit drugs.  Family / Support Systems Marital Status: Married How Long?: 45 years Patient Roles: Spouse;Parent Spouse/Significant Other: husband, Shellie Rogoff @ (H) [phone] or [phone] Children: Pt has two adult daughters living in [place] and [place].  Notes local  daughter has multiple health problems and cannot really offer much assistance. Other Supports: Pt's cousin was previously providing intermittent assist with housecleaning q 2-3 weeks; Husband's sister, Rivka Barbara, lives very close by and can also assit prn Anticipated Caregiver: Husband Ability/Limitations of Caregiver: no limitations Caregiver Availability: 24/7 Family Dynamics: Pt notes she relies on her husband for primary support - husband very attentive and encouraging.  Notes good relationships with other family as well.  Social History Preferred language: English Religion: Protestant Cultural Background: NA Education: quit HS at age 30; earned GED but no further school. Read: Yes Write: Yes Employment Status: Disabled Date Retired/Disabled/Unemployed: 2004 due to COPD Age Retired: 61  Fish farm manager Issues: none Guardian/Conservator: none   Abuse/Neglect Physical Abuse: Denies Verbal Abuse: Denies Sexual Abuse: Denies Exploitation of patient/patient's resources: Denies Self-Neglect: Denies  Emotional Status Pt's affect, behavior adn adjustment status: Pt lying in bed and struggling with respirations but able to provide information with short answers.  Reports she is frustrated with level of weakness, not voiding and feels she cannot complete 3 hours of tx per day.  Flat affect throughout and not responsive to light humor.  Husband at bedside, therefore, will complete Depression Screen with pt on Monday. Recent Psychosocial Issues: Pt and husband report no significant social stressors of recent - chronic health issues that have been limiting Pyschiatric History: None Substance Abuse History: None  Patient / Family Perceptions, Expectations & Goals Pt/Family understanding of illness & functional limitations: pt and  husband have basic awareness that admit was due to COPD exacerbation and need for CIR is deconditioning.  Husband has general knowledge of pt's current  functional limitations. Premorbid pt/family roles/activities: Husband did perform all "outside of home" management duties with both pt and husband sharing houshold management (but also had housecleaning support).  Pt did all of the cooking.  They have separate banking needs which they each managed themselves. Anticipated changes in roles/activities/participation: Husband will likely need to provide some increased physical assist support and home management support. Pt/family expectations/goals: Per husband, "She just needs to understand that she has to do (therapy)...and breath through her nose!".  both hopeful pt can regain some level of physical independence.  Community [employer] Agencies: None Premorbid Home Care/DME Agencies: Other (Comment) ([facility] supplies home O2) Transportation available at discharge: yes, husband  Resource referrals recommended:  (TBD - planning depression screen when pt w/o visitors)  Discharge Planning Living Arrangements: Spouse/significant other Support Systems: Spouse/significant other;Other relatives;Children Type of Residence: Private residence Insurance Resources: Medicare (**[employer]) Financial Resources: Social Security Financial Screen Referred: No Living Expenses: Own Money Management: Patient;Spouse Do you have any problems obtaining your medications?: No Home Management: shared between pt, spouse and hired housekeeper Patient/Family Preliminary Plans: Pt plans to return home with husband as primary caregiver.  Additional prn support from cousin, children and siblings in area Social Work Anticipated Follow Up Needs: HH/OP Expected length of stay: 1-2 weeks  Clinical Impression Very deconditioned woman lying in bed and showing some labored breathing.  Here with COPD exacerbation.  Some complaints of therapy schedule/ demands, yet, husband attempts to provide encouragement.  Husband supportive and available to provide 24/7  assist at home.    Amada Jupiter  Denai Caba 06/07/2011, 4:07 PM

## 2011-06-07 NOTE — Plan of Care (Signed)
02 sats from 79% to 87% on 5L N/C. Pt is a mouth breather, requires  frequent cues for proper breathing. Required I&O cath x 1 this shift. Voided unknown amount of urine in bedpan at end of shift. Currently on Urecholine 10mg . Small soft bowel movement x 2. Agreeable to soap sud enema to facilitate emptying. Can be anxious at times. Xanax 0.25mg  x 1 dose given. Minimum decreased in anxiety noted after medication.

## 2011-06-07 NOTE — Progress Notes (Addendum)
Subjective/Complaints: Review of Systems  Respiratory: Positive for cough and shortness of breath.   Psychiatric/Behavioral: The patient is nervous/anxious and has insomnia.   All other systems reviewed and are negative.  feels a little better today. Concerned about urinary retention.   Objective: Vital Signs: Blood pressure 123/71, pulse 75, temperature 98.3 F (36.8 C), temperature source Oral, resp. rate 20, height 5\' 5"  (1.651 m), weight 75 kg (165 lb 5.5 oz), SpO2 92.00%. Dg Chest 2 View  06/06/2011  *RADIOLOGY REPORT*  Clinical Data: Shortness of breath, COPD, smoker.  CHEST - 2 VIEW  Comparison: 06/04/2011  Findings: Left PICC line remains in stable position within the left axillary vein.  Cardiomegaly.  Left lower lobe atelectasis or consolidation.  Small bilateral pleural effusions, left greater than right.  Increasing right base atelectasis.  IMPRESSION: Stable left lower lobe atelectasis or consolidation.  Stable bilateral effusions.  Slight increase right base atelectasis.  Original Report Authenticated By: Cyndie Chime, M.D.    Basename 06/05/11 0710  WBC 13.0*  HGB 11.4*  HCT 34.5*  PLT 133*    Basename 06/05/11 0710  NA 139  K 3.4*  CL 98  CO2 35*  GLUCOSE 91  BUN 18  CREATININE 0.51  CALCIUM 8.8   CBG (last 3)  No results found for this basename: GLUCAP:3 in the last 72 hours  Wt Readings from Last 3 Encounters:  06/06/11 75 kg (165 lb 5.5 oz)  06/03/11 71 kg (156 lb 8.4 oz)  04/01/11 72.576 kg (160 lb)    Physical Exam:  General appearance: alert, cooperative and mild distress Head: Normocephalic, without obvious abnormality, atraumatic Eyes: conjunctivae/corneas clear. PERRL, EOM's intact. Fundi benign. Ears: normal TM's and external ear canals both ears Nose: Nares normal. Septum midline. Mucosa normal. No drainage or sinus tenderness. Throat: lips, mucosa, and tongue normal; teeth and gums normal Neck: no adenopathy, no carotid bruit, no JVD,  supple, symmetrical, trachea midline and thyroid not enlarged, symmetric, no tenderness/mass/nodules Back: symmetric, no curvature. ROM normal. No CVA tenderness. Resp: diminished breath sounds bilaterally sats in mid 90's. Mild resp distress Cardio: regular rate and rhythm, S1, S2 normal, no murmur, click, rub or gallop GI: soft, non-tender; bowel sounds normal; no masses,  no organomegaly Extremities: some extremity edema.  Pulses: 2+ and symmetric Skin: Skin color, texture, turgor normal. No rashes or lesions Neurologic: Grossly normal generalized weakness proximal lower est predominantly.  No gross sensory findings. Cognitive deficits Incision/Wound:  Psych: remains anxious.  A little negative at times   Assessment/Plan: 1. Functional deficits secondary to severe deconditioning related to respiratory failure which require 3+ hours per day of interdisciplinary therapy in a comprehensive inpatient rehab setting. Physiatrist is providing close team supervision and 24 hour management of active medical problems listed below. Physiatrist and rehab team continue to assess barriers to discharge/monitor patient progress toward functional and medical goals. FIM: FIM - Bathing Bathing Steps Patient Completed: Chest;Right Arm;Left Arm;Abdomen;Front perineal area Bathing: 3: Mod-Patient completes 5-7 83f 10 parts or 50-74%  FIM - Upper Body Dressing/Undressing Upper body dressing/undressing steps patient completed: Thread/unthread right sleeve of pullover shirt/dresss;Thread/unthread left sleeve of pullover shirt/dress;Pull shirt over trunk Upper body dressing/undressing: 4: Min-Patient completed 75 plus % of tasks FIM - Lower Body Dressing/Undressing Lower body dressing/undressing steps patient completed: Thread/unthread right pants leg;Thread/unthread left pants leg;Pull pants up/down;Don/Doff right sock;Don/Doff left sock Lower body dressing/undressing: 0: Wears gown/pajamas-no public  clothing  FIM - Toileting Toileting steps completed by patient: Adjust clothing prior to  toileting;Performs perineal hygiene;Adjust clothing after toileting Toileting: 0: Activity did not occur  FIM - Archivist Transfers: 0-Activity did not occur  FIM - Banker Devices: Bed rails Bed/Chair Transfer: 4: Sit > Supine: Min A (steadying pt. > 75%/lift 1 leg);3: Chair or W/C > Bed: Mod A (lift or lower assist)  FIM - Locomotion: Wheelchair Locomotion: Wheelchair: 1: Total Assistance/staff pushes wheelchair (Pt<25%) FIM - Locomotion: Ambulation Locomotion: Ambulation Assistive Devices: Designer, industrial/product Ambulation/Gait Assistance: 4: Min assist Locomotion: Ambulation: 0: Activity did not occur  Comprehension Comprehension Mode: Auditory Comprehension: 5-Understands complex 90% of the time/Cues < 10% of the time  Expression Expression Mode: Verbal Expression Assistive Devices: 6-Communication board Expression: 5-Expresses complex 90% of the time/cues < 10% of the time  Social Interaction Social Interaction: 4-Interacts appropriately 75 - 89% of the time - Needs redirection for appropriate language or to initiate interaction.  Problem Solving Problem Solving: 5-Solves basic problems: With no assist  Memory Memory: 5-Recognizes or recalls 90% of the time/requires cueing < 10% of the time  1. DVT Prophylaxis/Anticoagulation: Mechanical: Sequential compression devices, below knee Bilateral lower extremities  2. Pain Management: will monitor for back/ knee pain with increased activity.  3. Mood: still anxious at times.  Gets anxious easily over breathing.  Needs positive reinforcement. Prn xanax 4. COPD: acute on chronic effects.   -f/u cxr yesterday without a lot of change  -avelox re-initiated per [facility]  -lasix started with weights trending up  -repeat cxr pending   -she says she feels a little better this am 5. PSVT: Resolved on  amiodarone bid, digoxin and cardizem.  6. Frequency: ua negative, culture pending  -urecholine trial- i explained to her that with the diuresis, that her bladder will fill up more.  -continue voiding trial and prn caths, depending how much she retains, we may need to just put the foley back in  -she does have a hx of bladder CA 7. Constipation: started bowel program. Scheduled senna at bedtime  LOS (Days) 3 A FACE TO FACE EVALUATION WAS PERFORMED  Colton Tassin T 06/07/2011, 7:52 AM

## 2011-06-07 NOTE — Progress Notes (Signed)
Occupational Therapy Session Note  Patient Details  Name: MEGHANA TULLO MRN: [id number] Date of Birth: 07-Apr-1940  Today's Date: 06/07/2011 Time: 0865-7846 Time Calculation (min): 45 min Pt denied pain  Short Term Goals: Week 1:  OT Short Term Goal 1 (Week 1): Patient will maintain dynamic standing balance with min/steady assist using AD prn to increase independence with LB dressing OT Short Term Goal 2 (Week 1): Patient will complete toilet transfer with min assist using DME prn OT Short Term Goal 3 (Week 1): Tub/shower transfer using DME prn will be attempted with patient OT Short Term Goal 4 (Week 1): Patient will engage in grooming tasks at sink with set-up assist  Skilled Therapeutic Interventions/Progress Updates:    Pt requested to complete bathing and dressing tasks seated EOB.  Pt stated she feels better than previous day but still has not had a bowel movement and didn't want to leave the bed or don pants.  Pt sat EOB with supervision and HOB elevated.  Pt O2 sats ranged between 79% and 93% on 5L O2.  Pt requires multiple rest breaks throughout activity and session terminated with 15 minutes remaining secondary to fatigue.  Focus on activity tolerance and dynamic sitting balance.  Therapy Documentation Precautions:  Precautions Precautions: Fall Required Braces or Orthoses DO NOT USE: No Restrictions Weight Bearing Restrictions: No General: General Amount of Missed OT Time (min): 15 Minutes  See FIM for current functional status  Therapy/Group: Individual Therapy  Rich Brave 06/07/2011, 12:23 PM

## 2011-06-07 NOTE — Plan of Care (Signed)
Problem: RH BLADDER ELIMINATION Goal: RH STG MANAGE BLADDER WITH ASSISTANCE STG Manage Bladder With Assistance. Mod Independent Outcome: Not Progressing Currently requiring I&O cath

## 2011-06-07 NOTE — Progress Notes (Signed)
Patient ID: [name] [name] MRN: [id number] DOB/AGE: 11/07/1940 71 y.o.   Lines, Tubes, etc:  ETT 3/20>>>3/22  ETT 3/24>>>3/27  Left IJ CVL 3/20>>> 3/29  Foley cath 4/3>>> 4/6  LUE PICC 4/5 >>   Microbiology:  Sputum 3/20>>>NF  BCx2 3/20>>>neg  UC 3/20>>>neg  MRSA PCR>>>neg   Antibiotics:  Rocephin 3/20>>>3/28  azithro 3/20>>>3/25  avelox 4/11 (PNA)>>>  Studies/Events:  Echo 3/20>>> EF 65-70% LVH  Consults: cardiology  Subjective Still very SOB with exertion. Difficulty w therapy and with sitting up. Poor secretion clearance Exam: BP 123/71  Pulse 75  Temp(Src) 98.3 F (36.8 C) (Oral)  Resp 20  Ht 5\' 5"  (1.651 m)  Wt 75 kg (165 lb 5.5 oz)  BMI 27.51 kg/m2  SpO2 92% 5-6 liters N/C  General - Chronically ill appearing, NAD.  Lungs - no wheeze, decreased on left Cardiac - RRR, no gallop.  Extremities - No pitting.  abd- non-tender GU: voids Neuro: intact but has generalized weakness.   PCXR: slight improvement bibasilar aeration  Lab 06/07/11 0725 06/05/11 0710 06/03/11 0825  NA 137 139 137  K 3.8 3.4* 3.9  CL 96 98 96  CO2 32 35* 33*  BUN 18 18 18   CREATININE 0.43* 0.51 0.53  GLUCOSE 94 91 83    Lab 06/05/11 0710 06/02/11 0700  HGB 11.4* 12.3  HCT 34.5* 37.3  WBC 13.0* 12.5*  PLT 133* 163    Acute exacerbation of chronic respiratory failure in the setting COPD exacerbation:  Still has significant oxygen requirements and left sided atelectasis/mucous plugging and volume loss. Slightly better CXR 4/12. Largely due to her debilitation, weakness, atx.  Recommendation: - Continue supplemental O2 - titrate carefully to maintain SpO2 88-94%  - Cont nebulized Levalbuterol (due to PSVT) and also continue budesonide w/EZPAP.  Eventually can go back to symbicort.  -aggressive pulmonary hygiene: continue flutter valve and IS -will slowly wean prednisone -avelox ordered 4/10 -mucinex.  -follow CXR Monday  Please call if we can help over the weekend  Levy Pupa, MD, PhD 06/07/2011, 3:26 PM [facility] [phone] or if no answer [phone]

## 2011-06-07 NOTE — Progress Notes (Signed)
Physical Therapy Note  Patient Details  Name: [name] [name] MRN: [id number] Date of Birth: April 15, 1940 Today's Date: 06/07/2011  14:45-15:15 individual therapy pt denied pain but severe dyspnea.  Pt refused OOB, or any type of mobility, she only agreed to ROM. Performed bil. UE shoulder flexion/ chest expansion, hamstring and heel cord stretches, heel slides, and SAQ x 10 each.   Julian Reil 06/07/2011, 3:42 PM

## 2011-06-07 NOTE — Plan of Care (Signed)
Problem: RH BOWEL ELIMINATION Goal: RH STG MANAGE BOWEL WITH ASSISTANCE STG Manage Bowel with Assistance. Mod Independent Outcome: Not Progressing Not progressing. Currently requiring a soap sud enema

## 2011-06-08 DIAGNOSIS — R5381 Other malaise: Secondary | ICD-10-CM

## 2011-06-08 DIAGNOSIS — Z5189 Encounter for other specified aftercare: Secondary | ICD-10-CM

## 2011-06-08 DIAGNOSIS — J9600 Acute respiratory failure, unspecified whether with hypoxia or hypercapnia: Secondary | ICD-10-CM

## 2011-06-08 DIAGNOSIS — J449 Chronic obstructive pulmonary disease, unspecified: Secondary | ICD-10-CM

## 2011-06-08 DIAGNOSIS — F341 Dysthymic disorder: Secondary | ICD-10-CM

## 2011-06-08 NOTE — Progress Notes (Signed)
Patient ID: [name] [name], female   DOB: [birth year], 71 y.o.   MRN: [id number] Subjective/Complaints: Review of Systems  Respiratory: Positive for cough and shortness of breath.   Psychiatric/Behavioral: The patient is nervous/anxious and has insomnia.   All other systems reviewed and are negative.   No new issues  Objective: Vital Signs: Blood pressure 157/70, pulse 89, temperature 98.8 F (37.1 C), temperature source Oral, resp. rate 20, height 5\' 5"  (1.651 m), weight 75 kg (165 lb 5.5 oz), SpO2 93.00%. Dg Chest Port 1 View  06/07/2011  *RADIOLOGY REPORT*  Clinical Data: Severe shortness of breath, evaluate for pleural effusion and consolidation  PORTABLE CHEST - 1 VIEW  Comparison: 06/06/2011; 06/04/2011; 05/28/2011  Findings: Grossly unchanged cardiac silhouette and mediastinal contours with persistent obscuration of the left heart border secondary to left-sided pleural effusion and unchanged left basilar heterogeneous / consolidative opacities.  Unchanged small right- sided pleural effusion and right basilar opacities.  No new focal airspace opacities.  Unchanged positioning of the left upper extremity approach PICC line with tip overlying the left axilla. No pneumothorax.  Unchanged bones.  IMPRESSION: 1.  Unchanged small bilateral effusions and basilar opacities, left greater than right, atelectasis versus infiltrate.  2.  Left upper extremity approach PICC line tip remains over the left axilla.  Original Report Authenticated By: Waynard Reeds, M.D.   No results found for this basename: WBC:2,HGB:2,HCT:2,PLT:2 in the last 72 hours  Basename 06/07/11 0725  NA 137  K 3.8  CL 96  CO2 32  GLUCOSE 94  BUN 18  CREATININE 0.43*  CALCIUM 8.8   CBG (last 3)  No results found for this basename: GLUCAP:3 in the last 72 hours  Wt Readings from Last 3 Encounters:  06/06/11 75 kg (165 lb 5.5 oz)  06/03/11 71 kg (156 lb 8.4 oz)  04/01/11 72.576 kg (160 lb)    Physical Exam:  General  appearance: alert, cooperative and mild distress Head: Normocephalic, without obvious abnormality, atraumatic Eyes:  Ears:  Nose: Nares normal. Septum midline. Mucosa normal. No drainage or sinus tenderness. Throat: lips, mucosa, and tongue normal; teeth and gums normal Neck: no adenopathy, no carotid bruit, no JVD, supple, symmetrical, trachea midline and thyroid not enlarged, symmetric, no tenderness/mass/nodules Back: symmetric, no curvature. ROM normal. No CVA tenderness. Resp: diminished breath sounds bilaterally sats in the 88-92 range Cardio: regular rate and rhythm, S1, S2 normal, no murmur, click, rub or gallop GI: soft, non-tender; bowel sounds normal; no masses,  no organomegaly Extremities: extremities normal, atraumatic, no cyanosis or edema Pulses: 2+ and symmetric Skin: Skin color, texture, turgor normal. No rashes or lesions Neurologic: Grossly normal generalized weakness proximal lower est predominantly.  No gross sensory findings. Cognitive deficits Incision/Wound:  Psych: remains anxious.  A little negative at times   Assessment/Plan: 1. Functional deficits secondary to severe deconditioning related to respiratory failure which require 3+ hours per day of interdisciplinary therapy in a comprehensive inpatient rehab setting. Physiatrist is providing close team supervision and 24 hour management of active medical problems listed below. Physiatrist and rehab team continue to assess barriers to discharge/monitor patient progress toward functional and medical goals. FIM: FIM - Bathing Bathing Steps Patient Completed: Chest;Right Arm;Left Arm;Abdomen;Front perineal area Bathing: 3: Mod-Patient completes 5-7 70f 10 parts or 50-74%  FIM - Upper Body Dressing/Undressing Upper body dressing/undressing steps patient completed: Thread/unthread right sleeve of pullover shirt/dresss;Thread/unthread left sleeve of pullover shirt/dress;Put head through opening of pull over  shirt/dress Upper body dressing/undressing: 4:  Min-Patient completed 75 plus % of tasks FIM - Lower Body Dressing/Undressing Lower body dressing/undressing steps patient completed: Thread/unthread right pants leg;Thread/unthread left pants leg;Pull pants up/down;Don/Doff right sock;Don/Doff left sock Lower body dressing/undressing: 0: Wears gown/pajamas-no public clothing  FIM - Toileting Toileting steps completed by patient: Adjust clothing prior to toileting;Performs perineal hygiene;Adjust clothing after toileting Toileting: 1: Total-Patient completed zero steps, helper did all 3  FIM - Archivist Transfers: 0-Activity did not occur  FIM - Banker Devices: Bed rails Bed/Chair Transfer: 5: Supine > Sit: Supervision (verbal cues/safety issues);5: Sit > Supine: Supervision (verbal cues/safety issues)  FIM - Locomotion: Wheelchair Locomotion: Wheelchair: 1: Total Assistance/staff pushes wheelchair (Pt<25%) FIM - Locomotion: Ambulation Locomotion: Ambulation Assistive Devices: Designer, industrial/product Ambulation/Gait Assistance: 4: Min assist Locomotion: Ambulation: 0: Activity did not occur  Comprehension Comprehension Mode: Auditory Comprehension: 5-Understands complex 90% of the time/Cues < 10% of the time  Expression Expression Mode: Verbal Expression Assistive Devices: 6-Communication board Expression: 5-Expresses complex 90% of the time/cues < 10% of the time  Social Interaction Social Interaction: 6-Interacts appropriately with others with medication or extra time (anti-anxiety, antidepressant).  Problem Solving Problem Solving: 5-Solves basic problems: With no assist  Memory Memory: 5-Recognizes or recalls 90% of the time/requires cueing < 10% of the time  1. DVT Prophylaxis/Anticoagulation: Mechanical: Sequential compression devices, below knee Bilateral lower extremities  2. Pain Management: will monitor for back/ knee  pain with increased activity.  3. Mood: still anxious at times.  Gets anxious easily over breathing.  Needs positive reinforcement. Prn xanax 4. COPD: acute on chronic effects.   -f/u cxr just performed 5. PSVT: Resolved on amiodarone bid, digoxin and cardizem.  6. Frequency: ua negative, culture pending  -occasional retention  -continue voiding trial and prn caths  -she does have a hx of bladder CA 7. Constipation: started bowel program. Scheduled senna at bedtime  LOS (Days) 4 A FACE TO FACE EVALUATION WAS PERFORMED  Shellia Hartl E 06/08/2011, 8:17 AM

## 2011-06-08 NOTE — Plan of Care (Signed)
Problem: RH BOWEL ELIMINATION Goal: RH STG MANAGE BOWEL WITH ASSISTANCE STG Manage Bowel with Assistance. Mod Independent  Outcome: Not Progressing Patient incontinent x2 today- required max assist to change brief  Problem: RH BLADDER ELIMINATION Goal: RH STG MANAGE BLADDER WITH ASSISTANCE STG Manage Bladder With Assistance. Mod Independent  Outcome: Not Progressing Patient had to be cathed this afternoon when unable to void Goal: RH STG MANAGE BLADDER WITH MEDICATION WITH ASSISTANCE STG Manage Bladder With Medication With Assistance.  Outcome: Not Progressing Patient still required to be cathed this afternoon

## 2011-06-08 NOTE — Progress Notes (Signed)
Patient's oxygen saturation has been in low to mid 90s this shift on 5L [place]. Patient received 1 tylenol for headache at 1710 for pain with some relief. Patient required to be cathed at 1400 for 400 after being scanned for 359 and was unable to void. Patient has had 1 continent bowel movement and 2 incontinent bowel movements in brief. Continue with plan of care.

## 2011-06-08 NOTE — Progress Notes (Signed)
Occupational Therapy Session Note  Patient Details  Name: [name] [name] MRN: [id number] Date of Birth: 1940/11/30  Today's Date: 06/08/2011  Time: 0800-0855  1st session) Time Calculation (min): 55 min  Time1300-1340Time Calculation (min): 40 min  (2nd session)   Short Term Goals: Week 1:  OT Short Term Goal 1 (Week 1): Patient will maintain dynamic standing balance with min/steady assist using AD prn to increase independence with LB dressing OT Short Term Goal 2 (Week 1): Patient will complete toilet transfer with min assist using DME prn OT Short Term Goal 3 (Week 1): Tub/shower transfer using DME prn will be attempted with patient OT Short Term Goal 4 (Week 1): Patient will engage in grooming tasks at sink with set-up assist  Skilled Therapeutic Interventions/Progress Updates:   1st session:   Addressed bed mobility, bathing, and dressing at EOB, breathing sterategies (diaphramatic breathing).    2nd session:  Pt lying on bedpan upon OT arrival.  Encouraged pt to get on [facility] but refused stating " I am moving my bowels.".  Performed UE exercises.  Reiterated  the breathing exercises.     Therapy Documentation Precautions:  Precautions Precautions: Fall Required Braces or Orthoses DO NOT USE: No Restrictions Weight Bearing Restrictions: No    Pain:  none        See FIM for current functional status  Therapy/Group: Individual Therapy  Humberto Seals 06/08/2011, 8:52 AM

## 2011-06-08 NOTE — Progress Notes (Signed)
Physical Therapy Note  Patient Details  Name: [name] [name] MRN: [id number] Date of Birth: [birth year] Today's Date: 06/08/2011 Time: 1030-1100 (30') Pain: None  Therapeutic Exercise: (15') B LE's in supine and sitting at EOB Therapeutic Activity: (15') Bed Mobility with Min-A for rolling wit h use of rails, TA for scooting to [facility], supine<->sit with Mod-A, Transfer Training sit<->stand into RW x 2 with min-A.  O2 Sats remaining > 92% throughout tx session except for 2x of 88% but this immediately improved with pursed lip breathing   Individual Therapy Session   Rex Kras 06/08/2011, 10:37 AM

## 2011-06-08 NOTE — Progress Notes (Signed)
Physical Therapy Note  Patient Details  Name: [name] [name] MRN: [id number] Date of Birth: 11-Dec-1940 Today's Date: 06/08/2011 Time: 1430-1530 (60') Pain: None  Patient expressing extreme fatigue and initially not wanting to participate but with encouragement, was willing.  Patient needing increased time for each task followed by 5 minutes of rest.  Patient able to perform "pursed lip breathing" with use of small straw cut in half. Precautions; Fall Risk, Anxiety/Fear of Falling and O2 Sats and HR  Monitored throughout Tx session.  Therapeutic Exercise; (30') B LE's in supine and sitting at EOB  Therapeutic Activity; (30') Bed Mobility with Min-A for rolling with use of rails, supine<->sit with Mod-A, and TA for scooting to [facility], Transfer Training sit<->stand into HHA x 2 with Mod-A and blocking one knee (standing time of < one minute)  O2 Sats remaining > 92% throughout tx session except for 2x of 78% post-standing exercise but this immediately improved with pursed lip breathing   Individual Therapy Session   Rex Kras 06/08/2011, 2:50 PM

## 2011-06-09 ENCOUNTER — Inpatient Hospital Stay (HOSPITAL_COMMUNITY): Payer: Medicare Other

## 2011-06-09 MED ORDER — PREDNISONE 10 MG PO TABS
10.0000 mg | ORAL_TABLET | Freq: Every day | ORAL | Status: DC
  Administered 2011-06-09: 10 mg via ORAL
  Filled 2011-06-09 (×3): qty 1

## 2011-06-09 NOTE — Progress Notes (Signed)
Patient ID: [name] [name], female   DOB: [birth year], 71 y.o.   MRN: [id number] Subjective/Complaints: Review of Systems  Respiratory: Positive for cough and shortness of breath.   Psychiatric/Behavioral: The patient is nervous/anxious and has insomnia.   All other systems reviewed and are negative.     Objective: Vital Signs: Blood pressure 128/72, pulse 73, temperature 98.1 F (36.7 C), temperature source Oral, resp. rate 20, height 5\' 5"  (1.651 m), weight 75 kg (165 lb 5.5 oz), SpO2 100.00%. No results found. No results found for this basename: WBC:2,HGB:2,HCT:2,PLT:2 in the last 72 hours  Basename 06/07/11 0725  NA 137  K 3.8  CL 96  CO2 32  GLUCOSE 94  BUN 18  CREATININE 0.43*  CALCIUM 8.8   CBG (last 3)  No results found for this basename: GLUCAP:3 in the last 72 hours  Wt Readings from Last 3 Encounters:  06/06/11 75 kg (165 lb 5.5 oz)  06/03/11 71 kg (156 lb 8.4 oz)  04/01/11 72.576 kg (160 lb)    Physical Exam:  General appearance: alert, cooperative and mild distress Head: Normocephalic, without obvious abnormality, atraumatic Eyes:  Ears:  Nose: Nares normal. Septum midline. Mucosa normal. No drainage or sinus tenderness. Throat: lips, mucosa, and tongue normal; teeth and gums normal Neck: no adenopathy, no carotid bruit, no JVD, supple, symmetrical, trachea midline and thyroid not enlarged, symmetric, no tenderness/mass/nodules Back: symmetric, no curvature. ROM normal. No CVA tenderness. Resp: diminished breath sounds bilaterally sats in the 88-92 range Cardio: regular rate and rhythm, S1, S2 normal, no murmur, click, rub or gallop GI: soft, non-tender; bowel sounds normal; no masses,  no organomegaly Extremities: extremities normal, atraumatic, no cyanosis or edema Pulses: 2+ and symmetric Skin: Skin color, texture, turgor normal. No rashes or lesions Neurologic: Grossly normal generalized weakness proximal lower est predominantly.  No gross sensory  findings. Cognitive deficits Incision/Wound:  Psych: remains anxious.  A little negative at times   Assessment/Plan: 1. Functional deficits secondary to severe deconditioning related to respiratory failure which require 3+ hours per day of interdisciplinary therapy in a comprehensive inpatient rehab setting. Physiatrist is providing close team supervision and 24 hour management of active medical problems listed below. Physiatrist and rehab team continue to assess barriers to discharge/monitor patient progress toward functional and medical goals. FIM: FIM - Bathing Bathing Steps Patient Completed: Chest;Right Arm;Left Arm;Abdomen;Right upper leg;Left upper leg Bathing: 3: Mod-Patient completes 5-7 72f 10 parts or 50-74%  FIM - Upper Body Dressing/Undressing Upper body dressing/undressing steps patient completed: Thread/unthread right sleeve of pullover shirt/dresss;Thread/unthread left sleeve of pullover shirt/dress;Put head through opening of pull over shirt/dress Upper body dressing/undressing: 0: Wears gown/pajamas-no public clothing FIM - Lower Body Dressing/Undressing Lower body dressing/undressing steps patient completed: Thread/unthread right pants leg;Thread/unthread left pants leg;Pull pants up/down;Don/Doff right sock;Don/Doff left sock Lower body dressing/undressing: 0: Wears gown/pajamas-no public clothing  FIM - Toileting Toileting steps completed by patient: Adjust clothing prior to toileting;Performs perineal hygiene;Adjust clothing after toileting Toileting: 1: Total-Patient completed zero steps, helper did all 3  FIM - Archivist Transfers: 0-Activity did not occur  FIM - Banker Devices: Bed rails Bed/Chair Transfer: 5: Supine > Sit: Supervision (verbal cues/safety issues);5: Sit > Supine: Supervision (verbal cues/safety issues)  FIM - Locomotion: Wheelchair Locomotion: Wheelchair: 1: Total Assistance/staff pushes  wheelchair (Pt<25%) FIM - Locomotion: Ambulation Locomotion: Ambulation Assistive Devices: Designer, industrial/product Ambulation/Gait Assistance: 4: Min assist Locomotion: Ambulation: 0: Activity did not occur  Comprehension Comprehension Mode: Auditory  Comprehension: 5-Understands complex 90% of the time/Cues < 10% of the time  Expression Expression Mode: Verbal Expression Assistive Devices: 6-Communication board Expression: 5-Expresses complex 90% of the time/cues < 10% of the time  Social Interaction Social Interaction: 6-Interacts appropriately with others with medication or extra time (anti-anxiety, antidepressant).  Problem Solving Problem Solving: 5-Solves basic problems: With no assist  Memory Memory: 5-Recognizes or recalls 90% of the time/requires cueing < 10% of the time  1. DVT Prophylaxis/Anticoagulation: Mechanical: Sequential compression devices, below knee Bilateral lower extremities  2. Pain Management: will monitor for back/ knee pain with increased activity.  3. Mood: still anxious at times.  Gets anxious easily over breathing.  Needs positive reinforcement. Prn xanax 4. COPD: acute on chronic effects. On prednisone at home restart at 10mg   -f/u cxr just performed 4/11 was neg for acute changes, has L pleural effusion 5. PSVT: Resolved on amiodarone bid, digoxin and cardizem.  6. Frequency: ua negative, culture neg  -occasional retention however urecholine may be causing increased resp distress  -continue voiding trial and prn caths  -she does have a hx of bladder CA 7. Constipation: started bowel program. Scheduled senna at bedtime  LOS (Days) 5 A FACE TO FACE EVALUATION WAS PERFORMED  Okema Rollinson E 06/09/2011, 6:56 AM

## 2011-06-09 NOTE — Plan of Care (Signed)
Problem: RH BOWEL ELIMINATION Goal: RH STG MANAGE BOWEL WITH ASSISTANCE STG Manage Bowel with Assistance. Mod Independent  Outcome: Not Progressing Patient had incontinent stool x 1 today and continent x1.

## 2011-06-09 NOTE — Progress Notes (Signed)
Patient received 0.25 xanax for anxiety at 1150. Patient verbalized some relief. Patient bladder scan volumes have been low and patient did not need to be cathed. Patient still only using bed pan at this time due to extreme fatigue. Patient sat in recliner for about an hour today. Patient tolerated well. Patient transferred back to bed with moderate assistance. Patient oxygen saturation has remained in high 80s to low 90s. Patient had 1 incontinent and 1 continent bowel movement today.

## 2011-06-09 NOTE — Progress Notes (Signed)
Occupational Therapy Session Note  Patient Details  Name: [name] [name] MRN: [id number] Date of Birth: October 11, 1940  Today's Date: 06/09/2011 Time: 1030-1150 Time Calculation (min): 80 min  Skilled Therapeutic Interventions/Progress Updates:  Scheduled for ADL but patient only able to tolerate limited participation due to complaints of extreme fatigue and endurance.  Patient given extra time and with much, much encouragement was able to transfer EOB to recliner chair with Min A (Contact guard assistance).  Patient sat in recliner for at least one hour today.  Husband present and supportive for the bed to recliner transfer.       Therapy Documentation   Pain: no c/os   See FIM for current functional status  Therapy/Group: Individual Therapy  Bud Face [facility] 06/09/2011, 4:25 PM

## 2011-06-10 LAB — BASIC METABOLIC PANEL
BUN: 13 mg/dL (ref 6–23)
Chloride: 95 mEq/L — ABNORMAL LOW (ref 96–112)
GFR calc Af Amer: >90 mL/min (ref >90)
Potassium: 3.7 mEq/L (ref 3.5–5.1)
Sodium: 139 mEq/L (ref 135–145)

## 2011-06-10 LAB — CBC
HCT: 33.5 % — ABNORMAL LOW (ref 36.0–46.0)
Platelets: 164 10*3/uL (ref 150–400)
RDW: 14.5 % (ref 11.5–15.5)
WBC: 8 10*3/uL (ref 4.0–10.5)

## 2011-06-10 MED ORDER — FUROSEMIDE 40 MG PO TABS
40.0000 mg | ORAL_TABLET | Freq: Once | ORAL | Status: AC
  Administered 2011-06-10: 40 mg via ORAL
  Filled 2011-06-10: qty 1

## 2011-06-10 MED ORDER — IPRATROPIUM BROMIDE 0.02 % IN SOLN
0.5000 mg | Freq: Four times a day (QID) | RESPIRATORY_TRACT | Status: DC
  Administered 2011-06-10 – 2011-06-19 (×36): 0.5 mg via RESPIRATORY_TRACT
  Filled 2011-06-10 (×38): qty 2.5

## 2011-06-10 MED ORDER — PREDNISONE 20 MG PO TABS
20.0000 mg | ORAL_TABLET | Freq: Every day | ORAL | Status: DC
  Administered 2011-06-11 – 2011-06-19 (×9): 20 mg via ORAL
  Filled 2011-06-10 (×10): qty 1

## 2011-06-10 NOTE — Progress Notes (Signed)
Occupational Therapy Session Note  Patient Details  Name: [name] [name] MRN: [id number] Date of Birth: 22-Apr-1940  Today's Date: 06/10/2011 Time: 1335-1430 Time Calculation (min): 55 min  Short Term Goals: Week 1:  OT Short Term Goal 1 (Week 1): Patient will maintain dynamic standing balance with min/steady assist using AD prn to increase independence with LB dressing OT Short Term Goal 2 (Week 1): Patient will complete toilet transfer with min assist using DME prn OT Short Term Goal 3 (Week 1): Tub/shower transfer using DME prn will be attempted with patient OT Short Term Goal 4 (Week 1): Patient will engage in grooming tasks at sink with set-up assist  Skilled Therapeutic Interventions/Progress Updates:  Skilled occupational therapy focusing on recliner -> BSC transfer, toileting (clothing management & perineal hygiene), BSC -> bed transfer, bed mobility, and overall activity tolerance/endurance. Patients 02 levels fluctuated throughout session with nasal cannula donned; as low as 74 and as high as 94. When below 90% encouraged pursed lip breathing and rest breaks. Patient with much more than reasonable amount of time to complete tasks secondary to deconditioned status. Positioned patient in bed comfortably and left with call bell and phone within reach.   Precautions:  Precautions Precautions: Fall Required Braces or Orthoses DO NOT USE: No Restrictions Weight Bearing Restrictions: No  See FIM for current functional status  Therapy/Group: Individual Therapy  Tameron Lama 06/10/2011, 3:25 PM

## 2011-06-10 NOTE — Progress Notes (Addendum)
Patient ID: [name] [name], female   DOB: 07-21-1940, 71 y.o.   MRN: [id number] Patient ID: [name] [name], female   DOB: 1941/01/13, 71 y.o.   MRN: [id number] Subjective/Complaints: Review of Systems  Respiratory: Positive for cough and shortness of breath.   Psychiatric/Behavioral: The patient is nervous/anxious and has insomnia.   All other systems reviewed and are negative.  having more difficulty breathing over the weekend in general.   Objective: Vital Signs: Blood pressure 131/71, pulse 73, temperature 97.3 F (36.3 C), temperature source Oral, resp. rate 20, height 5\' 5"  (1.651 m), weight 75 kg (165 lb 5.5 oz), SpO2 91.00%. Dg Chest 2 View  06/09/2011  *RADIOLOGY REPORT*  Clinical Data: Decreased saturations. Short of breath  CHEST - 2 VIEW  Comparison: Chest radiograph 06/07/2011  Findings: Normal cardiac silhouette.  There are bilateral pleural effusions and basilar atelectasis similar to prior.  Lungs are hyperinflated.  PICC line terminates in the axillary vein.  Exam is lordotic.  IMPRESSION:  1.  No significant change. 2.  Bilateral pleural effusions and basilar atelectasis.  Original Report Authenticated By: Genevive Bi, M.D.   No results found for this basename: WBC:2,HGB:2,HCT:2,PLT:2 in the last 72 hours No results found for this basename: NA:2,K:2,CL:2,CO2:2,GLUCOSE:2,BUN:2,CREATININE:2,CALCIUM:2 in the last 72 hours CBG (last 3)  No results found for this basename: GLUCAP:3 in the last 72 hours  Wt Readings from Last 3 Encounters:  06/06/11 75 kg (165 lb 5.5 oz)  06/03/11 71 kg (156 lb 8.4 oz)  04/01/11 72.576 kg (160 lb)    Physical Exam:  General appearance: alert, cooperative and mild distress Head: Normocephalic, without obvious abnormality, atraumatic Eyes:  Ears:  Nose: Nares normal. Septum midline. Mucosa normal. No drainage or sinus tenderness. Throat: lips, mucosa, and tongue normal; teeth and gums normal Neck: no adenopathy, no carotid bruit, no JVD, supple,  symmetrical, trachea midline and thyroid not enlarged, symmetric, no tenderness/mass/nodules Back: symmetric, no curvature. ROM normal. No CVA tenderness. Resp: diminished breath sounds bilaterally sats in the 88-92 range, dyspneic while at rest. Crackles at bases Cardio: regular rate and rhythm, S1, S2 normal, no murmur, click, rub or gallop GI: soft, non-tender; bowel sounds normal; no masses,  no organomegaly Extremities: extremities normal, atraumatic, no cyanosis or edema Pulses: 2+ and symmetric Skin: Skin color, texture, turgor normal. No rashes or lesions Neurologic: Grossly normal generalized weakness proximal lower est predominantly.  No gross sensory findings. Cognitive deficits Incision/Wound:  Psych: remains anxious.    Assessment/Plan: 1. Functional deficits secondary to severe deconditioning related to respiratory failure which require 3+ hours per day of interdisciplinary therapy in a comprehensive inpatient rehab setting. Physiatrist is providing close team supervision and 24 hour management of active medical problems listed below. Physiatrist and rehab team continue to assess barriers to discharge/monitor patient progress toward functional and medical goals. FIM: FIM - Bathing Bathing Steps Patient Completed: Chest;Left Arm;Right Arm Bathing: 2: Max-Patient completes 3-4 75f 10 parts or 25-49% (patient very fatigued and SOB; requried xtra time)  FIM - Upper Body Dressing/Undressing Upper body dressing/undressing steps patient completed: Thread/unthread right sleeve of pullover shirt/dresss;Thread/unthread left sleeve of pullover shirt/dress Upper body dressing/undressing: 2: Max-Patient completed 25-49% of tasks FIM - Lower Body Dressing/Undressing Lower body dressing/undressing steps patient completed: Thread/unthread right pants leg;Thread/unthread left pants leg;Pull pants up/down;Don/Doff right sock;Don/Doff left sock Lower body dressing/undressing: 1: Total-Patient  completed less than 25% of tasks (wore her personal gown/dress and tredded socks only)  FIM - Toileting Toileting steps completed by patient:  Adjust clothing prior to toileting;Performs perineal hygiene;Adjust clothing after toileting Toileting: 0: Activity did not occur  FIM - Archivist Transfers: 0-Activity did not occur  FIM - Banker Devices: Bed rails;Arm rests Bed/Chair Transfer: 3: Supine > Sit: Mod A (lifting assist/Pt. 50-74%/lift 2 legs;4: Bed > Chair or W/C: Min A (steadying Pt. > 75%)  FIM - Locomotion: Wheelchair Locomotion: Wheelchair: 1: Total Assistance/staff pushes wheelchair (Pt<25%) FIM - Locomotion: Ambulation Locomotion: Ambulation Assistive Devices: Designer, industrial/product Ambulation/Gait Assistance: 4: Min assist Locomotion: Ambulation: 0: Activity did not occur  Comprehension Comprehension Mode: Auditory Comprehension: 7-Follows complex conversation/direction: With no assist  Expression Expression Mode: Verbal Expression Assistive Devices: 6-Communication board Expression: 7-Expresses complex ideas: With no assist  Social Interaction Social Interaction: 2-Interacts appropriately 25 - 49% of time - Needs frequent redirection.  Problem Solving Problem Solving: 6-Solves complex problems: With extra time  Memory Memory: 7-Complete Independence: No helper  1. DVT Prophylaxis/Anticoagulation: Mechanical: Sequential compression devices, below knee Bilateral lower extremities  2. Pain Management: will monitor for back/ knee pain with increased activity.  3. Mood: still anxious at times.  Gets anxious easily over breathing.  Needs positive reinforcement. Prn xanax 4. COPD: acute on chronic effects. On prednisone at home restart at 10mg   -f/u cxr just performed 4/14-no significant change  -having more difficulty breathing this am/yesterday  -lasix and increased prednisone  -added atrovent neb  -ask pulmonary to  follow up 5. PSVT: Resolved on amiodarone bid, digoxin and cardizem.  6. Frequency: ua negative, culture neg  -occasional retention however urecholine may be causing increased resp distress  -continue voiding trial and prn caths  -she does have a hx of bladder CA 7. Constipation: started bowel program. Scheduled senna at bedtime  LOS (Days) 6 A FACE TO FACE EVALUATION WAS PERFORMED  Nassir Neidert T 06/10/2011, 8:13 AM

## 2011-06-10 NOTE — Plan of Care (Signed)
Reports of incontinent of stool at times. No report of incontinent today. Attempted to discuss bowel program and interventions with pt. Pt extremely anxious regarding other medical concerns i.e, fatigue, sob, etc. In addition, pt feels that loose stools are a result of two suppository that she recently received, and inability to received toileting assist in a timely manner. Additional education needed, before pt is agreeable to bowel program implementation

## 2011-06-10 NOTE — Progress Notes (Signed)
Patient ID: JANIAH DEVINNEY MRN: [id number] DOB/AGE: [birth year] 71 y.o.   Lines, Tubes, etc:  ETT 3/20>>>3/22  ETT 3/24>>>3/27  Left IJ CVL 3/20>>> 3/29  Foley cath 4/3>>> 4/6  LUE PICC 4/5 >>   Microbiology:  Sputum 3/20>>>NF  BCx2 3/20>>>neg  UC 3/20>>>neg  MRSA PCR>>>neg   Antibiotics:  Rocephin 3/20>>>3/28  azithro 3/20>>>3/25  avelox 4/11 (PNA)>>>  Studies/Events:  Echo 3/20>>> EF 65-70% LVH  Consults: cardiology  Subjective Still very SOB with exertion. Cough weak, wheezing a little more Exam: BP 131/71  Pulse 105  Temp(Src) 97.3 F (36.3 C) (Oral)  Resp 20  Ht 5\' 5"  (1.651 m)  Wt 75 kg (165 lb 5.5 oz)  BMI 27.51 kg/m2  SpO2 90% 5 liters N/C  General - Chronically ill appearing, NAD.  Lungs - wheezing, decreased in left Cardiac - RRR, no gallop.  Extremities - No pitting.  abd- non-tender GU: voids Neuro: intact but has generalized weakness.   PCXR: slight improvement bibasilar aeration  Lab 06/10/11 0934 06/07/11 0725 06/05/11 0710  NA 139 137 139  K 3.7 3.8 3.4*  CL 95* 96 98  CO2 35* 32 35*  BUN 13 18 18   CREATININE 0.43* 0.43* 0.51  GLUCOSE 89 94 91    Lab 06/10/11 0934 06/05/11 0710  HGB 10.8* 11.4*  HCT 33.5* 34.5*  WBC 8.0 13.0*  PLT 164 133*    Acute exacerbation of chronic respiratory failure in the setting COPD exacerbation:  Still has significant oxygen requirements and left sided atelectasis/mucous plugging and volume loss. Slightly better CXR 4/12 and slight improvement on 14th . Dyspnea Largely due to her debilitation, weakness, atx.  Recommendation: - Continue supplemental O2 - titrate carefully to maintain SpO2 88-94%  - Cont nebulized Levalbuterol (due to PSVT) and also continue budesonide w/EZPAP.  Eventually can go back to symbicort.  -aggressive pulmonary hygiene: continue flutter valve and IS -will slowly wean prednisone -avelox ordered 4/10 -mucinex.  -agree w/ lasix.   Sandrea Hughs, MD Pulmonary and Critical Care  [facility] Cell [phone]

## 2011-06-10 NOTE — Progress Notes (Signed)
Physical Therapy Note  Patient Details  Name: [name] [name] MRN: [id number] Date of Birth: 02-28-1940 Today's Date: 06/10/2011  8:45- 9:40 individual therapy pt complained of dyspnea and anxiety. Requested anxiety medication.  Most of session spent encouraging pt. And developing a plan to aid pt progressing with therapy without her feeling like she was being pushed too hard. Pt agreed to an out of bed schedule based on when she could get anxiety medication, and accomplishing atleast 1 difficult goal per session. This was discussed with the OT for the day and will be discussed with primary OT. Bed mobility supervision with rail and HOB to 30 degrees. Pt sat EOB 2 minutes to catch breath.  Pt requested to perform stand pivot without rw with max assist. O2 monitor was tuned off during transfer to decrease beeping which could lead to increased anxiety, by the time O2 read it was 93% on 5 LO2.   Julian Reil 06/10/2011, 10:10 AM

## 2011-06-10 NOTE — Progress Notes (Signed)
Occupational Therapy Session Note  Patient Details  Name: NADIRA SINGLE MRN: [id number] Date of Birth: 11-10-1940  Today's Date: 06/10/2011 Time: 1001-1101 Time Calculation (min): 60 min  Short Term Goals: Week 1:  OT Short Term Goal 1 (Week 1): Patient will maintain dynamic standing balance with min/steady assist using AD prn to increase independence with LB dressing OT Short Term Goal 2 (Week 1): Patient will complete toilet transfer with min assist using DME prn OT Short Term Goal 3 (Week 1): Tub/shower transfer using DME prn will be attempted with patient OT Short Term Goal 4 (Week 1): Patient will engage in grooming tasks at sink with set-up assist  Skilled Therapeutic Interventions/Progress Updates:    Worked on toileting and toilet transfer to bedside commode positioned beside the bedside chair.  Pt with increased anxiety during session with attempted movement.  Needs increased time for rest break prior to attempting transfers or sit to stand.  Dyspnea 2/4 at rest and 3/4 with minimal activity.  Attempted sit to stand for 4 intervals to assist with toilet hygiene.  Each attempt pt only able to tolerate approximately 15-20 seconds of standing time before needing a rest break.  Pt unable to attempt her own hygiene or clothing management secondary to not being able to stand and let go with her UEs.  Continues to have difficulty tolerating very much activity with OT at this time.    Therapy Documentation Precautions:  Precautions Precautions: Fall Required Braces or Orthoses DO NOT USE: No Restrictions Weight Bearing Restrictions: No  Vital Signs: Therapy Vitals Pulse Rate: 105  (After transfer to bedside commode.) Patient Position, if appropriate: Sitting Oxygen Therapy SpO2: 90 % O2 Device: Nasal cannula O2 Flow Rate (L/min): 4 L/min Pulse Oximetry Type: Intermittent Pain: Pain Assessment Pain Assessment: No/denies pain Pain Score:   2 ADL: See FIM for current functional  status  Therapy/Group: Individual Therapy  Lorenna Lurry 06/10/2011, 12:06 PM

## 2011-06-11 MED ORDER — ALPRAZOLAM 0.25 MG PO TABS
0.2500 mg | ORAL_TABLET | Freq: Three times a day (TID) | ORAL | Status: DC
  Administered 2011-06-11 – 2011-06-19 (×19): 0.25 mg via ORAL
  Filled 2011-06-11 (×21): qty 1

## 2011-06-11 MED ORDER — FUROSEMIDE 20 MG PO TABS
20.0000 mg | ORAL_TABLET | Freq: Every day | ORAL | Status: DC
  Administered 2011-06-11 – 2011-06-19 (×9): 20 mg via ORAL
  Filled 2011-06-11 (×10): qty 1

## 2011-06-11 NOTE — Progress Notes (Signed)
Physical Therapy Note  Patient Details  Name: [name] [name] MRN: [id number] Date of Birth: 1941-02-05 Today's Date: 06/11/2011  13:00-14:53 individual therapy pt denied pain.  Preformed recliner to [facility] transfer mod assist with vc for walker placement and hand placement for safety x 2. Pt was able to perform hygiene with assistance. Therapist managed raising and lowering gown. Gait x 5' with rw and husband following with chair on 4 LO2. O2 decreased to 86% pt rested in tripod position for 5 minutes then performed stand pivot with mod assist without rw. Pt continues to be very anxious about mobility. Goals were decreased and they were discussed with pt. She agreed on distance of 25' for gait and wc.   Julian Reil 06/11/2011, 2:05 PM

## 2011-06-11 NOTE — Progress Notes (Signed)
Occupational Therapy Note  Patient Details  Name: EDANA AGUADO MRN: [id number] Date of Birth: Oct 16, 1940 Today's Date: 06/11/2011  1st Session Time: 0930-1030 Pt denies pain Individual Therapy Pt requrested bathing while seated EOB and donning night gown instead of clothing.  Pt supervision for supine to sit but min/mod A for sit to stand with steady A for transfer to recliner at completion of bathing/dressing tasks.  Pt O2 sats ranged between 80% and 99% on 4L O2 with periodic activity.  Pt required multiple rest breaks throughout therapy session.  Participation minimal throughout session secondary to decreased activity tolerance.  2nd Session Time: 1130-1200 Pt denies pain Pt engaged in sit to stand and standing activities with multiple rest breaks.  O2 sats >90% on 4L O2.  Pt continues to exhibit increased anxiety with anticipation of activity and during activity.  Pt requires min verbal cues for pursed lip breathing.  Min A for sit to stand with min verbal cues for hand placement.  Pt exhibits decreased control with stand to sit.  Focus on activity tolerance and safety awareness   Rich Brave 06/11/2011, 12:07 PM

## 2011-06-11 NOTE — Progress Notes (Signed)
Subjective/Complaints: Review of Systems  Respiratory: Positive for cough and shortness of breath.   Psychiatric/Behavioral: The patient is nervous/anxious and has insomnia.   All other systems reviewed and are negative.  feels a little better today. Breathing more comfortably. Still fixates on pulse oximeter.   Objective: Vital Signs: Blood pressure 148/68, pulse 71, temperature 98.7 F (37.1 C), temperature source Oral, resp. rate 20, height 5\' 5"  (1.651 m), weight 75 kg (165 lb 5.5 oz), SpO2 96.00%. Dg Chest 2 View  06/09/2011  *RADIOLOGY REPORT*  Clinical Data: Decreased saturations. Short of breath  CHEST - 2 VIEW  Comparison: Chest radiograph 06/07/2011  Findings: Normal cardiac silhouette.  There are bilateral pleural effusions and basilar atelectasis similar to prior.  Lungs are hyperinflated.  PICC line terminates in the axillary vein.  Exam is lordotic.  IMPRESSION:  1.  No significant change. 2.  Bilateral pleural effusions and basilar atelectasis.  Original Report Authenticated By: Genevive Bi, M.D.    Basename 06/10/11 0934  WBC 8.0  HGB 10.8*  HCT 33.5*  PLT 164    Basename 06/10/11 0934  NA 139  K 3.7  CL 95*  CO2 35*  GLUCOSE 89  BUN 13  CREATININE 0.43*  CALCIUM 9.2   CBG (last 3)  No results found for this basename: GLUCAP:3 in the last 72 hours  Wt Readings from Last 3 Encounters:  06/06/11 75 kg (165 lb 5.5 oz)  06/03/11 71 kg (156 lb 8.4 oz)  04/01/11 72.576 kg (160 lb)    Physical Exam:  General appearance: alert, cooperative and mild distress Head: Normocephalic, without obvious abnormality, atraumatic Eyes:  Ears:  Nose: Nares normal. Septum midline. Mucosa normal. No drainage or sinus tenderness. Throat: lips, mucosa, and tongue normal; teeth and gums normal Neck: no adenopathy, no carotid bruit, no JVD, supple, symmetrical, trachea midline and thyroid not enlarged, symmetric, no tenderness/mass/nodules Back: symmetric, no curvature. ROM  normal. No CVA tenderness. Resp: diminished breath sounds bilaterally crackles at bases. Better air movement. Exp wheezing. sats in high 90's on 4L Cardio: regular rate and rhythm, S1, S2 normal, no murmur, click, rub or gallop GI: soft, non-tender; bowel sounds normal; no masses,  no organomegaly Extremities: extremities normal, atraumatic, no cyanosis or edema Pulses: 2+ and symmetric Skin: Skin color, texture, turgor normal. No rashes or lesions Neurologic: Grossly normal generalized weakness proximal lower est predominantly.  No gross sensory findings. Cognitive deficits Incision/Wound:  Psych: remains very anxious   Assessment/Plan: 1. Functional deficits secondary to severe deconditioning related to respiratory failure which require 3+ hours per day of interdisciplinary therapy in a comprehensive inpatient rehab setting. Physiatrist is providing close team supervision and 24 hour management of active medical problems listed below. Physiatrist and rehab team continue to assess barriers to discharge/monitor patient progress toward functional and medical goals. FIM: FIM - Bathing Bathing Steps Patient Completed: Chest;Left Arm;Right Arm Bathing: 2: Max-Patient completes 3-4 67f 10 parts or 25-49% (patient very fatigued and SOB; requried xtra time)  FIM - Upper Body Dressing/Undressing Upper body dressing/undressing steps patient completed: Thread/unthread right sleeve of pullover shirt/dresss;Thread/unthread left sleeve of pullover shirt/dress Upper body dressing/undressing: 2: Max-Patient completed 25-49% of tasks FIM - Lower Body Dressing/Undressing Lower body dressing/undressing steps patient completed: Thread/unthread right pants leg;Thread/unthread left pants leg;Pull pants up/down;Don/Doff right sock;Don/Doff left sock Lower body dressing/undressing: 1: Total-Patient completed less than 25% of tasks (wore her personal gown/dress and tredded socks only)  FIM - Toileting Toileting  steps completed by patient: Adjust clothing  prior to toileting;Performs perineal hygiene;Adjust clothing after toileting Toileting: 1: Total-Patient completed zero steps, helper did all 3  FIM - Diplomatic Services operational officer Devices: Bedside commode Toilet Transfers: 3-To toilet/BSC: Mod A (lift or lower assist);3-From toilet/BSC: Mod A (lift or lower assist)  FIM - Bed/Chair Transfer Bed/Chair Transfer Assistive Devices: Bed rails;Arm rests Bed/Chair Transfer: 3: Supine > Sit: Mod A (lifting assist/Pt. 50-74%/lift 2 legs;4: Bed > Chair or W/C: Min A (steadying Pt. > 75%)  FIM - Locomotion: Wheelchair Locomotion: Wheelchair: 1: Total Assistance/staff pushes wheelchair (Pt<25%) FIM - Locomotion: Ambulation Locomotion: Ambulation Assistive Devices: Designer, industrial/product Ambulation/Gait Assistance: 4: Min assist Locomotion: Ambulation: 0: Activity did not occur  Comprehension Comprehension Mode: Auditory Comprehension: 7-Follows complex conversation/direction: With no assist  Expression Expression Mode: Verbal Expression Assistive Devices: 6-Communication board Expression: 7-Expresses complex ideas: With no assist  Social Interaction Social Interaction: 6-Interacts appropriately with others with medication or extra time (anti-anxiety, antidepressant).  Problem Solving Problem Solving: 6-Solves complex problems: With extra time  Memory Memory: 7-Complete Independence: No helper  1. DVT Prophylaxis/Anticoagulation: Mechanical: Sequential compression devices, below knee Bilateral lower extremities  2. Pain Management: will monitor for back/ knee pain with increased activity.  3. Mood: still anxious at times.  Gets anxious easily over breathing.  Needs positive reinforcement. Prn xanax 4. COPD: acute on chronic effects. On prednisone at home restart at 10mg   -f/u cxr just performed 4/14-no significant change  -breathing better today  -lasix and increased  prednisone  -added atrovent neb, ezpap  -appreciate pulmonary help  -discussed with patient and husband. Agreed to dc pulse oximeter in room. 5. PSVT: Resolved on amiodarone bid, digoxin and cardizem.  6. Frequency: ua negative, culture neg  -occasional retention however urecholine may be causing increased resp distress  -continue voiding trial and prn caths  -she does have a hx of bladder CA 7. Constipation: started bowel program. Scheduled senna at bedtime  LOS (Days) 7 A FACE TO FACE EVALUATION WAS PERFORMED  Soliyana Mcchristian T 06/11/2011, 8:33 AM

## 2011-06-12 DIAGNOSIS — Z5189 Encounter for other specified aftercare: Secondary | ICD-10-CM

## 2011-06-12 DIAGNOSIS — J9600 Acute respiratory failure, unspecified whether with hypoxia or hypercapnia: Secondary | ICD-10-CM

## 2011-06-12 DIAGNOSIS — J449 Chronic obstructive pulmonary disease, unspecified: Secondary | ICD-10-CM

## 2011-06-12 DIAGNOSIS — R5381 Other malaise: Secondary | ICD-10-CM

## 2011-06-12 DIAGNOSIS — F341 Dysthymic disorder: Secondary | ICD-10-CM

## 2011-06-12 MED ORDER — ALPRAZOLAM 0.25 MG PO TABS
0.2500 mg | ORAL_TABLET | Freq: Two times a day (BID) | ORAL | Status: DC | PRN
  Administered 2011-06-12 – 2011-06-19 (×5): 0.25 mg via ORAL
  Filled 2011-06-12 (×7): qty 1

## 2011-06-12 NOTE — Progress Notes (Signed)
Patient ID: [name] MRN: [id number] DOB/AGE: 1940-11-28 71 y.o.   Lines, Tubes, etc:  ETT 3/20>>>3/22  ETT 3/24>>>3/27  Left IJ CVL 3/20>>> 3/29  Foley cath 4/3>>> 4/6  LUE PICC 4/5 >>   Microbiology:  Sputum 3/20>>> neg  BCx2 3/20>>>neg  UC 3/20>>>neg  MRSA PCR>>>neg   Antibiotics:  Rocephin 3/20>>>3/28  azithro 3/20>>>3/25  avelox 4/11 (PNA)>>>  Studies/Events:  Echo 3/20>>> EF 65-70% LVH  Consults: cardiology  Subjective Still very SOB with exertion. Cough less congested  Exam: BP 107/62  Pulse 95  Temp(Src) 98.4 F (36.9 C) (Oral)  Resp 18  Ht 5\' 5"  (1.651 m)  Wt 165 lb 5.5 oz (75 kg)  BMI 27.51 kg/m2  SpO2 74% 4  liters N/C  General - Chronically ill appearing, NAD.  Lungs - wheezing, decreased in left Cardiac - RRR, no gallop.  Extremities - No pitting.  abd- non-tender GU: voids Neuro: intact but has generalized weakness.   PCXR: slight improvement bibasilar aeration  Lab 06/10/11 0934 06/07/11 0725  NA 139 137  K 3.7 3.8  CL 95* 96  CO2 35* 32  BUN 13 18  CREATININE 0.43* 0.43*  GLUCOSE 89 94    Lab 06/10/11 0934  HGB 10.8*  HCT 33.5*  WBC 8.0  PLT 164    Acute exacerbation of chronic respiratory failure in the setting COPD exacerbation:  Still has significant oxygen requirements and left sided atelectasis/mucous plugging and volume loss. Slightly better CXR 4/12 and slight improvement on 14th . Dyspnea Largely due to her debilitation, weakness, atx.  Recommendation: - Continue supplemental O2 - titrate carefully to maintain SpO2 88-94%  - Cont nebulized Levalbuterol (due to PSVT) and also continue budesonide w/EZPAP.  Eventually can go back to symbicort.  -aggressive pulmonary hygiene: continue flutter valve and IS - slowly wean prednisone -avelox d/c 4/18 (7 days rx) -mucinex/ flutter/ mobilize    [name] Hughs, MD Pulmonary and Critical Care Medicine [facility] Cell [phone]

## 2011-06-12 NOTE — Care Management Note (Addendum)
Per State Regulation 482.30 This chart was reviewed for medical necessity with respect to the patient's Admission/Duration of stay. Tuesday afternoon gave pt & husband team conf report:  ELOS 06/19/11 Supervision-min assist goals.  Both agreeable to this plan.  Pt noted w/ less labored breathing.  Today update faxed & called to Bon[facility] at [facility].  Trey Paula agrees w/ d/c date 06/19/11.   Brock Ra                 Nurse Care Manager              Next Review Date: 06/18/11

## 2011-06-12 NOTE — Progress Notes (Signed)
Physical Therapy Note  Patient Details  Name: TWALA COLLINGS MRN: [id number] Date of Birth: [birth year] Today's Date: 06/12/2011  10:00-11:00 individual therapy pt denied pain  Sit to stand minguard,/ supervision x 4 throughout session. Gait performed x 10', 2 x5' with rw min assist on 4LO2. Pt desated to 74% as session progressed.and resting O2 was 88%.   Julian Reil 06/12/2011, 11:59 AM

## 2011-06-12 NOTE — Progress Notes (Signed)
Social Work Patient ID: [name] [name], female   DOB: [birth year], 71 y.o.   MRN: [id number]    Met with patient this afternoon for general support visit.  Discussed targeted d/c for 4/24, however, pt states,  "I'm ready to go now".  Pt's affect and appearance a little brighter but, when this was noted, pt states,"... But I've had a bad day". Attempted to provide encouragement and positive feedback.  Pt not very receptive.  Will continue to follow and assist with d/c planning.  AmeLie Hollars

## 2011-06-12 NOTE — Progress Notes (Signed)
Physical Therapy Weekly Progress Note  Patient Details  Name: [name] [name] MRN: [id number] Date of Birth: 1940/12/19  Today's Date: 06/13/2011 Time: 8250-5397 Time Calculation (min): 75 min  Patient has met 2 of 3 short term goals.  LTG were downgraded to min assist 25' due to anxiety and poor activity tolerance.  Patient continues to demonstrate the following deficits: Decreased SpO2 with mobility, decreased strength, impaired endurance and therefore will continue to benefit from skilled PT intervention to enhance overall performance with activity tolerance.  Patient progressing toward long term goals..  Continue plan of care.  PT Short Term Goals Week 1:  PT Short Term Goal 1 (Week 1): Pt will be modified independent with bed mobility, SpO2 >/= 90% upon sitting EOB. PT Short Term Goal 1 - Progress (Week 1):  met PT Short Term Goal 2 (Week 1): Pt will ambulate 50 feet with min assist, SpO2>/= 88% not met PT Short Term Goal 3 (Week 1): Pt will perform 3 sit to/from stands during session with supervision, SpO2 >/= 88% PT Short Term Goal 3 - Progress (Week 1):met except for O2      Therapy Documentation Precautions:  Precautions Precautions: Fall Precaution Comments: oxygen Required Braces or Orthoses DO NOT USE: No Restrictions Weight Bearing Restrictions: No    Pain: Pain Assessment Pain Assessment: No/denies pain Pain Score: 0-No pain       See session note for mobility status  See FIM for current functional status  Therapy/Group: Individual Therapy  [name] [name] 06/13/2011, 6:48 PM

## 2011-06-12 NOTE — Progress Notes (Signed)
Occupational Therapy Session Notes And Weekly Progress Note  Patient Details  Name: [name] [name] MRN: [id number] Date of Birth: June 07, 1940  Today's Date: 06/12/2011  1st Session Time: 0800-0840 Time Calculation (min): 40 min  Pain Assessment: No/denies pain Individual therapy Pt missed 20 minutes therapy Pt in bed receiving breathing treatment and then wanted to eat breakfast before starting therapy.  Pt engaged in functional amb in room including bed transfer and mobility.  Pt exhibited increased anxiety this morning and stated she had not received her Xanax yet.  Pt required extra time to complete all tasks.  Pt declined bathing this morning but did change night gown.  Pt completed sit to stand X 4 and amb to recliner with r/w.  Focus on activity tolerance and energy conservation strategies.  2nd Session Time: 1130-1200 Pt denies pain Individual Therapy  Pt amb to BSC (~5 feet) with r/w with steady assist.  Pt exhibited increased anxiety with ambulation.  Pt stood to perform perineal hygiene with steady assist.  Pt amb to bed (~10 feet) with r/w and performed lateral scoots to sit closer to [facility] before laying down.  Pt required extra time to complete all tasks with rest breaks.  O2 sats ranged between 81% after and activity and 95% at rest on 3.5L O2.  Weekly Summary. Patient has met 3 of 4 short term goals.  Pt is progressing slowly but steadily since admission.  Pt continues to exhibit decreased activity tolerance and increased anxiety during all activities.  Pt requires multiple rest breaks during all therapy sessions and max encouragement to participate.  Pt is mod A for bathing, min A for UB dressing, and is currently only wearing night gown.  Pt transfers to [facility] with min a and is max A for toileting.  Pt requires steady assist when standing but is only able to stand for approx 20-30 seconds before sitting down.  Tub/Shower transfer STG not met secondary to patients overall decreased  endurance. Will continue to work towards tub/shower transfer goal.   Patient continues to demonstrate the following deficits: decreased activity tolerance/endurance, decreased independence with basic self-care tasks, decreased independence with transfers & functional mobility, and increased anxiety. Therefore, patient will continue to benefit from skilled OT intervention to enhance overall performance with BADL and iADL.  Patient progressing toward long term goals..  Continue plan of care.  OT Short Term Goals Week 1:  OT Short Term Goal 1 (Week 1): Patient will maintain dynamic standing balance with min/steady assist using AD prn to increase independence with LB dressing OT Short Term Goal 1 - Progress (Week 1): Met OT Short Term Goal 2 (Week 1): Patient will complete toilet transfer with min assist using DME prn OT Short Term Goal 2 - Progress (Week 1): Met OT Short Term Goal 3 (Week 1): Tub/shower transfer using DME prn will be attempted with patient OT Short Term Goal 3 - Progress (Week 1): Progressing toward goal OT Short Term Goal 4 (Week 1): Patient will engage in grooming tasks at sink with set-up assist OT Short Term Goal 4 - Progress (Week 1): Met  Precautions:  Precautions Precautions: Fall Required Braces or Orthoses DO NOT USE: No Restrictions Weight Bearing Restrictions: No   Rich Brave 06/12/2011, 8:46 AM   Co-Signed by Edwin Cap, MS, OTR/L

## 2011-06-12 NOTE — Progress Notes (Addendum)
Patient ID: [name] [name], female   DOB: 01-18-1941, 71 y.o.   MRN: [id number] Subjective/Complaints: Review of Systems  Respiratory: Positive for cough and shortness of breath.   Psychiatric/Behavioral: The patient is nervous/anxious and has insomnia.   All other systems reviewed and are negative.  had a better day. Fixates on oxygen levels and xanax dosing!   Objective: Vital Signs: Blood pressure 107/62, pulse 73, temperature 98.4 F (36.9 C), temperature source Oral, resp. rate 18, height 5\' 5"  (1.651 m), weight 75 kg (165 lb 5.5 oz), SpO2 93.00%. No results found.  Basename 06/10/11 0934  WBC 8.0  HGB 10.8*  HCT 33.5*  PLT 164    Basename 06/10/11 0934  NA 139  K 3.7  CL 95*  CO2 35*  GLUCOSE 89  BUN 13  CREATININE 0.43*  CALCIUM 9.2   CBG (last 3)  No results found for this basename: GLUCAP:3 in the last 72 hours  Wt Readings from Last 3 Encounters:  06/06/11 75 kg (165 lb 5.5 oz)  06/03/11 71 kg (156 lb 8.4 oz)  04/01/11 72.576 kg (160 lb)    Physical Exam:  General appearance: alert, cooperative and mild distress Head: Normocephalic, without obvious abnormality, atraumatic Eyes:  Ears:  Nose: Nares normal. Septum midline. Mucosa normal. No drainage or sinus tenderness. Throat: lips, mucosa, and tongue normal; teeth and gums normal Neck: no adenopathy, no carotid bruit, no JVD, supple, symmetrical, trachea midline and thyroid not enlarged, symmetric, no tenderness/mass/nodules Back: symmetric, no curvature. ROM normal. No CVA tenderness. Resp: diminished breath sounds bilaterally crackles at bases. Better air movement. Exp wheezing. More comfortable. On 4L currently.  Cardio: regular rate and rhythm, S1, S2 normal, no murmur, click, rub or gallop GI: soft, non-tender; bowel sounds normal; no masses,  no organomegaly Extremities: extremities normal, atraumatic, no cyanosis or edema Pulses: 2+ and symmetric Skin: Skin color, texture, turgor normal. No rashes or  lesions Neurologic: Grossly normal generalized weakness proximal lower est predominantly.  No gross sensory findings. Cognitive deficits Incision/Wound:  Psych: remains very anxious   Assessment/Plan: 1. Functional deficits secondary to severe deconditioning related to respiratory failure which require 3+ hours per day of interdisciplinary therapy in a comprehensive inpatient rehab setting. Physiatrist is providing close team supervision and 24 hour management of active medical problems listed below. Physiatrist and rehab team continue to assess barriers to discharge/monitor patient progress toward functional and medical goals. FIM: FIM - Bathing Bathing Steps Patient Completed: Chest;Right Arm;Left Arm;Abdomen;Front perineal area;Right upper leg;Left upper leg Bathing: 3: Mod-Patient completes 5-7 47f 10 parts or 50-74%  FIM - Upper Body Dressing/Undressing Upper body dressing/undressing steps patient completed: Thread/unthread right sleeve of pullover shirt/dresss;Thread/unthread left sleeve of pullover shirt/dress;Pull shirt over trunk Upper body dressing/undressing: 4: Min-Patient completed 75 plus % of tasks FIM - Lower Body Dressing/Undressing Lower body dressing/undressing steps patient completed: Thread/unthread right pants leg;Thread/unthread left pants leg;Pull pants up/down;Don/Doff right sock;Don/Doff left sock Lower body dressing/undressing: 0: Wears gown/pajamas-no public clothing  FIM - Toileting Toileting steps completed by patient: Adjust clothing prior to toileting;Performs perineal hygiene;Adjust clothing after toileting Toileting: 2: Max-Patient completed 1 of 3 steps  FIM - Archivist Transfers Assistive Devices: Art gallery manager Transfers: 3-To toilet/BSC: Mod A (lift or lower assist)  FIM - Banker Devices: Bed rails;Arm rests Bed/Chair Transfer: 5: Sit > Supine: Supervision (verbal cues/safety issues)  FIM -  Locomotion: Wheelchair Locomotion: Wheelchair: 0: Activity did not occur FIM - Locomotion: Ambulation Locomotion: Health visitor  Devices: Designer, industrial/product Ambulation/Gait Assistance: 4: Min assist Locomotion: Ambulation: 1: Travels less than 50 ft with minimal assistance (Pt.>75%)  Comprehension Comprehension Mode: Auditory Comprehension: 6-Follows complex conversation/direction: With extra time/assistive device  Expression Expression Mode: Verbal Expression Assistive Devices: 6-Communication board Expression: 6-Expresses complex ideas: With extra time/assistive device  Social Interaction Social Interaction: 6-Interacts appropriately with others with medication or extra time (anti-anxiety, antidepressant).  Problem Solving Problem Solving: 6-Solves complex problems: With extra time  Memory Memory: 6-More than reasonable amt of time  1. DVT Prophylaxis/Anticoagulation: Mechanical: Sequential compression devices, below knee Bilateral lower extremities  2. Pain Management: will monitor for back/ knee pain with increased activity.  3. Mood: still anxious at times.  Gets anxious easily over breathing.  Needs positive reinforcement. Prn xanax. Will schedule TID doses as well. 4. COPD: acute on chronic effects. On prednisone at home restart at 10mg   -recent cxr stable  -breathing seems to be steadily improving  -lasix and increased prednisone  -nebs, ezpap per [facility]  -appreciate pulmonary help  -discussed with patient and husband.  5. PSVT: Resolved on amiodarone bid, digoxin and cardizem.  6. Frequency: ua negative, culture neg  -occasional retention  -she does have a hx of bladder CA 7. Constipation: started bowel program. Scheduled senna at bedtime  LOS (Days) 8 A FACE TO FACE EVALUATION WAS PERFORMED  Dewanda Fennema T 06/12/2011, 8:17 AM

## 2011-06-12 NOTE — Patient Care Conference (Signed)
Inpatient RehabilitationTeam Conference Note Date: 06/11/2011   Time: 2:15 PM    Patient Name: [name] [name]      Medical Record Number: [id number]  Date of Birth: Jul 10, 1940 Sex: Female         Room/Bed: 4010/4010-01 Payor Info: Payor: [employer]  Plan: BLUE MEDICARE  Product Type: *No Product type*     Admitting Diagnosis: DECONDITIONED  Admit Date/Time:  06/04/2011  5:14 PM Admission Comments: No comment available   Primary Diagnosis:  Physical deconditioning Principal Problem: Physical deconditioning  Patient Active Problem List  Diagnoses Date Noted  . Physical deconditioning 06/05/2011  . Anxiety disorder 05/31/2011    Class: Chronic  . SVT (supraventricular tachycardia) 05/24/2011  . Acute-on-chronic respiratory failure 05/15/2011  . Acute exacerbation of chronic obstructive pulmonary disease (COPD) 05/15/2011  . CAD 04/23/2010  . VENTRICULAR TACHYCARDIA 04/23/2010  . ABDOMINAL AORTIC ANEURYSM 12/29/2009  . TOBACCO ABUSE 08/18/2008  . HYPERLIPIDEMIA 08/17/2008  . GLAUCOMA 08/17/2008  . HYPERTENSION 08/17/2008  . SUPRAVENTRICULAR TACHYCARDIA 08/17/2008  . CEREBROVASCULAR DISEASE 08/17/2008  . COPD 08/17/2008    Expected Discharge Date: Expected Discharge Date: 06/19/11  Team Members Present: Physician: Dr. Faith Rogue Case Manager Present: Melanee Spry, RN Social Worker Present: Amada Jupiter, LCSW Nurse Present: Daryll Brod, RN PT Present: Karolee Stamps, PT OT Present: Edwin Cap, Felipa Eth, OT SLP Present: Feliberto Gottron, SLP     Current Status/Progress Goal Weekly Team Focus  Medical   severe copd, acute on chronic respiratory failure, anxiety  increased therapy tolerance. positive reinforcement  rx fluid overload and reactive airway disease   Bowel/Bladder   Incontinent of bowel at times. Continent of bladder  Continent of bowel and bladder  Initating of bowel program   Swallow/Nutrition/ Hydration             ADL's   min A/mod A overall; decreaed activity tolerance; max encouragement to participate  supervision overall  ativity tolerance, standing balance   Mobility   max assist transfer without rw, has not participated in any mobility.  min assist gait 25' and supervision wc 25'  pt participation, OOB schedule,   Communication             Safety/Cognition/ Behavioral Observations            Pain   Scheduled Tylenol 650 mg prn q 4hrs  <3  Monitor for effectiveness   Skin   Bruising to bilateral arms and abdomen  No additional skin breakdown  Monitor for appropriate healing      *See Interdisciplinary Assessment and Plan and progress notes for long and short-term goals  Barriers to Discharge: severe pulmonary deconditioning and anxiety    Possible Resolutions to Barriers:  positive reinforcement, limit dx on goals, supervision goals    Discharge Planning/Teaching Needs:  return home with husband who can provide 24/7 assistance, however, he is hopeful she can regain some level of independent function      Team Discussion: Therapists think pt can meet supervision goals w/ continued participation in txs.  Husband will need family ed.  Pt has been able to tolerate some decrease in O2.   Revisions to Treatment Plan: none    Continued Need for Acute Rehabilitation Level of Care: The patient requires daily medical management by a physician with specialized training in physical medicine and rehabilitation for the following conditions: Daily direction of a multidisciplinary physical rehabilitation program to ensure safe treatment while eliciting the highest outcome that is  of practical value to the patient.: Yes Daily medical management of patient stability for increased activity during participation in an intensive rehabilitation regime.: Yes Daily analysis of laboratory values and/or radiology reports with any subsequent need for medication adjustment of medical intervention for : Neurological  problems;Pulmonary problems;Other  Brock Ra 06/12/2011, 2:35 PM

## 2011-06-13 DIAGNOSIS — Z5189 Encounter for other specified aftercare: Secondary | ICD-10-CM

## 2011-06-13 DIAGNOSIS — J449 Chronic obstructive pulmonary disease, unspecified: Secondary | ICD-10-CM

## 2011-06-13 DIAGNOSIS — R5381 Other malaise: Secondary | ICD-10-CM

## 2011-06-13 DIAGNOSIS — F341 Dysthymic disorder: Secondary | ICD-10-CM

## 2011-06-13 DIAGNOSIS — J9600 Acute respiratory failure, unspecified whether with hypoxia or hypercapnia: Secondary | ICD-10-CM

## 2011-06-13 NOTE — Progress Notes (Signed)
Patient ID: [name] [name], female   DOB: August 20, 1940, 71 y.o.   MRN: [id number] Subjective/Complaints: Review of Systems  Respiratory: Positive for cough and shortness of breath.   Psychiatric/Behavioral: The patient is nervous/anxious and has insomnia.   All other systems reviewed and are negative.  Coughing up clear secretions  Objective: Vital Signs: Blood pressure 131/76, pulse 68, temperature 97.9 F (36.6 C), temperature source Oral, resp. rate 19, height 5\' 5"  (1.651 m), weight 74.027 kg (163 lb 3.2 oz), SpO2 89.00%. No results found.  Basename 06/10/11 0934  WBC 8.0  HGB 10.8*  HCT 33.5*  PLT 164    Basename 06/10/11 0934  NA 139  K 3.7  CL 95*  CO2 35*  GLUCOSE 89  BUN 13  CREATININE 0.43*  CALCIUM 9.2   CBG (last 3)  No results found for this basename: GLUCAP:3 in the last 72 hours  Wt Readings from Last 3 Encounters:  06/12/11 74.027 kg (163 lb 3.2 oz)  06/03/11 71 kg (156 lb 8.4 oz)  04/01/11 72.576 kg (160 lb)    Physical Exam:  General appearance: alert, cooperative and mild distress Head: Normocephalic, without obvious abnormality, atraumatic Eyes:  Ears:  Nose: Nares normal. Septum midline. Mucosa normal. No drainage or sinus tenderness. Throat: lips, mucosa, and tongue normal; teeth and gums normal Neck: no adenopathy, no carotid bruit, no JVD, supple, symmetrical, trachea midline and thyroid not enlarged, symmetric, no tenderness/mass/nodules Back: symmetric, no curvature. ROM normal. No CVA tenderness. Resp: diminished breath sounds bilaterally crackles at bases. Better air movement.No  Exp wheezing. More comfortable. On 4L currently.  Cardio: regular rate and rhythm, S1, S2 normal, no murmur, click, rub or gallop GI: soft, non-tender; bowel sounds normal; no masses,  no organomegaly Extremities: extremities normal, atraumatic, no cyanosis or edema Pulses: 2+ and symmetric Skin: Skin color, texture, turgor normal. No rashes or lesions Neurologic:  Grossly normal generalized weakness proximal lower est predominantly.  No gross sensory findings. Cognitive deficits Incision/Wound:  Psych: remains very anxious   Assessment/Plan: 1. Functional deficits secondary to severe deconditioning related to respiratory failure which require 3+ hours per day of interdisciplinary therapy in a comprehensive inpatient rehab setting. Physiatrist is providing close team supervision and 24 hour management of active medical problems listed below. Physiatrist and rehab team continue to assess barriers to discharge/monitor patient progress toward functional and medical goals. FIM: FIM - Bathing Bathing Steps Patient Completed: Chest;Right Arm;Left Arm;Abdomen;Front perineal area;Right upper leg;Left upper leg Bathing: 3: Mod-Patient completes 5-7 68f 10 parts or 50-74%  FIM - Upper Body Dressing/Undressing Upper body dressing/undressing steps patient completed: Thread/unthread right sleeve of pullover shirt/dresss;Thread/unthread left sleeve of pullover shirt/dress;Pull shirt over trunk Upper body dressing/undressing: 4: Min-Patient completed 75 plus % of tasks FIM - Lower Body Dressing/Undressing Lower body dressing/undressing steps patient completed: Thread/unthread right pants leg;Thread/unthread left pants leg;Pull pants up/down;Don/Doff right sock;Don/Doff left sock Lower body dressing/undressing: 0: Wears gown/pajamas-no public clothing  FIM - Toileting Toileting steps completed by patient: Performs perineal hygiene Toileting: 2: Max-Patient completed 1 of 3 steps  FIM - Diplomatic Services operational officer Devices: Psychiatrist Transfers: 4-To toilet/BSC: Min A (steadying Pt. > 75%);4-From toilet/BSC: Min A (steadying Pt. > 75%)  FIM - Bed/Chair Transfer Bed/Chair Transfer Assistive Devices: Bed rails;Arm rests Bed/Chair Transfer: 5: Sit > Supine: Supervision (verbal cues/safety issues);4: Chair or W/C > Bed: Min A (steadying Pt.  > 75%)  FIM - Locomotion: Wheelchair Locomotion: Wheelchair: 0: Activity did not occur FIM -  Locomotion: Ambulation Locomotion: Ambulation Assistive Devices: Designer, industrial/product Ambulation/Gait Assistance: 4: Min assist Locomotion: Ambulation: 1: Travels less than 50 ft with total assistance/helper does all (Pt.<25%)  Comprehension Comprehension Mode: Auditory Comprehension: 5-Understands complex 90% of the time/Cues < 10% of the time  Expression Expression Mode: Verbal Expression Assistive Devices: 6-Communication board Expression: 5-Expresses complex 90% of the time/cues < 10% of the time  Social Interaction Social Interaction: 6-Interacts appropriately with others with medication or extra time (anti-anxiety, antidepressant).  Problem Solving Problem Solving: 5-Solves complex 90% of the time/cues < 10% of the time  Memory Memory: 5-Recognizes or recalls 90% of the time/requires cueing < 10% of the time  1. DVT Prophylaxis/Anticoagulation: Mechanical: Sequential compression devices, below knee Bilateral lower extremities  2. Pain Management: will monitor for back/ knee pain with increased activity.  3. Mood: still anxious at times.  Gets anxious easily over breathing.  Needs positive reinforcement. Prn xanax. Will schedule TID doses as well. 4. COPD: acute on chronic effects. On prednisone at home restart at 10mg   -recent cxr stable  -breathing seems to be steadily improving  -lasix and increased prednisone  -nebs, ezpap per [facility]  -appreciate pulmonary help  -discussed with patient and husband.  5. PSVT: Resolved on amiodarone bid, digoxin and cardizem.  6. Frequency: ua negative, culture neg  -occasional retention  -she does have a hx of bladder CA 7. Constipation: started bowel program. Scheduled senna at bedtime  LOS (Days) 9 A FACE TO FACE EVALUATION WAS PERFORMED  Lorre Opdahl E 06/13/2011, 7:56 AM

## 2011-06-13 NOTE — Progress Notes (Signed)
Physical Therapy Cancellation Note  Patient Details  Name: [name] [name] MRN: [id number] Date of Birth: 1940-10-15 Today's Date: 06/13/2011  Pt refusing PT this morning as she would like to have friend help her bathe and brush her teeth. PT educated that OT was in next and would like to help with ADLs or PT could also help however pt refusing. Pt requesting PT come back later today, will see pt this afternoon to make up missed therapy.   Sherrine Maples Cheek 06/13/2011, 10:13 AM

## 2011-06-13 NOTE — Progress Notes (Addendum)
Physical Therapy Session Note  Patient Details  Name: [name] [name] MRN: [id number] Date of Birth: 1940/06/08  Today's Date: 06/13/2011 Time: 4098-1191 Time Calculation (min): 75 min  Short Term Goals: Week 2:   Same as long term goals, updated since evaluation  Skilled Therapeutic Interventions/Progress Updates:    Pt still requires encouragement to participate, "I just need things to move at my pace." Performed gait training x 28' total with 2 seated rest breaks. First attempt at ambulation pt had Rt. LE weakness/reported buckling. Pt required moderate assist to control descent to wheelchair behind her. Performed seated exercises including long arc quads, marching 2 x 15 reps bilaterally while she recovered from ambulation. Practiced diaphragmatic breathing, pt with difficulty grasping concept. Negotiated wheelchair 76' with supervision, verbal cues for efficiency. Stopped secondary to SOB. Two stand pivot transfers performed with min-guard assist, verbal cues for safe placement of RW and placement of UEs. All sit to stands where min-guard assist, tactile cues for forward trunk lean for efficiency of standing. Discussed energy conservation techniques at home, role HHPT will play, and need to have supervision at all times. Pt verbalizing understanding.   SpO2 84% on 4L O2 at lowest during ambulation. Pt quickly returns to mid 90s with seated rest.   Therapy Documentation Precautions:  Precautions Precautions: Fall Precaution Comments: oxygen Required Braces or Orthoses DO NOT USE: No Restrictions Weight Bearing Restrictions: No Vital Signs: Oxygen Therapy SpO2: 84 % (at lowest during PT ) O2 Device: Nasal cannula O2 Flow Rate (L/min): 4 L/min (increased to 4L secondary to tank only has 3 then 4L., returned to 3.5L once back in room) Pulse Oximetry Type: Intermittent Pain: Pain Assessment Pain Assessment: No/denies pain Pain Score: 0-No pain   See FIM for current functional  status  Therapy/Group: Individual Therapy  [name] 06/13/2011, 6:33 PM

## 2011-06-13 NOTE — Progress Notes (Signed)
Occupational Therapy Note  Patient Details  Name: [name] [name] MRN: [id number] Date of Birth: August 25, 1940 Today's Date: 06/13/2011  1st session: Time: 0800-0845 Pt denies pain Individual Therapy Pt sitting in bed eating breakfast and stated that she couldn't do anything until she finished her breakfast.  Upon return pt agreed to getting out of bed and walking in room with r/w but stated that she would complete bathing and dressing later.  Pt O2 sats >86% on 3.5L O2.  Pt requires extra time to initiate and complete all tasks.  "I will do it when I'm ready."  Pt performed sit to stand and amb 3' with close supervision.  2nd Session Time: 1100-1130 Pt denies pain Individual Therapy Pt sitting on BSC.  Pt stated that she had already bathed (confirmed by care giver in room) and agreed to putting on clothes.  Pt completed dressing with min A to pull shirt over head.  Pt donned pants with steady assist when standing.  Pt required assistance with toilet hygiene.  Pt performed BSC transfer with close supervision.  Focus on activity tolerance, transfers, and safety awareness.  O2 sats>90% on 3.5L O2.  Pt exhibits SOB and increased anxiety with activity requiring multiple rest breaks.   Lavone Neri [facility] 06/13/2011, 3:46 PM

## 2011-06-14 NOTE — Progress Notes (Signed)
Occupational Therapy Note  Patient Details  Name: ELIDA HARBIN MRN: [id number] Date of Birth: [birth year] Today's Date: 06/14/2011  1st session: Time: 0900-1000 Pt denies pain Individual therapy Pt on BSC but agreed to performing bathing and dressing tasks when finished.  Pt required mod A for toileting and supervision for stand pivot transfer to recliner.  Pt completed UB bathing seated with setup.  Pt donned shirt, underpants, and pants (including pulling them up) with supervision only.  Pt continues to require extra time to initiate and complete all tasks.  Pt states, "You have to let me do this in my time."  O2 sats >90% on 3.5L O2.  Discussed with pt her improvement and encouraged her to continue to participate in therapy.  2nd Session: Time: 1300-1330 Pt denies pain Individual therapy Pt practiced w/c><bed transfers X 4 in ADL apartment with rest breaks between all tasks.  Pt completed all tasks with supervision.  Pt continues to exhibit increased anxiety with any tasks requiring standing or walking, although patient completes these tasks without physical assistance.  Encouraged patient to continue participating in therapies.   Lavone Neri [facility] 06/14/2011, 3:26 PM

## 2011-06-14 NOTE — Progress Notes (Signed)
Patient ID: [name] [name] MRN: [id number] DOB/AGE: Sep 13, 1940 71 y.o.   Lines, Tubes, etc:  ETT 3/20>>>3/22  ETT 3/24>>>3/27  Left IJ CVL 3/20>>> 3/29  Foley cath 4/3>>> 4/6  LUE PICC 4/5 >>   Microbiology:  Sputum 3/20>>> neg  BCx2 3/20>>>neg  UC 3/20>>>neg  MRSA PCR>>>neg   Antibiotics:  Rocephin 3/20>>>3/28  azithro 3/20>>>3/25  avelox 4/11 (PNA)>> 4/18  Studies/Events:  Echo 3/20>>> EF 65-70% LVH  Consults: cardiology  Subjective  Feeling much stronger and breathing easier. Close to her home o2 settings as she very frequently uses 3 liters at home.  Exam: BP 111/54  Pulse 63  Temp(Src) 98.1 F (36.7 C) (Oral)  Resp 20  Ht 5\' 5"  (1.651 m)  Wt 74.027 kg (163 lb 3.2 oz)  BMI 27.16 kg/m2  SpO2 90%  3.5 liters  General - Chronically ill appearing, NAD.  Lungs - decreased L base, occ wheeze, but improved.  Cardiac - RRR, no gallop.  Extremities - No pitting.  abd- non-tender GU: voids Neuro: intact but has generalized weakness.   PCXR: slight improvement bibasilar aeration  Lab 06/10/11 0934  NA 139  K 3.7  CL 95*  CO2 35*  BUN 13  CREATININE 0.43*  GLUCOSE 89    Lab 06/10/11 0934  HGB 10.8*  HCT 33.5*  WBC 8.0  PLT 164    Acute exacerbation of chronic respiratory failure in the setting COPD exacerbation:  Dyspnea Largely due to her debilitation, weakness, atx. Finally making some improvement and nearing baseline.  Recommendation: - Continue supplemental O2 - titrate carefully to maintain SpO2 88-94%  - Cont nebulized Levalbuterol (due to PSVT) and also continue budesonide w/EZPAP.  Eventually can go back to symbicort.  -aggressive pulmonary hygiene: continue flutter valve and IS - slowly wean prednisone -avelox d/cd 4/18 (7 days rx) -mucinex/ flutter/ mobilize
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
## 2011-06-14 NOTE — Progress Notes (Signed)
Patient ID: [name] [name], female   DOB: 1941-01-04, 71 y.o.   MRN: [id number] Subjective/Complaints: Review of Systems  Respiratory: Positive for cough and shortness of breath.   Psychiatric/Behavioral: The patient is nervous/anxious and has insomnia.   All other systems reviewed and are negative.  Discussed O2 today.  Spoke with RT as well  Objective: Vital Signs: Blood pressure 111/54, pulse 63, temperature 98.1 F (36.7 C), temperature source Oral, resp. rate 20, height 5\' 5"  (1.651 m), weight 74.027 kg (163 lb 3.2 oz), SpO2 90.00%. No results found. No results found for this basename: WBC:2,HGB:2,HCT:2,PLT:2 in the last 72 hours No results found for this basename: NA:2,K:2,CL:2,CO2:2,GLUCOSE:2,BUN:2,CREATININE:2,CALCIUM:2 in the last 72 hours CBG (last 3)  No results found for this basename: GLUCAP:3 in the last 72 hours  Wt Readings from Last 3 Encounters:  06/12/11 74.027 kg (163 lb 3.2 oz)  06/03/11 71 kg (156 lb 8.4 oz)  04/01/11 72.576 kg (160 lb)    Physical Exam:  General appearance: alert, cooperative and mild distress Head: Normocephalic, without obvious abnormality, atraumatic Eyes:  Ears:  Nose: Nares normal. Septum midline. Mucosa normal. No drainage or sinus tenderness. Throat: lips, mucosa, and tongue normal; teeth and gums normal Neck: no adenopathy, no carotid bruit, no JVD, supple, symmetrical, trachea midline and thyroid not enlarged, symmetric, no tenderness/mass/nodules Back: symmetric, no curvature. ROM normal. No CVA tenderness. Resp: diminished breath sounds bilaterally . Better air movement.Mild Exp wheezing. More comfortable. On 3.5L currently.  Cardio: regular rate and rhythm, S1, S2 normal, no murmur, click, rub or gallop GI: soft, non-tender; bowel sounds normal; no masses,  no organomegaly Extremities: extremities normal, atraumatic, no cyanosis or edema Pulses: 2+ and symmetric Skin: Skin color, texture, turgor normal. No rashes or  lesions Neurologic: Grossly normal generalized weakness proximal lower est predominantly.  No gross sensory findings. Cognitive deficits Incision/Wound:  Psych: remains very anxious   Assessment/Plan: 1. Functional deficits secondary to severe deconditioning related to respiratory failure which require 3+ hours per day of interdisciplinary therapy in a comprehensive inpatient rehab setting. Physiatrist is providing close team supervision and 24 hour management of active medical problems listed below. Physiatrist and rehab team continue to assess barriers to discharge/monitor patient progress toward functional and medical goals. FIM: FIM - Bathing Bathing Steps Patient Completed: Chest;Right Arm;Left Arm;Abdomen;Front perineal area;Right upper leg;Left upper leg Bathing: 3: Mod-Patient completes 5-7 32f 10 parts or 50-74%  FIM - Upper Body Dressing/Undressing Upper body dressing/undressing steps patient completed: Thread/unthread right sleeve of pullover shirt/dresss;Thread/unthread left sleeve of pullover shirt/dress;Pull shirt over trunk Upper body dressing/undressing: 4: Min-Patient completed 75 plus % of tasks FIM - Lower Body Dressing/Undressing Lower body dressing/undressing steps patient completed: Thread/unthread right pants leg;Thread/unthread left pants leg;Pull pants up/down Lower body dressing/undressing: 3: Mod-Patient completed 50-74% of tasks  FIM - Toileting Toileting steps completed by patient: Adjust clothing prior to toileting;Adjust clothing after toileting Toileting: 3: Mod-Patient completed 2 of 3 steps  FIM - Diplomatic Services operational officer Devices: Bedside commode Toilet Transfers: 5-To toilet/BSC: Supervision (verbal cues/safety issues);5-From toilet/BSC: Supervision (verbal cues/safety issues)  FIM - Bed/Chair Transfer Bed/Chair Transfer Assistive Devices: Bed rails;Arm rests Bed/Chair Transfer: 6: More than reasonable amt of time;4: Bed > Chair or  W/C: Min A (steadying Pt. > 75%);4: Chair or W/C > Bed: Min A (steadying Pt. > 75%)  FIM - Locomotion: Wheelchair Distance: 50' Locomotion: Wheelchair: 2: Travels 50 - 149 ft with supervision, cueing or coaxing FIM - Locomotion: Ambulation Locomotion: Ambulation Assistive Devices:  Walker - Rolling Ambulation/Gait Assistance: 4: Min assist Locomotion: Ambulation: 1: Travels less than 50 ft with minimal assistance (Pt.>75%)  Comprehension Comprehension Mode: Auditory Comprehension: 5-Understands complex 90% of the time/Cues < 10% of the time  Expression Expression Mode: Verbal Expression Assistive Devices: 6-Communication board Expression: 5-Expresses complex 90% of the time/cues < 10% of the time  Social Interaction Social Interaction: 6-Interacts appropriately with others with medication or extra time (anti-anxiety, antidepressant).  Problem Solving Problem Solving: 5-Solves complex 90% of the time/cues < 10% of the time  Memory Memory: 5-Recognizes or recalls 90% of the time/requires cueing < 10% of the time  1. DVT Prophylaxis/Anticoagulation: Mechanical: Sequential compression devices, below knee Bilateral lower extremities  2. Pain Management: will monitor for back/ knee pain with increased activity.  3. Mood: still anxious at times.  Gets anxious easily over breathing.  Needs positive reinforcement. Prn xanax. Will schedule TID doses as well. 4. COPD: acute on chronic effects. On prednisone at home restart at 10mg   -recent cxr stable  -breathing seems to be steadily improving  -lasix and increased prednisone  -nebs, ezpap per [facility]  -appreciate pulmonary help  -discussed with patient and husband.  5. PSVT: Resolved on amiodarone bid, digoxin and cardizem.  6. Frequency: ua negative, culture neg  -occasional retention  -she does have a hx of bladder CA 7. Constipation: started bowel program. Scheduled senna at bedtime  LOS (Days) 10 A FACE TO FACE EVALUATION WAS  PERFORMED  Jatavion Peaster E 06/14/2011, 7:48 AM

## 2011-06-14 NOTE — Progress Notes (Signed)
Social Work Patient ID: [name] [name], female   DOB: [birth year], 71 y.o.   MRN: [id number]  Spoke with pt's husband this afternoon to inform him of therapy recommendation that they have a ramp for home entry.  He is agreeable with this and states it will be "no problem" to have this ready by next week's d/c.  Kejon Feild

## 2011-06-15 DIAGNOSIS — J449 Chronic obstructive pulmonary disease, unspecified: Secondary | ICD-10-CM

## 2011-06-15 DIAGNOSIS — F341 Dysthymic disorder: Secondary | ICD-10-CM

## 2011-06-15 DIAGNOSIS — R5381 Other malaise: Secondary | ICD-10-CM

## 2011-06-15 DIAGNOSIS — Z5189 Encounter for other specified aftercare: Secondary | ICD-10-CM

## 2011-06-15 DIAGNOSIS — J4489 Other specified chronic obstructive pulmonary disease: Secondary | ICD-10-CM

## 2011-06-15 DIAGNOSIS — J9600 Acute respiratory failure, unspecified whether with hypoxia or hypercapnia: Secondary | ICD-10-CM

## 2011-06-15 NOTE — Progress Notes (Signed)
Patient ID: [name] LITZAU, female   DOB: November 22, 1940, 71 y.o.   MRN: [id number] Patient ID: [name] [name], female   DOB: [birth year], 71 y.o.   MRN: [id number] Subjective/Complaints: Review of Systems  Respiratory: Positive for cough and shortness of breath.   Psychiatric/Behavioral: The patient is nervous/anxious and has insomnia.   All other systems reviewed and are negative.  4/20.  Oxygen saturations continued to fluctuate widely but at times in the mid 90s. Examination- nasal cannula oxygen in place- completing a nebulizer treatment Chest prolonged expiratory phase with a few scattered wheezes Cardiovascular exam normal S1-S2 rate 90-95; abdomen benign no distention Extremities slight puffiness right foot  BP Readings from Last 3 Encounters:  06/15/11 134/72  06/04/11 112/58  04/01/11 149/79    Objective: Vital Signs: Blood pressure 134/72, pulse 82, temperature 98.1 F (36.7 C), temperature source Oral, resp. rate 22, height 5\' 5"  (1.651 m), weight 163 lb 3.2 oz (74.027 kg), SpO2 92.00%. No results found. No results found for this basename: WBC:2,HGB:2,HCT:2,PLT:2 in the last 72 hours No results found for this basename: NA:2,K:2,CL:2,CO2:2,GLUCOSE:2,BUN:2,CREATININE:2,CALCIUM:2 in the last 72 hours CBG (last 3)  No results found for this basename: GLUCAP:3 in the last 72 hours  Wt Readings from Last 3 Encounters:  06/12/11 163 lb 3.2 oz (74.027 kg)  06/03/11 156 lb 8.4 oz (71 kg)  04/01/11 160 lb (72.576 kg)    Physical Exam:  General appearance: alert, cooperative and mild distress Head: Normocephalic, without obvious abnormality, atraumatic Eyes:  Ears:  Nose: Nares normal. Septum midline. Mucosa normal. No drainage or sinus tenderness. Throat: lips, mucosa, and tongue normal; teeth and gums normal Neck: no adenopathy, no carotid bruit, no JVD, supple, symmetrical, trachea midline and thyroid not enlarged, symmetric, no tenderness/mass/nodules Back: symmetric, no curvature.  ROM normal. No CVA tenderness. Resp: diminished breath sounds bilaterally . Better air movement.Mild Exp wheezing. More comfortable. On 3.5L currently.  Cardio: regular rate and rhythm, S1, S2 normal, no murmur, click, rub or gallop GI: soft, non-tender; bowel sounds normal; no masses,  no organomegaly Extremities: extremities normal, atraumatic, no cyanosis or edema Pulses: 2+ and symmetric Skin: Skin color, texture, turgor normal. No rashes or lesions Neurologic: Grossly normal generalized weakness proximal lower est predominantly.  No gross sensory findings. Cognitive deficits Incision/Wound:  Psych: remains very anxious   Assessment/Plan: 1. Functional deficits secondary to severe deconditioning related to respiratory failure which require 3+ hours per day of interdisciplinary therapy in a comprehensive inpatient rehab setting. Physiatrist is providing close team supervision and 24 hour management of active medical problems listed below. Physiatrist and rehab team continue to assess barriers to discharge/monitor patient progress toward functional and medical goals. FIM: FIM - Bathing Bathing Steps Patient Completed: Chest;Right Arm;Left Arm;Abdomen;Front perineal area;Right upper leg;Left upper leg Bathing: 3: Mod-Patient completes 5-7 57f 10 parts or 50-74%  FIM - Upper Body Dressing/Undressing Upper body dressing/undressing steps patient completed: Thread/unthread right sleeve of pullover shirt/dresss;Thread/unthread left sleeve of pullover shirt/dress;Put head through opening of pull over shirt/dress;Pull shirt over trunk Upper body dressing/undressing: 5: Set-up assist to: Obtain clothing/put away FIM - Lower Body Dressing/Undressing Lower body dressing/undressing steps patient completed: Thread/unthread right underwear leg;Thread/unthread left underwear leg;Pull underwear up/down;Pull pants up/down;Thread/unthread left pants leg;Thread/unthread right pants leg Lower body  dressing/undressing: 4: Min-Patient completed 75 plus % of tasks  FIM - Toileting Toileting steps completed by patient: Adjust clothing prior to toileting;Adjust clothing after toileting Toileting: 3: Mod-Patient completed 2 of 3 steps  FIM - Toilet Transfers  Museum/gallery curator Devices: Psychiatrist Transfers: 5-To toilet/BSC: Supervision (verbal cues/safety issues)  FIM - Banker Devices: Therapist, occupational: 6: More than reasonable amt of time;5: Bed > Chair or W/C: Supervision (verbal cues/safety issues);5: Chair or W/C > Bed: Supervision (verbal cues/safety issues)  FIM - Locomotion: Wheelchair Distance: 50' Locomotion: Wheelchair: 2: Travels 50 - 149 ft with supervision, cueing or coaxing FIM - Locomotion: Ambulation Locomotion: Ambulation Assistive Devices: Designer, industrial/product Ambulation/Gait Assistance: 5: Supervision (very close supervision) Locomotion: Ambulation: 1: Travels less than 50 ft with supervision/safety issues  Comprehension Comprehension Mode: Auditory Comprehension: 5-Understands complex 90% of the time/Cues < 10% of the time  Expression Expression Mode: Verbal Expression Assistive Devices: 6-Communication board Expression: 5-Expresses complex 90% of the time/cues < 10% of the time  Social Interaction Social Interaction: 6-Interacts appropriately with others with medication or extra time (anti-anxiety, antidepressant).  Problem Solving Problem Solving: 5-Solves complex 90% of the time/cues < 10% of the time  Memory Memory: 5-Recognizes or recalls 90% of the time/requires cueing < 10% of the time  1. DVT Prophylaxis/Anticoagulation: Mechanical: Sequential compression devices, below knee Bilateral lower extremities  2. Pain Management: will monitor for back/ knee pain with increased activity.  3. Mood: still anxious at times.  Gets anxious easily over breathing.  Needs positive reinforcement.  Prn xanax. Will schedule TID doses as well. 4. COPD: acute on chronic effects. On prednisone at home restart at 10mg   -recent cxr stable  -breathing seems to be steadily improving  -lasix and increased prednisone  -nebs, ezpap per [facility]  -appreciate pulmonary help  -discussed with patient and husband.  5. PSVT: Resolved on amiodarone bid, digoxin and cardizem.  6. Frequency: ua negative, culture neg  -occasional retention  -she does have a hx of bladder CA 7. Constipation: started bowel program. Scheduled senna at bedtime  LOS (Days) 11 A FACE TO FACE EVALUATION WAS PERFORMED  Rogelia Boga 06/15/2011, 10:12 AM

## 2011-06-15 NOTE — Progress Notes (Signed)
Physical Therapy Session Note  Patient Details  Name: [name] [name] MRN: [id number] Date of Birth: 1940/05/06  Today's Date: 06/15/2011 Time: 0815-0900 Time Calculation (min): 45 min  Short Term Goals: Week 1:  PT Short Term Goal 1 (Week 1): Pt will be modified independent with bed mobility, SpO2 >/= 90% upon sitting EOB. PT Short Term Goal 1 - Progress (Week 1): Progressing toward goal PT Short Term Goal 2 (Week 1): Pt will ambulate 50 feet with min assist, SpO2>/= 88% PT Short Term Goal 3 (Week 1): Pt will perform 3 sit to/from stands during session with supervision, SpO2 >/= 88% PT Short Term Goal 3 - Progress (Week 1): Progressing toward goal       Therapy Documentation Precautions:  Precautions Precautions: Fall Precaution Comments: oxygen Required Braces or Orthoses DO NOT USE: No Restrictions Weight Bearing Restrictions: No Vital Signs: Therapy Vitals Temp: 97.5 F (36.4 C) Temp src: Oral Pulse Rate: 66  Resp: 19  BP: 116/66 mmHg Patient Position, if appropriate: Lying Oxygen Therapy SpO2: 98 % O2 Device: Nasal cannula O2 Flow Rate (L/min): 4 L/min Pain: Pain Assessment Pain Score: 0-No pain  Skilled Therapeutic Interventions/Progress Updates:  Treatment focus: Activity tolerance with transfers and short gait distance with RW( 5-34ft MinA), functional standing (with UE support)  and seated activity including LE exercises (AROM reps of 10).  Activity was limited due to pt extreme fluctuations with HR and SPO2.  Multiple changes were made to O2 amount 4L-8L to keep SPO2>88% (pt kept dropping to <88%). RN made aware and notified MD.  See FIM for current functional status  Therapy/Group: Individual Therapy   Hortencia Conradi, PTA 06/15/2011, 6:19 PM

## 2011-06-15 NOTE — Progress Notes (Signed)
Occupational Therapy Session Note  Patient Details  Name: [name] [name] MRN: [id number] Date of Birth: 1940-08-30  Today's Date: 06/15/2011 Time: 6578-4696 Time Calculation (min): 30 min   Skilled Therapeutic Interventions/Progress Updates:    Pt in recliner resting and agreed to UB bathing and dressing stating that she was too tired to do anything else.  Pt completed UB bathing and dressing with supervision, requiring rest breaks X 5 during 30 min session. O2 sats >90% on 5L O2.  Pt exhibited increased anxiety when relating that the previous night had not been good and her husband was not feeling well.  Pt concerned about her progress.  Focus on activity tolerance.  Therapy Documentation Precautions:  Precautions Precautions: Fall Precaution Comments: oxygen Required Braces or Orthoses DO NOT USE: No Restrictions Weight Bearing Restrictions: No General: General Amount of Missed OT Time (min): 15 Minutes Pain: Pain Assessment Pain Assessment: No/denies pain Pain Score: 0-No pain  See FIM for current functional status  Therapy/Group: Individual Therapy  Rich Brave 06/15/2011, 10:17 AM

## 2011-06-15 NOTE — Progress Notes (Signed)
Patient with anxiety. She feels no one is listening to her. Her husband is sick at home.scheduled xanax given.  With therapy o2 sats72 - 96 % per report. At 1024 96% on 5 liter o2. Reported to md. No order given.

## 2011-06-16 NOTE — Progress Notes (Signed)
Physical Therapy Note  Patient Details  Name: [name] [name] MRN: [id number] Date of Birth: 06-11-1940 Today's Date: 06/16/2011  15:15-16:00 individual therapy pt denied pain and declined xanex  Bed mobility mod I. Stand pivot transfer to [facility] with rw supervision. Home environment 10' with rw min assist with pt managing O2 tubing, intitial cues for technique and vc for walker placement. Controlled gait 25' with rw min assist with vc for walker placement. O2 on 4 LO2 91% HR from 88bpm- 105bpm   Julian Reil 06/16/2011, 4:09 PM

## 2011-06-16 NOTE — Progress Notes (Signed)
Patient ID: [name] [name], female DOB: Jul 12, 1940, 71 y.o. MRN: [id number] Subjective: Very anxious and concerned about no O2 in tank - wants to go home "early" if possible  Objective: Vital signs in last 24 hours: Temp:  [97.5 F (36.4 C)-98.1 F (36.7 C)] 98.1 F (36.7 C) (04/21 0445) Pulse Rate:  [66-71] 68  (04/21 0445) Resp:  [16-19] 19  (04/21 0445) BP: (100-117)/(55-66) 117/63 mmHg (04/21 0445) SpO2:  [91 %-100 %] 98 % (04/21 0720) Weight change:  Last BM Date: 06/14/11  Intake/Output from previous day: 04/20 0701 - 04/21 0700 In: 720 [P.O.:720] Out: 5 [Urine:5] Last cbgs: CBG (last 3)  No results found   Physical Exam General: No apparent distress   - very anxious  Lungs: Normal effort. Lungs clear to auscultation, no crackles or wheezes. Cardiovascular: Regular rate and rhythm, no edema Musculoskeletal:  Neurovascularly intact Neurological: No new neurological deficits Wounds: N/A  Lab Results: No results found  Studies/Results: No results found.  Medications: I have reviewed the patient's current medications.  Assessment: 1. Functional deficits secondary to severe deconditioning related to respiratory failure which require 3+ hours per day of interdisciplinary therapy in a comprehensive inpatient rehab setting.  Physiatrist is providing close team supervision and 24 hour management of active medical problems listed below.  Physiatrist and rehab team continue to assess barriers to discharge/monitor patient progress toward functional and medical goals  Plan: 1. DVT Prophylaxis/Anticoagulation: Mechanical: Sequential compression devices, below knee Bilateral lower extremities  2. Pain Management: will monitor for back/ knee pain with increased activity.  3. Mood: still anxious at times. Gets anxious easily over breathing. Needs positive reinforcement. Prn xanax. Will schedule TID doses as well.  4. COPD: acute on chronic hypoxic resp failure. On prednisone at home  restart at 10mg   -recent cxr stable  -breathing seems to be steadily improving  -lasix and increased prednisone  -nebs, ezpap per [facility]  -appreciate pulmonary help  -discussed with patient 5. PSVT: Resolved on amiodarone bid, digoxin and cardizem.  6. Frequency: ua negative, culture neg  -occasional retention  -she does have a hx of bladder CA  7. Constipation: started bowel program. Scheduled senna at bedtime  LOS days 12    IRETON,SUSAN C , PA-C 06/16/2011, 9:53 AM  I agree with above.  Rene Paci, MD  06/16/11 9:54 AM

## 2011-06-16 NOTE — Progress Notes (Signed)
Noted soft bump on lt shin. No tenderness to touch. Patient became very upset .Demanding the md come in to see it. I gave her a xanax., and calmed her down. Notified  Doctor of problem. No orders given.

## 2011-06-17 DIAGNOSIS — R0600 Dyspnea, unspecified: Secondary | ICD-10-CM

## 2011-06-17 DIAGNOSIS — R0989 Other specified symptoms and signs involving the circulatory and respiratory systems: Secondary | ICD-10-CM

## 2011-06-17 NOTE — Progress Notes (Signed)
Patient ID: [name] [name] MRN: [id number] DOB/AGE: 1940/07/28 71 y.o.   Lines, Tubes, etc:  ETT 3/20>>>3/22 , ETT 3/24>>>3/27  Left IJ CVL 3/20>>> 3/29  Foley cath 4/3>>> 4/6  LUE PICC 4/5 >>   Microbiology:  Sputum 3/20>>> neg  BCx2 3/20>>>neg  UC 3/20>>>neg  MRSA PCR>>>neg   Antibiotics:  Rocephin 3/20>>>3/28  azithro 3/20>>>3/25  avelox 4/11 (PNA)>> 4/18  Studies/Events:  Echo 3/20>>> EF 65-70% LVH  Consults: cardiology  Subjective  Feeling much stronger and breathing easier.  Just finished PT exercise and dyspneic at rest and combing hair but thinks this is due to exercise.   Exam: BP 103/62  Pulse 63  Temp(Src) 98.1 F (36.7 C) (Oral)  Resp 17  Ht 5\' 5"  (1.651 m)  Wt 74.027 kg (163 lb 3.2 oz)  BMI 27.16 kg/m2  SpO2 98%  3.5 liters  General - Chronically ill appearing, NAD.  Lungs - decreased L base, occ wheeze, but improved.  Cardiac - RRR, no gallop.  Extremities - No pitting.  abd- non-tender GU: voids Neuro: intact but has generalized weakness.   PCXR: slight improvement bibasilar aeration No results found for this basename: NA:3,K:3,CL:3,CO2:3,BUN:3,CREATININE:3,GLUCOSE:3 in the last 168 hours No results found for this basename: HGB:3,HCT:3,WBC:3,PLT:3 in the last 168 hours  Acute exacerbation of chronic respiratory failure in the setting COPD exacerbation CLass 4 dyspnea Possibly hypercapnic COPD at baseline   -  Dyspnea Largely due to her debilitation, weakness, and copd atx. Finally making some improvement and nearing baseline.  But on 06/17/11 exam still dyspneic even combing hair though witness PT says she is improved from admission and dyspnea attributed to completed therapy. She is anticipating discharge in 2 days  Recommendation: - Continue supplemental O2 - titrate carefully to maintain SpO2 > 88%  - Cont nebulized Levalbuterol (due to PSVT) and also continue budesonide w/EZPAP.  Eventually can go back to symbicort.  -aggressive pulmonary  hygiene: continue flutter valve and IS - slowly wean prednisone --mucinex/ flutter/ mobilize - ? Needs goals of care discussed and consideration of morphine supplement for class 4 dyspnea (will check abg)  Prior to discharge

## 2011-06-17 NOTE — Progress Notes (Addendum)
Occupational Therapy Session Note  Patient Details  Name: [name] [name] MRN: [id number] Date of Birth: 17-Oct-1940  Today's Date: 06/17/2011 Time: 0905-1000 Time Calculation (min): 55 min   Skilled Therapeutic Interventions/Progress Updates:    ADL retraining including bathing and dressing w/c level at sink.  Pt required rest break after walking approx 5' to w/c at sink.  O2 sats 90% on 3.5L O2.  Pt completed all tasks with supervision except bathing feet and donning socks.  Pt continues to exhibit increased anxiety with transitional movements and exhibiting SOB although O2 sats remain above 85%.  Discussed discharge plans and activity levels/energy conservation strategies.  Continued focus on increased activity tolerance.   Therapy Documentation Precautions:  Precautions Precautions: Fall Precaution Comments: oxygen Required Braces or Orthoses DO NOT USE: No Restrictions Weight Bearing Restrictions: No   Pain: Pain Assessment Pain Assessment: No/denies pain    See FIM for current functional status  Therapy/Group: Individual Therapy  Rich Brave 06/17/2011, 1:24 PM

## 2011-06-17 NOTE — Progress Notes (Signed)
Patient ID: [name] [name], female   DOB: December 27, 1940, 71 y.o.   MRN: [id number] Subjective/Complaints: Review of Systems  Respiratory: Positive for cough and shortness of breath.   Psychiatric/Behavioral: The patient is nervous/anxious and has insomnia.   All other systems reviewed and are negative.  Am I going going home on Wed?  Objective: Vital Signs: Blood pressure 103/62, pulse 63, temperature 98.1 F (36.7 C), temperature source Oral, resp. rate 17, height 5\' 5"  (1.651 m), weight 74.027 kg (163 lb 3.2 oz), SpO2 98.00%. No results found. No results found for this basename: WBC:2,HGB:2,HCT:2,PLT:2 in the last 72 hours No results found for this basename: NA:2,K:2,CL:2,CO2:2,GLUCOSE:2,BUN:2,CREATININE:2,CALCIUM:2 in the last 72 hours CBG (last 3)  No results found for this basename: GLUCAP:3 in the last 72 hours  Wt Readings from Last 3 Encounters:  06/12/11 74.027 kg (163 lb 3.2 oz)  06/03/11 71 kg (156 lb 8.4 oz)  04/01/11 72.576 kg (160 lb)    Physical Exam:  General appearance: alert, cooperative and mild distress Head: Normocephalic, without obvious abnormality, atraumatic Eyes:  Ears:  Nose: Nares normal. Septum midline. Mucosa normal. No drainage or sinus tenderness. Throat: lips, mucosa, and tongue normal; teeth and gums normal Neck: no adenopathy, no carotid bruit, no JVD, supple, symmetrical, trachea midline and thyroid not enlarged, symmetric, no tenderness/mass/nodules Back: symmetric, no curvature. ROM normal. No CVA tenderness. Resp: diminished breath sounds bilaterally . Better air movement.Mild Exp wheezing. More comfortable. On 3.5L currently.  Cardio: regular rate and rhythm, S1, S2 normal, no murmur, click, rub or gallop GI: soft, non-tender; bowel sounds normal; no masses,  no organomegaly Extremities: extremities normal, atraumatic, no cyanosis or edema Pulses: 2+ and symmetric Skin: Skin color, texture, turgor normal. No rashes or lesions Neurologic: Grossly  normal generalized weakness proximal lower est predominantly.  No gross sensory findings. Cognitive deficits Incision/Wound:  Psych: remains very anxious   Assessment/Plan: 1. Functional deficits secondary to severe deconditioning related to respiratory failure which require 3+ hours per day of interdisciplinary therapy in a comprehensive inpatient rehab setting. Physiatrist is providing close team supervision and 24 hour management of active medical problems listed below. Physiatrist and rehab team continue to assess barriers to discharge/monitor patient progress toward functional and medical goals. FIM: FIM - Bathing Bathing Steps Patient Completed: Chest;Right Arm;Left Arm;Abdomen (pt declined LB bathing) Bathing: 2: Max-Patient completes 3-4 31f 10 parts or 25-49%  FIM - Upper Body Dressing/Undressing Upper body dressing/undressing steps patient completed: Thread/unthread right sleeve of pullover shirt/dresss;Thread/unthread left sleeve of pullover shirt/dress;Put head through opening of pull over shirt/dress;Pull shirt over trunk Upper body dressing/undressing: 5: Set-up assist to: Obtain clothing/put away FIM - Lower Body Dressing/Undressing Lower body dressing/undressing steps patient completed: Thread/unthread right underwear leg;Thread/unthread left underwear leg;Pull underwear up/down;Pull pants up/down;Thread/unthread left pants leg;Thread/unthread right pants leg Lower body dressing/undressing: 4: Min-Patient completed 75 plus % of tasks  FIM - Toileting Toileting steps completed by patient: Adjust clothing prior to toileting;Performs perineal hygiene;Adjust clothing after toileting Toileting: 4: Steadying assist  FIM - Diplomatic Services operational officer Devices: Bedside commode Toilet Transfers: 4-To toilet/BSC: Min A (steadying Pt. > 75%);4-From toilet/BSC: Min A (steadying Pt. > 75%)  FIM - Bed/Chair Transfer Bed/Chair Transfer Assistive Devices: Diplomatic Services operational officer: 6: Supine > Sit: No assist;4: Bed > Chair or W/C: Min A (steadying Pt. > 75%);4: Chair or W/C > Bed: Min A (steadying Pt. > 75%)  FIM - Locomotion: Wheelchair Distance: 50' Locomotion: Wheelchair: 0: Activity did not occur FIM - Locomotion:  Ambulation Locomotion: Ambulation Assistive Devices: Designer, industrial/product Ambulation/Gait Assistance: 4: Min guard Locomotion: Ambulation: 1: Travels less than 50 ft with minimal assistance (Pt.>75%)  Comprehension Comprehension Mode: Auditory Comprehension: 5-Follows basic conversation/direction: With no assist  Expression Expression Mode: Verbal Expression Assistive Devices: 6-Communication board Expression: 5-Expresses basic needs/ideas: With no assist  Social Interaction Social Interaction: 5-Interacts appropriately 90% of the time - Needs monitoring or encouragement for participation or interaction.  Problem Solving Problem Solving: 5-Solves basic problems: With no assist  Memory Memory: 5-Recognizes or recalls 90% of the time/requires cueing < 10% of the time  1. DVT Prophylaxis/Anticoagulation: Mechanical: Sequential compression devices, below knee Bilateral lower extremities  2. Pain Management: will monitor for back/ knee pain with increased activity.  3. Mood: still anxious at times.  Gets anxious easily over breathing.  Needs positive reinforcement. Prn xanax. Will schedule TID doses as well. 4. COPD: acute on chronic effects. On prednisone at home restart at 10mg   -recent cxr stable  -breathing seems to be steadily improving  -lasix and increased prednisone  -nebs, ezpap per [facility]  -appreciate pulmonary help  5. PSVT: Resolved on amiodarone bid, digoxin and cardizem.  6. Frequency: ua negative, culture neg  -occasional retention  -she does have a hx of bladder CA 7. Constipation: started bowel program. Scheduled senna at bedtime  LOS (Days) 13 A FACE TO FACE EVALUATION WAS PERFORMED  Griffen Frayne E 06/17/2011, 7:32  AM

## 2011-06-17 NOTE — Progress Notes (Signed)
Physical Therapy Session Note  Patient Details  Name: [name] [name] MRN: [id number] Date of Birth: 09-30-1940  Today's Date: 06/17/2011 Time: 1130-1200 Time Calculation (min): 30 min  Short Term Goals: See d/c goals  Skilled Therapeutic Interventions/Progress Updates:    Therpeutic exercise in standing for strenght, balance and activity tolerance with frequent rest breaks to maintain O2 sats >89% on 3.5 L, HR up to 112; pt c/o LE getting tired and fear of knee buckling; pt had 1 LOB posteriorly with first toe raise but then without LOB-used RW for stability Gait with obstacle, sidestep with RW min steady A d/t decreased safety on turn when going to sit in chair; )2 86% with gait Discussed energy conservation and safety on rest breaks, pt anxious but declined xanax Resting vitals HR 88, O2 93% Therapy Documentation Precautions:  Precautions Precautions: Fall Precaution Comments: c/o R knee buckling, monitor O2 Required Braces or Orthoses DO NOT USE: No Restrictions Weight Bearing Restrictions: No      Pain: Pain Assessment Pain Assessment: No/denies pain Mobility: Bed Mobility Bed Mobility: Supine to Sit Left Sidelying to Sit: 6: Modified independent (Device/Increase time);HOB elevated;With rails Transfers Sit to Stand: 6: Modified independent (Device/Increase time) Stand to Sit: 5: Supervision Stand to Sit Details: uncontrolled descent with fatigue Locomotion : Ambulation Ambulation/Gait Assistance: 4: Min assist Assistive device: Rolling walker Ambulation/Gait Assistance Details: decreased safety on turn when anxiety and fatigue increase; practiced side stepping    Balance:I seated EOB   Exercises: General Exercises - Lower Extremity Toe Raises: 10 reps;AROM;Standing Heel Raises: 10 reps;Standing Standing Knee Flexion: 10 reps;Both;Strengthening Other Treatments:    See FIM for current functional status  Therapy/Group: Individual Therapy  Michaelene Song 06/17/2011, 2:39 PM

## 2011-06-17 NOTE — Progress Notes (Signed)
Physical Therapy Note  Patient Details  Name: [name] [name] MRN: [id number] Date of Birth: 1940/07/27 Today's Date: 06/17/2011  11:30-12:00 individual therapy pt denied pain.  Family education performed with friend for tranfers, gait, car transfer, and wc set up. Pt propelled wc 66' with supervision, gait x 15' with rw minguard assist, car transfer with min assist with rw vc for hand placement with sit to stand. Equipment was ordered and HHPT.   Julian Reil 06/17/2011, 12:08 PM

## 2011-06-18 LAB — BLOOD GAS, ARTERIAL
Drawn by: 320991
O2 Content: 2.5 L/min
pCO2 arterial: 58.5 mmHg (ref 35.0–45.0)
pH, Arterial: 7.43 — ABNORMAL HIGH (ref 7.350–7.400)

## 2011-06-18 NOTE — Progress Notes (Signed)
Occupational Therapy Session Note  Patient Details  Name: [name] [name] MRN: [id number] Date of Birth: [birth year]  Today's Date: 06/18/2011 Time: 1000-1100 Time Calculation (min): 60 min   Skilled Therapeutic Interventions/Progress Updates:    ADL retraining including bathing and dressing w/c level at sink.  Pt declined shower stating that it was too exhausting.  Caregiver Arline Asp present for education.  Pt completes all tasks at supervision level with multiple rest breaks secondary to SOB.  Pt's O2 sats >86% on 2.5L O2.  Caregiver provides appropriate assistance (obtaining supplies and clothing).  Pt and caregiver educated in connecting O2 tubing to portable O2 tank in the even of power outage at home.    Therapy Documentation Precautions:  Precautions Precautions: Fall Precaution Comments: c/o R knee buckling, monitor O2 Required Braces or Orthoses DO NOT USE: No Restrictions Weight Bearing Restrictions: No   Pain: Pain Assessment Pain Assessment: No/denies pain  See FIM for current functional status  Therapy/Group: Individual Therapy  Rich Brave 06/18/2011, 11:09 AM

## 2011-06-18 NOTE — Progress Notes (Addendum)
Patient ID: [name] [name] MRN: [id number] DOB/AGE: 70-Nov-1942 70 y.o. Date of admit 06/04/2011  5:14 PM LOS 15 days   Lines, Tubes, etc:  ETT 3/20>>>3/22 , ETT 3/24>>>3/27  Left IJ CVL 3/20>>> 3/29  Foley cath 4/3>>> 4/6  LUE PICC 4/5 >>  ? date  Microbiology:  Sputum 3/20>>> neg  BCx2 3/20>>>neg  UC 3/20>>>neg  MRSA PCR>>>neg   Antibiotics:  Rocephin 3/20>>>3/28  azithro 3/20>>>3/25  avelox 4/11 (PNA)>> 4/18  Studies/Events:  Echo 3/20>>> EF 65-70% LVH  Consults: cardiology  Subjective  Feeling much stronger and breathing easier.  Anticipates dc home today home health and good family support. No complaints. Very keen on going home today asap  Exam: BP 131/59  Pulse 80  Temp(Src) 98.1 F (36.7 C) (Oral)  Resp 19  Ht 5\' 5"  (1.651 m)  Wt 74.027 kg (163 lb 3.2 oz)  BMI 27.16 kg/m2  SpO2 97%  3.5 liters  General - Chronically ill appearing, NAD.  Lungs - decreased L base, NO wheeze. Not dyspneic at rest similar to yesterday. Looks better  Cardiac - RRR, no gallop.  Extremities - No pitting.  abd- non-tender GU: voids Neuro: intact but has generalized weakness.   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 Finally making some improvement and at baseline.  But on 06/17/11 exam still dyspneic even combing hair though witness PT says she is improved from  admission and dyspnea attributed to completed therapy. On 4/24 - no dyspnea at rest and appears baseline. She is anticipating discharge today. She has hypercapnia which i think is baseline  Recommendation: - ok to go home from pccm standpoint - Continue supplemental O2 - titrate carefully to maintain SpO2 > 88%  - Cont nebulized Levalbuterol (due to PSVT) and also continue budesonide w/EZPAP.  Eventually can go back to symbicort.  -aggressive pulmonary hygiene: continue flutter valve and IS - slowly wean prednisone and stop over next week  - OPD FU with Dr Sherene Sires or NP < 7 days  - fixed 9am 06/04/11 at [facility] - Needs opd spirometry  - Needs goals of care established esp at followup even if she is getting home PT (she seems to have advanced copd)  Dr. Kalman Shan, M.D., [facility].C.P Pulmonary and Critical Care Medicine Staff Physician [facility] Pager: [phone], If no answer or between  15:00h - 7:00h: call [phone]  06/18/2011 9:41 AM

## 2011-06-18 NOTE — Progress Notes (Signed)
Occupational Therapy Discharge Summary  Patient Details  Name: [name] [name] MRN: [id number] Date of Birth: [birth year]  Today's Date: 06/18/2011  Patient has met 11 of 11 long term goals due to improved activity tolerance, improved balance, postural control, ability to compensate for deficits and improved coordination.  Pt made steady progress this admission and is supervision for bathing, LB dressing, toilet transfers and toileting, and tub transfers; independent for grooming and UB dressing.  Husband and caregiver have been present for education and have demonstrated a clear understanding of appropriate assistance required at home.  Pt continues to require extra time to complete tasks and requires multiple rest breaks.  Pt appropriately employs energy conservation strategies during functional tasks. Patient to discharge at overall Supervision level.  Patient's care partner is independent to provide the necessary supervision prn at discharge.    Reasons goals not met: N/A, all goals met at this time.   Recommendation:  Patient will benefit from ongoing skilled OT services in home health setting to continue to advance functional skills in the area of BADL and iADL.  Equipment: BSC  Reasons for discharge: treatment goals met and discharge from hospital  Patient/family agrees with progress made and goals achieved: Yes  Pain Pain Assessment Pain Assessment: No/denies pain  ADL ADL Eating: Independent Where Assessed-Eating: Bed level Grooming: Independent Where Assessed-Grooming: Sitting at sink Upper Body Bathing: Supervision/safety Where Assessed-Upper Body Bathing: Sitting at sink Lower Body Bathing: Supervision/safety Where Assessed-Lower Body Bathing: Standing at sink;Sitting at sink Upper Body Dressing: Independent Where Assessed-Upper Body Dressing: Sitting at sink Lower Body Dressing: Supervision/safety Where Assessed-Lower Body Dressing: Sitting at sink;Standing at  sink Toileting: Supervision/safety Where Assessed-Toileting: Bedside Commode Toilet Transfer: Close supervision Toilet Transfer Method: Ambulating Tub/Shower Transfer: Close supervison Tub/Shower Transfer Method: Ambulating Tub/Shower Equipment: Emergency planning/management officer  Vision/Perception  Vision - History Baseline Vision: No visual deficits Visual History: Glaucoma Patient Visual Report: No change from baseline   Sensation Sensation Light Touch: Appears Intact Hot/Cold: Not tested Proprioception: Not tested Coordination Gross Motor Movements are Fluid and Coordinated: Yes Fine Motor Movements are Fluid and Coordinated: Yes  Motor  Motor Motor: Within Functional Limits  Trunk/Postural Assessment  Cervical Assessment Cervical Assessment: Within Functional Limits Thoracic Assessment Thoracic Assessment: Within Functional Limits Lumbar Assessment Lumbar Assessment: Within Functional Limits Postural Control Postural Limitations: kyphotic posture    LUE Assessment LUE Assessment: Within Functional Limits RUE Assessment RUE Assessment: Within Functional Limits  See FIM for current functional status  Rich Brave 06/18/2011, 11:14 AM

## 2011-06-18 NOTE — Progress Notes (Signed)
Physical Therapy Discharge Summary  Patient Details  Name: [name] [name] MRN: [id number] Date of Birth: [birth year]  Today's Date: 06/18/2011 Time: 1330-1400 Time Calculation (min): 30 min  Patient has met 9 of 9 long term goals due to improved activity tolerance, increased strength, and ability to compensate for deficits.  Patient to discharge at wheelchair for community and ambulatory for home level Min Assist occasionally for gait, supervision otherwise.   Patient's care partner is independent to provide the necessary physical assistance at discharge.  Reasons goals not met: NA  Recommendation:  Patient will benefit from ongoing skilled PT services in home health setting to continue to advance safe functional mobility, address ongoing impairments in functional strength, endurance, tolerance of ADLs, decreased independence with ADLs, and minimize fall risk.  Equipment: Wheelchair, RW, bedside commode  Reasons for discharge: treatment goals met, pt being discharged from the hospital  Patient/family agrees with progress made and goals achieved: Yes  PT Discharge Precautions/Restrictions Precautions Precautions: Fall Precaution Comments: c/o R knee buckling, monitor O2 Pain Pt reports no complaints of pain during treatment  Sensation Sensation Light Touch: Appears Intact Hot/Cold: Not tested Proprioception: Not tested Coordination Gross Motor Movements are Fluid and Coordinated: Yes Fine Motor Movements are Fluid and Coordinated: Yes Mobility Bed Mobility Left Sidelying to Sit: 6: Modified independent (Device/Increase time) Sitting - Scoot to Edge of Bed: 6: Modified independent (Device/Increase time) Sit to Sidelying Left: 6: Modified independent (Device/Increase time) Transfers Sit to Stand: 5: Supervision Sit to Stand Details (indicate cue type and reason): Supervision for safety only.  Stand to Sit: 5: Supervision Stand to Sit Details: Verbal cues for safety, safe  placement of equipment. Supervision for safety.  Stand Pivot Transfers: 5: Supervision Stand Pivot Transfer Details: Verbal cues for safe use of DME/AE Stand Pivot Transfer Details (indicate cue type and reason): Supervision for safety, no physical assist needed.  Locomotion  Ambulation Ambulation/Gait Assistance: 4: Min guard Ambulation Distance (Feet): 26 Feet Assistive device: Rolling walker Ambulation/Gait Assistance Details: Verbal cues for safe use of DME/AE;Verbal cues for precautions/safety;Verbal cues for technique Ambulation/Gait Assistance Details: Cues for breathing technique, posture.  Gait Gait: Yes Gait Pattern: Impaired High Level Ambulation High Level Ambulation: Side stepping, backwards walking with min assist Side Stepping: Decreased endurance with side stepping and backwards walking, pt only able to tolerate a few steps each, limited household ambulation to ~15'.  Corporate treasurer: Yes Wheelchair Assistance: 5: Supervision Wheelchair Parts Management: Independent (with locking/unlocking breaks appropriately) Distance: 20' home environment with obstacles, supervision for cues for technique and efficiency. 46' controlled environment modified independent.       RLE Assessment RLE Assessment: Exceptions to [facility] RLE AROM (degrees) Overall AROM Right Lower Extremity: Within functional limits for tasks assessed RLE Strength RLE Overall Strength Comments: grossly 4-/5, continues to be functionally very weak LLE Assessment LLE Assessment: Exceptions to [facility] LLE AROM (degrees) Overall AROM Left Lower Extremity: Within functional limits for tasks assessed LLE Strength LLE Overall Strength Comments: grossly 4-/5, continues to be functionally very weak  Treatment Session:  Gait x 26' with RW, min-guard assist. Wheelchair mobility performed, pt meeting goals. Practiced car transfer, pt performed with RW with supervision and verbal cues for sequencing  and safety. 3 transfers performed during session (bed>wheelchair>3n1>bed) all with supervision for safety. Discussed energy conservation at home with consideration that she will need to continue to increase endurance. Discussed need for 24 hour supervision right now, pt agrees and verbalize understanding and current fall risk.  Pt's SpO2 continues to decreased with increased activity. Pt on 2.5L O2 at start of treatment with SpO2 = 90% at rest. With ambulation on 3L O2 pt's SpO2 dropped to 84%. With every transfer pt consistently drops to 84% on 3L. With the last transfer which involved standing for self care after use of 3n1 then transfer back to bed pt dropped to 80% on 3L [place] O2. Pt able to resaturate to 91-93% on 3L O2 given seated rest and verbal cues for pursed lip breathing.  See FIM for current functional status  [name] [name] 06/18/2011, 5:07 PM

## 2011-06-18 NOTE — Progress Notes (Signed)
Occupational Therapy Session Note  Patient Details  Name: [name] [name] MRN: [id number] Date of Birth: 1941-01-29  Today's Date: 06/18/2011 Time: 1330-1400 Time Calculation (min): 30 min   Skilled Therapeutic Interventions/Progress Updates:  Skilled occupational therapy focusing on simple meal prep in ADL kitchen. Activity performed at w/c level. During task patient on 3 liters of 02 and sats checked throughout session and ranging from 82%-91%. When sats below 90% therapist encouraged rest break and pursed lip breathing. Also engaged in bed mobility before and after session with supervision for transfers and supervision for bed mobility.   Precautions:  Precautions Precautions: Fall Precaution Comments: c/o R knee buckling, monitor O2 Required Braces or Orthoses DO NOT USE: No Restrictions Weight Bearing Restrictions: No  See FIM for current functional status  Therapy/Group: Individual Therapy  Sylvan Lahm 06/18/2011, 2:40 PM

## 2011-06-18 NOTE — Progress Notes (Signed)
Patient ID: [name] [name], female   DOB: 1941-02-04, 71 y.o.   MRN: [id number] Subjective/Complaints: Review of Systems  Respiratory: Positive for cough and shortness of breath.   Psychiatric/Behavioral: The patient is nervous/anxious and has insomnia.   All other systems reviewed and are negative.  Slept ok, tolerating therapy, has caregiver coming for training husband has been ill  Objective: Vital Signs: Blood pressure 131/59, pulse 66, temperature 98.1 F (36.7 C), temperature source Oral, resp. rate 19, height 5\' 5"  (1.651 m), weight 74.027 kg (163 lb 3.2 oz), SpO2 98.00%. No results found. No results found for this basename: WBC:2,HGB:2,HCT:2,PLT:2 in the last 72 hours No results found for this basename: NA:2,K:2,CL:2,CO2:2,GLUCOSE:2,BUN:2,CREATININE:2,CALCIUM:2 in the last 72 hours CBG (last 3)  No results found for this basename: GLUCAP:3 in the last 72 hours  Wt Readings from Last 3 Encounters:  06/12/11 74.027 kg (163 lb 3.2 oz)  06/03/11 71 kg (156 lb 8.4 oz)  04/01/11 72.576 kg (160 lb)    Physical Exam:  General appearance: alert, cooperative and mild distress Head: Normocephalic, without obvious abnormality, atraumatic Eyes:  Ears:  Nose: Nares normal. Septum midline. Mucosa normal. No drainage or sinus tenderness. Throat: lips, mucosa, and tongue normal; teeth and gums normal Neck: no adenopathy, no carotid bruit, no JVD, supple, symmetrical, trachea midline and thyroid not enlarged, symmetric, no tenderness/mass/nodules Back: symmetric, no curvature. ROM normal. No CVA tenderness. Resp: diminished breath sounds bilaterally . Better air movement.Mild Exp wheezing. More comfortable. On 3.5L currently.  Cardio: regular rate and rhythm, S1, S2 normal, no murmur, click, rub or gallop GI: soft, non-tender; bowel sounds normal; no masses,  no organomegaly Extremities: extremities normal, atraumatic, no cyanosis or edema Pulses: 2+ and symmetric Skin: Skin color, texture,  turgor normal. No rashes or lesions Neurologic: Grossly normal generalized weakness proximal lower est predominantly.  No gross sensory findings. Cognitive deficits Incision/Wound:  Psych: remains very anxious   Assessment/Plan: 1. Functional deficits secondary to severe deconditioning related to respiratory failure which require 3+ hours per day of interdisciplinary therapy in a comprehensive inpatient rehab setting. Physiatrist is providing close team supervision and 24 hour management of active medical problems listed below. Physiatrist and rehab team continue to assess barriers to discharge/monitor patient progress toward functional and medical goals. FIM: FIM - Bathing Bathing Steps Patient Completed: Chest;Right Arm;Left Arm;Abdomen;Front perineal area;Buttocks;Right upper leg;Left upper leg Bathing: 4: Min-Patient completes 8-9 63f 10 parts or 75+ percent  FIM - Upper Body Dressing/Undressing Upper body dressing/undressing steps patient completed: Thread/unthread right sleeve of pullover shirt/dresss;Thread/unthread left sleeve of pullover shirt/dress;Put head through opening of pull over shirt/dress;Pull shirt over trunk Upper body dressing/undressing: 5: Set-up assist to: Obtain clothing/put away FIM - Lower Body Dressing/Undressing Lower body dressing/undressing steps patient completed: Thread/unthread right underwear leg;Thread/unthread left underwear leg;Pull underwear up/down;Pull pants up/down;Thread/unthread left pants leg;Thread/unthread right pants leg Lower body dressing/undressing: 4: Min-Patient completed 75 plus % of tasks  FIM - Toileting Toileting steps completed by patient: Adjust clothing prior to toileting;Performs perineal hygiene;Adjust clothing after toileting Toileting: 4: Steadying assist  FIM - Diplomatic Services operational officer Devices: Bedside commode Toilet Transfers: 4-To toilet/BSC: Min A (steadying Pt. > 75%);4-From toilet/BSC: Min A  (steadying Pt. > 75%)  FIM - Bed/Chair Transfer Bed/Chair Transfer Assistive Devices: Therapist, occupational: 5: Chair or W/C > Bed: Supervision (verbal cues/safety issues);6: Sit > Supine: No assist  FIM - Locomotion: Wheelchair Distance: 50' Locomotion: Wheelchair: 1: Travels less than 50 ft with supervision, cueing or coaxing FIM -  Locomotion: Ambulation Locomotion: Ambulation Assistive Devices: Designer, industrial/product Ambulation/Gait Assistance: 4: Min assist Locomotion: Ambulation: 1: Travels less than 50 ft with minimal assistance (Pt.>75%)  Comprehension Comprehension Mode: Auditory Comprehension: 5-Follows basic conversation/direction: With no assist  Expression Expression Mode: Verbal Expression Assistive Devices: 6-Communication board Expression: 5-Expresses basic needs/ideas: With no assist  Social Interaction Social Interaction: 5-Interacts appropriately 90% of the time - Needs monitoring or encouragement for participation or interaction.  Problem Solving Problem Solving: 5-Solves basic problems: With no assist  Memory Memory: 5-Recognizes or recalls 90% of the time/requires cueing < 10% of the time  1. DVT Prophylaxis/Anticoagulation: Mechanical: Sequential compression devices, below knee Bilateral lower extremities  2. Pain Management: will monitor for back/ knee pain with increased activity.  3. Mood: still anxious at times.  Gets anxious easily over breathing.  Needs positive reinforcement. Prn xanax. Will schedule TID doses as well. 4. COPD: acute on chronic effects. On prednisone at home restart at 10mg   -recent cxr stable  -breathing seems to be steadily improving  -lasix and increased prednisone  -nebs, ezpap per [facility]  -appreciate pulmonary help  5. PSVT: Resolved on amiodarone bid, digoxin and cardizem.  6. Frequency: ua negative, culture neg  -occasional retention  -she does have a hx of bladder CA 7. Constipation: started bowel program. Scheduled senna  at bedtime  LOS (Days) 14 A FACE TO FACE EVALUATION WAS PERFORMED  [name] [name] 06/18/2011, 7:36 AM

## 2011-06-19 MED ORDER — AMIODARONE HCL 200 MG PO TABS: 200.0000 mg | ORAL_TABLET | Freq: Two times a day (BID) | ORAL | Status: DC

## 2011-06-19 MED ORDER — GUAIFENESIN ER 600 MG PO TB12: 1200.0000 mg | ORAL_TABLET | Freq: Two times a day (BID) | ORAL | Status: DC

## 2011-06-19 MED ORDER — SENNOSIDES-DOCUSATE SODIUM 8.6-50 MG PO TABS: 2.0000 | ORAL_TABLET | Freq: Every day | ORAL | Status: DC

## 2011-06-19 MED ORDER — BUDESONIDE-FORMOTEROL FUMARATE 80-4.5 MCG/ACT IN AERO: 2.0000 | INHALATION_SPRAY | Freq: Two times a day (BID) | RESPIRATORY_TRACT | Status: DC

## 2011-06-19 MED ORDER — DILTIAZEM HCL ER COATED BEADS 300 MG PO CP24: 300.0000 mg | ORAL_CAPSULE | Freq: Every day | ORAL | Status: DC

## 2011-06-19 MED ORDER — IPRATROPIUM BROMIDE 0.02 % IN SOLN: 0.5000 mg | Freq: Four times a day (QID) | RESPIRATORY_TRACT | Status: DC

## 2011-06-19 MED ORDER — PREDNISONE 5 MG PO TABS: ORAL_TABLET | ORAL | Status: DC

## 2011-06-19 MED ORDER — LEVALBUTEROL HCL 0.63 MG/3ML IN NEBU: 1.0000 | INHALATION_SOLUTION | Freq: Four times a day (QID) | RESPIRATORY_TRACT | Status: DC

## 2011-06-19 MED ORDER — DIGOXIN 125 MCG PO TABS: 0.1250 mg | ORAL_TABLET | Freq: Every day | ORAL | Status: DC

## 2011-06-19 MED ORDER — FUROSEMIDE 20 MG PO TABS: 20.0000 mg | ORAL_TABLET | Freq: Every day | ORAL | Status: DC

## 2011-06-19 NOTE — Progress Notes (Signed)
1300 Pt. Discharged to home with family; all personal belongings in tow.  PA provided all discharge instructions via handouts and verbal explanation.  No further questions asked.

## 2011-06-19 NOTE — Progress Notes (Signed)
Patient ID: [name] [name], female   DOB: June 21, 1940, 71 y.o.   MRN: [id number] Patient ID: [name] [name], female   DOB: [birth year], 71 y.o.   MRN: [id number] Subjective/Complaints: Review of Systems  Respiratory: Positive for cough and shortness of breath.   Psychiatric/Behavioral: The patient is nervous/anxious and has insomnia.   All other systems reviewed and are negative.  Slept ok, ready to go home.   Objective: Vital Signs: Blood pressure 124/58, pulse 71, temperature 97.8 F (36.6 C), temperature source Oral, resp. rate 18, height 5\' 5"  (1.651 m), weight 74.027 kg (163 lb 3.2 oz), SpO2 95.00%. No results found. No results found for this basename: WBC:2,HGB:2,HCT:2,PLT:2 in the last 72 hours No results found for this basename: NA:2,K:2,CL:2,CO2:2,GLUCOSE:2,BUN:2,CREATININE:2,CALCIUM:2 in the last 72 hours CBG (last 3)  No results found for this basename: GLUCAP:3 in the last 72 hours  Wt Readings from Last 3 Encounters:  06/12/11 74.027 kg (163 lb 3.2 oz)  06/03/11 71 kg (156 lb 8.4 oz)  04/01/11 72.576 kg (160 lb)    Physical Exam:  General appearance: alert, cooperative and mild distress Head: Normocephalic, without obvious abnormality, atraumatic Eyes:  Ears:  Nose: Nares normal. Septum midline. Mucosa normal. No drainage or sinus tenderness. Throat: lips, mucosa, and tongue normal; teeth and gums normal Neck: no adenopathy, no carotid bruit, no JVD, supple, symmetrical, trachea midline and thyroid not enlarged, symmetric, no tenderness/mass/nodules Back: symmetric, no curvature. ROM normal. No CVA tenderness. Resp: diminished breath sounds bilaterally . Better air movement.Mild Exp wheezing. More comfortable. On 3.5L currently.  Cardio: regular rate and rhythm, S1, S2 normal, no murmur, click, rub or gallop GI: soft, non-tender; bowel sounds normal; no masses,  no organomegaly Extremities: extremities normal, atraumatic, no cyanosis or edema Pulses: 2+ and symmetric Skin:  Skin color, texture, turgor normal. No rashes or lesions Neurologic: Grossly normal generalized weakness proximal lower est predominantly.  No gross sensory findings. Cognitive deficits Incision/Wound:  Psych: remains very anxious   Assessment/Plan: 1. Functional deficits secondary to severe deconditioning related to respiratory failure which require 3+ hours per day of interdisciplinary therapy in a comprehensive inpatient rehab setting. Physiatrist is providing close team supervision and 24 hour management of active medical problems listed below. Physiatrist and rehab team continue to assess barriers to discharge/monitor patient progress toward functional and medical goals. FIM: FIM - Bathing Bathing Steps Patient Completed: Chest;Right Arm;Left Arm;Abdomen;Front perineal area;Buttocks;Right upper leg;Left upper leg;Right lower leg (including foot);Left lower leg (including foot) Bathing: 5: Supervision: Safety issues/verbal cues  FIM - Upper Body Dressing/Undressing Upper body dressing/undressing steps patient completed: Thread/unthread right sleeve of pullover shirt/dresss;Thread/unthread left sleeve of pullover shirt/dress;Put head through opening of pull over shirt/dress;Pull shirt over trunk Upper body dressing/undressing: 7: Complete Independence: No helper FIM - Lower Body Dressing/Undressing Lower body dressing/undressing steps patient completed: Thread/unthread right underwear leg;Thread/unthread left underwear leg;Thread/unthread right pants leg;Pull underwear up/down;Thread/unthread left pants leg;Pull pants up/down;Don/Doff right sock;Don/Doff left sock Lower body dressing/undressing: 5: Supervision: Safety issues/verbal cues  FIM - Toileting Toileting steps completed by patient: Adjust clothing prior to toileting;Performs perineal hygiene;Adjust clothing after toileting Toileting: 5: Supervision: Safety issues/verbal cues  FIM - Diplomatic Services operational officer  Devices: Bedside commode;Walker Toilet Transfers: 5-Set-up assist to: Apply orthosis/W/C setup;5-To toilet/BSC: Supervision (verbal cues/safety issues);5-From toilet/BSC: Supervision (verbal cues/safety issues)  FIM - Banker Devices: Therapist, occupational: 6: Supine > Sit: No assist;7: Sit > Supine: No assist;5: Bed > Chair or W/C: Supervision (verbal cues/safety issues);5: Chair  or W/C > Bed: Supervision (verbal cues/safety issues)  FIM - Locomotion: Wheelchair Distance: 50' Locomotion: Wheelchair: 2: Travels 50 - 149 ft with supervision, cueing or coaxing FIM - Locomotion: Ambulation Locomotion: Ambulation Assistive Devices: Designer, industrial/product Ambulation/Gait Assistance: 4: Min guard Locomotion: Ambulation: 1: Travels less than 50 ft with minimal assistance (Pt.>75%)  Comprehension Comprehension Mode: Auditory Comprehension: 6-Follows complex conversation/direction: With extra time/assistive device  Expression Expression Mode: Verbal Expression Assistive Devices: 6-Communication board Expression: 6-Expresses complex ideas: With extra time/assistive device  Social Interaction Social Interaction Mode: Asleep Social Interaction: 6-Interacts appropriately with others with medication or extra time (anti-anxiety, antidepressant).  Problem Solving Problem Solving Mode: Asleep Problem Solving: 5-Solves basic 90% of the time/requires cueing < 10% of the time  Memory Memory Mode: Asleep Memory: 5-Requires cues to use assistive device  1. DVT Prophylaxis/Anticoagulation: Mechanical: Sequential compression devices, below knee Bilateral lower extremities  2. Pain Management: will monitor for back/ knee pain with increased activity.  3. Mood: still anxious at times.  Needs positive reinforcement. Prn xanax. Will schedule TID doses as well.  4. COPD: acute on chronic effects.   -recent cxr stable  -breathing seems to be steadily  improving  -lasix and increased prednisone-wean steroids slowly per [facility]  -nebs and other mgt per [facility]. She has an apt with them next week.  -appreciate pulmonary help 5. PSVT: Resolved on amiodarone bid, digoxin and cardizem.  6. Frequency: ua negative, culture neg  -occasional retention  -she does have a hx of bladder CA 7. Constipation: started bowel program. Scheduled senna at bedtime  LOS (Days) 15 A FACE TO FACE EVALUATION WAS PERFORMED  Harry Shuck T 06/19/2011, 8:21 AM

## 2011-06-19 NOTE — Discharge Instructions (Signed)
Inpatient Rehab Discharge Instructions  [facility] Discharge date and time:  06/19/11  Activities/Precautions/ Functional Status: Activity: activity as tolerated Diet: cardiac diet Wound Care: none needed Functional status:  ___ No restrictions     ___ Walk up steps independently _X__ 24/7 supervision/assistance   ___ Walk up steps with assistance ___ Intermittent supervision/assistance  ___ Bathe/dress independently ___ Walk with walker     ___ Bathe/dress with assistance ___ Walk Independently    ___ Shower independently _X__ Walk with assistance    _X_ Shower with assistance ___ No alcohol     ___ Return to work/school ________  COMMUNITY REFERRALS UPON DISCHARGE:    Home Health:   PT   OT   RN                      Agency: [facility] Phone: [phone]   Medical Equipment/Items Ordered: wheelchair, cushion, rolling walker and commode                                                      Agency/Supplier: [facility] [phone]   GENERAL COMMUNITY RESOURCES FOR PATIENT/FAMILY: Support Groups:***  N/A:***  Employment Assistance:***   N/A:*** Caregiver Support:***  N/A:*** Education Assistance:***   N/A:*** Mental Health:***   N/A:***  Home Modification:***   N/A:***       Special Instructions:    My questions have been answered and I understand these instructions. I will adhere to these goals and the provided educational materials after my discharge from the hospital.  Patient/Caregiver Signature _______________________________ Date __________  Clinician Signature _______________________________________ Date __________  Please bring this form and your medication list with you to all your follow-up doctor's appointments.

## 2011-06-19 NOTE — Patient Care Conference (Signed)
Inpatient RehabilitationTeam Conference Note Date: 06/18/2011   Time: 2:21 PM    Patient Name: [name] [name]      Medical Record Number: [id number]  Date of Birth: Jun 25, 1940 Sex: Female         Room/Bed: 4010/4010-01 Payor Info: Payor: [employer] [place] [employer]IC[employer]  Plan: BLUE MEDICARE  Product Type: *No Product type*     Admitting Diagnosis: DECONDITIONED  Admit Date/Time:  06/04/2011  5:14 PM Admission Comments: No comment available   Primary Diagnosis:  Physical deconditioning Principal Problem: Physical deconditioning  Patient Active Problem List  Diagnoses Date Noted  . Dyspnea 06/17/2011  . Physical deconditioning 06/05/2011  . Anxiety disorder 05/31/2011    Class: Chronic  . SVT (supraventricular tachycardia) 05/24/2011  . Acute-on-chronic respiratory failure 05/15/2011  . Acute exacerbation of chronic obstructive pulmonary disease (COPD) 05/15/2011  . CAD 04/23/2010  . VENTRICULAR TACHYCARDIA 04/23/2010  . ABDOMINAL AORTIC ANEURYSM 12/29/2009  . TOBACCO ABUSE 08/18/2008  . HYPERLIPIDEMIA 08/17/2008  . GLAUCOMA 08/17/2008  . HYPERTENSION 08/17/2008  . SUPRAVENTRICULAR TACHYCARDIA 08/17/2008  . CEREBROVASCULAR DISEASE 08/17/2008  . COPD 08/17/2008    Expected Discharge Date: Expected Discharge Date: 06/19/11  Team Members Present: Physician: Dr. Claudette Laws Case Manager Present: Melanee Spry, RN Social Worker Present: Amada Jupiter, LCSW Nurse Present: Daryll Brod, RN PT Present: Karolee Stamps, PT OT Present: Edwin Cap, Felipa Eth, OT Other (Discipline and Name): Tora Duck, PPS Coordinator     Current Status/Progress Goal Weekly Team Focus  Medical   O2 saturations have remained within range, patient maintained on 3.5 L of oxygen  Avoid going up on oxygen 2 to possible hypercarbia  Continue current management for both respiratory status and anxiety   Bowel/Bladder   Incontinent of bowel at times. Continent of bladder.  Continent of bowel and  bladder      Swallow/Nutrition/ Hydration             ADL's   supervision overall  supervision overall  activity tolerance   Mobility   supervision transfers to min assist when fatigued, supervision wc 25' distances.  min assist gait and wc 25'  activity tolerance and safety awareness   Communication             Safety/Cognition/ Behavioral Observations            Pain   tylenol 325mg  prn for pain q4h  pain <2      Skin   bruising to bilateral arms and abdomen  No additional skin breakdown         *See Interdisciplinary Assessment and Plan and progress notes for long and short-term goals  Barriers to Discharge: Endurance as well as patient anxiety    Possible Resolutions to Barriers:  Continued family training    Discharge Planning/Teaching Needs:  home with husband who can provide 24/7 assistance; also has pvt duty caregiver as well      Team Discussion: Family ed completed.  Pt is ready for d/c.   Revisions to Treatment Plan: none    Continued Need for Acute Rehabilitation Level of Care: The patient requires daily medical management by a physician with specialized training in physical medicine and rehabilitation for the following conditions: Daily direction of a multidisciplinary physical rehabilitation program to ensure safe treatment while eliciting the highest outcome that is of practical value to the patient.: Yes Daily medical management of patient stability for increased activity during participation in an intensive rehabilitation regime.: Yes Daily analysis of laboratory  values and/or radiology reports with any subsequent need for medication adjustment of medical intervention for : Pulmonary problems;Other  Brock Ra 06/19/2011, 12:21 PM

## 2011-06-19 NOTE — Progress Notes (Signed)
Social Work  Discharge Note  The overall goal for the admission was met for:   Discharge location: Yes - home with husband and pvt caregiver  Length of Stay: Yes - 13 days  Discharge activity level: Yes - minimal assistance overall  Home/community participation: Yes  Services provided included: MD, RD, PT, OT, RN, CM, TR, Pharmacy and SW  Financial Services: Medicare  Follow-up services arranged: Home Health: RN, PT, OT via [facility], DME: 16x16 [employer] w/c, cushion, rw, 3n1 commode - [facility] and Patient/Family has no preference for HH/DME agencies  Comments (or additional information):  Patient/Family verbalized understanding of follow-up arrangements: Yes  Individual responsible for coordination of the follow-up plan: patient  Confirmed correct DME delivered: [name] [name] 06/19/2011    Domnique Vantine

## 2011-06-21 ENCOUNTER — Telehealth: Payer: Self-pay | Admitting: Physical Medicine & Rehabilitation

## 2011-06-21 MED ORDER — DIGOXIN 125 MCG PO TABS: 0.1250 mg | ORAL_TABLET | Freq: Every day | ORAL | Status: DC

## 2011-06-21 NOTE — Telephone Encounter (Signed)
Verbal order given to Clydie Braun. Digoxin was sent in for pt to [facility] pharmacy but from the looks in chart we weren't the ones who gave her Xanax.

## 2011-06-21 NOTE — Telephone Encounter (Signed)
Patient did not receive all meds when discharged from hospital, Degoxin and Xanax.  [name] [name] [name] [phone].  Also need referral for [name] aide-verbal ok.

## 2011-06-24 ENCOUNTER — Encounter: Payer: Self-pay | Admitting: Adult Health

## 2011-06-24 ENCOUNTER — Inpatient Hospital Stay: Payer: Medicare Other | Admitting: Adult Health

## 2011-06-24 ENCOUNTER — Ambulatory Visit (INDEPENDENT_AMBULATORY_CARE_PROVIDER_SITE_OTHER): Payer: Medicare Other | Admitting: Adult Health

## 2011-06-24 ENCOUNTER — Ambulatory Visit (INDEPENDENT_AMBULATORY_CARE_PROVIDER_SITE_OTHER)
Admission: RE | Admit: 2011-06-24 | Discharge: 2011-06-24 | Disposition: A | Discharge status: Home / Self Care | Payer: Medicare Other | Source: Ambulatory Visit | Attending: Adult Health | Admitting: Adult Health

## 2011-06-24 VITALS — BP 104/64 | HR 96 | Temp 97.3°F | Wt 148.8 lb

## 2011-06-24 DIAGNOSIS — I498 Other specified cardiac arrhythmias: Secondary | ICD-10-CM

## 2011-06-24 DIAGNOSIS — J441 Chronic obstructive pulmonary disease with (acute) exacerbation: Secondary | ICD-10-CM

## 2011-06-24 DIAGNOSIS — I471 Supraventricular tachycardia: Secondary | ICD-10-CM

## 2011-06-24 DIAGNOSIS — I2510 Atherosclerotic heart disease of native coronary artery without angina pectoris: Secondary | ICD-10-CM

## 2011-06-24 MED ORDER — LEVALBUTEROL HCL 0.63 MG/3ML IN NEBU: 1.0000 | INHALATION_SOLUTION | RESPIRATORY_TRACT | Status: DC | PRN

## 2011-06-24 NOTE — Progress Notes (Signed)
Subjective:    Patient ID: [name] [name], female    DOB: 1941-01-28, 71 y.o.   MRN: [id number]  HPI 80 yowf active smoker referred to pulmonary by Dr Jens Som for preop eval of copd previously GOLD III/IV in 2008 and needs L CEA (initial pulmonary consult 01/25/10)   January 25, 2010 1st pulmonary office eval  worse doe x 3 weeks to point x 50 ft assoc with feeling light headed on benicar and smoking, not using 02.    06/24/2011 Post Hospital follow up  Last seen in office 01/2010.  She returns today for a post hospital follow up . She is  COPD-oxygen dependant 2.5 liters, admitted 05/15/11 for acute resp distress. She had a prolonged hospitalization/critical illness for acute on chronic resp failure secondary to COPD exacerbation w/ initial failure of BIPAP requiring intubation , extubated on 3/22. She required aggressive pulmonary hygiene, IV ABX and Steroids. Stay was complicated by PVST requiring med management. She had a  slow recovery with discharge to rehab on 06/04/11 with deconditioning and anxiety. Required high flow O2 with 5 l rest and 6 l with activity.   Since discharge she is feeling better , breathing back to her baseline.  She is now at home , discharged from IP rehab on 06/19/11.  Currently on Symbicort Twice daily.  Taking Atrovent NEB Twice daily   Taking xopenex neb Twice daily   No smoking since discharge.  On steroid taper, has few days left.  Now on 3 l/m continuously .  Has home health nursing at home  Denies any hemoptysis, chest pain, orthopnea, PND, or leg swelling     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)  HYPERLIPIDEMIA (ICD-272.4)  HYPERTENSION (ICD-401.9)  CEREBROVASCULAR DISEASE (ICD-437.9)  - 80% R Carotid stenosis - see note by Bradham11/16/11  GLAUCOMA (ICD-365.9)  OBESITY (ICD-278.00)  COPD (ICD-496)  - PFT's [id number] FEV1 .85(37%) ratio 47 and 11 % better p B2, DLC0 42%  - Walking sats January 25, 2010 > desat even on 2lpm  > rec 2.5 lpm 24 h per day     Review of Systems Constitutional:   No  weight loss, night sweats,  Fevers, chills,  +fatigue, or  lassitude.  HEENT:   No headaches,  Difficulty swallowing,  Tooth/dental problems, or  Sore throat,                No sneezing, itching, ear ache, nasal congestion, post nasal drip,   CV:  No chest pain,  Orthopnea, PND, swelling in lower extremities, anasarca, dizziness, palpitations, syncope.   GI  No heartburn, indigestion, abdominal pain, nausea, vomiting, diarrhea, change in bowel habits, loss of appetite, bloody stools.   Resp:    No coughing up of blood.   Marland Kitchen  No chest wall deformity  Skin: no rash or lesions.  GU: no dysuria, change in color of urine, no urgency or frequency.  No flank pain, no hematuria   MS:  No joint pain or swelling.  No decreased range of motion.  No back pain.  Psych:  No change in mood or affect. No depression or anxiety.  No memory loss.         Objective:   Physical Exam GEN: A/Ox3; pleasant , NAD, chronically ill appearing   HEENT:  [place]/AT,  EACs-clear, TMs-wnl, NOSE-clear, THROAT-clear, no lesions, no postnasal drip or exudate noted.   NECK:  Supple w/ fair ROM; no JVD; normal carotid impulses w/o bruits; no thyromegaly or nodules palpated;  no lymphadenopathy.  RESP  Coarse BS  w/o, wheezes/ rales/ or rhonchi.no accessory muscle use, no dullness to percussion  CARD:  RRR, no m/r/g  , no peripheral edema, pulses intact, no cyanosis or clubbing.  GI:   Soft & nt; nml bowel sounds; no organomegaly or masses detected.  Musco: Warm bil, no deformities or joint swelling noted.   Neuro: alert, no focal deficits noted.    Skin: Warm, no lesions or rashes         Assessment & Plan:

## 2011-06-24 NOTE — Assessment & Plan Note (Signed)
Set up followup with cardiology

## 2011-06-24 NOTE — Assessment & Plan Note (Signed)
Recent acute COPD, exacerbation with prolonged critical illness with acute on chronic respiratory failure, now resolved. Patient is congratulated on smoking cessation. She will change her Xopenex over to an as needed. Use, only. Continue on Symbicort 2 puffs twice daily. He increase Atrovent nebulizer to 4 times daily. She will call back here in the office in 3 weeks and as needed. Chest x-ray is pending

## 2011-06-24 NOTE — Patient Instructions (Signed)
Continue on Symbicort 2 puffs twice daily, brush rinse and gargle after use. Increase ipratropium nebulizer 4 times daily. Change Xopenex nebulizer to as needed only for shortness of breath and wheezing. We are very proud of you for quitting smoking. Keep up the good work. We will change your cardiology appointment to Dr. Jens Som as requested I will call with x-ray results Followup with Dr. Sherene Sires in 3 weeks and as needed

## 2011-06-26 NOTE — Progress Notes (Signed)
Quick Note:  Spoke with pt and notified of results per Tammt Parrett. Pt verbalized understanding and denied any questions.  ______

## 2011-06-27 ENCOUNTER — Telehealth: Payer: Self-pay | Admitting: Internal Medicine

## 2011-06-27 NOTE — Telephone Encounter (Signed)
I spoke with pt and is aware of MW recs. She voiced her understanding and needed nothing further.  

## 2011-06-27 NOTE — Telephone Encounter (Signed)
If she has an oximeter it's fine with me to adjust the flow down to sats in low 90's

## 2011-06-27 NOTE — Telephone Encounter (Signed)
Spoke with pt. She states that DME cleaned her "filters" and changed her o2 tubing yesterday and now she feels like the 3 lpm is too much. Her sats are between 95 and 98% and I explained that this is good, but her complaint is that it is making her nose feel dry and "like it's burning". I advised that she may need to try using some saline nasal gel for this. She already has humidifier with o2. Will forward to MW for further recs. Please advise, thanks!

## 2011-07-01 NOTE — Discharge Summary (Signed)
NAMENINNIE, FEIN NO.:  [id number]  MEDICAL RECORD NO.:  [id number]  LOCATION:  4010                         FACILITY:  [facility]  PHYSICIAN:  Ranelle Oyster, M.D.DATE OF BIRTH:  1940-05-11  DATE OF ADMISSION:  06/04/2011 DATE OF DISCHARGE:  06/19/2011                              DISCHARGE SUMMARY   DISCHARGE DIAGNOSES: 1. Deconditioning due to respiratory failure from acute exacerbation     of chronic obstructive pulmonary disease. 2. High levels of anxiety. 3. Paroxysmal supraventricular tachycardia. 4. Frequency. 5. Constipation.  HISTORY OF PRESENT ILLNESS:  Ms. [name] [name] is a 71 year old female with COPD, O2 dependent, who was admitted on June 04, 2011 with acute exacerbation of COPD in setting of possible CAP.  She required intubation, treatments with IV steroids as well as IV antibiotics.  She was extubated on May 17, 2011, and was noted to have issues with encephalopathy, anxiety as well as PSVT.  She was started on diltiazem and amiodarone for rate control.  Therapies initiated and the patient was noted to have problems with deconditioning.  Rehab evaluated the patient and we felt that she would benefit from inpatient rehab stay.  PAST MEDICAL HISTORY: 1. Hypertension. 2. Dyslipidemia. 3. CVA. 4. Glaucoma. 5. Obesity. 6. COPD. 7. Coronary artery disease. 8. SVT. 9. AAA.  FUNCTIONAL HISTORY:  The patient was independent prior to admission. She did not use an Adaptic device.  She was able to do light housekeeping as well as drive.  FUNCTIONAL STATUS:  The patient is min-assist for bed mobility, min- assist for sit-to-stand transfers.  Noted to have poor posture and no ambulation yet.  She required setup for upper body care, mod-assist for lower body care, mod-assist for toileting.  HOSPITAL COURSE:  Ms. [name] [name] was admitted to Rehab on June 04, 2011, for inpatient therapies to consist of PT, OT at least 3 hours 5 days a week.   Past-admission, physiatrist, rehab RN, and therapy team have worked together to provide customized collaborative interdisciplinary care.  Rehab RN has worked with the patient on bowel and bladder program as well as wound care monitoring.  The patient was advised to continue incentive spirometry every q. hour while awake.  She was noted to have high levels of anxiety, require a ego support by the whole team as well as with p.r.n. use of anxiolytics.  She was started on a bowel program to help with her constipation issues.  She continued to require high levels of O2 during this stay.  ABG was checked on June 18, 2011 on 2.5 L revealing PCO2 at 58.5, PO2 at 65.7, bicarb at 38.1, pH at 7.43. Check of lytes from June 10, 2011, revealed sodium 139, potassium 3.7, chloride 95, CO2 35, BUN 13, creatinine 0.43, glucose 89.  CBC done revealed hemoglobin 10.8, hematocrit 33.5, white count 8.0, platelets 164.  Followup chest x-rays were done during this stay due to complaints of shortness of breath.  Last chest x-ray of June 09, 2011, showed bilateral pleural effusions, bibasilar atelectasis.  Pulmonary was contacted for input on the patient's dyspnea.  They felt this was lastly due to debilitation and weakness.  They recommended supplementing O2 with titration carefully to maintain sats greater than 88% and levalbuterol and budesonide with EzPAP, aggressive pulmonary hygiene and slow wean of prednisone.  During the patient's stay in Rehab, weekly team conferences were held to monitor the patient's progress, set goals, as well as discuss barriers to discharge.  PT/OT has worked with the patient on activity tolerance, balance, postural control as well as improving her ability to compensate for deficits.  She was educated regarding energy, conservation strategies during functional tasks.  The patient progressed to being at supervision level for bathing, lower body dressing, tub transfers,  and toileting.  She is independent for grooming and upper body dressing. Husband and caregiver were educated about providing appropriate assistance at home.  The patient progressed to being at supervision level for overall mobility.  She will occasionally required min-assist for gait and wheelchair level mobility for community setting.  Further followup home health therapies to continue past discharge.  On June 18, 2011, the patient is discharged to home.  DISCHARGE MEDICATIONS: 1. Lasix 20 mg a day. 2. Mucinex 1200 mg b.i.d. 3. Albuterol nebs q.6 hours. 4. Prednisone 5 mg 2 pills p.o. per day for 3 days, then decrease to 1     pill p.o. per day x3 days and 1/2 per day until gone. 5. Senna-S 2 p.o. at bedtime. 6. Amiodarone 200 mg p.o. b.i.d. 7. Tylenol No.3 1 p.o. t.i.d. 8. Xanax 0.25 mg t.i.d. p.r.n. anxiety. 9. Coated aspirin 81 mg a day. 10.Lipitor 40 mg per day. 11.Cardizem CD 300 mg a day. 12.Nutritional supplements t.i.d. 13.Imdur 30 mg per day. 14.Nitroglycerin sublingual p.r.n. chest pain. 15.Zofran 4 mg p.r.n. 16.O2 4-5 L per nasal cannula p.r.n. 17.Plavix 75 mg a day. 18.Travatan 0.004% 1 gtt both eyes at bedtime.  DIET:  Cardiac diet.  ACTIVITY LEVEL:  As tolerated with 24-hour supervision assistance.  SPECIAL INSTRUCTIONS:  [facility] to provide PT, OT, and RN.  FOLLOWUP:  The patient to follow up with Dr. Joette Catching on Jul 01, 2011, at 3 p.m.  Follow up with Dr. Charlton Haws on Jul 05, 2011, at 10:45 to discuss tapering of amiodarone.  Follow up with Rubye Oaks, nurse practitioner on June 24, 2011, at 1145.  Follow up with Dr. Riley Kill as needed.     [name] [name], P.A.   ______________________________ Ranelle Oyster, M.D.    PL/MEDQ  D:  07/01/2011  T:  07/01/2011  Job:  [id number]  cc:   Delaney Meigs, M.D. Noralyn Pick. Eden Emms, MD, [facility] Rubye Oaks, NP

## 2011-07-01 NOTE — Progress Notes (Signed)
Discharge summary [phone]

## 2011-07-05 ENCOUNTER — Encounter: Payer: Medicare Other | Admitting: Cardiovascular Disease

## 2011-07-15 ENCOUNTER — Ambulatory Visit: Payer: Medicare Other | Admitting: Internal Medicine

## 2011-07-17 ENCOUNTER — Ambulatory Visit (INDEPENDENT_AMBULATORY_CARE_PROVIDER_SITE_OTHER): Payer: Medicare Other | Admitting: Cardiology

## 2011-07-17 ENCOUNTER — Encounter: Payer: Self-pay | Admitting: Cardiology

## 2011-07-17 VITALS — BP 144/82 | HR 85 | Ht 65.0 in | Wt 154.0 lb

## 2011-07-17 DIAGNOSIS — I714 Abdominal aortic aneurysm, without rupture: Secondary | ICD-10-CM

## 2011-07-17 DIAGNOSIS — I4891 Unspecified atrial fibrillation: Secondary | ICD-10-CM

## 2011-07-17 DIAGNOSIS — I679 Cerebrovascular disease, unspecified: Secondary | ICD-10-CM

## 2011-07-17 DIAGNOSIS — I498 Other specified cardiac arrhythmias: Secondary | ICD-10-CM

## 2011-07-17 DIAGNOSIS — I10 Essential (primary) hypertension: Secondary | ICD-10-CM

## 2011-07-17 DIAGNOSIS — I471 Supraventricular tachycardia: Secondary | ICD-10-CM

## 2011-07-17 DIAGNOSIS — I2510 Atherosclerotic heart disease of native coronary artery without angina pectoris: Secondary | ICD-10-CM

## 2011-07-17 DIAGNOSIS — E785 Hyperlipidemia, unspecified: Secondary | ICD-10-CM

## 2011-07-17 LAB — HEPATIC FUNCTION PANEL
ALT: 34 U/L (ref 0–35)
Albumin: 3.8 g/dL (ref 3.5–5.2)
Bilirubin, Direct: 0 mg/dL (ref 0.0–0.3)
Total Protein: 6.5 g/dL (ref 6.0–8.3)

## 2011-07-17 LAB — BASIC METABOLIC PANEL
CO2: 28 mEq/L (ref 19–32)
Calcium: 9.1 mg/dL (ref 8.4–10.5)
Chloride: 104 mEq/L (ref 96–112)
Creatinine, Ser: 0.8 mg/dL (ref 0.4–1.2)
Glucose, Bld: 96 mg/dL (ref 70–99)

## 2011-07-17 MED ORDER — AMIODARONE HCL 200 MG PO TABS: 200.0000 mg | ORAL_TABLET | Freq: Every day | ORAL | Status: DC

## 2011-07-17 NOTE — Progress Notes (Signed)
HPI: Pleasant female with a history of CAD, hypertension, hyperlipidemia, SVT, AAA, COPD, bladder cancer and cerebrovascular disease. She presented to [facility] in early February 2012 for a COPD exacerbation and possible diastolic heart failure. She was noted to have wide complex tachycardia that was felt to be ventricular tachycardia and she was transferred to [facility]. Cardiac catheterization on 04/06/10 demonstrated a totally occluded LAD with right to left collaterals, a nodular density in the prox CFX that occupied about 70% of the prox vessel, and preserved LVF. It was felt that her CAD should be treated medically. She was evaluated by Dr. Ladona Ridgel. It was felt that her ventricular tachycardia would also be treated medically with adjustments in her beta blocker therapy. Ultrasound 6/12 revealed 3.6 by 3.6 cm AAA; fu one year. The patient has previous attempt at carotid stent on the left. The procedure was unsuccessful due to anatomy. Patient had an episode of acute respiratory failure in March of 2013. She had SVT during that admission which was treated with amiodarone. Repeat echocardiogram in March of 2013 showed normal LV function. There was trace aortic insufficiency and mild left atrial enlargement. Since she was [facility], she has not had chest pain, palpitations or syncope. She has some dyspnea on exertion.   Current Outpatient Prescriptions  Medication Sig Dispense Refill  . acetaminophen-codeine (TYLENOL #3) 300-30 MG per tablet Take 1 tablet by mouth 3 (three) times daily.       Marland Kitchen amiodarone (PACERONE) 200 MG tablet Take 1 tablet (200 mg total) by mouth 2 (two) times daily.  60 tablet  1  . aspirin 81 MG chewable tablet Chew 1 tablet (81 mg total) by mouth daily.      Marland Kitchen atorvastatin (LIPITOR) 80 MG tablet Take 40 mg by mouth daily.       . budesonide-formoterol (SYMBICORT) 160-4.5 MCG/ACT inhaler Inhale 2 puffs into the lungs 2 (two) times daily.      . digoxin (LANOXIN)  0.125 MG tablet Take 1 tablet (0.125 mg total) by mouth daily.  30 tablet  1  . diltiazem (CARDIZEM CD) 300 MG 24 hr capsule Take 1 capsule (300 mg total) by mouth daily.  30 capsule  1  . furosemide (LASIX) 20 MG tablet Take 1 tablet (20 mg total) by mouth daily.  30 tablet  1  . guaiFENesin (MUCINEX) 600 MG 12 hr tablet Take 2 tablets (1,200 mg total) by mouth 2 (two) times daily.  120 tablet  1  . ipratropium (ATROVENT) 0.02 % nebulizer solution Take 2.5 mLs (0.5 mg total) by nebulization every 6 (six) hours.  75 mL  1  . isosorbide mononitrate (IMDUR) 30 MG 24 hr tablet Take 1 tablet (30 mg total) by mouth daily.      Marland Kitchen levalbuterol (XOPENEX) 0.63 MG/3ML nebulizer solution Take 3 mLs (0.63 mg total) by nebulization every 4 (four) hours as needed for wheezing.  120 mL  1  . nitroGLYCERIN (NITROSTAT) 0.4 MG SL tablet Place 0.4 mg under the tongue every 5 (five) minutes as needed. Chest pain      . ondansetron (ZOFRAN) 4 MG/2ML SOLN injection Inject 2 mLs (4 mg total) into the vein every 6 (six) hours as needed for nausea or vomiting.  2 mL    . Oxygen-Helium 20-80 % KIT Inhale 5 L/min into the lungs continuous. 5 liters, increase to 6 with activity      . PLAVIX 75 MG tablet Take 1 tablet by mouth Daily.      Marland Kitchen  potassium chloride SA (K-DUR,KLOR-CON) 20 MEQ tablet Take 20 mEq by mouth 2 (two) times daily.      Marland Kitchen senna-docusate (SENOKOT-S) 8.6-50 MG per tablet Take 2 tablets by mouth at bedtime.      . travoprost, benzalkonium, (TRAVATAN) 0.004 % ophthalmic solution Place 1 drop into both eyes at bedtime.           Past Medical History  Diagnosis Date  . Hypertension   . Hyperlipidemia   . Cerebrovascular disease, unspecified   . Unspecified glaucoma   . Obesity, unspecified   . COPD (chronic obstructive pulmonary disease)   . Coronary artery disease     cath 04/06/10: LAD occluded with R-L collats; med Rx WTC....probable V. Tach...med Rx  . Ventricular tachycardia   . SVT  (supraventricular tachycardia)   . AAA (abdominal aortic aneurysm)     Past Surgical History  Procedure Date  . Cystoscopy   . Bladder tumor excision     resection of 5 bladder, and fulguration of 1 small bladder tumor  . Cystoscopy     cold cup bladder biopsy of five tumors, and fulguration of one tumor  . Lung surgery     Remote right lung  . Tubal ligation     Bilateral    History   Social History  . Marital Status: Married    Spouse Name: N/A    Number of Children: 2  . Years of Education: N/A   Occupational History  . Retired     Social History Main Topics  . Smoking status: Former Smoker -- 0.3 packs/day for 58 years    Types: Cigarettes    Quit date: 05/18/2011  . Smokeless tobacco: Never Used  . Alcohol Use: No  . Drug Use: No  . Sexually Active: No   Other Topics Concern  . Not on file   Social History Narrative   Lives in [place], [place] with husband.     ROS: no fevers or chills, productive cough, hemoptysis, dysphasia, odynophagia, melena, hematochezia, dysuria, hematuria, rash, seizure activity, orthopnea, PND, pedal edema, claudication. Remaining systems are negative.  Physical Exam: Well-developed chronically ill appearing in no acute distress.  Skin is warm and dry. Eccyhmosis noted HEENT is normal.  Neck is supple.  Chest diminished BS throughout Cardiovascular exam is regular rate and rhythm.  Abdominal exam nontender or distended. No masses palpated. Extremities show no edema. neuro grossly intact  ECG sinus rhythm at a rate of 92. Normal axis. Nonspecific ST changes.

## 2011-07-17 NOTE — Assessment & Plan Note (Addendum)
Continued amiodarone but decrease dose to 200 mg daily. She was having some nausea. Hopefully this will improve her symptoms. If not we will consider discontinuing. Check liver functions and TSH. Discontinue digoxin.

## 2011-07-17 NOTE — Assessment & Plan Note (Signed)
Continue statin. 

## 2011-07-17 NOTE — Assessment & Plan Note (Signed)
Continue aspirin and statin. 

## 2011-07-17 NOTE — Assessment & Plan Note (Signed)
followup of abdominal ultrasound June 2013.

## 2011-07-17 NOTE — Assessment & Plan Note (Signed)
Continue aspirin, Plavix and statin. Followed by vascular surgery. 

## 2011-07-17 NOTE — Assessment & Plan Note (Signed)
Blood pressure controlled. Continue present medications. Check potassium and renal function. 

## 2011-07-17 NOTE — Assessment & Plan Note (Signed)
Patient now on amiodarone.

## 2011-07-17 NOTE — Patient Instructions (Addendum)
Your physician wants you to follow-up in: 6 MONTHS WITH DR Jens Som You will receive a reminder letter in the mail two months in advance. If you don't receive a letter, please call our office to schedule the follow-up appointment.   STOP DIGOXIN  DECREASE AMIODARONE TO 200 MG ONCE DAILY  Your physician recommends that you HAVE LAB WORK TODAY

## 2011-07-17 NOTE — Assessment & Plan Note (Signed)
Patient has discontinued. 

## 2011-07-18 ENCOUNTER — Encounter: Payer: Self-pay | Admitting: Internal Medicine

## 2011-07-18 ENCOUNTER — Ambulatory Visit (INDEPENDENT_AMBULATORY_CARE_PROVIDER_SITE_OTHER): Payer: Medicare Other | Admitting: Internal Medicine

## 2011-07-18 VITALS — BP 122/72 | HR 82 | Temp 97.5°F | Ht 65.0 in | Wt 152.8 lb

## 2011-07-18 DIAGNOSIS — J9622 Acute and chronic respiratory failure with hypercapnia: Secondary | ICD-10-CM | POA: Insufficient documentation

## 2011-07-18 DIAGNOSIS — J9610 Chronic respiratory failure, unspecified whether with hypoxia or hypercapnia: Secondary | ICD-10-CM

## 2011-07-18 DIAGNOSIS — J4489 Other specified chronic obstructive pulmonary disease: Secondary | ICD-10-CM

## 2011-07-18 DIAGNOSIS — J449 Chronic obstructive pulmonary disease, unspecified: Secondary | ICD-10-CM

## 2011-07-18 NOTE — Assessment & Plan Note (Signed)
Adequate control on present rx, reviewed goals to maintain sats low 90s

## 2011-07-18 NOTE — Progress Notes (Signed)
Subjective:    Patient ID: [name] [name], female    DOB: 24-Jun-1940    MRN: [id number]  Brief patient profile:  49 yowf active smoker referred to pulmonary by Dr [name] [name] for preop eval of copd previously GOLD III/IV in 2008 and needs L CEA (initial pulmonary consult 01/25/10) .   HPI January 25, 2010 1st pulmonary office eval  worse doe x 3 weeks to point x 50 ft assoc with feeling light headed on benicar and smoking, not using 02.    06/24/2011 Post Hospital follow up  Last seen in office 01/2010.  She returns today for a post hospital follow up . She is  COPD-oxygen dependant 2.5 liters, admitted 05/15/11 for acute resp distress. She had a prolonged hospitalization/critical illness for acute on chronic resp failure secondary to COPD exacerbation w/ initial failure of BIPAP requiring intubation , extubated on 3/22. She required aggressive pulmonary hygiene, IV ABX and Steroids. Stay was complicated by PVST requiring med management. She had a  slow recovery with discharge to rehab on 06/04/11 with deconditioning and anxiety. Required high flow O2 with 5 l rest and 6 l with activity.   Since discharge she is feeling better , breathing back to her baseline.  She is now at home , discharged from IP rehab on 06/19/11.  Currently on Symbicort Twice daily.  Taking Atrovent NEB Twice daily   Taking xopenex neb Twice daily   No smoking since discharge.  On steroid taper, has few days left.  Now on 3 l/m continuously .  Has home health nursing at home  Denies any hemoptysis, chest pain, orthopnea, PND, or leg swelling rec Continue on Symbicort 2 puffs twice daily, brush rinse and gargle after use. Increase ipratropium nebulizer 4 times daily. Change Xopenex nebulizer to as needed only for shortness of breath and wheezing. We are very proud of you for quitting smoking. Keep up the good work. We will change your cardiology appointment to Dr. Jens [name] as requested  07/18/2011 f/u ov/[name] [name] cc w/c bound due  to weakness and 02 3lpm 24 hours per day. No cough, confused with meds/ names (only used saba so far on day of ov, not yet gotten around to symbicort) but seems to be getting along ok at this point. No overt hb or sinus complaints or purulent sputum.  Sleeping ok without nocturnal  or early am exacerbation  of respiratory  c/o's or need for noct saba. Also denies any obvious fluctuation of symptoms with weather or environmental changes or other aggravating or alleviating factors except as outlined above        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)  HYPERLIPIDEMIA (ICD-272.4)  HYPERTENSION (ICD-401.9)  CEREBROVASCULAR DISEASE (ICD-437.9)  - 80% R Carotid stenosis - see note by Bradham11/16/11  GLAUCOMA (ICD-365.9)  OBESITY (ICD-278.00)  COPD (ICD-496)  - PFT's [id number] FEV1 .85(37%) ratio 47 and 11 % better p B2, DLC0 42%  - Walking sats January 25, 2010 > desat even on 2lpm > rec 2.5 lpm 24 h per day              Objective:   Physical Exam GEN: A/Ox3; pleasant , NAD, chronically ill appearing   Wt Readings from Last 3 Encounters:  07/18/11 152 lb 12.8 oz (69.31 kg)  07/17/11 154 lb (69.854 kg)  06/24/11 148 lb 12.8 oz (67.495 kg)     HEENT:  [place]/AT,  EACs-clear, TMs-wnl, NOSE-clear, THROAT-clear, no lesions, no postnasal drip or exudate noted.   NECK:  Supple w/ fair ROM; no JVD; normal carotid impulses w/o bruits; no thyromegaly or nodules palpated; no lymphadenopathy.  RESP  Coarse BS  w/o, wheezes/ rales/ or rhonchi.no accessory muscle use, no dullness to percussion  CARD:  RRR, no m/r/g  , no peripheral edema, pulses intact, no cyanosis or clubbing.  GI:   Soft & nt; nml bowel sounds; no organomegaly or masses detected.  Musco: Warm bil, no deformities or joint swelling noted.   Neuro: alert, no focal deficits noted.    Skin: Warm, no lesions or rashes   cxr 06/24/11 1. Chronic scarring along the right middle lobe and right lower    lobe with obscuration of the right hemidiaphragm and part of the  right heart border. There is some vague opacity just above this,  probably due to adjacent underlying scarring and rib deformities.  2. Increased left infrahilar indistinct density, of uncertain  significance. Some of this may be due to mild rightward rotation  exaggerating the already somewhat prominent left pulmonary vascular  structures. The appearance is much less striking on the lateral  projection       Assessment & Plan:

## 2011-07-18 NOTE — Assessment & Plan Note (Signed)
DDX of  difficult airways managment all start with A and  include Adherence, Ace Inhibitors, Acid Reflux, Active Sinus Disease, Alpha 1 Antitripsin deficiency, Anxiety masquerading as Airways dz,  ABPA,  allergy(esp in young), Aspiration (esp in elderly), Adverse effects of DPI,  Active smokers, plus two Bs  = Bronchiectasis and Beta blocker use..and one C= CHF  Adherence is always the initial "prime suspect" and is a multilayered concern that requires a "trust but verify" approach in every patient - starting with knowing how to use medications, especially inhalers, correctly, keeping up with refills and understanding the fundamental difference between maintenance and prns vs those medications only taken for a very short course and then stopped and not refilled. The proper method of use, as well as anticipated side effects, of a metered-dose inhaler are discussed and demonstrated to the patient. Improved effectiveness after extensive coaching during this visit to a level of approximately  Improved to 75%  Really struggling with concept of med reconciliation.   To keep things simple, I have asked the patient to first separate medicines that are perceived as maintenance, that is to be taken daily "no matter what", from those medicines that are taken on only on an as-needed basis and I have given the patient examples of both, and then return to see our NP to generate a  detailed  medication calendar which should be followed until the next physician sees the patient and updates it.

## 2011-07-18 NOTE — Patient Instructions (Addendum)
Symbicort Take 2 puffs first thing in am and then another 2 puffs about 12 hours later.    Work on inhaler technique:  relax and gently blow all the way out then take a nice smooth deep breath back in, triggering the inhaler at same time you start breathing in.  Hold for up to 5 seconds if you can.  Rinse and gargle with water when done   If your mouth or throat starts to bother you,   I suggest you time the inhaler to your dental care and after using the inhaler(s) brush teeth and tongue with a baking soda containing toothpaste and when you rinse this out, gargle with it first to see if this helps your mouth and throat.     Only use your albuterol as a rescue medication to be used if you can't catch your breath by resting or doing a relaxed purse lip breathing pattern. The less you use it, the better it will work when you need it. If you need to you can use up to every 4 hours  Ok to reduce to 02 to 1 lpm with goal if keeping oxygen above 90% most of the time.   See Tammy NP w/in 2 weeks with all your medications, even over the counter meds, separated in two separate bags, the ones you take no matter what vs the ones you stop once you feel better and take only as needed when you feel you need them.   Tammy  will generate for you a new user friendly medication calendar that will put [place] all on the same page re: your medication use.     Without this process, it simply isn't possible to assure that we are providing  your outpatient care  with  the attention to detail we feel you deserve.   If we cannot assure that you're getting that kind of care,  then we cannot manage your problem effectively from this clinic.  Once you have seen Tammy and we are sure that we're all on the same page with your medication use she will arrange follow up with me.

## 2011-08-01 ENCOUNTER — Other Ambulatory Visit: Payer: Self-pay | Admitting: Cardiology

## 2011-08-01 LAB — BASIC METABOLIC PANEL
BUN: 11 mg/dL (ref 6–23)
Calcium: 9 mg/dL (ref 8.4–10.5)
Glucose, Bld: 83 mg/dL (ref 70–99)
Potassium: 3.5 mEq/L (ref 3.5–5.3)
Sodium: 141 mEq/L (ref 135–145)

## 2011-08-07 ENCOUNTER — Encounter: Payer: Medicare Other | Admitting: Adult Health

## 2011-08-15 ENCOUNTER — Encounter (INDEPENDENT_AMBULATORY_CARE_PROVIDER_SITE_OTHER): Payer: Medicare Other | Admitting: Adult Health

## 2011-08-19 ENCOUNTER — Other Ambulatory Visit: Payer: Self-pay | Admitting: *Deleted

## 2011-08-19 MED ORDER — DILTIAZEM HCL ER COATED BEADS 300 MG PO CP24: 300.0000 mg | ORAL_CAPSULE | Freq: Every day | ORAL | Status: DC

## 2011-08-20 ENCOUNTER — Other Ambulatory Visit: Payer: Self-pay | Admitting: *Deleted

## 2011-08-20 MED ORDER — DILTIAZEM HCL ER COATED BEADS 300 MG PO CP24: 300.0000 mg | ORAL_CAPSULE | Freq: Every day | ORAL | Status: DC

## 2011-08-26 ENCOUNTER — Ambulatory Visit (INDEPENDENT_AMBULATORY_CARE_PROVIDER_SITE_OTHER)
Admission: RE | Admit: 2011-08-26 | Discharge: 2011-08-26 | Disposition: A | Discharge status: Home / Self Care | Payer: Medicare Other | Source: Ambulatory Visit | Attending: Adult Health | Admitting: Adult Health

## 2011-08-26 ENCOUNTER — Ambulatory Visit (INDEPENDENT_AMBULATORY_CARE_PROVIDER_SITE_OTHER): Payer: Medicare Other | Admitting: Adult Health

## 2011-08-26 ENCOUNTER — Encounter: Payer: Self-pay | Admitting: Adult Health

## 2011-08-26 VITALS — BP 130/72 | HR 73 | Temp 98.0°F | Ht 65.0 in | Wt 160.0 lb

## 2011-08-26 DIAGNOSIS — J449 Chronic obstructive pulmonary disease, unspecified: Secondary | ICD-10-CM

## 2011-08-26 DIAGNOSIS — R911 Solitary pulmonary nodule: Secondary | ICD-10-CM

## 2011-08-26 DIAGNOSIS — J4489 Other specified chronic obstructive pulmonary disease: Secondary | ICD-10-CM

## 2011-08-26 NOTE — Progress Notes (Signed)
Subjective:    Patient ID: [name] [name], female    DOB: [birth year]    MRN: [id number]  Brief patient profile:  36 yowf active smoker referred to pulmonary by Dr Jens Som for preop eval of copd previously GOLD III/IV in 2008 and needs L CEA (initial pulmonary consult 01/25/10) .   HPI January 25, 2010 1st pulmonary office eval  worse doe x 3 weeks to point x 50 ft assoc with feeling light headed on benicar and smoking, not using 02.    06/24/2011 Post Hospital follow up  Last seen in office 01/2010.  She returns today for a post hospital follow up . She is  COPD-oxygen dependant 2.5 liters, admitted 05/15/11 for acute resp distress. She had a prolonged hospitalization/critical illness for acute on chronic resp failure secondary to COPD exacerbation w/ initial failure of BIPAP requiring intubation , extubated on 3/22. She required aggressive pulmonary hygiene, IV ABX and Steroids. Stay was complicated by PVST requiring med management. She had a  slow recovery with discharge to rehab on 06/04/11 with deconditioning and anxiety. Required high flow O2 with 5 l rest and 6 l with activity.   Since discharge she is feeling better , breathing back to her baseline.  She is now at home , discharged from IP rehab on 06/19/11.  Currently on Symbicort Twice daily.  Taking Atrovent NEB Twice daily   Taking xopenex neb Twice daily   No smoking since discharge.  On steroid taper, has few days left.  Now on 3 l/m continuously .  Has home health nursing at home  Denies any hemoptysis, chest pain, orthopnea, PND, or leg swelling rec Continue on Symbicort 2 puffs twice daily, brush rinse and gargle after use. Increase ipratropium nebulizer 4 times daily. Change Xopenex nebulizer to as needed only for shortness of breath and wheezing. We are very proud of you for quitting smoking. Keep up the good work. We will change your cardiology appointment to Dr. Jens Som as requested  07/18/2011 f/u ov/Wert cc w/c bound due  to weakness and 02 3lpm 24 hours per day. No cough, confused with meds/ names (only used saba so far on day of ov, not yet gotten around to symbicort) but seems to be getting along ok at this point. No overt hb or sinus complaints or purulent sputum. >>No changes   08/26/2011 Follow up and med calendar  Pt returns for a follow up and med review.  We reviewed all her meds and organized them into a med calendar w/ pt education  She feels she is at her baseline. W/ no flare of cough or dyspnea.  No chest pain or edema        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)  HYPERLIPIDEMIA (ICD-272.4)  HYPERTENSION (ICD-401.9)  CEREBROVASCULAR DISEASE (ICD-437.9)  - 80% R Carotid stenosis - see note by Bradham11/16/11  GLAUCOMA (ICD-365.9)  OBESITY (ICD-278.00)  COPD (ICD-496)  - PFT's 40981191 FEV1 .85(37%) ratio 47 and 11 % better p B2, DLC0 42%  - Walking sats January 25, 2010 > desat even on 2lpm > rec 2.5 lpm 24 h per day       ROS:  Constitutional:   No  weight loss, night sweats,  Fevers, chills,  +fatigue, or  lassitude.  HEENT:   No headaches,  Difficulty swallowing,  Tooth/dental problems, or  Sore throat,                No sneezing, itching, ear ache, nasal congestion, post  nasal drip,   CV:  No chest pain,  Orthopnea, PND,  , anasarca, dizziness, palpitations, syncope.   GI  No heartburn, indigestion, abdominal pain, nausea, vomiting, diarrhea, change in bowel habits, loss of appetite, bloody stools.    ReSP :   No chest wall deformity  Skin: no rash or lesions.  GU: no dysuria, change in color of urine, no urgency or frequency.  No flank pain, no hematuria   MS:  No joint pain or swelling.  No decreased range of motion.    Psych:  No change in mood or affect. No depression or anxiety.  No memory loss.            Objective:   Physical Exam GEN: A/Ox3; pleasant , NAD, chronically ill appearing     HEENT:  [place]/AT,  EACs-clear, TMs-wnl,  NOSE-clear, THROAT-clear, no lesions, no postnasal drip or exudate noted.   NECK:  Supple w/ fair ROM; no JVD; normal carotid impulses w/o bruits; no thyromegaly or nodules palpated; no lymphadenopathy.  RESP  Coarse BS  w/o, wheezes/ rales/ or rhonchi.no accessory muscle use, no dullness to percussion  CARD:  RRR, no m/r/g  , no peripheral edema, pulses intact, no cyanosis or clubbing.  GI:   Soft & nt; nml bowel sounds; no organomegaly or masses detected.  Musco: Warm bil, no deformities or joint swelling noted.   Neuro: alert, no focal deficits noted.    Skin: Warm, no lesions or rashes   cxr 06/24/11 1. Chronic scarring along the right middle lobe and right lower  lobe with obscuration of the right hemidiaphragm and part of the  right heart border. There is some vague opacity just above this,  probably due to adjacent underlying scarring and rib deformities.  2. Increased left infrahilar indistinct density, of uncertain  significance. Some of this may be due to mild rightward rotation  exaggerating the already somewhat prominent left pulmonary vascular  structures. The appearance is much less striking on the lateral  projection       Assessment & Plan:

## 2011-08-26 NOTE — Assessment & Plan Note (Signed)
Compensated on present regimen  Patient's medications were reviewed today and patient education was given. Computerized medication calendar was adjusted/completed  Recent cxr w/ ? Nodularidity in left infrahilar region-will repeat cxr today

## 2011-08-26 NOTE — Patient Instructions (Addendum)
Follow med calendar closely and bring to each visit.  I will call with xray results.  follow up Dr. Sherene Sires  In 2 months and As needed

## 2011-09-02 ENCOUNTER — Other Ambulatory Visit: Payer: Self-pay | Admitting: *Deleted

## 2011-09-02 MED ORDER — ISOSORBIDE MONONITRATE ER 30 MG PO TB24: 30.0000 mg | ORAL_TABLET | Freq: Every day | ORAL | Status: DC

## 2011-09-18 ENCOUNTER — Other Ambulatory Visit: Payer: Self-pay | Admitting: Internal Medicine

## 2011-09-20 ENCOUNTER — Other Ambulatory Visit: Payer: Self-pay | Admitting: Cardiology

## 2011-09-20 MED ORDER — AMIODARONE HCL 200 MG PO TABS: 200.0000 mg | ORAL_TABLET | Freq: Every day | ORAL | Status: DC

## 2011-09-24 ENCOUNTER — Other Ambulatory Visit: Payer: Self-pay | Admitting: *Deleted

## 2011-09-24 DIAGNOSIS — I6529 Occlusion and stenosis of unspecified carotid artery: Secondary | ICD-10-CM

## 2011-09-27 ENCOUNTER — Encounter: Payer: Self-pay | Admitting: Neurosurgery

## 2011-09-30 ENCOUNTER — Other Ambulatory Visit: Payer: Medicare Other

## 2011-09-30 ENCOUNTER — Ambulatory Visit: Payer: Medicare Other | Admitting: Neurosurgery

## 2011-10-09 ENCOUNTER — Other Ambulatory Visit: Payer: Self-pay | Admitting: Physician Assistant

## 2011-10-24 ENCOUNTER — Encounter: Payer: Self-pay | Admitting: Neurosurgery

## 2011-10-25 ENCOUNTER — Ambulatory Visit (INDEPENDENT_AMBULATORY_CARE_PROVIDER_SITE_OTHER): Payer: Medicare Other | Admitting: Neurosurgery

## 2011-10-25 ENCOUNTER — Encounter: Payer: Self-pay | Admitting: Internal Medicine

## 2011-10-25 ENCOUNTER — Ambulatory Visit (INDEPENDENT_AMBULATORY_CARE_PROVIDER_SITE_OTHER): Payer: Medicare Other | Admitting: Internal Medicine

## 2011-10-25 ENCOUNTER — Encounter: Payer: Self-pay | Admitting: Neurosurgery

## 2011-10-25 ENCOUNTER — Other Ambulatory Visit (INDEPENDENT_AMBULATORY_CARE_PROVIDER_SITE_OTHER): Payer: Medicare Other | Admitting: *Deleted

## 2011-10-25 VITALS — BP 142/90 | HR 86 | Resp 16 | Ht 65.5 in | Wt 163.6 lb

## 2011-10-25 VITALS — BP 130/88 | HR 94 | Temp 98.0°F | Ht 66.0 in | Wt 165.0 lb

## 2011-10-25 DIAGNOSIS — J449 Chronic obstructive pulmonary disease, unspecified: Secondary | ICD-10-CM

## 2011-10-25 DIAGNOSIS — J9610 Chronic respiratory failure, unspecified whether with hypoxia or hypercapnia: Secondary | ICD-10-CM

## 2011-10-25 DIAGNOSIS — I6529 Occlusion and stenosis of unspecified carotid artery: Secondary | ICD-10-CM | POA: Insufficient documentation

## 2011-10-25 NOTE — Patient Instructions (Addendum)
symbicort  Take 2 puffs first thing in am and then another 2 puffs about 12 hours later.   Ok to leave off the 02 at rest but you should always wear it at bedtime and as needed during the day with activity    Work on inhaler technique:  relax and gently blow all the way out then take a nice smooth deep breath back in, triggering the inhaler at same time you start breathing in.  Hold for up to 5 seconds if you can.  Rinse and gargle with water when done   If your mouth or throat starts to bother you,   I suggest you time the inhaler to your dental care and after using the inhaler(s) brush teeth and tongue with a baking soda containing toothpaste and when you rinse this out, gargle with it first to see if this helps your mouth and throat.     .See calendar for specific medication instructions and bring it back for each and every office visit for every healthcare provider you see.  Without it,  you may not receive the best quality medical care that we feel you deserve.  You will note that the calendar groups together  your maintenance  medications that are timed at particular times of the day.  Think of this as your checklist for what your doctor has instructed you to do until your next evaluation to see what benefit  there is  to staying on a consistent group of medications intended to keep you well.  The other group at the bottom is entirely up to you to use as you see fit  for specific symptoms that may arise between visits that require you to treat them on an as needed basis.  Think of this as your action plan or "what if" list.   Separating the top medications from the bottom group is fundamental to providing you adequate care going forward.        Please schedule a follow up visit in 3 months but call sooner if needed

## 2011-10-25 NOTE — Progress Notes (Signed)
[facility] Carotid Office Note  CC: Six-month carotid surveillance Referring Physician: Brabham  History of Present Illness: 71 year old female patient of Dr. Myra Gianotti followed for known carotid stenosis with a 80-99% stenosis on the left which was evident in studies prior to today. Due to the patient's medical comorbidities Dr. Myra Gianotti did not think general anesthesia would be a good option unless absolutely necessary and the patient is being managed medically. The patient denies any signs or symptoms of CVA, TIA, amaurosis fugax or any neural deficit.  Past Medical History  Diagnosis Date  . Hypertension   . Hyperlipidemia   . Cerebrovascular disease, unspecified   . Unspecified glaucoma   . Obesity, unspecified   . Coronary artery disease     cath 04/06/10: LAD occluded with R-L collats; med Rx WTC....probable V. Tach...med Rx  . Ventricular tachycardia   . SVT (supraventricular tachycardia)   . AAA (abdominal aortic aneurysm)   . COPD (chronic obstructive pulmonary disease) 2013    [place]    ROS: [x]  Positive   [ ]  Denies    General: [ ]  Weight loss, [ ]  Fever, [ ]  chills Neurologic: [ ]  Dizziness, [ ]  Blackouts, [ ]  Seizure [ ]  Stroke, [ ]  "Mini stroke", [ ]  Slurred speech, [ ]  Temporary blindness; [ ]  weakness in arms or legs, [ ]  Hoarseness Cardiac: [ ]  Chest pain/pressure, [ ]  Shortness of breath at rest [ ]  Shortness of breath with exertion, [ ]  Atrial fibrillation or irregular heartbeat Vascular: [ ]  Pain in legs with walking, [ ]  Pain in legs at rest, [ ]  Pain in legs at night,  [ ]  Non-healing ulcer, [ ]  Blood clot in vein/DVT,   Pulmonary: [ ]  Home oxygen, [ ]  Productive cough, [ ]  Coughing up blood, [ ]  Asthma,  [ ]  Wheezing Musculoskeletal:  [ ]  Arthritis, [ ]  Low back pain, [ ]  Joint pain Hematologic: [ ]  Easy Bruising, [ ]  Anemia; [ ]  Hepatitis Gastrointestinal: [ ]  Blood in stool, [ ]  Gastroesophageal Reflux/heartburn, [ ]   Trouble swallowing Urinary: [ ]  chronic Kidney disease, [ ]  on HD - [ ]  MWF or [ ]  TTHS, [ ]  Burning with urination, [ ]  Difficulty urinating Skin: [ ]  Rashes, [ ]  Wounds Psychological: [ ]  Anxiety, [ ]  Depression   Social History History  Substance Use Topics  . Smoking status: Former Smoker -- 0.3 packs/day for 58 years    Types: Cigarettes    Quit date: 05/18/2011  . Smokeless tobacco: Never Used  . Alcohol Use: No    Family History Family History  Problem Relation Age of Onset  . Heart disease Mother 34  . Heart disease Father     Allergies  Allergen Reactions  . Albuterol     Jittery/shakiness    Current Outpatient Prescriptions  Medication Sig Dispense Refill  . acetaminophen-codeine (TYLENOL #3) 300-30 MG per tablet Take 1 tablet by mouth 3 (three) times daily.       Marland Kitchen amiodarone (PACERONE) 200 MG tablet Take 1 tablet (200 mg total) by mouth daily.  30 tablet  2  . aspirin 81 MG chewable tablet Chew 1 tablet (81 mg total) by mouth daily.      Marland Kitchen atorvastatin (LIPITOR) 80 MG tablet Take 40 mg by mouth daily.       . budesonide-formoterol (SYMBICORT) 160-4.5 MCG/ACT inhaler Inhale 2 puffs into the lungs 2 (two) times daily.      Marland Kitchen diltiazem (CARDIZEM  CD) 300 MG 24 hr capsule Take 1 capsule (300 mg total) by mouth daily.  30 capsule  12  . guaiFENesin (MUCINEX) 600 MG 12 hr tablet Take 1,200 mg by mouth. Every 12 hours as needed      . ipratropium (ATROVENT) 0.02 % nebulizer solution USE 1 VIAL IN NEBULIZER EVERY 6 HOURS.  75 mL  1  . isosorbide mononitrate (IMDUR) 30 MG 24 hr tablet Take 1 tablet (30 mg total) by mouth daily.  30 tablet  3  . LASIX 20 MG tablet TAKE (1) TABLET BY MOUTH ONCE DAILY AS NEEDED.  30 each  9  . levalbuterol (XOPENEX) 0.63 MG/3ML nebulizer solution Take 3 mLs (0.63 mg total) by nebulization every 4 (four) hours as needed for wheezing.  120 mL  1  . nitroGLYCERIN (NITROSTAT) 0.4 MG SL tablet Place 0.4 mg under the tongue every 5 (five) minutes  as needed. Chest pain      . Oxygen-Helium 20-80 % KIT Inhale 5 L/min into the lungs continuous. 5 liters, increase to 6 with activity      . PLAVIX 75 MG tablet Take 1 tablet by mouth Daily.      . potassium chloride SA (K-DUR,KLOR-CON) 20 MEQ tablet Take 20 mEq by mouth 2 (two) times daily.      . promethazine (PHENERGAN) 25 MG tablet Take 25 mg by mouth every 4 (four) hours as needed.      . travoprost, benzalkonium, (TRAVATAN) 0.004 % ophthalmic solution Place 1 drop into both eyes at bedtime.          Physical Examination  Filed Vitals:   10/25/11 1354  BP: 142/90  Pulse:   Resp:     Body mass index is 26.81 kg/(m^2).  General:  WDWN in NAD Gait: Normal HEENT: WNL Eyes: Pupils equal Pulmonary: normal non-labored breathing , without Rales, rhonchi,  wheezing Cardiac: RRR, without  Murmurs, rubs or gallops; Abdomen: soft, NT, no masses Skin: no rashes, ulcers noted  Vascular Exam Pulses: 2+ radial pulses bilaterally Carotid bruits: Carotid pulse to auscultation on the right, no bruit or pulses heard on the left Extremities without ischemic changes, no Gangrene , no cellulitis; no open wounds;  Musculoskeletal: no muscle wasting or atrophy   Neurologic: A&O X 3; Appropriate Affect ; SENSATION: normal; MOTOR FUNCTION:  moving all extremities equally. Speech is fluent/normal  Non-Invasive Vascular Imaging CAROTID DUPLEX 10/25/2011  Right ICA 20 - 39 % stenosis Left ICA 80 - 99 % stenosis   ASSESSMENT/PLAN: Asymptomatic patient with advance carotid stenosis on the left, the patient knows the signs and symptoms of CVA and knows to report to the nearest emergency department should that occur. The patient will followup here in 6 months with repeat carotid duplex, her questions were encouraged and answered, she is in agreement with this plan.  Lauree Chandler ANP   Clinic MD: Imogene Burn

## 2011-10-25 NOTE — Progress Notes (Signed)
Subjective:    Patient ID: [name] [name], female    DOB: [birth year]    MRN: [id number]  Brief patient profile:  71 yowf quit smoking  04/2011 dx'd with stage III COPD in 2004.   HPI January 25, 2010 1st pulmonary office eval  worse doe x 3 weeks to point x 50 ft assoc with feeling light headed on benicar and smoking, not using 02.    06/24/2011 Post Hospital follow up  Last seen in office 01/2010.  She returns today for a post hospital follow up . She is  COPD-oxygen dependant 2.5 liters, admitted 05/15/11 for acute resp distress. She had a prolonged hospitalization/critical illness for acute on chronic resp failure secondary to COPD exacerbation w/ initial failure of BIPAP requiring intubation , extubated on 3/22. She required aggressive pulmonary hygiene, IV ABX and Steroids. Stay was complicated by PVST requiring med management. She had a  slow recovery with discharge to rehab on 06/04/11 with deconditioning and anxiety. Required high flow O2 with 5 l rest and 6 l with activity.   Since discharge she is feeling better , breathing back to her baseline.  She is now at home , discharged from IP rehab on 06/19/11.  Currently on Symbicort Twice daily.  Taking Atrovent NEB Twice daily   Taking xopenex neb Twice daily   No smoking since discharge.  On steroid taper, has few days left.  Now on 3 l/m continuously .  Has home health nursing at home  Denies any hemoptysis, chest pain, orthopnea, PND, or leg swelling rec Continue on Symbicort 2 puffs twice daily, brush rinse and gargle after use. Increase ipratropium nebulizer 4 times daily. Change Xopenex nebulizer to as needed only for shortness of breath and wheezing. We are very proud of you for quitting smoking. Keep up the good work. We will change your cardiology appointment to Dr. Jens [name] as requested  07/18/2011 f/u ov/[name] [name] cc w/c bound due to weakness and 02 3lpm 24 hours per day. No cough, confused with meds/ names (only used saba so far on  day of ov, not yet gotten around to symbicort) but seems to be getting along ok at this point. No overt hb or sinus complaints or purulent sputum. rec Symbicort Take 2 puffs first thing in am and then another 2 puffs about 12 hours later.  Work on inhaler technique:   .    Only use your albuterol as a rescue medication  08/26/2011 Follow up and med calendar  Pt returns for a follow up and med review.  We reviewed all her meds and organized them into a med calendar w/ pt education  She feels she is at her baseline. W/ no flare of cough or dyspnea.  rec No change rx, follow med calendar  10/25/2011 f/u ov/[name] [name] cc sob across parking lot to building no longer using 02 consistently doesn't really think it improves her activity tolerance.  No obvious daytime variabilty or assoc chronic cough or cp or chest tightness, subjective wheeze overt sinus or hb symptoms. No unusual exp hx   Not using med calendar, denies she ever got one, doesn't recognize the copy we made and brought papers from the visit before the caledar was done.  Sleeping ok without nocturnal  or early am exacerbation  of respiratory  c/o's or need for noct saba. Also denies any obvious fluctuation of symptoms with weather or environmental changes or other aggravating or alleviating factors except as outlined above   ROS  The  following are not active complaints unless bolded sore throat, dysphagia, dental problems, itching, sneezing,  nasal congestion or excess/ purulent secretions, ear ache,   fever, chills, sweats, unintended wt loss, pleuritic or exertional cp, hemoptysis,  orthopnea pnd or leg swelling, presyncope, palpitations, heartburn, abdominal pain, anorexia, nausea, vomiting, diarrhea  or change in bowel or urinary habits, change in stools or urine, dysuria,hematuria,  rash, arthralgias, visual complaints, headache, numbness weakness or ataxia or problems with walking or coordination,  change in mood/affect or memory.           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)  HYPERLIPIDEMIA (ICD-272.4)  HYPERTENSION (ICD-401.9)  CEREBROVASCULAR DISEASE (ICD-437.9)  - 80% R Carotid stenosis - see note by Bradham11/16/11  GLAUCOMA (ICD-365.9)  OBESITY (ICD-278.00)  COPD (ICD-496)  - PFT's 12/10/2002 FEV1 .85(37%) ratio 47 and 11 % better p B2, DLC0 42%  - Walking sats January 25, 2010 > desat even on 2lpm > rec 2.5 lpm 24 h per day                   Objective:   Physical Exam GEN: A/Ox3; pleasant , NAD, chronically ill appearing     HEENT:  [place]/AT,  EACs-clear, TMs-wnl, NOSE-clear, THROAT-clear, no lesions, no postnasal drip or exudate noted.   NECK:  Supple w/ fair ROM; no JVD; normal carotid impulses w/o bruits; no thyromegaly or nodules palpated; no lymphadenopathy.  RESP  Coarse BS  w/o, wheezes/ rales/ or rhonchi.no accessory muscle use, no dullness to percussion  CARD:  RRR, no m/r/g  , no peripheral edema, pulses intact, no cyanosis or clubbing.  GI:   Soft & nt; nml bowel sounds; no organomegaly or masses detected.  Musco: Warm bil, no deformities or joint swelling noted.   Neuro: alert, no focal deficits noted.    Skin: Warm, no lesions or rashes   cxr 08/26/11 1. The previously seen vague densities in both lungs are no longer  demonstrated.  2. Stable scarring at the right lung base and changes of COPD and  chronic bronchitis.        Assessment & Plan:

## 2011-10-26 ENCOUNTER — Encounter: Payer: Self-pay | Admitting: Internal Medicine

## 2011-10-26 NOTE — Assessment & Plan Note (Signed)
-   PFT's 12/10/2002 FEV1 .85(37%) ratio 47 and 11 % better p B2, DLC0 42%  - Walking sats January 25, 2010 > desat even on 2lpm > rec 2.5 lpm 24 h per day   - HFA 75% p coaching 07/18/2011    I had an extended discussion with the patient today lasting 15 to 20 minutes of a 25 minute visit on the following issues:     Each maintenance medication was reviewed in detail including most importantly the difference between maintenance and as needed and under what circumstances the prns are to be used. This was done in the context of a medication calendar review which provided the patient with a user-friendly unambiguous mechanism for medication administration and reconciliation and provides an action plan for all active problems. It is critical that this be shown to every doctor  for modification during the office visit if necessary so the patient can use it as a working document.      See separate discussion re use of 02

## 2011-11-04 ENCOUNTER — Other Ambulatory Visit: Payer: Self-pay | Admitting: Internal Medicine

## 2011-12-03 ENCOUNTER — Other Ambulatory Visit: Payer: Self-pay | Admitting: Urology

## 2011-12-03 MED ORDER — MITOMYCIN CHEMO FOR BLADDER INSTILLATION 40 MG: 40.0000 mg | Freq: Once | INTRAVENOUS | Status: DC

## 2011-12-12 ENCOUNTER — Encounter (HOSPITAL_COMMUNITY): Payer: Self-pay | Admitting: Pharmacy Technician

## 2011-12-17 ENCOUNTER — Encounter (HOSPITAL_COMMUNITY)
Admission: RE | Admit: 2011-12-17 | Discharge: 2011-12-17 | Disposition: A | Discharge status: Home / Self Care | Payer: Medicare Other | Source: Ambulatory Visit | Attending: Urology | Admitting: Urology

## 2011-12-17 ENCOUNTER — Encounter (HOSPITAL_COMMUNITY): Payer: Self-pay

## 2011-12-17 ENCOUNTER — Ambulatory Visit (HOSPITAL_COMMUNITY)
Admission: RE | Admit: 2011-12-17 | Discharge: 2011-12-17 | Disposition: A | Discharge status: Home / Self Care | Payer: Medicare Other | Source: Ambulatory Visit | Attending: Urology | Admitting: Urology

## 2011-12-17 DIAGNOSIS — R0602 Shortness of breath: Secondary | ICD-10-CM | POA: Insufficient documentation

## 2011-12-17 DIAGNOSIS — I2510 Atherosclerotic heart disease of native coronary artery without angina pectoris: Secondary | ICD-10-CM | POA: Insufficient documentation

## 2011-12-17 HISTORY — DX: Shortness of breath: R06.02

## 2011-12-17 HISTORY — DX: Adverse effect of unspecified anesthetic, initial encounter: T41.45XA

## 2011-12-17 HISTORY — DX: Unspecified osteoarthritis, unspecified site: M19.90

## 2011-12-17 HISTORY — DX: Other complications of anesthesia, initial encounter: T88.59XA

## 2011-12-17 LAB — CBC
Hemoglobin: 14.8 g/dL (ref 12.0–15.0)
MCH: 29.8 pg (ref 26.0–34.0)
MCHC: 32.4 g/dL (ref 30.0–36.0)
MCV: 92.1 fL (ref 78.0–100.0)
RBC: 4.96 MIL/uL (ref 3.87–5.11)

## 2011-12-17 LAB — BASIC METABOLIC PANEL
BUN: 15 mg/dL (ref 6–23)
CO2: 29 mEq/L (ref 19–32)
Calcium: 9.4 mg/dL (ref 8.4–10.5)
Creatinine, Ser: 0.67 mg/dL (ref 0.50–1.10)
GFR calc non Af Amer: 87 mL/min — ABNORMAL LOW (ref >90)
Glucose, Bld: 75 mg/dL (ref 70–99)
Sodium: 140 mEq/L (ref 135–145)

## 2011-12-17 LAB — SURGICAL PCR SCREEN: MRSA, PCR: NEGATIVE

## 2011-12-17 LAB — PROTIME-INR: INR: 0.88 (ref 0.00–1.49)

## 2011-12-17 NOTE — Patient Instructions (Signed)
YOUR SURGERY IS SCHEDULED AT [facility]  ON:  Tuesday  10/29  AT 12:30 PM  REPORT TO [place] [facility]AY [facility] AT:  10:30 AM      PHONE # FOR SHORT STAY IS [phone]  DO NOT EAT  ANYTHING AFTER MIDNIGHT THE NIGHT BEFORE YOUR SURGERY.   NO FOOD, NO CHEWING GUM, NO MINTS, NO CANDIES, NO CHEWING TOBACCO.  YOU MAY HAVE CLEAR LIQUIDS TO DRINK FROM MIDNIGHT THE NIGHT BEFORE YOUR SURGERY --UNTIL 6:30 AM DAY OF SURGERY--LIKE WATER, COFFEE, SODA.  NOTHING TO DRINK AFTER 6:30 AM !!!!!!  PLEASE TAKE THE FOLLOWING MEDICATIONS THE AM OF YOUR SURGERY WITH A FEW SIPS OF WATER:  LIPITOR, TYLENOL # 3, MUCINEX.   USE YOUR NEBULIZER AND SYMBICORT.   IF YOU USE INHALERS--USE YOUR INHALERS THE AM OF YOUR SURGERY AND BRING INHALERS TO THE HOSPITAL -TAKE TO SURGERY.    IF YOU ARE DIABETIC:  DO NOT TAKE ANY DIABETIC MEDICATIONS THE AM OF YOUR SURGERY.  IF YOU TAKE INSULIN IN THE EVENINGS--PLEASE ONLY TAKE 1/2 NORMAL EVENING DOSE THE NIGHT BEFORE YOUR SURGERY.  NO INSULIN THE AM OF YOUR SURGERY.  IF YOU HAVE SLEEP APNEA AND USE CPAP OR BIPAP--PLEASE BRING THE MASK AND THE TUBING.  DO NOT BRING YOUR MACHINE.  DO NOT BRING VALUABLES, MONEY, CREDIT CARDS.  DO NOT WEAR JEWELRY, MAKE-UP, NAIL POLISH AND NO METAL PINS OR CLIPS IN YOUR HAIR. CONTACT LENS, DENTURES / PARTIALS, GLASSES SHOULD NOT BE WORN TO SURGERY AND IN MOST CASES-HEARING AIDS WILL NEED TO BE REMOVED.  BRING YOUR GLASSES CASE, ANY EQUIPMENT NEEDED FOR YOUR CONTACT LENS. FOR PATIENTS ADMITTED TO THE HOSPITAL--CHECK OUT TIME THE DAY OF DISCHARGE IS 11:00 AM.  ALL INPATIENT ROOMS ARE PRIVATE - WITH BATHROOM, TELEPHONE, TELEVISION AND WIFI INTERNET.  IF YOU ARE BEING DISCHARGED THE SAME DAY OF YOUR SURGERY--YOU CAN NOT DRIVE YOURSELF HOME--AND SHOULD NOT GO HOME ALONE BY TAXI OR BUS.  NO DRIVING OR OPERATING MACHINERY FOR 24 HOURS FOLLOWING ANESTHESIA / PAIN MEDICATIONS.  PLEASE MAKE ARRANGEMENTS FOR SOMEONE TO BE WITH YOU AT HOME THE FIRST 24  HOURS AFTER SURGERY. RESPONSIBLE DRIVER'S NAME:      HUSBAND -ROGER                                               PHONE # [phone]                               PLEASE READ OVER ANY  FACT SHEETS THAT YOU WERE GIVEN: MRSA INFORMATION, BLOOD TRANSFUSION INFORMATION, INCENTIVE SPIROMETER INFORMATION.

## 2011-12-17 NOTE — Pre-Procedure Instructions (Signed)
CBC, BMET, PT, PTT AND CXR WERE DONE TODAY AT [facility] PREOP--AS PER ANESTHESIOLOGIST'S GUIDELINES.  PT STATED TODAY THAT SHE FELT MORE SOB THAN USUAL--AND PT HAS HX OF RESPIRATORY FAILURE BACK IN [facility] 2013.  PT HAD MULTIPLE BRUISES ON HER ARMS--STATES STOPPED PLAVIX Monday--BUT FEELS THAT HER BRUISING IS WORSENING. PT HAS AN EKG REPORT IN EPIC FROM 07/17/11.

## 2011-12-23 NOTE — H&P (Signed)
History of Present Illness        F/u recurrent bladder cancer.  Prior bladder ca dx and tx: -Nov 2009 - LG Ta -Feb 2010 completed BCG x 6 -Apr 2010 LG Ta -Sep 2010 completed BCG x 6 -Feb 2011 PUNLMP  -April 2012 maintenance BCG -Jun 2012 nl cysto - she did not f/u as planned -Feb 2013 nl cysto - she did not f/u as planned  Interval hx  She has had no recent hematuria. She has had no dysuria. She did not followup as she was in the hospital with respiratory failure on the ventilator. She has severe COPD. She has 80-99% stenosis of the left carotid but is managed medically as she is not felt to be a surgical candidate.  She is followed by Dr. Sherene Sires. I reviewed her Epic chart.   June 2013: BUN 11, creatinine 0.63   Past Medical History Problems  1. History of  Arthritis V13.4 2. History of  Bladder Cancer V10.51 3. History of  Chronic Obstructive Pulmonary Disease 496 4. History of  Glaucoma 365.9 5. History of  Heartburn 787.1 6. History of  Hypertension 401.9  Surgical History Problems  1. History of  Cystoscopy With Fulguration Minor Lesion (Under 5mm) 2. History of  Cystoscopy With Fulguration Small Lesion (5-38mm) 3. History of  Lung Surgery 4. History of  Tubal Ligation V25.2  Current Meds 1. Aspirin 81 MG Oral Tablet; Therapy: (Recorded:14Aug2008) to 2. Goody Headache PACK; Therapy: (Recorded:23May2011) to 3. Lasix TABS; Therapy: (Recorded:21Feb2013) to 4. Lipitor 40 MG Oral Tablet; Therapy: (Recorded:14Aug2008) to 5. Phenergan TABS; Therapy: (Recorded:23May2011) to 6. Plavix TABS; Therapy: (Recorded:21Feb2013) to 7. Potassium TABS; Therapy: (Recorded:23Mar2012) to 8. Toprol XL 50 MG TBCR; Therapy: (Recorded:14Aug2008) to 9. Travatan SOLN; Therapy: (Recorded:14Aug2008) to 10. Tylenol with Codeine #3 TABS; Therapy: (Recorded:14Aug2008) to  Allergies Medication  1. Levaquin TABS  Family History Problems  1. Maternal history of  Congestive Heart Failure 2.  Family history of  Death In The Family Father Deceased at age 75 3. Family history of  Death In The Family Mother Deceased at age 42- CHF 4. Daughter's history of  Diabetes Mellitus V18.0 5. Paternal history of  Heart Disease V17.49 6. Maternal history of  Heart Disease V17.49  Social History Problems  1. Caffeine Use 3 Cups of coffee and 2 cola's or tea 2. Tobacco Use V15.82 Smoked 2 pack per day for 50 yrs Denied  3. History of  Alcohol Use  Review of Systems Constitutional system(s) were reviewed and pertinent findings if present are noted.  Cardiovascular: no chest pain.  Respiratory: shortness of breath.    Vitals Vital Signs [Data Includes: Last 1 Day]  07Oct2013 01:06PM  BMI Calculated: 27.49 BSA Calculated: 1.82 Height: 5 ft 5 in Weight: 165 lb  Blood Pressure: 117 / 75 Heart Rate: 86  Physical Exam Constitutional: Well nourished and well developed . No acute distress.  Pulmonary: No respiratory distress and normal respiratory rhythm and effort.  Cardiovascular: Heart rate and rhythm are normal . No peripheral edema.  Neuro/Psych:. Mood and affect are appropriate.    Results/Data Urine [Data Includes: Last 1 Day]   07Oct2013  COLOR YELLOW   APPEARANCE CLEAR   SPECIFIC GRAVITY <1.005   pH 6.0   GLUCOSE NEG mg/dL  BILIRUBIN NEG   KETONE NEG mg/dL  BLOOD NEG   PROTEIN NEG mg/dL  UROBILINOGEN 0.2 mg/dL  NITRITE NEG   LEUKOCYTE ESTERASE NEG    Procedure  Procedure: Cystoscopy  Indication: History of Urothelial Carcinoma.  Informed Consent: Risks, benefits, and potential adverse events were discussed and informed consent was obtained from the patient.  Prep: The patient was prepped with betadine.  Antibiotic prophylaxis: Ciprofloxacin.  Procedure Note:  Urethral meatus:. No abnormalities.  Anterior urethra: No abnormalities.  Bladder: Visulization was clear. The ureteral orifices were in the normal anatomic position bilaterally and had clear efflux  of urine. A papillary tumor was seen in the bladder. This tumor was located on the right side, on the anterior aspect of the bladder. Another papillary tumor was seen in the bladder. This tumor was located on the right side, on the anterior aspect of the bladder.     The tumors were very small papillary recurrences and adjacent to one another. They appeared low-grade. They were fulgurated but unfortunately there were 2 anterior and too many to eradicate. The patient tolerated the procedure well.  Complications: None.    Assessment Assessed  1. Bladder Cancer 188.9  Plan Bladder Cancer (188.9)  1. Follow-up [place] Procedure Office  Follow-up  Requested for: 21Mar2013 2. Follow-up Schedule Surgery Office  Follow-up  Requested for: 07Oct2013 3. Cysto Fulguration Lesion Less 0.5 cm  Done: 07Oct2013 Health Maintenance (V70.0)  4. UA With REFLEX  Done: 07Oct2013 12:26PM  Discussion/Summary  I discussed with the patient in the cystoscopic findings. We discussed the nature risk and benefits of cystoscopy with bladder biopsy, fulguration and mitomycin C in the operating room. She will need the larger rigid cystoscope and Bugbee in the OR. She should do okay with minimal sedation, but the larger instruments are needed. A minimal biopsy could be done.   cc: Dr. Lysbeth Galas; Dr. Alonna Minium Electronically signed by : Jerilee Field, M.D.; Dec 02 2011  2:35PM

## 2011-12-24 ENCOUNTER — Encounter (HOSPITAL_COMMUNITY): Payer: Self-pay | Admitting: Anesthesiology

## 2011-12-24 ENCOUNTER — Ambulatory Visit (HOSPITAL_COMMUNITY): Payer: Medicare Other | Admitting: Anesthesiology

## 2011-12-24 ENCOUNTER — Ambulatory Visit (HOSPITAL_COMMUNITY)
Admission: RE | Admit: 2011-12-24 | Discharge: 2011-12-24 | Disposition: A | Discharge status: Home / Self Care | Payer: Medicare Other | Source: Ambulatory Visit | Attending: Urology | Admitting: Urology

## 2011-12-24 ENCOUNTER — Encounter (HOSPITAL_COMMUNITY)
Admission: RE | Disposition: A | Discharge status: Home / Self Care | Payer: Self-pay | Source: Ambulatory Visit | Attending: Urology

## 2011-12-24 ENCOUNTER — Encounter (HOSPITAL_COMMUNITY): Payer: Self-pay | Admitting: *Deleted

## 2011-12-24 DIAGNOSIS — Z7982 Long term (current) use of aspirin: Secondary | ICD-10-CM | POA: Insufficient documentation

## 2011-12-24 DIAGNOSIS — I10 Essential (primary) hypertension: Secondary | ICD-10-CM | POA: Insufficient documentation

## 2011-12-24 DIAGNOSIS — Z79899 Other long term (current) drug therapy: Secondary | ICD-10-CM | POA: Insufficient documentation

## 2011-12-24 DIAGNOSIS — J449 Chronic obstructive pulmonary disease, unspecified: Secondary | ICD-10-CM | POA: Insufficient documentation

## 2011-12-24 DIAGNOSIS — C679 Malignant neoplasm of bladder, unspecified: Secondary | ICD-10-CM | POA: Insufficient documentation

## 2011-12-24 DIAGNOSIS — J4489 Other specified chronic obstructive pulmonary disease: Secondary | ICD-10-CM | POA: Insufficient documentation

## 2011-12-24 HISTORY — PX: CYSTOSCOPY WITH BIOPSY: SHX5122

## 2011-12-24 SURGERY — CYSTOSCOPY, WITH BIOPSY
Anesthesia: Monitor Anesthesia Care | Site: Bladder | Wound class: Clean Contaminated

## 2011-12-24 MED ORDER — LIDOCAINE HCL 2 % EX GEL
CUTANEOUS | Status: AC
  Filled 2011-12-24: qty 10

## 2011-12-24 MED ORDER — BELLADONNA ALKALOIDS-OPIUM 16.2-60 MG RE SUPP
RECTAL | Status: AC
  Filled 2011-12-24: qty 1

## 2011-12-24 MED ORDER — LIDOCAINE HCL 2 % EX GEL
CUTANEOUS | Status: DC | PRN
  Administered 2011-12-24: 1 via URETHRAL

## 2011-12-24 MED ORDER — LACTATED RINGERS IV SOLN
INTRAVENOUS | Status: DC
  Administered 2011-12-24: 1000 mL via INTRAVENOUS

## 2011-12-24 MED ORDER — ACETAMINOPHEN 10 MG/ML IV SOLN
1000.0000 mg | Freq: Once | INTRAVENOUS | Status: AC
  Administered 2011-12-24: 1000 mg via INTRAVENOUS

## 2011-12-24 MED ORDER — STERILE WATER FOR IRRIGATION IR SOLN
Status: DC | PRN
  Administered 2011-12-24: 3000 mL via INTRAVESICAL

## 2011-12-24 MED ORDER — LACTATED RINGERS IV SOLN
INTRAVENOUS | Status: DC | PRN
  Administered 2011-12-24: 12:00:00 via INTRAVENOUS

## 2011-12-24 MED ORDER — ONDANSETRON HCL 4 MG/2ML IJ SOLN
INTRAMUSCULAR | Status: DC | PRN
  Administered 2011-12-24: 4 mg via INTRAVENOUS

## 2011-12-24 MED ORDER — SULFAMETHOXAZOLE-TRIMETHOPRIM 400-80 MG PO TABS: 1.0000 | ORAL_TABLET | Freq: Two times a day (BID) | ORAL | Status: DC

## 2011-12-24 MED ORDER — MITOMYCIN CHEMO FOR BLADDER INSTILLATION 40 MG
40.0000 mg | Freq: Once | INTRAVENOUS | Status: AC
  Administered 2011-12-24: 40 mg via INTRAVESICAL
  Filled 2011-12-24: qty 40

## 2011-12-24 MED ORDER — PROPOFOL 10 MG/ML IV EMUL
INTRAVENOUS | Status: DC | PRN
  Administered 2011-12-24: 75 ug/kg/min via INTRAVENOUS

## 2011-12-24 MED ORDER — PROMETHAZINE HCL 25 MG/ML IJ SOLN: 6.2500 mg | INTRAMUSCULAR | Status: DC | PRN

## 2011-12-24 MED ORDER — FENTANYL CITRATE 0.05 MG/ML IJ SOLN: 25.0000 ug | INTRAMUSCULAR | Status: DC | PRN

## 2011-12-24 MED ORDER — CEFAZOLIN SODIUM-DEXTROSE 2-3 GM-% IV SOLR
2.0000 g | INTRAVENOUS | Status: AC
  Administered 2011-12-24: 2 g via INTRAVENOUS

## 2011-12-24 MED ORDER — MIDAZOLAM HCL 5 MG/5ML IJ SOLN
INTRAMUSCULAR | Status: DC | PRN
  Administered 2011-12-24 (×2): 1 mg via INTRAVENOUS

## 2011-12-24 MED ORDER — CLOPIDOGREL BISULFATE 75 MG PO TABS: 75.0000 mg | ORAL_TABLET | Freq: Every day | ORAL | Status: AC

## 2011-12-24 MED ORDER — CEFAZOLIN SODIUM-DEXTROSE 2-3 GM-% IV SOLR
INTRAVENOUS | Status: AC
  Filled 2011-12-24: qty 50

## 2011-12-24 MED ORDER — ACETAMINOPHEN 10 MG/ML IV SOLN
INTRAVENOUS | Status: AC
  Filled 2011-12-24: qty 100

## 2011-12-24 MED ORDER — BELLADONNA ALKALOIDS-OPIUM 16.2-60 MG RE SUPP
RECTAL | Status: DC | PRN
  Administered 2011-12-24: 1 via RECTAL

## 2011-12-24 MED ORDER — LIDOCAINE HCL 1 % IJ SOLN
INTRAMUSCULAR | Status: DC | PRN
  Administered 2011-12-24: 50 mg via INTRADERMAL

## 2011-12-24 MED ORDER — KETAMINE HCL 10 MG/ML IJ SOLN
INTRAMUSCULAR | Status: DC | PRN
  Administered 2011-12-24: 20 mg via INTRAVENOUS
  Administered 2011-12-24: 10 mg via INTRAVENOUS
  Administered 2011-12-24: 20 mg via INTRAVENOUS

## 2011-12-24 SURGICAL SUPPLY — 21 items
BAG URINE DRAINAGE (UROLOGICAL SUPPLIES) ×1 IMPLANT
BAG URO CATCHER STRL LF (DRAPE) ×2 IMPLANT
CATH FOLEY 2WAY SLVR  5CC 16FR (CATHETERS) ×1
CATH FOLEY 2WAY SLVR 5CC 16FR (CATHETERS) IMPLANT
CATH ROBINSON RED A/P 16FR (CATHETERS) IMPLANT
CLOTH BEACON ORANGE TIMEOUT ST (SAFETY) ×2 IMPLANT
DRAPE CAMERA CLOSED 9X96 (DRAPES) ×2 IMPLANT
ELECT REM PT RETURN 9FT ADLT (ELECTROSURGICAL) ×2
ELECTRODE REM PT RTRN 9FT ADLT (ELECTROSURGICAL) ×1 IMPLANT
GLOVE BIOGEL M STRL SZ7.5 (GLOVE) ×2 IMPLANT
GOWN PREVENTION PLUS XLARGE (GOWN DISPOSABLE) ×2 IMPLANT
GOWN STRL NON-REIN LRG LVL3 (GOWN DISPOSABLE) ×2 IMPLANT
GOWN STRL REIN XL XLG (GOWN DISPOSABLE) ×2 IMPLANT
MANIFOLD NEPTUNE II (INSTRUMENTS) ×2 IMPLANT
NDL SAFETY ECLIPSE 18X1.5 (NEEDLE) IMPLANT
NEEDLE HYPO 18GX1.5 SHARP (NEEDLE) ×2
NEEDLE HYPO 22GX1.5 SAFETY (NEEDLE) IMPLANT
PACK CYSTO (CUSTOM PROCEDURE TRAY) ×2 IMPLANT
PAD TELFA 2X3 NADH STRL (GAUZE/BANDAGES/DRESSINGS) ×1 IMPLANT
TUBING CONNECTING 10 (TUBING) ×1 IMPLANT
WATER STERILE IRR 3000ML UROMA (IV SOLUTION) ×2 IMPLANT

## 2011-12-24 NOTE — Interval H&P Note (Signed)
History and Physical Interval Note:  12/24/2011 12:09 PM  [name] [name]  has presented today for surgery, with the diagnosis of Bladder Cancer  The various methods of treatment have been discussed with the patient and family. After consideration of risks, benefits and other options for treatment, the patient has consented to  Procedure(s) (LRB) with comments: CYSTOSCOPY WITH BIOPSY (N/A) as a surgical intervention .  The patient's history has been reviewed, patient examined, no change in status, stable for surgery.  I have reviewed the patient's chart and labs.  Questions were answered to the patient's satisfaction.  I discussed side effects of the proposed treatment, the likelihood of the patient achieving the goals of the procedure, and any potential problems that might occur during the procedure or recuperation.     Antony Haste

## 2011-12-24 NOTE — Anesthesia Preprocedure Evaluation (Signed)
Anesthesia Evaluation  Patient identified by MRN, date of birth, ID band Patient awake    Reviewed: Allergy & Precautions, H&P , NPO status , Patient's Chart, lab work & pertinent test results  Airway Mallampati: II TM Distance: <3 FB Neck ROM: Full    Dental No notable dental hx. (+) Edentulous Upper and Edentulous Lower   Pulmonary COPDformer smoker,  breath sounds clear to auscultation  + decreased breath sounds      Cardiovascular hypertension, + CAD and + Peripheral Vascular Disease + dysrhythmias Supra Ventricular Tachycardia Rhythm:Regular Rate:Normal     Neuro/Psych negative neurological ROS  negative psych ROS   GI/Hepatic negative GI ROS, Neg liver ROS,   Endo/Other  negative endocrine ROS  Renal/GU negative Renal ROS  negative genitourinary   Musculoskeletal negative musculoskeletal ROS (+)   Abdominal   Peds negative pediatric ROS (+)  Hematology negative hematology ROS (+)   Anesthesia Other Findings   Reproductive/Obstetrics negative OB ROS                           Anesthesia Physical Anesthesia Plan  ASA: III  Anesthesia Plan: MAC   Post-op Pain Management:    Induction: Intravenous  Airway Management Planned: Simple Face Mask  Additional Equipment:   Intra-op Plan:   Post-operative Plan:   Informed Consent: I have reviewed the patients History and Physical, chart, labs and discussed the procedure including the risks, benefits and alternatives for the proposed anesthesia with the patient or authorized representative who has indicated his/her understanding and acceptance.     Plan Discussed with: CRNA and Surgeon  Anesthesia Plan Comments:         Anesthesia Quick Evaluation

## 2011-12-24 NOTE — Progress Notes (Signed)
Post Op Phase 2 in Short Stay After using Symbicort and being on RA 2 minutes 02 sats 93-94 . Pt ready for discharge. She" did not bring home 02 tank with her but is going home to lay down and will be putting it on" Pt discharged via w/c to husband in car at entrance

## 2011-12-24 NOTE — Progress Notes (Signed)
Arrived from PACU pt very eager to go home.

## 2011-12-24 NOTE — Anesthesia Postprocedure Evaluation (Signed)
  Anesthesia Post-op Note  Patient: [name] [name]  Procedure(s) Performed: Procedure(s) (LRB): CYSTOSCOPY WITH BIOPSY (N/A)  Patient Location: PACU  Anesthesia Type: MAC  Level of Consciousness: awake and alert   Airway and Oxygen Therapy: Patient Spontanous Breathing  Post-op Pain: mild  Post-op Assessment: Post-op Vital signs reviewed, Patient's Cardiovascular Status Stable, Respiratory Function Stable, Patent Airway and No signs of Nausea or vomiting  Post-op Vital Signs: stable  Complications: No apparent anesthesia complications

## 2011-12-24 NOTE — Transfer of Care (Signed)
Immediate Anesthesia Transfer of Care Note  Patient: [name] [name]  Procedure(s) Performed: Procedure(s) (LRB) with comments: CYSTOSCOPY WITH BIOPSY (N/A)  Patient Location: PACU  Anesthesia Type:MAC  Level of Consciousness: awake and alert   Airway & Oxygen Therapy: Patient Spontanous Breathing and Patient connected to face mask oxygen  Post-op Assessment: Report given to PACU RN and Post -op Vital signs reviewed and stable  Post vital signs: Reviewed and stable  Complications: No apparent anesthesia complications

## 2011-12-24 NOTE — Progress Notes (Signed)
Post Op in Short Stay Phase 2 pt up to BR and able to void moderat amt clear urine. When returing to room 02 sat 78-80 on RA asked pt about her rescue inhalers and she showed me her Symbicort which she used .0xygen nasal cannula replaced on pt at 2L and 02 sats up to 89

## 2011-12-24 NOTE — Progress Notes (Signed)
Pt in Short Stay post op and wanting to go to BR Removed 02 and assisted pt to BR via w/c unable to void and pt very adamant she wants to go home. Explained to pt criteria to go home and she verbalized understanding assisted pt back to stretcher 02 Sat 82 on RA pt states she only wears 02 intemittantly and 02 was placed back on pt at 2L with an 02 sat of 90 Dr Mena Goes here to see husband while pt in BR

## 2011-12-24 NOTE — Op Note (Signed)
Preoperative diagnosis: Bladder cancer Postoperative diagnosis: Bladder cancer  Procedure: Cystoscopy with bladder biopsy and fulguration 2-5 cm of tumor Installation of mitomycin C. in PACU  Surgeon: Mena Goes  Anesthesia: MAC  Findings: Exam under anesthesia-the bladder and urethra were palpably normal without mass. There were no masses on abdominal palpation. Cystoscopy-the urethra and trigone in the ureteral orifice these were all normal. The bladder was examined with a 12 and 70 lens. There is a papillary tumor on the right bladder wall. Inferior to this was another papillary tumor with a very tiny stalk. The posterior bladder wall just left of midline there was a patch of papillary tumor. Photos were taken. The right superior tumor in the left posterior tumor biopsy. The right inferior tumor was very superficial and likely low grade and was just fulgurated.  Description of procedure: After consent was obtained patient brought to the operating room. A timeout was performed to confirm the patient and procedure. After adequate anesthesia she was placed in lithotomy position and examined under anesthesia. A cystoscope was passed per urethra and the bladder examined. Using the cold cup biopsy forceps the right superior lesion was biopsied. Next the left posterior lesion was biopsied and a representative area. All 3 areas were then fulgurated. On the left posterior extended fulguration to cauterize it the remainder the abnormal mucosa. Adequate hemostasis was insured. And a 43 French Foley was placed.  Complications: None  Estimated blood loss: Minimal  Drains: 16 French Foley  Specimens: #1 right bladder biopsy #2 left bladder biopsy to pathology.  Disposition: Patient stable to PACU. Mitomycin-C will be instilled in PACU.

## 2011-12-25 ENCOUNTER — Other Ambulatory Visit: Payer: Self-pay | Admitting: *Deleted

## 2011-12-25 ENCOUNTER — Encounter (HOSPITAL_COMMUNITY): Payer: Self-pay | Admitting: Urology

## 2011-12-25 DIAGNOSIS — I6529 Occlusion and stenosis of unspecified carotid artery: Secondary | ICD-10-CM

## 2011-12-27 ENCOUNTER — Telehealth: Payer: Self-pay | Admitting: Cardiology

## 2011-12-27 DIAGNOSIS — I714 Abdominal aortic aneurysm, without rupture: Secondary | ICD-10-CM

## 2011-12-27 NOTE — Telephone Encounter (Signed)
plz return call to pt [phone] regarding possibility of stomach ultra sound on same day.

## 2011-12-27 NOTE — Telephone Encounter (Signed)
Spoke with pt, she is past due for abdominal ultrasound. appt scheduled sameday as appt with dr Jens Som

## 2012-01-24 ENCOUNTER — Other Ambulatory Visit: Payer: Self-pay | Admitting: *Deleted

## 2012-01-24 MED ORDER — ISOSORBIDE MONONITRATE ER 30 MG PO TB24: 30.0000 mg | ORAL_TABLET | Freq: Every day | ORAL | Status: DC

## 2012-01-27 ENCOUNTER — Encounter: Payer: Self-pay | Admitting: Cardiology

## 2012-01-27 ENCOUNTER — Encounter (INDEPENDENT_AMBULATORY_CARE_PROVIDER_SITE_OTHER): Payer: Medicare Other

## 2012-01-27 ENCOUNTER — Ambulatory Visit (INDEPENDENT_AMBULATORY_CARE_PROVIDER_SITE_OTHER): Payer: Medicare Other | Admitting: Cardiology

## 2012-01-27 VITALS — BP 122/88 | HR 76 | Ht 65.0 in | Wt 171.0 lb

## 2012-01-27 DIAGNOSIS — I2510 Atherosclerotic heart disease of native coronary artery without angina pectoris: Secondary | ICD-10-CM

## 2012-01-27 DIAGNOSIS — R109 Unspecified abdominal pain: Secondary | ICD-10-CM

## 2012-01-27 DIAGNOSIS — I714 Abdominal aortic aneurysm, without rupture: Secondary | ICD-10-CM

## 2012-01-27 DIAGNOSIS — I4891 Unspecified atrial fibrillation: Secondary | ICD-10-CM

## 2012-01-27 DIAGNOSIS — I472 Ventricular tachycardia: Secondary | ICD-10-CM

## 2012-01-27 DIAGNOSIS — I10 Essential (primary) hypertension: Secondary | ICD-10-CM

## 2012-01-27 DIAGNOSIS — E785 Hyperlipidemia, unspecified: Secondary | ICD-10-CM

## 2012-01-27 DIAGNOSIS — I679 Cerebrovascular disease, unspecified: Secondary | ICD-10-CM

## 2012-01-27 DIAGNOSIS — I498 Other specified cardiac arrhythmias: Secondary | ICD-10-CM

## 2012-01-27 LAB — BASIC METABOLIC PANEL
BUN: 13 mg/dL (ref 6–23)
CO2: 33 mEq/L — ABNORMAL HIGH (ref 19–32)
Calcium: 9.2 mg/dL (ref 8.4–10.5)
GFR: 93.97 mL/min (ref >60.00)
Glucose, Bld: 85 mg/dL (ref 70–99)
Potassium: 3.7 mEq/L (ref 3.5–5.1)

## 2012-01-27 LAB — TSH: TSH: 1.22 u[IU]/mL (ref 0.35–5.50)

## 2012-01-27 LAB — HEPATIC FUNCTION PANEL
ALT: 42 U/L — ABNORMAL HIGH (ref 0–35)
AST: 38 U/L — ABNORMAL HIGH (ref 0–37)
Bilirubin, Direct: 0.2 mg/dL (ref 0.0–0.3)
Total Bilirubin: 0.7 mg/dL (ref 0.3–1.2)

## 2012-01-27 NOTE — Assessment & Plan Note (Signed)
Continue amiodarone. 

## 2012-01-27 NOTE — Assessment & Plan Note (Signed)
Blood pressure controlled. Continue present medications. 

## 2012-01-27 NOTE — Assessment & Plan Note (Signed)
Continue aspirin and statin. 

## 2012-01-27 NOTE — Patient Instructions (Addendum)
Your physician recommends that you schedule a follow-up appointment in: 3 MONTHS WITH DR CRENSHAW  Your physician recommends that you HAVE LAB WORK TODAY  

## 2012-01-27 NOTE — Assessment & Plan Note (Signed)
Continue aspirin and statin. Certainly worsening coronary disease could be contributing to dyspnea. However she would be high risk for any procedure and I think her dyspnea is more likely related to her severe lung disease.

## 2012-01-27 NOTE — Assessment & Plan Note (Signed)
Await results of followup ultrasound performed today.

## 2012-01-27 NOTE — Assessment & Plan Note (Signed)
Continue amiodarone. Recent chest x-ray unchanged. Check liver functions and TSH.

## 2012-01-27 NOTE — Progress Notes (Signed)
YNW:GNFAOZHY female with a history of CAD, hypertension, hyperlipidemia, SVT, AAA, COPD, bladder cancer and cerebrovascular disease. She presented to  [facility] in early February 2012 for a COPD exacerbation and possible diastolic heart failure. She was noted to have wide complex tachycardia that was felt to be ventricular tachycardia and she was transferred to [facility]. Cardiac catheterization on 04/06/10 demonstrated a totally occluded LAD with right to left collaterals, a nodular density in the prox CFX that occupied about 70% of the prox vessel, and preserved LVF. It was felt that her CAD should be treated medically. She was evaluated by Dr. Ladona Ridgel. It was felt that her ventricular tachycardia would also be treated medically with adjustments in her beta blocker therapy. Ultrasound 6/12 revealed 3.6 by 3.6 cm AAA; fu one year. The patient has previous attempt at carotid stent on the left. The procedure was unsuccessful due to anatomy. Patient had an episode of acute respiratory failure in March of 2013. She had SVT during that admission which was treated with amiodarone. Repeat echocardiogram in March of 2013 showed normal LV function. There was trace aortic insufficiency and mild left atrial enlargement. I last saw her in May of 2013. Since then, the patient does have dyspnea on exertion but there is no orthopnea, PND, pedal edema, palpitations, syncope or chest pain. Her dyspnea is worse compared to previous.   Current Outpatient Prescriptions  Medication Sig Dispense Refill  . acetaminophen-codeine (TYLENOL #3) 300-30 MG per tablet Take 1 tablet by mouth 3 (three) times daily.       Marland Kitchen amiodarone (PACERONE) 200 MG tablet Take 200 mg by mouth daily. AT NIGHT      . aspirin EC 81 MG tablet Take 81 mg by mouth at bedtime.      . Aspirin-Acetaminophen-Caffeine (GOODYS EXTRA STRENGTH) [phone] MG PACK Take 1 Package by mouth every 6 (six) hours as needed.      Marland Kitchen atorvastatin  (LIPITOR) 80 MG tablet Take 40 mg by mouth 2 (two) times daily.       . budesonide-formoterol (SYMBICORT) 160-4.5 MCG/ACT inhaler Inhale 2 puffs into the lungs 2 (two) times daily.      . clopidogrel (PLAVIX) 75 MG tablet Take 1 tablet (75 mg total) by mouth at bedtime.      Marland Kitchen diltiazem (CARDIZEM CD) 300 MG 24 hr capsule Take 300 mg by mouth daily. IN EVENING      . furosemide (LASIX) 20 MG tablet Take 20 mg by mouth every morning.      Marland Kitchen guaiFENesin (MUCINEX) 600 MG 12 hr tablet Take 1,200 mg by mouth daily.       Marland Kitchen ipratropium (ATROVENT) 0.02 % nebulizer solution Take 500 mcg by nebulization every 6 (six) hours as needed. For shortness of breath      . isosorbide mononitrate (IMDUR) 30 MG 24 hr tablet Take 1 tablet (30 mg total) by mouth at bedtime.  30 tablet  9  . levalbuterol (XOPENEX) 0.63 MG/3ML nebulizer solution Take 1 ampule by nebulization every 4 (four) hours as needed.      . nitroGLYCERIN (NITROSTAT) 0.4 MG SL tablet Place 0.4 mg under the tongue every 5 (five) minutes as needed. Chest pain      . potassium chloride SA (K-DUR,KLOR-CON) 20 MEQ tablet Take 20 mEq by mouth 2 (two) times daily.      . promethazine (PHENERGAN) 25 MG tablet Take 25 mg by mouth every 4 (four) hours as needed. For nausea      .  sulfamethoxazole-trimethoprim (BACTRIM) 400-80 MG per tablet Take 1 tablet by mouth 2 (two) times daily.  6 tablet  0  . travoprost, benzalkonium, (TRAVATAN) 0.004 % ophthalmic solution Place 1 drop into both eyes at bedtime.        No current facility-administered medications for this visit.   Facility-Administered Medications Ordered in Other Visits  Medication Dose Route Frequency Provider Last Rate Last Dose  . mitomycin (MUTAMYCIN) chemo injection 40 mg  40 mg Bladder Instillation Once Antony Haste, MD         Past Medical History  Diagnosis Date  . Hypertension   . Hyperlipidemia   . Cerebrovascular disease, unspecified   . Unspecified glaucoma(365.9)   .  Obesity, unspecified   . Coronary artery disease     cath 04/06/10: LAD occluded with R-L collats; med Rx WTC....probable V. Tach...med Rx  . Ventricular tachycardia   . SVT (supraventricular tachycardia)   . AAA (abdominal aortic aneurysm)   . COPD (chronic obstructive pulmonary disease) 2013    [facility]  . Shortness of breath     WITH ACTIVITY--USES OXYGEN AS NEEDED  2&1/2 L PER NASAL CANNUL  . Headache     PAST HX MIGRAINES  . Cancer     BLADDER CANCER  . Arthritis   . Complication of anesthesia     PT STATES SHE CAN NOT BE PUT TO SLEEP BECAUSE OF HER LUNG PROBLEMS    Past Surgical History  Procedure Date  . Cystoscopy   . Bladder tumor excision     resection of 5 bladder, and fulguration of 1 small bladder tumor  . Cystoscopy     cold cup bladder biopsy of five tumors, and fulguration of one tumor  . Lung surgery     Remote right lung  . Tubal ligation     Bilateral  . Cystoscopy with biopsy 12/24/2011    Procedure: CYSTOSCOPY WITH BIOPSY;  Surgeon: Antony Haste, MD;  Location: [facility] ORS;  Service: Urology;  Laterality: N/A;    History   Social History  . Marital Status: Married    Spouse Name: N/A    Number of Children: 2  . Years of Education: N/A   Occupational History  . Retired     Social History Main Topics  . Smoking status: Former Smoker -- 0.3 packs/day for 58 years    Types: Cigarettes    Quit date: 05/18/2011  . Smokeless tobacco: Never Used  . Alcohol Use: No  . Drug Use: No  . Sexually Active: No   Other Topics Concern  . Not on file   Social History Narrative   Lives in [place], [place] with husband.     ROS: no fevers or chills, productive cough, hemoptysis, dysphasia, odynophagia, melena, hematochezia, dysuria, hematuria, rash, seizure activity, orthopnea, PND, pedal edema, claudication. Remaining systems are negative.  Physical Exam: Well-developed chronically ill appearing in no acute distress.  Skin is warm and dry.    HEENT is normal.  Neck is supple.  Chest with diminished breath sounds throughout Cardiovascular exam is regular rate and rhythm.  Abdominal exam nontender or distended. No masses palpated. Extremities show no edema. neuro grossly intact  ECG sinus rhythm, right bundle branch block.

## 2012-01-27 NOTE — Assessment & Plan Note (Signed)
Continue statin. 

## 2012-01-28 ENCOUNTER — Telehealth: Payer: Self-pay | Admitting: Cardiology

## 2012-01-28 NOTE — Telephone Encounter (Signed)
Pt aware of results 

## 2012-01-28 NOTE — Telephone Encounter (Signed)
New Problem:    Patient returned the call about her labs.  Please call back.

## 2012-01-31 ENCOUNTER — Encounter: Payer: Self-pay | Admitting: Internal Medicine

## 2012-01-31 ENCOUNTER — Other Ambulatory Visit (INDEPENDENT_AMBULATORY_CARE_PROVIDER_SITE_OTHER): Payer: Medicare Other

## 2012-01-31 ENCOUNTER — Ambulatory Visit (INDEPENDENT_AMBULATORY_CARE_PROVIDER_SITE_OTHER): Payer: Medicare Other | Admitting: Internal Medicine

## 2012-01-31 ENCOUNTER — Ambulatory Visit (INDEPENDENT_AMBULATORY_CARE_PROVIDER_SITE_OTHER)
Admission: RE | Admit: 2012-01-31 | Discharge: 2012-01-31 | Disposition: A | Discharge status: Home / Self Care | Payer: Medicare Other | Source: Ambulatory Visit | Attending: Internal Medicine | Admitting: Internal Medicine

## 2012-01-31 VITALS — BP 128/78 | HR 81 | Temp 98.2°F | Ht 65.0 in | Wt 171.0 lb

## 2012-01-31 DIAGNOSIS — R0989 Other specified symptoms and signs involving the circulatory and respiratory systems: Secondary | ICD-10-CM

## 2012-01-31 DIAGNOSIS — R0609 Other forms of dyspnea: Secondary | ICD-10-CM

## 2012-01-31 DIAGNOSIS — Z79899 Other long term (current) drug therapy: Secondary | ICD-10-CM | POA: Insufficient documentation

## 2012-01-31 DIAGNOSIS — J9610 Chronic respiratory failure, unspecified whether with hypoxia or hypercapnia: Secondary | ICD-10-CM

## 2012-01-31 DIAGNOSIS — J4489 Other specified chronic obstructive pulmonary disease: Secondary | ICD-10-CM

## 2012-01-31 DIAGNOSIS — Z9189 Other specified personal risk factors, not elsewhere classified: Secondary | ICD-10-CM

## 2012-01-31 DIAGNOSIS — R0600 Dyspnea, unspecified: Secondary | ICD-10-CM

## 2012-01-31 DIAGNOSIS — J449 Chronic obstructive pulmonary disease, unspecified: Secondary | ICD-10-CM

## 2012-01-31 MED ORDER — PREDNISONE (PAK) 10 MG PO TABS: ORAL_TABLET | ORAL | Status: DC

## 2012-01-31 NOTE — Patient Instructions (Addendum)
Prednisone 10 mg take  4 each am x 2 days,   2 each am x 2 days,  1 each am x2days and stop   Please remember to go to the lab and x-ray department downstairs for your tests - we will call you with the results when they are available.  Please see patient coordinator before you leave today  to arrange liquid 02  at 2lpm if your company will agree to provide it that way     Work on inhaler technique:  relax and gently blow all the way out then take a nice smooth deep breath back in, triggering the inhaler at same time you start breathing in.  Hold for up to 5 seconds if you can.  Rinse and gargle with water when done   If your mouth or throat starts to bother you,   I suggest you time the inhaler to your dental care and after using the inhaler(s) brush teeth and tongue with a baking soda containing toothpaste and when you rinse this out, gargle with it first to see if this helps your mouth and throat.     See Tammy NP w/in 2 weeks with all your medications(the sooner the better), even over the counter meds, separated in two separate bags, the ones you take no matter what vs the ones you stop once you feel better and take only as needed when you feel you need them.   Tammy  will generate for you a new user friendly medication calendar that will put [place] all on the same page re: your medication use.     Without this process, it simply isn't possible to assure that we are providing  your outpatient care  with  the attention to detail we feel you deserve.   If we cannot assure that you're getting that kind of care,  then we cannot manage your problem effectively from this clinic.  Once you have seen Tammy and we are sure that we're all on the same page with your medication use she will arrange follow up with me- I will be in the [place] office the first Tuesday of each month if that is more convenient for you

## 2012-01-31 NOTE — Assessment & Plan Note (Addendum)
-   PFT's 12/10/2002 FEV1 .85(37%) ratio 47 and 11 % better p B2, DLC0 42%  - HFA 50% p coaching 01/31/2012   DDX of  difficult airways managment all start with A and  include Adherence, Ace Inhibitors, Acid Reflux, Active Sinus Disease, Alpha 1 Antitripsin deficiency, Anxiety masquerading as Airways dz,  ABPA,  allergy(esp in young), Aspiration (esp in elderly), Adverse effects of meds esp DPI,  Active smokers, plus two Bs  = Bronchiectasis and Beta blocker use..and one C= CHF  Adherence is always the initial "prime suspect" and is a multilayered concern that requires a "trust but verify" approach in every patient - starting with knowing how to use medications, especially inhalers, correctly, keeping up with refills(note symbicort empty) and understanding the fundamental difference between maintenance and prns vs those medications only taken for a very short course and then stopped and not refilled.  The proper method of use, as well as anticipated side effects, of a metered-dose inhaler are discussed and demonstrated to the patient. Improved effectiveness after extensive coaching during this visit to a level of approximately  50% no better so may need to change to [facility] or advair next ov.  ? Adverse effect of meds ? Amiodarone > check esr  ? chf > check bnp

## 2012-01-31 NOTE — Progress Notes (Signed)
Subjective:    Patient ID: [name] [name], female    DOB: Feb 03, 1941    MRN: [id number]  Brief patient profile:  71 yowf quit smoking  04/2011 dx'd with stage III COPD in 2004.   HPI  06/24/2011 Post Hospital follow up  Last seen in office 01/2010.    COPD-oxygen dependant 2.5 liters, admitted 05/15/11 for acute resp distress. She had a prolonged hospitalization/critical illness for acute on chronic resp failure secondary to COPD exacerbation w/ initial failure of BIPAP requiring intubation , extubated on 3/22. She required aggressive pulmonary hygiene, IV ABX and Steroids. Stay was complicated by PVST requiring med management. She had a  slow recovery with discharge to rehab on 06/04/11 with deconditioning and anxiety. Required high flow O2 with 5 l rest and 6 l with activity.   Since discharge feeling better , breathing back to her baseline.  She is now at home , discharged from IP rehab on 06/19/11.  Currently on Symbicort Twice daily.  Taking Atrovent NEB Twice daily   Taking xopenex neb Twice daily   No smoking since discharge.  On steroid taper, has few days left.  Now on 3 l/m continuously .  Has home health nursing at home  rec Continue on Symbicort 2 puffs twice daily, brush rinse and gargle after use. Increase ipratropium nebulizer 4 times daily. Change Xopenex nebulizer to as needed only for shortness of breath and wheezing. We are very proud of you for quitting smoking. Keep up the good work.    07/18/2011 f/u ov/[name] [name] cc w/c bound due to weakness and 02 3lpm 24 hours per day. No cough, confused with meds/ names (only used saba so far on day of ov, not yet gotten around to symbicort) but seems to be getting along ok at this point.   rec Symbicort Take 2 puffs first thing in am and then another 2 puffs about 12 hours later.  Work on inhaler technique:   .    Only use your albuterol as a rescue medication  08/26/2011 Follow up and med calendar  We reviewed all her meds and organized  them into a med calendar w/ pt education  She feels she is at her baseline. W/ no flare of cough or dyspnea.  rec No change rx, follow med calendar  10/25/2011 f/u ov/[name] [name] cc sob across parking lot to building no longer using 02 consistently doesn't really think it improves her activity tolerance. rec symbicort  Take 2 puffs first thing in am and then another 2 puffs about 12 hours later.  Ok to leave off the 02 at rest but you should always wear it at bedtime and as needed during the day with activity Work on inhaler technique   01/31/2012 f/u ov/[name] [name] cc worse x sev weeks breathing worse with any acitivity x @ rest, more need for 02,  Has calendar but not clear on how to use it, using ipatropium prn, poor hfa and symbicort inhaler empty.  No obvious daytime variabilty or assoc excess cough/ mucus production or cp or chest tightness, subjective wheeze overt sinus or hb symptoms. No unusual exp hx     Sleeping ok without nocturnal  or early am exacerbation  of respiratory  c/o's or need for noct saba. Also denies any obvious fluctuation of symptoms with weather or environmental changes or other aggravating or alleviating factors except as outlined above   ROS  The following are not active complaints unless bolded sore throat, dysphagia, dental problems, itching, sneezing,  nasal congestion or excess/ purulent secretions, ear ache,   fever, chills, sweats, unintended wt loss, pleuritic or exertional cp, hemoptysis,  orthopnea pnd or leg swelling, presyncope, palpitations, heartburn, abdominal pain, anorexia, nausea, vomiting, diarrhea  or change in bowel or urinary habits, change in stools or urine, dysuria,hematuria,  rash, arthralgias, visual complaints, headache, numbness weakness or ataxia or problems with walking or coordination,  change in mood/affect or memory.          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)  HYPERLIPIDEMIA (ICD-272.4)  HYPERTENSION  (ICD-401.9)  CEREBROVASCULAR DISEASE (ICD-437.9)  - 80% R Carotid stenosis - see note by Bradham11/16/11  GLAUCOMA (ICD-365.9)  OBESITY (ICD-278.00)  COPD (ICD-496)  - PFT's 12/10/2002 FEV1 .85(37%) ratio 47 and 11 % better p B2, DLC0 42%  - Walking sats January 25, 2010 > desat even on 2lpm > rec 2.5 lpm 24 h per day                   Objective:   Physical Exam  Wt Readings from Last 3 Encounters:  01/31/12 171 lb (77.565 kg)  01/27/12 171 lb (77.565 kg)  12/17/11 167 lb (75.751 kg)    GEN: elderly w/c bound, chronically ill appearing     HEENT:  [place]/AT,  EACs-clear, TMs-wnl, NOSE-clear, THROAT-clear, no lesions, no postnasal drip or exudate noted.   NECK:  Supple w/ fair ROM; no JVD; normal carotid impulses w/o bruits; no thyromegaly or nodules palpated; no lymphadenopathy.  RESP  Distant  BS  w/o, wheezes/ rales/ or rhonchi.no accessory muscle use, no dullness to percussion  CARD:  RRR, no m/r/g  , no peripheral edema, pulses intact, no cyanosis or clubbing.  GI:   Soft & nt; nml bowel sounds; no organomegaly or masses detected.  Musco: Warm bil, no deformities or joint swelling noted.     CXR  01/31/2012 : COPD and cardiomegaly without acute disease            Assessment & Plan:

## 2012-01-31 NOTE — Assessment & Plan Note (Signed)
-  Walking sats January 25, 2010 > desat even on 2lpm   -10/25/2011  Sat 90% at rest >  Walked 2lpm x one lap @ 185 stopped due to  Sob/ fatigue but no desat so changed to ok to leave off at rest  Increased 02 needs ? Reason > change 02 to 2lpm 24 h per day for now

## 2012-02-05 ENCOUNTER — Telehealth: Payer: Self-pay | Admitting: Internal Medicine

## 2012-02-05 NOTE — Progress Notes (Signed)
Quick Note:  Pt aware of results. ______ 

## 2012-02-05 NOTE — Telephone Encounter (Signed)
Notes Recorded by Nyoka Cowden, MD on 01/31/2012 at 8:19 PM Call patient : Studies are unremarkable, no change in recs   Pt aware of results.

## 2012-02-10 ENCOUNTER — Encounter: Payer: Self-pay | Admitting: Adult Health

## 2012-02-10 ENCOUNTER — Ambulatory Visit (INDEPENDENT_AMBULATORY_CARE_PROVIDER_SITE_OTHER): Payer: Medicare Other | Admitting: Adult Health

## 2012-02-10 VITALS — BP 130/82 | HR 80 | Temp 98.1°F | Ht 66.0 in | Wt 174.0 lb

## 2012-02-10 DIAGNOSIS — J449 Chronic obstructive pulmonary disease, unspecified: Secondary | ICD-10-CM

## 2012-02-10 MED ORDER — LEVALBUTEROL HCL 0.63 MG/3ML IN NEBU: 0.6300 mg | INHALATION_SOLUTION | RESPIRATORY_TRACT | Status: DC | PRN

## 2012-02-10 NOTE — Progress Notes (Signed)
Subjective:    Patient ID: [name] [name], female    DOB: [birth year]    MRN: [id number]  Brief patient profile:  71 yowf quit smoking  04/2011 dx'd with stage III COPD in 2004.  HPI 06/24/2011 Post Hospital follow up  Last seen in office 01/2010.    COPD-oxygen dependant 2.5 liters, admitted 05/15/11 for acute resp distress. She had a prolonged hospitalization/critical illness for acute on chronic resp failure secondary to COPD exacerbation w/ initial failure of BIPAP requiring intubation , extubated on 3/22. She required aggressive pulmonary hygiene, IV ABX and Steroids. Stay was complicated by PVST requiring med management. She had a  slow recovery with discharge to rehab on 06/04/11 with deconditioning and anxiety. Required high flow O2 with 5 l rest and 6 l with activity.   Since discharge feeling better , breathing back to her baseline.  She is now at home , discharged from IP rehab on 06/19/11.  Currently on Symbicort Twice daily.  Taking Atrovent NEB Twice daily   Taking xopenex neb Twice daily   No smoking since discharge.  On steroid taper, has few days left.  Now on 3 l/m continuously .  Has home health nursing at home  rec Continue on Symbicort 2 puffs twice daily, brush rinse and gargle after use. Increase ipratropium nebulizer 4 times daily. Change Xopenex nebulizer to as needed only for shortness of breath and wheezing. We are very proud of you for quitting smoking. Keep up the good work.    07/18/2011 f/u ov/Wert cc w/c bound due to weakness and 02 3lpm 24 hours per day. No cough, confused with meds/ names (only used saba so far on day of ov, not yet gotten around to symbicort) but seems to be getting along ok at this point.   rec Symbicort Take 2 puffs first thing in am and then another 2 puffs about 12 hours later.  Work on inhaler technique:   .    Only use your albuterol as a rescue medication  08/26/2011 Follow up and med calendar  We reviewed all her meds and organized them  into a med calendar w/ pt education  She feels she is at her baseline. W/ no flare of cough or dyspnea.  rec No change rx, follow med calendar  10/25/2011 f/u ov/Wert cc sob across parking lot to building no longer using 02 consistently doesn't really think it improves her activity tolerance. rec symbicort  Take 2 puffs first thing in am and then another 2 puffs about 12 hours later.  Ok to leave off the 02 at rest but you should always wear it at bedtime and as needed during the day with activity Work on inhaler technique   01/31/2012 f/u ov/Wert cc worse x sev weeks breathing worse with any acitivity x @ rest, more need for 02,  Has calendar but not clear on how to use it, using ipatropium prn, poor hfa and symbicort inhaler empty.  No obvious daytime variabilty or assoc excess cough/ mucus production or cp or chest tightness, subjective wheeze overt sinus or hb symptoms. No unusual exp hx   >>pred taper   02/10/2012 Follow up and med calendar  Returns for follow up and med review.  We reveiwed all her meds and organized them into med calendar w/ pt educatiion  Feels better from last ov , tx w/ steroid taper for COPD flare      No fever or chest pain .  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)  HYPERLIPIDEMIA (ICD-272.4)  HYPERTENSION (ICD-401.9)  CEREBROVASCULAR DISEASE (ICD-437.9)  - 80% R Carotid stenosis - see note by Bradham11/16/11  GLAUCOMA (ICD-365.9)  OBESITY (ICD-278.00)  COPD (ICD-496)  - PFT's 12/10/2002 FEV1 .85(37%) ratio 47 and 11 % better p B2, DLC0 42%  - Walking sats January 25, 2010 > desat even on 2lpm > rec 2.5 lpm 24 h per day       ROS Constitutional:   No  weight loss, night sweats,  Fevers, chills + fatigue, or  lassitude.  HEENT:   No headaches,  Difficulty swallowing,  Tooth/dental problems, or  Sore throat,                No sneezing, itching, ear ache,  +nasal congestion, post nasal drip,   CV:  No chest  pain,  Orthopnea, PND, swelling in lower extremities, anasarca, dizziness, palpitations, syncope.   GI  No heartburn, indigestion, abdominal pain, nausea, vomiting, diarrhea, change in bowel habits, loss of appetite, bloody stools.   Resp:   No coughing up of blood.     No chest wall deformity  Skin: no rash or lesions.  GU: no dysuria, change in color of urine, no urgency or frequency.  No flank pain, no hematuria   MS:  No joint pain or swelling.  No decreased range of motion.  No back pain.  Psych:  No change in mood or affect. No depression or anxiety.  No memory loss.                 Objective:   Physical Exam     GEN: elderly w/c bound, chronically ill appearing     HEENT:  [place]/AT,  EACs-clear, TMs-wnl, NOSE-clear, THROAT-clear, no lesions, no postnasal drip or exudate noted.   NECK:  Supple w/ fair ROM; no JVD; normal carotid impulses w/o bruits; no thyromegaly or nodules palpated; no lymphadenopathy.  RESP  Distant  BS  w/o, wheezes/ rales/ or rhonchi.no accessory muscle use, no dullness to percussion  CARD:  RRR, no m/r/g  , no peripheral edema, pulses intact, no cyanosis or clubbing.  GI:   Soft & nt; nml bowel sounds; no organomegaly or masses detected.  Musco: Warm bil, no deformities or joint swelling noted.     CXR  01/31/2012 : COPD and cardiomegaly without acute disease            Assessment & Plan:

## 2012-02-10 NOTE — Patient Instructions (Addendum)
Follow med calendar closely and bring to each visit.  May increase Oxygen 4 l/m with walking.  follow up Dr. Sherene Sires  In 2 months and As needed

## 2012-02-11 NOTE — Assessment & Plan Note (Signed)
Compensated on present reigmen  Plan  Follow med calendar closely and bring to each visit.  May increase Oxygen 4 l/m with walking.  follow up Dr. Sherene Sires  In 2 months and As needed

## 2012-02-12 ENCOUNTER — Telehealth: Payer: Self-pay | Admitting: Adult Health

## 2012-02-12 ENCOUNTER — Telehealth: Payer: Self-pay | Admitting: Internal Medicine

## 2012-02-12 NOTE — Telephone Encounter (Signed)
[facility] did receive her xopenex neb but medicare does not cover much through them. Alternative is too send to another pharmacy.  lmomtcb x1

## 2012-02-12 NOTE — Telephone Encounter (Signed)
I spoke with pt and she stated it was not an inhaler it was for her xopenex and she did pick this up. She needed nothing further

## 2012-02-12 NOTE — Telephone Encounter (Signed)
Duplicate message. 

## 2012-02-13 NOTE — Telephone Encounter (Signed)
An rx was sent to [facility] pharmacy for xopenex neb but pt states that on her med calender that was done yesterday it has xopenex inhaler. Pt wants to know should she be using xopenex inhaler or nebs? If nebs then we will need to send to a different pharmacy. Carron Curie, CMA

## 2012-02-13 NOTE — Telephone Encounter (Signed)
Called spoke with patient who needed clarification on her nebs > atrovent was changed from BID to QID scheduled.  Her xopenex was changed from scheduled to PRN only.  Pt verbalized her understanding on this but states that [facility] informed her that medicare does not reimburse them for xopenex; that she could fill this thru [facility].  However, pt does not want to use [facility] as they do not deliver.  Advised pt will speak with TP and call her back tomorrow.  Pt okay with this.  Called [facility] spoke with Caryn Bee (pharmacist).  He states that medicare does not pay for xopenex AT ALL; unable to do PA.  Copay for xopenex hfa is $50.75.    Per TP: pt has documented allergy/intolerance to albuterol and hx of tachycardia; we cannot prescribe albuterol.  Our hope is that she will not use this often as it should only be used PRN for rescue.

## 2012-02-14 MED ORDER — LEVALBUTEROL TARTRATE 45 MCG/ACT IN AERO: 1.0000 | INHALATION_SPRAY | RESPIRATORY_TRACT | Status: DC | PRN

## 2012-02-14 MED ORDER — LEVALBUTEROL TARTRATE 45 MCG/ACT IN AERO: 2.0000 | INHALATION_SPRAY | RESPIRATORY_TRACT | Status: DC | PRN

## 2012-02-14 NOTE — Telephone Encounter (Signed)
Shanda Bumps, the pharmacist would like to clarify with you regarding medications for this pt since he spoke with you last night. Thanks!

## 2012-02-14 NOTE — Telephone Encounter (Signed)
Called [facility] spoke with pharmacist Caryn Bee and informed that pt is okay with the xopenex hfa and is aware that the cost will be out of pocket.  Advised that pt was provided with a sample and that she stated she will contact her PCP for more if needed and that pt was told to call if a refill was needed.  Per Caryn Bee, a script can be verbally given so that if pt should need a refill after hours then she will have 1 on file.  VO given for xopenex hfa #1 2 puffs q4h prn wheezing/SOB with 5 refills.  Per Caryn Bee, this will be placed in patient's profile, NOT filled.  Nothing further needed at this time; will sign off.

## 2012-02-14 NOTE — Telephone Encounter (Signed)
Called spoke with patient, informed her of the recs stated by TP below.  Pt stated she will still be unable to afford the xopenex hfa and requested a sample (#1 provided and documented).  Told pt that I will call the pharmacy but pt does not want a new rx sent as she will still be unable to afford the xopenex hfa.  Pt stated that she will contact her PCP for samples.  Advised pt that I will still update her pharmacy and to call our office if we can do anything further for her.  ATC Caryn Bee at [facility], pharmacist was unavailable.  WCB.

## 2012-02-14 NOTE — Telephone Encounter (Signed)
Duplicate message. 

## 2012-02-14 NOTE — Telephone Encounter (Signed)
Caryn Bee from [facility] called back.  Call him back @ [phone] ext [phone]. Leanora Ivanoff

## 2012-02-26 DIAGNOSIS — G510 Bell's palsy: Secondary | ICD-10-CM

## 2012-02-26 HISTORY — DX: Bell's palsy: G51.0

## 2012-04-03 ENCOUNTER — Encounter: Payer: Self-pay | Admitting: Surgery

## 2012-04-06 ENCOUNTER — Other Ambulatory Visit: Payer: Self-pay | Admitting: *Deleted

## 2012-04-06 ENCOUNTER — Encounter: Payer: Self-pay | Admitting: Surgery

## 2012-04-06 ENCOUNTER — Ambulatory Visit (INDEPENDENT_AMBULATORY_CARE_PROVIDER_SITE_OTHER): Payer: Medicare Other | Admitting: Surgery

## 2012-04-06 ENCOUNTER — Other Ambulatory Visit (INDEPENDENT_AMBULATORY_CARE_PROVIDER_SITE_OTHER): Payer: Medicare Other | Admitting: *Deleted

## 2012-04-06 VITALS — BP 150/75 | HR 78 | Resp 16 | Ht 65.0 in | Wt 170.0 lb

## 2012-04-06 DIAGNOSIS — I6529 Occlusion and stenosis of unspecified carotid artery: Secondary | ICD-10-CM

## 2012-04-06 DIAGNOSIS — R200 Anesthesia of skin: Secondary | ICD-10-CM | POA: Insufficient documentation

## 2012-04-06 DIAGNOSIS — R209 Unspecified disturbances of skin sensation: Secondary | ICD-10-CM

## 2012-04-06 NOTE — Progress Notes (Signed)
[facility]   Patient name: [name] [name] MRN: [id number] DOB: 12-18-1940 Sex: female     Chief Complaint  Patient presents with  . Carotid    6 month f/up with vascular Lab study.  C/O Right face numbness, duration 2-3 weeks. Question Bell Palsy.    HISTORY OF PRESENT ILLNESS: The patient is back today for followup of her left carotid stenosis. Because of her multiple comorbidities including cardiac and pulmonary issues, she has been deemed not an operative candidate for carotid endarterectomy. I have attempted carotid stenting, but do to her anatomy she was also not a candidate for carotid stenting. I have been following her for a high-grade left carotid stenosis which has been asymptomatic. The patient reports developing numbness in her right face as well as drooping of her right eyelid and right side of her mouth. This began about a month ago. She has been diagnosed with Bell's palsy. She reports no numbness or weakness in her arms or legs. She reports no slurred speech or amaurosis fugax.  In April of last year the patient had a very complicated hospitalization which required intubation. She has not smoked since that time she continues to wear home oxygen  Past Medical History  Diagnosis Date  . Hypertension   . Hyperlipidemia   . Cerebrovascular disease, unspecified   . Unspecified glaucoma(365.9)   . Obesity, unspecified   . Coronary artery disease     cath 04/06/10: LAD occluded with R-L collats; med Rx WTC....probable V. Tach...med Rx  . Ventricular tachycardia   . SVT (supraventricular tachycardia)   . AAA (abdominal aortic aneurysm)   . COPD (chronic obstructive pulmonary disease) 2013    [facility]  . Shortness of breath     WITH ACTIVITY--USES OXYGEN AS NEEDED  2&1/2 L PER NASAL CANNUL  . Headache     PAST HX MIGRAINES  . Arthritis   . Complication of anesthesia     PT STATES SHE CAN NOT BE PUT TO SLEEP BECAUSE OF HER LUNG PROBLEMS  .  Carotid artery occlusion   . 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 Cystoscopy    . Bladder tumor excision      resection of 5 bladder, and fulguration of 1 small bladder tumor  . Cystoscopy      cold cup bladder biopsy of five tumors, and fulguration of one tumor  . Lung surgery      Remote right lung  . Tubal ligation      Bilateral  . Cystoscopy with biopsy  12/24/2011    Procedure: CYSTOSCOPY WITH BIOPSY;  Surgeon: Antony Haste, MD;  Location: [facility];  Service: Urology;  Laterality: N/A;    History   Social History  . Marital Status: Married    Spouse Name: N/A    Number of Children: 2  . Years of Education: N/A   Occupational History  . Retired     Social History Main Topics  . Smoking status: Former Smoker -- 0.30 packs/day for 58 years    Types: Cigarettes    Quit date: 05/18/2011  . Smokeless tobacco: Never Used  . Alcohol Use: No  . Drug Use: No  . Sexually Active: No   Other Topics Concern  . Not on file   Social History Narrative   Lives in [place], [place] with husband.     Family History  Problem Relation Age  of Onset  . Heart disease Mother 40  . Heart disease Father   . Hyperlipidemia Father   . Hypertension Father   . Diabetes Daughter     Allergies as of 04/06/2012 - Review Complete 04/06/2012  Allergen Reaction Noted  . Albuterol  06/24/2011  . Sulfa antibiotics Nausea Only 04/06/2012    Current Outpatient Prescriptions on File Prior to Visit  Medication Sig Dispense Refill  . acetaminophen-codeine (TYLENOL #3) 300-30 MG per tablet Take 1 tablet by mouth every 8 (eight) hours as needed.       Marland Kitchen amiodarone (PACERONE) 200 MG tablet Take 200 mg by mouth at bedtime.       Marland Kitchen aspirin EC 81 MG tablet Take 81 mg by mouth at bedtime.      Marland Kitchen atorvastatin (LIPITOR) 80 MG tablet 1/2 tab by mouth twice daily      . budesonide-formoterol (SYMBICORT) 160-4.5 MCG/ACT inhaler Inhale 2 puffs  into the lungs 2 (two) times daily.      . clopidogrel (PLAVIX) 75 MG tablet Take 1 tablet (75 mg total) by mouth at bedtime.      Marland Kitchen diltiazem (CARDIZEM CD) 300 MG 24 hr capsule Take 300 mg by mouth daily. IN EVENING      . furosemide (LASIX) 20 MG tablet Take 20 mg by mouth every morning.      Marland Kitchen guaiFENesin (MUCINEX) 600 MG 12 hr tablet Take 600 mg by mouth every 12 (twelve) hours as needed.       Marland Kitchen ipratropium (ATROVENT) 0.02 % nebulizer solution Take 500 mcg by nebulization 4 (four) times daily. For shortness of breath      . isosorbide mononitrate (IMDUR) 30 MG 24 hr tablet Take 1 tablet (30 mg total) by mouth at bedtime.  30 tablet  9  . levalbuterol (XOPENEX HFA) 45 MCG/ACT inhaler Inhale 2 puffs into the lungs every 4 (four) hours as needed for wheezing.  8.4 g  0  . nitroGLYCERIN (NITROSTAT) 0.4 MG SL tablet Place 0.4 mg under the tongue every 5 (five) minutes as needed. Chest pain      . potassium chloride SA (K-DUR,KLOR-CON) 20 MEQ tablet 1/2 tab by mouth twice daily      . promethazine (PHENERGAN) 25 MG tablet Take 25 mg by mouth every 4 (four) hours as needed. For nausea      . travoprost, benzalkonium, (TRAVATAN) 0.004 % ophthalmic solution Place 1 drop into both eyes at bedtime.       . Aspirin-Acetaminophen-Caffeine (GOODYS EXTRA STRENGTH) 4341480935 MG PACK As directed      . levalbuterol (XOPENEX HFA) 45 MCG/ACT inhaler Inhale 1-2 puffs into the lungs every 4 (four) hours as needed for wheezing.  1 Inhaler  5  . predniSONE (STERAPRED UNI-PAK) 10 MG tablet Prednisone 10 mg take  4 each am x 2 days,   2 each am x 2 days,  1 each am x2days and stop  14 tablet  0   Current Facility-Administered Medications on File Prior to Visit  Medication Dose Route Frequency Provider Last Rate Last Dose  . mitomycin (MUTAMYCIN) chemo injection 40 mg  40 mg Bladder Instillation Once Antony Haste, MD         REVIEW OF SYSTEMS: Cardiovascular: No chest pain, chest pressure,  palpitations, orthopnea, or dyspnea on exertion. No claudication or rest pain,  No history of DVT or phlebitis. Pulmonary: Positive for shortness of breath with exertion, COPD Neurologic: Right facial numbness and drooping of her right  eyelid and right side of her mouth Hematologic: No bleeding problems or clotting disorders. Musculoskeletal: No joint pain or joint swelling. Gastrointestinal: No blood in stool or hematemesis Genitourinary: No dysuria or hematuria. Psychiatric:: No history of major depression. Integumentary: No rashes or ulcers. Constitutional: No fever or chills.  PHYSICAL EXAMINATION:   Vital signs are BP 150/75  Pulse 78  Resp 16  Ht 5\' 5"  (1.651 m)  Wt 170 lb (77.111 kg)  BMI 28.29 kg/m2  SpO2 97% General: The patient appears their stated age. HEENT:  No gross abnormalities Pulmonary:  Non labored breathing Musculoskeletal: There are no major deformities. Neurologic: Right facial droop. Skin: There are no ulcer or rashes noted. Psychiatric: The patient has normal affect. Cardiovascular: There is a regular rate and rhythm without significant murmur appreciated. No carotid bruit   Diagnostic Studies Carotid ultrasound today is essentially unchanged from her prior study. She is a high-grade left carotid stenosis and minimal right-sided stenosis  Assessment: Questionable symptomatic left carotid stenosis Plan: It is unclear to me whether this is Bell's palsy or a stroke which has affected the right side of her face. I think it is important to make the distinction, because it will effect the way I manage her left carotid stenosis. The best way to evaluate this is with an MRI of the brain. I tried to schedule this for tomorrow, but the patient would like to wait until next week. She is been scheduled for an MRI of the brain next Monday in to see me following that. If her MRI shows evidence of a left brain stroke, I will need to consider proceeding with left carotid  endarterectomy. Of course she will be very high risk for this procedure. Out also like Dr. Sherene Sires and Dr. Jens Som to lay in on her suitability for general anesthesia. If there is no evidence of stroke on MRI, then I would presume this is an episode of Bell's palsy. I will continue to follow her carotid disease by ultrasound.  Jorge Ny, M.D. [facility] [place] Office: [phone] Pager:  [phone]

## 2012-04-10 ENCOUNTER — Encounter: Payer: Self-pay | Admitting: Surgery

## 2012-04-13 ENCOUNTER — Other Ambulatory Visit: Payer: Medicare Other

## 2012-04-13 ENCOUNTER — Ambulatory Visit (HOSPITAL_COMMUNITY): Payer: Medicare Other

## 2012-04-13 ENCOUNTER — Encounter: Payer: Self-pay | Admitting: Surgery

## 2012-04-13 ENCOUNTER — Ambulatory Visit (INDEPENDENT_AMBULATORY_CARE_PROVIDER_SITE_OTHER): Payer: Medicare Other | Admitting: Surgery

## 2012-04-13 VITALS — BP 123/73 | HR 95 | Resp 18 | Ht 65.0 in | Wt 179.0 lb

## 2012-04-13 DIAGNOSIS — I6529 Occlusion and stenosis of unspecified carotid artery: Secondary | ICD-10-CM

## 2012-04-13 NOTE — Progress Notes (Signed)
Vascular and Vein Specialist of [facility]   Patient name: [name] [name] MRN: [id number] DOB: 1940/05/19 Sex: female     Chief Complaint  Patient presents with  . Carotid    F/U MRI  TO DISCUSS REPORT    HISTORY OF PRESENT ILLNESS: The patient is back today to discuss the results of her MRI. I have been following her for high-grade right carotid stenosis. She was in the office last week she stated that she had been diagnosed with Bell's palsy. She had a right facial droop. I was concerned that this could also be a stroke. If that were the case, that would change the management of her carotid disease. She feels as if she has had a slight improvement in her symptoms.  Past Medical History  Diagnosis Date  . Hypertension   . Hyperlipidemia   . Cerebrovascular disease, unspecified   . Unspecified glaucoma(365.9)   . Obesity, unspecified   . Coronary artery disease     cath 04/06/10: LAD occluded with R-L collats; med Rx WTC....probable V. Tach...med Rx  . Ventricular tachycardia   . SVT (supraventricular tachycardia)   . AAA (abdominal aortic aneurysm)   . COPD (chronic obstructive pulmonary disease) 2013    [facility]  . Shortness of breath     WITH ACTIVITY--USES OXYGEN AS NEEDED  2&1/2 L PER NASAL CANNUL  . Headache     PAST HX MIGRAINES  . Arthritis   . Complication of anesthesia     PT STATES SHE CAN NOT BE PUT TO SLEEP BECAUSE OF HER LUNG PROBLEMS  . Carotid artery occlusion   . 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 Cystoscopy    . Bladder tumor excision      resection of 5 bladder, and fulguration of 1 small bladder tumor  . Cystoscopy      cold cup bladder biopsy of five tumors, and fulguration of one tumor  . Lung surgery      Remote right lung  . Tubal ligation      Bilateral  . Cystoscopy with biopsy  12/24/2011    Procedure: CYSTOSCOPY WITH BIOPSY;  Surgeon: Antony Haste, MD;  Location: [facility];   Service: Urology;  Laterality: N/A;    History   Social History  . Marital Status: Married    Spouse Name: N/A    Number of Children: 2  . Years of Education: N/A   Occupational History  . Retired     Social History Main Topics  . Smoking status: Former Smoker -- 0.30 packs/day for 58 years    Types: Cigarettes    Quit date: 05/18/2011  . Smokeless tobacco: Never Used  . Alcohol Use: No  . Drug Use: No  . Sexually Active: No   Other Topics Concern  . Not on file   Social History Narrative   Lives in [place], [place] with husband.     Family History  Problem Relation Age of Onset  . Heart disease Mother 44  . Heart disease Father   . Hyperlipidemia Father   . Hypertension Father   . Diabetes Daughter     Allergies as of 04/13/2012 - Review Complete 04/13/2012  Allergen Reaction Noted  . Albuterol  06/24/2011  . Sulfa antibiotics Nausea Only 04/06/2012    Current Outpatient Prescriptions on File Prior to Visit  Medication Sig Dispense Refill  . acetaminophen-codeine (TYLENOL #3) 300-30 MG per tablet  Take 1 tablet by mouth every 8 (eight) hours as needed.       Marland Kitchen amiodarone (PACERONE) 200 MG tablet Take 200 mg by mouth at bedtime.       Marland Kitchen aspirin EC 81 MG tablet Take 81 mg by mouth at bedtime.      . Aspirin-Acetaminophen-Caffeine (GOODYS EXTRA STRENGTH) [phone] MG PACK As directed      . atorvastatin (LIPITOR) 80 MG tablet 1/2 tab by mouth twice daily      . budesonide-formoterol (SYMBICORT) 160-4.5 MCG/ACT inhaler Inhale 2 puffs into the lungs 2 (two) times daily.      . clopidogrel (PLAVIX) 75 MG tablet Take 1 tablet (75 mg total) by mouth at bedtime.      Marland Kitchen diltiazem (CARDIZEM CD) 300 MG 24 hr capsule Take 300 mg by mouth daily. IN EVENING      . furosemide (LASIX) 20 MG tablet Take 20 mg by mouth every morning.      Marland Kitchen guaiFENesin (MUCINEX) 600 MG 12 hr tablet Take 600 mg by mouth every 12 (twelve) hours as needed.       Marland Kitchen ipratropium (ATROVENT) 0.02 %  nebulizer solution Take 500 mcg by nebulization 4 (four) times daily. For shortness of breath      . isosorbide mononitrate (IMDUR) 30 MG 24 hr tablet Take 1 tablet (30 mg total) by mouth at bedtime.  30 tablet  9  . levalbuterol (XOPENEX HFA) 45 MCG/ACT inhaler Inhale 2 puffs into the lungs every 4 (four) hours as needed for wheezing.  8.4 g  0  . levalbuterol (XOPENEX HFA) 45 MCG/ACT inhaler Inhale 1-2 puffs into the lungs every 4 (four) hours as needed for wheezing.  1 Inhaler  5  . nitroGLYCERIN (NITROSTAT) 0.4 MG SL tablet Place 0.4 mg under the tongue every 5 (five) minutes as needed. Chest pain      . potassium chloride SA (K-DUR,KLOR-CON) 20 MEQ tablet 1/2 tab by mouth twice daily      . predniSONE (STERAPRED UNI-PAK) 10 MG tablet Prednisone 10 mg take  4 each am x 2 days,   2 each am x 2 days,  1 each am x2days and stop  14 tablet  0  . promethazine (PHENERGAN) 25 MG tablet Take 25 mg by mouth every 4 (four) hours as needed. For nausea      . travoprost, benzalkonium, (TRAVATAN) 0.004 % ophthalmic solution Place 1 drop into both eyes at bedtime.        Current Facility-Administered Medications on File Prior to Visit  Medication Dose Route Frequency Provider Last Rate Last Dose  . mitomycin (MUTAMYCIN) chemo injection 40 mg  40 mg Bladder Instillation Once Antony Haste, MD         REVIEW OF SYSTEMS: No change from prior visit  PHYSICAL EXAMINATION:   Vital signs are BP 123/73  Pulse 95  Resp 18  Ht 5\' 5"  (1.651 m)  Wt 179 lb (81.194 kg)  BMI 29.79 kg/m2 General: The patient appears their stated age. HEENT: Slight improvement in facial droop Pulmonary: Nonlabored breathing    Diagnostic Studies I have reviewed her MRI of the brain. This was negative for subacute or acute process  Assessment: Asymptomatic right carotid stenosis Plan: The patient will continue with surveillance of her carotid occlusive disease. Her next I will be in 6 months.  Jorge Ny, M.D. [facility] [place] Office: [phone] Pager:  [phone]

## 2012-04-14 ENCOUNTER — Encounter: Payer: Self-pay | Admitting: Surgery

## 2012-04-23 ENCOUNTER — Other Ambulatory Visit: Payer: Self-pay | Admitting: *Deleted

## 2012-05-08 ENCOUNTER — Ambulatory Visit: Payer: Medicare Other | Admitting: Internal Medicine

## 2012-05-12 ENCOUNTER — Encounter: Payer: Self-pay | Admitting: Surgery

## 2012-05-13 ENCOUNTER — Encounter: Payer: Self-pay | Admitting: Cardiology

## 2012-05-13 ENCOUNTER — Ambulatory Visit (INDEPENDENT_AMBULATORY_CARE_PROVIDER_SITE_OTHER): Payer: Medicare Other | Admitting: Cardiology

## 2012-05-13 VITALS — BP 129/81 | HR 82 | Wt 179.0 lb

## 2012-05-13 DIAGNOSIS — I472 Ventricular tachycardia: Secondary | ICD-10-CM

## 2012-05-13 LAB — HEPATIC FUNCTION PANEL
ALT: 49 U/L — ABNORMAL HIGH (ref 0–35)
AST: 36 U/L (ref 0–37)
Alkaline Phosphatase: 65 U/L (ref 39–117)
Total Bilirubin: 0.7 mg/dL (ref 0.3–1.2)

## 2012-05-13 LAB — TSH: TSH: 0.97 u[IU]/mL (ref 0.35–5.50)

## 2012-05-13 NOTE — Assessment & Plan Note (Signed)
Continue aspirin and statin. 

## 2012-05-13 NOTE — Assessment & Plan Note (Signed)
Repeat abdominal ultrasound in December 2014.

## 2012-05-13 NOTE — Assessment & Plan Note (Signed)
Continue statin. 

## 2012-05-13 NOTE — Patient Instructions (Addendum)
Your physician wants you to follow-up in: 6 MONTHS WITH DR CRENSHAW You will receive a reminder letter in the mail two months in advance. If you don't receive a letter, please call our office to schedule the follow-up appointment.   A chest x-ray takes a picture of the organs and structures inside the chest, including the heart, lungs, and blood vessels. This test can show several things, including, whether the heart is enlarges; whether fluid is building up in the lungs; and whether pacemaker / defibrillator leads are still in place. AT [address] OFFICE  Your physician recommends that you HAVE LAB WORK TODAY 

## 2012-05-13 NOTE — Assessment & Plan Note (Signed)
Continue present blood pressure medications. 

## 2012-05-13 NOTE — Assessment & Plan Note (Signed)
Continue beta blocker and amiodarone.

## 2012-05-13 NOTE — Assessment & Plan Note (Signed)
Continue aspirin and statin. Followed by vascular surgery. 

## 2012-05-13 NOTE — Progress Notes (Signed)
HPI: Pleasant female with a history of CAD, hypertension, hyperlipidemia, SVT, AAA, COPD, bladder cancer and cerebrovascular disease. She presented to [facility] in early February 2012 for a COPD exacerbation and possible diastolic heart failure. She was noted to have wide complex tachycardia that was felt to be ventricular tachycardia and she was transferred to [facility]. Cardiac catheterization on 04/06/10 demonstrated a totally occluded LAD with right to left collaterals, a nodular density in the prox CFX that occupied about 70% of the prox vessel, and preserved LVF. It was felt that her CAD should be treated medically. She was evaluated by Dr. Ladona Ridgel. It was felt that her ventricular tachycardia would also be treated medically with adjustments in her beta blocker therapy. Ultrasound 12/13 revealed 3.8 by 3.8 cm AAA; fu one year. The patient has previous attempt at carotid stent on the left. The procedure was unsuccessful due to anatomy; followed by vascular surgery. Repeat echocardiogram in March of 2013 showed normal LV function. There was trace aortic insufficiency and mild left atrial enlargement. Patient also on amiodarone for h/o SVT. I last saw her in Dec of 2013. Since then, the patient does have dyspnea on exertion but there is no orthopnea, PND, pedal edema, palpitations, syncope or chest pain. Her dyspnea is worse compared to previous.   Current Outpatient Prescriptions  Medication Sig Dispense Refill  . acetaminophen-codeine (TYLENOL #3) 300-30 MG per tablet Take 1 tablet by mouth every 8 (eight) hours as needed.       Marland Kitchen amiodarone (PACERONE) 200 MG tablet Take 200 mg by mouth at bedtime.       Marland Kitchen aspirin EC 81 MG tablet Take 81 mg by mouth at bedtime.      . Aspirin-Acetaminophen-Caffeine (GOODYS EXTRA STRENGTH) [phone] MG PACK As directed      . budesonide-formoterol (SYMBICORT) 160-4.5 MCG/ACT inhaler Inhale 2 puffs into the lungs 2 (two) times daily.      .  clopidogrel (PLAVIX) 75 MG tablet Take 1 tablet (75 mg total) by mouth at bedtime.      Marland Kitchen diltiazem (CARDIZEM CD) 300 MG 24 hr capsule Take 300 mg by mouth daily. IN EVENING      . furosemide (LASIX) 20 MG tablet Take 20 mg by mouth every morning.      Marland Kitchen guaiFENesin (MUCINEX) 600 MG 12 hr tablet Take 600 mg by mouth every 12 (twelve) hours as needed.       Marland Kitchen ipratropium (ATROVENT) 0.02 % nebulizer solution Take 500 mcg by nebulization 4 (four) times daily. For shortness of breath      . isosorbide mononitrate (IMDUR) 30 MG 24 hr tablet Take 1 tablet (30 mg total) by mouth at bedtime.  30 tablet  9  . levalbuterol (XOPENEX HFA) 45 MCG/ACT inhaler Inhale 2 puffs into the lungs every 4 (four) hours as needed for wheezing.  8.4 g  0  . nitroGLYCERIN (NITROSTAT) 0.4 MG SL tablet Place 0.4 mg under the tongue every 5 (five) minutes as needed. Chest pain      . potassium chloride SA (K-DUR,KLOR-CON) 20 MEQ tablet 1/2 tab by mouth twice daily      . promethazine (PHENERGAN) 25 MG tablet Take 25 mg by mouth every 4 (four) hours as needed. For nausea      . Rosuvastatin Calcium (CRESTOR PO) Take 1 tablet by mouth daily.      . travoprost, benzalkonium, (TRAVATAN) 0.004 % ophthalmic solution Place 1 drop into both eyes at bedtime.  No current facility-administered medications for this visit.   Facility-Administered Medications Ordered in Other Visits  Medication Dose Route Frequency Provider Last Rate Last Dose  . mitomycin (MUTAMYCIN) chemo injection 40 mg  40 mg Bladder Instillation Once Antony Haste, MD         Past Medical History  Diagnosis Date  . Hypertension   . Hyperlipidemia   . Cerebrovascular disease, unspecified   . Unspecified glaucoma(365.9)   . Obesity, unspecified   . Coronary artery disease     cath 04/06/10: LAD occluded with R-L collats; med Rx WTC....probable V. Tach...med Rx  . Ventricular tachycardia   . SVT (supraventricular tachycardia)   . AAA (abdominal  aortic aneurysm)   . COPD (chronic obstructive pulmonary disease) 2013    [facility]  . Shortness of breath     WITH ACTIVITY--USES OXYGEN AS NEEDED  2&1/2 L PER NASAL CANNUL  . Headache     PAST HX MIGRAINES  . Arthritis   . Complication of anesthesia     PT STATES SHE CAN NOT BE PUT TO SLEEP BECAUSE OF HER LUNG PROBLEMS  . Carotid artery occlusion   . 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 Cystoscopy    . Bladder tumor excision      resection of 5 bladder, and fulguration of 1 small bladder tumor  . Cystoscopy      cold cup bladder biopsy of five tumors, and fulguration of one tumor  . Lung surgery      Remote right lung  . Tubal ligation      Bilateral  . Cystoscopy with biopsy  12/24/2011    Procedure: CYSTOSCOPY WITH BIOPSY;  Surgeon: Antony Haste, MD;  Location: [facility];  Service: Urology;  Laterality: N/A;    History   Social History  . Marital Status: Married    Spouse Name: N/A    Number of Children: 2  . Years of Education: N/A   Occupational History  . Retired     Social History Main Topics  . Smoking status: Former Smoker -- 0.30 packs/day for 58 years    Types: Cigarettes    Quit date: 05/18/2011  . Smokeless tobacco: Never Used  . Alcohol Use: No  . Drug Use: No  . Sexually Active: No   Other Topics Concern  . Not on file   Social History Narrative   Lives in [place], [place] with husband.     ROS: no fevers or chills, productive cough, hemoptysis, dysphasia, odynophagia, melena, hematochezia, dysuria, hematuria, rash, seizure activity, orthopnea, PND, pedal edema, claudication. Remaining systems are negative.  Physical Exam: Well-developed chronically ill appearing in no acute distress.  Skin is warm and dry.  HEENT is normal.  Neck is supple.  Chest diminished BS throughout Cardiovascular exam is regular rate and rhythm.  Abdominal exam nontender or distended. No masses  palpated. Extremities show no edema. neuro grossly intact  ECG sinus rhythm, right bundle branch block. Prolonged QT interval.

## 2012-05-13 NOTE — Assessment & Plan Note (Signed)
Most likely related to COPD. I cannot exclude a contribution from coronary disease.  We discussed a functional study but she declined preferring only medical therapy at this point.

## 2012-05-13 NOTE — Assessment & Plan Note (Signed)
Continue amiodarone. Check liver functions, TSH and repeat chest x-ray.

## 2012-05-19 ENCOUNTER — Telehealth: Payer: Self-pay | Admitting: Cardiology

## 2012-05-19 NOTE — Telephone Encounter (Signed)
Patient's 05/13/12 hepatic panel and tsh faxed to Dr.Nyland fax # [phone].

## 2012-05-19 NOTE — Telephone Encounter (Signed)
New problem    PCP is waiting on lab results.    Fax # [phone]. Dr. Lysbeth Galas.

## 2012-05-26 ENCOUNTER — Ambulatory Visit (INDEPENDENT_AMBULATORY_CARE_PROVIDER_SITE_OTHER): Payer: Medicare Other | Admitting: Internal Medicine

## 2012-05-26 ENCOUNTER — Other Ambulatory Visit (INDEPENDENT_AMBULATORY_CARE_PROVIDER_SITE_OTHER): Payer: Medicare Other

## 2012-05-26 ENCOUNTER — Encounter: Payer: Self-pay | Admitting: Internal Medicine

## 2012-05-26 ENCOUNTER — Ambulatory Visit (INDEPENDENT_AMBULATORY_CARE_PROVIDER_SITE_OTHER)
Admission: RE | Admit: 2012-05-26 | Discharge: 2012-05-26 | Disposition: A | Discharge status: Home / Self Care | Payer: Medicare Other | Source: Ambulatory Visit | Attending: Internal Medicine | Admitting: Internal Medicine

## 2012-05-26 VITALS — BP 146/90 | HR 95 | Temp 97.1°F | Ht 66.0 in | Wt 176.0 lb

## 2012-05-26 DIAGNOSIS — M549 Dorsalgia, unspecified: Secondary | ICD-10-CM | POA: Insufficient documentation

## 2012-05-26 DIAGNOSIS — J9610 Chronic respiratory failure, unspecified whether with hypoxia or hypercapnia: Secondary | ICD-10-CM

## 2012-05-26 DIAGNOSIS — R8290 Unspecified abnormal findings in urine: Secondary | ICD-10-CM | POA: Insufficient documentation

## 2012-05-26 DIAGNOSIS — J449 Chronic obstructive pulmonary disease, unspecified: Secondary | ICD-10-CM

## 2012-05-26 DIAGNOSIS — R82998 Other abnormal findings in urine: Secondary | ICD-10-CM

## 2012-05-26 LAB — URINALYSIS
Bilirubin Urine: NEGATIVE
Hgb urine dipstick: NEGATIVE
Ketones, ur: NEGATIVE
Nitrite: NEGATIVE
Total Protein, Urine: NEGATIVE
Urine Glucose: NEGATIVE
pH: 6 (ref 5.0–8.0)

## 2012-05-26 NOTE — Patient Instructions (Addendum)
Target for your 02 saturation target  is above 90-94   Please remember to go to the lab and x-ray department downstairs for your tests - we will call you with the results when they are available.  See calendar for specific medication instructions and bring it back for each and every office visit for every healthcare provider you see.  Without it,  you may not receive the best quality medical care that we feel you deserve.  You will note that the calendar groups together  your maintenance  medications that are timed at particular times of the day.  Think of this as your checklist for what your doctor has instructed you to do until your next evaluation to see what benefit  there is  to staying on a consistent group of medications intended to keep you well.  The other group at the bottom is entirely up to you to use as you see fit  for specific symptoms that may arise between visits that require you to treat them on an as needed basis.  Think of this as your action plan or "what if" list.   Separating the top medications from the bottom group is fundamental to providing you adequate care going forward.    Please schedule a follow up visit in 3 months but call sooner if needed  Late add : will rec rehab referral

## 2012-05-26 NOTE — Progress Notes (Signed)
Subjective:    Patient ID: [name] [name], female    DOB: 1940/10/28    MRN: [id number]  Brief patient profile:  71 yowf quit smoking  04/2011 dx'd with stage III COPD in 2004.  HPI 06/24/2011 Post Hospital follow up  Last seen in office 01/2010.    COPD-oxygen dependant 2.5 liters, admitted 05/15/11 for acute resp distress. She had a prolonged hospitalization/critical illness for acute on chronic resp failure secondary to COPD exacerbation w/ initial failure of BIPAP requiring intubation , extubated on 3/22. She required aggressive pulmonary hygiene, IV ABX and Steroids. Stay was complicated by PVST requiring med management. She had a  slow recovery with discharge to rehab on 06/04/11 with deconditioning and anxiety. Required high flow O2 with 5 l rest and 6 l with activity.   Since discharge feeling better , breathing back to her baseline.  She is now at home , discharged from IP rehab on 06/19/11.  Currently on Symbicort Twice daily.  Taking Atrovent NEB Twice daily   Taking xopenex neb Twice daily   No smoking since discharge.  On steroid taper, has few days left.  Now on 3 l/m continuously .  Has home health nursing at home  rec Continue on Symbicort 2 puffs twice daily, brush rinse and gargle after use. Increase ipratropium nebulizer 4 times daily. Change Xopenex nebulizer to as needed only for shortness of breath and wheezing. We are very proud of you for quitting smoking. Keep up the good work.    07/18/2011 f/u ov/[name] [name] cc w/c bound due to weakness and 02 3lpm 24 hours per day. No cough, confused with meds/ names (only used saba so far on day of ov, not yet gotten around to symbicort) but seems to be getting along ok at this point.   rec Symbicort Take 2 puffs first thing in am and then another 2 puffs about 12 hours later.  Work on inhaler technique:   .    Only use your albuterol as a rescue medication  08/26/2011 Follow up and med calendar  We reviewed all her meds and organized them  into a med calendar w/ pt education  She feels she is at her baseline. W/ no flare of cough or dyspnea.  rec No change rx, follow med calendar  10/25/2011 f/u ov/[name] [name] cc sob across parking lot to building no longer using 02 consistently doesn't really think it improves her activity tolerance. rec symbicort  Take 2 puffs first thing in am and then another 2 puffs about 12 hours later.  Ok to leave off the 02 at rest but you should always wear it at bedtime and as needed during the day with activity Work on inhaler technique   01/31/2012 f/u ov/[name] [name] cc worse x sev weeks breathing worse with any acitivity x @ rest, more need for 02,  Has calendar but not clear on how to use it, using ipatropium prn, poor hfa and symbicort inhaler empty.  No obvious daytime variabilty or assoc excess cough/ mucus production or cp or chest tightness, subjective wheeze overt sinus or hb symptoms. No unusual exp hx   >>pred taper   02/10/2012 Follow up and med calendar  Returns for follow up and med review.  We reveiwed all her meds and organized them into med calendar w/ pt educatiion  Feels better from last ov , tx w/ steroid taper for COPD flare      No fever or chest pain .  rec Use 02 4lpm walking  Follow med calendar   05/26/2012 f/u ov/[name] [name] cc Chief Complaint  Patient presents with  . COPD    Breathing is unchanged.     doe x room to room even on 4lpm and self titrating 02 at other times.  No obvious daytime variabilty or assoc chronic cough or cp or chest tightness, subjective wheeze overt sinus or hb symptoms. No unusual exp hx or h/o childhood pna/ asthma or premature birth to her knowledge.   Sleeping ok without nocturnal  or early am exacerbation  of respiratory  c/o's or need for noct saba. Also denies any obvious fluctuation of symptoms with weather or environmental changes or other aggravating or alleviating factors except as outlined above   ROS  The following are not active complaints unless  bolded sore throat, dysphagia, dental problems, itching, sneezing,  nasal congestion or excess/ purulent secretions, ear ache,   fever, chills, sweats, unintended wt loss, pleuritic or exertional cp, hemoptysis,  orthopnea pnd or leg swelling, presyncope, palpitations, heartburn, abdominal pain, anorexia, nausea, vomiting, diarrhea  or change in bowel or urinary habits, change in stools or urine, dysuria,hematuria,  rash, arthralgias= c/w mechanical pain worse with wt bearing R Post pelvis, visual complaints, headache, numbness weakness or ataxia or problems with walking or coordination,  change in mood/affect or memory.           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)  HYPERLIPIDEMIA (ICD-272.4)  HYPERTENSION (ICD-401.9)  CEREBROVASCULAR DISEASE (ICD-437.9)  - 80% R Carotid stenosis - see note by Bradham11/16/11  GLAUCOMA (ICD-365.9)  OBESITY (ICD-278.00)  COPD (ICD-496)  - PFT's 12/10/2002 FEV1 .85(37%) ratio 47 and 11 % better p B2, DLC0 42%  - Walking sats January 25, 2010 > desat even on 2lpm > rec 2.5 lpm 24 h per day                        Objective:   Physical Exam     Wt Readings from Last 3 Encounters:  05/26/12 176 lb (79.833 kg)  05/13/12 179 lb (81.194 kg)  04/13/12 179 lb (81.194 kg)    GEN: elderly w/c bound, chronically ill appearing / sitting in w/c on arrival     HEENT:  [place]/AT,  EACs-clear, TMs-wnl, NOSE-clear, THROAT-clear, no lesions, no postnasal drip or exudate noted.   NECK:  Supple w/ fair ROM; no JVD; normal carotid impulses w/o bruits; no thyromegaly or nodules palpated; no lymphadenopathy.  RESP  Distant  BS  w/o, wheezes/ rales/ or rhonchi.no accessory muscle use, no dullness to percussion  CARD:  RRR, no m/r/g  , no peripheral edema, pulses intact, no cyanosis or clubbing.  GI:   Soft & nt; nml bowel sounds; no organomegaly or masses detected.  Musco: Warm bil, no deformities or joint swelling noted.      CXR  05/26/2012 :    Chronic changes of the lungs and COPD stable compared to prior exam.             Assessment & Plan:

## 2012-05-26 NOTE — Assessment & Plan Note (Addendum)
-   PFT's 12/10/2002 FEV1 .85(37%) ratio 47 and 11 % better p B2, DLC0 42%  - HFA 50% p coaching 01/31/2012  - med calendar 02/10/12   Adequate control on present rx, reviewed   Each maintenance medication was reviewed in detail including most importantly the difference between maintenance and as needed and under what circumstances the prns are to be used. This was done in the context of a medication calendar review which provided the patient with a user-friendly unambiguous mechanism for medication administration and reconciliation and provides an action plan for all active problems. It is critical that this be shown to every doctor  for modification during the office visit if necessary so the patient can use it as a working document.

## 2012-05-27 NOTE — Assessment & Plan Note (Signed)
U/a neg, c/w mechanical pain worse with wt bearing R Post pelvis

## 2012-05-27 NOTE — Progress Notes (Signed)
Quick Note:  Spoke with pt and notified of results per Dr. Wert. Pt verbalized understanding and denied any questions.  ______ 

## 2012-05-28 ENCOUNTER — Telehealth: Payer: Self-pay | Admitting: *Deleted

## 2012-05-28 NOTE — Telephone Encounter (Signed)
Message copied by Christen Butter on Thu May 28, 2012  9:05 AM ------      Message from: Sandrea Hughs B      Created: Wed May 27, 2012  7:21 AM       See if she'll agree to rehab referral if not already done ------

## 2012-05-28 NOTE — Telephone Encounter (Signed)
Spoke with pt and offered to refer to pulm rehab She states she will think about this and call for referral if she wants

## 2012-05-28 NOTE — Telephone Encounter (Signed)
Pt returned call. [name] [name]  

## 2012-05-28 NOTE — Telephone Encounter (Signed)
LMTCB

## 2012-06-18 ENCOUNTER — Other Ambulatory Visit: Payer: Self-pay | Admitting: Internal Medicine

## 2012-06-23 ENCOUNTER — Other Ambulatory Visit: Payer: Self-pay | Admitting: Internal Medicine

## 2012-07-31 ENCOUNTER — Other Ambulatory Visit: Payer: Self-pay | Admitting: Cardiology

## 2012-08-24 ENCOUNTER — Encounter: Payer: Self-pay | Admitting: Internal Medicine

## 2012-08-24 ENCOUNTER — Ambulatory Visit (INDEPENDENT_AMBULATORY_CARE_PROVIDER_SITE_OTHER): Payer: Medicare Other | Admitting: Internal Medicine

## 2012-08-24 VITALS — BP 126/72 | HR 77 | Temp 98.3°F | Ht 65.0 in

## 2012-08-24 DIAGNOSIS — J9610 Chronic respiratory failure, unspecified whether with hypoxia or hypercapnia: Secondary | ICD-10-CM

## 2012-08-24 DIAGNOSIS — J449 Chronic obstructive pulmonary disease, unspecified: Secondary | ICD-10-CM

## 2012-08-24 MED ORDER — PANTOPRAZOLE SODIUM 40 MG PO TBEC: 40.0000 mg | DELAYED_RELEASE_TABLET | Freq: Every day | ORAL | Status: DC

## 2012-08-24 MED ORDER — FAMOTIDINE 20 MG PO TABS: ORAL_TABLET | ORAL | Status: DC

## 2012-08-24 NOTE — Patient Instructions (Addendum)
Work on inhaler technique:  relax and gently blow all the way out then take a nice smooth deep breath back in, triggering the inhaler at same time you start breathing in.  Hold for up to 5 seconds if you can.  Rinse and gargle with water when done    Pantoprazole (protonix) 40 mg   Take 30-60 min before first meal of the day and Pepcid 20 mg one bedtime until return to office - this is the best way to tell whether stomach acid is contributing to your problem.    See Tammy NP w/in 2 weeks with all your medications, even over the counter meds, separated in two separate bags, the ones you take no matter what vs the ones you stop once you feel better and take only as needed when you feel you need them.   Tammy  will generate for you a new user friendly medication calendar that will put [place] all on the same page re: your medication use.     Without this process, it simply isn't possible to assure that we are providing  your outpatient care  with  the attention to detail we feel you deserve.   If we cannot assure that you're getting that kind of care,  then we cannot manage your problem effectively from this clinic.  Once you have seen Tammy and we are sure that we're all on the same page with your medication use she will arrange follow up with me with pfts on return consider change to perforomist and budesonide if nbt doing better with hfa and still having so much sob

## 2012-08-24 NOTE — Assessment & Plan Note (Addendum)
-  Walking sats January 25, 2010 > desat even on 2lpm   -10/25/2011  Sat 90% at rest >  Walked 2lpm x one lap @ 185 stopped due to  Sob/ fatigue but no desat so changed to ok to leave off at rest -05/26/2012   Walked 4lp  x one half lap = 90 ft,  stopped due to  Fatigue, no desat  Adequate control on present rx, reviewed poor correlation between 02 sats and sob, no change in rx needed   rx 02 3lpm at hs and activity, ok to leave off if rest if sats ok RA by self titration, increase to 4lpm with exertion

## 2012-08-24 NOTE — Assessment & Plan Note (Addendum)
-   PFT's 12/10/2002 FEV1 .85(37%) ratio 47 and 11 % better p B2, DLC0 42%  - HFA 50% p coaching 08/24/2012  - med calendar 02/10/12  DDX of  difficult airways managment all start with A and  include Adherence, Ace Inhibitors, Acid Reflux, Active Sinus Disease, Alpha 1 Antitripsin deficiency, Anxiety masquerading as Airways dz,  ABPA,  allergy(esp in young), Aspiration (esp in elderly), Adverse effects of DPI,  Active smokers, plus two Bs  = Bronchiectasis and Beta blocker use..and one C= CHF   Adherence is always the initial "prime suspect" and is a multilayered concern that requires a "trust but verify" approach in every patient - starting with knowing how to use medications, especially inhalers, correctly, keeping up with refills and understanding the fundamental difference between maintenance and prns vs those medications only taken for a very short course and then stopped and not refilled.   Each maintenance medication was reviewed in detail including most importantly the difference between maintenance and as needed and under what circumstances the prns are to be used. This was done in the context of a medication calendar review which provided the patient with a user-friendly unambiguous mechanism for medication administration and reconciliation and provides an action plan for all active problems. It is critical that this be shown to every doctor  for modification during the office visit if necessary so the patient can use it as a working document.      The proper method of use, as well as anticipated side effects, of a metered-dose inhaler are discussed and demonstrated to the patient. Improved effectiveness after extensive coaching during this visit to a level of approximately  50% but no better> consider change to perforomist and budesonide  ? Acid reflux suggested by voice fatigue > trial of ppi and h2 hs  ? Anxiety > dx of exclusion  ? Active smoking > denies x one year

## 2012-08-24 NOTE — Progress Notes (Signed)
Subjective:    Patient ID: [name] [name], female    DOB: 30-Jul-1940    MRN: [id number]  Brief patient profile:  72 yowf quit smoking  04/2011 dx'd with stage III COPD in 2004.  HPI 07/18/2011 f/u ov/Thomos Domine cc w/c bound due to weakness and 02 3lpm 24 hours per day. No cough, confused with meds/ names (only used saba so far on day of ov, not yet gotten around to symbicort) but seems to be getting along ok at this point.   rec Symbicort Take 2 puffs first thing in am and then another 2 puffs about 12 hours later.  Work on inhaler technique:   .    Only use your albuterol as a rescue medication   05/26/2012 f/u ov/Amaro Mangold cc Chief Complaint  Patient presents with  . COPD    Breathing is unchanged.   doe x room to room even on 4lpm and self titrating 02 at other times rec No change rx Use med calendar      08/24/2012 f/u ov/Treyvon Blahut re copd/ using med calendar better in terms of maint but not prns Chief Complaint  Patient presents with  . 3 month follow up    increased SOB all the time - worsened over the past 2 wks.  Also has wheezing, chest tightness/chest pain, and prod cough with white mucus.    no def change in pain or cough, just the breathing, was fine on 6/27, much worse since assoc with hoarseness and ? Overt HB   No obvious pattern to day to day  variabilty or assoc chronic cough or cp or chest tightness, subjective wheeze overt sinus  symptoms. No unusual exp hx or h/o childhood pna/ asthma or premature birth to her knowledge.   Sleeping ok without nocturnal  or early am exacerbation  of respiratory  c/o's or need for noct saba. Also denies any obvious fluctuation of symptoms with weather or environmental changes or other aggravating or alleviating factors except as outlined above   ROS  The following are not active complaints unless bolded sore throat, dysphagia, dental problems, itching, sneezing,  nasal congestion or excess/ purulent secretions, ear ache,   fever, chills, sweats,  unintended wt loss, pleuritic or exertional cp, hemoptysis,  orthopnea pnd or leg swelling, presyncope, palpitations, heartburn, abdominal pain, anorexia, nausea, vomiting, diarrhea  or change in bowel or urinary habits, change in stools or urine, dysuria,hematuria,  rash, arthralgias= c/w mechanical pain worse with wt bearing R Post pelvis does not correlate with sob, visual complaints, headache, numbness weakness or ataxia or problems with walking or coordination,  change in mood/affect or memory.           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)  HYPERLIPIDEMIA (ICD-272.4)  HYPERTENSION (ICD-401.9)  CEREBROVASCULAR DISEASE (ICD-437.9)  - 80% R Carotid stenosis - see note by Bradham11/16/11  GLAUCOMA (ICD-365.9)  OBESITY (ICD-278.00)  COPD (ICD-496)  - PFT's 12/10/2002 FEV1 .85(37%) ratio 47 and 11 % better p B2, DLC0 42%  - Walking sats January 25, 2010 > desat even on 2lpm > rec 2.5 lpm 24 h per day                        Objective:   Physical Exam    08/24/2012       Wt Readings from Last 3 Encounters:  05/26/12 176 lb (79.833 kg)  05/13/12 179 lb (81.194 kg)  04/13/12 179 lb (81.194 kg)    GEN:  elderly w/c bound, chronically ill appearing / classic voice fatigue/ hopeless affect    HEENT:  [place]/AT,  EACs-clear, TMs-wnl, NOSE-clear, THROAT-clear, no lesions, no postnasal drip or exudate noted.   NECK:  Supple w/ fair ROM; no JVD; normal carotid impulses w/o bruits; no thyromegaly or nodules palpated; no lymphadenopathy.  RESP  Distant  BS  w/o, wheezes/ rales/ or rhonchi.no accessory muscle use, no dullness to percussion  CARD:  RRR, no m/r/g  , no peripheral edema, pulses intact, no cyanosis or clubbing.  GI:   Soft & nt; nml bowel sounds; no organomegaly or masses detected.  Musco: Warm bil, no deformities or joint swelling noted.     CXR  05/26/2012 : Chronic changes of the lungs and COPD stable compared to  prior exam.             Assessment & Plan:

## 2012-08-26 ENCOUNTER — Telehealth: Payer: Self-pay | Admitting: Internal Medicine

## 2012-08-26 MED ORDER — RANITIDINE HCL 150 MG PO TABS: 150.0000 mg | ORAL_TABLET | Freq: Every day | ORAL | Status: DC

## 2012-08-26 NOTE — Telephone Encounter (Signed)
stongly doubt this is the case but if won't take pepcid then rec change to zantac 150 mg

## 2012-08-26 NOTE — Telephone Encounter (Signed)
Called, spoke with pt. Explained below per MW to her. Pt still feels the pepcid caused these symptoms and doesn't want to take it anymore. Advised to change to zantac 150; rx sent to [facility]  Pt verbalized understanding of instructions and voiced no further questions or concerns at this time.  ** med list updated.

## 2012-08-26 NOTE — Telephone Encounter (Signed)
I spoke with pt. She stated pepcid caused her heart to race and felt very nauseated. She stated she is not going to take this anymore. Please advise if you have any further recs Dr. Sherene Sires thanks  Allergies  Allergen Reactions  . Albuterol     Jittery/shakiness  . Sulfa Antibiotics Nausea Only

## 2012-08-27 ENCOUNTER — Telehealth: Payer: Self-pay | Admitting: Internal Medicine

## 2012-08-27 NOTE — Telephone Encounter (Signed)
Yes the protonix is Take 30-60 min before first meal of the day

## 2012-08-27 NOTE — Telephone Encounter (Signed)
Spoke with patient, patient given zantac yesterday: Nyoka Cowden, MD at 08/26/2012 12:25 PM   Status: Signed            stongly doubt this is the case but if won't take pepcid then rec change to zantac 150 mg    Patient would like to know if she should continue on protonix as well? Dr. Sherene Sires please advise, thank you

## 2012-08-27 NOTE — Telephone Encounter (Signed)
Pt is aware to continue to take Protonix.

## 2012-09-07 ENCOUNTER — Encounter: Payer: Self-pay | Admitting: Adult Health

## 2012-09-07 ENCOUNTER — Ambulatory Visit (INDEPENDENT_AMBULATORY_CARE_PROVIDER_SITE_OTHER): Payer: Medicare Other | Admitting: Adult Health

## 2012-09-07 VITALS — BP 124/74 | HR 86 | Temp 98.4°F

## 2012-09-07 DIAGNOSIS — J449 Chronic obstructive pulmonary disease, unspecified: Secondary | ICD-10-CM

## 2012-09-07 MED ORDER — ARFORMOTEROL TARTRATE 15 MCG/2ML IN NEBU: 15.0000 ug | INHALATION_SOLUTION | Freq: Two times a day (BID) | RESPIRATORY_TRACT | Status: DC

## 2012-09-07 MED ORDER — BUDESONIDE 0.25 MG/2ML IN SUSP: 0.2500 mg | Freq: Two times a day (BID) | RESPIRATORY_TRACT | Status: DC

## 2012-09-07 NOTE — Patient Instructions (Addendum)
Follow med calendar closely and bring to each visit.  Switch Symbicort to Budesonide/Brovana Neb Twice daily   follow up Dr. Sherene Sires  In 3 months with PFT

## 2012-09-07 NOTE — Assessment & Plan Note (Addendum)
Continues to have difficulty with coordination of Inhaler/MDI .  Patient's medications were reviewed today and patient education was given. Computerized medication calendar was adjusted/completed   Plan  Follow med calendar closely and bring to each visit.  Switch Symbicort to Budesonide/Brovana Neb Twice daily   follow up Dr. Sherene Sires  In 3 months with PFT

## 2012-09-07 NOTE — Progress Notes (Signed)
Subjective:    Patient ID: [name] [name], female    DOB: 03-Nov-1940    MRN: [id number]  Brief patient profile:  72 yowf quit smoking  04/2011 dx'd with stage III COPD in 2004.  HPI 07/18/2011 f/u ov/Wert cc w/c bound due to weakness and 02 3lpm 24 hours per day. No cough, confused with meds/ names (only used saba so far on day of ov, not yet gotten around to symbicort) but seems to be getting along ok at this point.   rec Symbicort Take 2 puffs first thing in am and then another 2 puffs about 12 hours later.  Work on inhaler technique:   .    Only use your albuterol as a rescue medication   05/26/2012 f/u ov/Wert cc Chief Complaint  Patient presents with  . COPD    Breathing is unchanged.   doe x room to room even on 4lpm and self titrating 02 at other times rec No change rx Use med calendar      08/24/2012 f/u ov/Wert re copd/ using med calendar better in terms of maint but not prns Chief Complaint  Patient presents with  . 3 month follow up    increased SOB all the time - worsened over the past 2 wks.  Also has wheezing, chest tightness/chest pain, and prod cough with white mucus.    no def change in pain or cough, just the breathing, was fine on 6/27, much worse since assoc with hoarseness and ? Overt HB  >PPI and pepcid   09/07/2012 Follow up and Med review  Returns for follow up and med review .  - pt brought all meds with her today.  We reviewed all her medications and organized them into a medication calendar with patient education. Patient continues to complain of difficulty using her Symbicort inhaler. We discussed switching Symbicort over to, nebulized medications. Patient also had difficulty with Pepcid and has responded to switch over to Zantac with improvement. Patient denies any hemoptysis, orthopnea, PND, or leg swelling.  ROS  The following are not active complaints unless bolded sore throat, dysphagia, dental problems, itching, sneezing,  nasal congestion or  excess/ purulent secretions, ear ache,   fever, chills, sweats, unintended wt loss, pleuritic or exertional cp, hemoptysis,  orthopnea pnd or leg swelling, presyncope, palpitations, heartburn, abdominal pain, anorexia, nausea, vomiting, diarrhea  or change in bowel or urinary habits, change in stools or urine, dysuria,hematuria,  rash, arthralgias= c/w mechanical pain worse with wt bearing R Post pelvis does not correlate with sob, visual complaints, headache, numbness weakness or ataxia or problems with walking or coordination,  change in mood/affect or memory.           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)  HYPERLIPIDEMIA (ICD-272.4)  HYPERTENSION (ICD-401.9)  CEREBROVASCULAR DISEASE (ICD-437.9)  - 80% R Carotid stenosis - see note by Bradham11/16/11  GLAUCOMA (ICD-365.9)  OBESITY (ICD-278.00)  COPD (ICD-496)  - PFT's 12/10/2002 FEV1 .85(37%) ratio 47 and 11 % better p B2, DLC0 42%  - Walking sats January 25, 2010 > desat even on 2lpm > rec 2.5 lpm 24 h per day                        Objective:   Physical Exam       GEN: elderly w/c bound, chronically ill appearing    HEENT:  [place]/AT,  EACs-clear, TMs-wnl, NOSE-clear, THROAT-clear, no lesions, no postnasal drip or exudate noted.  NECK:  Supple w/ fair ROM; no JVD; normal carotid impulses w/o bruits; no thyromegaly or nodules palpated; no lymphadenopathy.  RESP  Distant  BS  w/o, wheezes/ rales/ or rhonchi.no accessory muscle use, no dullness to percussion  CARD:  RRR, no m/r/g  , no peripheral edema, pulses intact, no cyanosis or clubbing.  GI:   Soft & nt; nml bowel sounds; no organomegaly or masses detected.  Musco: Warm bil, no deformities or joint swelling noted.     CXR  05/26/2012 : Chronic changes of the lungs and COPD stable compared to prior exam.             Assessment & Plan:

## 2012-09-10 ENCOUNTER — Other Ambulatory Visit: Payer: Self-pay | Admitting: *Deleted

## 2012-09-10 MED ORDER — DILTIAZEM HCL ER COATED BEADS 300 MG PO CP24: 300.0000 mg | ORAL_CAPSULE | Freq: Every day | ORAL | Status: DC

## 2012-10-08 ENCOUNTER — Telehealth: Payer: Self-pay | Admitting: Internal Medicine

## 2012-10-08 DIAGNOSIS — J449 Chronic obstructive pulmonary disease, unspecified: Secondary | ICD-10-CM

## 2012-10-08 NOTE — Telephone Encounter (Addendum)
Pt requesting a new portable O2. Pt requesting the new Inogen One Portable O2.  Will send to MW for recs.

## 2012-10-08 NOTE — Telephone Encounter (Signed)
Fine with me

## 2012-10-08 NOTE — Telephone Encounter (Signed)
Order has been placed. Pt is aware. 

## 2012-10-09 ENCOUNTER — Encounter: Payer: Self-pay | Admitting: Surgery

## 2012-10-12 ENCOUNTER — Other Ambulatory Visit (INDEPENDENT_AMBULATORY_CARE_PROVIDER_SITE_OTHER): Payer: Medicare Other | Admitting: *Deleted

## 2012-10-12 ENCOUNTER — Ambulatory Visit (INDEPENDENT_AMBULATORY_CARE_PROVIDER_SITE_OTHER): Payer: Medicare Other | Admitting: Surgery

## 2012-10-12 ENCOUNTER — Encounter: Payer: Self-pay | Admitting: Surgery

## 2012-10-12 DIAGNOSIS — I6529 Occlusion and stenosis of unspecified carotid artery: Secondary | ICD-10-CM

## 2012-10-12 DIAGNOSIS — I63239 Cerebral infarction due to unspecified occlusion or stenosis of unspecified carotid arteries: Secondary | ICD-10-CM

## 2012-10-12 NOTE — Progress Notes (Signed)
[facility]   Patient name: [name] [name] MRN: [id number] DOB: 1940/12/06 Sex: female     Chief Complaint  Patient presents with  . Re-evaluation    6 month f/u carotid duplex     HISTORY OF PRESENT ILLNESS: The patient is back today for followup of her left carotid stenosis. The last time I saw her she was recovering from Bell's palsy. She did have an MRI scan which was negative for infarct when she was diagnosed with Bell's palsy. Her symptoms are intermittent but getting better. She reports no new neurologic symptoms. I have been following her for progressive greater than 80% left carotid stenosis. She has been deemed a poor candidate for surgery from both cardiac and a pulmonary perspective.  Past Medical History  Diagnosis Date  . Hypertension   . Hyperlipidemia   . Cerebrovascular disease, unspecified   . Unspecified glaucoma(365.9)   . Obesity, unspecified   . Coronary artery disease     cath 04/06/10: LAD occluded with R-L collats; med Rx WTC....probable V. Tach...med Rx  . Ventricular tachycardia   . SVT (supraventricular tachycardia)   . AAA (abdominal aortic aneurysm)   . COPD (chronic obstructive pulmonary disease) 2013    [facility]  . Shortness of breath     WITH ACTIVITY--USES OXYGEN AS NEEDED  2&1/2 L PER NASAL CANNUL  . Headache(784.0)     PAST HX MIGRAINES  . Arthritis   . Complication of anesthesia     PT STATES SHE CAN NOT BE PUT TO SLEEP BECAUSE OF HER LUNG PROBLEMS  . Carotid artery occlusion   . 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 Cystoscopy    . Bladder tumor excision      resection of 5 bladder, and fulguration of 1 small bladder tumor  . Cystoscopy      cold cup bladder biopsy of five tumors, and fulguration of one tumor  . Lung surgery      Remote right lung  . Tubal ligation      Bilateral  . Cystoscopy with biopsy  12/24/2011    Procedure: CYSTOSCOPY WITH  BIOPSY;  Surgeon: Antony Haste, MD;  Location: [facility];  Service: Urology;  Laterality: N/A;    History   Social History  . Marital Status: Married    Spouse Name: N/A    Number of Children: 2  . Years of Education: N/A   Occupational History  . Retired     Social History Main Topics  . Smoking status: Former Smoker -- 0.30 packs/day for 58 years    Types: Cigarettes    Quit date: 05/18/2011  . Smokeless tobacco: Never Used  . Alcohol Use: No  . Drug Use: No  . Sexual Activity: No   Other Topics Concern  . Not on file   Social History Narrative   Lives in [place], [place] with husband.     Family History  Problem Relation Age of Onset  . Heart disease Mother 91  . Heart disease Father   . Hyperlipidemia Father   . Hypertension Father   . Diabetes Daughter     Allergies as of 10/12/2012 - Review Complete 10/12/2012  Allergen Reaction Noted  . Albuterol  06/24/2011  . Sulfa antibiotics Nausea Only 04/06/2012    Current Outpatient Prescriptions on File Prior to Visit  Medication Sig Dispense Refill  . acetaminophen-codeine (TYLENOL #3) 300-30 MG  per tablet Take 1 tablet by mouth every 8 (eight) hours as needed.       Marland Kitchen arformoterol (BROVANA) 15 MCG/2ML NEBU Take 2 mLs (15 mcg total) by nebulization 2 (two) times daily. Dx: 496  120 mL  11  . aspirin EC 81 MG tablet Take 81 mg by mouth at bedtime.      . Aspirin-Acetaminophen-Caffeine (GOODYS EXTRA STRENGTH) [id number] MG PACK As directed      . budesonide (PULMICORT) 0.25 MG/2ML nebulizer solution Take 2 mLs (0.25 mg total) by nebulization 2 (two) times daily. Dx: 496  120 mL  11  . budesonide-formoterol (SYMBICORT) 160-4.5 MCG/ACT inhaler Inhale 2 puffs into the lungs 2 (two) times daily.      . clopidogrel (PLAVIX) 75 MG tablet Take 1 tablet (75 mg total) by mouth at bedtime.      Marland Kitchen diltiazem (CARDIZEM CD) 300 MG 24 hr capsule Take 1 capsule (300 mg total) by mouth daily. IN EVENING  30 capsule  6  .  furosemide (LASIX) 20 MG tablet TAKE (1) TABLET BY MOUTH ONCE DAILY AS NEEDED.  30 tablet  3  . ipratropium (ATROVENT) 0.02 % nebulizer solution USE 1 VIAL IN NEBULIZER EVERY 6 HOURS.  300 mL  2  . isosorbide mononitrate (IMDUR) 30 MG 24 hr tablet Take 1 tablet (30 mg total) by mouth at bedtime.  30 tablet  9  . levalbuterol (XOPENEX HFA) 45 MCG/ACT inhaler Inhale 2 puffs into the lungs every 4 (four) hours as needed for wheezing.  8.4 g  0  . nitroGLYCERIN (NITROSTAT) 0.4 MG SL tablet Place 0.4 mg under the tongue every 5 (five) minutes as needed. Chest pain      . pantoprazole (PROTONIX) 40 MG tablet Take 1 tablet (40 mg total) by mouth daily. Take 30-60 min before first meal of the day  30 tablet  2  . potassium chloride SA (K-DUR,KLOR-CON) 20 MEQ tablet Take 20 mEq by mouth 2 (two) times daily.       . promethazine (PHENERGAN) 25 MG tablet Take 25 mg by mouth every 4 (four) hours as needed. For nausea      . ranitidine (ZANTAC) 150 MG tablet Take 1 tablet (150 mg total) by mouth at bedtime.  30 tablet  2  . Rosuvastatin Calcium (CRESTOR PO) Take 20 mg by mouth daily.       . travoprost, benzalkonium, (TRAVATAN) 0.004 % ophthalmic solution Place 1 drop into both eyes at bedtime.       Marland Kitchen amiodarone (PACERONE) 200 MG tablet Take 200 mg by mouth at bedtime.        Current Facility-Administered Medications on File Prior to Visit  Medication Dose Route Frequency Provider Last Rate Last Dose  . mitomycin (MUTAMYCIN) chemo injection 40 mg  40 mg Bladder Instillation Once Antony Haste, MD         REVIEW OF SYSTEMS: No changes from prior visit  PHYSICAL EXAMINATION:   Vital signs are BP 151/85  Pulse 77  Ht 5\' 5"  (1.651 m)  Wt 175 lb (79.379 kg)  BMI 29.12 kg/m2  SpO2 98% General: The patient appears their stated age. HEENT:  No gross abnormalities Pulmonary:  Non labored breathing Musculoskeletal: There are no major deformities. Neurologic: No focal weakness or paresthesias are  detected, Skin: There are no ulcer or rashes noted. Psychiatric: The patient has normal affect. Cardiovascular: There is a regular rate and rhythm without significant murmur appreciated. 1+ radial pulse bilaterally. No carotid  bruits   Diagnostic Studies Duplex ultrasound was ordered and reviewed. This shows less than 40% right carotid stenosis and greater than 80% left carotid stenosis. The velocity profile has increased in her left carotid system. Peak systolic velocity is 5 66 cm/s. Peak diastolic velocity is 20 7 cm/s  Assessment: Asymptomatic left carotid stenosis Plan:  I have been following the patient for many years for asymptomatic left carotid stenosis. She has undergone an attempt at carotid stenting, however due to vessel tortuosity, I was unable to gain access to the artery for stenting. She remains asymptomatic, however today her velocity profile has significantly increased on ultrasound. I have asked her to speak with Dr. Sherene Sires and Dr. Jens Som regarding possible surgical clearance should she become symptomatic or continue with her progressive stenosis. I have scheduled her to come back in 6 months for further evaluation and repeat ultrasound   V. Charlena Cross, M.D. [facility] [place] Office: [phone] Pager:  [phone]

## 2012-10-13 NOTE — Addendum Note (Signed)
Addended by: Sharee Pimple on: 10/13/2012 09:42 AM   Modules accepted: Orders

## 2012-11-18 ENCOUNTER — Other Ambulatory Visit: Payer: Self-pay | Admitting: Internal Medicine

## 2012-11-27 ENCOUNTER — Telehealth: Payer: Self-pay | Admitting: Cardiology

## 2012-11-27 NOTE — Telephone Encounter (Signed)
Spoke with pt, she reports noticing yesterday there was a knot the size of a nickel below her belly button. She also reports bruising of the abdomen that starts at the top of her pubic hair and reaches to the belly button. She has been nauseated for the last 3 to 4 days. She is aware dr Jens Som is not here Monday. She denies any pain and does not recall bumping into anything. She wants to know if she should have an abdominal ultrasound to look at her aneurysm. Will discuss with dr Jens Som

## 2012-11-27 NOTE — Telephone Encounter (Signed)
Pt calling re appt 10-16, wants to come Monday due to a blue knot on her stomach, denies any symptoms

## 2012-11-27 NOTE — Telephone Encounter (Signed)
Discussed with dr Jens Som, Spoke with pt, aware not related to aneurysm. She will call her PCP

## 2012-12-03 ENCOUNTER — Other Ambulatory Visit: Payer: Self-pay

## 2012-12-03 MED ORDER — FUROSEMIDE 20 MG PO TABS: ORAL_TABLET | ORAL | Status: DC

## 2012-12-03 MED ORDER — ISOSORBIDE MONONITRATE ER 30 MG PO TB24: 30.0000 mg | ORAL_TABLET | Freq: Every day | ORAL | Status: DC

## 2012-12-10 ENCOUNTER — Ambulatory Visit (INDEPENDENT_AMBULATORY_CARE_PROVIDER_SITE_OTHER): Payer: Medicare Other | Admitting: Cardiology

## 2012-12-10 ENCOUNTER — Encounter: Payer: Self-pay | Admitting: Cardiology

## 2012-12-10 VITALS — BP 154/86 | HR 90 | Ht 65.0 in | Wt 175.0 lb

## 2012-12-10 DIAGNOSIS — I4891 Unspecified atrial fibrillation: Secondary | ICD-10-CM

## 2012-12-10 DIAGNOSIS — I498 Other specified cardiac arrhythmias: Secondary | ICD-10-CM

## 2012-12-10 DIAGNOSIS — I472 Ventricular tachycardia: Secondary | ICD-10-CM

## 2012-12-10 DIAGNOSIS — I2510 Atherosclerotic heart disease of native coronary artery without angina pectoris: Secondary | ICD-10-CM

## 2012-12-10 DIAGNOSIS — I10 Essential (primary) hypertension: Secondary | ICD-10-CM

## 2012-12-10 DIAGNOSIS — I679 Cerebrovascular disease, unspecified: Secondary | ICD-10-CM

## 2012-12-10 LAB — BASIC METABOLIC PANEL
BUN: 13 mg/dL (ref 6–23)
CO2: 28 mEq/L (ref 19–32)
Calcium: 9.3 mg/dL (ref 8.4–10.5)
Chloride: 98 mEq/L (ref 96–112)
Creatinine, Ser: 0.8 mg/dL (ref 0.4–1.2)
Glucose, Bld: 95 mg/dL (ref 70–99)
Potassium: 3.8 mEq/L (ref 3.5–5.1)

## 2012-12-10 LAB — HEPATIC FUNCTION PANEL
ALT: 28 U/L (ref 0–35)
AST: 36 U/L (ref 0–37)
Albumin: 4 g/dL (ref 3.5–5.2)
Bilirubin, Direct: 0 mg/dL (ref 0.0–0.3)
Total Protein: 7.4 g/dL (ref 6.0–8.3)

## 2012-12-10 LAB — CBC WITH DIFFERENTIAL/PLATELET
Basophils Relative: 0.2 % (ref 0.0–3.0)
Eosinophils Relative: 0.7 % (ref 0.0–5.0)
HCT: 39.5 % (ref 36.0–46.0)
Hemoglobin: 12.7 g/dL (ref 12.0–15.0)
Lymphocytes Relative: 13.3 % (ref 12.0–46.0)
Lymphs Abs: 1.4 10*3/uL (ref 0.7–4.0)
Monocytes Relative: 8.3 % (ref 3.0–12.0)
Neutro Abs: 8 10*3/uL — ABNORMAL HIGH (ref 1.4–7.7)
RBC: 4.71 Mil/uL (ref 3.87–5.11)

## 2012-12-10 NOTE — Progress Notes (Signed)
HPI: Pleasant female with a history of CAD, hypertension, hyperlipidemia, SVT, AAA, COPD, bladder cancer and cerebrovascular disease. She presented to [facility] in early February 2012 for a COPD exacerbation and possible diastolic heart failure. She was noted to have wide complex tachycardia that was felt to be ventricular tachycardia and she was transferred to [facility]. Cardiac catheterization on 04/06/10 demonstrated a totally occluded LAD with right to left collaterals, a nodular density in the prox CFX that occupied about 70% of the prox vessel, and preserved LVF. It was felt that her CAD should be treated medically. She was evaluated by Dr. Ladona Ridgel. It was felt that her ventricular tachycardia would also be treated medically with adjustments in her beta blocker therapy. Ultrasound 12/13 revealed 3.8 by 3.8 cm AAA; fu one year. The patient has previous attempt at carotid stent on the left. The procedure was unsuccessful due to anatomy; followed by vascular surgery. Most recent carotid Dopplers showed an 80-99% left stenosis and less than 40% right. Repeat echocardiogram in March of 2013 showed normal LV function. There was trace aortic insufficiency and mild left atrial enlargement. Patient also on amiodarone for h/o SVT. I last saw her in March 2014. Since then, the patient does have dyspnea on exertion but there is no orthopnea, PND, pedal edema, palpitations, syncope or chest pain. Her dyspnea is worse compared to previous.    Current Outpatient Prescriptions  Medication Sig Dispense Refill  . acetaminophen-codeine (TYLENOL #3) 300-30 MG per tablet Take 1 tablet by mouth every 8 (eight) hours as needed.       Marland Kitchen amiodarone (PACERONE) 200 MG tablet Take 200 mg by mouth at bedtime.       Marland Kitchen arformoterol (BROVANA) 15 MCG/2ML NEBU Take 2 mLs (15 mcg total) by nebulization 2 (two) times daily. Dx: 496  120 mL  11  . aspirin EC 81 MG tablet Take 81 mg by mouth at bedtime.      .  budesonide (PULMICORT) 0.25 MG/2ML nebulizer solution Take 2 mLs (0.25 mg total) by nebulization 2 (two) times daily. Dx: 496  120 mL  11  . budesonide-formoterol (SYMBICORT) 160-4.5 MCG/ACT inhaler Inhale 2 puffs into the lungs 2 (two) times daily.      . clopidogrel (PLAVIX) 75 MG tablet Take 1 tablet (75 mg total) by mouth at bedtime.      Marland Kitchen diltiazem (CARDIZEM CD) 300 MG 24 hr capsule Take 1 capsule (300 mg total) by mouth daily. IN EVENING  30 capsule  6  . furosemide (LASIX) 20 MG tablet TAKE (1) TABLET BY MOUTH ONCE DAILY AS NEEDED.  30 tablet  6  . ipratropium (ATROVENT) 0.02 % nebulizer solution USE 1 VIAL IN NEBULIZER EVERY 6 HOURS.  300 mL  2  . isosorbide mononitrate (IMDUR) 30 MG 24 hr tablet Take 1 tablet (30 mg total) by mouth at bedtime.  30 tablet  9  . levalbuterol (XOPENEX HFA) 45 MCG/ACT inhaler Inhale 2 puffs into the lungs every 4 (four) hours as needed for wheezing.  8.4 g  0  . nitroGLYCERIN (NITROSTAT) 0.4 MG SL tablet Place 0.4 mg under the tongue every 5 (five) minutes as needed. Chest pain      . pantoprazole (PROTONIX) 40 MG tablet TAKE 1 TABLET BY MOUTH DAILY--TAKE 30 TO 60 MINUTES BEFORE FIRST MEALOF THE DAY.  30 tablet  1  . potassium chloride SA (K-DUR,KLOR-CON) 20 MEQ tablet Take 20 mEq by mouth 2 (two) times daily.       Marland Kitchen  promethazine (PHENERGAN) 25 MG tablet Take 25 mg by mouth every 4 (four) hours as needed. For nausea      . ranitidine (ZANTAC) 150 MG tablet Take 1 tablet (150 mg total) by mouth at bedtime.  30 tablet  2  . Rosuvastatin Calcium (CRESTOR PO) Take 20 mg by mouth daily.       . travoprost, benzalkonium, (TRAVATAN) 0.004 % ophthalmic solution Place 1 drop into both eyes at bedtime.       . Aspirin-Acetaminophen-Caffeine (GOODYS EXTRA STRENGTH) [phone] MG PACK As directed       No current facility-administered medications for this visit.   Facility-Administered Medications Ordered in Other Visits  Medication Dose Route Frequency Provider Last  Rate Last Dose  . mitomycin (MUTAMYCIN) chemo injection 40 mg  40 mg Bladder Instillation Once Antony Haste, MD         Past Medical History  Diagnosis Date  . Hypertension   . Hyperlipidemia   . Cerebrovascular disease, unspecified   . Unspecified glaucoma(365.9)   . Obesity, unspecified   . Coronary artery disease     cath 04/06/10: LAD occluded with R-L collats; med Rx WTC....probable V. Tach...med Rx  . Ventricular tachycardia   . SVT (supraventricular tachycardia)   . AAA (abdominal aortic aneurysm)   . COPD (chronic obstructive pulmonary disease) 2013    [facility]  . Shortness of breath     WITH ACTIVITY--USES OXYGEN AS NEEDED  2&1/2 L PER NASAL CANNUL  . Headache(784.0)     PAST HX MIGRAINES  . Arthritis   . Complication of anesthesia     PT STATES SHE CAN NOT BE PUT TO SLEEP BECAUSE OF HER LUNG PROBLEMS  . Carotid artery occlusion   . 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 Cystoscopy    . Bladder tumor excision      resection of 5 bladder, and fulguration of 1 small bladder tumor  . Cystoscopy      cold cup bladder biopsy of five tumors, and fulguration of one tumor  . Lung surgery      Remote right lung  . Tubal ligation      Bilateral  . Cystoscopy with biopsy  12/24/2011    Procedure: CYSTOSCOPY WITH BIOPSY;  Surgeon: Antony Haste, MD;  Location: [facility];  Service: Urology;  Laterality: N/A;    History   Social History  . Marital Status: Married    Spouse Name: N/A    Number of Children: 2  . Years of Education: N/A   Occupational History  . Retired     Social History Main Topics  . Smoking status: Former Smoker -- 0.30 packs/day for 58 years    Types: Cigarettes    Quit date: 05/18/2011  . Smokeless tobacco: Never Used  . Alcohol Use: No  . Drug Use: No  . Sexual Activity: No   Other Topics Concern  . Not on file   Social History Narrative   Lives in [place], [place]  with husband.     ROS: no fevers or chills, productive cough, hemoptysis, dysphasia, odynophagia, melena, hematochezia, dysuria, hematuria, rash, seizure activity, orthopnea, PND, pedal edema, claudication. Remaining systems are negative.  Physical Exam: Well-developed chronically ill appearing in no acute distress.  Skin is warm and dry.  HEENT is normal.  Neck is supple.  Chest with diminished BS throughout Cardiovascular exam is regular rate and rhythm.  Abdominal  exam nontender or distended. No masses palpated. Extremities show no edema. neuro grossly intact  ECG sinus rhythm with right bundle branch block.

## 2012-12-10 NOTE — Assessment & Plan Note (Signed)
Repeat abdominal ultrasound in December 2014.

## 2012-12-10 NOTE — Assessment & Plan Note (Signed)
Continue aspirin and statin. Check hemoglobin.

## 2012-12-10 NOTE — Assessment & Plan Note (Addendum)
Patient has severe left carotid stenosis. I have been asked to assess her preoperatively for consideration of carotid endarterectomy. Given her vascular disease, severe COPD and coronary disease I feel that she would be high risk for any procedure. She is also scheduled to see pulmonary for preoperative evaluation. I will leave further discussions concerning whether to proceed to she and Dr. Myra Gianotti. She understands she would be high risk for any procedure. Continue aspirin and statin.

## 2012-12-10 NOTE — Assessment & Plan Note (Signed)
Continue statin. 

## 2012-12-10 NOTE — Assessment & Plan Note (Signed)
Blood pressure mildly elevated but she states typically controlled. Continue present medications. 

## 2012-12-10 NOTE — Patient Instructions (Signed)
Your physician wants you to follow-up in: 6 MONTHS WITH DR CRENSHAW You will receive a reminder letter in the mail two months in advance. If you don't receive a letter, please call our office to schedule the follow-up appointment.   Your physician recommends that you HAVE LAB WORK TODAY  A chest x-ray takes a picture of the organs and structures inside the chest, including the heart, lungs, and blood vessels. This test can show several things, including, whether the heart is enlarges; whether fluid is building up in the lungs; and whether pacemaker / defibrillator leads are still in place. AT [address] [facility] 

## 2012-12-10 NOTE — Assessment & Plan Note (Signed)
Continue amiodarone. 

## 2012-12-10 NOTE — Assessment & Plan Note (Signed)
Continue amiodarone. Check TSH, liver functions and chest x-ray. Will also check potassium and renal function.

## 2012-12-14 ENCOUNTER — Ambulatory Visit (INDEPENDENT_AMBULATORY_CARE_PROVIDER_SITE_OTHER)
Admission: RE | Admit: 2012-12-14 | Discharge: 2012-12-14 | Disposition: A | Discharge status: Home / Self Care | Payer: Medicare Other | Source: Ambulatory Visit | Attending: Cardiology | Admitting: Cardiology

## 2012-12-14 ENCOUNTER — Ambulatory Visit (INDEPENDENT_AMBULATORY_CARE_PROVIDER_SITE_OTHER): Payer: Medicare Other | Admitting: Internal Medicine

## 2012-12-14 ENCOUNTER — Encounter: Payer: Self-pay | Admitting: Internal Medicine

## 2012-12-14 VITALS — BP 130/84 | HR 95 | Temp 98.4°F | Ht 65.0 in | Wt 175.0 lb

## 2012-12-14 DIAGNOSIS — J449 Chronic obstructive pulmonary disease, unspecified: Secondary | ICD-10-CM

## 2012-12-14 DIAGNOSIS — I2510 Atherosclerotic heart disease of native coronary artery without angina pectoris: Secondary | ICD-10-CM

## 2012-12-14 DIAGNOSIS — I4891 Unspecified atrial fibrillation: Secondary | ICD-10-CM

## 2012-12-14 DIAGNOSIS — J9610 Chronic respiratory failure, unspecified whether with hypoxia or hypercapnia: Secondary | ICD-10-CM

## 2012-12-14 LAB — PULMONARY FUNCTION TEST

## 2012-12-14 MED ORDER — BUDESONIDE-FORMOTEROL FUMARATE 160-4.5 MCG/ACT IN AERO: 2.0000 | INHALATION_SPRAY | Freq: Two times a day (BID) | RESPIRATORY_TRACT | Status: DC

## 2012-12-14 NOTE — Progress Notes (Addendum)
Subjective:    Patient ID: [name] [name], female    DOB: 10-03-1940    MRN: [id number]  Brief patient profile:  72 yowf quit smoking  04/2011 dx'd with stage III COPD in 2004.  HPI 07/18/2011 f/u ov/Wert cc w/c bound due to weakness and 02 3lpm 24 hours per day. No cough, confused with meds/ names (only used saba so far on day of ov, not yet gotten around to symbicort) but seems to be getting along ok at this point.   rec Symbicort Take 2 puffs first thing in am and then another 2 puffs about 12 hours later.  Work on inhaler technique:   .    Only use your albuterol as a rescue medication   05/26/2012 f/u ov/Wert cc Chief Complaint  Patient presents with  . COPD    Breathing is unchanged.   doe x room to room even on 4lpm and self titrating 02 at other times rec No change rx Use med calendar      08/24/2012 f/u ov/Wert re copd/ using med calendar better in terms of maint but not prns Chief Complaint  Patient presents with  . 3 month follow up    increased SOB all the time - worsened over the past 2 wks.  Also has wheezing, chest tightness/chest pain, and prod cough with white mucus.    no def change in pain or cough, just the breathing, was fine on 6/27, much worse since assoc with hoarseness and ? Overt HB  >PPI and pepcid   09/07/2012 Follow up and Med review  Returns for follow up and med review .  - pt brought all meds with her today.  We reviewed all her medications and organized them into a medication calendar with patient education. Patient continues to complain of difficulty using her Symbicort inhaler. We discussed switching Symbicort over to, nebulized medications. Patient also had difficulty with Pepcid and has responded to switch over to Zantac with improvement. Patient denies any hemoptysis, orthopnea, PND, or leg swelling. Rec Follow med calendar closely and bring to each visit.  Switch Symbicort to Budesonide/Brovana Neb Twice daily    12/14/2012 f/u ov/Wert re:  copd GOLD III with reversibility/ chronic 02 dep Chief Complaint  Patient presents with  . Follow-up    PFT done today.  Still having SOB    Not following med calendar, no better on "the last neb you gave me" so back on symbicort, Not using 02 as rec Sob x 50 ft on 2lpm (was supposed to be on 3lpm per med calendar   No obvious day to day or daytime variabilty or assoc chronic cough or cp or chest tightness, subjective wheeze overt sinus or hb symptoms. No unusual exp hx or h/o childhood pna/ asthma or knowledge of premature birth.  Sleeping ok without nocturnal  or early am exacerbation  of respiratory  c/o's or need for noct saba. Also denies any obvious fluctuation of symptoms with weather or environmental changes or other aggravating or alleviating factors except as outlined above   Current Medications, Allergies, Complete Past Medical History, Past Surgical History, Family History, and Social History were reviewed in [employer] record.  ROS  The following are not active complaints unless bolded sore throat, dysphagia, dental problems, itching, sneezing,  nasal congestion or excess/ purulent secretions, ear ache,   fever, chills, sweats, unintended wt loss, pleuritic or exertional cp, hemoptysis,  orthopnea pnd or leg swelling, presyncope, palpitations, heartburn, abdominal pain, anorexia,  nausea, vomiting, diarrhea  or change in bowel or urinary habits, change in stools or urine, dysuria,hematuria,  rash, arthralgias, visual complaints, headache, numbness weakness or ataxia or problems with walking or coordination,  change in mood/affect or memory.                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)  HYPERLIPIDEMIA (ICD-272.4)  HYPERTENSION (ICD-401.9)  CEREBROVASCULAR DISEASE (ICD-437.9)  - 80% R Carotid stenosis - see note by Bradham11/16/11  GLAUCOMA (ICD-365.9)  OBESITY (ICD-278.00)  COPD (ICD-496)  - PFT's 12/10/2002 FEV1  .85(37%) ratio 47 and 11 % better p B2, DLC0 42%  - Walking sats January 25, 2010 > desat even on 2lpm > rec 2.5 lpm 24 h per day                        Objective:   Physical Exam   Wt Readings from Last 3 Encounters:  12/14/12 175 lb (79.379 kg)  12/10/12 175 lb (79.379 kg)  10/12/12 175 lb (79.379 kg)         GEN: elderly w/c bound, chronically ill appearing      HEENT mild turbinate edema.  Oropharynx no thrush or excess pnd or cobblestoning.  No JVD or cervical adenopathy. Mild accessory muscle hypertrophy. Trachea midline, nl thryroid. Chest was hyperinflated by percussion with diminished breath sounds and moderate increased exp time without wheeze. Hoover sign positive at mid inspiration. Regular rate and rhythm without murmur gallop or rub or increase P2 or edema.  Abd: no hsm, nl excursion. Ext warm without cyanosis or clubbing.       CXR   12/14/12 No active cardiopulmonary disease. Stable chronic findings            Assessment & Plan:

## 2012-12-14 NOTE — Patient Instructions (Addendum)
See calendar for specific medication instructions and bring it back for each and every office visit for every healthcare provider you see.  Without it,  you may not receive the best quality medical care that we feel you deserve.  Work on inhaler technique:  relax and gently blow all the way out then take a nice smooth deep breath back in, triggering the inhaler at same time you start breathing in.  Hold for up to 5 seconds if you can.  Rinse and gargle with water when done    Increase 02 to 4lpm with exertion but ok to use 2lpm at rest and sleeping  If you would like me to help you I will ask you to bring them in two bags (the two groups on the calendar) plus your pill boxes and all your inhalers or solutions

## 2012-12-14 NOTE — Progress Notes (Signed)
Spirometry before and after done today.  Pt was unable to perform lung volumes and DLCO. 

## 2012-12-15 NOTE — Assessment & Plan Note (Signed)
-  Walking sats January 25, 2010 > desat even on 2lpm   -10/25/2011  Sat 90% at rest >  Walked 2lpm x one lap @ 185 stopped due to  Sob/ fatigue but no desat so changed to ok to leave off at rest -05/26/2012   Walked 4lp  x one half lap = 90 ft,  stopped due to  Fatigue, no desat -12/14/2012   Walked 2lpm x one lap @ 185 stopped due to  desat  To 85%  rec 2lpm at rest, 4lpm with exertion as of 12/14/12

## 2012-12-15 NOTE — Assessment & Plan Note (Signed)
-   PFT's 12/10/2002 FEV1 .85(37%) ratio 47 and 11 % better p B2, DLC0 42%  - PFTs 12/14/2012  FEV1 0.49 (21%) improves by 31%  - Rehab rec 05/28/12 > pt declined - HFA 50% p coaching 08/24/2012  - med calendar 02/10/12 , 09/07/2012  -changed symbicort to neb B/B  09/07/12 but worse so back to symbicort   The proper method of use, as well as anticipated side effects, of a metered-dose inhaler are discussed and demonstrated to the patient. Improved effectiveness after extensive coaching during this visit to a level of approximately  50%  I had an extended discussion with the patient today lasting 15 to 20 minutes of a 25 minute visit on the following issues:  She is not willing to meet [place] half way in terms of maintaining med reconciliation/ verification so until she is there's no reason to follow her back here.  The irony is that she does continue to show significant reversibility and if she were compliant could have a much better qol.  See instructions for specific recommendations which were reviewed directly with the patient who was given a copy with highlighter outlining the key components.

## 2013-01-01 ENCOUNTER — Ambulatory Visit (INDEPENDENT_AMBULATORY_CARE_PROVIDER_SITE_OTHER): Payer: Medicare Other | Admitting: Adult Health

## 2013-01-01 VITALS — BP 124/74 | HR 88 | Temp 97.3°F | Ht 65.0 in | Wt 178.4 lb

## 2013-01-01 DIAGNOSIS — J449 Chronic obstructive pulmonary disease, unspecified: Secondary | ICD-10-CM

## 2013-01-01 NOTE — Assessment & Plan Note (Addendum)
Controlled on present regimen  Refer to pulmonary rehab Patient's medications were reviewed today and patient education was given. Computerized medication calendar was adjusted/completed  Plan  Follow med calendar closely and bring to each visit.  Refer to pulmonary rehab DME to replace portable concentrator.  Follow up in 4  months and As needed

## 2013-01-01 NOTE — Progress Notes (Signed)
Subjective:    Patient ID: [name] [name], female    DOB: 10/09/1940    MRN: [id number]  Brief patient profile:  72 yowf quit smoking  04/2011 dx'd with stage III COPD in 2004.  HPI 07/18/2011 f/u ov/Wert cc w/c bound due to weakness and 02 3lpm 24 hours per day. No cough, confused with meds/ names (only used saba so far on day of ov, not yet gotten around to symbicort) but seems to be getting along ok at this point.   rec Symbicort Take 2 puffs first thing in am and then another 2 puffs about 12 hours later.  Work on inhaler technique:   .    Only use your albuterol as a rescue medication   05/26/2012 f/u ov/Wert cc Chief Complaint  Patient presents with  . COPD    Breathing is unchanged.   doe x room to room even on 4lpm and self titrating 02 at other times rec No change rx Use med calendar      08/24/2012 f/u ov/Wert re copd/ using med calendar better in terms of maint but not prns Chief Complaint  Patient presents with  . 3 month follow up    increased SOB all the time - worsened over the past 2 wks.  Also has wheezing, chest tightness/chest pain, and prod cough with white mucus.    no def change in pain or cough, just the breathing, was fine on 6/27, much worse since assoc with hoarseness and ? Overt HB  >PPI and pepcid   09/07/2012 Follow up and Med review  Returns for follow up and med review .  - pt brought all meds with her today.  We reviewed all her medications and organized them into a medication calendar with patient education. Patient continues to complain of difficulty using her Symbicort inhaler. We discussed switching Symbicort over to, nebulized medications. Patient also had difficulty with Pepcid and has responded to switch over to Zantac with improvement. Patient denies any hemoptysis, orthopnea, PND, or leg swelling. Rec Follow med calendar closely and bring to each visit.  Switch Symbicort to Budesonide/Brovana Neb Twice daily    12/14/2012 f/u ov/Wert re:  copd GOLD III with reversibility/ chronic 02 dep Chief Complaint  Patient presents with  . Follow-up    PFT done today.  Still having SOB    Not following med calendar, no better on "the last neb you gave me" so back on symbicort, Not using 02 as rec Sob x 50 ft on 2lpm (was supposed to be on 3lpm per med calendar >Increase 02 to 4lpm with exertion but ok to use 2lpm at rest and sleeping   01/01/2013 Follow up and Med Review  Hx of COPD GOLD III with reversibility/ chronic 02 dep. Returns for follow up and med review .  Pt brought all meds with her today.  We reviewed all her medications and organized them into a medication calendar with patient education. Having more trouble with portable concentrator not charging.  Feels breathing is doing better on Symbicort.  We discussed pulmonary rehab, she agreed to referral.  No chest pain , orthopnea, edema, abd pain , n/v/d.    Current Medications, Allergies, Complete Past Medical History, Past Surgical History, Family History, and Social History were reviewed in [employer] record.  ROS  The following are not active complaints unless bolded sore throat, dysphagia, dental problems, itching, sneezing,  nasal congestion or excess/ purulent secretions, ear ache,   fever, chills,  sweats, unintended wt loss, pleuritic or exertional cp, hemoptysis,  orthopnea pnd or leg swelling, presyncope, palpitations, heartburn, abdominal pain, anorexia, nausea, vomiting, diarrhea  or change in bowel or urinary habits, change in stools or urine, dysuria,hematuria,  rash, arthralgias, visual complaints, headache, numbness weakness or ataxia or problems with walking or coordination,  change in mood/affect or memory.         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)  HYPERLIPIDEMIA (ICD-272.4)  HYPERTENSION (ICD-401.9)  CEREBROVASCULAR DISEASE (ICD-437.9)  - 80% R Carotid stenosis - see note by Bradham11/16/11   GLAUCOMA (ICD-365.9)  OBESITY (ICD-278.00)  COPD (ICD-496)  - PFT's 12/10/2002 FEV1 .85(37%) ratio 47 and 11 % better p B2, DLC0 42%  - Walking sats January 25, 2010 > desat even on 2lpm > rec 2.5 lpm 24 h per day  -Med Calendar 01/01/2013       Objective:   Physical Exam    GEN: elderly w/c bound, chronically ill appearing      HEENT mild turbinate edema.  Oropharynx no thrush or excess pnd or cobblestoning.  No JVD or cervical adenopathy. Mild accessory muscle hypertrophy. Trachea midline, nl thryroid. Chest was hyperinflated by percussion with diminished breath sounds and moderate increased exp time without wheeze. Hoover sign positive at mid inspiration. Regular rate and rhythm without murmur gallop or rub or increase P2 or edema.  Abd: no hsm, nl excursion. Ext warm without cyanosis or clubbing.       CXR   12/14/12 No active cardiopulmonary disease. Stable chronic findings            Assessment & Plan:

## 2013-01-01 NOTE — Patient Instructions (Signed)
Follow med calendar closely and bring to each visit.  Refer to pulmonary rehab DME to replace portable concentrator.  Follow up in 4  months and As needed

## 2013-01-05 ENCOUNTER — Encounter: Payer: Self-pay | Admitting: Internal Medicine

## 2013-01-19 ENCOUNTER — Other Ambulatory Visit: Payer: Self-pay | Admitting: Internal Medicine

## 2013-02-02 IMAGING — CR DG CHEST 1V PORT
1 series · 1 of 1 positions shown · non-contrast
Comparison: 05/21/2011.

CLINICAL DATA: Intubated.

PORTABLE CHEST - 1 VIEW

[AP]
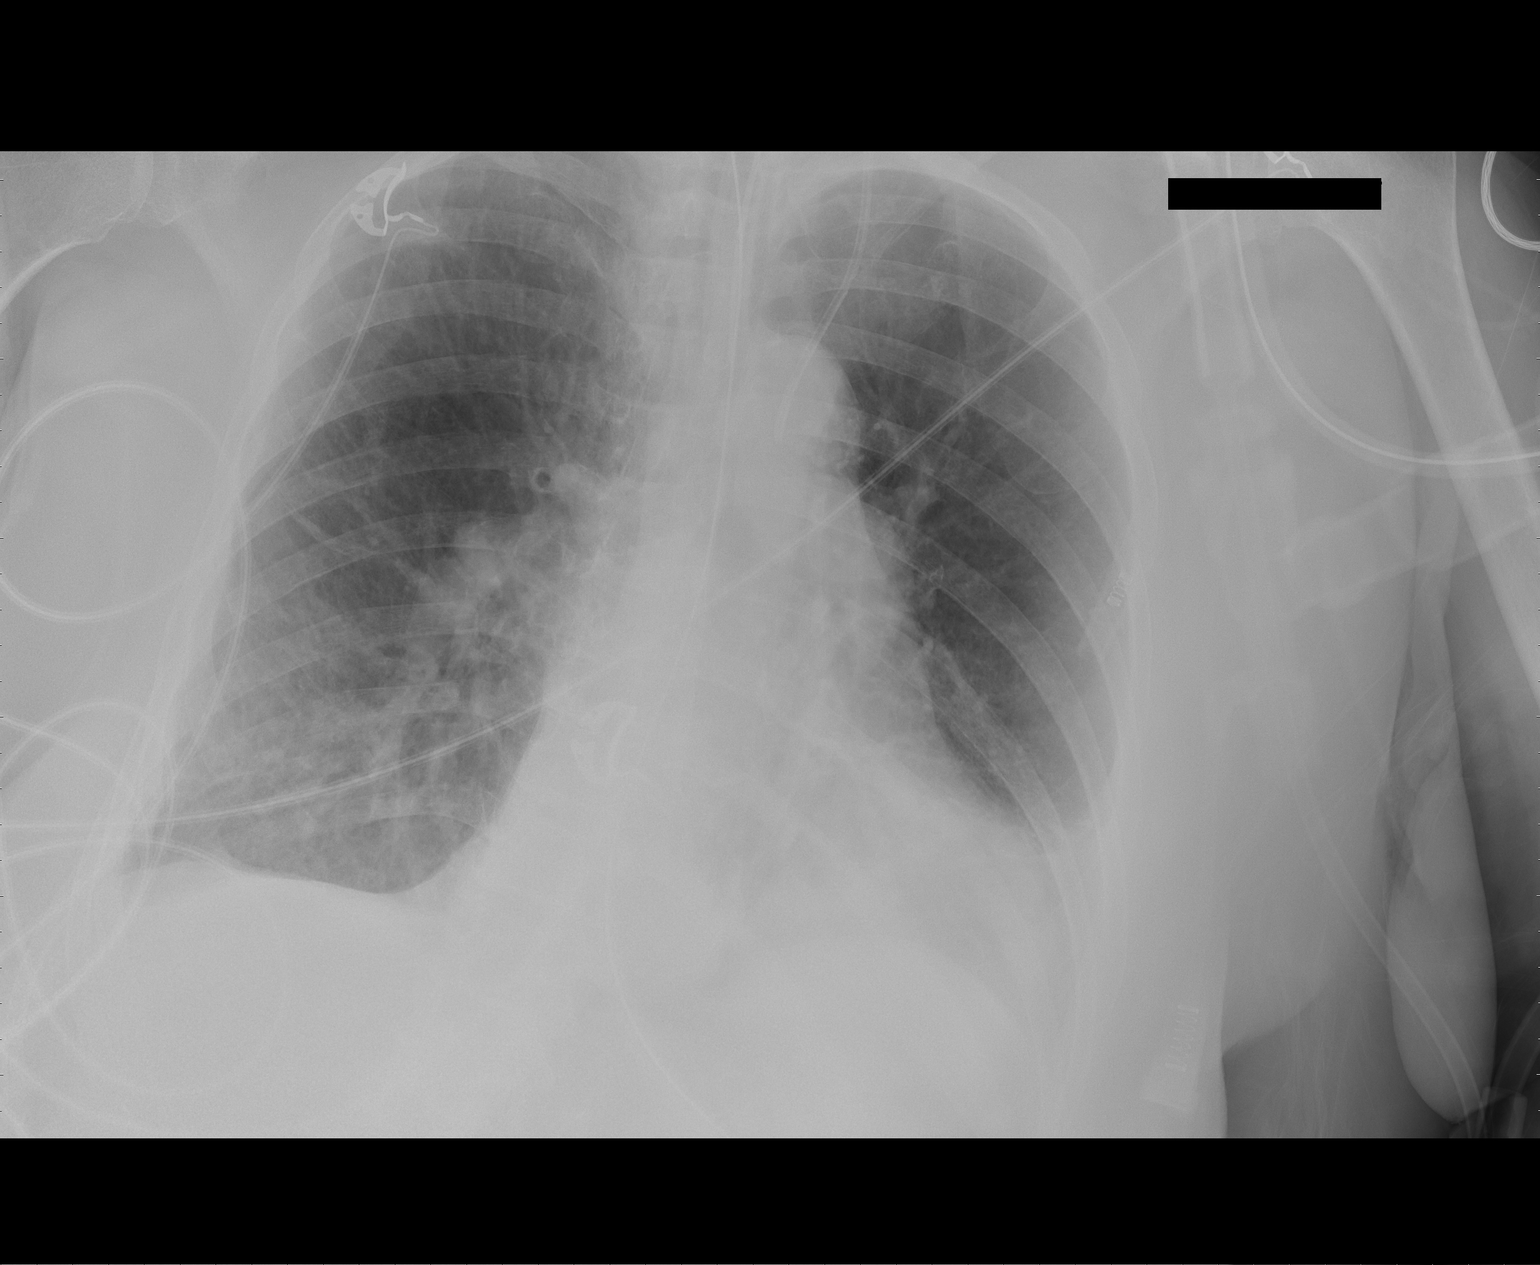

[1 of 1 positions shown; findings below may reference images not displayed]

FINDINGS: Endotracheal tube is in satisfactory position.
Nasogastric tube is followed into the stomach.  Left IJ central
line tip projects over the SVC, when patient rotation is considered
and comparison is made with 05/21/2011.

Heart size stable.  Bibasilar air space disease, left greater than
right, with probable bilateral pleural effusions.
IMPRESSION: Persistent bibasilar air space disease and probable small bilateral
pleural effusions.

## 2013-02-03 IMAGING — CR DG CHEST 1V PORT
1 series · 1 of 1 positions shown · non-contrast
Comparison: 05/22/2011

CLINICAL DATA: Hilar prominence.

PORTABLE CHEST - 1 VIEW

[AP]
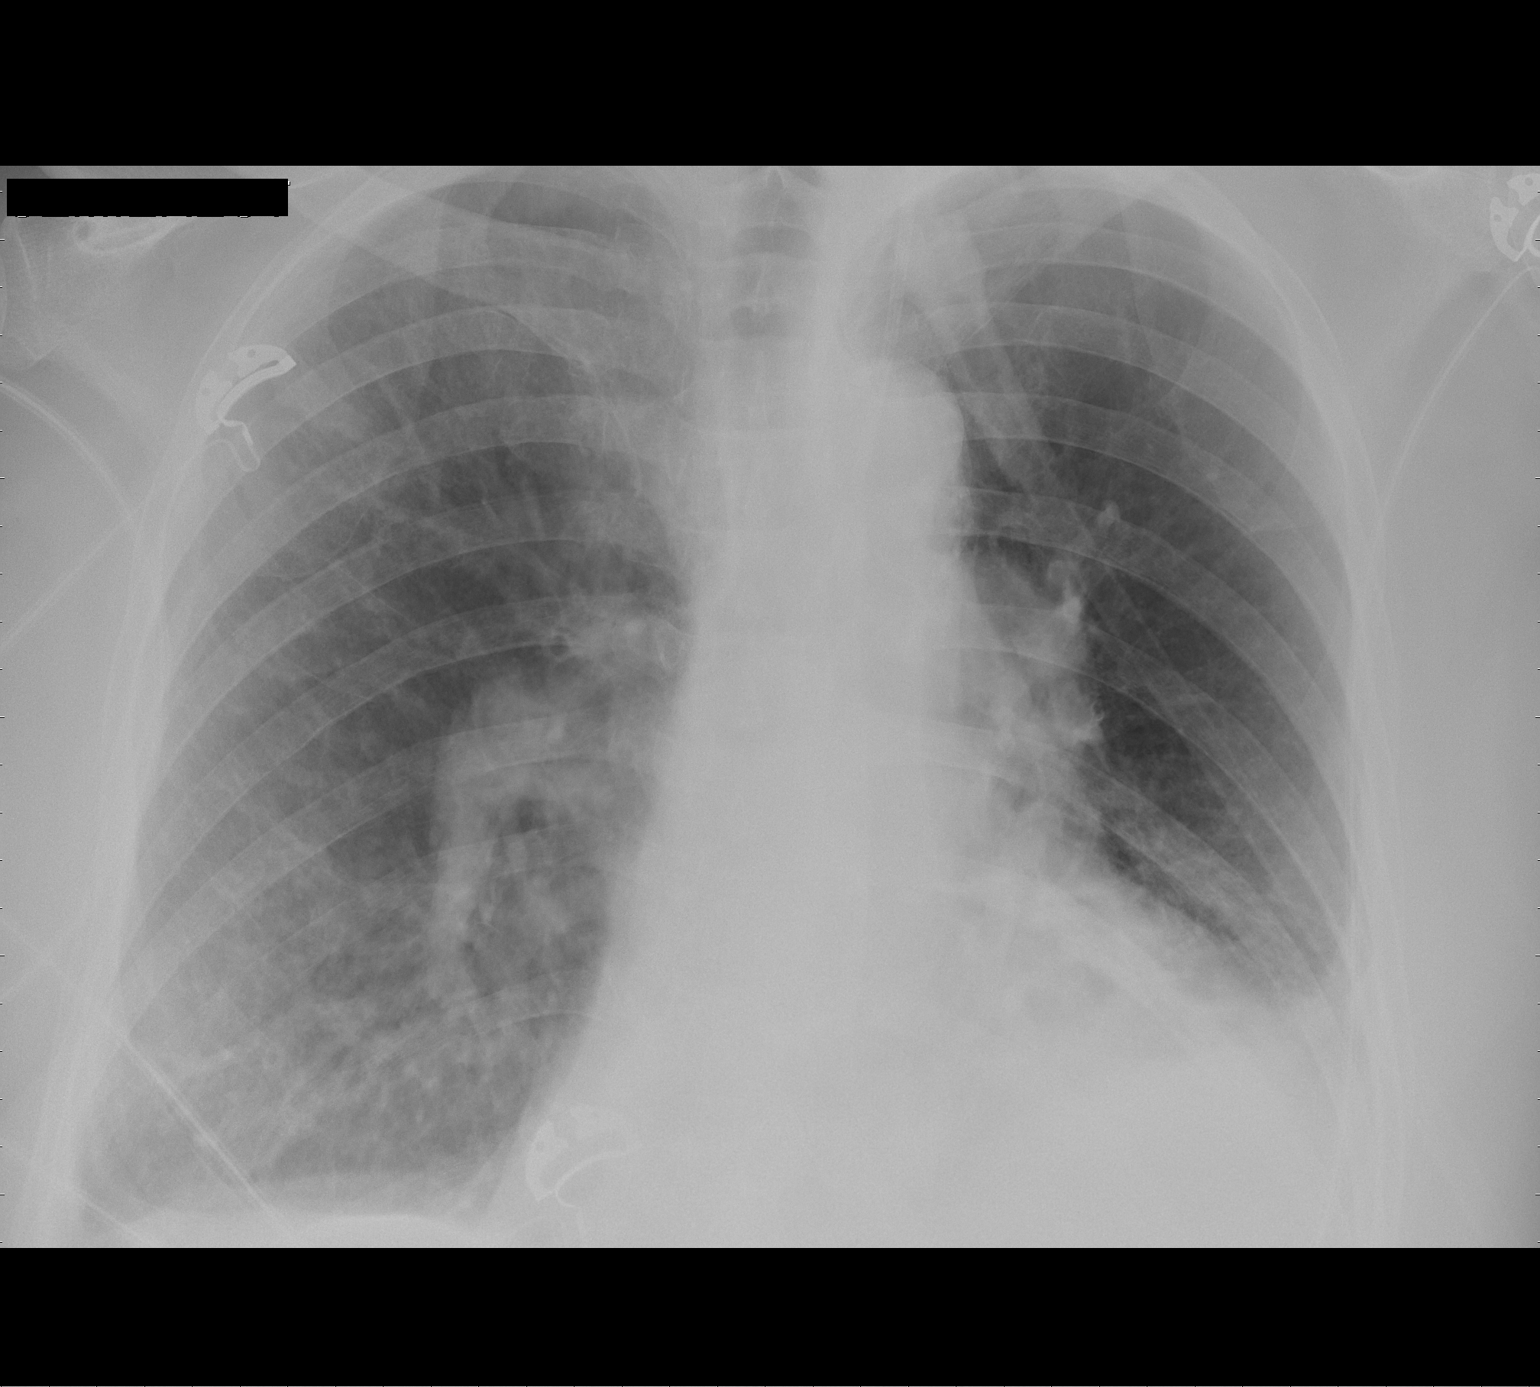

[1 of 1 positions shown; findings below may reference images not displayed]

FINDINGS: There is hyperinflation of the lungs compatible with
COPD.  Cardiomegaly.  Left base atelectasis or infiltrate.  Minimal
right base atelectasis.  Mild bilateral hilar prominence likely is
vascular, presumably pulmonary arterial hypertension.  No real
change since prior study.  Interval removal of NG tube.
IMPRESSION: No significant change.

## 2013-02-05 ENCOUNTER — Ambulatory Visit (HOSPITAL_COMMUNITY): Payer: Medicare Other | Attending: Internal Medicine

## 2013-02-05 ENCOUNTER — Encounter: Payer: Self-pay | Admitting: Internal Medicine

## 2013-02-05 DIAGNOSIS — J4489 Other specified chronic obstructive pulmonary disease: Secondary | ICD-10-CM | POA: Insufficient documentation

## 2013-02-05 DIAGNOSIS — I714 Abdominal aortic aneurysm, without rupture, unspecified: Secondary | ICD-10-CM | POA: Insufficient documentation

## 2013-02-05 DIAGNOSIS — J449 Chronic obstructive pulmonary disease, unspecified: Secondary | ICD-10-CM | POA: Insufficient documentation

## 2013-02-05 DIAGNOSIS — I10 Essential (primary) hypertension: Secondary | ICD-10-CM | POA: Insufficient documentation

## 2013-02-05 DIAGNOSIS — I2510 Atherosclerotic heart disease of native coronary artery without angina pectoris: Secondary | ICD-10-CM | POA: Insufficient documentation

## 2013-02-05 DIAGNOSIS — E785 Hyperlipidemia, unspecified: Secondary | ICD-10-CM | POA: Insufficient documentation

## 2013-02-05 DIAGNOSIS — Z87891 Personal history of nicotine dependence: Secondary | ICD-10-CM | POA: Insufficient documentation

## 2013-02-06 IMAGING — CR DG CHEST 1V PORT
1 series · 1 of 1 positions shown · non-contrast
Comparison: 05/23/2011

CLINICAL DATA: COPD and leukocytosis

PORTABLE CHEST - 1 VIEW

[view not recorded]
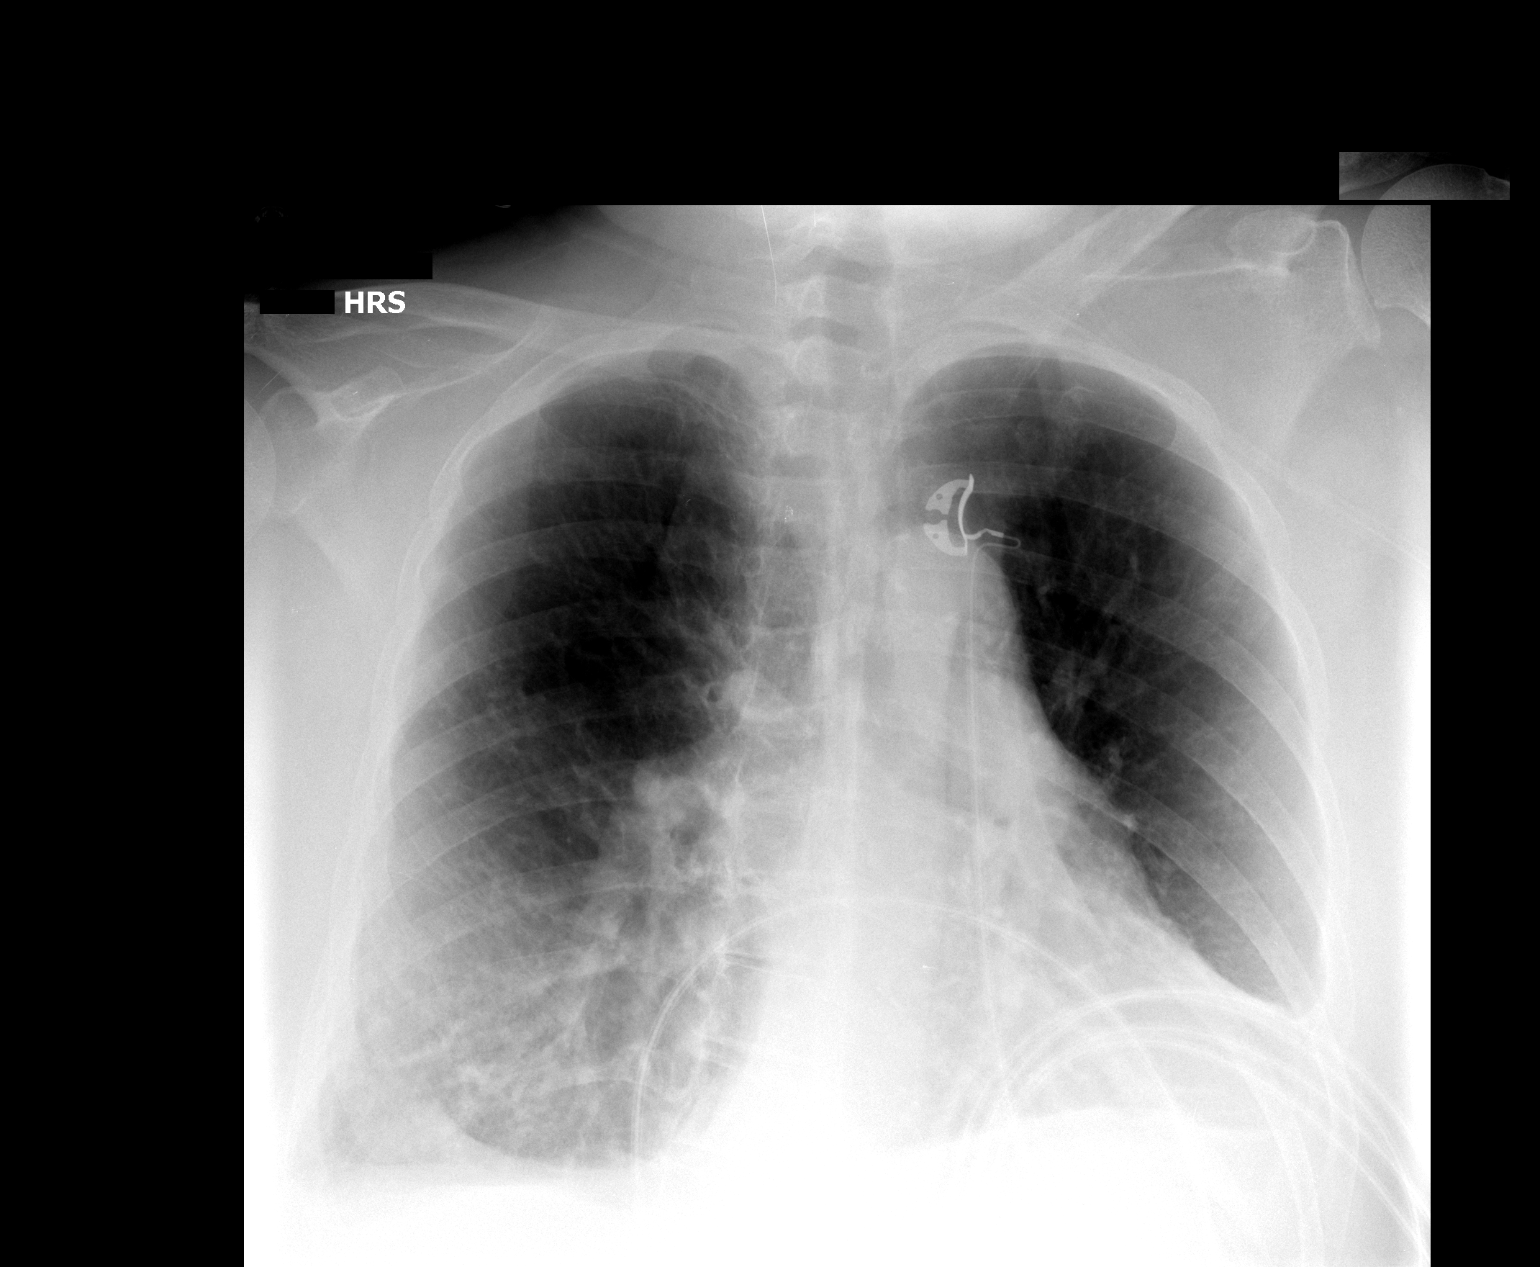

[1 of 1 positions shown; findings below may reference images not displayed]

FINDINGS: Heart size appears normal.

There are low lung volumes.

Coarsened interstitial markings are noted bilaterally.

There is been slight interval improvement in aeration to the left
base.  No change in right base opacities.
IMPRESSION: 1.  Mild improvement in aeration to the right lung base.

## 2013-02-09 ENCOUNTER — Telehealth: Payer: Self-pay | Admitting: Cardiology

## 2013-02-09 NOTE — Telephone Encounter (Addendum)
Patient concerned about the results of the abdominal ultrasound because the aneurysm has increased in size. She states that Stanton Kidney advised her that she would discuss this with Dr.Crenshaw and get back to her. Advised patient that Stanton Kidney is off today and will get back with her in the next few days after discussing this with Dr.Crenshaw. Patient verbalized understanding.

## 2013-02-09 NOTE — Telephone Encounter (Signed)
New problem   Patient calling back to speak with Victorio Palm only . Regarding ultrasound.

## 2013-02-10 NOTE — Telephone Encounter (Signed)
Discussed with dr Jens Som, Sherron Monday with pt, she is aware she would not be a surgical candidate for surgical intervention. She would like it checked again in 6 months. Okay per dr Jens Som

## 2013-04-08 ENCOUNTER — Other Ambulatory Visit: Payer: Self-pay | Admitting: Cardiology

## 2013-04-19 ENCOUNTER — Ambulatory Visit: Payer: Medicare Other | Admitting: Surgery

## 2013-04-19 ENCOUNTER — Other Ambulatory Visit (HOSPITAL_COMMUNITY): Payer: Medicare Other

## 2013-04-23 ENCOUNTER — Encounter: Payer: Self-pay | Admitting: Surgery

## 2013-04-26 ENCOUNTER — Other Ambulatory Visit (HOSPITAL_COMMUNITY): Payer: Medicare Other

## 2013-04-26 ENCOUNTER — Ambulatory Visit: Payer: Medicare Other | Admitting: Surgery

## 2013-05-05 ENCOUNTER — Ambulatory Visit: Payer: Medicare Other | Admitting: Adult Health

## 2013-05-10 ENCOUNTER — Ambulatory Visit (INDEPENDENT_AMBULATORY_CARE_PROVIDER_SITE_OTHER): Payer: Medicare Other | Admitting: Adult Health

## 2013-05-10 ENCOUNTER — Encounter: Payer: Self-pay | Admitting: Adult Health

## 2013-05-10 VITALS — BP 142/84 | HR 90 | Temp 98.5°F

## 2013-05-10 DIAGNOSIS — J9610 Chronic respiratory failure, unspecified whether with hypoxia or hypercapnia: Secondary | ICD-10-CM

## 2013-05-10 DIAGNOSIS — J449 Chronic obstructive pulmonary disease, unspecified: Secondary | ICD-10-CM

## 2013-05-10 NOTE — Progress Notes (Signed)
Subjective:    Patient ID: [name] [name], female    DOB: 1940/04/17    MRN: [id number]  Brief patient profile:  9 yowf quit smoking  04/2011 dx'd with stage III COPD in 2004.  HPI 07/18/2011 f/u ov/Wert cc w/c bound due to weakness and 02 3lpm 24 hours per day. No cough, confused with meds/ names (only used saba so far on day of ov, not yet gotten around to symbicort) but seems to be getting along ok at this point.   rec Symbicort Take 2 puffs first thing in am and then another 2 puffs about 12 hours later.  Work on inhaler technique:   .    Only use your albuterol as a rescue medication   05/26/2012 f/u ov/Wert cc Chief Complaint  Patient presents with  . COPD    Breathing is unchanged.   doe x room to room even on 4lpm and self titrating 02 at other times rec No change rx Use med calendar      08/24/2012 f/u ov/Wert re copd/ using med calendar better in terms of maint but not prns Chief Complaint  Patient presents with  . 3 month follow up    increased SOB all the time - worsened over the past 2 wks.  Also has wheezing, chest tightness/chest pain, and prod cough with white mucus.    no def change in pain or cough, just the breathing, was fine on 6/27, much worse since assoc with hoarseness and ? Overt HB  >PPI and pepcid   09/07/2012 Follow up and Med review  Returns for follow up and med review .  - pt brought all meds with her today.  We reviewed all her medications and organized them into a medication calendar with patient education. Patient continues to complain of difficulty using her Symbicort inhaler. We discussed switching Symbicort over to, nebulized medications. Patient also had difficulty with Pepcid and has responded to switch over to Zantac with improvement. Patient denies any hemoptysis, orthopnea, PND, or leg swelling. Rec Follow med calendar closely and bring to each visit.  Switch Symbicort to Budesonide/Brovana Neb Twice daily    12/14/2012 f/u ov/Wert re:  copd GOLD III with reversibility/ chronic 02 dep Chief Complaint  Patient presents with  . Follow-up    PFT done today.  Still having SOB    Not following med calendar, no better on "the last neb you gave me" so back on symbicort, Not using 02 as rec Sob x 50 ft on 2lpm (was supposed to be on 3lpm per med calendar >Increase 02 to 4lpm with exertion but ok to use 2lpm at rest and sleeping   01/01/2013 Follow up and Med Review  Hx of COPD GOLD III with reversibility/ chronic 02 dep. Returns for follow up and med review .  Pt brought all meds with her today.  We reviewed all her medications and organized them into a medication calendar with patient education. Having more trouble with portable concentrator not charging.  Feels breathing is doing better on Symbicort.  We discussed pulmonary rehab, she agreed to referral.  No chest pain , orthopnea, edema, abd pain , n/v/d.  >>no changes   05/10/2013 Follow up  Returns for a  4 month follow up COPD -  Reports having increased SOB, occ chest tightness x2 months with prod cough with thick white mucus.  These symptoms wax and wane. Over all feels about the same with no sign changes in her baseline  Discussed  pulmonary rehab referral and she is open to this.  No chest pain, orthopnea, edema or fever.    Current Medications, Allergies, Complete Past Medical History, Past Surgical History, Family History, and Social History were reviewed in [employer] record.  ROS  The following are not active complaints unless bolded sore throat, dysphagia, dental problems, itching, sneezing,  nasal congestion or excess/ purulent secretions, ear ache,   fever, chills, sweats, unintended wt loss, pleuritic or exertional cp, hemoptysis,  orthopnea pnd or leg swelling, presyncope, palpitations, heartburn, abdominal pain, anorexia, nausea, vomiting, diarrhea  or change in bowel or urinary habits, change in stools or urine, dysuria,hematuria,   rash, arthralgias, visual complaints, headache, numbness weakness or ataxia or problems with walking or coordination,  change in mood/affect or memory.         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)  HYPERLIPIDEMIA (ICD-272.4)  HYPERTENSION (ICD-401.9)  CEREBROVASCULAR DISEASE (ICD-437.9)  - 80% R Carotid stenosis - see note by Bradham11/16/11  GLAUCOMA (ICD-365.9)  OBESITY (ICD-278.00)  COPD (ICD-496)  - PFT's 12/10/2002 FEV1 .85(37%) ratio 47 and 11 % better p B2, DLC0 42%  - Walking sats January 25, 2010 > desat even on 2lpm > rec 2.5 lpm 24 h per day  -Med Calendar 01/01/2013       Objective:   Physical Exam    GEN: elderly w/c bound, chronically ill appearing      HEENT mild turbinate edema.  Oropharynx no thrush or excess pnd or cobblestoning.  No JVD or cervical adenopathy. Mild accessory muscle hypertrophy. Trachea midline, nl thryroid. Chest was hyperinflated by percussion with diminished breath sounds and moderate increased exp time without wheeze. Hoover sign positive at mid inspiration. Regular rate and rhythm without murmur gallop or rub or increase P2 or edema.  Abd: no hsm, nl excursion. Ext warm without cyanosis or clubbing.       CXR   12/14/12 No active cardiopulmonary disease. Stable chronic findings            Assessment & Plan:

## 2013-05-10 NOTE — Patient Instructions (Signed)
Continue on current regimen  Refer to pulmonary rehab Follow up in 4  months and As needed

## 2013-05-13 NOTE — Assessment & Plan Note (Signed)
Compensated on present regimen Refer to pulmonary rehab.

## 2013-05-13 NOTE — Assessment & Plan Note (Signed)
Cont on O2 .  

## 2013-06-02 ENCOUNTER — Other Ambulatory Visit: Payer: Self-pay | Admitting: Cardiology

## 2013-06-07 ENCOUNTER — Telehealth (HOSPITAL_COMMUNITY): Payer: Self-pay

## 2013-06-07 ENCOUNTER — Other Ambulatory Visit: Payer: Self-pay | Admitting: Cardiology

## 2013-06-07 NOTE — Telephone Encounter (Signed)
Called patient regarding Pulmonary Rehab.  Patient is interested and is going to call insurance company to verify coverage.

## 2013-06-10 ENCOUNTER — Encounter: Payer: Self-pay | Admitting: Cardiology

## 2013-06-10 ENCOUNTER — Ambulatory Visit (INDEPENDENT_AMBULATORY_CARE_PROVIDER_SITE_OTHER): Payer: Medicare Other | Admitting: Cardiology

## 2013-06-10 VITALS — BP 123/81 | HR 92 | Ht 65.0 in | Wt 176.1 lb

## 2013-06-10 DIAGNOSIS — I714 Abdominal aortic aneurysm, without rupture, unspecified: Secondary | ICD-10-CM

## 2013-06-10 DIAGNOSIS — I472 Ventricular tachycardia: Secondary | ICD-10-CM

## 2013-06-10 DIAGNOSIS — I10 Essential (primary) hypertension: Secondary | ICD-10-CM

## 2013-06-10 DIAGNOSIS — E785 Hyperlipidemia, unspecified: Secondary | ICD-10-CM

## 2013-06-10 DIAGNOSIS — I4891 Unspecified atrial fibrillation: Secondary | ICD-10-CM

## 2013-06-10 DIAGNOSIS — I679 Cerebrovascular disease, unspecified: Secondary | ICD-10-CM

## 2013-06-10 DIAGNOSIS — I4729 Other ventricular tachycardia: Secondary | ICD-10-CM

## 2013-06-10 DIAGNOSIS — I2510 Atherosclerotic heart disease of native coronary artery without angina pectoris: Secondary | ICD-10-CM

## 2013-06-10 DIAGNOSIS — I471 Supraventricular tachycardia: Secondary | ICD-10-CM

## 2013-06-10 DIAGNOSIS — I498 Other specified cardiac arrhythmias: Secondary | ICD-10-CM

## 2013-06-10 LAB — BASIC METABOLIC PANEL
BUN: 10 mg/dL (ref 6–23)
CALCIUM: 9.2 mg/dL (ref 8.4–10.5)
CO2: 33 meq/L — AB (ref 19–32)
CREATININE: 0.8 mg/dL (ref 0.4–1.2)
Chloride: 98 mEq/L (ref 96–112)
GFR: 80.76 mL/min (ref >60.00)
Glucose, Bld: 96 mg/dL (ref 70–99)
Potassium: 3.7 mEq/L (ref 3.5–5.1)
Sodium: 141 mEq/L (ref 135–145)

## 2013-06-10 LAB — HEPATIC FUNCTION PANEL
ALBUMIN: 3.6 g/dL (ref 3.5–5.2)
ALK PHOS: 86 U/L (ref 39–117)
ALT: 30 U/L (ref 0–35)
AST: 33 U/L (ref 0–37)
Bilirubin, Direct: 0.1 mg/dL (ref 0.0–0.3)
Total Bilirubin: 0.7 mg/dL (ref 0.3–1.2)
Total Protein: 7.1 g/dL (ref 6.0–8.3)

## 2013-06-10 LAB — TSH: TSH: 2.12 u[IU]/mL (ref 0.35–5.50)

## 2013-06-10 NOTE — Assessment & Plan Note (Signed)
Continue present blood pressure medications. 

## 2013-06-10 NOTE — Assessment & Plan Note (Signed)
Continue amiodarone. Check TSH, liver functions and chest x-ray. 

## 2013-06-10 NOTE — Assessment & Plan Note (Signed)
Continue amiodarone. 

## 2013-06-10 NOTE — Progress Notes (Signed)
HPI: FU CAD, hypertension, hyperlipidemia, SVT, AAA, COPD, bladder cancer and cerebrovascular disease. She presented to [facility] in early February 2012 for a COPD exacerbation and possible diastolic heart failure. She was noted to have wide complex tachycardia that was felt to be ventricular tachycardia and she was transferred to [facility]. Cardiac catheterization on 04/06/10 demonstrated a totally occluded LAD with right to left collaterals, a nodular density in the prox CFX that occupied about 70% of the prox vessel, and preserved LVF. It was felt that her CAD should be treated medically. She was evaluated by Dr. Lovena Le. It was felt that her ventricular tachycardia would also be treated medically with adjustments in her beta blocker therapy. Ultrasound 12/14 revealed 4.6 x 4.5 cm AAA. The patient has previous attempt at carotid stent on the left. The procedure was unsuccessful due to anatomy; followed by vascular surgery. Repeat echocardiogram in March of 2013 showed normal LV function. There was trace aortic insufficiency and mild left atrial enlargement. Patient also on amiodarone for h/o SVT. I last saw her in Oct 2014. Since then, the patient does have dyspnea on exertion but there is no orthopnea, PND, pedal edema, palpitations, syncope or exertional chest pain.    Current Outpatient Prescriptions  Medication Sig Dispense Refill  . acetaminophen-codeine (TYLENOL #3) 300-30 MG per tablet Take 1 tablet by mouth every 8 (eight) hours as needed.       Marland Kitchen amiodarone (PACERONE) 200 MG tablet Take 200 mg by mouth at bedtime.       Marland Kitchen aspirin EC 81 MG tablet Take 81 mg by mouth at bedtime.      . Aspirin-Acetaminophen-Caffeine (GOODYS EXTRA STRENGTH) [phone] MG PACK As directed      . budesonide-formoterol (SYMBICORT) 160-4.5 MCG/ACT inhaler Inhale 2 puffs into the lungs 2 (two) times daily.  1 Inhaler  11  . clopidogrel (PLAVIX) 75 MG tablet Take 1 tablet (75 mg total) by  mouth at bedtime.      Marland Kitchen diltiazem (CARDIZEM CD) 300 MG 24 hr capsule TAKE 1 CAPSULE IN THE EVENING.  30 capsule  3  . furosemide (LASIX) 20 MG tablet TAKE (1) TABLET BY MOUTH ONCE DAILY AS NEEDED.  30 tablet  6  . ipratropium (ATROVENT) 0.02 % nebulizer solution USE 1 VIAL IN NEBULIZER EVERY 6 HOURS.  300 mL  2  . isosorbide mononitrate (IMDUR) 30 MG 24 hr tablet Take 1 tablet (30 mg total) by mouth at bedtime.  30 tablet  9  . levalbuterol (XOPENEX HFA) 45 MCG/ACT inhaler Inhale 2 puffs into the lungs every 4 (four) hours as needed for wheezing.  8.4 g  0  . nitroGLYCERIN (NITROSTAT) 0.4 MG SL tablet Place 0.4 mg under the tongue every 5 (five) minutes as needed. Chest pain      . pantoprazole (PROTONIX) 40 MG tablet TAKE 1 TABLET 30- 60 MINUTES BEFORE FIRST MEAL OF DAY.  30 tablet  11  . potassium chloride SA (K-DUR,KLOR-CON) 20 MEQ tablet Take 20 mEq by mouth 2 (two) times daily.       . pravastatin (PRAVACHOL) 80 MG tablet Take 80 mg by mouth at bedtime.      . promethazine (PHENERGAN) 25 MG tablet Take 25 mg by mouth every 4 (four) hours as needed. For nausea      . ranitidine (ZANTAC) 150 MG tablet Take 1 tablet (150 mg total) by mouth at bedtime.  30 tablet  2  . travoprost, benzalkonium, (TRAVATAN)  0.004 % ophthalmic solution Place 1 drop into both eyes at bedtime.        No current facility-administered medications for this visit.   Facility-Administered Medications Ordered in Other Visits  Medication Dose Route Frequency Provider Last Rate Last Dose  . mitomycin (MUTAMYCIN) chemo injection 40 mg  40 mg Bladder Instillation Once Fredricka Bonine, MD         Past Medical History  Diagnosis Date  . Hypertension   . Hyperlipidemia   . Cerebrovascular disease, unspecified   . Unspecified glaucoma   . Obesity, unspecified   . Coronary artery disease     cath 04/06/10: LAD occluded with R-L collats; med Rx WTC....probable V. Tach...med Rx  . Ventricular tachycardia   . SVT  (supraventricular tachycardia)   . AAA (abdominal aortic aneurysm)   . COPD (chronic obstructive pulmonary disease) 2013    [place] of breath     WITH ACTIVITY--USES OXYGEN AS NEEDED  2&1/2 L PER NASAL CANNUL  . Headache(784.0)     PAST HX MIGRAINES  . Arthritis   . Complication of anesthesia     PT STATES SHE CAN NOT BE PUT TO SLEEP BECAUSE OF HER LUNG PROBLEMS  . Carotid artery occlusion   . 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 Cystoscopy    . Bladder tumor excision      resection of 5 bladder, and fulguration of 1 small bladder tumor  . Cystoscopy      cold cup bladder biopsy of five tumors, and fulguration of one tumor  . Lung surgery      Remote right lung  . Tubal ligation      Bilateral  . Cystoscopy with biopsy  12/24/2011    Procedure: CYSTOSCOPY WITH BIOPSY;  Surgeon: Fredricka Bonine, MD;  Location: [facility];  Service: Urology;  Laterality: N/A;    History   Social History  . Marital Status: Married    Spouse Name: N/A    Number of Children: 2  . Years of Education: N/A   Occupational History  . Retired     Social History Main Topics  . Smoking status: Former Smoker -- 0.30 packs/day for 58 years    Types: Cigarettes    Quit date: 05/18/2011  . Smokeless tobacco: Never Used  . Alcohol Use: No  . Drug Use: No  . Sexual Activity: No   Other Topics Concern  . Not on file   Social History Narrative   Lives in [place], [place] with husband.     ROS: no fevers or chills, productive cough, hemoptysis, dysphasia, odynophagia, melena, hematochezia, dysuria, hematuria, rash, seizure activity, orthopnea, PND, pedal edema, claudication. Remaining systems are negative.  Physical Exam: Well-developed chronically ill appearing in no acute distress.  Skin is warm and dry.  HEENT is normal.  Neck is supple.  Chest with diminished BS throughout Cardiovascular exam is regular rate and  rhythm.  Abdominal exam nontender or distended. No masses palpated. Extremities show no edema. neuro grossly intact  ECG Sinus Rhythm at a rate of 92. Right bundle branch block. Low voltage.

## 2013-06-10 NOTE — Assessment & Plan Note (Signed)
Continue aspirin and statin. 

## 2013-06-10 NOTE — Assessment & Plan Note (Signed)
Continue statin. 

## 2013-06-10 NOTE — Assessment & Plan Note (Signed)
Most recent abdominal ultrasound showed increasing size of aneurysm. She would be high risk for any procedure given cardiac and pulmonary disease. She is scheduled to see Dr. Trula Slade next week. Question if she would be a candidate for stent graft if aneurysm continues to enlarge. Further imaging will be discussed with him at that time. If he chooses to continue to follow we will plan followup ultrasound in June.

## 2013-06-10 NOTE — Assessment & Plan Note (Signed)
Continue aspirin, Plavix and statin. Followed by vascular surgery.

## 2013-06-10 NOTE — Patient Instructions (Addendum)
Your physician wants you to follow-up in: [place] will receive a reminder letter in the mail two months in advance. If you don't receive a letter, please call our office to schedule the follow-up appointment.   Your physician recommends that you HAVE LAB WORK TODAY  A chest x-ray takes a picture of the organs and structures inside the chest, including the heart, lungs, and blood vessels. This test can show several things, including, whether the heart is enlarges; whether fluid is building up in the lungs; and whether pacemaker / defibrillator leads are still in place.   Your physician has requested that you have an abdominal aorta duplex. During this test, an ultrasound is used to evaluate the aorta. Allow 30 minutes for this exam. Do not eat after midnight the day before and avoid carbonated beverages

## 2013-06-11 ENCOUNTER — Encounter: Payer: Self-pay | Admitting: Surgery

## 2013-06-14 ENCOUNTER — Telehealth (HOSPITAL_COMMUNITY): Payer: Self-pay | Admitting: *Deleted

## 2013-06-14 ENCOUNTER — Encounter: Payer: Self-pay | Admitting: Surgery

## 2013-06-14 ENCOUNTER — Ambulatory Visit (HOSPITAL_COMMUNITY)
Admission: RE | Admit: 2013-06-14 | Discharge: 2013-06-14 | Disposition: A | Discharge status: Home / Self Care | Payer: Medicare Other | Source: Ambulatory Visit | Attending: Surgery | Admitting: Surgery

## 2013-06-14 ENCOUNTER — Ambulatory Visit (INDEPENDENT_AMBULATORY_CARE_PROVIDER_SITE_OTHER): Payer: Medicare Other | Admitting: Surgery

## 2013-06-14 VITALS — BP 145/83 | HR 84 | Ht 65.0 in | Wt 176.0 lb

## 2013-06-14 DIAGNOSIS — I63239 Cerebral infarction due to unspecified occlusion or stenosis of unspecified carotid arteries: Secondary | ICD-10-CM

## 2013-06-14 DIAGNOSIS — I6529 Occlusion and stenosis of unspecified carotid artery: Secondary | ICD-10-CM | POA: Insufficient documentation

## 2013-06-14 NOTE — Progress Notes (Signed)
Patient name: [name] [name] MRN: [id number] DOB: 01-17-1941 Sex: female     Chief Complaint  Patient presents with  . Carotid    6 month f/u     HISTORY OF PRESENT ILLNESS: The patient is back today for followup of her left carotid stenosis. The last time I saw her she was recovering from Bell's palsy. She did have an MRI scan which was negative for infarct when she was diagnosed with Bell's palsy. Her symptoms are intermittent and bothersome to her.. She reports no new neurologic symptoms. I have been following her for progressive greater than 80% left carotid stenosis. She has been deemed a poor candidate for surgery from both cardiac and a pulmonary perspective.  I could not place a carotid stent given her vessel tortuosity.  She is being followed by Dr. Stanford Breed for her aortic aneurysm.  Most recent ultrasound showed a 4.8 cm aneurysm.  This has grown 1 cm over the course of the past year.  She denies abdominal pain or back pain.   Past Medical History  Diagnosis Date  . Hypertension   . Hyperlipidemia   . Cerebrovascular disease, unspecified   . Unspecified glaucoma   . Obesity, unspecified   . Coronary artery disease     cath 04/06/10: LAD occluded with R-L collats; med Rx WTC....probable V. Tach...med Rx  . Ventricular tachycardia   . SVT (supraventricular tachycardia)   . AAA (abdominal aortic aneurysm)   . COPD (chronic obstructive pulmonary disease) 2013    [place] of breath     WITH ACTIVITY--USES OXYGEN AS NEEDED  2&1/2 L PER NASAL CANNUL  . Headache(784.0)     PAST HX MIGRAINES  . Arthritis   . Complication of anesthesia     PT STATES SHE CAN NOT BE PUT TO SLEEP BECAUSE OF HER LUNG PROBLEMS  . Carotid artery occlusion   . Cancer 2009    BLADDER CANCER  . Anemia     Past Surgical History  Procedure Laterality Date  . Cystoscopy    . Bladder tumor excision      resection of 5 bladder, and fulguration of 1 small bladder tumor  . Cystoscopy       cold cup bladder biopsy of five tumors, and fulguration of one tumor  . Lung surgery      Remote right lung  . Tubal ligation      Bilateral  . Cystoscopy with biopsy  12/24/2011    Procedure: CYSTOSCOPY WITH BIOPSY;  Surgeon: Fredricka Bonine, MD;  Location: [facility];  Service: Urology;  Laterality: N/A;    History   Social History  . Marital Status: Married    Spouse Name: N/A    Number of Children: 2  . Years of Education: N/A   Occupational History  . Retired     Social History Main Topics  . Smoking status: Former Smoker -- 0.30 packs/day for 58 years    Types: Cigarettes    Quit date: 05/18/2011  . Smokeless tobacco: Never Used  . Alcohol Use: No  . Drug Use: No  . Sexual Activity: No   Other Topics Concern  . Not on file   Social History Narrative   Lives in [place], [place] with husband.     Family History  Problem Relation Age of Onset  . Heart disease Mother 42  . Heart disease Father   . Hyperlipidemia Father   . Hypertension Father   .  Diabetes Daughter   . Peripheral vascular disease Daughter     Allergies as of 06/14/2013 - Review Complete 06/14/2013  Allergen Reaction Noted  . Albuterol  06/24/2011  . Sulfa antibiotics Nausea Only 04/06/2012    Current Outpatient Prescriptions on File Prior to Visit  Medication Sig Dispense Refill  . acetaminophen-codeine (TYLENOL #3) 300-30 MG per tablet Take 1 tablet by mouth every 8 (eight) hours as needed.       Marland Kitchen amiodarone (PACERONE) 200 MG tablet Take 200 mg by mouth at bedtime.       Marland Kitchen aspirin EC 81 MG tablet Take 81 mg by mouth at bedtime.      . Aspirin-Acetaminophen-Caffeine (GOODYS EXTRA STRENGTH) [phone] MG PACK As directed      . budesonide-formoterol (SYMBICORT) 160-4.5 MCG/ACT inhaler Inhale 2 puffs into the lungs 2 (two) times daily.  1 Inhaler  11  . clopidogrel (PLAVIX) 75 MG tablet Take 1 tablet (75 mg total) by mouth at bedtime.      Marland Kitchen diltiazem (CARDIZEM CD) 300 MG 24 hr  capsule TAKE 1 CAPSULE IN THE EVENING.  30 capsule  3  . furosemide (LASIX) 20 MG tablet TAKE (1) TABLET BY MOUTH ONCE DAILY AS NEEDED.  30 tablet  6  . ipratropium (ATROVENT) 0.02 % nebulizer solution USE 1 VIAL IN NEBULIZER EVERY 6 HOURS.  300 mL  2  . isosorbide mononitrate (IMDUR) 30 MG 24 hr tablet Take 1 tablet (30 mg total) by mouth at bedtime.  30 tablet  9  . levalbuterol (XOPENEX HFA) 45 MCG/ACT inhaler Inhale 2 puffs into the lungs every 4 (four) hours as needed for wheezing.  8.4 g  0  . nitroGLYCERIN (NITROSTAT) 0.4 MG SL tablet Place 0.4 mg under the tongue every 5 (five) minutes as needed. Chest pain      . pantoprazole (PROTONIX) 40 MG tablet TAKE 1 TABLET 30- 60 MINUTES BEFORE FIRST MEAL OF DAY.  30 tablet  11  . potassium chloride SA (K-DUR,KLOR-CON) 20 MEQ tablet Take 20 mEq by mouth 2 (two) times daily.       . pravastatin (PRAVACHOL) 80 MG tablet Take 80 mg by mouth at bedtime.      . promethazine (PHENERGAN) 25 MG tablet Take 25 mg by mouth every 4 (four) hours as needed. For nausea      . ranitidine (ZANTAC) 150 MG tablet Take 1 tablet (150 mg total) by mouth at bedtime.  30 tablet  2  . travoprost, benzalkonium, (TRAVATAN) 0.004 % ophthalmic solution Place 1 drop into both eyes at bedtime.        Current Facility-Administered Medications on File Prior to Visit  Medication Dose Route Frequency Provider Last Rate Last Dose  . mitomycin (MUTAMYCIN) chemo injection 40 mg  40 mg Bladder Instillation Once Fredricka Bonine, MD         REVIEW OF SYSTEMS: CC history of present illness, otherwise all systems are unchanged from visit 6 months ago  PHYSICAL EXAMINATION:   Vital signs are BP 145/83  Pulse 84  Ht 5\' 5"  (1.651 m)  Wt 176 lb (79.833 kg)  BMI 29.29 kg/m2  SpO2 98% General: The patient appears their stated age. HEENT:  No gross abnormalities Pulmonary:  Non labored breathing Abdomen: Soft and non-tender Musculoskeletal: There are no major  deformities. Neurologic: No focal weakness or paresthesias are detected, Skin: There are no ulcer or rashes noted. Psychiatric: The patient has normal affect. Cardiovascular: There is a regular rate and  rhythm without significant murmur appreciated.   Diagnostic Studies Carotid ultrasound was ordered and reviewed.  This shows persistent greater than 80% left carotid stenosis.  Velocity profile is 409/142.  Previously it was 566/207  Assessment: #1: Asymptomatic left carotid stenosis #2: Abdominal aortic aneurysm Plan: #1: The patient remains asymptomatic.  Multiple people have felt that her Bell's palsy which affects the right side of her face is not related to a stroke.  She had a negative MRI in the hospital.  Because she is asymptomatic and a poor candidate for intervention, continue to monitor her.  I would consider operation if she were to develop TIA-like symptoms.  #2: The patient is scheduled for repeat ultrasound in 6 months.  I would not recommend operation to correct her aneurysm until he gets to be greater than 5/5.5 cm.  At some point, she will need a CT scan to determine whether or not she is a candidate for endovascular repair.  Clearly she is not a candidate for open repair  V. Leia Alf, M.D. [facility] Office: [phone] Pager:  [phone]

## 2013-06-14 NOTE — Addendum Note (Signed)
Addended by: Mena Goes on: 06/14/2013 06:37 PM   Modules accepted: Orders

## 2013-06-21 ENCOUNTER — Encounter (HOSPITAL_COMMUNITY): Payer: Medicare Other

## 2013-07-09 ENCOUNTER — Encounter (HOSPITAL_COMMUNITY)
Admission: RE | Admit: 2013-07-09 | Discharge: 2013-07-09 | Disposition: A | Discharge status: Home / Self Care | Payer: Medicare Other | Source: Ambulatory Visit | Attending: Internal Medicine | Admitting: Internal Medicine

## 2013-07-09 ENCOUNTER — Encounter (HOSPITAL_COMMUNITY): Payer: Self-pay

## 2013-07-09 VITALS — BP 100/78 | HR 83 | Ht 65.0 in | Wt 178.0 lb

## 2013-07-09 DIAGNOSIS — J449 Chronic obstructive pulmonary disease, unspecified: Secondary | ICD-10-CM

## 2013-07-09 NOTE — Patient Instructions (Signed)
Pt has finished orientation and is scheduled to start PR on 07/26/13 at 1 pm. Pt has been instructed to arrive to class 15 minutes early for scheduled class. Pt has been instructed to wear comfortable clothing and shoes with rubber soles. Pt has been told to take their medications 1 hour prior to coming to class.  If the patient is not going to attend class, he/she has been instructed to call.

## 2013-07-09 NOTE — Progress Notes (Signed)
Patient was referred to PR by Dr. Melvyn Novas due to COPD 8. During orientation advised patient on arrival and appointment times what to wear, what to do before, during and after exercise. Reviewed attendance and class policy. Talked about inclement weather and class consultation policy. Pt is scheduled to start Pulm Rehab on 07/26/13 at 1 pm. Pt was advised to come to class 5 minutes before class starts. He was also given instructions on meeting with the dietician and attending the Family Structure classes. Pt is eager to get started. Patient was not able to do six minute walk test due to weakness and extreme SOB.

## 2013-07-09 NOTE — Progress Notes (Addendum)
[facility] Pulmonary Rehabilitation Baseline Outcomes Assessment   Anthropometrics:    Height (inches): 65 inches   Weight (kg): 80.7kg   Grip strength was measured using a Dynamometer.  The patient's highest score was a 25.  Functional Status/Exercise Capacity:   [name] [name] had a resting heart [name] of 83 BPM, a resting blood pressure of 100/78, and an oxygen saturation of 100 % on 3 liters of O2.  [name] [name] performed a partial walk test on 07/09/13.  The patient completed 58 feet in 1 minute with 1 rest break..  This quantifies 1.0 METS.   Dyspnea Measures:   The [name] [name] is a simple and standardized method of classifying disability in patients with [name].  The assessment correlates disability and dyspnea.  At entrance the patient scored a 4. The scale is provided below.   0= I only get breathless with strenuous exercise. 1= I get short of breath when hurrying on level ground or walking up a slight incline. 2= On level ground, I walk slower than people of the same age because of breathlessness, or have to stop for breath when walking at my own pace. 3= I stop for breath after walking 100 yards or after a few minutes on level ground. 4=I am too breathless to leave the house or I am breathless when dressing.     The patient completed the [name] [name] ([name] [name]).  This questionnaire relates activities of daily living and shortness of breath.  The score ranges from 0-120, a higher score relates to severe shortness of breath during activities of daily living. The patient's score at entrance was 90.  Quality of Life:   [name] [name] is used to assess the patients satisfaction in different domains of their life; health and functioning, socioeconomic, psychological/spiritual, and family. The overall score is recorded out of 30 points.  The patient's goal is to achieve an overall score of 21 or higher.  [name] [name]  received a 20.76 at entrance.    The Patient Health Questionnaire ([name] [name]) is a first step approach for the screening of depression.  If the patient scores positive on the [name] [name] the patient should be further assessed with the [name] [name].  The Patient Health Questionnaire ([name] [name]) assesses the degree of depression.  Depression is important to monitor and track in pulmonary patients due to its prevalence in the population.  If the patient advances to the [name] [name] the goal is to score less than 4 on this assessment.  [name] [name] scored a 0 at entrance.  Clinical Assessment Tools:   The [name] Assessment Test (CAT) is a measurement tool to quantify how much of an impact the disease has on the patient's life.  This assessment aids the Pulmonary Rehab Team in designing the patients individualized treatment plan.  A CAT score ranges from 0-40.  A score of 10 or below indicates that [name] has a low impact on the patient's life whereas a score of 30 or higher indicates a severe impact. The patient's goal is a decrease of 1 point from entrance to discharge.  [name] [name] had a CAT score of 26 at entrance.  Nutrition:   The "[name] [name]" is a dietary assessment that quantifies the balance of a patient's diet.  This tool allows the Pulmonary Rehab Team to key in on the areas of the patient's diet that needs improving.  The team can then focus their nutritional education on those areas.  If  the patient scores 24-40, this means there are many ways they can make their eating habits healthier, 41-57 states that there are some ways they can make their eating habits healthier and a score of 58-72 states that they are making many healthy choices.  The patient's goal is to achieve a score of 49 or higher on this assessment.  [name] [name] scored a 10 at entrance.  Oxygen Compliance:   Patient is currently on 3-4 liters at rest, 3 liters at night, and 4 liters for exercise.  [name] [name] is currently not using a cpap/bipap at night.  The patient is currently  compliant. The patient states that they do not have barriers that keep them from using their oxygen.  These barriers include none.   Education:   [name] [name] will attend education classes during the course of Pulmonary Rehab.  Education classes that will be offered to the patient are Activities of Daily Living and Energy Conservation, Pursed Lip Breathing and Diaphragmatic Breathing, Nutrition, Exercise for the Pulmonary Patient, Warning Signs of Infection, Chronic Lung Disease, Advanced Directives, Medications, and Stress and Meditation.  The patient completed an assessment at the entrance of the program and will complete it again upon discharge to demonstrate the level of understanding provided by the educational classes.  This assessment includes 14 questions regarding all of the education topics above.  [name] [name] achieved a score of 10/14 at entrance.  Smoking Cessation:  Patient quit smoking in 2012. After smoking for 50+ years at 3ppd.  Exercise:  [name] [name] will be provided with an individualized Home Exercise Prescription (HEP) at the entrance of the program.  The patient will be followed by the Pulmonary Exercise Physiologist throughout the program to assist with the progression of the frequency, intensity, time, and type of exercise. The patient's long-term goal is to be exercising 30-60 minutes, 3-5 days per week. At entrance, the patient was exercising 0 days at home.      Dr. Kate Sable ______________________ Medical Director  Date: ______________________

## 2013-07-12 ENCOUNTER — Ambulatory Visit (HOSPITAL_COMMUNITY): Payer: Medicare Other

## 2013-07-26 ENCOUNTER — Encounter (HOSPITAL_COMMUNITY): Payer: Medicare Other

## 2013-07-28 ENCOUNTER — Encounter (HOSPITAL_COMMUNITY): Payer: Medicare Other

## 2013-08-02 ENCOUNTER — Encounter (HOSPITAL_COMMUNITY): Payer: Medicare Other

## 2013-08-04 ENCOUNTER — Encounter (HOSPITAL_COMMUNITY): Payer: Medicare Other

## 2013-08-09 ENCOUNTER — Encounter (HOSPITAL_COMMUNITY): Payer: Medicare Other

## 2013-08-09 ENCOUNTER — Ambulatory Visit (HOSPITAL_COMMUNITY): Payer: Medicare Other | Attending: Cardiology | Admitting: Cardiology

## 2013-08-09 DIAGNOSIS — I2510 Atherosclerotic heart disease of native coronary artery without angina pectoris: Secondary | ICD-10-CM | POA: Insufficient documentation

## 2013-08-09 DIAGNOSIS — I714 Abdominal aortic aneurysm, without rupture, unspecified: Secondary | ICD-10-CM

## 2013-08-09 DIAGNOSIS — I10 Essential (primary) hypertension: Secondary | ICD-10-CM | POA: Insufficient documentation

## 2013-08-09 DIAGNOSIS — J4489 Other specified chronic obstructive pulmonary disease: Secondary | ICD-10-CM | POA: Insufficient documentation

## 2013-08-09 DIAGNOSIS — E785 Hyperlipidemia, unspecified: Secondary | ICD-10-CM | POA: Insufficient documentation

## 2013-08-09 DIAGNOSIS — Z87891 Personal history of nicotine dependence: Secondary | ICD-10-CM | POA: Insufficient documentation

## 2013-08-09 DIAGNOSIS — J449 Chronic obstructive pulmonary disease, unspecified: Secondary | ICD-10-CM | POA: Insufficient documentation

## 2013-08-09 NOTE — Progress Notes (Signed)
Abdominal aorta duplex completed.  

## 2013-08-11 ENCOUNTER — Encounter (HOSPITAL_COMMUNITY): Payer: Medicare Other

## 2013-08-11 ENCOUNTER — Other Ambulatory Visit: Payer: Self-pay | Admitting: Cardiology

## 2013-08-12 ENCOUNTER — Encounter: Payer: Self-pay | Admitting: Internal Medicine

## 2013-08-12 ENCOUNTER — Telehealth: Payer: Self-pay | Admitting: Internal Medicine

## 2013-08-12 NOTE — Telephone Encounter (Signed)
I called spoke with [name] [name].  MW sent her to do pulm rehab. Pt came once and was not able to do barely anything.  She was only able to stay on nustep (seated stepper). Pt not able to walk on treadmill. They expected her to get SOB. Pt was not willing to stay and try any longer. She called her ride over to pick her up. Pt has not been back since 08/02/13. [name] [name] called spoke with pt. She reports the reason she is not going back was bc [name] [name] made a comment to her "she was wasting her time". Per [name] [name] she never stated this to pt and pt stated to [name] [name] "then what did you say".  She just wanted to make [place] aware.

## 2013-08-16 ENCOUNTER — Encounter (HOSPITAL_COMMUNITY): Payer: Medicare Other

## 2013-08-17 NOTE — Progress Notes (Signed)
[name] [name] 73 y.o. female  Initial Psychosocial Assessment  Pt psychosocial assessment reveals pt lives with their spouse. Pt is currently retired. Pt hobbies include gambling, watching TV, reading, crossword puzzles. Pt reports her stress level is 2 low.  Areas of stress/anxiety include inability to breath, inability to drive and do things for herself.  Pt does exhibits signs of depression. Signs of depression include irritability and agitation. Pt shows fair  coping skills with negative outlook . Offered emotional support and reassurance. Monitor and evaluate progress toward psychosocial goal(s).  Goal(s): Improved management of breathing stress, depression, and anxiety surrounding becoming more independent.  Improved coping skills Help patient work toward returning to meaningful activities that improve patient's QOL and are attainable with patient's lung disease.    Dr. Jamesetta So Koneswaran___________________________          Date____________________ Medical Director    08/17/2013 8:51 AM

## 2013-08-17 NOTE — Addendum Note (Signed)
Encounter addended by: Gean Maidens on: 08/17/2013 10:39 AM<BR>     Documentation filed: Notes Section

## 2013-08-18 ENCOUNTER — Encounter (HOSPITAL_COMMUNITY): Payer: Medicare Other

## 2013-08-23 ENCOUNTER — Encounter (HOSPITAL_COMMUNITY): Payer: Medicare Other

## 2013-08-25 ENCOUNTER — Encounter (HOSPITAL_COMMUNITY): Payer: Medicare Other

## 2013-08-30 ENCOUNTER — Encounter (HOSPITAL_COMMUNITY): Payer: Medicare Other

## 2013-09-01 ENCOUNTER — Encounter (HOSPITAL_COMMUNITY): Payer: Medicare Other

## 2013-09-02 ENCOUNTER — Other Ambulatory Visit: Payer: Self-pay | Admitting: Cardiology

## 2013-09-06 ENCOUNTER — Encounter (HOSPITAL_COMMUNITY): Payer: Medicare Other

## 2013-09-07 ENCOUNTER — Encounter: Payer: Self-pay | Admitting: Adult Health

## 2013-09-07 ENCOUNTER — Ambulatory Visit (INDEPENDENT_AMBULATORY_CARE_PROVIDER_SITE_OTHER): Payer: Medicare Other | Admitting: Adult Health

## 2013-09-07 ENCOUNTER — Ambulatory Visit (INDEPENDENT_AMBULATORY_CARE_PROVIDER_SITE_OTHER)
Admission: RE | Admit: 2013-09-07 | Discharge: 2013-09-07 | Disposition: A | Discharge status: Home / Self Care | Payer: Medicare Other | Source: Ambulatory Visit | Attending: Cardiology | Admitting: Cardiology

## 2013-09-07 VITALS — BP 110/82 | HR 88 | Temp 98.1°F | Ht 65.0 in | Wt 176.4 lb

## 2013-09-07 DIAGNOSIS — I2510 Atherosclerotic heart disease of native coronary artery without angina pectoris: Secondary | ICD-10-CM

## 2013-09-07 DIAGNOSIS — I4891 Unspecified atrial fibrillation: Secondary | ICD-10-CM

## 2013-09-07 DIAGNOSIS — J441 Chronic obstructive pulmonary disease with (acute) exacerbation: Secondary | ICD-10-CM

## 2013-09-07 MED ORDER — PREDNISONE 10 MG PO TABS: ORAL_TABLET | ORAL | Status: DC

## 2013-09-07 MED ORDER — BUDESONIDE-FORMOTEROL FUMARATE 160-4.5 MCG/ACT IN AERO: 2.0000 | INHALATION_SPRAY | Freq: Two times a day (BID) | RESPIRATORY_TRACT | Status: DC

## 2013-09-07 MED ORDER — LEVALBUTEROL TARTRATE 45 MCG/ACT IN AERO: 2.0000 | INHALATION_SPRAY | RESPIRATORY_TRACT | Status: AC | PRN

## 2013-09-07 MED ORDER — DOXYCYCLINE HYCLATE 100 MG PO TABS: 100.0000 mg | ORAL_TABLET | Freq: Two times a day (BID) | ORAL | Status: DC

## 2013-09-07 NOTE — Assessment & Plan Note (Signed)
Flare   Plan  Doxycycline 100mg  Twice daily  For 7 days  Prednisone taper over next week.  Mucinex DM Twice daily  As needed  Cough/congestion  Chest xray today  Please contact office for sooner follow up if symptoms do not improve or worsen or seek emergency care  Follow up Dr. Melvyn Novas  In 3-4 months and As needed

## 2013-09-07 NOTE — Progress Notes (Signed)
Subjective:    Patient ID: [name] [name], female    DOB: 11/28/1940    MRN: [id number]  Brief patient profile:  20 yowf quit smoking  04/2011 dx'd with stage III COPD in 2004.  HPI 07/18/2011 f/u ov/Wert cc w/c bound due to weakness and 02 3lpm 24 hours per day. No cough, confused with meds/ names (only used saba so far on day of ov, not yet gotten around to symbicort) but seems to be getting along ok at this point.   rec Symbicort Take 2 puffs first thing in am and then another 2 puffs about 12 hours later.  Work on inhaler technique:   .    Only use your albuterol as a rescue medication   05/26/2012 f/u ov/Wert cc Chief Complaint  Patient presents with  . COPD    Breathing is unchanged.   doe x room to room even on 4lpm and self titrating 02 at other times rec No change rx Use med calendar      08/24/2012 f/u ov/Wert re copd/ using med calendar better in terms of maint but not prns Chief Complaint  Patient presents with  . 3 month follow up    increased SOB all the time - worsened over the past 2 wks.  Also has wheezing, chest tightness/chest pain, and prod cough with white mucus.    no def change in pain or cough, just the breathing, was fine on 6/27, much worse since assoc with hoarseness and ? Overt HB  >PPI and pepcid   09/07/2012 Follow up and Med review  Returns for follow up and med review .  - pt brought all meds with her today.  We reviewed all her medications and organized them into a medication calendar with patient education. Patient continues to complain of difficulty using her Symbicort inhaler. We discussed switching Symbicort over to, nebulized medications. Patient also had difficulty with Pepcid and has responded to switch over to Zantac with improvement. Patient denies any hemoptysis, orthopnea, PND, or leg swelling. Rec Follow med calendar closely and bring to each visit.  Switch Symbicort to Budesonide/Brovana Neb Twice daily    12/14/2012 f/u ov/Wert re:  copd GOLD III with reversibility/ chronic 02 dep Chief Complaint  Patient presents with  . Follow-up    PFT done today.  Still having SOB    Not following med calendar, no better on "the last neb you gave me" so back on symbicort, Not using 02 as rec Sob x 50 ft on 2lpm (was supposed to be on 3lpm per med calendar >Increase 02 to 4lpm with exertion but ok to use 2lpm at rest and sleeping   01/01/2013 Follow up and Med Review  Hx of COPD GOLD III with reversibility/ chronic 02 dep. Returns for follow up and med review .  Pt brought all meds with her today.  We reviewed all her medications and organized them into a medication calendar with patient education. Having more trouble with portable concentrator not charging.  Feels breathing is doing better on Symbicort.  We discussed pulmonary rehab, she agreed to referral.  No chest pain , orthopnea, edema, abd pain , n/v/d.  >>no changes   05/10/2013 Follow up  Returns for a  4 month follow up COPD -  Reports having increased SOB, occ chest tightness x2 months with prod cough with thick white mucus.  These symptoms wax and wane. Over all feels about the same with no sign changes in her baseline  Discussed  pulmonary rehab referral and she is open to this.  No chest pain, orthopnea, edema or fever.  >>no changes   09/07/2013 Follow up COPD  MW pt here for 4 month COPD follow up.  Reports increased SOB x2-5months with wheezing, tightness, prod cough with white mucus, nausea.   Worse for last 1 week.  Denies f/c/s/v/d, hemoptysis, PND, leg swelling. On Symbicort and Atrovent Neb Four times a day   On O2 at 4l/m .    Current Medications, Allergies, Complete Past Medical History, Past Surgical History, Family History, and Social History were reviewed in [employer] record.  ROS  The following are not active complaints unless bolded sore throat, dysphagia, dental problems, itching, sneezing,  nasal congestion or  excess/ purulent secretions, ear ache,   fever, chills, sweats, unintended wt loss, pleuritic or exertional cp, hemoptysis,  orthopnea pnd or leg swelling, presyncope, palpitations, heartburn, abdominal pain, anorexia, nausea, vomiting, diarrhea  or change in bowel or urinary habits, change in stools or urine, dysuria,hematuria,  rash, arthralgias, visual complaints, headache, numbness weakness or ataxia or problems with walking or coordination,  change in mood/affect or memory.         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)  HYPERLIPIDEMIA (ICD-272.4)  HYPERTENSION (ICD-401.9)  CEREBROVASCULAR DISEASE (ICD-437.9)  - 80% R Carotid stenosis - see note by Bradham11/16/11  GLAUCOMA (ICD-365.9)  OBESITY (ICD-278.00)  COPD (ICD-496)  - PFT's 12/10/2002 FEV1 .85(37%) ratio 47 and 11 % better p B2, DLC0 42%  - Walking sats January 25, 2010 > desat even on 2lpm > rec 2.5 lpm 24 h per day  -Med Calendar 01/01/2013       Objective:   Physical Exam    GEN: elderly w/c bound, chronically ill appearing    HEENT mild turbinate edema.  Oropharynx no thrush or excess pnd or cobblestoning.  No JVD or cervical adenopathy. Mild accessory muscle hypertrophy. Trachea midline, nl thryroid. Chest was hyperinflated by percussion with diminished breath sounds and moderate increased exp time without wheeze. Hoover sign positive at mid inspiration. Regular rate and rhythm without murmur gallop or rub or increase P2 or edema.  Abd: no hsm, nl excursion. Ext warm without cyanosis or clubbing.       CXR   12/14/12 No active cardiopulmonary disease. Stable chronic findings            Assessment & Plan:

## 2013-09-07 NOTE — Patient Instructions (Addendum)
Doxycycline 100mg  Twice daily  For 7 days  Prednisone taper over next week.  Mucinex DM Twice daily  As needed  Cough/congestion  Chest xray today  Please contact office for sooner follow up if symptoms do not improve or worsen or seek emergency care  Follow up Dr. Melvyn Novas  In 3-4 months and As needed

## 2013-09-08 ENCOUNTER — Encounter (HOSPITAL_COMMUNITY): Payer: Medicare Other

## 2013-09-10 ENCOUNTER — Ambulatory Visit: Payer: Medicare Other | Admitting: Adult Health

## 2013-09-13 ENCOUNTER — Encounter (HOSPITAL_COMMUNITY): Payer: Medicare Other

## 2013-09-15 ENCOUNTER — Encounter (HOSPITAL_COMMUNITY): Payer: Medicare Other

## 2013-09-20 ENCOUNTER — Encounter (HOSPITAL_COMMUNITY): Payer: Medicare Other

## 2013-09-22 ENCOUNTER — Encounter (HOSPITAL_COMMUNITY): Payer: Medicare Other

## 2013-09-27 ENCOUNTER — Encounter (HOSPITAL_COMMUNITY): Payer: Medicare Other

## 2013-09-27 ENCOUNTER — Other Ambulatory Visit: Payer: Self-pay | Admitting: Cardiology

## 2013-09-29 ENCOUNTER — Encounter (HOSPITAL_COMMUNITY): Payer: Medicare Other

## 2013-10-04 ENCOUNTER — Encounter (HOSPITAL_COMMUNITY): Payer: Medicare Other

## 2013-10-06 ENCOUNTER — Encounter (HOSPITAL_COMMUNITY): Payer: Medicare Other

## 2013-10-11 ENCOUNTER — Encounter (HOSPITAL_COMMUNITY): Payer: Medicare Other

## 2013-10-12 ENCOUNTER — Other Ambulatory Visit: Payer: Self-pay | Admitting: Internal Medicine

## 2013-10-13 ENCOUNTER — Encounter (HOSPITAL_COMMUNITY): Payer: Medicare Other

## 2013-10-20 ENCOUNTER — Emergency Department (HOSPITAL_COMMUNITY): Payer: Medicare Other

## 2013-10-20 ENCOUNTER — Encounter (HOSPITAL_COMMUNITY): Payer: Self-pay | Admitting: Emergency Medicine

## 2013-10-20 ENCOUNTER — Inpatient Hospital Stay (HOSPITAL_COMMUNITY)
Admission: EM | Admit: 2013-10-20 | Discharge: 2013-10-22 | DRG: 190 | Disposition: A | Discharge status: Home / Self Care | Payer: Medicare Other | Attending: Internal Medicine | Admitting: Internal Medicine

## 2013-10-20 DIAGNOSIS — I679 Cerebrovascular disease, unspecified: Secondary | ICD-10-CM | POA: Diagnosis present

## 2013-10-20 DIAGNOSIS — I714 Abdominal aortic aneurysm, without rupture, unspecified: Secondary | ICD-10-CM

## 2013-10-20 DIAGNOSIS — Z79899 Other long term (current) drug therapy: Secondary | ICD-10-CM

## 2013-10-20 DIAGNOSIS — I4510 Unspecified right bundle-branch block: Secondary | ICD-10-CM | POA: Diagnosis present

## 2013-10-20 DIAGNOSIS — E872 Acidosis, unspecified: Secondary | ICD-10-CM | POA: Diagnosis present

## 2013-10-20 DIAGNOSIS — Z8679 Personal history of other diseases of the circulatory system: Secondary | ICD-10-CM

## 2013-10-20 DIAGNOSIS — F17200 Nicotine dependence, unspecified, uncomplicated: Secondary | ICD-10-CM

## 2013-10-20 DIAGNOSIS — Z881 Allergy status to other antibiotic agents status: Secondary | ICD-10-CM

## 2013-10-20 DIAGNOSIS — Z8551 Personal history of malignant neoplasm of bladder: Secondary | ICD-10-CM | POA: Diagnosis not present

## 2013-10-20 DIAGNOSIS — J9620 Acute and chronic respiratory failure, unspecified whether with hypoxia or hypercapnia: Secondary | ICD-10-CM | POA: Diagnosis present

## 2013-10-20 DIAGNOSIS — Z882 Allergy status to sulfonamides status: Secondary | ICD-10-CM | POA: Diagnosis not present

## 2013-10-20 DIAGNOSIS — I2510 Atherosclerotic heart disease of native coronary artery without angina pectoris: Secondary | ICD-10-CM | POA: Diagnosis present

## 2013-10-20 DIAGNOSIS — R0602 Shortness of breath: Secondary | ICD-10-CM | POA: Diagnosis present

## 2013-10-20 DIAGNOSIS — J9622 Acute and chronic respiratory failure with hypercapnia: Secondary | ICD-10-CM

## 2013-10-20 DIAGNOSIS — I10 Essential (primary) hypertension: Secondary | ICD-10-CM | POA: Diagnosis present

## 2013-10-20 DIAGNOSIS — H409 Unspecified glaucoma: Secondary | ICD-10-CM | POA: Diagnosis present

## 2013-10-20 DIAGNOSIS — Z9981 Dependence on supplemental oxygen: Secondary | ICD-10-CM | POA: Diagnosis not present

## 2013-10-20 DIAGNOSIS — R202 Paresthesia of skin: Secondary | ICD-10-CM

## 2013-10-20 DIAGNOSIS — R200 Anesthesia of skin: Secondary | ICD-10-CM

## 2013-10-20 DIAGNOSIS — F419 Anxiety disorder, unspecified: Secondary | ICD-10-CM | POA: Diagnosis present

## 2013-10-20 DIAGNOSIS — Z888 Allergy status to other drugs, medicaments and biological substances status: Secondary | ICD-10-CM

## 2013-10-20 DIAGNOSIS — J441 Chronic obstructive pulmonary disease with (acute) exacerbation: Principal | ICD-10-CM | POA: Diagnosis present

## 2013-10-20 DIAGNOSIS — Z8249 Family history of ischemic heart disease and other diseases of the circulatory system: Secondary | ICD-10-CM

## 2013-10-20 DIAGNOSIS — Z9189 Other specified personal risk factors, not elsewhere classified: Secondary | ICD-10-CM

## 2013-10-20 DIAGNOSIS — I472 Ventricular tachycardia, unspecified: Secondary | ICD-10-CM | POA: Diagnosis present

## 2013-10-20 DIAGNOSIS — I4729 Other ventricular tachycardia: Secondary | ICD-10-CM

## 2013-10-20 DIAGNOSIS — Z7982 Long term (current) use of aspirin: Secondary | ICD-10-CM

## 2013-10-20 DIAGNOSIS — R5381 Other malaise: Secondary | ICD-10-CM

## 2013-10-20 DIAGNOSIS — Z7902 Long term (current) use of antithrombotics/antiplatelets: Secondary | ICD-10-CM

## 2013-10-20 DIAGNOSIS — F411 Generalized anxiety disorder: Secondary | ICD-10-CM | POA: Diagnosis present

## 2013-10-20 DIAGNOSIS — Z833 Family history of diabetes mellitus: Secondary | ICD-10-CM | POA: Diagnosis not present

## 2013-10-20 DIAGNOSIS — I498 Other specified cardiac arrhythmias: Secondary | ICD-10-CM

## 2013-10-20 DIAGNOSIS — I63239 Cerebral infarction due to unspecified occlusion or stenosis of unspecified carotid arteries: Secondary | ICD-10-CM

## 2013-10-20 DIAGNOSIS — E785 Hyperlipidemia, unspecified: Secondary | ICD-10-CM | POA: Diagnosis present

## 2013-10-20 DIAGNOSIS — R0600 Dyspnea, unspecified: Secondary | ICD-10-CM

## 2013-10-20 DIAGNOSIS — J449 Chronic obstructive pulmonary disease, unspecified: Secondary | ICD-10-CM | POA: Diagnosis present

## 2013-10-20 DIAGNOSIS — M129 Arthropathy, unspecified: Secondary | ICD-10-CM | POA: Diagnosis present

## 2013-10-20 DIAGNOSIS — Z87891 Personal history of nicotine dependence: Secondary | ICD-10-CM

## 2013-10-20 DIAGNOSIS — I471 Supraventricular tachycardia: Secondary | ICD-10-CM

## 2013-10-20 DIAGNOSIS — R8290 Unspecified abnormal findings in urine: Secondary | ICD-10-CM

## 2013-10-20 DIAGNOSIS — I6529 Occlusion and stenosis of unspecified carotid artery: Secondary | ICD-10-CM

## 2013-10-20 HISTORY — DX: Gastro-esophageal reflux disease without esophagitis: K21.9

## 2013-10-20 HISTORY — DX: Abdominal aortic aneurysm, without rupture, unspecified: I71.40

## 2013-10-20 HISTORY — DX: Pneumonia, unspecified organism: J18.9

## 2013-10-20 HISTORY — DX: Abdominal aortic aneurysm, without rupture: I71.4

## 2013-10-20 HISTORY — DX: Malignant neoplasm of bladder, unspecified: C67.9

## 2013-10-20 HISTORY — DX: Heart failure, unspecified: I50.9

## 2013-10-20 HISTORY — DX: Dependence on supplemental oxygen: Z99.81

## 2013-10-20 HISTORY — DX: Migraine, unspecified, not intractable, without status migrainosus: G43.909

## 2013-10-20 LAB — COMPREHENSIVE METABOLIC PANEL
ALBUMIN: 3.5 g/dL (ref 3.5–5.2)
ALT: 30 U/L (ref 0–35)
ANION GAP: 15 (ref 5–15)
AST: 43 U/L — ABNORMAL HIGH (ref 0–37)
Alkaline Phosphatase: 91 U/L (ref 39–117)
BILIRUBIN TOTAL: 0.4 mg/dL (ref 0.3–1.2)
BUN: 10 mg/dL (ref 6–23)
CHLORIDE: 96 meq/L (ref 96–112)
CO2: 31 mEq/L (ref 19–32)
Calcium: 9.4 mg/dL (ref 8.4–10.5)
Creatinine, Ser: 0.66 mg/dL (ref 0.50–1.10)
GFR calc Af Amer: >90 mL/min (ref >90)
GFR calc non Af Amer: 86 mL/min — ABNORMAL LOW (ref >90)
Glucose, Bld: 134 mg/dL — ABNORMAL HIGH (ref 70–99)
POTASSIUM: 3.6 meq/L — AB (ref 3.7–5.3)
SODIUM: 142 meq/L (ref 137–147)
Total Protein: 7.3 g/dL (ref 6.0–8.3)

## 2013-10-20 LAB — I-STAT VENOUS BLOOD GAS, ED
Acid-Base Excess: 9 mmol/L — ABNORMAL HIGH (ref 0.0–2.0)
Bicarbonate: 38 mEq/L — ABNORMAL HIGH (ref 20.0–24.0)
O2 Saturation: 46 %
PH VEN: 7.329 — AB (ref 7.250–7.300)
PO2 VEN: 28 mmHg — AB (ref 30.0–45.0)
TCO2: 40 mmol/L (ref 0–100)
pCO2, Ven: 72.1 mmHg (ref 45.0–50.0)

## 2013-10-20 LAB — TROPONIN I: Troponin I: <0.3 ng/mL (ref <0.30)

## 2013-10-20 LAB — CBC WITH DIFFERENTIAL/PLATELET
BASOS ABS: 0 10*3/uL (ref 0.0–0.1)
Basophils Relative: 0 % (ref 0–1)
EOS ABS: 0.1 10*3/uL (ref 0.0–0.7)
Eosinophils Relative: 0 % (ref 0–5)
HCT: 38.9 % (ref 36.0–46.0)
HEMOGLOBIN: 11.7 g/dL — AB (ref 12.0–15.0)
Lymphocytes Relative: 11 % — ABNORMAL LOW (ref 12–46)
Lymphs Abs: 1.3 10*3/uL (ref 0.7–4.0)
MCH: 25.7 pg — ABNORMAL LOW (ref 26.0–34.0)
MCHC: 30.1 g/dL (ref 30.0–36.0)
MCV: 85.5 fL (ref 78.0–100.0)
MONOS PCT: 8 % (ref 3–12)
Monocytes Absolute: 0.9 10*3/uL (ref 0.1–1.0)
NEUTROS ABS: 9.9 10*3/uL — AB (ref 1.7–7.7)
NEUTROS PCT: 81 % — AB (ref 43–77)
Platelets: 279 10*3/uL (ref 150–400)
RBC: 4.55 MIL/uL (ref 3.87–5.11)
RDW: 17.2 % — AB (ref 11.5–15.5)
WBC: 12.2 10*3/uL — ABNORMAL HIGH (ref 4.0–10.5)

## 2013-10-20 LAB — I-STAT TROPONIN, ED: Troponin i, poc: 0 ng/mL (ref 0.00–0.08)

## 2013-10-20 MED ORDER — POTASSIUM CHLORIDE CRYS ER 20 MEQ PO TBCR
20.0000 meq | EXTENDED_RELEASE_TABLET | Freq: Two times a day (BID) | ORAL | Status: DC
  Administered 2013-10-20 – 2013-10-22 (×4): 20 meq via ORAL
  Filled 2013-10-20 (×7): qty 1

## 2013-10-20 MED ORDER — ASPIRIN EC 81 MG PO TBEC
81.0000 mg | DELAYED_RELEASE_TABLET | Freq: Every day | ORAL | Status: DC
  Administered 2013-10-20 – 2013-10-21 (×2): 81 mg via ORAL
  Filled 2013-10-20 (×3): qty 1

## 2013-10-20 MED ORDER — IPRATROPIUM BROMIDE 0.02 % IN SOLN
0.5000 mg | RESPIRATORY_TRACT | Status: DC
  Administered 2013-10-20 – 2013-10-22 (×9): 0.5 mg via RESPIRATORY_TRACT
  Filled 2013-10-20 (×9): qty 2.5

## 2013-10-20 MED ORDER — LATANOPROST 0.005 % OP SOLN
1.0000 [drp] | Freq: Every day | OPHTHALMIC | Status: DC
  Administered 2013-10-21: 1 [drp] via OPHTHALMIC
  Filled 2013-10-20: qty 2.5

## 2013-10-20 MED ORDER — IPRATROPIUM BROMIDE 0.02 % IN SOLN
0.5000 mg | Freq: Once | RESPIRATORY_TRACT | Status: AC
  Administered 2013-10-20: 0.5 mg via RESPIRATORY_TRACT
  Filled 2013-10-20: qty 2.5

## 2013-10-20 MED ORDER — BUDESONIDE-FORMOTEROL FUMARATE 160-4.5 MCG/ACT IN AERO
2.0000 | INHALATION_SPRAY | Freq: Two times a day (BID) | RESPIRATORY_TRACT | Status: DC
  Administered 2013-10-20 – 2013-10-22 (×4): 2 via RESPIRATORY_TRACT
  Filled 2013-10-20: qty 6

## 2013-10-20 MED ORDER — ACETAMINOPHEN 650 MG RE SUPP: 650.0000 mg | Freq: Four times a day (QID) | RECTAL | Status: DC | PRN

## 2013-10-20 MED ORDER — LEVALBUTEROL HCL 0.63 MG/3ML IN NEBU: 0.6300 mg | INHALATION_SOLUTION | RESPIRATORY_TRACT | Status: DC | PRN

## 2013-10-20 MED ORDER — SODIUM CHLORIDE 0.9 % IJ SOLN
3.0000 mL | Freq: Two times a day (BID) | INTRAMUSCULAR | Status: DC
  Administered 2013-10-21 – 2013-10-22 (×2): 3 mL via INTRAVENOUS

## 2013-10-20 MED ORDER — ISOSORBIDE MONONITRATE ER 30 MG PO TB24
30.0000 mg | ORAL_TABLET | Freq: Every day | ORAL | Status: DC
  Administered 2013-10-20 – 2013-10-22 (×3): 30 mg via ORAL
  Filled 2013-10-20 (×3): qty 1

## 2013-10-20 MED ORDER — LEVALBUTEROL HCL 0.63 MG/3ML IN NEBU
0.6300 mg | INHALATION_SOLUTION | RESPIRATORY_TRACT | Status: DC
  Administered 2013-10-20 – 2013-10-22 (×9): 0.63 mg via RESPIRATORY_TRACT
  Filled 2013-10-20 (×21): qty 3

## 2013-10-20 MED ORDER — ONDANSETRON HCL 4 MG/2ML IJ SOLN
4.0000 mg | Freq: Four times a day (QID) | INTRAMUSCULAR | Status: DC | PRN
  Filled 2013-10-20: qty 2

## 2013-10-20 MED ORDER — DEXTROSE 5 % IV SOLN
1.0000 g | INTRAVENOUS | Status: DC
  Administered 2013-10-20: 1 g via INTRAVENOUS
  Filled 2013-10-20 (×2): qty 10

## 2013-10-20 MED ORDER — DILTIAZEM HCL ER COATED BEADS 300 MG PO CP24
300.0000 mg | ORAL_CAPSULE | Freq: Every day | ORAL | Status: DC
  Administered 2013-10-20 – 2013-10-22 (×3): 300 mg via ORAL
  Filled 2013-10-20 (×3): qty 1

## 2013-10-20 MED ORDER — SODIUM CHLORIDE 0.9 % IJ SOLN
3.0000 mL | Freq: Two times a day (BID) | INTRAMUSCULAR | Status: DC
  Administered 2013-10-20 – 2013-10-22 (×4): 3 mL via INTRAVENOUS

## 2013-10-20 MED ORDER — PANTOPRAZOLE SODIUM 40 MG PO TBEC
40.0000 mg | DELAYED_RELEASE_TABLET | Freq: Every day | ORAL | Status: DC
  Administered 2013-10-21 – 2013-10-22 (×2): 40 mg via ORAL
  Filled 2013-10-20 (×2): qty 1

## 2013-10-20 MED ORDER — FUROSEMIDE 20 MG PO TABS
10.0000 mg | ORAL_TABLET | Freq: Two times a day (BID) | ORAL | Status: DC
  Administered 2013-10-20 – 2013-10-22 (×4): 10 mg via ORAL
  Filled 2013-10-20 (×6): qty 0.5

## 2013-10-20 MED ORDER — SODIUM CHLORIDE 0.9 % IV SOLN: 250.0000 mL | INTRAVENOUS | Status: DC | PRN

## 2013-10-20 MED ORDER — ENOXAPARIN SODIUM 40 MG/0.4ML ~~LOC~~ SOLN
40.0000 mg | SUBCUTANEOUS | Status: DC
  Administered 2013-10-20 – 2013-10-21 (×2): 40 mg via SUBCUTANEOUS
  Filled 2013-10-20 (×3): qty 0.4

## 2013-10-20 MED ORDER — CLOPIDOGREL BISULFATE 75 MG PO TABS
75.0000 mg | ORAL_TABLET | Freq: Every day | ORAL | Status: DC
  Administered 2013-10-20 – 2013-10-21 (×2): 75 mg via ORAL
  Filled 2013-10-20 (×3): qty 1

## 2013-10-20 MED ORDER — LEVALBUTEROL HCL 0.63 MG/3ML IN NEBU
0.6300 mg | INHALATION_SOLUTION | Freq: Once | RESPIRATORY_TRACT | Status: AC
  Administered 2013-10-20: 0.63 mg via RESPIRATORY_TRACT
  Filled 2013-10-20: qty 3

## 2013-10-20 MED ORDER — SODIUM CHLORIDE 0.9 % IV BOLUS (SEPSIS)
500.0000 mL | Freq: Once | INTRAVENOUS | Status: AC
  Administered 2013-10-20: 500 mL via INTRAVENOUS

## 2013-10-20 MED ORDER — PREDNISONE 20 MG PO TABS
60.0000 mg | ORAL_TABLET | Freq: Once | ORAL | Status: AC
Start: 2013-10-20 — End: 2013-10-20
  Administered 2013-10-20: 60 mg via ORAL
  Filled 2013-10-20: qty 3

## 2013-10-20 MED ORDER — LEVALBUTEROL HCL 0.63 MG/3ML IN NEBU
0.6300 mg | INHALATION_SOLUTION | Freq: Once | RESPIRATORY_TRACT | Status: AC
  Administered 2013-10-20: 0.63 mg via RESPIRATORY_TRACT
  Filled 2013-10-20 (×2): qty 3

## 2013-10-20 MED ORDER — DOCUSATE SODIUM 100 MG PO CAPS
100.0000 mg | ORAL_CAPSULE | Freq: Two times a day (BID) | ORAL | Status: DC
  Administered 2013-10-21: 100 mg via ORAL
  Filled 2013-10-20 (×5): qty 1

## 2013-10-20 MED ORDER — ACETAMINOPHEN 325 MG PO TABS: 650.0000 mg | ORAL_TABLET | Freq: Four times a day (QID) | ORAL | Status: DC | PRN

## 2013-10-20 MED ORDER — SIMVASTATIN 40 MG PO TABS: 40.0000 mg | ORAL_TABLET | Freq: Every day | ORAL | Status: DC

## 2013-10-20 MED ORDER — ACETAMINOPHEN-CODEINE #3 300-30 MG PO TABS
1.0000 | ORAL_TABLET | Freq: Three times a day (TID) | ORAL | Status: DC | PRN
  Administered 2013-10-20 – 2013-10-22 (×4): 1 via ORAL
  Filled 2013-10-20 (×4): qty 1

## 2013-10-20 MED ORDER — METHYLPREDNISOLONE SODIUM SUCC 125 MG IJ SOLR
60.0000 mg | Freq: Four times a day (QID) | INTRAMUSCULAR | Status: DC
  Administered 2013-10-20 – 2013-10-22 (×7): 60 mg via INTRAVENOUS
  Filled 2013-10-20 (×11): qty 0.96

## 2013-10-20 MED ORDER — SODIUM CHLORIDE 0.9 % IJ SOLN
3.0000 mL | INTRAMUSCULAR | Status: DC | PRN
Start: 2013-10-20 — End: 2013-10-22

## 2013-10-20 MED ORDER — ATORVASTATIN CALCIUM 20 MG PO TABS
20.0000 mg | ORAL_TABLET | Freq: Every day | ORAL | Status: DC
Start: 2013-10-20 — End: 2013-10-22
  Administered 2013-10-20 – 2013-10-21 (×2): 20 mg via ORAL
  Filled 2013-10-20 (×3): qty 1

## 2013-10-20 MED ORDER — ONDANSETRON HCL 4 MG PO TABS: 4.0000 mg | ORAL_TABLET | Freq: Four times a day (QID) | ORAL | Status: DC | PRN

## 2013-10-20 MED ORDER — AMIODARONE HCL 200 MG PO TABS
200.0000 mg | ORAL_TABLET | Freq: Every day | ORAL | Status: DC
  Administered 2013-10-20 – 2013-10-21 (×2): 200 mg via ORAL
  Filled 2013-10-20 (×3): qty 1

## 2013-10-20 NOTE — ED Provider Notes (Signed)
CSN: [id number]     Arrival date & time 10/20/13  1334 History   First MD Initiated Contact with Patient 10/20/13 1343     Chief Complaint  Patient presents with  . Shortness of Breath     (Consider location/radiation/quality/duration/timing/severity/associated sxs/prior Treatment) HPI 73 year old female presents with worsening shortness of breath over the last 2-3 days. She states that she is always somewhat short of breath but is having significantly more short of breath, especially with walking. Denies chest pain or tightness. No significant cough or fever. No leg swelling. She's not having any orthopnea or using any increase pillows at night. She is feeling like she is dehydrated because her skin is so dry. When asked about how long her skin has been dry she states for "a while". It has been worsening over the last few days. She's also concerned that her electrolytes are abnormal. She's been having a dry mouth and sore throat.   Past Medical History  Diagnosis Date  . Hypertension   . Hyperlipidemia   . Cerebrovascular disease, unspecified   . Unspecified glaucoma   . Obesity, unspecified   . Coronary artery disease     cath 04/06/10: LAD occluded with R-L collats; med Rx WTC....probable V. Tach...med Rx  . Ventricular tachycardia   . SVT (supraventricular tachycardia)   . AAA (abdominal aortic aneurysm)   . COPD (chronic obstructive pulmonary disease) 2013    [place] of breath     WITH ACTIVITY--USES OXYGEN AS NEEDED  2&1/2 L PER NASAL CANNUL  . Headache(784.0)     PAST HX MIGRAINES  . Arthritis   . Complication of anesthesia     PT STATES SHE CAN NOT BE PUT TO SLEEP BECAUSE OF HER LUNG PROBLEMS  . Carotid artery occlusion   . Cancer 2009    BLADDER CANCER  . Anemia    Past Surgical History  Procedure Laterality Date  . Cystoscopy    . Bladder tumor excision      resection of 5 bladder, and fulguration of 1 small bladder tumor  . Cystoscopy       cold cup bladder biopsy of five tumors, and fulguration of one tumor  . Lung surgery      Remote right lung  . Tubal ligation      Bilateral  . Cystoscopy with biopsy  12/24/2011    Procedure: CYSTOSCOPY WITH BIOPSY;  Surgeon: Fredricka Bonine, MD;  Location: [facility];  Service: Urology;  Laterality: N/A;   Family History  Problem Relation Age of Onset  . Heart disease Mother 75  . Heart disease Father   . Hyperlipidemia Father   . Hypertension Father   . Diabetes Daughter   . Peripheral vascular disease Daughter    History  Substance Use Topics  . Smoking status: Former Smoker -- 0.30 packs/day for 58 years    Types: Cigarettes    Quit date: 05/18/2011  . Smokeless tobacco: Never Used  . Alcohol Use: No   OB History   Grav Para Term Preterm Abortions TAB SAB Ect Mult Living                 Review of Systems  Constitutional: Negative for fever.  Respiratory: Positive for shortness of breath. Negative for cough.   Cardiovascular: Negative for chest pain and leg swelling.  Gastrointestinal: Negative for vomiting.  Skin:       Dry skin  All other systems reviewed and are negative.  Allergies  Albuterol; Levaquin; and Sulfa antibiotics  Home Medications   Prior to Admission medications   Medication Sig Start Date End Date Taking? Authorizing Provider  acetaminophen-codeine (TYLENOL #3) 300-30 MG per tablet Take 1 tablet by mouth every 8 (eight) hours as needed.     Historical Provider, MD  amiodarone (PACERONE) 200 MG tablet Take 200 mg by mouth at bedtime.  09/20/11   Lelon Perla, MD  aspirin EC 81 MG tablet Take 81 mg by mouth at bedtime.    Historical Provider, MD  Aspirin-Acetaminophen-Caffeine ([place]) [phone] MG PACK As directed    Historical Provider, MD  budesonide-formoterol (SYMBICORT) 160-4.5 MCG/ACT inhaler Inhale 2 puffs into the lungs 2 (two) times daily. 09/07/13   Tammy S Parrett, NP  clopidogrel (PLAVIX) 75 MG tablet Take  1 tablet (75 mg total) by mouth at bedtime. 12/26/11   Festus Aloe, MD  diltiazem (CARDIZEM CD) 300 MG 24 hr capsule TAKE 1 CAPSULE IN THE EVENING.    Lelon Perla, MD  doxycycline (VIBRA-TABS) 100 MG tablet Take 1 tablet (100 mg total) by mouth 2 (two) times daily. 09/07/13   Tammy S Parrett, NP  furosemide (LASIX) 20 MG tablet TAKE (1) TABLET BY MOUTH ONCE DAILY AS NEEDED.    Lelon Perla, MD  ipratropium (ATROVENT) 0.02 % nebulizer solution USE 1 VIAL IN NEBULIZER EVERY 6 HOURS. 10/13/13   Tanda Rockers, MD  isosorbide mononitrate (IMDUR) 30 MG 24 hr tablet TAKE ONE TABLET BY MOUTH AT BEDTIME.    Lelon Perla, MD  levalbuterol [facility] HFA) 45 MCG/ACT inhaler Inhale 2 puffs into the lungs every 4 (four) hours as needed for wheezing. 09/07/13   Tammy S Parrett, NP  nitroGLYCERIN (NITROSTAT) 0.4 MG SL tablet Place 0.4 mg under the tongue every 5 (five) minutes as needed. Chest pain 07/10/10   Lelon Perla, MD  pantoprazole (PROTONIX) 40 MG tablet TAKE 1 TABLET 30- 60 MINUTES BEFORE FIRST MEAL OF DAY. 01/19/13   Tanda Rockers, MD  potassium chloride SA (K-DUR,KLOR-CON) 20 MEQ tablet Take 20 mEq by mouth 2 (two) times daily.     Historical Provider, MD  pravastatin (PRAVACHOL) 80 MG tablet Take 80 mg by mouth at bedtime.    Historical Provider, MD  predniSONE (DELTASONE) 10 MG tablet 4 tabs for 2 days, then 3 tabs for 2 days, 2 tabs for 2 days, then 1 tab for 2 days, then stop 09/07/13   Tammy S Parrett, NP  promethazine (PHENERGAN) 25 MG tablet Take 25 mg by mouth every 4 (four) hours as needed. For nausea    Historical Provider, MD  ranitidine (ZANTAC) 150 MG tablet Take 1 tablet (150 mg total) by mouth at bedtime. 08/26/12   Tanda Rockers, MD  travoprost, benzalkonium, (TRAVATAN) 0.004 % ophthalmic solution Place 1 drop into both eyes at bedtime.     Historical Provider, MD   BP 122/60  Pulse 97  Resp 19  SpO2 99% Physical Exam  Nursing note and vitals  reviewed. Constitutional: She is oriented to person, place, and time. She appears well-developed and well-nourished. No distress.  HENT:  Head: Normocephalic and atraumatic.  Right Ear: External ear normal.  Left Ear: External ear normal.  Nose: Nose normal.  Eyes: Right eye exhibits no discharge. Left eye exhibits no discharge.  Cardiovascular: Normal rate, regular rhythm and normal heart sounds.   Pulmonary/Chest: She has decreased breath sounds.  Increased WOB  Abdominal: Soft. There is no  tenderness.  Musculoskeletal: She exhibits no edema.  Neurological: She is alert and oriented to person, place, and time.  Skin: Skin is warm and dry.    ED Course  Procedures (including critical care time) Labs Review Labs Reviewed  CBC WITH DIFFERENTIAL - Abnormal; Notable for the following:    WBC 12.2 (*)    Hemoglobin 11.7 (*)    MCH 25.7 (*)    RDW 17.2 (*)    Neutrophils Relative % 81 (*)    Neutro Abs 9.9 (*)    Lymphocytes Relative 11 (*)    All other components within normal limits  COMPREHENSIVE METABOLIC PANEL - Abnormal; Notable for the following:    Potassium 3.6 (*)    Glucose, Bld 134 (*)    AST 43 (*)    GFR calc non Af Amer 86 (*)    All other components within normal limits  I-STAT VENOUS BLOOD GAS, ED - Abnormal; Notable for the following:    pH, Ven 7.329 (*)    pCO2, Ven 72.1 (*)    pO2, Ven 28.0 (*)    Bicarbonate 38.0 (*)    Acid-Base Excess 9.0 (*)    All other components within normal limits  I-STAT TROPOININ, ED    Imaging Review Dg Chest Portable 1 View  10/20/2013   CLINICAL DATA:  Shortness of breath.  EXAM: PORTABLE CHEST - 1 VIEW  COMPARISON:  09/07/2013 and 12/14/2012.  FINDINGS: Trachea is midline. Heart size stable. Pleural parenchymal scarring at the base of the right hemi thorax. Minimal scarring at the left lung base. Lungs are otherwise clear. No pleural fluid.  IMPRESSION: No acute findings.   Electronically Signed   By: Lorin Picket M.D.    On: 10/20/2013 14:18     EKG Interpretation   Date/Time:  Wednesday October 20 2013 13:38:05 EDT Ventricular Rate:  96 PR Interval:  160 QRS Duration: 159 QT Interval:  443 QTC Calculation: 560 R Axis:   101 Text Interpretation:  Age not entered, assumed to be  73 years old for  purpose of ECG interpretation Sinus rhythm Nonspecific intraventricular  conduction delay conduction delay appears new Confirmed by Melania Kirks  MD,  Abeeha Twist ([id number]) on 10/20/2013 1:43:47 PM      MDM   Final diagnoses:  Acute on chronic respiratory failure with hypercapnia    Patient feels somewhat improved after Xopenex but still not well enough to go home. Is unclear the exact etiology of her dyspnea but is likely acute on chronic lung disease. She does have a mild acidosis on VBG (refused ABG). At this point I do not feel she needs acute BiPAP. We'll continue to hydrate, give breathing treatments, and admit for worsening shortness of breath to the hospitalist.    Ephraim Hamburger, MD 10/20/13 [phone]

## 2013-10-20 NOTE — ED Notes (Signed)
IV team at bedside 

## 2013-10-20 NOTE — ED Notes (Signed)
Attempted IV start, unsuccessful. IV team has been paged.

## 2013-10-20 NOTE — ED Notes (Signed)
Per [facility], pt has been experiencing increased shortness of breath X3 days. Pt uses 4L of O2 at home. Pt very dyspneic on exertion and also orthopneic. Pt heavily breathing on arrival.

## 2013-10-20 NOTE — ED Notes (Signed)
Lab results was given to [place] ,[place].

## 2013-10-20 NOTE — H&P (Signed)
[facility] History and Physical  MARSENA TAFF WER:[id number] DOB: 10/02/1940 DOA: 10/20/2013   PCP: Sherrie Mustache, MD  Specialists: Followed by Dr. Stanford Breed, with cardiology. Followed by Dr. Melvyn Novas with pulmonology.  Chief Complaint: Shortness of breath for 2 days  HPI: [name] [name] is a 73 y.o. female with a past medical history of COPD, coronary artery disease on medical management, history of ventricular tachycardia on amiodarone, who was in her usual state of health till about 2-3 days ago, when she started noticing that her breathing was getting worse. This was mainly with exertion. She has chronic shortness of breath, but this has been getting worse. She denies any cough, no fever. Has been taking her medications as prescribed. Denies any chest pain. She had noticed that when she would exert she would feel like she was going to pass out, but denies any syncopal episode. Has had some nausea, chronically, but denies any vomiting. Denies any abdominal pain. No recent travel anywhere. She is on home oxygen at 4 L per minute. Denies any leg swelling. No history suggestive of orthopnea or PND.  Home Medications: Prior to Admission medications   Medication Sig Start Date End Date Taking? Authorizing Provider  acetaminophen-codeine (TYLENOL #3) 300-30 MG per tablet Take 1 tablet by mouth every 8 (eight) hours as needed for moderate pain.    Yes Historical Provider, MD  amiodarone (PACERONE) 200 MG tablet Take 200 mg by mouth at bedtime.  09/20/11  Yes Lelon Perla, MD  aspirin EC 81 MG tablet Take 81 mg by mouth at bedtime.   Yes Historical Provider, MD  Aspirin-Acetaminophen-Caffeine (GOODYS EXTRA STRENGTH) [phone] MG PACK Take 1 packet by mouth daily as needed (for pain).    Yes Historical Provider, MD  budesonide-formoterol (SYMBICORT) 160-4.5 MCG/ACT inhaler Inhale 2 puffs into the lungs 2 (two) times daily. 09/07/13  Yes Tammy S Parrett, NP  clopidogrel (PLAVIX) 75 MG tablet  Take 1 tablet (75 mg total) by mouth at bedtime. 12/26/11  Yes Festus Aloe, MD  Dextromethorphan-Guaifenesin ([place] FAST-MAX DM MAX) 5-100 MG/5ML LIQD Take 5 mLs by mouth daily as needed (for congestion).   Yes Historical Provider, MD  diltiazem (CARDIZEM CD) 300 MG 24 hr capsule Take 300 mg by mouth daily.   Yes Historical Provider, MD  furosemide (LASIX) 20 MG tablet Take 10 mg by mouth 2 (two) times daily.   Yes Historical Provider, MD  ipratropium (ATROVENT) 0.02 % nebulizer solution Take 0.5 mg by nebulization every 6 (six) hours as needed for wheezing or shortness of breath.   Yes Historical Provider, MD  isosorbide mononitrate (IMDUR) 30 MG 24 hr tablet Take 30 mg by mouth daily.   Yes Historical Provider, MD  levalbuterol [facility] HFA) 45 MCG/ACT inhaler Inhale 2 puffs into the lungs every 4 (four) hours as needed for wheezing. 09/07/13  Yes Tammy S Parrett, NP  nitroGLYCERIN (NITROSTAT) 0.4 MG SL tablet Place 0.4 mg under the tongue every 5 (five) minutes as needed. Chest pain 07/10/10  Yes Lelon Perla, MD  pantoprazole (PROTONIX) 40 MG tablet Take 40 mg by mouth daily.   Yes Historical Provider, MD  potassium chloride SA (K-DUR,KLOR-CON) 20 MEQ tablet Take 20 mEq by mouth 2 (two) times daily.    Yes Historical Provider, MD  pravastatin (PRAVACHOL) 80 MG tablet Take 80 mg by mouth at bedtime.   Yes Historical Provider, MD  promethazine (PHENERGAN) 25 MG tablet Take 25 mg by mouth every 4 (four) hours as needed.  For nausea   Yes Historical Provider, MD  Travoprost, BAK Free, (TRAVATAN) 0.004 % SOLN ophthalmic solution Place 1 drop into both eyes at bedtime.   Yes Historical Provider, MD    Allergies:  Allergies  Allergen Reactions  . Albuterol Other (See Comments)    Jittery/shakiness  . Levaquin [Levofloxacin In D5w] Nausea Only  . Sulfa Antibiotics Nausea Only    Past Medical History: Past Medical History  Diagnosis Date  . Hypertension   . Hyperlipidemia   .  Cerebrovascular disease, unspecified   . Unspecified glaucoma   . Obesity, unspecified   . Coronary artery disease     cath 04/06/10: LAD occluded with R-L collats; med Rx WTC....probable V. Tach...med Rx  . Ventricular tachycardia   . SVT (supraventricular tachycardia)   . AAA (abdominal aortic aneurysm)   . COPD (chronic obstructive pulmonary disease) 2013    [place] of breath     WITH ACTIVITY--USES OXYGEN AS NEEDED  2&1/2 L PER NASAL CANNUL  . Headache(784.0)     PAST HX MIGRAINES  . Arthritis   . Complication of anesthesia     PT STATES SHE CAN NOT BE PUT TO SLEEP BECAUSE OF HER LUNG PROBLEMS  . Carotid artery occlusion   . Cancer 2009    BLADDER CANCER  . Anemia     Past Surgical History  Procedure Laterality Date  . Cystoscopy    . Bladder tumor excision      resection of 5 bladder, and fulguration of 1 small bladder tumor  . Cystoscopy      cold cup bladder biopsy of five tumors, and fulguration of one tumor  . Lung surgery      Remote right lung  . Tubal ligation      Bilateral  . Cystoscopy with biopsy  12/24/2011    Procedure: CYSTOSCOPY WITH BIOPSY;  Surgeon: Fredricka Bonine, MD;  Location: [facility];  Service: Urology;  Laterality: N/A;    Social History: She lives in [place] with her husband. Quit smoking 3 years ago. Has approximately 100-pack-year history of smoking. No alcohol use. No illicit drug use. Independent with daily activities.  Family History:  Family History  Problem Relation Age of Onset  . Heart disease Mother 51  . Heart disease Father   . Hyperlipidemia Father   . Hypertension Father   . Diabetes Daughter   . Peripheral vascular disease Daughter      Review of Systems - History obtained from the patient General ROS: positive for  - fatigue Psychological ROS: negative Ophthalmic ROS: negative ENT ROS: negative Allergy and Immunology ROS: negative Hematological and Lymphatic ROS: negative Endocrine ROS:  negative Respiratory ROS: as in hpi Cardiovascular ROS: as in hpi Gastrointestinal ROS: no abdominal pain, change in bowel habits, or black or bloody stools Genito-Urinary ROS: no dysuria, trouble voiding, or hematuria Musculoskeletal ROS: negative Neurological ROS: no TIA or stroke symptoms Dermatological ROS: chronic "skin pain" all over  Physical Examination  Filed Vitals:   10/20/13 1336 10/20/13 1428 10/20/13 1633 10/20/13 1706  BP: 122/60   128/59  Pulse: 97   97  Resp: 19     SpO2: 99% 100% 96% 97%    BP 128/59  Pulse 97  Resp 19  SpO2 97%  General appearance: alert, cooperative, appears stated age and no distress Head: Normocephalic, without obvious abnormality, atraumatic Eyes: conjunctivae/corneas clear. PERRL, EOM's intact.  Throat: lips, mucosa, and tongue normal; teeth and gums normal Neck:  no adenopathy, no carotid bruit, no JVD, supple, symmetrical, trachea midline and thyroid not enlarged, symmetric, no tenderness/mass/nodules Back: symmetric, no curvature. ROM normal. No CVA tenderness. Resp: Decreased air entry bilaterally. No definite crackles or wheezing. No rhonchi. Cardio: regular rate and rhythm, S1, S2 normal, no murmur, click, rub or gallop GI: soft, non-tender; bowel sounds normal; no masses,  no organomegaly Extremities: extremities normal, atraumatic, no cyanosis or edema Pulses: 2+ and symmetric Skin: Skin color, texture, turgor normal. No rashes or lesions Lymph nodes: Cervical, supraclavicular, and axillary nodes normal. Neurologic: No focal deficits. Alert and oriented x3. Gait not assessed.  Laboratory Data: Results for orders placed during the hospital encounter of 10/20/13 (from the past 48 hour(s))  CBC WITH DIFFERENTIAL     Status: Abnormal   Collection Time    10/20/13  2:50 PM      Result Value Ref Range   WBC 12.2 (*) 4.0 - 10.5 K/uL   RBC 4.55  3.87 - 5.11 MIL/uL   Hemoglobin 11.7 (*) 12.0 - 15.0 g/dL   HCT 38.9  36.0 - 46.0 %     MCV 85.5  78.0 - 100.0 fL   MCH 25.7 (*) 26.0 - 34.0 pg   MCHC 30.1  30.0 - 36.0 g/dL   RDW 17.2 (*) 11.5 - 15.5 %   Platelets 279  150 - 400 K/uL   Neutrophils Relative % 81 (*) 43 - 77 %   Neutro Abs 9.9 (*) 1.7 - 7.7 K/uL   Lymphocytes Relative 11 (*) 12 - 46 %   Lymphs Abs 1.3  0.7 - 4.0 K/uL   Monocytes Relative 8  3 - 12 %   Monocytes Absolute 0.9  0.1 - 1.0 K/uL   Eosinophils Relative 0  0 - 5 %   Eosinophils Absolute 0.1  0.0 - 0.7 K/uL   Basophils Relative 0  0 - 1 %   Basophils Absolute 0.0  0.0 - 0.1 K/uL  COMPREHENSIVE METABOLIC PANEL     Status: Abnormal   Collection Time    10/20/13  2:50 PM      Result Value Ref Range   Sodium 142  137 - 147 mEq/L   Potassium 3.6 (*) 3.7 - 5.3 mEq/L   Chloride 96  96 - 112 mEq/L   CO2 31  19 - 32 mEq/L   Glucose, Bld 134 (*) 70 - 99 mg/dL   BUN 10  6 - 23 mg/dL   Creatinine, Ser 0.66  0.50 - 1.10 mg/dL   Calcium 9.4  8.4 - 10.5 mg/dL   Total Protein 7.3  6.0 - 8.3 g/dL   Albumin 3.5  3.5 - 5.2 g/dL   AST 43 (*) 0 - 37 U/L   ALT 30  0 - 35 U/L   Alkaline Phosphatase 91  39 - 117 U/L   Total Bilirubin 0.4  0.3 - 1.2 mg/dL   GFR calc non Af Amer 86 (*) >90 mL/min   GFR calc Af Amer >90  >90 mL/min   Comment: (NOTE)     The eGFR has been calculated using the CKD EPI equation.     This calculation has not been validated in all clinical situations.     eGFR's persistently <90 mL/min signify possible Chronic Kidney     Disease.   Anion gap 15  5 - 15  I-STAT TROPOININ, ED     Status: None   Collection Time    10/20/13  2:58 PM  Result Value Ref Range   Troponin i, poc 0.00  0.00 - 0.08 ng/mL   Comment 3            Comment: Due to the release kinetics of cTnI,     a negative result within the first hours     of the onset of symptoms does not rule out     myocardial infarction with certainty.     If myocardial infarction is still suspected,     repeat the test at appropriate intervals.  I-STAT VENOUS BLOOD GAS, ED      Status: Abnormal   Collection Time    10/20/13  3:01 PM      Result Value Ref Range   pH, Ven 7.329 (*) 7.250 - 7.300   pCO2, Ven 72.1 (*) 45.0 - 50.0 mmHg   pO2, Ven 28.0 (*) 30.0 - 45.0 mmHg   Bicarbonate 38.0 (*) 20.0 - 24.0 mEq/L   TCO2 40  0 - 100 mmol/L   O2 Saturation 46.0     Acid-Base Excess 9.0 (*) 0.0 - 2.0 mmol/L   Sample type VENOUS     Comment NOTIFIED PHYSICIAN      Radiology Reports: Dg Chest Portable 1 View  10/20/2013   CLINICAL DATA:  Shortness of breath.  EXAM: PORTABLE CHEST - 1 VIEW  COMPARISON:  09/07/2013 and 12/14/2012.  FINDINGS: Trachea is midline. Heart size stable. Pleural parenchymal scarring at the base of the right hemi thorax. Minimal scarring at the left lung base. Lungs are otherwise clear. No pleural fluid.  IMPRESSION: No acute findings.   Electronically Signed   By: Lorin Picket M.D.   On: 10/20/2013 14:18    Electrocardiogram: Sinus rhythm, 96 beats per minute. Normal axis. Right bundle branch blocks noted. Intra ventricular Conduction delay is noted. No concerning ST changes. Nonspecific T wave, changes.  Problem List  Principal Problem:   Acute on chronic respiratory failure Active Problems:   COPD GOLD III 02 dep   CAD   Acute exacerbation of chronic obstructive pulmonary disease (COPD)   Anxiety disorder   History of ventricular tachycardia   Assessment: This is a 73 year old, Caucasian female, who presents with worsening shortness of breath for the last 2-3 days. She appears to have acute COPD exacerbation with acute respiratory failure. PCO2 is elevated, but close to her baseline. After receiving nebulizer treatments in the ED, she is feeling somewhat better.  Plan: #1 acute respiratory failure with acute COPD exacerbation: She will be treated with steroids July the treatments, and antibiotics. Oxygen will be continued. She does not need stepdown status at this time. She can be monitored just on telemetry  #2 abnormal EKG: Perhaps  the interventricular conduction delay is more pronounced compared to previous EKG but it appears quite similar to previous EKG. Troponin is normal. We'll continue to trend. Troponins. EKG will be repeated in the morning. If troponins are normal, then I anticipate no further evaluation in the hospital. She had a cardiac catheterization in 2012, which revealed high-grade stenosis of the LAD, and high grade calcified nodule involving the proximal circumflex artery. She was not considered a good candidate for intervention at that time.  #3 history of coronary artery disease, on medical management: Continue with her home medications and antiplatelet agents. She's not on beta blocker, presumably due to her severe lung disease.   #4 history of ventricular tachycardia: Continue with amiodarone.   DVT Prophylaxis: Lovenox Code Status: Full code Family Communication: Discussed with the  patient  Disposition Plan: Admit to telemetry   Further management decisions will depend on results of further testing and patient's response to treatment.   [facility]  [facility] Pager [phone]  If 7PM-7AM, please contact night-coverage [email or web address] Password [facility]  10/20/2013, 5:14 PM  Disclaimer: This note was dictated with voice recognition software. Similar sounding words can inadvertently be transcribed and may not be corrected upon review.

## 2013-10-21 LAB — CBC
HCT: 33 % — ABNORMAL LOW (ref 36.0–46.0)
Hemoglobin: 10 g/dL — ABNORMAL LOW (ref 12.0–15.0)
MCH: 26 pg (ref 26.0–34.0)
MCHC: 30.3 g/dL (ref 30.0–36.0)
MCV: 85.9 fL (ref 78.0–100.0)
Platelets: 247 10*3/uL (ref 150–400)
RBC: 3.84 MIL/uL — ABNORMAL LOW (ref 3.87–5.11)
RDW: 17.2 % — AB (ref 11.5–15.5)
WBC: 10.3 10*3/uL (ref 4.0–10.5)

## 2013-10-21 LAB — COMPREHENSIVE METABOLIC PANEL
ALK PHOS: 74 U/L (ref 39–117)
ALT: 26 U/L (ref 0–35)
ANION GAP: 19 — AB (ref 5–15)
AST: 34 U/L (ref 0–37)
Albumin: 3.1 g/dL — ABNORMAL LOW (ref 3.5–5.2)
BUN: 13 mg/dL (ref 6–23)
CO2: 26 meq/L (ref 19–32)
CREATININE: 0.61 mg/dL (ref 0.50–1.10)
Calcium: 9.1 mg/dL (ref 8.4–10.5)
Chloride: 97 mEq/L (ref 96–112)
GFR calc Af Amer: >90 mL/min (ref >90)
GFR, EST NON AFRICAN AMERICAN: 88 mL/min — AB (ref >90)
GLUCOSE: 147 mg/dL — AB (ref 70–99)
Potassium: 4.3 mEq/L (ref 3.7–5.3)
Sodium: 142 mEq/L (ref 137–147)
Total Bilirubin: 0.3 mg/dL (ref 0.3–1.2)
Total Protein: 6.2 g/dL (ref 6.0–8.3)

## 2013-10-21 LAB — TROPONIN I

## 2013-10-21 MED ORDER — LEVALBUTEROL HCL 0.63 MG/3ML IN NEBU: 0.6300 mg | INHALATION_SOLUTION | RESPIRATORY_TRACT | Status: DC | PRN

## 2013-10-21 MED ORDER — DOXYCYCLINE HYCLATE 100 MG PO TABS
100.0000 mg | ORAL_TABLET | Freq: Two times a day (BID) | ORAL | Status: DC
  Administered 2013-10-21 – 2013-10-22 (×3): 100 mg via ORAL
  Filled 2013-10-21 (×4): qty 1

## 2013-10-21 MED ORDER — CLONAZEPAM 0.5 MG PO TABS
0.2500 mg | ORAL_TABLET | Freq: Two times a day (BID) | ORAL | Status: DC
  Administered 2013-10-21 – 2013-10-22 (×3): 0.25 mg via ORAL
  Filled 2013-10-21 (×3): qty 1

## 2013-10-21 NOTE — Progress Notes (Signed)
Patient Demographics  [name] [name], is a 73 y.o. female, DOB - Oct 18, 1940, RDE:[id number]  Admit date - 10/20/2013   Admitting Physician Bonnielee Haff, MD  Outpatient Primary MD for the patient is Sherrie Mustache, MD  LOS - 1   Chief Complaint  Patient presents with  . Shortness of Breath        Subjective:   [name] [name] today has, No headache, No chest pain, No abdominal pain - No Nausea, No new weakness tingling or numbness, Improved Cough - SOB.   Assessment & Plan    #1 Acute on chronic hypoxic respiratory failure with acute COPD exacerbation: Has chronic respiratory failure with advanced underlying COPD, uses Foley to nasal cannula oxygen at home. Placed on IV Solu-Medrol, schedule an as needed nebulizer treatments along with supplemental oxygen with improvement. Will replace Rocephin with doxycycline for atypical coverage and monitor.    #2. Abnormal EKG: Perhaps the interventricular conduction delay is more pronounced compared to previous EKG . Troponin x3 sets. Repeat EKG . She had a cardiac catheterization in 2012, which revealed high-grade stenosis of the LAD, and high grade calcified nodule involving the proximal circumflex artery. She was not considered a good candidate for intervention at that time. She is symptom-free and chest pain-free continue medical management with aspirin, Plavix, statin, Imdur, Cardizem. She's not on beta blocker due to advanced COPD.    #3 History of coronary artery disease, on medical management: Continue with her home medications as in #2 above, She's not on beta blocker, presumably due to her severe lung disease.     #4 History of ventricular tachycardia: Continue with amiodarone. Monitor on telemetry, last EF 60% on echogram and 2013.      Code  Status: Full  Family Communication: none present  Disposition Plan: Home  Procedures     Consults     Medications  Scheduled Meds: . amiodarone  200 mg Oral QHS  . aspirin EC  81 mg Oral QHS  . atorvastatin  20 mg Oral q1800  . budesonide-formoterol  2 puff Inhalation BID  . cefTRIAXone (ROCEPHIN)  IV  1 g Intravenous Q24H  . clonazePAM  0.25 mg Oral BID  . clopidogrel  75 mg Oral QHS  . diltiazem  300 mg Oral Daily  . docusate sodium  100 mg Oral BID  . enoxaparin (LOVENOX) injection  40 mg Subcutaneous Q24H  . furosemide  10 mg Oral BID  . ipratropium  0.5 mg Nebulization Q4H  . isosorbide mononitrate  30 mg Oral Daily  . latanoprost  1 drop Both Eyes QHS  . levalbuterol  0.63 mg Nebulization Q4H  . methylPREDNISolone (SOLU-MEDROL) injection  60 mg Intravenous Q6H  . pantoprazole  40 mg Oral Daily  . potassium chloride SA  20 mEq Oral BID  . sodium chloride  3 mL Intravenous Q12H  . sodium chloride  3 mL Intravenous Q12H   Continuous Infusions:  PRN Meds:.sodium chloride, acetaminophen, acetaminophen, acetaminophen-codeine, levalbuterol, ondansetron (ZOFRAN) IV, ondansetron, sodium chloride  DVT Prophylaxis  Lovenox  Lab Results  Component Value Date   PLT 247 10/21/2013    Antibiotics    Anti-infectives   Start     Dose/Rate Route Frequency Ordered Stop   10/20/13 1900  cefTRIAXone (ROCEPHIN) 1 g in dextrose 5 % 50 mL IVPB     1 g 100 mL/hr over 30 Minutes Intravenous Every 24 hours 10/20/13 1812            Objective:   Filed Vitals:   10/21/13 0301 10/21/13 0349 10/21/13 0720 10/21/13 0731  BP:  128/55    Pulse:  86    Temp:  97.8 F (36.6 C)    TempSrc:  Oral    Resp:  22    Height:      Weight:  80 kg (176 lb 5.9 oz)    SpO2: 96% 100% 98% 94%    Wt Readings from Last 3 Encounters:  10/21/13 80 kg (176 lb 5.9 oz)  09/07/13 80.015 kg (176 lb 6.4 oz)  07/09/13 80.74 kg (178 lb)     Intake/Output Summary (Last 24 hours) at 10/21/13  1209 Last data filed at 10/21/13 0900  Gross per 24 hour  Intake   1320 ml  Output    550 ml  Net    770 ml     Physical Exam  Awake Alert, Oriented X 3, No new F.N deficits, Anxious affect [place].AT,PERRAL Supple Neck,No JVD, No cervical lymphadenopathy appriciated.  Symmetrical Chest wall movement, minimal air movement bilaterally, mild wheezing RRR,No Gallops,Rubs or new Murmurs, No Parasternal Heave +ve B.Sounds, Abd Soft, No tenderness, No organomegaly appriciated, No rebound - guarding or rigidity. No Cyanosis, Clubbing or edema, No new Rash or bruise      Data Review   Micro Results No results found for this or any previous visit (from the past 240 hour(s)).  Radiology Reports Dg Chest Portable 1 View  10/20/2013   CLINICAL DATA:  Shortness of breath.  EXAM: PORTABLE CHEST - 1 VIEW  COMPARISON:  09/07/2013 and 12/14/2012.  FINDINGS: Trachea is midline. Heart size stable. Pleural parenchymal scarring at the base of the right hemi thorax. Minimal scarring at the left lung base. Lungs are otherwise clear. No pleural fluid.  IMPRESSION: No acute findings.   Electronically Signed   By: Lorin Picket M.D.   On: 10/20/2013 14:18    CBC  Recent Labs Lab 10/20/13 1450 10/21/13 0630  WBC 12.2* 10.3  HGB 11.7* 10.0*  HCT 38.9 33.0*  PLT 279 247  MCV 85.5 85.9  MCH 25.7* 26.0  MCHC 30.1 30.3  RDW 17.2* 17.2*  LYMPHSABS 1.3  --   MONOABS 0.9  --   EOSABS 0.1  --   BASOSABS 0.0  --     Chemistries   Recent Labs Lab 10/20/13 1450 10/21/13 0630  NA 142 142  K 3.6* 4.3  CL 96 97  CO2 31 26  GLUCOSE 134* 147*  BUN 10 13  CREATININE 0.66 0.61  CALCIUM 9.4 9.1  AST 43* 34  ALT 30 26  ALKPHOS 91 74  BILITOT 0.4 0.3   ------------------------------------------------------------------------------------------------------------------ estimated creatinine clearance is 66.4 ml/min (by C-G formula based on Cr of  0.61). ------------------------------------------------------------------------------------------------------------------ No results found for this basename: HGBA1C,  in the last 72 hours ------------------------------------------------------------------------------------------------------------------ No results found for this basename: CHOL, HDL, LDLCALC, TRIG, CHOLHDL, LDLDIRECT,  in the last 72 hours ------------------------------------------------------------------------------------------------------------------ No results found for this basename: TSH, T4TOTAL, FREET3, T3FREE, THYROIDAB,  in the last 72 hours ------------------------------------------------------------------------------------------------------------------ No results found for this basename: VITAMINB12, FOLATE, FERRITIN, TIBC, IRON, RETICCTPCT,  in the last 72 hours  Coagulation profile No results found for this basename: INR, PROTIME,  in the last 168 hours  No results found for this basename: DDIMER,  in the last 72 hours  Cardiac Enzymes  Recent Labs Lab 10/20/13 1947 10/21/13 0013 10/21/13 0630  TROPONINI <0.30 <0.30 <0.30   ------------------------------------------------------------------------------------------------------------------ No components found with this basename: POCBNP,      Time Spent in minutes   35   Dravin Lance K M.D on 10/21/2013 at 12:09 PM  Between 7am to 7pm - Pager - [phone]  After 7pm go to [email or web address] - password [email or web address]  And look for the night coverage person covering for me after hours  [facility] Office  [phone]   **Disclaimer: This note may have been dictated with voice recognition software. Similar sounding words can inadvertently be transcribed and this note may contain transcription errors which may not have been corrected upon publication of note.**

## 2013-10-21 NOTE — Evaluation (Signed)
Occupational Therapy Evaluation Patient Details Name: MERYLE PUGMIRE MRN: [id number] DOB: 1940-07-19 Today's Date: 10/21/2013    History of Present Illness ARMINDA FOGLIO is a 73 y.o. female with a past medical history of COPD, coronary artery disease on medical management, history of ventricular tachycardia on amiodarone, who was in her usual state of health till about 2-3 days ago, when she started noticing that her breathing was getting worse.   Clinical Impression   Pt with decline in function and safety with ADLs and ADL mobility. Pt impulsive. Pt on home O2 and requires multiple rest breaks to recover and increase O2 >90% after minimal/modertae exertion. Pt would benefit from acute OT services to address impairments to increase  Level of function and safety    Follow Up Recommendations  No OT follow up    Equipment Recommendations   reacher   Recommendations for Other Services       Precautions / Restrictions Precautions Precautions: Fall Precaution Comments: oxygen Restrictions Weight Bearing Restrictions: No      Mobility Bed Mobility Overal bed mobility: Modified Independent                Transfers Overall transfer level: Independent Equipment used: 1 person hand held assist;None Transfers: Sit to/from Stand Sit to Stand: Supervision         General transfer comment: cues for hand placement and safety, to control speed during ambulation and for control of descent stand - sit tansitions    Balance Overall balance assessment: No apparent balance deficits (not formally assessed)                                          ADL Overall ADL's : Needs assistance/impaired     Grooming: Wash/dry hands;Wash/dry face;Standing;Supervision/safety   Upper Body Bathing: Sitting;Modified independent   Lower Body Bathing: Supervison/ safety;Set up   Upper Body Dressing : Modified independent;Sitting   Lower Body Dressing: Set up;Supervision/safety    Toilet Transfer: Supervision/safety;Grab bars;Comfort height toilet;Ambulation;Cueing for safety Toilet Transfer Details (indicate cue type and reason): cues for hand placement and safety, to control speed during ambulation and for control of descent stand - sit tansitions Toileting- Clothing Manipulation and Hygiene: Modified independent       Functional mobility during ADLs: Supervision/safety;Cueing for safety General ADL Comments: Pt veyr impulsive ad speedy during ADL mobility. Pt and her husband educated on energy conservation techniques and use of A/E (reacher) for home use. Pt states that she cannot breathe through her nose and that she takes her time at home     Vision  wears glasses for reading and driving per pt report                   Perception Perception Perception Tested?: No   Praxis Praxis Praxis tested?: Not tested    Pertinent Vitals/Pain Pain Assessment: 0-10 O2 95% before activity O2 83-86% during toileting and standing at sink for grooming Required 1.5 minutes to recover to 90-95%     Hand Dominance Right   Extremity/Trunk Assessment Upper Extremity Assessment Upper Extremity Assessment: Generalized weakness   Lower Extremity Assessment Lower Extremity Assessment: Defer to PT evaluation   Cervical / Trunk Assessment Cervical / Trunk Assessment: Kyphotic   Communication Communication Communication: No difficulties   Cognition Arousal/Alertness: Awake/alert Behavior During Therapy: WFL for tasks assessed/performed;Impulsive Overall Cognitive Status: Within Functional Limits for tasks  assessed                     General Comments   Pt pleasant, cooperative. Seems to be non compliant at home with energy conservation per husband's report                 [place] expects to be discharged to:: Private residence Living Arrangements: Spouse/significant other Available Help at Discharge: Family;Available 24  hours/day Type of Home: Mobile home Home Access: Stairs to enter Entrance Stairs-Number of Steps: 5 Entrance Stairs-Rails: Right;Left Home Layout: One level     Bathroom Shower/Tub: Teacher, early years/pre: Standard     Home Equipment: Environmental consultant - 2 wheels;Walker - 4 wheels;Bedside commode          Prior Functioning/Environment Level of Independence: Independent with assistive device(s)        Comments: loves cooking, listening to her scanner. Was transferring into the tub for bathing but recently has only sponged bathed due to fatigued. Limited to 30' ambulation due to SOB. Cooks but becomes fatigued. Does not drive    OT Diagnosis: Generalized weakness   OT Problem List: Decreased strength;Cardiopulmonary status limiting activity;Decreased activity tolerance   OT Treatment/Interventions: DME and/or AE instruction;Energy conservation;Therapeutic activities;Self-care/ADL training    OT Goals(Current goals can be found in the care plan section) Acute Rehab OT Goals Patient Stated Goal: be able to go home and cook again OT Goal Formulation: With patient/family Time For Goal Achievement: 10/21/13 Potential to Achieve Goals: Good ADL Goals Pt Will Perform Grooming: with modified independence;with set-up;standing Pt Will Perform Lower Body Bathing: with modified independence;with set-up;sit to/from stand Pt Will Perform Lower Body Dressing: with modified independence;with set-up;sit to/from stand Pt Will Transfer to Toilet: with modified independence;ambulating;regular height toilet Additional ADL Goal #1: Pt will verbalize/demonstrate energy conservation techniques for ADLs/selfcare and home mgt tasks  OT Frequency:     Barriers to D/C:    none                     End of Session    Activity Tolerance: Other (comment) (O2 Sats decreasing, multiple rest/recovery periods) Patient left: in bed;with call bell/phone within reach;with family/visitor present    Time: 1448-1856 OT Time Calculation (min): 25 min Charges:  OT General Charges $OT Visit: 1 Procedure OT Evaluation $Initial OT Evaluation Tier I: 1 Procedure OT Treatments $Therapeutic Activity: 8-22 mins G-Codes:    Britt Bottom 10/21/2013, 2:04 PM

## 2013-10-21 NOTE — Care Management Note (Unsigned)
    Page 1 of 1   10/21/2013     2:01:04 PM CARE MANAGEMENT NOTE 10/21/2013  Patient:  [name] [name], [name] [name]   Account Number:  [id number]  Date Initiated:  10/21/2013  Documentation initiated by:  Orma Cheetham  Subjective/Objective Assessment:   Pt adm on 10/20/13 with COPD exacerbation.  PTA, pt independent, lives with spouse.  She is on chronic home oxygen at 4L/[place].     Action/Plan:   Will follow for dc needs as pt progresses.   Anticipated DC Date:  10/24/2013   Anticipated DC Plan:  [place]  CM consult      Choice offered to / List presented to:             Status of service:  In process, will continue to follow Medicare Important Message given?   (If response is "NO", the following Medicare IM given date fields will be blank) Date Medicare IM given:   Medicare IM given by:   Date Additional Medicare IM given:   Additional Medicare IM given by:    Discharge Disposition:    Per UR Regulation:  Reviewed for med. necessity/level of care/duration of stay  If discussed at [place] of Stay Meetings, dates discussed:    Comments:

## 2013-10-21 NOTE — Evaluation (Signed)
Physical Therapy Evaluation Patient Details Name: [name] [name] MRN: [id number] DOB: 09/16/1940 Today's Date: 10/21/2013   History of Present Illness  [name] [name] is a 73 y.o. female with a past medical history of COPD, coronary artery disease on medical management, history of ventricular tachycardia on amiodarone, who was in her usual state of health till about 2-3 days ago, when she started noticing that her breathing was getting worse.  Clinical Impression  Pt pleasant but reports not being able to follow energy conservation strategies at home. Pt states when she gets fatigued she runs to sit vs stopping and breathing. Pt educated for conservation techniques with ambulation, transfers and daily activities, including bathing water temperature. Family also report pt typically is lying down most of the day between activities, encouraged sitting rather than lying, HEP and short increased activity within tolerance. Spouse present and educated along with pt. Pt will benefit from acute as well as HHPT to maximize function, strength and activity prior to D/C to increase quality of life.     Follow Up Recommendations Home health PT    Equipment Recommendations  None recommended by PT    Recommendations for Other Services       Precautions / Restrictions Precautions Precautions: Fall Precaution Comments: oxygen      Mobility  Bed Mobility Overal bed mobility: Modified Independent                Transfers Overall transfer level: Needs assistance   Transfers: Sit to/from Stand Sit to Stand: Supervision         General transfer comment: cues for hand placement and safety as pt with tendency to plop onto surface  Ambulation/Gait Ambulation/Gait assistance: Supervision Ambulation Distance (Feet): 10 Feet (10' then 37' after seated rest) Assistive device: Rolling walker (2 wheeled);None Gait Pattern/deviations: Step-through pattern;Decreased stride length;Wide base of support;Trunk  flexed     General Gait Details: 1st trial without DME with increased BOS, sway and fatigue. With RW cues for posture but increased ambulation distance and safety with recommendation for using rollator at all times to address fatigue and O2 need  Stairs            Wheelchair Mobility    Modified Rankin (Stroke Patients Only)       Balance                                             Pertinent Vitals/Pain Pain Assessment: No/denies pain HR 90-105 with activity sats 93% at rest on 4L with drop to 89% with 15' ambulation but quick recovery to 95% with seated rest    Home Living Family/patient expects to be discharged to:: Private residence Living Arrangements: Spouse/significant other Available Help at Discharge: Family;Available 24 hours/day Type of Home: Mobile home Home Access: Stairs to enter Entrance Stairs-Rails: Right;Left Entrance Stairs-Number of Steps: 5 Home Layout: One level Home Equipment: Walker - 2 wheels;Walker - 4 wheels      Prior Function Level of Independence: Independent with assistive device(s)         Comments: loves cooking, listening to her scanner. Was transferring into the tub for bathing but recently has only sponged bathed due to fatigued. Limited to 30' ambulation due to SOB. Cooks but becomes fatigued. Does not drive     Hand Dominance        Extremity/Trunk Assessment   Upper  Extremity Assessment: Generalized weakness           Lower Extremity Assessment: Generalized weakness      Cervical / Trunk Assessment: Kyphotic  Communication   Communication: No difficulties  Cognition Arousal/Alertness: Awake/alert Behavior During Therapy: WFL for tasks assessed/performed Overall Cognitive Status: Within Functional Limits for tasks assessed                      General Comments      Exercises General Exercises - Lower Extremity Long Arc Quad: AROM;Seated;Both;10 reps Hip ABduction/ADduction:  AROM;Seated;Both;10 reps Hip Flexion/Marching: AROM;Seated;Both;10 reps      Assessment/Plan    PT Assessment Patient needs continued PT services  PT Diagnosis Difficulty walking;Generalized weakness   PT Problem List Decreased activity tolerance;Decreased mobility;Decreased knowledge of use of DME;Cardiopulmonary status limiting activity  PT Treatment Interventions Gait training;Stair training;DME instruction;Functional mobility training;Therapeutic activities;Patient/family education;Therapeutic exercise   PT Goals (Current goals can be found in the Care Plan section) Acute Rehab PT Goals Patient Stated Goal: 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) PT Goal Formulation: With patient/family Time For Goal Achievement: 11/04/13 Potential to Achieve Goals: Fair    Frequency Min 3X/week   Barriers to discharge        Co-evaluation               End of Session Equipment Utilized During Treatment: Oxygen Activity Tolerance: Patient limited by fatigue Patient left: in chair;with call bell/phone within reach;with family/visitor present           Time: 1287-8676 PT Time Calculation (min): 28 min   Charges:   PT Evaluation $Initial PT Evaluation Tier I: 1 Procedure PT Treatments $Therapeutic Activity: 8-22 mins   PT G CodesMelford Aase 10/21/2013, 12:41 PM Elwyn Reach, [place]

## 2013-10-22 MED ORDER — DOXYCYCLINE HYCLATE 100 MG PO TABS: 100.0000 mg | ORAL_TABLET | Freq: Two times a day (BID) | ORAL | Status: DC

## 2013-10-22 MED ORDER — POLYETHYLENE GLYCOL 3350 17 G PO PACK
17.0000 g | PACK | Freq: Two times a day (BID) | ORAL | Status: DC
  Filled 2013-10-22 (×2): qty 1

## 2013-10-22 MED ORDER — LEVALBUTEROL HCL 0.63 MG/3ML IN NEBU
0.6300 mg | INHALATION_SOLUTION | Freq: Four times a day (QID) | RESPIRATORY_TRACT | Status: DC
  Administered 2013-10-22: 0.63 mg via RESPIRATORY_TRACT
  Filled 2013-10-22: qty 3

## 2013-10-22 MED ORDER — IPRATROPIUM BROMIDE 0.02 % IN SOLN
0.5000 mg | Freq: Four times a day (QID) | RESPIRATORY_TRACT | Status: DC
  Administered 2013-10-22: 0.5 mg via RESPIRATORY_TRACT
  Filled 2013-10-22: qty 2.5

## 2013-10-22 MED ORDER — DOCUSATE SODIUM 100 MG PO CAPS: 200.0000 mg | ORAL_CAPSULE | Freq: Two times a day (BID) | ORAL | Status: DC

## 2013-10-22 MED ORDER — PREDNISONE 5 MG PO TABS
ORAL_TABLET | ORAL | Status: DC
Start: 2013-10-22 — End: 2013-11-11

## 2013-10-22 NOTE — Discharge Summary (Signed)
[name] [name], is a 73 y.o. female  DOB 11/02/1940  MRN [id number].  Admission date:  10/20/2013  Admitting Physician  Bonnielee Haff, MD  Discharge Date:  10/22/2013   Primary MD  Sherrie Mustache, MD  Recommendations for primary care physician for things to follow:   Following her COPD clinically   Admission Diagnosis  Coronary atherosclerosis of unspecified type of vessel, native or graft [414.00] Acute exacerbation of chronic obstructive pulmonary disease (COPD) [491.21] History of ventricular tachycardia [V12.59] Acute on chronic respiratory failure with hypercapnia [518.84]   Discharge Diagnosis  Coronary atherosclerosis of unspecified type of vessel, native or graft [414.00] Acute exacerbation of chronic obstructive pulmonary disease (COPD) [491.21] History of ventricular tachycardia [V12.59] Acute on chronic respiratory failure with hypercapnia [518.84]    Principal Problem:   Acute on chronic respiratory failure Active Problems:   COPD GOLD III 02 dep   CAD   Acute exacerbation of chronic obstructive pulmonary disease (COPD)   Anxiety disorder   History of ventricular tachycardia      Past Medical History  Diagnosis Date  . Hypertension   . Hyperlipidemia   . Cerebrovascular disease, unspecified   . Unspecified glaucoma   . Obesity, unspecified   . Coronary artery disease     cath 04/06/10: LAD occluded with R-L collats; med Rx WTC....probable V. Tach...med Rx  . Ventricular tachycardia   . SVT (supraventricular tachycardia)   . AAA (abdominal aortic aneurysm)   . COPD (chronic obstructive pulmonary disease) 2013    [place] of breath     WITH ACTIVITY--USES OXYGEN AS NEEDED  2&1/2 L PER NASAL CANNUL  . Complication of anesthesia     PT STATES SHE CAN NOT BE PUT TO SLEEP BECAUSE OF  HER LUNG PROBLEMS  . Carotid artery occlusion   . Anemia   . CHF (congestive heart failure)   . Abdominal aneurysm     "hasn't been repaired" (10/20/2013)  . Pneumonia X ~ 2  . On home oxygen therapy     "4L; 24/7" (10/20/2013)  . GERD (gastroesophageal reflux disease)   . Migraines     "before tubal ligation I had them alot; seldom have one anymore" (10/20/2013)  . Arthritis     "knees mainly" (10/20/2013)  . Bladder cancer 2009    Past Surgical History  Procedure Laterality Date  . Cystoscopy    . Bladder tumor excision      resection of 5 bladder, and fulguration of 1 small bladder tumor  . Cystoscopy      cold cup bladder biopsy of five tumors, and fulguration of one tumor  . Wedge resection Right 1980's    Remote right lung  . Cystoscopy with biopsy  12/24/2011    Procedure: CYSTOSCOPY WITH BIOPSY;  Surgeon: Fredricka Bonine, MD;  Location: [facility];  Service: Urology;  Laterality: N/A;  . Tubal ligation Bilateral 1980's  . Cataract extraction w/ intraocular lens  implant, bilateral Bilateral ~ 2011  . Cardiac catheterization  "  several"    "too bad to put stents in"       History of present illness and  Hospital Course:     Kindly see H&P for history of present illness and admission details, please review complete Labs, Consult reports and Test reports for all details in brief  HPI  from the history and physical done on the day of admission  [name] [name] is a 73 y.o. female with a past medical history of COPD, coronary artery disease on medical management, history of ventricular tachycardia on amiodarone, who was in her usual state of health till about 2-3 days ago, when she started noticing that her breathing was getting worse. This was mainly with exertion. She has chronic shortness of breath, but this has been getting worse. She denies any cough, no fever. Has been taking her medications as prescribed. Denies any chest pain. She had noticed that when she would exert  she would feel like she was going to pass out, but denies any syncopal episode. Has had some nausea, chronically, but denies any vomiting. Denies any abdominal pain. No recent travel anywhere. She is on home oxygen at 4 L per minute. Denies any leg swelling. No history suggestive of orthopnea or PND.    Hospital Course    #1 Acute on chronic hypoxic respiratory failure with acute COPD exacerbation: Has chronic respiratory failure with advanced underlying COPD, uses 4lit/min nasal cannula oxygen at home. Prudent at baseline after IV Solu-Medrol, schedule an as needed nebulizer treatments along with supplemental oxygen and oral doxycycline. No pulse ox 100% on 4 L nasal cannula and she is symptom-free he could go home, will be discharged home on oral steroid taper and 5 more days of oral doxycycline with outpatient followup with PCP and her pulmonologist.    #2. Abnormal EKG: Right bundle branch block with some intraventricular conduction delay, she was completely symptom-free, these changes are chronic although slightly more pronounced initially during admission. Negative Troponin x3 sets.  She had a cardiac catheterization in 2012, which revealed high-grade stenosis of the LAD, and high grade calcified nodule involving the proximal circumflex artery. She was not considered a good candidate for intervention at that time. She is symptom-free and chest pain-free continue medical management with aspirin, Plavix, statin, Imdur, Cardizem. She's not on beta blocker due to advanced COPD. have requested her to follow with her primary cardiologist post discharge. She is completely symptom-free.   #3 History of coronary artery disease, on medical management: Continue with her home medications as in #2 above, She's not on beta blocker, presumably due to her severe lung disease.    #4 History of ventricular tachycardia: Continue with amiodarone. Monitor on telemetry, last EF 60% on echogram and 2013.        Discharge Condition: stable   Follow UP  Follow-up Information   Follow up with Sherrie Mustache, MD. Schedule an appointment as soon as possible for a visit in 1 week.   Specialty:  Family Medicine   Contact information:   [address] [place] [address] [phone]       Follow up with Christinia Gully, MD. Schedule an appointment as soon as possible for a visit in 1 week.   Specialty:  Pulmonary Disease   Contact information:   [address] [place] [address] [phone]       Follow up with Kirk Ruths, MD. Schedule an appointment as soon as possible for a visit in 1 week.   Specialty:  Cardiology   Contact information:   [address] [place] [address] [phone]         Discharge Instructions  and  Discharge Medications          Discharge Instructions   Diet - low sodium heart healthy    Complete by:  As directed      Discharge instructions    Complete by:  As directed   Follow with Primary MD Sherrie Mustache, MD in 7 days   Get CBC, CMP, 2 view Chest X ray checked  by Primary MD next visit.    Activity: As tolerated with Full fall precautions use walker/cane & assistance as needed   Disposition Home     Diet: Heart Healthy    For Heart failure patients - Check your Weight same time everyday, if you gain over 2 pounds, or you develop in leg swelling, experience more shortness of breath or chest pain, call your Primary MD immediately. Follow Cardiac Low Salt Diet and 1.8 lit/day fluid restriction.   On your next visit with her primary care physician please Get Medicines reviewed and adjusted.  Please request your Prim.MD to go over all Hospital Tests and Procedure/Radiological results at the follow up, please get all Hospital records sent to your Prim MD by signing hospital release before you go home.   If you experience worsening of your admission symptoms, develop shortness of breath, life  threatening emergency, suicidal or homicidal thoughts you must seek medical attention immediately by calling 911 or calling your MD immediately  if symptoms less severe.  You Must read complete instructions/literature along with all the possible adverse reactions/side effects for all the Medicines you take and that have been prescribed to you. Take any new Medicines after you have completely understood and accpet all the possible adverse reactions/side effects.   Do not drive, operating heavy machinery, perform activities at heights, swimming or participation in water activities or provide baby sitting services if your were admitted for syncope or siezures until you have seen by Primary MD or a Neurologist and advised to do so again.  Do not drive when taking Pain medications.    Do not take more than prescribed Pain, Sleep and Anxiety Medications  Special Instructions: If you have smoked or chewed Tobacco  in the last 2 yrs please stop smoking, stop any regular Alcohol  and or any Recreational drug use.  Wear Seat belts while driving.   Please note  You were cared for by a hospitalist during your hospital stay. If you have any questions about your discharge medications or the care you received while you were in the hospital after you are discharged, you can call the unit and asked to speak with the hospitalist on call if the hospitalist that took care of you is not available. Once you are discharged, your primary care physician will handle any further medical issues. Please note that NO REFILLS for any discharge medications will be authorized once you are discharged, as it is imperative that you return to your primary care physician (or establish a relationship with a primary care physician if you do not have one) for your aftercare needs so that they can reassess your need for medications and monitor your lab values.     Increase activity slowly    Complete by:  As directed              Medication List         acetaminophen-codeine  300-30 MG per tablet  Commonly known as:  TYLENOL #3  Take 1 tablet by mouth every 8 (eight) hours as needed for moderate pain.     amiodarone 200 MG tablet  Commonly known as:  PACERONE  Take 200 mg by mouth at bedtime.     aspirin EC 81 MG tablet  Take 81 mg by mouth at bedtime.     budesonide-formoterol 160-4.5 MCG/ACT inhaler  Commonly known as:  SYMBICORT  Inhale 2 puffs into the lungs 2 (two) times daily.     clopidogrel 75 MG tablet  Commonly known as:  PLAVIX  Take 1 tablet (75 mg total) by mouth at bedtime.     diltiazem 300 MG 24 hr capsule  Commonly known as:  CARDIZEM CD  Take 300 mg by mouth daily.     doxycycline 100 MG tablet  Commonly known as:  VIBRA-TABS  Take 1 tablet (100 mg total) by mouth every 12 (twelve) hours.     furosemide 20 MG tablet  Commonly known as:  LASIX  Take 10 mg by mouth 2 (two) times daily.     GOODYS EXTRA STRENGTH 500-325-65 MG Pack  Generic drug:  Aspirin-Acetaminophen-Caffeine  Take 1 packet by mouth daily as needed (for pain).     ipratropium 0.02 % nebulizer solution  Commonly known as:  ATROVENT  Take 0.5 mg by nebulization every 6 (six) hours as needed for wheezing or shortness of breath.     isosorbide mononitrate 30 MG 24 hr tablet  Commonly known as:  IMDUR  Take 30 mg by mouth daily.     levalbuterol 45 MCG/ACT inhaler  Commonly known as:  XOPENEX HFA  Inhale 2 puffs into the lungs every 4 (four) hours as needed for wheezing.     MUCINEX FAST-MAX DM MAX 5-100 MG/5ML Liqd  Generic drug:  Dextromethorphan-Guaifenesin  Take 5 mLs by mouth daily as needed (for congestion).     nitroGLYCERIN 0.4 MG SL tablet  Commonly known as:  NITROSTAT  Place 0.4 mg under the tongue every 5 (five) minutes as needed. Chest pain     pantoprazole 40 MG tablet  Commonly known as:  PROTONIX  Take 40 mg by mouth daily.     potassium chloride SA 20 MEQ tablet  Commonly known as:   K-DUR,KLOR-CON  Take 20 mEq by mouth 2 (two) times daily.     pravastatin 80 MG tablet  Commonly known as:  PRAVACHOL  Take 80 mg by mouth at bedtime.     predniSONE 5 MG tablet  Commonly known as:  DELTASONE  Label  & dispense according to the schedule below. 10 Pills PO for 3 days then, 8 Pills PO for 3 days, 6 Pills PO for 3 days, 4 Pills PO for 3 days, 2 Pills PO for 3 days, 1 Pills PO for 3 days, 1/2 Pill  PO for 3 days then STOP. Total 95 pills.     promethazine 25 MG tablet  Commonly known as:  PHENERGAN  Take 25 mg by mouth every 4 (four) hours as needed. For nausea     Travoprost (BAK Free) 0.004 % Soln ophthalmic solution  Commonly known as:  TRAVATAN  Place 1 drop into both eyes at bedtime.          Diet and Activity recommendation: See Discharge Instructions above   Consults obtained -  None   Major procedures and Radiology Reports - PLEASE review detailed and final reports for all details, in  brief -       Dg Chest Portable 1 View  10/20/2013   CLINICAL DATA:  Shortness of breath.  EXAM: PORTABLE CHEST - 1 VIEW  COMPARISON:  09/07/2013 and 12/14/2012.  FINDINGS: Trachea is midline. Heart size stable. Pleural parenchymal scarring at the base of the right hemi thorax. Minimal scarring at the left lung base. Lungs are otherwise clear. No pleural fluid.  IMPRESSION: No acute findings.   Electronically Signed   By: Lorin Picket M.D.   On: 10/20/2013 14:18    Micro Results      No results found for this or any previous visit (from the past 240 hour(s)).     Today   Subjective:   [name] [name] today has no headache,no chest abdominal pain,no new weakness tingling or numbness, feels much better wants to go home today.    Objective:   Blood pressure 124/69, pulse 90, temperature 98.1 F (36.7 C), temperature source Oral, resp. rate 18, height 5\' 5"  (1.651 m), weight 80 kg (176 lb 5.9 oz), SpO2 100.00%.   Intake/Output Summary (Last 24 hours) at 10/22/13  1029 Last data filed at 10/22/13 0945  Gross per 24 hour  Intake    720 ml  Output    400 ml  Net    320 ml    Exam Awake Alert, Oriented x 3, No new F.N deficits, Normal affect [place].AT,PERRAL Supple Neck,No JVD, No cervical lymphadenopathy appriciated.  Symmetrical Chest wall movement, Good air movement bilaterally, CTAB RRR,No Gallops,Rubs or new Murmurs, No Parasternal Heave +ve B.Sounds, Abd Soft, Non tender, No organomegaly appriciated, No rebound -guarding or rigidity. No Cyanosis, Clubbing or edema, No new Rash or bruise  Data Review   CBC w Diff: Lab Results  Component Value Date   WBC 10.3 10/21/2013   HGB 10.0* 10/21/2013   HCT 33.0* 10/21/2013   PLT 247 10/21/2013   LYMPHOPCT 11* 10/20/2013   MONOPCT 8 10/20/2013   EOSPCT 0 10/20/2013   BASOPCT 0 10/20/2013    CMP: Lab Results  Component Value Date   NA 142 10/21/2013   K 4.3 10/21/2013   CL 97 10/21/2013   CO2 26 10/21/2013   BUN 13 10/21/2013   CREATININE 0.61 10/21/2013   CREATININE 0.63 08/01/2011   PROT 6.2 10/21/2013   ALBUMIN 3.1* 10/21/2013   BILITOT 0.3 10/21/2013   ALKPHOS 74 10/21/2013   AST 34 10/21/2013   ALT 26 10/21/2013  .   Total Time in preparing paper work, data evaluation and todays exam - 35 minutes  Thurnell Lose M.D on 10/22/2013 at 10:29 AM  [facility] Office  [phone]   **Disclaimer: This note may have been dictated with voice recognition software. Similar sounding words can inadvertently be transcribed and this note may contain transcription errors which may not have been corrected upon publication of note.**

## 2013-10-22 NOTE — Discharge Instructions (Addendum)
Follow with Primary MD Sherrie Mustache, MD in 7 days   Get CBC, CMP, 2 view Chest X ray checked  by Primary MD next visit.    Activity: As tolerated with Full fall precautions use walker/cane & assistance as needed   Disposition Home     Diet: Heart Healthy    For Heart failure patients - Check your Weight same time everyday, if you gain over 2 pounds, or you develop in leg swelling, experience more shortness of breath or chest pain, call your Primary MD immediately. Follow Cardiac Low Salt Diet and 1.8 lit/day fluid restriction.   On your next visit with her primary care physician please Get Medicines reviewed and adjusted.  Please request your Prim.MD to go over all Hospital Tests and Procedure/Radiological results at the follow up, please get all Hospital records sent to your Prim MD by signing hospital release before you go home.   If you experience worsening of your admission symptoms, develop shortness of breath, life threatening emergency, suicidal or homicidal thoughts you must seek medical attention immediately by calling 911 or calling your MD immediately  if symptoms less severe.  You Must read complete instructions/literature along with all the possible adverse reactions/side effects for all the Medicines you take and that have been prescribed to you. Take any new Medicines after you have completely understood and accpet all the possible adverse reactions/side effects.   Do not drive, operating heavy machinery, perform activities at heights, swimming or participation in water activities or provide baby sitting services if your were admitted for syncope or siezures until you have seen by Primary MD or a Neurologist and advised to do so again.  Do not drive when taking Pain medications.    Do not take more than prescribed Pain, Sleep and Anxiety Medications  Special Instructions: If you have smoked or chewed Tobacco  in the last 2 yrs please stop smoking, stop any  regular Alcohol  and or any Recreational drug use.  Wear Seat belts while driving.   Please note  You were cared for by a hospitalist during your hospital stay. If you have any questions about your discharge medications or the care you received while you were in the hospital after you are discharged, you can call the unit and asked to speak with the hospitalist on call if the hospitalist that took care of you is not available. Once you are discharged, your primary care physician will handle any further medical issues. Please note that NO REFILLS for any discharge medications will be authorized once you are discharged, as it is imperative that you return to your primary care physician (or establish a relationship with a primary care physician if you do not have one) for your aftercare needs so that they can reassess your need for medications and monitor your lab values.  Chronic Obstructive Pulmonary Disease Exacerbation Chronic obstructive pulmonary disease (COPD) is a common lung condition in which airflow from the lungs is limited. COPD is a general term that can be used to describe many different lung problems that limit airflow, including chronic bronchitis and emphysema. COPD exacerbations are episodes when breathing symptoms become much worse and require extra treatment. Without treatment, COPD exacerbations can be life threatening, and frequent COPD exacerbations can cause further damage to your lungs. CAUSES   Respiratory infections.   Exposure to smoke.   Exposure to air pollution, chemical fumes, or dust. Sometimes there is no apparent cause or trigger. RISK FACTORS  Smoking cigarettes.  Older age.  Frequent prior COPD exacerbations. SIGNS AND SYMPTOMS   Increased coughing.   Increased thick spit (sputum) production.   Increased wheezing.   Increased shortness of breath.   Rapid breathing.   Chest tightness. DIAGNOSIS  Your medical history, a physical exam,  and tests will help your health care provider make a diagnosis. Tests may include:  A chest X-ray.  Basic lab tests.  Sputum testing.  An arterial blood gas test. TREATMENT  Depending on the severity of your COPD exacerbation, you may need to be admitted to a hospital for treatment. Some of the treatments commonly used to treat COPD exacerbations are:   Antibiotic medicines.   Bronchodilators. These are drugs that expand the air passages. They may be given with an inhaler or nebulizer. Spacer devices may be needed to help improve drug delivery.  Corticosteroid medicines.  Supplemental oxygen therapy.  HOME CARE INSTRUCTIONS   Do not smoke. Quitting smoking is very important to prevent COPD from getting worse and exacerbations from happening as often.  Avoid exposure to all substances that irritate the airway, especially to tobacco smoke.   If you were prescribed an antibiotic medicine, finish it all even if you start to feel better.  Take all medicines as directed by your health care provider.It is important to use correct technique with inhaled medicines.  Drink enough fluids to keep your urine clear or pale yellow (unless you have a medical condition that requires fluid restriction).  Use a cool mist vaporizer. This makes it easier to clear your chest when you cough.   If you have a home nebulizer and oxygen, continue to use them as directed.   Maintain all necessary vaccinations to prevent infections.   Exercise regularly.   Eat a healthy diet.   Keep all follow-up appointments as directed by your health care provider. SEEK IMMEDIATE MEDICAL CARE IF:  You have worsening shortness of breath.   You have trouble talking.   You have severe chest pain.  You have blood in your sputum.  You have a fever.  You have weakness, vomit repeatedly, or faint.   You feel confused.   You continue to get worse. MAKE SURE YOU:   Understand these  instructions.  Will watch your condition.  Will get help right away if you are not doing well or get worse. Document Released: 12/09/2006 Document Revised: 06/28/2013 Document Reviewed: 10/16/2012 [facility] Patient Information 2015 [place], [place]. This information is not intended to replace advice given to you by your health care provider. Make sure you discuss any questions you have with your health care provider.  Chronic Obstructive Pulmonary Disease Chronic obstructive pulmonary disease (COPD) is a common lung problem. In COPD, the flow of air from the lungs is limited. The way your lungs work will probably never return to normal, but there are things you can do to improve your lungs and make yourself feel better. HOME CARE  Take all medicines as told by your doctor.  Avoid medicines or cough syrups that dry up your airway (such as antihistamines) and do not allow you to get rid of thick spit. You do not need to avoid them if told differently by your doctor.  If you smoke, stop. Smoking makes the problem worse.  Avoid being around things that make your breathing worse (like smoke, chemicals, and fumes).  Use oxygen therapy and therapy to help improve your lungs (pulmonary rehabilitation) if told by your doctor. If you need home oxygen therapy, ask  your doctor if you should buy a tool to measure your oxygen level (oximeter).  Avoid people who have a sickness you can catch (contagious).  Avoid going outside when it is very hot, cold, or humid.  Eat healthy foods. Eat smaller meals more often. Rest before meals.  Stay active, but remember to also rest.  Make sure to get all the shots (vaccines) your doctor recommends. Ask your doctor if you need a pneumonia shot.  Learn and use tips on how to relax.  Learn and use tips on how to control your breathing as told by your doctor. Try:  Breathing in (inhaling) through your nose for 1 second. Then, pucker your lips and breath out  (exhale) through your lips for 2 seconds.  Putting one hand on your belly (abdomen). Breathe in slowly through your nose for 1 second. Your hand on your belly should move out. Pucker your lips and breathe out slowly through your lips. Your hand on your belly should move in as you breathe out.  Learn and use controlled coughing to clear thick spit from your lungs. The steps are: 1. Lean your head a little forward. 2. Breathe in deeply. 3. Try to hold your breath for 3 seconds. 4. Keep your mouth slightly open while coughing 2 times. 5. Spit any thick spit out into a tissue. 6. Rest and do the steps again 1 or 2 times as needed. GET HELP IF:  You cough up more thick spit than usual.  There is a change in the color or thickness of the spit.  It is harder to breathe than usual.  Your breathing is faster than usual. GET HELP RIGHT AWAY IF:   You have shortness of breath while resting.  You have shortness of breath that stops you from:  Being able to talk.  Doing normal activities.  You chest hurts for longer than 5 minutes.  Your skin color is more blue than usual.  Your pulse oximeter shows that you have low oxygen for longer than 5 minutes. MAKE SURE YOU:   Understand these instructions.  Will watch your condition.  Will get help right away if you are not doing well or get worse. Document Released: 07/31/2007 Document Revised: 06/28/2013 Document Reviewed: 10/08/2012 [facility] Patient Information 2015 [place], [place]. This information is not intended to replace advice given to you by your health care provider. Make sure you discuss any questions you have with your health care provider.  DASH Eating Plan DASH stands for "Dietary Approaches to Stop Hypertension." The DASH eating plan is a healthy eating plan that has been shown to reduce high blood pressure (hypertension). Additional health benefits may include reducing the risk of type 2 diabetes mellitus, heart disease, and  stroke. The DASH eating plan may also help with weight loss. WHAT DO I NEED TO KNOW ABOUT THE DASH EATING PLAN? For the DASH eating plan, you will follow these general guidelines:  Choose foods with a percent daily value for sodium of less than 5% (as listed on the food label).  Use salt-free seasonings or herbs instead of table salt or sea salt.  Check with your health care provider or pharmacist before using salt substitutes.  Eat lower-sodium products, often labeled as "lower sodium" or "no salt added."  Eat fresh foods.  Eat more vegetables, fruits, and low-fat dairy products.  Choose whole grains. Look for the word "whole" as the first word in the ingredient list.  Choose fish and skinless chicken or [place]  more often than red meat. Limit fish, poultry, and meat to 6 oz (170 g) each day.  Limit sweets, desserts, sugars, and sugary drinks.  Choose heart-healthy fats.  Limit cheese to 1 oz (28 g) per day.  Eat more home-cooked food and less restaurant, buffet, and fast food.  Limit fried foods.  Cook foods using methods other than frying.  Limit canned vegetables. If you do use them, rinse them well to decrease the sodium.  When eating at a restaurant, ask that your food be prepared with less salt, or no salt if possible. WHAT FOODS CAN I EAT? Seek help from a dietitian for individual calorie needs. Grains Whole grain or whole wheat bread. Brown rice. Whole grain or whole wheat pasta. Quinoa, bulgur, and whole grain cereals. Low-sodium cereals. Corn or whole wheat flour tortillas. Whole grain cornbread. Whole grain crackers. Low-sodium crackers. Vegetables Fresh or frozen vegetables (raw, steamed, roasted, or grilled). Low-sodium or reduced-sodium tomato and vegetable juices. Low-sodium or reduced-sodium tomato sauce and paste. Low-sodium or reduced-sodium canned vegetables.  Fruits All fresh, canned (in natural juice), or frozen fruits. Meat and Other Protein  Products Ground beef (85% or leaner), grass-fed beef, or beef trimmed of fat. Skinless chicken or [place]. Ground chicken or [place]. Pork trimmed of fat. All fish and seafood. Eggs. Dried beans, peas, or lentils. Unsalted nuts and seeds. Unsalted canned beans. Dairy Low-fat dairy products, such as skim or 1% milk, 2% or reduced-fat cheeses, low-fat ricotta or cottage cheese, or plain low-fat yogurt. Low-sodium or reduced-sodium cheeses. Fats and Oils Tub margarines without trans fats. Light or reduced-fat mayonnaise and salad dressings (reduced sodium). Avocado. Safflower, olive, or canola oils. Natural peanut or almond butter. Other Unsalted popcorn and pretzels. The items listed above may not be a complete list of recommended foods or beverages. Contact your dietitian for more options. WHAT FOODS ARE NOT RECOMMENDED? Grains White bread. White pasta. White rice. Refined cornbread. Bagels and croissants. Crackers that contain trans fat. Vegetables Creamed or fried vegetables. Vegetables in a cheese sauce. Regular canned vegetables. Regular canned tomato sauce and paste. Regular tomato and vegetable juices. Fruits Dried fruits. Canned fruit in light or heavy syrup. Fruit juice. Meat and Other Protein Products Fatty cuts of meat. Ribs, chicken wings, bacon, sausage, bologna, salami, chitterlings, fatback, hot dogs, bratwurst, and packaged luncheon meats. Salted nuts and seeds. Canned beans with salt. Dairy Whole or 2% milk, cream, half-and-half, and cream cheese. Whole-fat or sweetened yogurt. Full-fat cheeses or blue cheese. Nondairy creamers and whipped toppings. Processed cheese, cheese spreads, or cheese curds. Condiments Onion and garlic salt, seasoned salt, table salt, and sea salt. Canned and packaged gravies. Worcestershire sauce. Tartar sauce. Barbecue sauce. Teriyaki sauce. Soy sauce, including reduced sodium. Steak sauce. Fish sauce. Oyster sauce. Cocktail sauce. Horseradish. Ketchup and  mustard. Meat flavorings and tenderizers. Bouillon cubes. Hot sauce. Tabasco sauce. Marinades. Taco seasonings. Relishes. Fats and Oils Butter, stick margarine, lard, shortening, ghee, and bacon fat. Coconut, palm kernel, or palm oils. Regular salad dressings. Other Pickles and olives. Salted popcorn and pretzels. The items listed above may not be a complete list of foods and beverages to avoid. Contact your dietitian for more information. WHERE CAN I FIND MORE INFORMATION? [employer], [employer], [employer]: travelstabloid.com Document Released: 01/31/2011 Document Revised: 06/28/2013 Document Reviewed: 12/16/2012 [facility] Patient Information 2015 [place], [place]. This information is not intended to replace advice given to you by your health care provider. Make sure you discuss any questions you have with  your health care provider.

## 2013-10-29 ENCOUNTER — Ambulatory Visit (INDEPENDENT_AMBULATORY_CARE_PROVIDER_SITE_OTHER): Payer: Medicare Other | Admitting: Internal Medicine

## 2013-10-29 ENCOUNTER — Encounter: Payer: Self-pay | Admitting: Internal Medicine

## 2013-10-29 VITALS — BP 100/60 | HR 85 | Temp 98.0°F

## 2013-10-29 DIAGNOSIS — J9611 Chronic respiratory failure with hypoxia: Secondary | ICD-10-CM

## 2013-10-29 DIAGNOSIS — J449 Chronic obstructive pulmonary disease, unspecified: Secondary | ICD-10-CM

## 2013-10-29 DIAGNOSIS — R0902 Hypoxemia: Secondary | ICD-10-CM

## 2013-10-29 DIAGNOSIS — J9610 Chronic respiratory failure, unspecified whether with hypoxia or hypercapnia: Secondary | ICD-10-CM

## 2013-10-29 MED ORDER — BUDESONIDE 0.25 MG/2ML IN SUSP: RESPIRATORY_TRACT | Status: DC

## 2013-10-29 MED ORDER — FORMOTEROL FUMARATE 20 MCG/2ML IN NEBU: 20.0000 ug | INHALATION_SOLUTION | Freq: Two times a day (BID) | RESPIRATORY_TRACT | Status: DC

## 2013-10-29 NOTE — Progress Notes (Signed)
Subjective:    Patient ID: [name] [name], female    DOB: 1940/04/17    MRN: [id number]  Brief patient profile:  9 yowf quit smoking  04/2011 dx'd with stage III COPD in 2004.  HPI 07/18/2011 f/u ov/Wert cc w/c bound due to weakness and 02 3lpm 24 hours per day. No cough, confused with meds/ names (only used saba so far on day of ov, not yet gotten around to symbicort) but seems to be getting along ok at this point.   rec Symbicort Take 2 puffs first thing in am and then another 2 puffs about 12 hours later.  Work on inhaler technique:   .    Only use your albuterol as a rescue medication   05/26/2012 f/u ov/Wert cc Chief Complaint  Patient presents with  . COPD    Breathing is unchanged.   doe x room to room even on 4lpm and self titrating 02 at other times rec No change rx Use med calendar      08/24/2012 f/u ov/Wert re copd/ using med calendar better in terms of maint but not prns Chief Complaint  Patient presents with  . 3 month follow up    increased SOB all the time - worsened over the past 2 wks.  Also has wheezing, chest tightness/chest pain, and prod cough with white mucus.    no def change in pain or cough, just the breathing, was fine on 6/27, much worse since assoc with hoarseness and ? Overt HB  >PPI and pepcid   09/07/2012 Follow up and Med review  Returns for follow up and med review .  - pt brought all meds with her today.  We reviewed all her medications and organized them into a medication calendar with patient education. Patient continues to complain of difficulty using her Symbicort inhaler. We discussed switching Symbicort over to, nebulized medications. Patient also had difficulty with Pepcid and has responded to switch over to Zantac with improvement. Patient denies any hemoptysis, orthopnea, PND, or leg swelling. Rec Follow med calendar closely and bring to each visit.  Switch Symbicort to Budesonide/Brovana Neb Twice daily    12/14/2012 f/u ov/Wert re:  copd GOLD III with reversibility/ chronic 02 dep Chief Complaint  Patient presents with  . Follow-up    PFT done today.  Still having SOB    Not following med calendar, no better on "the last neb you gave me" so back on symbicort, Not using 02 as rec Sob x 50 ft on 2lpm (was supposed to be on 3lpm per med calendar >Increase 02 to 4lpm with exertion but ok to use 2lpm at rest and sleeping   01/01/2013 Follow up and Med Review  Hx of COPD GOLD III with reversibility/ chronic 02 dep. Returns for follow up and med review .  Pt brought all meds with her today.  We reviewed all her medications and organized them into a medication calendar with patient education. Having more trouble with portable concentrator not charging.  Feels breathing is doing better on Symbicort.  We discussed pulmonary rehab, she agreed to referral.  No chest pain , orthopnea, edema, abd pain , n/v/d.  >>no changes   05/10/2013 Follow up  Returns for a  4 month follow up COPD -  Reports having increased SOB, occ chest tightness x2 months with prod cough with thick white mucus.  These symptoms wax and wane. Over all feels about the same with no sign changes in her baseline  Discussed  pulmonary rehab referral and she is open to this.  No chest pain, orthopnea, edema or fever.  >>no changes   09/07/2013 Follow up COPD  MW pt here for 4 month COPD follow up.  Reports increased SOB x2-31months with wheezing, tightness, prod cough with white mucus, nausea.   Worse for last 1 week.  Denies f/c/s/v/d, hemoptysis, PND, leg swelling. On Symbicort and Atrovent Neb Four times a day   On O2 at 4l/m .  rec Doxycycline 100mg  Twice daily  For 7 days  Prednisone taper over next week.  Mucinex DM Twice daily  As needed  Cough/congestion    Admission date: 10/20/2013   Discharge Date: 10/22/2013  Discharge Diagnosis  :  Acute on chronic respiratory failure  COPD GOLD III 02 dep  CAD  Acute exacerbation of chronic obstructive  pulmonary disease (COPD)  Anxiety disorder  History of ventricular tachycardia  History of present illness and Hospital Course:  Kindly see H&P for history of present illness and admission details, please review complete Labs, Consult reports and Test reports for all details in brief  HPI from the history and physical done on the day of admission  [name] [name] is a 73 y.o. female with a past medical history of COPD, coronary artery disease on medical management, history of ventricular tachycardia on amiodarone, who was in her usual state of health till about 2-3 days pta, when she started noticing that her breathing was getting worse. This was mainly with exertion. She has chronic shortness of breath, but this has been getting worse. She denies any cough, no fever. Has been taking her medications as prescribed. Denies any chest pain. She had noticed that when she would exert she would feel like she was going to pass out, but denies any syncopal episode. Has had some nausea, chronically, but denies any vomiting. Denies any abdominal pain. No recent travel anywhere. She is on home oxygen at 4 L per minute. Denies any leg swelling. No history suggestive of orthopnea or PND.  Hospital Course  #1 Acute on chronic hypoxic respiratory failure with acute COPD exacerbation: Has chronic respiratory failure with advanced underlying COPD, uses 4lit/min nasal cannula oxygen at home. Prudent at baseline after IV Solu-Medrol, schedule an as needed nebulizer treatments along with supplemental oxygen and oral doxycycline. No pulse ox 100% on 4 L nasal cannula and she is symptom-free he could go home, will be discharged home on oral steroid taper and 5 more days of oral doxycycline with outpatient followup with PCP and her pulmonologist.  #2. Abnormal EKG: Right bundle branch block with some intraventricular conduction delay, she was completely symptom-free, these changes are chronic although slightly more pronounced  initially during admission. Negative Troponin x3 sets. She had a cardiac catheterization in 2012, which revealed high-grade stenosis of the LAD, and high grade calcified nodule involving the proximal circumflex artery. She was not considered a good candidate for intervention at that time. She is symptom-free and chest pain-free continue medical management with aspirin, Plavix, statin, Imdur, Cardizem. She's not on beta blocker due to advanced COPD. have requested her to follow with her primary cardiologist post discharge. She is completely symptom-free.  #3 History of coronary artery disease, on medical management: Continue with her home medications as in #2 above, She's not on beta blocker, presumably due to her severe lung disease.  #4 History of ventricular tachycardia: Continue with amiodarone. Monitor on telemetry, last EF 60% on echogram and 2013.    10/29/2013 post hosp  f/u ov/Wert re: weak p admit / 02 3lpm  Chief Complaint  Patient presents with  . HFU  6 x 5mg  prednisone this am tapering off "always seems to help but wears off after a week.  No purulent sputum continues with congested cough ? Has flutter  On symbicort but hfa very poor, has neb. No sob at rest or noct symptoms  No obvious day to day or daytime variabilty or assoc chronic cough or cp or chest tightness, subjective wheeze overt sinus or hb symptoms. No unusual exp hx or h/o childhood pna/ asthma or knowledge of premature birth.  Sleeping ok without nocturnal  or early am exacerbation  of respiratory  c/o's or need for noct saba. Also denies any obvious fluctuation of symptoms with weather or environmental changes or other aggravating or alleviating factors except as outlined above   Current Medications, Allergies, Complete Past Medical History, Past Surgical History, Family History, and Social History were reviewed in [employer] record.  ROS  The following are not active complaints unless  bolded sore throat, dysphagia, dental problems, itching, sneezing,  nasal congestion or excess/ purulent secretions, ear ache,   fever, chills, sweats, unintended wt loss, pleuritic or exertional cp, hemoptysis,  orthopnea pnd or leg swelling, presyncope, palpitations, heartburn, abdominal pain, anorexia, nausea, vomiting, diarrhea  or change in bowel or urinary habits, change in stools or urine, dysuria,hematuria,  rash, arthralgias, visual complaints, headache, numbness weakness or ataxia or problems with walking or coordination,  change in mood/affect or memory.                    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)  HYPERLIPIDEMIA (ICD-272.4)  HYPERTENSION (ICD-401.9)  CEREBROVASCULAR DISEASE (ICD-437.9)  - 80% R Carotid stenosis - see note by Bradham11/16/11  GLAUCOMA (ICD-365.9)  OBESITY (ICD-278.00)  COPD (ICD-496)  - PFT's 12/10/2002 FEV1 .85(37%) ratio 47 and 11 % better p B2, DLC0 42%  - Walking sats January 25, 2010 > desat even on 2lpm > rec 2.5 lpm 24 h per day  -Med Calendar 01/01/2013       Objective:   Physical Exam    GEN: elderly w/c bound, chronically ill appearing    Wt Readings from Last 3 Encounters:  10/21/13 176 lb 5.9 oz (80 kg)  09/07/13 176 lb 6.4 oz (80.015 kg)  07/09/13 178 lb (80.74 kg)      HEENT mild turbinate edema.  Oropharynx no thrush or excess pnd or cobblestoning.  No JVD or cervical adenopathy. Mild accessory muscle hypertrophy. Trachea midline, nl thryroid. Chest was hyperinflated by percussion with diminished breath sounds and moderate increased exp time without wheeze. Hoover sign positive at mid inspiration. Regular rate and rhythm without murmur gallop or rub or increase P2 or edema.  Abd: no hsm, nl excursion. Ext warm without cyanosis or clubbing. Skin with classic steroid ecchymoses both UEs           cxr 09/07/13 Emphysema. Stable basilar scarring and mild cardiomegaly.  10/20/13  pCXR No acute  findings    Chemistry      Component Value Date/Time   NA 142 10/21/2013 0630   K 4.3 10/21/2013 0630   CL 97 10/21/2013 0630   CO2 26 10/21/2013 0630   BUN 13 10/21/2013 0630   CREATININE 0.61 10/21/2013 0630   CREATININE 0.63 08/01/2011 1554      Component Value Date/Time   CALCIUM 9.1 10/21/2013 0630   ALKPHOS 74 10/21/2013 0630  AST 34 10/21/2013 0630   ALT 26 10/21/2013 0630   BILITOT 0.3 10/21/2013 0630        Lab Results  Component Value Date   WBC 10.3 10/21/2013   HGB 10.0* 10/21/2013   HCT 33.0* 10/21/2013   MCV 85.9 10/21/2013   PLT 247 10/21/2013   Lab Results  Component Value Date   PROBNP 17.0 01/31/2012           Assessment & Plan:

## 2013-10-29 NOTE — Patient Instructions (Addendum)
Continue to taper off prednisone   Try to substitute your symbiocort with a nebulizer form = performist and budesonide every 12 hours  Please see patient coordinator before you leave today  to schedule delivery of your nebulizer meds through medicare part B   See Tammy NP in 3  weeks with all your medications, even over the counter meds, separated in two separate bags, the ones you take no matter what vs the ones you stop once you feel better and take only as needed when you feel you need them.   Tammy  will generate for you a new user friendly medication calendar that will put [place] all on the same page re: your medication use.     Without this process, it simply isn't possible to assure that we are providing  your outpatient care  with  the attention to detail we feel you deserve.   If we cannot assure that you're getting that kind of care,  then we cannot manage your problem effectively from this clinic.  Once you have seen Tammy and we are sure that we're all on the same page with your medication use she will arrange follow up with me.

## 2013-10-30 NOTE — Assessment & Plan Note (Signed)
-  Walking sats January 25, 2010 > desat even on 2lpm   -10/25/2011  Sat 90% at rest >  Walked 2lpm x one lap @ 185 stopped due to  Sob/ fatigue but no desat so changed to ok to leave off at rest -05/26/2012   Walked 4lp  x one half lap = 90 ft,  stopped due to  Fatigue, no desat  -12/14/2012   Walked 2lpm x one lap @ 185 stopped due to  desat  To 85%  rec 2lpm at rest, 4lpm with exertion as of 12/14/12 > ok to titrate x at hs as of 10/29/13 to goal of > 90%

## 2013-10-30 NOTE — Assessment & Plan Note (Signed)
-   PFT's 12/10/2002 FEV1 .85(37%) ratio 47 and 11 % better p B2, DLC0 42%  - PFTs 12/14/2012  FEV1 0.49 (21%) improves by 31%  - Rehab rec 05/28/12 > pt declined - HFA 50% p coaching 08/24/2012  - med calendar 02/10/12 , 09/07/2012 , 01/01/2013 -changed symbicort to neb B/B  09/07/12 but worse so back to symbicort > retried 10/30/2013 as very poor hfa  -refer pulmonary rehab  05/10/2013 > pt terminated 08/02/13 as did not appear to be motivated per staff  DDX of  difficult airways management all start with A and  include Adherence, Ace Inhibitors, Acid Reflux, Active Sinus Disease, Alpha 1 Antitripsin deficiency, Anxiety masquerading as Airways dz,  ABPA,  allergy(esp in young), Aspiration (esp in elderly), Adverse effects of DPI,  Active smokers, plus two Bs  = Bronchiectasis and Beta blocker use..and one C= CHF  Adherence is always the initial "prime suspect" and is a multilayered concern that requires a "trust but verify" approach in every patient - starting with knowing how to use medications, especially inhalers, correctly, keeping up with refills and understanding the fundamental difference between maintenance and prns vs those medications only taken for a very short course and then stopped and not refilled.  - not sure she's really using med calendar to full advantage, reviewed line by line again - The proper method of use, as well as anticipated side effects, of a metered-dose inhaler are discussed and demonstrated to the patient. Improved effectiveness after extensive coaching during this visit to a level of approximately  25% from a baseline of zero > this is likely why she does better on pred, worse off it > rechallange with perforomist/ budesonide

## 2013-11-02 ENCOUNTER — Inpatient Hospital Stay: Payer: Medicare Other | Admitting: Internal Medicine

## 2013-11-02 ENCOUNTER — Telehealth: Payer: Self-pay | Admitting: Internal Medicine

## 2013-11-02 NOTE — Telephone Encounter (Signed)
Yes as long as use them all right away after mixing

## 2013-11-02 NOTE — Telephone Encounter (Signed)
Called spoke with pt. Aware of recs. Nothing further eneded

## 2013-11-02 NOTE — Telephone Encounter (Signed)
Patient Instructions  10/29/13     Continue to taper off prednisone  Try to substitute your symbiocort with a nebulizer form = performist and budesonide every 12 hours..........   Pt is aware of rec's as above to substitute Performist and Budesonide BID for the Symbicort. Aware that these two neb meds can be mixed.  Pt wanting to know if she can mix her Atrovent with these medications when she takes it or if this needs to remain separate. Pt prescribed Atrovent to use every 6 hours PRN.   Please advise Dr Melvyn Novas. Thanks.

## 2013-11-11 ENCOUNTER — Emergency Department (HOSPITAL_COMMUNITY): Payer: Medicare Other

## 2013-11-11 ENCOUNTER — Emergency Department (HOSPITAL_COMMUNITY)
Admission: EM | Admit: 2013-11-11 | Discharge: 2013-11-11 | Disposition: A | Discharge status: Home / Self Care | Payer: Medicare Other | Attending: Emergency Medicine | Admitting: Emergency Medicine

## 2013-11-11 ENCOUNTER — Ambulatory Visit: Payer: Medicare Other | Admitting: Cardiology

## 2013-11-11 ENCOUNTER — Encounter (HOSPITAL_COMMUNITY): Payer: Self-pay | Admitting: Emergency Medicine

## 2013-11-11 DIAGNOSIS — IMO0002 Reserved for concepts with insufficient information to code with codable children: Secondary | ICD-10-CM | POA: Diagnosis not present

## 2013-11-11 DIAGNOSIS — Z8701 Personal history of pneumonia (recurrent): Secondary | ICD-10-CM | POA: Insufficient documentation

## 2013-11-11 DIAGNOSIS — I2510 Atherosclerotic heart disease of native coronary artery without angina pectoris: Secondary | ICD-10-CM | POA: Insufficient documentation

## 2013-11-11 DIAGNOSIS — E785 Hyperlipidemia, unspecified: Secondary | ICD-10-CM | POA: Insufficient documentation

## 2013-11-11 DIAGNOSIS — I10 Essential (primary) hypertension: Secondary | ICD-10-CM | POA: Diagnosis not present

## 2013-11-11 DIAGNOSIS — Z862 Personal history of diseases of the blood and blood-forming organs and certain disorders involving the immune mechanism: Secondary | ICD-10-CM | POA: Insufficient documentation

## 2013-11-11 DIAGNOSIS — Z8669 Personal history of other diseases of the nervous system and sense organs: Secondary | ICD-10-CM | POA: Insufficient documentation

## 2013-11-11 DIAGNOSIS — Z87891 Personal history of nicotine dependence: Secondary | ICD-10-CM | POA: Diagnosis not present

## 2013-11-11 DIAGNOSIS — E669 Obesity, unspecified: Secondary | ICD-10-CM | POA: Diagnosis not present

## 2013-11-11 DIAGNOSIS — Z8551 Personal history of malignant neoplasm of bladder: Secondary | ICD-10-CM | POA: Diagnosis not present

## 2013-11-11 DIAGNOSIS — R5383 Other fatigue: Secondary | ICD-10-CM

## 2013-11-11 DIAGNOSIS — J441 Chronic obstructive pulmonary disease with (acute) exacerbation: Secondary | ICD-10-CM | POA: Insufficient documentation

## 2013-11-11 DIAGNOSIS — R0602 Shortness of breath: Secondary | ICD-10-CM | POA: Diagnosis present

## 2013-11-11 DIAGNOSIS — Z9981 Dependence on supplemental oxygen: Secondary | ICD-10-CM | POA: Diagnosis not present

## 2013-11-11 DIAGNOSIS — M129 Arthropathy, unspecified: Secondary | ICD-10-CM | POA: Insufficient documentation

## 2013-11-11 DIAGNOSIS — K219 Gastro-esophageal reflux disease without esophagitis: Secondary | ICD-10-CM | POA: Insufficient documentation

## 2013-11-11 DIAGNOSIS — Z79899 Other long term (current) drug therapy: Secondary | ICD-10-CM | POA: Diagnosis not present

## 2013-11-11 DIAGNOSIS — R5381 Other malaise: Secondary | ICD-10-CM | POA: Insufficient documentation

## 2013-11-11 DIAGNOSIS — Z9889 Other specified postprocedural states: Secondary | ICD-10-CM | POA: Insufficient documentation

## 2013-11-11 DIAGNOSIS — Z7902 Long term (current) use of antithrombotics/antiplatelets: Secondary | ICD-10-CM | POA: Diagnosis not present

## 2013-11-11 DIAGNOSIS — J449 Chronic obstructive pulmonary disease, unspecified: Secondary | ICD-10-CM

## 2013-11-11 DIAGNOSIS — I509 Heart failure, unspecified: Secondary | ICD-10-CM | POA: Diagnosis not present

## 2013-11-11 LAB — I-STAT TROPONIN, ED: Troponin i, poc: 0.01 ng/mL (ref 0.00–0.08)

## 2013-11-11 LAB — COMPREHENSIVE METABOLIC PANEL
ALT: 29 U/L (ref 0–35)
ANION GAP: 13 (ref 5–15)
AST: 22 U/L (ref 0–37)
Albumin: 2.7 g/dL — ABNORMAL LOW (ref 3.5–5.2)
Alkaline Phosphatase: 87 U/L (ref 39–117)
BILIRUBIN TOTAL: 0.3 mg/dL (ref 0.3–1.2)
BUN: 14 mg/dL (ref 6–23)
CO2: 31 mEq/L (ref 19–32)
CREATININE: 0.59 mg/dL (ref 0.50–1.10)
Calcium: 8.9 mg/dL (ref 8.4–10.5)
Chloride: 100 mEq/L (ref 96–112)
GFR calc Af Amer: >90 mL/min (ref >90)
GFR calc non Af Amer: 89 mL/min — ABNORMAL LOW (ref >90)
Glucose, Bld: 120 mg/dL — ABNORMAL HIGH (ref 70–99)
Potassium: 3.7 mEq/L (ref 3.7–5.3)
Sodium: 144 mEq/L (ref 137–147)
TOTAL PROTEIN: 6.3 g/dL (ref 6.0–8.3)

## 2013-11-11 LAB — CBC WITH DIFFERENTIAL/PLATELET
BASOS ABS: 0 10*3/uL (ref 0.0–0.1)
BASOS PCT: 0 % (ref 0–1)
EOS ABS: 0 10*3/uL (ref 0.0–0.7)
Eosinophils Relative: 0 % (ref 0–5)
HEMATOCRIT: 35.5 % — AB (ref 36.0–46.0)
Hemoglobin: 10.6 g/dL — ABNORMAL LOW (ref 12.0–15.0)
Lymphocytes Relative: 4 % — ABNORMAL LOW (ref 12–46)
Lymphs Abs: 0.4 10*3/uL — ABNORMAL LOW (ref 0.7–4.0)
MCH: 25.5 pg — AB (ref 26.0–34.0)
MCHC: 29.9 g/dL — AB (ref 30.0–36.0)
MCV: 85.5 fL (ref 78.0–100.0)
MONO ABS: 0.4 10*3/uL (ref 0.1–1.0)
MONOS PCT: 4 % (ref 3–12)
NEUTROS PCT: 92 % — AB (ref 43–77)
Neutro Abs: 9.8 10*3/uL — ABNORMAL HIGH (ref 1.7–7.7)
Platelets: 183 10*3/uL (ref 150–400)
RBC: 4.15 MIL/uL (ref 3.87–5.11)
RDW: 18.7 % — ABNORMAL HIGH (ref 11.5–15.5)
WBC: 10.7 10*3/uL — ABNORMAL HIGH (ref 4.0–10.5)

## 2013-11-11 LAB — URINALYSIS, ROUTINE W REFLEX MICROSCOPIC
Glucose, UA: NEGATIVE mg/dL
Hgb urine dipstick: NEGATIVE
Ketones, ur: NEGATIVE mg/dL
LEUKOCYTES UA: NEGATIVE
Nitrite: NEGATIVE
PH: 5.5 (ref 5.0–8.0)
Protein, ur: NEGATIVE mg/dL
Specific Gravity, Urine: 1.025 (ref 1.005–1.030)
UROBILINOGEN UA: 0.2 mg/dL (ref 0.0–1.0)

## 2013-11-11 LAB — I-STAT VENOUS BLOOD GAS, ED
ACID-BASE EXCESS: 9 mmol/L — AB (ref 0.0–2.0)
BICARBONATE: 36.9 meq/L — AB (ref 20.0–24.0)
O2 Saturation: 55 %
PH VEN: 7.337 — AB (ref 7.250–7.300)
TCO2: 39 mmol/L (ref 0–100)
pCO2, Ven: 68.9 mmHg — ABNORMAL HIGH (ref 45.0–50.0)
pO2, Ven: 32 mmHg (ref 30.0–45.0)

## 2013-11-11 LAB — PRO B NATRIURETIC PEPTIDE: PRO B NATRI PEPTIDE: 671.3 pg/mL — AB (ref 0–125)

## 2013-11-11 MED ORDER — LORAZEPAM 1 MG PO TABS
0.5000 mg | ORAL_TABLET | Freq: Once | ORAL | Status: AC
  Administered 2013-11-11: 0.5 mg via ORAL
  Filled 2013-11-11: qty 1

## 2013-11-11 MED ORDER — IPRATROPIUM BROMIDE 0.02 % IN SOLN
0.5000 mg | Freq: Once | RESPIRATORY_TRACT | Status: AC
Start: 2013-11-11 — End: 2013-11-11
  Administered 2013-11-11: 0.5 mg via RESPIRATORY_TRACT
  Filled 2013-11-11: qty 2.5

## 2013-11-11 MED ORDER — METHYLPREDNISOLONE SODIUM SUCC 125 MG IJ SOLR
125.0000 mg | Freq: Once | INTRAMUSCULAR | Status: AC
  Administered 2013-11-11: 125 mg via INTRAVENOUS
  Filled 2013-11-11: qty 2

## 2013-11-11 MED ORDER — ACETAMINOPHEN 325 MG PO TABS
650.0000 mg | ORAL_TABLET | Freq: Once | ORAL | Status: AC
  Administered 2013-11-11: 650 mg via ORAL
  Filled 2013-11-11: qty 2

## 2013-11-11 MED ORDER — LEVALBUTEROL HCL 1.25 MG/0.5ML IN NEBU
1.2500 mg | INHALATION_SOLUTION | Freq: Once | RESPIRATORY_TRACT | Status: AC
  Administered 2013-11-11: 1.25 mg via RESPIRATORY_TRACT
  Filled 2013-11-11: qty 0.5

## 2013-11-11 MED ORDER — SODIUM CHLORIDE 0.9 % IV BOLUS (SEPSIS)
500.0000 mL | Freq: Once | INTRAVENOUS | Status: AC
  Administered 2013-11-11: 500 mL via INTRAVENOUS

## 2013-11-11 MED ORDER — LORAZEPAM 1 MG PO TABS: 1.0000 mg | ORAL_TABLET | Freq: Once | ORAL | Status: DC

## 2013-11-11 MED ORDER — IPRATROPIUM BROMIDE 0.02 % IN SOLN
0.5000 mg | Freq: Once | RESPIRATORY_TRACT | Status: AC
  Administered 2013-11-11: 0.5 mg via RESPIRATORY_TRACT
  Filled 2013-11-11: qty 2.5

## 2013-11-11 NOTE — ED Provider Notes (Signed)
Medical screening examination/treatment/procedure(s) were conducted as a shared visit with non-physician practitioner(s) and myself.  I personally evaluated the patient during the encounter.   EKG Interpretation   Date/Time:  Thursday November 11 2013 14:19:53 EDT Ventricular Rate:  90 PR Interval:  178 QRS Duration: 150 QT Interval:  442 QTC Calculation: 541 R Axis:   77 Text Interpretation:  Sinus rhythm Right bundle branch block No  significant change since last tracing Confirmed by Melayna Robarts,  DO, Jaclynn Laumann  [phone]) on 11/11/2013 5:02:45 PM      Pt is a 73 y.o. F with history of CAD on Plavix, CHF, COPD on 4 L of oxygen at home who presents with shortness of breath today. Suspect COPD exacerbation. She does have some mild interstitial edema on her chest x-ray. No other signs of volume overload on exam. ABG shows hypercarbia but this is patient's baseline and she is well compensated. Troponin negative. Chest x-ray shows no infiltrate. Patient reports feeling better after breathing treatments and steroids. She would like discharge home.  [place], DO 11/11/13 2318

## 2013-11-11 NOTE — Discharge Instructions (Signed)
Continue all your nebulized treatments. Take your klonipin as prescribed by your doctor. Do not use long tubing for your oxygen. Follow up with your doctor as soon as able. Return if worsening.   Chronic Obstructive Pulmonary Disease Chronic obstructive pulmonary disease (COPD) is a common lung condition in which airflow from the lungs is limited. COPD is a general term that can be used to describe many different lung problems that limit airflow, including both chronic bronchitis and emphysema. If you have COPD, your lung function will probably never return to normal, but there are measures you can take to improve lung function and make yourself feel better.  CAUSES   Smoking (common).   Exposure to secondhand smoke.   Genetic problems.  Chronic inflammatory lung diseases or recurrent infections. SYMPTOMS   Shortness of breath, especially with physical activity.   Deep, persistent (chronic) cough with a large amount of thick mucus.   Wheezing.   Rapid breaths (tachypnea).   Gray or bluish discoloration (cyanosis) of the skin, especially in fingers, toes, or lips.   Fatigue.   Weight loss.   Frequent infections or episodes when breathing symptoms become much worse (exacerbations).   Chest tightness. DIAGNOSIS  Your health care provider will take a medical history and perform a physical examination to make the initial diagnosis. Additional tests for COPD may include:   Lung (pulmonary) function tests.  Chest X-ray.  CT scan.  Blood tests. TREATMENT  Treatment available to help you feel better when you have COPD includes:   Inhaler and nebulizer medicines. These help manage the symptoms of COPD and make your breathing more comfortable.  Supplemental oxygen. Supplemental oxygen is only helpful if you have a low oxygen level in your blood.   Exercise and physical activity. These are beneficial for nearly all people with COPD. Some people may also benefit from  a pulmonary rehabilitation program. HOME CARE INSTRUCTIONS   Take all medicines (inhaled or pills) as directed by your health care provider.  Avoid over-the-counter medicines or cough syrups that dry up your airway (such as antihistamines) and slow down the elimination of secretions unless instructed otherwise by your health care provider.   If you are a smoker, the most important thing that you can do is stop smoking. Continuing to smoke will cause further lung damage and breathing trouble. Ask your health care provider for help with quitting smoking. He or she can direct you to community resources or hospitals that provide support.  Avoid exposure to irritants such as smoke, chemicals, and fumes that aggravate your breathing.  Use oxygen therapy and pulmonary rehabilitation if directed by your health care provider. If you require home oxygen therapy, ask your health care provider whether you should purchase a pulse oximeter to measure your oxygen level at home.   Avoid contact with individuals who have a contagious illness.  Avoid extreme temperature and humidity changes.  Eat healthy foods. Eating smaller, more frequent meals and resting before meals may help you maintain your strength.  Stay active, but balance activity with periods of rest. Exercise and physical activity will help you maintain your ability to do things you want to do.  Preventing infection and hospitalization is very important when you have COPD. Make sure to receive all the vaccines your health care provider recommends, especially the pneumococcal and influenza vaccines. Ask your health care provider whether you need a pneumonia vaccine.  Learn and use relaxation techniques to manage stress.  Learn and use controlled breathing techniques  as directed by your health care provider. Controlled breathing techniques include:   Pursed lip breathing. Start by breathing in (inhaling) through your nose for 1 second. Then,  purse your lips as if you were going to whistle and breathe out (exhale) through the pursed lips for 2 seconds.   Diaphragmatic breathing. Start by putting one hand on your abdomen just above your waist. Inhale slowly through your nose. The hand on your abdomen should move out. Then purse your lips and exhale slowly. You should be able to feel the hand on your abdomen moving in as you exhale.   Learn and use controlled coughing to clear mucus from your lungs. Controlled coughing is a series of short, progressive coughs. The steps of controlled coughing are:  1. Lean your head slightly forward.  2. Breathe in deeply using diaphragmatic breathing.  3. Try to hold your breath for 3 seconds.  4. Keep your mouth slightly open while coughing twice.  5. Spit any mucus out into a tissue.  6. Rest and repeat the steps once or twice as needed. SEEK MEDICAL CARE IF:   You are coughing up more mucus than usual.   There is a change in the color or thickness of your mucus.   Your breathing is more labored than usual.   Your breathing is faster than usual.  SEEK IMMEDIATE MEDICAL CARE IF:   You have shortness of breath while you are resting.   You have shortness of breath that prevents you from:  Being able to talk.   Performing your usual physical activities.   You have chest pain lasting longer than 5 minutes.   Your skin color is more cyanotic than usual.  You measure low oxygen saturations for longer than 5 minutes with a pulse oximeter. MAKE SURE YOU:   Understand these instructions.  Will watch your condition.  Will get help right away if you are not doing well or get worse. Document Released: 11/21/2004 Document Revised: 06/28/2013 Document Reviewed: 10/08/2012 [facility] Patient Information 2015 [place], [place]. This information is not intended to replace advice given to you by your health care provider. Make sure you discuss any questions you have with your health  care provider.

## 2013-11-11 NOTE — ED Notes (Signed)
Pt ambulated a few steps with assistance and 02. Pt needed to sit- felt weak and needed to sit down. Pt saying she was so SOB

## 2013-11-11 NOTE — ED Provider Notes (Signed)
CSN: [id number]     Arrival date & time 11/11/13  1409 History   First MD Initiated Contact with Patient 11/11/13 1506     Chief Complaint  Patient presents with  . Shortness of Breath     (Consider location/radiation/quality/duration/timing/severity/associated sxs/prior Treatment) HPI [name] [name] is a 73 y.o. female who presents to ED with complaint of shortness of breath. Pt with hx of COPD, CAD, CHF with recent admission last month for same symptoms, with diagnosis of acute respiratory failure, most likely thought to be due to her COPD. Pt on home oxygen 4L at all times, followed by Dr. Melvyn Novas. Pt states in the last two days, symptoms worsened. At baseline patient is able to get around the house, clean, cook, but states the last 2 days she has difficult time even getting up to go to the bathroom. Shortness of breath is worsened with activity. According to her family member, concerned about the length of her oxygen tubing. She states her tube extends from her bedroom all the way to the living room where she spent most of her time. Patient states today she could not catch her breath the EMS was called. When EMS placed her on a short tubing and connected her to Dr. Evangeline Gula, patient felt and looked much better. Patient states she had recent change in her medications, states they've switched her to nebulized treatments instead of inhalers that she was on. Patient admits to feeling generalized weakness, states she feels like she may be dehydrated and is concerned about her potassium being low. She states she has history of the same in the past. Patient admits to compliance with all her medications. She states she uses Xopenex one time today with no relief of her symptoms. Patient states she's currently feeling better but continues to have some shortness of breath. Denies any chest pain. No cough. No fever, chills.  Past Medical History  Diagnosis Date  . Hypertension   . Hyperlipidemia   .  Cerebrovascular disease, unspecified   . Unspecified glaucoma   . Obesity, unspecified   . Coronary artery disease     cath 04/06/10: LAD occluded with R-L collats; med Rx WTC....probable V. Tach...med Rx  . Ventricular tachycardia   . SVT (supraventricular tachycardia)   . AAA (abdominal aortic aneurysm)   . COPD (chronic obstructive pulmonary disease) 2013    [place] of breath     WITH ACTIVITY--USES OXYGEN AS NEEDED  2&1/2 L PER NASAL CANNUL  . Complication of anesthesia     PT STATES SHE CAN NOT BE PUT TO SLEEP BECAUSE OF HER LUNG PROBLEMS  . Carotid artery occlusion   . Anemia   . CHF (congestive heart failure)   . Abdominal aneurysm     "hasn't been repaired" (10/20/2013)  . Pneumonia X ~ 2  . On home oxygen therapy     "4L; 24/7" (10/20/2013)  . GERD (gastroesophageal reflux disease)   . Migraines     "before tubal ligation I had them alot; seldom have one anymore" (10/20/2013)  . Arthritis     "knees mainly" (10/20/2013)  . Bladder cancer 2009   Past Surgical History  Procedure Laterality Date  . Cystoscopy    . Bladder tumor excision      resection of 5 bladder, and fulguration of 1 small bladder tumor  . Cystoscopy      cold cup bladder biopsy of five tumors, and fulguration of one tumor  . Wedge resection Right  1980's    Remote right lung  . Cystoscopy with biopsy  12/24/2011    Procedure: CYSTOSCOPY WITH BIOPSY;  Surgeon: Fredricka Bonine, MD;  Location: [facility];  Service: Urology;  Laterality: N/A;  . Tubal ligation Bilateral 1980's  . Cataract extraction w/ intraocular lens  implant, bilateral Bilateral ~ 2011  . Cardiac catheterization  "several"    "too bad to put stents in"   Family History  Problem Relation Age of Onset  . Heart disease Mother 27  . Heart disease Father   . Hyperlipidemia Father   . Hypertension Father   . Diabetes Daughter   . Peripheral vascular disease Daughter    History  Substance Use Topics  . Smoking  status: Former Smoker -- 2.00 packs/day for 50 years    Types: Cigarettes    Quit date: 05/18/2011  . Smokeless tobacco: Never Used  . Alcohol Use: No   OB History   Grav Para Term Preterm Abortions TAB SAB Ect Mult Living                 Review of Systems  Constitutional: Positive for fatigue. Negative for fever and chills.  Respiratory: Positive for shortness of breath. Negative for cough, chest tightness, wheezing and stridor.   Cardiovascular: Negative for chest pain, palpitations and leg swelling.  Gastrointestinal: Negative for nausea, vomiting, abdominal pain and diarrhea.  Genitourinary: Negative for dysuria, flank pain and pelvic pain.  Musculoskeletal: Negative for arthralgias, myalgias, neck pain and neck stiffness.  Skin: Negative for rash.  Neurological: Positive for weakness. Negative for dizziness and headaches.  All other systems reviewed and are negative.     Allergies  Albuterol; Levaquin; and Sulfa antibiotics  Home Medications   Prior to Admission medications   Medication Sig Start Date End Date Taking? Authorizing Provider  acetaminophen-codeine (TYLENOL #3) 300-30 MG per tablet Take 1 tablet by mouth every 8 (eight) hours as needed for moderate pain.     Historical Provider, MD  amiodarone (PACERONE) 200 MG tablet Take 200 mg by mouth at bedtime.  09/20/11   Lelon Perla, MD  aspirin EC 81 MG tablet Take 81 mg by mouth at bedtime.    Historical Provider, MD  Aspirin-Acetaminophen-Caffeine (GOODYS EXTRA STRENGTH) [phone] MG PACK Take 1 packet by mouth daily as needed (for pain).     Historical Provider, MD  budesonide (PULMICORT) 0.25 MG/2ML nebulizer solution 2 ml twice daily with perforomist 10/29/13   Tanda Rockers, MD  budesonide-formoterol [facility]) 160-4.5 MCG/ACT inhaler Inhale 2 puffs into the lungs 2 (two) times daily. 09/07/13   Tammy S Parrett, NP  clopidogrel (PLAVIX) 75 MG tablet Take 1 tablet (75 mg total) by mouth at bedtime. 12/26/11    Festus Aloe, MD  Dextromethorphan-Guaifenesin ([place] FAST-MAX DM MAX) 5-100 MG/5ML LIQD Take 5 mLs by mouth daily as needed (for congestion).    Historical Provider, MD  diltiazem (CARDIZEM CD) 300 MG 24 hr capsule Take 300 mg by mouth daily.    Historical Provider, MD  formoterol (PERFOROMIST) 20 MCG/2ML nebulizer solution Take 2 mLs (20 mcg total) by nebulization 2 (two) times daily. 10/29/13   Tanda Rockers, MD  furosemide (LASIX) 20 MG tablet Take 10 mg by mouth 2 (two) times daily.     Historical Provider, MD  ipratropium (ATROVENT) 0.02 % nebulizer solution Take 0.5 mg by nebulization every 6 (six) hours as needed for wheezing or shortness of breath.    Historical Provider, MD  isosorbide mononitrate (  IMDUR) 30 MG 24 hr tablet Take 30 mg by mouth daily.    Historical Provider, MD  levalbuterol [facility] HFA) 45 MCG/ACT inhaler Inhale 2 puffs into the lungs every 4 (four) hours as needed for wheezing. 09/07/13   Tammy S Parrett, NP  nitroGLYCERIN (NITROSTAT) 0.4 MG SL tablet Place 0.4 mg under the tongue every 5 (five) minutes as needed. Chest pain 07/10/10   Lelon Perla, MD  pantoprazole (PROTONIX) 40 MG tablet Take 40 mg by mouth daily.    Historical Provider, MD  potassium chloride SA (K-DUR,KLOR-CON) 20 MEQ tablet Take 20 mEq by mouth 2 (two) times daily.     Historical Provider, MD  pravastatin (PRAVACHOL) 80 MG tablet Take 80 mg by mouth at bedtime.    Historical Provider, MD  predniSONE (DELTASONE) 5 MG tablet Label  & dispense according to the schedule below. 10 Pills PO for 3 days then, 8 Pills PO for 3 days, 6 Pills PO for 3 days, 4 Pills PO for 3 days, 2 Pills PO for 3 days, 1 Pills PO for 3 days, 1/2 Pill  PO for 3 days then STOP. Total 95 pills. 10/22/13   Thurnell Lose, MD  promethazine (PHENERGAN) 25 MG tablet Take 25 mg by mouth every 4 (four) hours as needed. For nausea    Historical Provider, MD  Travoprost, BAK Free, (TRAVATAN) 0.004 % SOLN ophthalmic solution Place 1  drop into both eyes at bedtime.    Historical Provider, MD   BP 124/60  Pulse 87  Temp(Src) 98.3 F (36.8 C) (Oral)  Resp 23  Ht 5\' 5"  (1.651 m)  Wt 172 lb (78.019 kg)  BMI 28.62 kg/m2  SpO2 100% Physical Exam  Nursing note and vitals reviewed. Constitutional: She appears well-developed and well-nourished. No distress.  HENT:  Head: Normocephalic.  Eyes: Conjunctivae are normal.  Neck: Neck supple.  Cardiovascular: Normal rate, regular rhythm and normal heart sounds.   Pulmonary/Chest: Effort normal. No respiratory distress. She has no wheezes. She has no rales.  Diminished breath sounds throughout. tachypnec  Abdominal: Soft. Bowel sounds are normal. She exhibits no distension. There is no tenderness. There is no rebound.  Musculoskeletal: She exhibits no edema.  Neurological: She is alert.  Skin: Skin is warm and dry.  Psychiatric: She has a normal mood and affect. Her behavior is normal.    ED Course  Procedures (including critical care time) Labs Review Labs Reviewed  CBC WITH DIFFERENTIAL - Abnormal; Notable for the following:    WBC 10.7 (*)    Hemoglobin 10.6 (*)    HCT 35.5 (*)    MCH 25.5 (*)    MCHC 29.9 (*)    RDW 18.7 (*)    Neutrophils Relative % 92 (*)    Neutro Abs 9.8 (*)    Lymphocytes Relative 4 (*)    Lymphs Abs 0.4 (*)    All other components within normal limits  COMPREHENSIVE METABOLIC PANEL - Abnormal; Notable for the following:    Glucose, Bld 120 (*)    Albumin 2.7 (*)    GFR calc non Af Amer 89 (*)    All other components within normal limits  PRO B NATRIURETIC PEPTIDE - Abnormal; Notable for the following:    Pro B Natriuretic peptide (BNP) 671.3 (*)    All other components within normal limits  URINALYSIS, ROUTINE W REFLEX MICROSCOPIC - Abnormal; Notable for the following:    Color, Urine AMBER (*)    APPearance CLOUDY (*)  Bilirubin Urine SMALL (*)    All other components within normal limits  I-STAT VENOUS BLOOD GAS, ED -  Abnormal; Notable for the following:    pH, Ven 7.337 (*)    pCO2, Ven 68.9 (*)    Bicarbonate 36.9 (*)    Acid-Base Excess 9.0 (*)    All other components within normal limits  I-STAT TROPOININ, ED    Imaging Review Dg Chest 2 View  11/11/2013   CLINICAL DATA:  Shortness of breath and weakness with history of CHF and COPD.  EXAM: CHEST  2 VIEW  COMPARISON:  Portable chest x-ray of October 20, 2013  FINDINGS: The lungs are mildly hyperinflated. There is stable density in the lower anterior aspect of the right hemithorax consistent with scarring. The cardiopericardial silhouette is enlarged. The pulmonary vascularity is mildly prominent. The peripheral interstitial markings are mildly increased. There is no pleural effusion. The bony thorax is unremarkable.  IMPRESSION: COPD with superimposed low grade CHF. There is no evidence of pneumonia.   Electronically Signed   By: David  [place]   On: 11/11/2013 15:51     EKG Interpretation   Date/Time:  Thursday November 11 2013 14:19:53 EDT Ventricular Rate:  90 PR Interval:  178 QRS Duration: 150 QT Interval:  442 QTC Calculation: 541 R Axis:   77 Text Interpretation:  Sinus rhythm Right bundle branch block No  significant change since last tracing Confirmed by WARD,  DO, KRISTEN  ([id number]) on 11/11/2013 5:02:45 PM      MDM   Final diagnoses:  Shortness of breath  Chronic obstructive pulmonary disease, unspecified COPD, unspecified chronic bronchitis type   Patient's with gradual worsening of shortness of breath. History of the same. Recent admission for COPD. Will start on breathing treatments, she states she cannot take albuterol so will try Xopenex and Atrovent. Solu-Medrol 125 mg ordered. She recently finished a taper, and is currently on 10 mg of prednisone daily for her shortness of breath also just finished a course of doxycycline. She denies any cough or fever. Will get a chest x-ray however doubt pneumonia. Will get lab work.  7:03  PM Patient ambulated, however, got too excited, states went to the past and got short of breath. Patient requested something for anxiety. Ativan given. Patient normally takes Klonopin at home. She received another breathing treatment and she is feeling much better. States she feels at her baseline. She's requesting to be discharged home. She will use short of to being at home and she will continue all her neb treatments. She'll followup with Dr. Melvyn Novas. Instructed to return if worsening. Pt is maintaining oxygen sat 99% on 3-4L. No desaturation with walking.   Filed Vitals:   11/11/13 1800 11/11/13 1822 11/11/13 1830 11/11/13 1845  BP: 132/72 115/70 136/65 104/86  Pulse: 89 93 90 101  Temp:      TempSrc:      Resp: 22 19 23 25   Height:      Weight:      SpO2: 96% 97% 100% 95%     Renold Genta, PA-C 11/11/13 1956

## 2013-11-11 NOTE — ED Notes (Signed)
Gave pt coke and turkey sandwich 

## 2013-11-11 NOTE — ED Notes (Signed)
Pt feeling weak, when she stands, has difficulty breathing. Pt wears 4L [place] at home. Pt's sats 98% on 4LNc. BP 143/80, HR 110. EMS states lungs clear. Hx of COPD, CHF. Pt had MD appointment today but didn't feel well enough to go. Pt told EMS she feels her K is low and she is dehydrated.

## 2013-11-11 NOTE — ED Notes (Signed)
Blood gas drawn by [facility]

## 2013-11-11 NOTE — ED Notes (Addendum)
i-stat venous blood gas result given to Dr.Ward

## 2013-11-12 ENCOUNTER — Telehealth: Payer: Self-pay | Admitting: Cardiology

## 2013-11-12 DIAGNOSIS — R0600 Dyspnea, unspecified: Secondary | ICD-10-CM

## 2013-11-12 MED ORDER — FUROSEMIDE 20 MG PO TABS: 40.0000 mg | ORAL_TABLET | Freq: Every day | ORAL | Status: DC

## 2013-11-12 NOTE — Telephone Encounter (Signed)
Spoke with pt, Aware of dr Jacalyn Lefevre recommendations.  Order for labs mailed to pt for labs to be drawn in [place]. Script sent to the pharm

## 2013-11-12 NOTE — Telephone Encounter (Signed)
Spoke with pt, she is going to see dr wert next week and can have labs done at that time. Lab orders placed for [address].

## 2013-11-12 NOTE — Telephone Encounter (Signed)
Change lasix to 40 mg daily; bmet one week Brian Crenshaw  

## 2013-11-12 NOTE — Telephone Encounter (Signed)
Spoke with pt, her cxr showed low grade CHF in the ER. She is feeling better today but if she stands or does anything her heart rate will elevate and her 02 sat will drop to 70's and 80's. Resting sat is usually around 90. She denies edema but states she felt bloated inside yesterday. Her current dose of furosemdie is 10 mg bid. Will forward for dr Stanford Breed review

## 2013-11-12 NOTE — Telephone Encounter (Signed)
Pt went to [facility] ER yesterday,for fluid around her heart and in her lungs. They told her to call and find out if she needs to increase her Lasix?

## 2013-11-12 NOTE — Telephone Encounter (Signed)
Pt says she need to talk to you again.

## 2013-11-18 ENCOUNTER — Ambulatory Visit (INDEPENDENT_AMBULATORY_CARE_PROVIDER_SITE_OTHER): Payer: Medicare Other | Admitting: Adult Health

## 2013-11-18 ENCOUNTER — Encounter: Payer: Self-pay | Admitting: Adult Health

## 2013-11-18 VITALS — BP 128/78 | HR 93 | Temp 98.2°F

## 2013-11-18 DIAGNOSIS — J9610 Chronic respiratory failure, unspecified whether with hypoxia or hypercapnia: Secondary | ICD-10-CM

## 2013-11-18 DIAGNOSIS — J449 Chronic obstructive pulmonary disease, unspecified: Secondary | ICD-10-CM

## 2013-11-18 DIAGNOSIS — R0902 Hypoxemia: Secondary | ICD-10-CM

## 2013-11-18 DIAGNOSIS — J9611 Chronic respiratory failure with hypoxia: Secondary | ICD-10-CM

## 2013-11-18 NOTE — Assessment & Plan Note (Signed)
Recent flare complicated by diastolic dysfunction  Patient's medications were reviewed today and patient education was given. Computerized medication calendar was adjusted/completed   Plan  Follow med calendar closely and bring to each visit.  Continue on current regimen  Get flu shot next week as planned  follow up Dr. Melvyn Novas  In 6 weeks and As needed

## 2013-11-18 NOTE — Progress Notes (Signed)
Subjective:    Patient ID: [name] [name], female    DOB: October 01, 1940    MRN: [id number]  Brief patient profile:  88 yowf quit smoking  04/2011 dx'd with stage III COPD in 2004.  HPI 07/18/2011 f/u ov/Wert cc w/c bound due to weakness and 02 3lpm 24 hours per day. No cough, confused with meds/ names (only used saba so far on day of ov, not yet gotten around to symbicort) but seems to be getting along ok at this point.   rec Symbicort Take 2 puffs first thing in am and then another 2 puffs about 12 hours later.  Work on inhaler technique:   .    Only use your albuterol as a rescue medication   05/26/2012 f/u ov/Wert cc Chief Complaint  Patient presents with  . COPD    Breathing is unchanged.   doe x room to room even on 4lpm and self titrating 02 at other times rec No change rx Use med calendar      08/24/2012 f/u ov/Wert re copd/ using med calendar better in terms of maint but not prns Chief Complaint  Patient presents with  . 3 month follow up    increased SOB all the time - worsened over the past 2 wks.  Also has wheezing, chest tightness/chest pain, and prod cough with white mucus.    no def change in pain or cough, just the breathing, was fine on 6/27, much worse since assoc with hoarseness and ? Overt HB  >PPI and pepcid   09/07/2012 Follow up and Med review  Returns for follow up and med review .  - pt brought all meds with her today.  We reviewed all her medications and organized them into a medication calendar with patient education. Patient continues to complain of difficulty using her Symbicort inhaler. We discussed switching Symbicort over to, nebulized medications. Patient also had difficulty with Pepcid and has responded to switch over to Zantac with improvement. Patient denies any hemoptysis, orthopnea, PND, or leg swelling. Rec Follow med calendar closely and bring to each visit.  Switch Symbicort to Budesonide/Brovana Neb Twice daily    12/14/2012 f/u ov/Wert re:  copd GOLD III with reversibility/ chronic 02 dep Chief Complaint  Patient presents with  . Follow-up    PFT done today.  Still having SOB    Not following med calendar, no better on "the last neb you gave me" so back on symbicort, Not using 02 as rec Sob x 50 ft on 2lpm (was supposed to be on 3lpm per med calendar >Increase 02 to 4lpm with exertion but ok to use 2lpm at rest and sleeping   01/01/2013 Follow up and Med Review  Hx of COPD GOLD III with reversibility/ chronic 02 dep. Returns for follow up and med review .  Pt brought all meds with her today.  We reviewed all her medications and organized them into a medication calendar with patient education. Having more trouble with portable concentrator not charging.  Feels breathing is doing better on Symbicort.  We discussed pulmonary rehab, she agreed to referral.  No chest pain , orthopnea, edema, abd pain , n/v/d.  >>no changes   05/10/2013 Follow up  Returns for a  4 month follow up COPD -  Reports having increased SOB, occ chest tightness x2 months with prod cough with thick white mucus.  These symptoms wax and wane. Over all feels about the same with no sign changes in her baseline  Discussed  pulmonary rehab referral and she is open to this.  No chest pain, orthopnea, edema or fever.  >>no changes   09/07/2013 Follow up COPD  MW pt here for 4 month COPD follow up.  Reports increased SOB x2-49months with wheezing, tightness, prod cough with white mucus, nausea.   Worse for last 1 week.  Denies f/c/s/v/d, hemoptysis, PND, leg swelling. On Symbicort and Atrovent Neb Four times a day   On O2 at 4l/m .  rec Doxycycline 100mg  Twice daily  For 7 days  Prednisone taper over next week.  Mucinex DM Twice daily  As needed  Cough/congestion    Admission date: 10/20/2013   Discharge Date: 10/22/2013  Discharge Diagnosis  :  Acute on chronic respiratory failure  COPD GOLD III 02 dep  CAD  Acute exacerbation of chronic obstructive  pulmonary disease (COPD)  Anxiety disorder  History of ventricular tachycardia  History of present illness and Hospital Course:  Kindly see H&P for history of present illness and admission details, please review complete Labs, Consult reports and Test reports for all details in brief  HPI from the history and physical done on the day of admission  [name] [name] is a 73 y.o. female with a past medical history of COPD, coronary artery disease on medical management, history of ventricular tachycardia on amiodarone, who was in her usual state of health till about 2-3 days pta, when she started noticing that her breathing was getting worse. This was mainly with exertion. She has chronic shortness of breath, but this has been getting worse. She denies any cough, no fever. Has been taking her medications as prescribed. Denies any chest pain. She had noticed that when she would exert she would feel like she was going to pass out, but denies any syncopal episode. Has had some nausea, chronically, but denies any vomiting. Denies any abdominal pain. No recent travel anywhere. She is on home oxygen at 4 L per minute. Denies any leg swelling. No history suggestive of orthopnea or PND.  Hospital Course  #1 Acute on chronic hypoxic respiratory failure with acute COPD exacerbation: Has chronic respiratory failure with advanced underlying COPD, uses 4lit/min nasal cannula oxygen at home. Prudent at baseline after IV Solu-Medrol, schedule an as needed nebulizer treatments along with supplemental oxygen and oral doxycycline. No pulse ox 100% on 4 L nasal cannula and she is symptom-free he could go home, will be discharged home on oral steroid taper and 5 more days of oral doxycycline with outpatient followup with PCP and her pulmonologist.  #2. Abnormal EKG: Right bundle branch block with some intraventricular conduction delay, she was completely symptom-free, these changes are chronic although slightly more pronounced  initially during admission. Negative Troponin x3 sets. She had a cardiac catheterization in 2012, which revealed high-grade stenosis of the LAD, and high grade calcified nodule involving the proximal circumflex artery. She was not considered a good candidate for intervention at that time. She is symptom-free and chest pain-free continue medical management with aspirin, Plavix, statin, Imdur, Cardizem. She's not on beta blocker due to advanced COPD. have requested her to follow with her primary cardiologist post discharge. She is completely symptom-free.  #3 History of coronary artery disease, on medical management: Continue with her home medications as in #2 above, She's not on beta blocker, presumably due to her severe lung disease.  #4 History of ventricular tachycardia: Continue with amiodarone. Monitor on telemetry, last EF 60% on echogram and 2013.    10/29/2013 post hosp  f/u ov/Wert re: weak p admit / 02 3lpm  Chief Complaint  Patient presents with  . HFU  6 x 5mg  prednisone this am tapering off "always seems to help but wears off after a week.  No purulent sputum continues with congested cough ? Has flutter  On symbicort but hfa very poor, has neb. No sob at rest or noct symptoms >>change to pulmicort /perforomist   11/18/2013 Follow up and med review  Pt returns for follow up and med review .  Reviewed all her meds and organized them into a med calendar . Appears to be taking meds correctly except did not increase lasix as recommended to 40mg  -was afraid this was too much . We discussed her disease process and cardiology recommendations for diuretics.  Was changed to perforomist/budesonide last ov , unable to determine if any better or not. No flare of cough or wheezing Is chronically dyspenic. Had CHF flare last week with ER visit , BNP elevated ~600 , increased lasix which helped.  No ankle edema. Wt is down 4l/bs  Is with family member today, feels she is going down hill over last year.         Current Medications, Allergies, Complete Past Medical History, Past Surgical History, Family History, and Social History were reviewed in [employer] record.  ROS  The following are not active complaints unless bolded sore throat, dysphagia, dental problems, itching, sneezing,  nasal congestion or excess/ purulent secretions, ear ache,   fever, chills, sweats, unintended wt loss, pleuritic or exertional cp, hemoptysis,  orthopnea pnd or leg swelling, presyncope, palpitations, heartburn, abdominal pain, anorexia, nausea, vomiting, diarrhea  or change in bowel or urinary habits, change in stools or urine, dysuria,hematuria,  rash, arthralgias, visual complaints, headache, numbness weakness or ataxia or problems with walking or coordination,  change in mood/affect or memory.                    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)  HYPERLIPIDEMIA (ICD-272.4)  HYPERTENSION (ICD-401.9)  CEREBROVASCULAR DISEASE (ICD-437.9)  - 80% R Carotid stenosis - see note by Bradham11/16/11  GLAUCOMA (ICD-365.9)  OBESITY (ICD-278.00)  COPD (ICD-496)  - PFT's 12/10/2002 FEV1 .85(37%) ratio 47 and 11 % better p B2, DLC0 42%  - Walking sats January 25, 2010 > desat even on 2lpm > rec 2.5 lpm 24 h per day  -Med Calendar 01/01/2013 , 11/18/2013       Objective:   Physical Exam    GEN: elderly w/c bound, chronically ill appearing  In wheelchair , tearful   HEENT mild turbinate edema.  Oropharynx no thrush or excess pnd or cobblestoning.  No JVD or cervical adenopathy. Mild accessory muscle hypertrophy. Trachea midline, nl thryroid. Chest was hyperinflated by percussion with diminished breath sounds and moderate increased exp time without wheeze. Hoover sign positive at mid inspiration. Regular rate and rhythm without murmur gallop or rub or increase P2 or edema.  Abd: no hsm, nl excursion. Ext warm without cyanosis or clubbing. Skin with classic  steroid ecchymoses both UEs           cxr 09/07/13 Emphysema. Stable basilar scarring and mild cardiomegaly.  10/20/13  pCXR No acute findings    Chemistry      Component Value Date/Time   NA 142 10/21/2013 0630   K 4.3 10/21/2013 0630   CL 97 10/21/2013 0630   CO2 26 10/21/2013 0630   BUN 13 10/21/2013 0630   CREATININE 0.61  10/21/2013 0630   CREATININE 0.63 08/01/2011 1554      Component Value Date/Time   CALCIUM 9.1 10/21/2013 0630   ALKPHOS 74 10/21/2013 0630   AST 34 10/21/2013 0630   ALT 26 10/21/2013 0630   BILITOT 0.3 10/21/2013 0630        Lab Results  Component Value Date   WBC 10.3 10/21/2013   HGB 10.0* 10/21/2013   HCT 33.0* 10/21/2013   MCV 85.9 10/21/2013   PLT 247 10/21/2013   Lab Results  Component Value Date   PROBNP 17.0 01/31/2012           Assessment & Plan:

## 2013-11-18 NOTE — Patient Instructions (Signed)
Follow med calendar closely and bring to each visit.  Continue on current regimen  Get flu shot next week as planned  follow up Dr. Melvyn Novas  In 6 weeks and As needed

## 2013-11-18 NOTE — Assessment & Plan Note (Signed)
Cont on current regimen  

## 2013-11-24 NOTE — Addendum Note (Signed)
Addended by: Parke Poisson E on: 11/24/2013 09:13 AM   Modules accepted: Orders, Medications

## 2013-12-10 ENCOUNTER — Encounter: Payer: Self-pay | Admitting: Family

## 2013-12-10 ENCOUNTER — Telehealth: Payer: Self-pay | Admitting: Internal Medicine

## 2013-12-10 NOTE — Telephone Encounter (Signed)
No samples of xopenex HFA  Pt aware  Nothing further needed

## 2013-12-13 ENCOUNTER — Encounter: Payer: Self-pay | Admitting: Family

## 2013-12-13 ENCOUNTER — Ambulatory Visit (HOSPITAL_COMMUNITY)
Admission: RE | Admit: 2013-12-13 | Discharge: 2013-12-13 | Disposition: A | Discharge status: Home / Self Care | Payer: Medicare Other | Source: Ambulatory Visit | Attending: Family | Admitting: Family

## 2013-12-13 ENCOUNTER — Ambulatory Visit (INDEPENDENT_AMBULATORY_CARE_PROVIDER_SITE_OTHER): Payer: Medicare Other | Admitting: Family

## 2013-12-13 VITALS — BP 123/70 | HR 87 | Resp 16 | Ht 65.0 in | Wt 172.0 lb

## 2013-12-13 DIAGNOSIS — I6523 Occlusion and stenosis of bilateral carotid arteries: Secondary | ICD-10-CM | POA: Diagnosis not present

## 2013-12-13 DIAGNOSIS — R0602 Shortness of breath: Secondary | ICD-10-CM | POA: Insufficient documentation

## 2013-12-13 DIAGNOSIS — R479 Unspecified speech disturbances: Secondary | ICD-10-CM | POA: Insufficient documentation

## 2013-12-13 DIAGNOSIS — I6529 Occlusion and stenosis of unspecified carotid artery: Secondary | ICD-10-CM | POA: Insufficient documentation

## 2013-12-13 DIAGNOSIS — R490 Dysphonia: Secondary | ICD-10-CM

## 2013-12-13 DIAGNOSIS — R531 Weakness: Secondary | ICD-10-CM

## 2013-12-13 NOTE — Progress Notes (Signed)
[facility] [place] HISTORY AND PHYSICAL   MRN : [id number]  History of Present Illness:   [name] [name] is a 73 y.o. female patient of Dr. Trula Slade who returns today for followup of her left carotid stenosis. She had Bell's palsy in 2014 that affected the right side of her face. She did have an MRI scan which was negative for infarct when she was diagnosed with Bell's palsy. Her symptoms were intermittent and bothersome to her. She reports her right eyelid is lagging more but feels like her speech has improved. Dr. Trula Slade has been following her for progressive greater than 80% left carotid stenosis. She has been deemed a poor candidate for surgery from both cardiac and a pulmonary perspective. Dr. Trula Slade could not place a carotid stent given her vessel tortuosity.  She is being followed by Dr. Stanford Breed for her aortic aneurysm. Most recent ultrasound showed a 4.3 cm aneurysm on 08/09/13.  She denies abdominal pain or back pain. 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  She ambulates to the restroom, dyspnea and weakness is a barrier for her.  Pt Diabetic: No Pt smoker: former smoker, quit in 2013  Pt meds include: Statin :Yes Betablocker: No ASA: Yes Other anticoagulants/antiplatelets: Plavix  Current Outpatient Prescriptions  Medication Sig Dispense Refill  . acetaminophen-codeine (TYLENOL #3) 300-30 MG per tablet Take 1 tablet by mouth every 8 (eight) hours as needed for moderate pain.       Marland Kitchen amiodarone (PACERONE) 200 MG tablet Take 200 mg by mouth at bedtime.       Marland Kitchen aspirin EC 81 MG tablet Take 81 mg by mouth at bedtime.      . budesonide (PULMICORT) 0.25 MG/2ML nebulizer solution 2 ml twice daily with perforomist  120 mL  12  . clonazePAM (KLONOPIN) 0.5 MG tablet 1/2-1 every 6 hours as needed for anxiety      . clopidogrel (PLAVIX) 75 MG tablet Take 1 tablet (75 mg total) by mouth at bedtime.      Marland Kitchen Dextromethorphan-Guaifenesin ([place] FAST-MAX DM  MAX) 5-100 MG/5ML LIQD Take 5 mLs by mouth every 12 (twelve) hours as needed (for congestion).       Marland Kitchen diltiazem (CARDIZEM CD) 300 MG 24 hr capsule Take 300 mg by mouth daily.      . formoterol (PERFOROMIST) 20 MCG/2ML nebulizer solution Take 2 mLs (20 mcg total) by nebulization 2 (two) times daily.  120 mL  11  . furosemide (LASIX) 20 MG tablet Take 2 tablets (40 mg total) by mouth daily.  60 tablet  12  . ipratropium (ATROVENT) 0.02 % nebulizer solution Take 0.5 mg by nebulization 4 (four) times daily.       . isosorbide mononitrate (IMDUR) 30 MG 24 hr tablet Take 30 mg by mouth daily.      Marland Kitchen levalbuterol (XOPENEX HFA) 45 MCG/ACT inhaler Inhale 2 puffs into the lungs every 4 (four) hours as needed for wheezing.  15 g  6  . nitroGLYCERIN (NITROSTAT) 0.4 MG SL tablet Place 0.4 mg under the tongue every 5 (five) minutes as needed for chest pain (may repeat x3). Chest pain      . pantoprazole (PROTONIX) 40 MG tablet Take 40 mg by mouth daily.      . potassium chloride SA (K-DUR,KLOR-CON) 20 MEQ tablet 2 tabs by mouth once daily      . pravastatin (PRAVACHOL) 80 MG tablet Take 80 mg by mouth at bedtime.      Marland Kitchen  predniSONE (DELTASONE) 20 MG tablet Take 10 mg by mouth daily with breakfast.      . promethazine (PHENERGAN) 25 MG tablet Take 25 mg by mouth every 4 (four) hours as needed for nausea.       . ranitidine (ZANTAC) 150 MG capsule Take 150 mg by mouth at bedtime.      . Travoprost, BAK Free, (TRAVATAN) 0.004 % SOLN ophthalmic solution Place 1 drop into both eyes at bedtime.       No current facility-administered medications for this visit.   Facility-Administered Medications Ordered in Other Visits  Medication Dose Route Frequency Provider Last Rate Last Dose  . mitomycin (MUTAMYCIN) chemo injection 40 mg  40 mg Bladder Instillation Once Festus Aloe, MD        Past Medical History  Diagnosis Date  . Hypertension   . Hyperlipidemia   . Cerebrovascular disease, unspecified   .  Unspecified glaucoma   . Obesity, unspecified   . Coronary artery disease     cath 04/06/10: LAD occluded with R-L collats; med Rx WTC....probable V. Tach...med Rx  . Ventricular tachycardia   . SVT (supraventricular tachycardia)   . AAA (abdominal aortic aneurysm)   . COPD (chronic obstructive pulmonary disease) 2013    [place] of breath     WITH ACTIVITY--USES OXYGEN AS NEEDED  2&1/2 L PER NASAL CANNUL  . Complication of anesthesia     PT STATES SHE CAN NOT BE PUT TO SLEEP BECAUSE OF HER LUNG PROBLEMS  . Carotid artery occlusion   . Anemia   . CHF (congestive heart failure)   . Abdominal aneurysm     "hasn't been repaired" (10/20/2013)  . Pneumonia X ~ 2  . On home oxygen therapy     "4L; 24/7" (10/20/2013)  . GERD (gastroesophageal reflux disease)   . Migraines     "before tubal ligation I had them alot; seldom have one anymore" (10/20/2013)  . Arthritis     "knees mainly" (10/20/2013)  . Bladder cancer 2009    Social History History  Substance Use Topics  . Smoking status: Former Smoker -- 2.00 packs/day for 50 years    Types: Cigarettes    Quit date: 05/18/2011  . Smokeless tobacco: Never Used  . Alcohol Use: No    Family History Family History  Problem Relation Age of Onset  . Heart disease Mother 23  . Heart disease Father   . Hyperlipidemia Father   . Hypertension Father   . Diabetes Daughter   . Peripheral vascular disease Daughter     Surgical History Past Surgical History  Procedure Laterality Date  . Cystoscopy    . Bladder tumor excision      resection of 5 bladder, and fulguration of 1 small bladder tumor  . Cystoscopy      cold cup bladder biopsy of five tumors, and fulguration of one tumor  . Wedge resection Right 1980's    Remote right lung  . Cystoscopy with biopsy  12/24/2011    Procedure: CYSTOSCOPY WITH BIOPSY;  Surgeon: Fredricka Bonine, MD;  Location: [facility];  Service: Urology;  Laterality: N/A;  . Tubal  ligation Bilateral 1980's  . Cataract extraction w/ intraocular lens  implant, bilateral Bilateral ~ 2011  . Cardiac catheterization  "several"    "too bad to put stents in"    Allergies  Allergen Reactions  . Albuterol Other (See Comments)    Jittery/shakiness  . Levaquin [Levofloxacin In D5w] Nausea  Only  . Sulfa Antibiotics Nausea Only    Current Outpatient Prescriptions  Medication Sig Dispense Refill  . acetaminophen-codeine (TYLENOL #3) 300-30 MG per tablet Take 1 tablet by mouth every 8 (eight) hours as needed for moderate pain.       Marland Kitchen amiodarone (PACERONE) 200 MG tablet Take 200 mg by mouth at bedtime.       Marland Kitchen aspirin EC 81 MG tablet Take 81 mg by mouth at bedtime.      . budesonide (PULMICORT) 0.25 MG/2ML nebulizer solution 2 ml twice daily with perforomist  120 mL  12  . clonazePAM (KLONOPIN) 0.5 MG tablet 1/2-1 every 6 hours as needed for anxiety      . clopidogrel (PLAVIX) 75 MG tablet Take 1 tablet (75 mg total) by mouth at bedtime.      Marland Kitchen Dextromethorphan-Guaifenesin ([place] FAST-MAX DM MAX) 5-100 MG/5ML LIQD Take 5 mLs by mouth every 12 (twelve) hours as needed (for congestion).       Marland Kitchen diltiazem (CARDIZEM CD) 300 MG 24 hr capsule Take 300 mg by mouth daily.      . formoterol (PERFOROMIST) 20 MCG/2ML nebulizer solution Take 2 mLs (20 mcg total) by nebulization 2 (two) times daily.  120 mL  11  . furosemide (LASIX) 20 MG tablet Take 2 tablets (40 mg total) by mouth daily.  60 tablet  12  . ipratropium (ATROVENT) 0.02 % nebulizer solution Take 0.5 mg by nebulization 4 (four) times daily.       . isosorbide mononitrate (IMDUR) 30 MG 24 hr tablet Take 30 mg by mouth daily.      Marland Kitchen levalbuterol (XOPENEX HFA) 45 MCG/ACT inhaler Inhale 2 puffs into the lungs every 4 (four) hours as needed for wheezing.  15 g  6  . nitroGLYCERIN (NITROSTAT) 0.4 MG SL tablet Place 0.4 mg under the tongue every 5 (five) minutes as needed for chest pain (may repeat x3). Chest pain      .  pantoprazole (PROTONIX) 40 MG tablet Take 40 mg by mouth daily.      . potassium chloride SA (K-DUR,KLOR-CON) 20 MEQ tablet 2 tabs by mouth once daily      . pravastatin (PRAVACHOL) 80 MG tablet Take 80 mg by mouth at bedtime.      . predniSONE (DELTASONE) 20 MG tablet Take 10 mg by mouth daily with breakfast.      . promethazine (PHENERGAN) 25 MG tablet Take 25 mg by mouth every 4 (four) hours as needed for nausea.       . ranitidine (ZANTAC) 150 MG capsule Take 150 mg by mouth at bedtime.      . Travoprost, BAK Free, (TRAVATAN) 0.004 % SOLN ophthalmic solution Place 1 drop into both eyes at bedtime.       No current facility-administered medications for this visit.   Facility-Administered Medications Ordered in Other Visits  Medication Dose Route Frequency Provider Last Rate Last Dose  . mitomycin (MUTAMYCIN) chemo injection 40 mg  40 mg Bladder Instillation Once Festus Aloe, MD         REVIEW OF SYSTEMS: See HPI for pertinent positives and negatives.  Physical Examination Filed Vitals:   12/13/13 1403 12/13/13 1406  BP: 129/81 123/70  Pulse: 96 87  Resp:  16  Height:  5\' 5"  (1.651 m)  Weight:  172 lb (78.019 kg)  SpO2:  96%   Body mass index is 28.62 kg/(m^2).  General:  WDWN in NAD, seated in her wheelchair Gait: in  wheelchair HENT: WNL Eyes: Pupils equal Pulmonary: normal non-labored breathing , without Rales, rhonchi,  Wheezing, BBS with diminished air movement in all fields, patient is using supplemental oxygen by nasal canula. Cardiac: RRR, no murmur detected  Abdomen: soft, NT, no masses palpated Skin: no rashes, ulcers noted;  no Gangrene , no cellulitis; no open wounds;   VASCULAR EXAM  Carotid Bruits Right Left   Negative Negative       Radial pulses are 1+ palpable and = Aorta is not palpable                       VASCULAR EXAM: Extremities without ischemic changes  without Gangrene; without open wounds.                                                                                                           LE Pulses Right Left       FEMORAL   palpable   palpable        POPLITEAL  not palpable   not palpable       POSTERIOR TIBIAL  not palpable   not palpable        DORSALIS PEDIS      ANTERIOR TIBIAL not palpable  not palpable     Musculoskeletal: no muscle wasting or atrophy; no peripheral edema  Neurologic: A&O X 3; Appropriate Affect ;  SENSATION: normal; MOTOR FUNCTION: 5/5 Symmetric, CN 2-12 intact Speech is fluent/normal    Non-Invasive Vascular Imaging (12/13/2013):   CEREBROVASCULAR DUPLEX EVALUATION    INDICATION: Carotid artery disease    PREVIOUS INTERVENTION(S):     DUPLEX EXAM: Carotid duplex    RIGHT  LEFT  Peak Systolic Velocities (cm/s) End Diastolic Velocities (cm/s) Plaque LOCATION Peak Systolic Velocities (cm/s) End Diastolic Velocities (cm/s) Plaque  108 31 HT CCA PROXIMAL 37 4 HT  86 27 HT CCA MID 36 6 HT  80 24 CP CCA DISTAL 40 8 HT  93 9 - ECA 76 9 -  73 20 - ICA PROXIMAL 543 200 HT  97 23 - ICA MID 17 8 -  98 37 - ICA DISTAL 24 12 -    1.1 ICA / CCA Ratio (PSV) 15.0  Antegrade Vertebral Flow Antegrade  500 Brachial Systolic Pressure (mmHg) 938  Triphasic Brachial Artery Waveforms Triphasic    Plaque Morphology:  HM = Homogeneous, HT = Heterogeneous, CP = Calcific Plaque, SP = Smooth Plaque, IP = Irregular Plaque  ADDITIONAL FINDINGS:     IMPRESSION: 1. Technically difficult due to breathing artifact. 2. Less than 40% right internal carotid artery stenosis. 3. 80 - 99% left internal carotid artery stenosis.    Compared to the previous exam:  No significant change      ASSESSMENT:  [name] [name] is a 73 y.o. female with a history of left carotid stenosis. She had Bell's palsy in 2014. She did have an MRI scan which was negative for infarct when she was diagnosed with Bell's palsy. Her symptoms were intermittent and bothersome to  her. She reports no new neurologic symptoms. Dr.  Trula Slade has been following her for progressive greater than 80% left carotid stenosis. She has been deemed a poor candidate for surgery from both cardiac and a pulmonary perspective. Dr. Trula Slade could not place a carotid stent given her vessel tortuosity.  She is being followed by Dr. Stanford Breed for her aortic aneurysm. Most recent ultrasound showed a 4.3 cm aneurysm on 08/09/13.  She denies abdominal of back pain.  Today's carotid Duplex result: Technically difficult due to breathing artifact. Less than 40% right internal carotid artery stenosis. 80 - 99% left internal carotid artery stenosis. No significant change from previous Duplex.   PLAN:   Seated leg and arm exercises several times/day, as discussed and demonstrated, she is not able to walk much.   Based on today's exam and non-invasive vascular lab results, and after discussing with Dr. Trula Slade, the patient will follow up in 6 months with the following tests carotid Duplex. I discussed in depth with the patient the nature of atherosclerosis, and emphasized the importance of maximal medical management including strict control of blood pressure, blood glucose, and lipid levels, obtaining regular exercise, and cessation of smoking.  The patient is aware that without maximal medical management the underlying atherosclerotic disease process will progress, limiting the benefit of any interventions. Consideration for repair of AAA would be made when the size approaches 4.8 or 5.0 cm, growth > 1 cm/yr, and symptomatic status. The patient was given information about stroke prevention and what symptoms should prompt the patient to seek immediate medical care. The patient was given information about AAA including signs, symptoms, treatment,  what symptoms should prompt the patient to seek immediate medical care, and how to minimize the risk of enlargement and rupture of aneurysms.  Thank you for allowing [place] to participate in this patient's  care.  Clemon Chambers, RN, MSN, FNP-C [facility] Office: [phone]  Clinic MD: Trula Slade  12/13/2013 1:55 PM

## 2013-12-13 NOTE — Patient Instructions (Signed)
Stroke Prevention Some medical conditions and behaviors are associated with an increased chance of having a stroke. You may prevent a stroke by making healthy choices and managing medical conditions. HOW CAN I REDUCE MY RISK OF HAVING A STROKE?   Stay physically active. Get at least 30 minutes of activity on most or all days.  Do not smoke. It may also be helpful to avoid exposure to secondhand smoke.  Limit alcohol use. Moderate alcohol use is considered to be:  No more than 2 drinks per day for men.  No more than 1 drink per day for nonpregnant women.  Eat healthy foods. This involves:  Eating 5 or more servings of fruits and vegetables a day.  Making dietary changes that address high blood pressure (hypertension), high cholesterol, diabetes, or obesity.  Manage your cholesterol levels.  Making food choices that are high in fiber and low in saturated fat, trans fat, and cholesterol may control cholesterol levels.  Take any prescribed medicines to control cholesterol as directed by your health care provider.  Manage your diabetes.  Controlling your carbohydrate and sugar intake is recommended to manage diabetes.  Take any prescribed medicines to control diabetes as directed by your health care provider.  Control your hypertension.  Making food choices that are low in salt (sodium), saturated fat, trans fat, and cholesterol is recommended to manage hypertension.  Take any prescribed medicines to control hypertension as directed by your health care provider.  Maintain a healthy weight.  Reducing calorie intake and making food choices that are low in sodium, saturated fat, trans fat, and cholesterol are recommended to manage weight.  Stop drug abuse.  Avoid taking birth control pills.  Talk to your health care provider about the risks of taking birth control pills if you are over 35 years old, smoke, get migraines, or have ever had a blood clot.  Get evaluated for sleep  disorders (sleep apnea).  Talk to your health care provider about getting a sleep evaluation if you snore a lot or have excessive sleepiness.  Take medicines only as directed by your health care provider.  For some people, aspirin or blood thinners (anticoagulants) are helpful in reducing the risk of forming abnormal blood clots that can lead to stroke. If you have the irregular heart rhythm of atrial fibrillation, you should be on a blood thinner unless there is a good reason you cannot take them.  Understand all your medicine instructions.  Make sure that other conditions (such as anemia or atherosclerosis) are addressed. SEEK IMMEDIATE MEDICAL CARE IF:   You have sudden weakness or numbness of the face, arm, or leg, especially on one side of the body.  Your face or eyelid droops to one side.  You have sudden confusion.  You have trouble speaking (aphasia) or understanding.  You have sudden trouble seeing in one or both eyes.  You have sudden trouble walking.  You have dizziness.  You have a loss of balance or coordination.  You have a sudden, severe headache with no known cause.  You have new chest pain or an irregular heartbeat. Any of these symptoms may represent a serious problem that is an emergency. Do not wait to see if the symptoms will go away. Get medical help at once. Call your local emergency services (911 in [place] Do not drive yourself to the hospital. Document Released: 03/21/2004 Document Revised: 06/28/2013 Document Reviewed: 08/14/2012 ExitCare Patient Information 2015 [employer]. This information is not intended to replace advice given   to you by your health care provider. Make sure you discuss any questions you have with your health care provider.   Abdominal Aortic Aneurysm An aneurysm is a weakened or damaged part of an artery wall that bulges from the normal force of blood pumping through the body. An abdominal aortic aneurysm is an aneurysm that  occurs in the lower part of the aorta, the main artery of the body.  The major concern with an abdominal aortic aneurysm is that it can enlarge and burst (rupture) or blood can flow between the layers of the wall of the aorta through a tear (aorticdissection). Both of these conditions can cause bleeding inside the body and can be life threatening unless diagnosed and treated promptly. CAUSES  The exact cause of an abdominal aortic aneurysm is unknown. Some contributing factors are:   A hardening of the arteries caused by the buildup of fat and other substances in the lining of a blood vessel (arteriosclerosis).  Inflammation of the walls of an artery (arteritis).   Connective tissue diseases, such as Marfan syndrome.   Abdominal trauma.   An infection, such as syphilis or staphylococcus, in the wall of the aorta (infectious aortitis) caused by bacteria. RISK FACTORS  Risk factors that contribute to an abdominal aortic aneurysm may include:  Age older than 58 years.   High blood pressure (hypertension).  Female gender.  Ethnicity (white race).  Obesity.  Family history of aneurysm (first degree relatives only).  Tobacco use. PREVENTION  The following healthy lifestyle habits may help decrease your risk of abdominal aortic aneurysm:  Quitting smoking. Smoking can raise your blood pressure and cause arteriosclerosis.  Limiting or avoiding alcohol.  Keeping your blood pressure, blood sugar level, and cholesterol levels within normal limits.  Decreasing your salt intake. In somepeople, too much salt can raise blood pressure and increase your risk of abdominal aortic aneurysm.  Eating a diet low in saturated fats and cholesterol.  Increasing your fiber intake by including whole grains, vegetables, and fruits in your diet. Eating these foods may help lower blood pressure.  Maintaining a healthy weight.  Staying physically active and exercising regularly. SYMPTOMS  The  symptoms of abdominal aortic aneurysm may vary depending on the size and rate of growth of the aneurysm.Most grow slowly and do not have any symptoms. When symptoms do occur, they may include:  Pain (abdomen, side, lower back, or groin). The pain may vary in intensity. A sudden onset of severe pain may indicate that the aneurysm has ruptured.  Feeling full after eating only small amounts of food.  Nausea or vomiting or both.  Feeling a pulsating lump in the abdomen.  Feeling faint or passing out. DIAGNOSIS  Since most unruptured abdominal aortic aneurysms have no symptoms, they are often discovered during diagnostic exams for other conditions. An aneurysm may be found during the following procedures:  Ultrasonography (A one-time screening for abdominal aortic aneurysm by ultrasonography is also recommended for all men aged 5-75 years who have ever smoked).  X-ray exams.  A computed tomography (CT).  Magnetic resonance imaging (MRI).  Angiography or arteriography. TREATMENT  Treatment of an abdominal aortic aneurysm depends on the size of your aneurysm, your age, and risk factors for rupture. Medication to control blood pressure and pain may be used to manage aneurysms smaller than 6 cm. Regular monitoring for enlargement may be recommended by your caregiver if:  The aneurysm is 3-4 cm in size (an annual ultrasonography may be recommended).  The  The aneurysm is 4-4.5 cm in size (an ultrasonography every 6 months may be recommended).  The aneurysm is larger than 4.5 cm in size (your caregiver may ask that you be examined by a vascular surgeon). If your aneurysm is larger than 6 cm, surgical repair may be recommended. There are two main methods for repair of an aneurysm:   Endovascular repair (a minimally invasive surgery). This is done most often.  Open repair. This method is used if an endovascular repair is not possible. Document Released: 11/21/2004 Document Revised: 06/08/2012  Document Reviewed: 03/13/2012 Exit[employer] Patient Information 2015 [employer]. This information is not intended to replace advice given to you by your health care provider. Make sure you discuss any questions you have with your health care provider.  

## 2013-12-13 NOTE — Progress Notes (Signed)
HPI: FU CAD, hypertension, hyperlipidemia, SVT, AAA, COPD, bladder cancer and cerebrovascular disease. She presented to [facility] in early February 2012 for a COPD exacerbation and possible diastolic heart failure. She was noted to have wide complex tachycardia that was felt to be ventricular tachycardia and she was transferred to [facility]. Cardiac catheterization on 04/06/10 demonstrated a totally occluded LAD with right to left collaterals, a nodular density in the prox CFX that occupied about 70% of the prox vessel, and preserved LVF. It was felt that her CAD should be treated medically. She was evaluated by Dr. Lovena Le. It was felt that her ventricular tachycardia would also be treated medically with adjustments in her beta blocker therapy. The patient has previous attempt at carotid stent on the left. The procedure was unsuccessful due to anatomy; followed by vascular surgery. Repeat echocardiogram in March of 2013 showed normal LV function. There was trace aortic insufficiency and mild left atrial enlargement. Patient also on amiodarone for h/o SVT. Ultrasound 6/15 revealed 4.3 x 4.3 cm AAA. Patient was admitted in August with COPD flare. She was seen in emergency room in September with increased CHF symptoms and Lasix was increased. Her dyspnea is now back to baseline. No chest pain, palpitations or syncope. No pedal edema.   Current Outpatient Prescriptions  Medication Sig Dispense Refill  . acetaminophen-codeine (TYLENOL #3) 300-30 MG per tablet Take 1 tablet by mouth every 8 (eight) hours as needed for moderate pain.       Marland Kitchen amiodarone (PACERONE) 200 MG tablet Take 200 mg by mouth at bedtime.       Marland Kitchen aspirin EC 81 MG tablet Take 81 mg by mouth at bedtime.      . budesonide (PULMICORT) 0.25 MG/2ML nebulizer solution 2 ml twice daily with perforomist  120 mL  12  . clonazePAM (KLONOPIN) 0.5 MG tablet 1/2-1 every 6 hours as needed for anxiety      . clopidogrel (PLAVIX)  75 MG tablet Take 1 tablet (75 mg total) by mouth at bedtime.      Marland Kitchen Dextromethorphan-Guaifenesin ([place] FAST-MAX DM MAX) 5-100 MG/5ML LIQD Take 5 mLs by mouth every 12 (twelve) hours as needed (for congestion).       Marland Kitchen diltiazem (CARDIZEM CD) 300 MG 24 hr capsule Take 300 mg by mouth daily.      . furosemide (LASIX) 20 MG tablet Take 2 tablets (40 mg total) by mouth daily.  60 tablet  12  . ipratropium (ATROVENT) 0.02 % nebulizer solution Take 0.5 mg by nebulization 4 (four) times daily.       . isosorbide mononitrate (IMDUR) 30 MG 24 hr tablet Take 30 mg by mouth daily.      Marland Kitchen levalbuterol (XOPENEX HFA) 45 MCG/ACT inhaler Inhale 2 puffs into the lungs every 4 (four) hours as needed for wheezing.  15 g  6  . nitroGLYCERIN (NITROSTAT) 0.4 MG SL tablet Place 0.4 mg under the tongue every 5 (five) minutes as needed for chest pain (may repeat x3). Chest pain      . pantoprazole (PROTONIX) 40 MG tablet Take 40 mg by mouth daily.      . potassium chloride SA (K-DUR,KLOR-CON) 20 MEQ tablet 2 tabs by mouth once daily      . pravastatin (PRAVACHOL) 80 MG tablet Take 80 mg by mouth at bedtime.      . predniSONE (DELTASONE) 20 MG tablet Take 10 mg by mouth daily with breakfast.      .  promethazine (PHENERGAN) 25 MG tablet Take 25 mg by mouth every 4 (four) hours as needed for nausea.       . ranitidine (ZANTAC) 150 MG capsule Take 150 mg by mouth at bedtime.      . Travoprost, BAK Free, (TRAVATAN) 0.004 % SOLN ophthalmic solution Place 1 drop into both eyes at bedtime.       No current facility-administered medications for this visit.   Facility-Administered Medications Ordered in Other Visits  Medication Dose Route Frequency Provider Last Rate Last Dose  . mitomycin (MUTAMYCIN) chemo injection 40 mg  40 mg Bladder Instillation Once Festus Aloe, MD         Past Medical History  Diagnosis Date  . Hypertension   . Hyperlipidemia   . Cerebrovascular disease, unspecified   . Unspecified  glaucoma   . Obesity, unspecified   . Coronary artery disease     cath 04/06/10: LAD occluded with R-L collats; med Rx WTC....probable V. Tach...med Rx  . Ventricular tachycardia   . SVT (supraventricular tachycardia)   . AAA (abdominal aortic aneurysm)   . COPD (chronic obstructive pulmonary disease) 2013    [place] of breath     WITH ACTIVITY--USES OXYGEN AS NEEDED  2&1/2 L PER NASAL CANNUL  . Complication of anesthesia     PT STATES SHE CAN NOT BE PUT TO SLEEP BECAUSE OF HER LUNG PROBLEMS  . Carotid artery occlusion   . Anemia   . CHF (congestive heart failure)   . Abdominal aneurysm     "hasn't been repaired" (10/20/2013)  . Pneumonia X ~ 2  . On home oxygen therapy     "4L; 24/7" (10/20/2013)  . GERD (gastroesophageal reflux disease)   . Migraines     "before tubal ligation I had them alot; seldom have one anymore" (10/20/2013)  . Arthritis     "knees mainly" (10/20/2013)  . Bladder cancer 2009  . Atrial fibrillation   . Bell palsy 2014    Past Surgical History  Procedure Laterality Date  . Cystoscopy    . Bladder tumor excision      resection of 5 bladder, and fulguration of 1 small bladder tumor  . Cystoscopy      cold cup bladder biopsy of five tumors, and fulguration of one tumor  . Wedge resection Right 1980's    Remote right lung  . Cystoscopy with biopsy  12/24/2011    Procedure: CYSTOSCOPY WITH BIOPSY;  Surgeon: Fredricka Bonine, MD;  Location: [facility];  Service: Urology;  Laterality: N/A;  . Tubal ligation Bilateral 1980's  . Cataract extraction w/ intraocular lens  implant, bilateral Bilateral ~ 2011  . Cardiac catheterization  "several"    "too bad to put stents in"    History   Social History  . Marital Status: Married    Spouse Name: N/A    Number of Children: 2  . Years of Education: N/A   Occupational History  . Retired     Social History Main Topics  . Smoking status: Former Smoker -- 2.00 packs/day for 50 years     Types: Cigarettes    Quit date: 05/18/2011  . Smokeless tobacco: Never Used  . Alcohol Use: No  . Drug Use: No  . Sexual Activity: No   Other Topics Concern  . Not on file   Social History Narrative   Lives in [place], [place] with husband.     ROS: no fevers or chills, productive cough,  hemoptysis, dysphasia, odynophagia, melena, hematochezia, dysuria, hematuria, rash, seizure activity, orthopnea, PND, pedal edema, claudication. Remaining systems are negative.  Physical Exam: Well-developed chronically ill appearing in no acute distress.  Skin is warm and dry.  HEENT is normal.  Neck is supple.  Chest with diminished BS throughout Cardiovascular exam is regular rate and rhythm.  Abdominal exam nontender or distended. No masses palpated. Extremities show no edema. neuro grossly intact  ECG 11/11/2013-sinus rhythm, right bundle branch block.

## 2013-12-14 ENCOUNTER — Ambulatory Visit (INDEPENDENT_AMBULATORY_CARE_PROVIDER_SITE_OTHER): Payer: Medicare Other | Admitting: Cardiology

## 2013-12-14 ENCOUNTER — Encounter: Payer: Self-pay | Admitting: Cardiology

## 2013-12-14 VITALS — BP 134/88 | HR 95 | Ht 60.0 in | Wt 175.0 lb

## 2013-12-14 DIAGNOSIS — I5032 Chronic diastolic (congestive) heart failure: Secondary | ICD-10-CM

## 2013-12-14 DIAGNOSIS — I2510 Atherosclerotic heart disease of native coronary artery without angina pectoris: Secondary | ICD-10-CM

## 2013-12-14 DIAGNOSIS — I472 Ventricular tachycardia: Secondary | ICD-10-CM

## 2013-12-14 DIAGNOSIS — I4729 Other ventricular tachycardia: Secondary | ICD-10-CM

## 2013-12-14 DIAGNOSIS — I10 Essential (primary) hypertension: Secondary | ICD-10-CM

## 2013-12-14 DIAGNOSIS — I714 Abdominal aortic aneurysm, without rupture, unspecified: Secondary | ICD-10-CM

## 2013-12-14 DIAGNOSIS — I5042 Chronic combined systolic (congestive) and diastolic (congestive) heart failure: Secondary | ICD-10-CM | POA: Insufficient documentation

## 2013-12-14 DIAGNOSIS — I679 Cerebrovascular disease, unspecified: Secondary | ICD-10-CM

## 2013-12-14 NOTE — Patient Instructions (Signed)
Your physician recommends that you schedule a follow-up appointment in: 3 MONTHS WITH DR CRENSHAW  Your physician recommends that you HAVE LAB WORK TODAY  

## 2013-12-14 NOTE — Assessment & Plan Note (Signed)
Plan repeat ultrasound in December 2015. She would be high risk for surgical repair.

## 2013-12-14 NOTE — Assessment & Plan Note (Signed)
Continue aspirin and statin. Followed by vascular surgery. 

## 2013-12-14 NOTE — Assessment & Plan Note (Signed)
Continue aspirin and statin. 

## 2013-12-14 NOTE — Assessment & Plan Note (Signed)
Patient recently seen for volume excess. She appears to be euvolemic on examination. Continue present dose of Lasix. Check potassium and renal function. Patient educated on low sodium diet. She will take an additional 20 mg of Lasix daily for weight gain of 2 pounds.

## 2013-12-14 NOTE — Assessment & Plan Note (Signed)
Continue statin. 

## 2013-12-14 NOTE — Assessment & Plan Note (Signed)
Continue amiodarone. Patient has had a recent chest x-ray and liver functions. Check TSH.

## 2013-12-14 NOTE — Assessment & Plan Note (Signed)
Blood pressure controlled. Continue present medications. 

## 2013-12-14 NOTE — Assessment & Plan Note (Signed)
Continue amiodarone. 

## 2013-12-21 ENCOUNTER — Encounter: Payer: Self-pay | Admitting: Cardiology

## 2013-12-21 NOTE — Telephone Encounter (Signed)
This encounter was created in error - please disregard.

## 2013-12-21 NOTE — Telephone Encounter (Signed)
Please call,wants to know if her lab results are back.

## 2013-12-27 ENCOUNTER — Encounter: Payer: Self-pay | Admitting: Cardiology

## 2013-12-27 NOTE — Telephone Encounter (Signed)
Pt called in wanting to speak to Hilda Blades about some blood work she had done. She would like to know if those results were in yet. Please call  thanks

## 2013-12-27 NOTE — Telephone Encounter (Signed)
Pt states she had lab at Dr Murrell Redden office  1 and 1/2 weeks ago. She is asking if Dr Stanford Breed has received a copy of the results. She would like Debra to call her and let her know.

## 2013-12-29 ENCOUNTER — Ambulatory Visit (INDEPENDENT_AMBULATORY_CARE_PROVIDER_SITE_OTHER): Payer: Medicare Other | Admitting: Internal Medicine

## 2013-12-29 ENCOUNTER — Other Ambulatory Visit (INDEPENDENT_AMBULATORY_CARE_PROVIDER_SITE_OTHER): Payer: Medicare Other

## 2013-12-29 ENCOUNTER — Encounter: Payer: Self-pay | Admitting: Internal Medicine

## 2013-12-29 VITALS — BP 130/82 | HR 80 | Ht 65.0 in | Wt 179.0 lb

## 2013-12-29 DIAGNOSIS — R0602 Shortness of breath: Secondary | ICD-10-CM

## 2013-12-29 DIAGNOSIS — J449 Chronic obstructive pulmonary disease, unspecified: Secondary | ICD-10-CM

## 2013-12-29 DIAGNOSIS — J9611 Chronic respiratory failure with hypoxia: Secondary | ICD-10-CM

## 2013-12-29 LAB — CBC WITH DIFFERENTIAL/PLATELET
BASOS ABS: 0 10*3/uL (ref 0.0–0.1)
Basophils Relative: 0.2 % (ref 0.0–3.0)
EOS PCT: 0.3 % (ref 0.0–5.0)
Eosinophils Absolute: 0 10*3/uL (ref 0.0–0.7)
HCT: 36.4 % (ref 36.0–46.0)
Hemoglobin: 11 g/dL — ABNORMAL LOW (ref 12.0–15.0)
LYMPHS PCT: 10.2 % — AB (ref 12.0–46.0)
Lymphs Abs: 1.5 10*3/uL (ref 0.7–4.0)
MCHC: 30.1 g/dL (ref 30.0–36.0)
MCV: 79 fl (ref 78.0–100.0)
MONOS PCT: 8.5 % (ref 3.0–12.0)
Monocytes Absolute: 1.2 10*3/uL — ABNORMAL HIGH (ref 0.1–1.0)
Neutro Abs: 11.8 10*3/uL — ABNORMAL HIGH (ref 1.4–7.7)
Neutrophils Relative %: 80.8 % — ABNORMAL HIGH (ref 43.0–77.0)
PLATELETS: 292 10*3/uL (ref 150.0–400.0)
RBC: 4.61 Mil/uL (ref 3.87–5.11)
RDW: 19.1 % — ABNORMAL HIGH (ref 11.5–15.5)
WBC: 14.6 10*3/uL — AB (ref 4.0–10.5)

## 2013-12-29 LAB — BASIC METABOLIC PANEL
BUN: 12 mg/dL (ref 6–23)
CO2: 29 mEq/L (ref 19–32)
CREATININE: 0.7 mg/dL (ref 0.4–1.2)
Calcium: 9.5 mg/dL (ref 8.4–10.5)
Chloride: 97 mEq/L (ref 96–112)
GFR: 88.78 mL/min (ref >60.00)
Glucose, Bld: 78 mg/dL (ref 70–99)
POTASSIUM: 3.8 meq/L (ref 3.5–5.1)
Sodium: 141 mEq/L (ref 135–145)

## 2013-12-29 LAB — BRAIN NATRIURETIC PEPTIDE: Pro B Natriuretic peptide (BNP): 195 pg/mL — ABNORMAL HIGH (ref 0.0–100.0)

## 2013-12-29 NOTE — Telephone Encounter (Signed)
This encounter was created in error - please disregard.

## 2013-12-29 NOTE — Patient Instructions (Addendum)
Call and let us know who is delivering your performist   Please remember to go to the lab  department downstairs for your tests - we will call you with the results when they are available.  Please schedule a follow up visit in 3 months but call sooner if needed  Add dme to have 02 titration done and ? What can be done for water aerobics?

## 2013-12-30 NOTE — Assessment & Plan Note (Signed)
-  Walking sats January 25, 2010 > desat even on 2lpm   -10/25/2011  Sat 90% at rest >  Walked 2lpm x one lap @ 185 stopped due to  Sob/ fatigue but no desat so changed to ok to leave off at rest -05/26/2012   Walked 4lp  x one half lap = 90 ft,  stopped due to  Fatigue, no desat  -12/14/2012   Walked 2lpm x one lap @ 185 stopped due to  desat  To 85%  rec 2lpm at rest, 4lpm with exertion as of 12/14/12 > ok to titrate x at hs as of 10/29/13    I had an extended discussion with the patient today lasting 15 to 20 minutes of a 25 minute visit on the following issues:  She and daughter misunderstood how to use 02 - taking it off with ex and then putting her on 4lpm afterwards and " it runs it real fast that way" rec only use the 4lpm during ex then resume 2lpm

## 2013-12-30 NOTE — Progress Notes (Signed)
Subjective:    Patient ID: [name] [name], female    DOB: November 25, 1940    MRN: [id number]  Brief patient profile:  53 yowf quit smoking  04/2011 dx'd with stage III COPD in 2004 and GOLD IV in 2014   HPI 07/18/2011 f/u ov/Jaxxen Voong cc w/c bound due to weakness and 02 3lpm 24 hours per day. No cough, confused with meds/ names (only used saba so far on day of ov, not yet gotten around to symbicort) but seems to be getting along ok at this point.   rec Symbicort Take 2 puffs first thing in am and then another 2 puffs about 12 hours later.  Work on inhaler technique:   .    Only use your albuterol as a rescue medication   05/26/2012 f/u ov/Israa Caban cc Chief Complaint  Patient presents with  . COPD    Breathing is unchanged.   doe x room to room even on 4lpm and self titrating 02 at other times rec No change rx Use med calendar      08/24/2012 f/u ov/Zelma Mazariego re copd/ using med calendar better in terms of maint but not prns Chief Complaint  Patient presents with  . 3 month follow up    increased SOB all the time - worsened over the past 2 wks.  Also has wheezing, chest tightness/chest pain, and prod cough with white mucus.    no def change in pain or cough, just the breathing, was fine on 6/27, much worse since assoc with hoarseness and ? Overt HB  >PPI and pepcid   09/07/2012 Follow up and Med review  Returns for follow up and med review .  - pt brought all meds with her today.  We reviewed all her medications and organized them into a medication calendar with patient education. Patient continues to complain of difficulty using her Symbicort inhaler. We discussed switching Symbicort over to, nebulized medications. Patient also had difficulty with Pepcid and has responded to switch over to Zantac with improvement. Patient denies any hemoptysis, orthopnea, PND, or leg swelling. Rec Follow med calendar closely and bring to each visit.  Switch Symbicort to Budesonide/Brovana Neb Twice daily     12/14/2012 f/u ov/Emmanuel Ercole re: copd GOLD III with reversibility/ chronic 02 dep Chief Complaint  Patient presents with  . Follow-up    PFT done today.  Still having SOB    Not following med calendar, no better on "the last neb you gave me" so back on symbicort, Not using 02 as rec Sob x 50 ft on 2lpm (was supposed to be on 3lpm per med calendar >Increase 02 to 4lpm with exertion but ok to use 2lpm at rest and sleeping   01/01/2013 Follow up and Med Review  Hx of COPD GOLD III with reversibility/ chronic 02 dep. Returns for follow up and med review .  Pt brought all meds with her today.  We reviewed all her medications and organized them into a medication calendar with patient education. Having more trouble with portable concentrator not charging.  Feels breathing is doing better on Symbicort.  We discussed pulmonary rehab, she agreed to referral.  No chest pain , orthopnea, edema, abd pain , n/v/d.  >>no changes   05/10/2013 Follow up  Returns for a  4 month follow up COPD -  Reports having increased SOB, occ chest tightness x2 months with prod cough with thick white mucus.  These symptoms wax and wane. Over all feels about the same with no sign  changes in her baseline  Discussed pulmonary rehab referral and she is open to this.  No chest pain, orthopnea, edema or fever.  >>no changes   09/07/2013 Follow up COPD  MW pt here for 4 month COPD follow up.  Reports increased SOB x2-91months with wheezing, tightness, prod cough with white mucus, nausea.   Worse for last 1 week.  Denies f/c/s/v/d, hemoptysis, PND, leg swelling. On Symbicort and Atrovent Neb Four times a day   On O2 at 4l/m .  rec Doxycycline 100mg  Twice daily  For 7 days  Prednisone taper over next week.  Mucinex DM Twice daily  As needed  Cough/congestion    Admission date: 10/20/2013   Discharge Date: 10/22/2013  Discharge Diagnosis  :  Acute on chronic respiratory failure  COPD GOLD III 02 dep  CAD  Acute  exacerbation of chronic obstructive pulmonary disease (COPD)  Anxiety disorder  History of ventricular tachycardia  History of present illness and Hospital Course:  Kindly see H&P for history of present illness and admission details, please review complete Labs, Consult reports and Test reports for all details in brief  HPI from the history and physical done on the day of admission  [name] [name] is a 73 y.o. female with a past medical history of COPD, coronary artery disease on medical management, history of ventricular tachycardia on amiodarone, who was in her usual state of health till about 2-3 days pta, when she started noticing that her breathing was getting worse. This was mainly with exertion. She has chronic shortness of breath, but this has been getting worse. She denies any cough, no fever. Has been taking her medications as prescribed. Denies any chest pain. She had noticed that when she would exert she would feel like she was going to pass out, but denies any syncopal episode. Has had some nausea, chronically, but denies any vomiting. Denies any abdominal pain. No recent travel anywhere. She is on home oxygen at 4 L per minute. Denies any leg swelling. No history suggestive of orthopnea or PND.  Hospital Course  #1 Acute on chronic hypoxic respiratory failure with acute COPD exacerbation: Has chronic respiratory failure with advanced underlying COPD, uses 4lit/min nasal cannula oxygen at home. Prudent at baseline after IV Solu-Medrol, schedule an as needed nebulizer treatments along with supplemental oxygen and oral doxycycline. No pulse ox 100% on 4 L nasal cannula and she is symptom-free he could go home, will be discharged home on oral steroid taper and 5 more days of oral doxycycline with outpatient followup with PCP and her pulmonologist.  #2. Abnormal EKG: Right bundle branch block with some intraventricular conduction delay, she was completely symptom-free, these changes are chronic  although slightly more pronounced initially during admission. Negative Troponin x3 sets. She had a cardiac catheterization in 2012, which revealed high-grade stenosis of the LAD, and high grade calcified nodule involving the proximal circumflex artery. She was not considered a good candidate for intervention at that time. She is symptom-free and chest pain-free continue medical management with aspirin, Plavix, statin, Imdur, Cardizem. She's not on beta blocker due to advanced COPD. have requested her to follow with her primary cardiologist post discharge. She is completely symptom-free.  #3 History of coronary artery disease, on medical management: Continue with her home medications as in #2 above, She's not on beta blocker, presumably due to her severe lung disease.  #4 History of ventricular tachycardia: Continue with amiodarone. Monitor on telemetry, last EF 60% on echogram and 2013.  10/29/2013 post hosp f/u ov/Getsemani Lindon re: weak p admit / 02 3lpm  Chief Complaint  Patient presents with  . HFU  6 x 5mg  prednisone this am tapering off "always seems to help but wears off after a week.  No purulent sputum continues with congested cough ? Has flutter  On symbicort but hfa very poor, has neb. No sob at rest or noct symptoms >>change to pulmicort /perforomist   11/18/2013 Follow up and med review  Pt returns for follow up and med review .  Reviewed all her meds and organized them into a med calendar . Appears to be taking meds correctly except did not increase lasix as recommended to 40mg  -was afraid this was too much . We discussed her disease process and cardiology recommendations for diuretics.  Was changed to perforomist/budesonide last ov , unable to determine if any better or not. No flare of cough or wheezing Is chronically dyspenic. Had CHF flare last week with ER visit , BNP elevated ~600 , increased lasix which helped.  No ankle edema. Wt is down 4l/bs  Is with family member today, feels she  is going down hill over last year.  rec No change rx   12/29/2013 f/u ov/Jennilee Demarco re: GOLDIV copd/ 02 dep resp failure Chief Complaint  Patient presents with  . Follow-up    Pt states her breathing is no better and she is having increased weakness.      Not able to ex s 02 - the only type she can tolerate is water aerobics and for now able to use her 02 in the water   No obvious day to day or daytime variabilty or assoc chronic cough or cp or chest tightness, subjective wheeze overt sinus or hb symptoms. No unusual exp hx or h/o childhood pna/ asthma or knowledge of premature birth.  Sleeping ok without nocturnal  or early am exacerbation  of respiratory  c/o's or need for noct saba. Also denies any obvious fluctuation of symptoms with weather or environmental changes or other aggravating or alleviating factors except as outlined above         Current Medications, Allergies, Complete Past Medical History, Past Surgical History, Family History, and Social History were reviewed in [employer] record.  ROS  The following are not active complaints unless bolded sore throat, dysphagia, dental problems, itching, sneezing,  nasal congestion or excess/ purulent secretions, ear ache,   fever, chills, sweats, unintended wt loss, pleuritic or exertional cp, hemoptysis,  orthopnea pnd or leg swelling, presyncope, palpitations, heartburn, abdominal pain, anorexia, nausea, vomiting, diarrhea  or change in bowel or urinary habits, change in stools or urine, dysuria,hematuria,  rash, arthralgias, visual complaints, headache, numbness weakness or ataxia or problems with walking or coordination,  change in mood/affect or memory.                    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)  HYPERLIPIDEMIA (ICD-272.4)  HYPERTENSION (ICD-401.9)  CEREBROVASCULAR DISEASE (ICD-437.9)  - 80% R Carotid stenosis - see note by Bradham11/16/11  GLAUCOMA (ICD-365.9)   OBESITY (ICD-278.00)  COPD (ICD-496)  - PFT's 12/10/2002 FEV1 .85(37%) ratio 47 and 11 % better p B2, DLC0 42%  - Walking sats January 25, 2010 > desat even on 2lpm > rec 2.5 lpm 24 h per day  -Med Calendar 01/01/2013 , 11/18/2013       Objective:   Physical Exam    GEN: elderly w/c bound, chronically ill appearing  In wheelchair , tearful   HEENT mild turbinate edema.  Oropharynx no thrush or excess pnd or cobblestoning.  No JVD or cervical adenopathy. Mild accessory muscle hypertrophy. Trachea midline, nl thryroid. Chest was hyperinflated by percussion with diminished breath sounds and moderate increased exp time without wheeze. Hoover sign positive at mid inspiration. Regular rate and rhythm without murmur gallop or rub or increase P2 or edema.  Abd: no hsm, nl excursion. Ext warm without cyanosis or clubbing. Skin with classic steroid ecchymoses both UEs             11/11/13 cxr COPD with superimposed low grade CHF. There is no evidence of pneumonia.   Recent Labs Lab 12/29/13 1238  NA 141  K 3.8  CL 97  CO2 29  BUN 12  CREATININE 0.7  GLUCOSE 78    Recent Labs Lab 12/29/13 1238  HGB 11.0*  HCT 36.4  WBC 14.6*  PLT 292.0     Lab Results  Component Value Date   TSH 2.12 06/10/2013     Lab Results  Component Value Date   PROBNP 195.0* 12/29/2013           Assessment & Plan:

## 2013-12-30 NOTE — Assessment & Plan Note (Signed)
-   PFT's 12/10/2002 FEV1 .85(37%) ratio 47 and 11 % better p B2, DLC0 42%  - PFTs 12/14/2012  FEV1 0.49 (21%) improves by 31%  - Rehab rec 05/28/12 > pt declined - HFA 50% p coaching 08/24/2012  - med calendar 02/10/12 , 09/07/2012 , 01/01/2013 -changed symbicort to neb B/B  09/07/12 but worse so back to symbicort > retried 10/30/2013 as very poor hfa  -refer pulmonary rehab  05/10/2013 > pt terminated 08/02/13 as did not appear to be motivated per staff  DDX of  difficult airways management all start with A and  include Adherence, Ace Inhibitors, Acid Reflux, Active Sinus Disease, Alpha 1 Antitripsin deficiency, Anxiety masquerading as Airways dz,  ABPA,  allergy(esp in young), Aspiration (esp in elderly), Adverse effects of DPI,  Active smokers, plus two Bs  = Bronchiectasis and Beta blocker use..and one C= CHF  Adherence is always the initial "prime suspect" and is a multilayered concern that requires a "trust but verify" approach in every patient - starting with knowing how to use medications, especially inhalers, correctly, keeping up with refills and understanding the fundamental difference between maintenance and prns vs those medications only taken for a very short course and then stopped and not refilled.  - appears to be using med calendar well - may not be able to afford her neb meds, have asked her to let me know what company is providing them as they should be completely covered under med B and supplement  ? Anxiety / depression > usually a dx of exclusion but much higher on her list   ?? Acid (or non-acid) GERD > always difficult to exclude as up to 75% of pts in some series report no assoc GI/ Heartburn symptoms> rec continue max (24h)  acid suppression and diet restrictions/ reviewed         ? Chf> appears better compensated now with bnp down    Each maintenance medication was reviewed in detail including most importantly the difference between maintenance and as needed and under what  circumstances the prns are to be used. This was done in the context of a medication calendar review which provided the patient with a user-friendly unambiguous mechanism for medication administration and reconciliation and provides an action plan for all active problems. It is critical that this be shown to every doctor  for modification during the office visit if necessary so the patient can use it as a working document.

## 2013-12-31 ENCOUNTER — Other Ambulatory Visit: Payer: Self-pay | Admitting: Cardiology

## 2014-01-03 ENCOUNTER — Telehealth: Payer: Self-pay | Admitting: Internal Medicine

## 2014-01-03 NOTE — Telephone Encounter (Signed)
lmomtcb x1 for [name] [name]

## 2014-01-03 NOTE — Telephone Encounter (Signed)
Rx refill sent to patient pharmacy   

## 2014-01-04 NOTE — Telephone Encounter (Signed)
Dr Melvyn Novas, did you mean to order water aerobics for this pt? Please advise thanks!

## 2014-01-04 NOTE — Telephone Encounter (Signed)
Discussed with DME > if she wants to do aerobics they suggest just using  longer tubing which is fine with me

## 2014-01-04 NOTE — Telephone Encounter (Signed)
Nothing further needed 

## 2014-01-04 NOTE — Telephone Encounter (Signed)
Spoke with pt regarding the water aerobics.  Pt states that she does not recall telling Dr Melvyn Novas that she can do water aerobics because "she cant even swim".  I asked pt if she was able to do any form of exercise and she said "not really".  Please advise if I need to do anything further regarding her oxygen.

## 2014-01-07 ENCOUNTER — Other Ambulatory Visit: Payer: Self-pay | Admitting: Internal Medicine

## 2014-01-28 ENCOUNTER — Other Ambulatory Visit (HOSPITAL_COMMUNITY): Payer: Self-pay | Admitting: *Deleted

## 2014-01-28 DIAGNOSIS — I714 Abdominal aortic aneurysm, without rupture, unspecified: Secondary | ICD-10-CM

## 2014-02-05 ENCOUNTER — Other Ambulatory Visit: Payer: Self-pay | Admitting: Cardiology

## 2014-02-14 ENCOUNTER — Ambulatory Visit (HOSPITAL_COMMUNITY): Payer: Medicare Other | Attending: Cardiovascular Disease | Admitting: Cardiology

## 2014-02-14 DIAGNOSIS — I714 Abdominal aortic aneurysm, without rupture, unspecified: Secondary | ICD-10-CM

## 2014-02-14 DIAGNOSIS — Z87891 Personal history of nicotine dependence: Secondary | ICD-10-CM | POA: Diagnosis not present

## 2014-02-14 DIAGNOSIS — I2510 Atherosclerotic heart disease of native coronary artery without angina pectoris: Secondary | ICD-10-CM | POA: Diagnosis not present

## 2014-02-14 DIAGNOSIS — E785 Hyperlipidemia, unspecified: Secondary | ICD-10-CM | POA: Diagnosis not present

## 2014-02-14 DIAGNOSIS — J449 Chronic obstructive pulmonary disease, unspecified: Secondary | ICD-10-CM | POA: Diagnosis not present

## 2014-02-14 DIAGNOSIS — I10 Essential (primary) hypertension: Secondary | ICD-10-CM | POA: Diagnosis not present

## 2014-02-14 DIAGNOSIS — I700 Atherosclerosis of aorta: Secondary | ICD-10-CM

## 2014-02-14 NOTE — Progress Notes (Signed)
Abdominal aorta duplex performed  

## 2014-02-28 ENCOUNTER — Telehealth: Payer: Self-pay | Admitting: Cardiology

## 2014-02-28 NOTE — Telephone Encounter (Signed)
Closed encounter °

## 2014-03-05 ENCOUNTER — Other Ambulatory Visit: Payer: Self-pay | Admitting: Internal Medicine

## 2014-03-14 ENCOUNTER — Other Ambulatory Visit: Payer: Self-pay | Admitting: Internal Medicine

## 2014-03-16 NOTE — Progress Notes (Signed)
HPI: FU CAD, hypertension, hyperlipidemia, SVT, AAA, COPD, bladder cancer and cerebrovascular disease. She presented to [facility] in early February 2012 for a COPD exacerbation and possible diastolic heart failure. She was noted to have wide complex tachycardia that was felt to be ventricular tachycardia and she was transferred to [facility]. Cardiac catheterization on 04/06/10 demonstrated a totally occluded LAD with right to left collaterals, a nodular density in the prox CFX that occupied about 70% of the prox vessel, and preserved LVF. It was felt that her CAD should be treated medically. She was evaluated by Dr. Lovena Le. It was felt that her ventricular tachycardia would also be treated medically with adjustments in her beta blocker therapy. The patient has previous attempt at carotid stent on the left. The procedure was unsuccessful due to anatomy; followed by vascular surgery. Repeat echocardiogram in March of 2013 showed normal LV function. There was trace aortic insufficiency and mild left atrial enlargement. Patient also on amiodarone for h/o SVT. Ultrasound 12/15 revealed 4.3 x 3.8 cm AAA; fu recommended six months. Since last seen,   Current Outpatient Prescriptions  Medication Sig Dispense Refill  . acetaminophen-codeine (TYLENOL #3) 300-30 MG per tablet Take 1 tablet by mouth every 8 (eight) hours as needed for moderate pain.     Marland Kitchen amiodarone (PACERONE) 200 MG tablet Take 200 mg by mouth at bedtime.     Marland Kitchen aspirin EC 81 MG tablet Take 81 mg by mouth at bedtime.    . budesonide (PULMICORT) 0.25 MG/2ML nebulizer solution 2 ml twice daily with perforomist 120 mL 12  . clonazePAM (KLONOPIN) 0.5 MG tablet 1/2-1 every 6 hours as needed for anxiety    . clopidogrel (PLAVIX) 75 MG tablet Take 1 tablet (75 mg total) by mouth at bedtime.    Marland Kitchen Dextromethorphan-Guaifenesin ([place] FAST-MAX DM MAX) 5-100 MG/5ML LIQD Take 5 mLs by mouth every 12 (twelve) hours as needed (for  congestion).     Marland Kitchen diltiazem (CARDIZEM CD) 300 MG 24 hr capsule Take 300 mg by mouth daily.    Marland Kitchen diltiazem (CARDIZEM CD) 300 MG 24 hr capsule TAKE 1 CAPSULE IN THE EVENING. 30 capsule 1  . furosemide (LASIX) 20 MG tablet Take 2 tablets (40 mg total) by mouth daily. 60 tablet 12  . ipratropium (ATROVENT) 0.02 % nebulizer solution Take 0.5 mg by nebulization 4 (four) times daily.     Marland Kitchen ipratropium (ATROVENT) 0.02 % nebulizer solution USE 1 VIAL IN NEBULIZER EVERY 6 HOURS. 300 mL 1  . isosorbide mononitrate (IMDUR) 30 MG 24 hr tablet Take 30 mg by mouth daily.    . isosorbide mononitrate (IMDUR) 30 MG 24 hr tablet TAKE ONE TABLET BY MOUTH AT BEDTIME. 30 tablet 5  . levalbuterol (XOPENEX HFA) 45 MCG/ACT inhaler Inhale 2 puffs into the lungs every 4 (four) hours as needed for wheezing. 15 g 6  . nitroGLYCERIN (NITROSTAT) 0.4 MG SL tablet Place 0.4 mg under the tongue every 5 (five) minutes as needed for chest pain (may repeat x3). Chest pain    . pantoprazole (PROTONIX) 40 MG tablet Take 40 mg by mouth daily.    . pantoprazole (PROTONIX) 40 MG tablet TAKE 1 TABLET BY MOUTH ONCE DAILY 30-60 MINTUES BEFORE FIRST MEAL OF THE DAY. 30 tablet 3  . potassium chloride SA (K-DUR,KLOR-CON) 20 MEQ tablet 2 tabs by mouth once daily    . pravastatin (PRAVACHOL) 80 MG tablet Take 80 mg by mouth at bedtime.    Marland Kitchen  predniSONE (DELTASONE) 20 MG tablet Take 10 mg by mouth daily with breakfast.    . promethazine (PHENERGAN) 25 MG tablet Take 25 mg by mouth every 4 (four) hours as needed for nausea.     . ranitidine (ZANTAC) 150 MG capsule Take 150 mg by mouth at bedtime.    . Travoprost, BAK Free, (TRAVATAN) 0.004 % SOLN ophthalmic solution Place 1 drop into both eyes at bedtime.     No current facility-administered medications for this visit.   Facility-Administered Medications Ordered in Other Visits  Medication Dose Route Frequency Provider Last Rate Last Dose  . mitomycin (MUTAMYCIN) chemo injection 40 mg  40 mg  Bladder Instillation Once Festus Aloe, MD         Past Medical History  Diagnosis Date  . Hypertension   . Hyperlipidemia   . Cerebrovascular disease, unspecified   . Unspecified glaucoma   . Obesity, unspecified   . Coronary artery disease     cath 04/06/10: LAD occluded with R-L collats; med Rx WTC....probable V. Tach...med Rx  . Ventricular tachycardia   . SVT (supraventricular tachycardia)   . AAA (abdominal aortic aneurysm)   . COPD (chronic obstructive pulmonary disease) 2013    [place] of breath     WITH ACTIVITY--USES OXYGEN AS NEEDED  2&1/2 L PER NASAL CANNUL  . Complication of anesthesia     PT STATES SHE CAN NOT BE PUT TO SLEEP BECAUSE OF HER LUNG PROBLEMS  . Carotid artery occlusion   . Anemia   . CHF (congestive heart failure)   . Abdominal aneurysm     "hasn't been repaired" (10/20/2013)  . Pneumonia X ~ 2  . On home oxygen therapy     "4L; 24/7" (10/20/2013)  . GERD (gastroesophageal reflux disease)   . Migraines     "before tubal ligation I had them alot; seldom have one anymore" (10/20/2013)  . Arthritis     "knees mainly" (10/20/2013)  . Bladder cancer 2009  . Atrial fibrillation   . Bell palsy 2014    Past Surgical History  Procedure Laterality Date  . Cystoscopy    . Bladder tumor excision      resection of 5 bladder, and fulguration of 1 small bladder tumor  . Cystoscopy      cold cup bladder biopsy of five tumors, and fulguration of one tumor  . Wedge resection Right 1980's    Remote right lung  . Cystoscopy with biopsy  12/24/2011    Procedure: CYSTOSCOPY WITH BIOPSY;  Surgeon: Fredricka Bonine, MD;  Location: [facility];  Service: Urology;  Laterality: N/A;  . Tubal ligation Bilateral 1980's  . Cataract extraction w/ intraocular lens  implant, bilateral Bilateral ~ 2011  . Cardiac catheterization  "several"    "too bad to put stents in"    History   Social History  . Marital Status: Married    Spouse Name: N/A      Number of Children: 2  . Years of Education: N/A   Occupational History  . Retired     Social History Main Topics  . Smoking status: Former Smoker -- 2.00 packs/day for 50 years    Types: Cigarettes    Quit date: 05/18/2011  . Smokeless tobacco: Never Used  . Alcohol Use: No  . Drug Use: No  . Sexual Activity: No   Other Topics Concern  . Not on file   Social History Narrative   Lives in [place], [place] with  husband.     ROS: no fevers or chills, productive cough, hemoptysis, dysphasia, odynophagia, melena, hematochezia, dysuria, hematuria, rash, seizure activity, orthopnea, PND, pedal edema, claudication. Remaining systems are negative.  Physical Exam: Well-developed well-nourished in no acute distress.  Skin is warm and dry.  HEENT is normal.  Neck is supple.  Chest is clear to auscultation with normal expansion.  Cardiovascular exam is regular rate and rhythm.  Abdominal exam nontender or distended. No masses palpated. Extremities show no edema. neuro grossly intact  ECG     This encounter was created in error - please disregard.

## 2014-03-21 ENCOUNTER — Encounter: Payer: Medicare Other | Admitting: Cardiology

## 2014-04-14 NOTE — Progress Notes (Signed)
HPI: FU CAD, hypertension, hyperlipidemia, SVT, AAA, COPD, bladder cancer and cerebrovascular disease. She presented to [facility] in early February 2012 for a COPD exacerbation and possible diastolic heart failure. She was noted to have wide complex tachycardia that was felt to be ventricular tachycardia and she was transferred to [facility]. Cardiac catheterization on 04/06/10 demonstrated a totally occluded LAD with right to left collaterals, a nodular density in the prox CFX that occupied about 70% of the prox vessel, and preserved LVF. It was felt that her CAD should be treated medically. She was evaluated by Dr. Lovena Le. It was felt that her ventricular tachycardia would also be treated medically with adjustments in her beta blocker therapy. The patient has previous attempt at carotid stent on the left. The procedure was unsuccessful due to anatomy; followed by vascular surgery. Repeat echocardiogram in March of 2013 showed normal LV function. There was trace aortic insufficiency and mild left atrial enlargement. Patient also on amiodarone for h/o SVT. Ultrasound 12/15 revealed 4.3 x 3.8 cm AAA. Since last seen, she states her dyspnea has worsened over the past 1 week. She has severe dyspnea with any activity and some dyspnea at rest. No fevers, chills or productive cough. No hemoptysis. She denies orthopnea or PND but she does have increased weight. She was told by her primary care recently to increase Gatorade which made her worse.  Current Outpatient Prescriptions  Medication Sig Dispense Refill  . acetaminophen-codeine (TYLENOL #3) 300-30 MG per tablet Take 1 tablet by mouth every 8 (eight) hours as needed for moderate pain.     Marland Kitchen amiodarone (PACERONE) 200 MG tablet Take 200 mg by mouth at bedtime.     Marland Kitchen aspirin EC 81 MG tablet Take 81 mg by mouth at bedtime.    . budesonide (PULMICORT) 0.25 MG/2ML nebulizer solution 2 ml twice daily with perforomist 120 mL 12  . clonazePAM  (KLONOPIN) 0.5 MG tablet 1/2-1 every 6 hours as needed for anxiety    . clopidogrel (PLAVIX) 75 MG tablet Take 1 tablet (75 mg total) by mouth at bedtime.    Marland Kitchen Dextromethorphan-Guaifenesin ([place] FAST-MAX DM MAX) 5-100 MG/5ML LIQD Take 5 mLs by mouth every 12 (twelve) hours as needed (for congestion).     Marland Kitchen diltiazem (CARDIZEM CD) 300 MG 24 hr capsule Take 300 mg by mouth daily.    Marland Kitchen diltiazem (CARDIZEM CD) 300 MG 24 hr capsule TAKE 1 CAPSULE IN THE EVENING. 30 capsule 1  . furosemide (LASIX) 20 MG tablet Take 2 tablets (40 mg total) by mouth daily. 60 tablet 12  . ipratropium (ATROVENT) 0.02 % nebulizer solution Take 0.5 mg by nebulization 4 (four) times daily.     Marland Kitchen ipratropium (ATROVENT) 0.02 % nebulizer solution USE 1 VIAL IN NEBULIZER EVERY 6 HOURS. 300 mL 1  . isosorbide mononitrate (IMDUR) 30 MG 24 hr tablet Take 30 mg by mouth daily.    . isosorbide mononitrate (IMDUR) 30 MG 24 hr tablet TAKE ONE TABLET BY MOUTH AT BEDTIME. 30 tablet 5  . levalbuterol (XOPENEX HFA) 45 MCG/ACT inhaler Inhale 2 puffs into the lungs every 4 (four) hours as needed for wheezing. 15 g 6  . nitroGLYCERIN (NITROSTAT) 0.4 MG SL tablet Place 0.4 mg under the tongue every 5 (five) minutes as needed for chest pain (may repeat x3). Chest pain    . nystatin (MYCOSTATIN) 100000 UNIT/ML suspension TAKE 1 TEASPOONFUL FOUR TIMES DAILY AS DIRECTED.    Marland Kitchen pantoprazole (PROTONIX) 40  MG tablet Take 40 mg by mouth daily.    . pantoprazole (PROTONIX) 40 MG tablet TAKE 1 TABLET BY MOUTH ONCE DAILY 30-60 MINTUES BEFORE FIRST MEAL OF THE DAY. 30 tablet 3  . potassium chloride SA (K-DUR,KLOR-CON) 20 MEQ tablet 2 tabs by mouth once daily    . pravastatin (PRAVACHOL) 80 MG tablet Take 80 mg by mouth at bedtime.    . predniSONE (DELTASONE) 20 MG tablet Take 10 mg by mouth daily with breakfast.    . promethazine (PHENERGAN) 25 MG tablet Take 25 mg by mouth every 4 (four) hours as needed for nausea.     . ranitidine (ZANTAC) 150 MG  capsule Take 150 mg by mouth at bedtime.    . Travoprost, BAK Free, (TRAVATAN) 0.004 % SOLN ophthalmic solution Place 1 drop into both eyes at bedtime.     No current facility-administered medications for this visit.   Facility-Administered Medications Ordered in Other Visits  Medication Dose Route Frequency Provider Last Rate Last Dose  . mitomycin (MUTAMYCIN) chemo injection 40 mg  40 mg Bladder Instillation Once Festus Aloe, MD         Past Medical History  Diagnosis Date  . Hypertension   . Hyperlipidemia   . Cerebrovascular disease, unspecified   . Unspecified glaucoma   . Obesity, unspecified   . Coronary artery disease     cath 04/06/10: LAD occluded with R-L collats; med Rx WTC....probable V. Tach...med Rx  . Ventricular tachycardia   . SVT (supraventricular tachycardia)   . AAA (abdominal aortic aneurysm)   . COPD (chronic obstructive pulmonary disease) 2013    [place] of breath     WITH ACTIVITY--USES OXYGEN AS NEEDED  2&1/2 L PER NASAL CANNUL  . Complication of anesthesia     PT STATES SHE CAN NOT BE PUT TO SLEEP BECAUSE OF HER LUNG PROBLEMS  . Carotid artery occlusion   . Anemia   . CHF (congestive heart failure)   . Abdominal aneurysm     "hasn't been repaired" (10/20/2013)  . Pneumonia X ~ 2  . On home oxygen therapy     "4L; 24/7" (10/20/2013)  . GERD (gastroesophageal reflux disease)   . Migraines     "before tubal ligation I had them alot; seldom have one anymore" (10/20/2013)  . Arthritis     "knees mainly" (10/20/2013)  . Bladder cancer 2009  . Atrial fibrillation   . Bell palsy 2014    Past Surgical History  Procedure Laterality Date  . Cystoscopy    . Bladder tumor excision      resection of 5 bladder, and fulguration of 1 small bladder tumor  . Cystoscopy      cold cup bladder biopsy of five tumors, and fulguration of one tumor  . Wedge resection Right 1980's    Remote right lung  . Cystoscopy with biopsy  12/24/2011     Procedure: CYSTOSCOPY WITH BIOPSY;  Surgeon: Fredricka Bonine, MD;  Location: [facility];  Service: Urology;  Laterality: N/A;  . Tubal ligation Bilateral 1980's  . Cataract extraction w/ intraocular lens  implant, bilateral Bilateral ~ 2011  . Cardiac catheterization  "several"    "too bad to put stents in"    History   Social History  . Marital Status: Married    Spouse Name: N/A  . Number of Children: 2  . Years of Education: N/A   Occupational History  . Retired     Social History  Main Topics  . Smoking status: Former Smoker -- 2.00 packs/day for 50 years    Types: Cigarettes    Quit date: 05/18/2011  . Smokeless tobacco: Never Used  . Alcohol Use: No  . Drug Use: No  . Sexual Activity: No   Other Topics Concern  . Not on file   Social History Narrative   Lives in [place], [place] with husband.     ROS: significant dyspnea but no fevers or chills, productive cough, hemoptysis, dysphasia, odynophagia, melena, hematochezia, dysuria, hematuria, rash, seizure activity, orthopnea, PND, pedal edema, claudication. Remaining systems are negative.  Physical Exam: Well-developed obese, chronically ill appearing in mild respiratory distress.  Skin is warm and dry.  HEENT is normal.  Neck is supple.  Chest with severely diminished breath sounds throughout Cardiovascular exam is regular rate and rhythm.  Abdominal exam nontender or distended. No masses palpated. Extremities show no edema. neuro grossly intact  ECG sinus rhythm at a rate of 100. Right bundle branch block. Nonspecific T-wave changes.

## 2014-04-15 ENCOUNTER — Ambulatory Visit (INDEPENDENT_AMBULATORY_CARE_PROVIDER_SITE_OTHER)
Admission: RE | Admit: 2014-04-15 | Discharge: 2014-04-15 | Disposition: A | Discharge status: Home / Self Care | Payer: Medicare Other | Source: Ambulatory Visit | Attending: Cardiology | Admitting: Cardiology

## 2014-04-15 ENCOUNTER — Inpatient Hospital Stay (HOSPITAL_COMMUNITY)
Admission: EM | Admit: 2014-04-15 | Discharge: 2014-04-23 | DRG: 981 | Disposition: A | Discharge status: Home Health | Payer: Medicare Other | Attending: Internal Medicine | Admitting: Internal Medicine

## 2014-04-15 ENCOUNTER — Other Ambulatory Visit: Payer: Self-pay | Admitting: Cardiology

## 2014-04-15 ENCOUNTER — Emergency Department (HOSPITAL_COMMUNITY): Payer: Medicare Other

## 2014-04-15 ENCOUNTER — Ambulatory Visit (INDEPENDENT_AMBULATORY_CARE_PROVIDER_SITE_OTHER): Payer: Medicare Other | Admitting: Cardiology

## 2014-04-15 ENCOUNTER — Other Ambulatory Visit (INDEPENDENT_AMBULATORY_CARE_PROVIDER_SITE_OTHER): Payer: Medicare Other

## 2014-04-15 ENCOUNTER — Encounter: Payer: Self-pay | Admitting: Cardiology

## 2014-04-15 ENCOUNTER — Encounter (HOSPITAL_COMMUNITY): Payer: Self-pay | Admitting: *Deleted

## 2014-04-15 VITALS — BP 90/60 | HR 100

## 2014-04-15 DIAGNOSIS — I480 Paroxysmal atrial fibrillation: Secondary | ICD-10-CM | POA: Diagnosis present

## 2014-04-15 DIAGNOSIS — J9620 Acute and chronic respiratory failure, unspecified whether with hypoxia or hypercapnia: Secondary | ICD-10-CM | POA: Diagnosis present

## 2014-04-15 DIAGNOSIS — I10 Essential (primary) hypertension: Secondary | ICD-10-CM | POA: Diagnosis present

## 2014-04-15 DIAGNOSIS — Z8551 Personal history of malignant neoplasm of bladder: Secondary | ICD-10-CM | POA: Diagnosis not present

## 2014-04-15 DIAGNOSIS — M1990 Unspecified osteoarthritis, unspecified site: Secondary | ICD-10-CM | POA: Diagnosis present

## 2014-04-15 DIAGNOSIS — J189 Pneumonia, unspecified organism: Secondary | ICD-10-CM | POA: Diagnosis present

## 2014-04-15 DIAGNOSIS — E785 Hyperlipidemia, unspecified: Secondary | ICD-10-CM | POA: Diagnosis present

## 2014-04-15 DIAGNOSIS — J9602 Acute respiratory failure with hypercapnia: Secondary | ICD-10-CM

## 2014-04-15 DIAGNOSIS — Z6828 Body mass index (BMI) 28.0-28.9, adult: Secondary | ICD-10-CM | POA: Diagnosis not present

## 2014-04-15 DIAGNOSIS — Z888 Allergy status to other drugs, medicaments and biological substances status: Secondary | ICD-10-CM

## 2014-04-15 DIAGNOSIS — I6529 Occlusion and stenosis of unspecified carotid artery: Secondary | ICD-10-CM | POA: Diagnosis present

## 2014-04-15 DIAGNOSIS — H409 Unspecified glaucoma: Secondary | ICD-10-CM | POA: Diagnosis present

## 2014-04-15 DIAGNOSIS — Z881 Allergy status to other antibiotic agents status: Secondary | ICD-10-CM | POA: Diagnosis not present

## 2014-04-15 DIAGNOSIS — Z8249 Family history of ischemic heart disease and other diseases of the circulatory system: Secondary | ICD-10-CM

## 2014-04-15 DIAGNOSIS — I714 Abdominal aortic aneurysm, without rupture: Secondary | ICD-10-CM | POA: Diagnosis present

## 2014-04-15 DIAGNOSIS — I4510 Unspecified right bundle-branch block: Secondary | ICD-10-CM | POA: Diagnosis present

## 2014-04-15 DIAGNOSIS — Z87891 Personal history of nicotine dependence: Secondary | ICD-10-CM

## 2014-04-15 DIAGNOSIS — Z791 Long term (current) use of non-steroidal anti-inflammatories (NSAID): Secondary | ICD-10-CM

## 2014-04-15 DIAGNOSIS — F419 Anxiety disorder, unspecified: Secondary | ICD-10-CM | POA: Diagnosis present

## 2014-04-15 DIAGNOSIS — I214 Non-ST elevation (NSTEMI) myocardial infarction: Secondary | ICD-10-CM | POA: Diagnosis present

## 2014-04-15 DIAGNOSIS — K219 Gastro-esophageal reflux disease without esophagitis: Secondary | ICD-10-CM | POA: Diagnosis present

## 2014-04-15 DIAGNOSIS — R079 Chest pain, unspecified: Secondary | ICD-10-CM

## 2014-04-15 DIAGNOSIS — J9621 Acute and chronic respiratory failure with hypoxia: Secondary | ICD-10-CM | POA: Diagnosis present

## 2014-04-15 DIAGNOSIS — Z79899 Other long term (current) drug therapy: Secondary | ICD-10-CM | POA: Diagnosis not present

## 2014-04-15 DIAGNOSIS — G9341 Metabolic encephalopathy: Secondary | ICD-10-CM | POA: Diagnosis not present

## 2014-04-15 DIAGNOSIS — Z882 Allergy status to sulfonamides status: Secondary | ICD-10-CM | POA: Diagnosis not present

## 2014-04-15 DIAGNOSIS — I5043 Acute on chronic combined systolic (congestive) and diastolic (congestive) heart failure: Secondary | ICD-10-CM | POA: Diagnosis present

## 2014-04-15 DIAGNOSIS — J9622 Acute and chronic respiratory failure with hypercapnia: Secondary | ICD-10-CM | POA: Diagnosis present

## 2014-04-15 DIAGNOSIS — I5042 Chronic combined systolic (congestive) and diastolic (congestive) heart failure: Secondary | ICD-10-CM | POA: Diagnosis present

## 2014-04-15 DIAGNOSIS — I2510 Atherosclerotic heart disease of native coronary artery without angina pectoris: Secondary | ICD-10-CM

## 2014-04-15 DIAGNOSIS — Z9981 Dependence on supplemental oxygen: Secondary | ICD-10-CM | POA: Diagnosis not present

## 2014-04-15 DIAGNOSIS — Z9842 Cataract extraction status, left eye: Secondary | ICD-10-CM | POA: Diagnosis not present

## 2014-04-15 DIAGNOSIS — E876 Hypokalemia: Secondary | ICD-10-CM | POA: Diagnosis not present

## 2014-04-15 DIAGNOSIS — Z7982 Long term (current) use of aspirin: Secondary | ICD-10-CM

## 2014-04-15 DIAGNOSIS — I739 Peripheral vascular disease, unspecified: Secondary | ICD-10-CM | POA: Diagnosis present

## 2014-04-15 DIAGNOSIS — Z961 Presence of intraocular lens: Secondary | ICD-10-CM | POA: Diagnosis present

## 2014-04-15 DIAGNOSIS — Z8673 Personal history of transient ischemic attack (TIA), and cerebral infarction without residual deficits: Secondary | ICD-10-CM

## 2014-04-15 DIAGNOSIS — J441 Chronic obstructive pulmonary disease with (acute) exacerbation: Secondary | ICD-10-CM | POA: Diagnosis present

## 2014-04-15 DIAGNOSIS — E669 Obesity, unspecified: Secondary | ICD-10-CM | POA: Diagnosis present

## 2014-04-15 DIAGNOSIS — Z8679 Personal history of other diseases of the circulatory system: Secondary | ICD-10-CM

## 2014-04-15 DIAGNOSIS — E872 Acidosis: Secondary | ICD-10-CM | POA: Diagnosis present

## 2014-04-15 DIAGNOSIS — I509 Heart failure, unspecified: Secondary | ICD-10-CM

## 2014-04-15 DIAGNOSIS — Z7952 Long term (current) use of systemic steroids: Secondary | ICD-10-CM | POA: Diagnosis not present

## 2014-04-15 DIAGNOSIS — Z9841 Cataract extraction status, right eye: Secondary | ICD-10-CM

## 2014-04-15 DIAGNOSIS — I25110 Atherosclerotic heart disease of native coronary artery with unstable angina pectoris: Secondary | ICD-10-CM | POA: Diagnosis present

## 2014-04-15 DIAGNOSIS — D649 Anemia, unspecified: Secondary | ICD-10-CM | POA: Diagnosis present

## 2014-04-15 DIAGNOSIS — J449 Chronic obstructive pulmonary disease, unspecified: Secondary | ICD-10-CM | POA: Diagnosis present

## 2014-04-15 DIAGNOSIS — R0602 Shortness of breath: Secondary | ICD-10-CM

## 2014-04-15 DIAGNOSIS — R0600 Dyspnea, unspecified: Secondary | ICD-10-CM

## 2014-04-15 DIAGNOSIS — R7989 Other specified abnormal findings of blood chemistry: Secondary | ICD-10-CM

## 2014-04-15 DIAGNOSIS — R778 Other specified abnormalities of plasma proteins: Secondary | ICD-10-CM | POA: Diagnosis present

## 2014-04-15 LAB — BASIC METABOLIC PANEL
ANION GAP: 9 (ref 5–15)
BUN: 9 mg/dL (ref 6–23)
BUN: 12 mg/dL (ref 6–23)
CALCIUM: 9.3 mg/dL (ref 8.4–10.5)
CHLORIDE: 92 mmol/L — AB (ref 96–112)
CO2: 37 mmol/L — ABNORMAL HIGH (ref 19–32)
CO2: 38 mEq/L — ABNORMAL HIGH (ref 19–32)
Calcium: 9.3 mg/dL (ref 8.4–10.5)
Chloride: 94 mEq/L — ABNORMAL LOW (ref 96–112)
Creatinine, Ser: 0.69 mg/dL (ref 0.50–1.10)
Creatinine, Ser: 0.65 mg/dL (ref 0.40–1.20)
GFR calc Af Amer: >90 mL/min (ref >90)
GFR calc non Af Amer: 85 mL/min — ABNORMAL LOW (ref >90)
GFR: 95.04 mL/min (ref >60.00)
GLUCOSE: 103 mg/dL — AB (ref 70–99)
Glucose, Bld: 118 mg/dL — ABNORMAL HIGH (ref 70–99)
Potassium: 3.5 mmol/L (ref 3.5–5.1)
Potassium: 3.5 mEq/L (ref 3.5–5.1)
SODIUM: 138 mmol/L (ref 135–145)
Sodium: 141 mEq/L (ref 135–145)

## 2014-04-15 LAB — CBC WITH DIFFERENTIAL/PLATELET
BASOS ABS: 0 10*3/uL (ref 0.0–0.1)
BASOS ABS: 0 10*3/uL (ref 0.0–0.1)
BASOS PCT: 0 % (ref 0–1)
Basophils Relative: 0.2 % (ref 0.0–3.0)
Eosinophils Absolute: 0 10*3/uL (ref 0.0–0.7)
Eosinophils Absolute: 0 10*3/uL (ref 0.0–0.7)
Eosinophils Relative: 0 % (ref 0–5)
Eosinophils Relative: 0.1 % (ref 0.0–5.0)
HCT: 31.8 % — ABNORMAL LOW (ref 36.0–46.0)
HCT: 36.4 % (ref 36.0–46.0)
HEMOGLOBIN: 10 g/dL — AB (ref 12.0–15.0)
HEMOGLOBIN: 10.2 g/dL — AB (ref 12.0–15.0)
Lymphocytes Relative: 10 % — ABNORMAL LOW (ref 12–46)
Lymphocytes Relative: 7.7 % — ABNORMAL LOW (ref 12.0–46.0)
Lymphs Abs: 1.4 10*3/uL (ref 0.7–4.0)
Lymphs Abs: 1.7 10*3/uL (ref 0.7–4.0)
MCH: 24.1 pg — ABNORMAL LOW (ref 26.0–34.0)
MCHC: 28 g/dL — ABNORMAL LOW (ref 30.0–36.0)
MCHC: 31.5 g/dL (ref 30.0–36.0)
MCV: 79.2 fl (ref 78.0–100.0)
MCV: 85.8 fL (ref 78.0–100.0)
MONOS PCT: 7.9 % (ref 3.0–12.0)
Monocytes Absolute: 1.4 10*3/uL — ABNORMAL HIGH (ref 0.1–1.0)
Monocytes Absolute: 1.6 10*3/uL — ABNORMAL HIGH (ref 0.1–1.0)
Monocytes Relative: 10 % (ref 3–12)
NEUTROS ABS: 13.2 10*3/uL — AB (ref 1.7–7.7)
NEUTROS ABS: 14.9 10*3/uL — AB (ref 1.4–7.7)
NEUTROS PCT: 80 % — AB (ref 43–77)
Neutrophils Relative %: 84.1 % — ABNORMAL HIGH (ref 43.0–77.0)
PLATELETS: 317 10*3/uL (ref 150–400)
PLATELETS: 348 10*3/uL (ref 150.0–400.0)
RBC: 4.01 Mil/uL (ref 3.87–5.11)
RBC: 4.24 MIL/uL (ref 3.87–5.11)
RDW: 17.7 % — AB (ref 11.5–15.5)
RDW: 18.6 % — AB (ref 11.5–15.5)
WBC: 16.5 10*3/uL — ABNORMAL HIGH (ref 4.0–10.5)
WBC: 17.8 10*3/uL — ABNORMAL HIGH (ref 4.0–10.5)

## 2014-04-15 LAB — BLOOD GAS, ARTERIAL
Acid-Base Excess: 8.1 mmol/L — ABNORMAL HIGH (ref 0.0–2.0)
BICARBONATE: 34 meq/L — AB (ref 20.0–24.0)
Drawn by: 27022
O2 Content: 4 L/min
O2 Saturation: 94.5 %
PCO2 ART: 67.3 mmHg — AB (ref 35.0–45.0)
Patient temperature: 98.6
TCO2: 36.1 mmol/L (ref 0–100)
pH, Arterial: 7.324 — ABNORMAL LOW (ref 7.350–7.450)
pO2, Arterial: 82.7 mmHg (ref 80.0–100.0)

## 2014-04-15 LAB — HEPATIC FUNCTION PANEL
ALK PHOS: 55 U/L (ref 39–117)
ALT: 22 U/L (ref 0–35)
ALT: 25 U/L (ref 0–35)
AST: 19 U/L (ref 0–37)
AST: 24 U/L (ref 0–37)
Albumin: 3.9 g/dL (ref 3.5–5.2)
Albumin: 3.7 g/dL (ref 3.5–5.2)
Alkaline Phosphatase: 53 U/L (ref 39–117)
BILIRUBIN DIRECT: 0.2 mg/dL (ref 0.0–0.3)
BILIRUBIN DIRECT: 0.2 mg/dL (ref 0.0–0.5)
Indirect Bilirubin: 0.5 mg/dL (ref 0.3–0.9)
TOTAL PROTEIN: 6.6 g/dL (ref 6.0–8.3)
Total Bilirubin: 0.5 mg/dL (ref 0.2–1.2)
Total Bilirubin: 0.7 mg/dL (ref 0.3–1.2)
Total Protein: 6 g/dL (ref 6.0–8.3)

## 2014-04-15 LAB — CREATININE, SERUM
Creatinine, Ser: 0.72 mg/dL (ref 0.50–1.10)
GFR calc Af Amer: >90 mL/min (ref >90)
GFR calc non Af Amer: 84 mL/min — ABNORMAL LOW (ref >90)

## 2014-04-15 LAB — CBC
HCT: 33.3 % — ABNORMAL LOW (ref 36.0–46.0)
HEMOGLOBIN: 9.4 g/dL — AB (ref 12.0–15.0)
MCH: 23.7 pg — AB (ref 26.0–34.0)
MCHC: 28.2 g/dL — ABNORMAL LOW (ref 30.0–36.0)
MCV: 83.9 fL (ref 78.0–100.0)
Platelets: 282 10*3/uL (ref 150–400)
RBC: 3.97 MIL/uL (ref 3.87–5.11)
RDW: 17.5 % — AB (ref 11.5–15.5)
WBC: 19.1 10*3/uL — AB (ref 4.0–10.5)

## 2014-04-15 LAB — MAGNESIUM: MAGNESIUM: 1.7 mg/dL (ref 1.5–2.5)

## 2014-04-15 LAB — TSH
TSH: 1.57 u[IU]/mL (ref 0.35–4.50)
TSH: 0.939 u[IU]/mL (ref 0.350–4.500)

## 2014-04-15 LAB — I-STAT TROPONIN, ED: Troponin i, poc: 0 ng/mL (ref 0.00–0.08)

## 2014-04-15 LAB — BRAIN NATRIURETIC PEPTIDE
B Natriuretic Peptide: 229.9 pg/mL — ABNORMAL HIGH (ref 0.0–100.0)
Pro B Natriuretic peptide (BNP): 230 pg/mL — ABNORMAL HIGH (ref 0.0–100.0)

## 2014-04-15 LAB — TROPONIN I: Troponin I: <0.03 ng/mL (ref <0.031)

## 2014-04-15 LAB — D-DIMER, QUANTITATIVE: D-Dimer, Quant: 0.37 ug/mL-FEU (ref 0.00–0.48)

## 2014-04-15 MED ORDER — ASPIRIN EC 81 MG PO TBEC: 81.0000 mg | DELAYED_RELEASE_TABLET | Freq: Every day | ORAL | Status: DC

## 2014-04-15 MED ORDER — FUROSEMIDE 10 MG/ML IJ SOLN
40.0000 mg | Freq: Once | INTRAMUSCULAR | Status: AC
  Administered 2014-04-15: 40 mg via INTRAVENOUS
  Filled 2014-04-15: qty 4

## 2014-04-15 MED ORDER — BUDESONIDE 0.25 MG/2ML IN SUSP
0.2500 mg | Freq: Two times a day (BID) | RESPIRATORY_TRACT | Status: DC
  Administered 2014-04-16 – 2014-04-18 (×6): 0.25 mg via RESPIRATORY_TRACT
  Filled 2014-04-15 (×9): qty 2

## 2014-04-15 MED ORDER — ENOXAPARIN SODIUM 40 MG/0.4ML ~~LOC~~ SOLN
40.0000 mg | SUBCUTANEOUS | Status: DC
  Administered 2014-04-15 – 2014-04-16 (×2): 40 mg via SUBCUTANEOUS
  Filled 2014-04-15 (×3): qty 0.4

## 2014-04-15 MED ORDER — PRAVASTATIN SODIUM 80 MG PO TABS
80.0000 mg | ORAL_TABLET | Freq: Every day | ORAL | Status: DC
  Administered 2014-04-15 – 2014-04-22 (×8): 80 mg via ORAL
  Filled 2014-04-15 (×9): qty 1

## 2014-04-15 MED ORDER — HYDROMORPHONE HCL 1 MG/ML IJ SOLN: 0.5000 mg | INTRAMUSCULAR | Status: DC | PRN

## 2014-04-15 MED ORDER — ACETAMINOPHEN 325 MG PO TABS
650.0000 mg | ORAL_TABLET | Freq: Four times a day (QID) | ORAL | Status: DC | PRN
  Administered 2014-04-15 (×2): 650 mg via ORAL
  Filled 2014-04-15 (×2): qty 2

## 2014-04-15 MED ORDER — CLONAZEPAM 0.5 MG PO TABS: 0.5000 mg | ORAL_TABLET | Freq: Three times a day (TID) | ORAL | Status: DC | PRN

## 2014-04-15 MED ORDER — METHYLPREDNISOLONE SODIUM SUCC 40 MG IJ SOLR
40.0000 mg | Freq: Four times a day (QID) | INTRAMUSCULAR | Status: DC
  Administered 2014-04-15 – 2014-04-18 (×12): 40 mg via INTRAVENOUS
  Filled 2014-04-15 (×14): qty 1

## 2014-04-15 MED ORDER — DILTIAZEM HCL ER COATED BEADS 300 MG PO CP24
300.0000 mg | ORAL_CAPSULE | Freq: Every day | ORAL | Status: DC
  Administered 2014-04-16 – 2014-04-23 (×7): 300 mg via ORAL
  Filled 2014-04-15 (×8): qty 1

## 2014-04-15 MED ORDER — NYSTATIN 100000 UNIT/ML MT SUSP
5.0000 mL | Freq: Four times a day (QID) | OROMUCOSAL | Status: DC
  Administered 2014-04-15 – 2014-04-23 (×25): 500000 [IU] via OROMUCOSAL
  Filled 2014-04-15 (×34): qty 5

## 2014-04-15 MED ORDER — SODIUM CHLORIDE 0.9 % IV SOLN
250.0000 mL | INTRAVENOUS | Status: DC | PRN
  Administered 2014-04-17: 250 mL via INTRAVENOUS

## 2014-04-15 MED ORDER — PANTOPRAZOLE SODIUM 40 MG PO TBEC
40.0000 mg | DELAYED_RELEASE_TABLET | Freq: Every day | ORAL | Status: DC
  Administered 2014-04-15 – 2014-04-23 (×8): 40 mg via ORAL
  Filled 2014-04-15 (×8): qty 1

## 2014-04-15 MED ORDER — ASPIRIN EC 81 MG PO TBEC
81.0000 mg | DELAYED_RELEASE_TABLET | Freq: Every day | ORAL | Status: DC
  Administered 2014-04-15 – 2014-04-23 (×7): 81 mg via ORAL
  Filled 2014-04-15 (×9): qty 1

## 2014-04-15 MED ORDER — GUAIFENESIN-DM 100-10 MG/5ML PO SYRP: 5.0000 mL | ORAL_SOLUTION | ORAL | Status: DC | PRN

## 2014-04-15 MED ORDER — LATANOPROST 0.005 % OP SOLN
1.0000 [drp] | Freq: Every day | OPHTHALMIC | Status: DC
  Administered 2014-04-15 – 2014-04-22 (×8): 1 [drp] via OPHTHALMIC
  Filled 2014-04-15 (×2): qty 2.5

## 2014-04-15 MED ORDER — CLOPIDOGREL BISULFATE 75 MG PO TABS
75.0000 mg | ORAL_TABLET | Freq: Every day | ORAL | Status: DC
  Administered 2014-04-15 – 2014-04-22 (×8): 75 mg via ORAL
  Filled 2014-04-15 (×12): qty 1

## 2014-04-15 MED ORDER — ACETAMINOPHEN 650 MG RE SUPP: 650.0000 mg | Freq: Four times a day (QID) | RECTAL | Status: DC | PRN

## 2014-04-15 MED ORDER — SODIUM CHLORIDE 0.9 % IJ SOLN
3.0000 mL | Freq: Two times a day (BID) | INTRAMUSCULAR | Status: DC
  Administered 2014-04-16 – 2014-04-21 (×7): 3 mL via INTRAVENOUS

## 2014-04-15 MED ORDER — DILTIAZEM HCL ER COATED BEADS 240 MG PO CP24: 240.0000 mg | ORAL_CAPSULE | Freq: Every day | ORAL | Status: DC

## 2014-04-15 MED ORDER — METHYLPREDNISOLONE SODIUM SUCC 125 MG IJ SOLR
INTRAMUSCULAR | Status: AC
Start: 2014-04-15 — End: 2014-04-15
  Filled 2014-04-15: qty 2

## 2014-04-15 MED ORDER — METHYLPREDNISOLONE SODIUM SUCC 125 MG IJ SOLR
125.0000 mg | Freq: Once | INTRAMUSCULAR | Status: AC
  Administered 2014-04-15: 125 mg via INTRAVENOUS
  Filled 2014-04-15: qty 2

## 2014-04-15 MED ORDER — SODIUM CHLORIDE 0.9 % IJ SOLN: 3.0000 mL | INTRAMUSCULAR | Status: DC | PRN

## 2014-04-15 MED ORDER — AMIODARONE HCL 200 MG PO TABS
200.0000 mg | ORAL_TABLET | Freq: Every day | ORAL | Status: DC
  Administered 2014-04-15 – 2014-04-22 (×8): 200 mg via ORAL
  Filled 2014-04-15 (×10): qty 1

## 2014-04-15 MED ORDER — SODIUM CHLORIDE 0.9 % IJ SOLN
3.0000 mL | Freq: Two times a day (BID) | INTRAMUSCULAR | Status: DC
  Administered 2014-04-15 – 2014-04-23 (×13): 3 mL via INTRAVENOUS

## 2014-04-15 MED ORDER — IPRATROPIUM BROMIDE 0.02 % IN SOLN
0.5000 mg | Freq: Four times a day (QID) | RESPIRATORY_TRACT | Status: DC
  Administered 2014-04-15 – 2014-04-16 (×5): 0.5 mg via RESPIRATORY_TRACT
  Filled 2014-04-15 (×5): qty 2.5

## 2014-04-15 NOTE — Assessment & Plan Note (Signed)
Continue statin. 

## 2014-04-15 NOTE — Assessment & Plan Note (Signed)
As outlined under respiratory failure we will increase Lasix for possible contribution of CHF to dyspnea. Check potassium and renal function in three days.

## 2014-04-15 NOTE — Assessment & Plan Note (Signed)
Continue amiodarone. Check chest x-ray, liver functions and TSH.

## 2014-04-15 NOTE — Addendum Note (Signed)
Addended by: Cristopher Estimable on: 04/15/2014 01:52 PM   Modules accepted: Orders

## 2014-04-15 NOTE — Assessment & Plan Note (Signed)
Patient complains of progressive dyspnea over the past 1 week. She is short of breath in the office today. She has severely diminished breath sounds but no expiratory wheeze. She feels as though she is volume overloaded but not significantly on exam. I recommended that she be admitted and treated for acute on chronic diastolic congestive heart failure and COPD. However she declined stating she would prefer to be treated at home. We will check potassium, renal function, BNP, hemoglobin. Change Lasix to 40 mg daily. She has stated she will go to the emergency room if she worsens. She understands the risk of not being admitted. I think she is most likely dyspneic from a combination of diastolic heart failure and COPD. There are no symptoms of pneumonia or bronchitis. Check chest x-ray.

## 2014-04-15 NOTE — Progress Notes (Signed)
Pt states she wears home O2 at 4.5 LPM with long extension tubing. Came in with home O2 in lap and running. Placed with patient belongings.

## 2014-04-15 NOTE — H&P (Addendum)
[facility] History and Physical  MERELYN KLUMP MEQ:[id number] DOB: 1940/07/01 DOA: 04/15/2014  Referring physician:   PCP: Sherrie Mustache, MD   Chief Complaint:  Sob, sternal chest pain HPI:  74 yr old with CAD, hypertension, hyperlipidemia, SVT, AAA, COPD, bladder cancer and cerebrovascular disease sent from cardiology office for  dyspnea that has worsened over the past 1 week. She has severe dyspnea with any activity and some dyspnea at rest. No fevers, chills or productive cough. No hemoptysis. She denies orthopnea or PND but she has had  increased weight. She saw Dr. Stanford Breed her cardiologist today who increased her Lasix and recommended admission for acute on chronic heart failure however patient refused. When she was outside the medical building she did develop some chest pain and increased shortness of breath and EMS was called. She was brought here to the emergency room. Upon arrival pt was pale with sats of 91% on 4 L, tachypneic in 30's with pressure of 90 sbp. EKG showed RBB. EMS gave 5 mg albuterol . She had about a 20-30 minutes some tightness in the center of her chest. She denies any chest pain currently. She still feels short of breath. She was given one nebulizer treatment around and feels a little bit better. She's on baseline oxygen at 4 L/m. She has had some increased cough but no change in color of her sputum. She denies any known fevers. She denies any nausea or vomiting ER workup : chest x-ray does not show edema. Her BNP is mildly elevated. There is no evidence of pneumonia. She did have some chest pain but her troponin is negative. Her EKG shows a right bundle branch block which is new from her past EKG but no ST elevation is noted. She's been given another nebulizer treatment here in the ED as well as Solu-Medrol. She's feeling better ,admitted for CHF ,COPD exacerbation.   Review of Systems: negative for the following  Constitutional: Denies fever, chills,  diaphoresis, appetite change and fatigue.  HEENT: Denies photophobia, eye pain, redness, hearing loss, ear pain, congestion, sore throat, rhinorrhea, sneezing, mouth sores, trouble swallowing, neck pain, neck stiffness and tinnitus.  Respiratory: Positive for chest tightness and shortness of breath. Negative for cough.  Cardiovascular: Positive for chest pain. Negative for leg swelling.  Gastrointestinal: Denies nausea, vomiting, abdominal pain, diarrhea, constipation, blood in stool and abdominal distention.  Genitourinary: Denies dysuria, urgency, frequency, hematuria, flank pain and difficulty urinating.  Musculoskeletal: Denies myalgias, back pain, joint swelling, arthralgias and gait problem.  Skin: Denies pallor, rash and wound.  Neurological: Denies dizziness, seizures, syncope, weakness, light-headedness, numbness and headaches.  Hematological: Denies adenopathy. Easy bruising, personal or family bleeding history  Psychiatric/Behavioral: Denies suicidal ideation, mood changes, confusion, nervousness, sleep disturbance and agitation       Past Medical History  Diagnosis Date  . Hypertension   . Hyperlipidemia   . Cerebrovascular disease, unspecified   . Unspecified glaucoma   . Obesity, unspecified   . Coronary artery disease     cath 04/06/10: LAD occluded with R-L collats; med Rx WTC....probable V. Tach...med Rx  . Ventricular tachycardia   . SVT (supraventricular tachycardia)   . AAA (abdominal aortic aneurysm)   . COPD (chronic obstructive pulmonary disease) 2013    [place] of breath     WITH ACTIVITY--USES OXYGEN AS NEEDED  2&1/2 L PER NASAL CANNUL  . Complication of anesthesia     PT STATES SHE CAN NOT BE PUT TO SLEEP  BECAUSE OF HER LUNG PROBLEMS  . Carotid artery occlusion   . Anemia   . CHF (congestive heart failure)   . Abdominal aneurysm     "hasn't been repaired" (10/20/2013)  . Pneumonia X ~ 2  . On home oxygen therapy     "4L; 24/7"  (10/20/2013)  . GERD (gastroesophageal reflux disease)   . Migraines     "before tubal ligation I had them alot; seldom have one anymore" (10/20/2013)  . Arthritis     "knees mainly" (10/20/2013)  . Bladder cancer 2009  . Atrial fibrillation   . Bell palsy 2014     Past Surgical History  Procedure Laterality Date  . Cystoscopy    . Bladder tumor excision      resection of 5 bladder, and fulguration of 1 small bladder tumor  . Cystoscopy      cold cup bladder biopsy of five tumors, and fulguration of one tumor  . Wedge resection Right 1980's    Remote right lung  . Cystoscopy with biopsy  12/24/2011    Procedure: CYSTOSCOPY WITH BIOPSY;  Surgeon: Fredricka Bonine, MD;  Location: [facility];  Service: Urology;  Laterality: N/A;  . Tubal ligation Bilateral 1980's  . Cataract extraction w/ intraocular lens  implant, bilateral Bilateral ~ 2011  . Cardiac catheterization  "several"    "too bad to put stents in"      Social History:  reports that she quit smoking about 2 years ago. Her smoking use included Cigarettes. She has a 100 pack-year smoking history. She has never used smokeless tobacco. She reports that she does not drink alcohol or use illicit drugs.  ?  Allergies  Allergen Reactions  . Albuterol Other (See Comments)    Jittery/shakiness  . Levaquin [Levofloxacin In D5w] Nausea Only  . Sulfa Antibiotics Nausea Only    Family History  Problem Relation Age of Onset  . Heart disease Mother 62  . Heart disease Father   . Hyperlipidemia Father   . Hypertension Father   . Diabetes Daughter   . Peripheral vascular disease Daughter         Prior to Admission medications   Medication Sig Start Date End Date Taking? Authorizing Provider  acetaminophen-codeine (TYLENOL #3) 300-30 MG per tablet Take 1 tablet by mouth every 8 (eight) hours as needed for moderate pain.     Historical Provider, MD  amiodarone (PACERONE) 200 MG tablet Take 200 mg by mouth at bedtime.   09/20/11   Lelon Perla, MD  aspirin EC 81 MG tablet Take 81 mg by mouth at bedtime.    Historical Provider, MD  budesonide (PULMICORT) 0.25 MG/2ML nebulizer solution 2 ml twice daily with perforomist 10/29/13   Tanda Rockers, MD  clonazePAM (KLONOPIN) 0.5 MG tablet 1/2-1 every 6 hours as needed for anxiety    Historical Provider, MD  clopidogrel (PLAVIX) 75 MG tablet Take 1 tablet (75 mg total) by mouth at bedtime. 12/26/11   Festus Aloe, MD  Dextromethorphan-Guaifenesin ([place] FAST-MAX DM MAX) 5-100 MG/5ML LIQD Take 5 mLs by mouth every 12 (twelve) hours as needed (for congestion).     Historical Provider, MD  diltiazem (CARDIZEM CD) 300 MG 24 hr capsule Take 300 mg by mouth daily.    Historical Provider, MD  diltiazem (CARDIZEM CD) 300 MG 24 hr capsule TAKE 1 CAPSULE IN THE EVENING. 02/07/14   Lelon Perla, MD  furosemide (LASIX) 20 MG tablet Take 2 tablets (40 mg total) by  mouth daily. 11/12/13   Lelon Perla, MD  ipratropium (ATROVENT) 0.02 % nebulizer solution Take 0.5 mg by nebulization 4 (four) times daily.     Historical Provider, MD  ipratropium (ATROVENT) 0.02 % nebulizer solution USE 1 VIAL IN NEBULIZER EVERY 6 HOURS. 03/14/14   Tanda Rockers, MD  levalbuterol [facility] HFA) 45 MCG/ACT inhaler Inhale 2 puffs into the lungs every 4 (four) hours as needed for wheezing. 09/07/13   Tammy S Parrett, NP  nitroGLYCERIN (NITROSTAT) 0.4 MG SL tablet Place 0.4 mg under the tongue every 5 (five) minutes as needed for chest pain (may repeat x3). Chest pain 07/10/10   Lelon Perla, MD  nystatin (MYCOSTATIN) 100000 UNIT/ML suspension TAKE 1 TEASPOONFUL FOUR TIMES DAILY AS DIRECTED. 04/04/14   Historical Provider, MD  pantoprazole (PROTONIX) 40 MG tablet Take 40 mg by mouth daily.    Historical Provider, MD  pantoprazole (PROTONIX) 40 MG tablet TAKE 1 TABLET BY MOUTH ONCE DAILY 30-60 MINTUES BEFORE FIRST MEAL OF THE DAY. 03/07/14   Tanda Rockers, MD  potassium chloride SA (K-DUR,KLOR-CON)  20 MEQ tablet 2 tabs by mouth once daily    Historical Provider, MD  pravastatin (PRAVACHOL) 80 MG tablet Take 80 mg by mouth at bedtime.    Historical Provider, MD  predniSONE (DELTASONE) 20 MG tablet Take 10 mg by mouth daily with breakfast.    Historical Provider, MD  promethazine (PHENERGAN) 25 MG tablet Take 25 mg by mouth every 4 (four) hours as needed for nausea.     Historical Provider, MD  ranitidine (ZANTAC) 150 MG capsule Take 150 mg by mouth at bedtime.    Historical Provider, MD  Travoprost, BAK Free, (TRAVATAN) 0.004 % SOLN ophthalmic solution Place 1 drop into both eyes at bedtime.    Historical Provider, MD     Physical Exam: Filed Vitals:   04/15/14 1430 04/15/14 1439 04/15/14 1500 04/15/14 1530  BP: 127/61  136/120 138/71  Pulse: 99  96 92  Resp: 17  21 17   SpO2: 97% 97% 100% 100%     Constitutional: Vital signs reviewed. Patient is a well-developed and well-nourished in no acute distress and cooperative with exam. Alert and oriented x3.  Head: Normocephalic and atraumatic  Ear: TM normal bilaterally  Mouth: no erythema or exudates, MMM  Eyes: PERRL, EOMI, conjunctivae normal, No scleral icterus.  Neck: Supple, Trachea midline normal ROM, No JVD, mass, thyromegaly, or carotid bruit present.  Cardiovascular: RRR, S1 normal, S2 normal, no MRG, pulses symmetric and intact bilaterally  Pulmonary/Chest: Effort normal. No respiratory distress. She has wheezes. She has no rales. She exhibits no tenderness.  Diminished breath sounds bilaterally with scarce expiratory wheezing. Patient has tachypnea and talking in short sentences.  Abdominal: Soft. Non-tender, non-distended, bowel sounds are normal, no masses, organomegaly, or guarding present.  GU: no CVA tenderness Musculoskeletal: No joint deformities, erythema, or stiffness, ROM full and no nontender Ext: no edema and no cyanosis, pulses palpable bilaterally (DP and PT)  Hematology: no cervical, inginal, or axillary  adenopathy.  Neurological: A&O x3, Strenght is normal and symmetric bilaterally, cranial nerve II-XII are grossly intact, no focal motor deficit, sensory intact to light touch bilaterally.  Skin: Warm, dry and intact. No rash, cyanosis, or clubbing.  Psychiatric: Normal mood and affect. speech and behavior is normal. Judgment and thought content normal. Cognition and memory are normal.       Labs on Admission:    Basic Metabolic Panel:  Recent Labs Lab 04/15/14  1417 04/15/14 1423  NA 141 138  K 3.5 3.5  CL 94* 92*  CO2 38* 37*  GLUCOSE 103* 118*  BUN 12 9  CREATININE 0.65 0.69  CALCIUM 9.3 9.3   Liver Function Tests:  Recent Labs Lab 04/15/14 1417  AST 19  ALT 22  ALKPHOS 55  BILITOT 0.5  PROT 6.6  ALBUMIN 3.9   No results for input(s): LIPASE, AMYLASE in the last 168 hours. No results for input(s): AMMONIA in the last 168 hours. CBC:  Recent Labs Lab 04/15/14 1417 04/15/14 1423  WBC 17.8* 16.5*  NEUTROABS 14.9* 13.2*  HGB 10.0* 10.2*  HCT 31.8* 36.4  MCV 79.2 85.8  PLT 348.0 317   Cardiac Enzymes: No results for input(s): CKTOTAL, CKMB, CKMBINDEX, TROPONINI in the last 168 hours.  BNP (last 3 results)  Recent Labs  04/15/14 1423  BNP 229.9*    ProBNP (last 3 results)  Recent Labs  11/11/13 1424 12/29/13 1238 04/15/14 1417  PROBNP 671.3* 195.0* 230.0*       CBG: No results for input(s): GLUCAP in the last 168 hours.  Radiological Exams on Admission: Dg Chest Port 1 View  04/15/2014   CLINICAL DATA:  Chest pain and difficulty breathing acute onset  EXAM: PORTABLE CHEST - 1 VIEW  COMPARISON:  Study obtained earlier in the day; November 11, 2013  FINDINGS: There is underlying emphysematous change. There is chronic pleural thickening on the right which appears stable. There is no frank edema or consolidation. Heart is enlarged with pulmonary vascularity within normal limits. There is atherosclerotic change in the aorta. No adenopathy. No  bone lesions.  IMPRESSION: Stable cardiomegaly. Chronic pleural thickening on the right. Underlying emphysema. No frank edema or consolidation.   Electronically Signed   By: Lowella Grip III M.D.   On: 04/15/2014 14:49    EKG: Independently reviewed.EKG Interpretation   Date/Time: Friday April 15 2014 15:50:10 EST Ventricular Rate: 97 PR Interval: 193 QRS Duration: 162 QT Interval: 403 QTC Calculation: 512 R Axis: 94 Text Interpretation: Sinus rhythm IVCD, consider atypical RBBB  Assessment/Plan Active Problems:   Acute exacerbation of chronic obstructive pulmonary disease (COPD)   COPD (chronic obstructive pulmonary disease)       Shortness of breath Multifactorial , CHF/COPD exacerbation Patient will be admitted to telemetry As recommended by Dr. Jacalyn Lefevre  patient will be treated for acute on chronic diastolic congestive heart failure and COPD exacerbation Chest x-ray does not show any significant pulmonary congestion We'll check a d-dimer, and cycle cardiac enzymes Start the patient on nebulizer treatments, will give 1 dose of IV Lasix Continue IV Solu-Medrol patient received 125 mg 1 continue 40 mg IV every 6 hours 2-D echo in the morning Because of leukocytosis will start the patient on azithromycin and Rocephin  Essential hypertension-continue diltiazem, start Lasix IV     Acute-on-chronic respiratory failure , oxygen dependent at home on 4 L ABG shows hypercapnia , likely chronic      History of CVA Continue aspirin and Plavix  Coronary artery disease Continue with aspirin and Plavix, and cycle cardiac enzymes  Gastroesophageal reflux Continue with Protonix  Atrial fibrillation continue with amiodarone    Anxiety disorder-continue with Klonopin           Code Status:   full Family Communication: bedside Disposition Plan: admit   Time spent: 70 mins   [facility] Pager [phone]  If 7PM-7AM, please contact  night-coverage [email or web address] Password [facility] 04/15/2014, 4:48 PM

## 2014-04-15 NOTE — Assessment & Plan Note (Signed)
Blood pressure is mildly decreased. Discontinue Imdur and follow.

## 2014-04-15 NOTE — ED Provider Notes (Addendum)
CSN: [id number]     Arrival date & time 04/15/14  1407 History   First MD Initiated Contact with Patient 04/15/14 1422     Chief Complaint  Patient presents with  . Chest Pain  . Respiratory Distress     (Consider location/radiation/quality/duration/timing/severity/associated sxs/prior Treatment) HPI Comments: Patient presents with shortness of breath. She has a history of COPD as well as CHF, AAA, and V-tach. She's had progressive shortness of breath over the last week and a half. She saw Dr. Stanford Breed her cardiologist today who increased her Lasix and recommended admission for acute on chronic heart failure however patient refused. When she was outside the medical building she did develop some chest pain and increased shortness of breath and EMS was called. She was brought here to the emergency room. She had about a 20-30 minutes some tightness in the center of her chest. She denies any chest pain currently. She still feels short of breath. She was given one nebulizer treatment around and feels a little bit better. She's on baseline oxygen at 4 L/m. She has had some increased cough but no change in color of her sputum. She denies any known fevers. She denies any nausea or vomiting.   Past Medical History  Diagnosis Date  . Hypertension   . Hyperlipidemia   . Cerebrovascular disease, unspecified   . Unspecified glaucoma   . Obesity, unspecified   . Coronary artery disease     cath 04/06/10: LAD occluded with R-L collats; med Rx WTC....probable V. Tach...med Rx  . Ventricular tachycardia   . SVT (supraventricular tachycardia)   . AAA (abdominal aortic aneurysm)   . COPD (chronic obstructive pulmonary disease) 2013    [place] of breath     WITH ACTIVITY--USES OXYGEN AS NEEDED  2&1/2 L PER NASAL CANNUL  . Complication of anesthesia     PT STATES SHE CAN NOT BE PUT TO SLEEP BECAUSE OF HER LUNG PROBLEMS  . Carotid artery occlusion   . Anemia   . CHF (congestive heart  failure)   . Abdominal aneurysm     "hasn't been repaired" (10/20/2013)  . Pneumonia X ~ 2  . On home oxygen therapy     "4L; 24/7" (10/20/2013)  . GERD (gastroesophageal reflux disease)   . Migraines     "before tubal ligation I had them alot; seldom have one anymore" (10/20/2013)  . Arthritis     "knees mainly" (10/20/2013)  . Bladder cancer 2009  . Atrial fibrillation   . Bell palsy 2014   Past Surgical History  Procedure Laterality Date  . Cystoscopy    . Bladder tumor excision      resection of 5 bladder, and fulguration of 1 small bladder tumor  . Cystoscopy      cold cup bladder biopsy of five tumors, and fulguration of one tumor  . Wedge resection Right 1980's    Remote right lung  . Cystoscopy with biopsy  12/24/2011    Procedure: CYSTOSCOPY WITH BIOPSY;  Surgeon: Fredricka Bonine, MD;  Location: [facility];  Service: Urology;  Laterality: N/A;  . Tubal ligation Bilateral 1980's  . Cataract extraction w/ intraocular lens  implant, bilateral Bilateral ~ 2011  . Cardiac catheterization  "several"    "too bad to put stents in"   Family History  Problem Relation Age of Onset  . Heart disease Mother 50  . Heart disease Father   . Hyperlipidemia Father   . Hypertension Father   .  Diabetes Daughter   . Peripheral vascular disease Daughter    History  Substance Use Topics  . Smoking status: Former Smoker -- 2.00 packs/day for 50 years    Types: Cigarettes    Quit date: 05/18/2011  . Smokeless tobacco: Never Used  . Alcohol Use: No   OB History    No data available     Review of Systems  Constitutional: Positive for fatigue. Negative for fever, chills and diaphoresis.  HENT: Negative for congestion, rhinorrhea and sneezing.   Eyes: Negative.   Respiratory: Positive for chest tightness and shortness of breath. Negative for cough.   Cardiovascular: Positive for chest pain. Negative for leg swelling.  Gastrointestinal: Negative for nausea, vomiting, abdominal  pain, diarrhea and blood in stool.  Genitourinary: Negative for frequency, hematuria, flank pain and difficulty urinating.  Musculoskeletal: Negative for back pain and arthralgias.  Skin: Negative for rash.  Neurological: Negative for dizziness, speech difficulty, weakness, numbness and headaches.      Allergies  Albuterol; Levaquin; and Sulfa antibiotics  Home Medications   Prior to Admission medications   Medication Sig Start Date End Date Taking? Authorizing Provider  acetaminophen-codeine (TYLENOL #3) 300-30 MG per tablet Take 1 tablet by mouth every 8 (eight) hours as needed for moderate pain.     Historical Provider, MD  amiodarone (PACERONE) 200 MG tablet Take 200 mg by mouth at bedtime.  09/20/11   Lelon Perla, MD  aspirin EC 81 MG tablet Take 81 mg by mouth at bedtime.    Historical Provider, MD  budesonide (PULMICORT) 0.25 MG/2ML nebulizer solution 2 ml twice daily with perforomist 10/29/13   Tanda Rockers, MD  clonazePAM (KLONOPIN) 0.5 MG tablet 1/2-1 every 6 hours as needed for anxiety    Historical Provider, MD  clopidogrel (PLAVIX) 75 MG tablet Take 1 tablet (75 mg total) by mouth at bedtime. 12/26/11   Festus Aloe, MD  Dextromethorphan-Guaifenesin ([place] FAST-MAX DM MAX) 5-100 MG/5ML LIQD Take 5 mLs by mouth every 12 (twelve) hours as needed (for congestion).     Historical Provider, MD  diltiazem (CARDIZEM CD) 300 MG 24 hr capsule Take 300 mg by mouth daily.    Historical Provider, MD  diltiazem (CARDIZEM CD) 300 MG 24 hr capsule TAKE 1 CAPSULE IN THE EVENING. 02/07/14   Lelon Perla, MD  furosemide (LASIX) 20 MG tablet Take 2 tablets (40 mg total) by mouth daily. 11/12/13   Lelon Perla, MD  ipratropium (ATROVENT) 0.02 % nebulizer solution Take 0.5 mg by nebulization 4 (four) times daily.     Historical Provider, MD  ipratropium (ATROVENT) 0.02 % nebulizer solution USE 1 VIAL IN NEBULIZER EVERY 6 HOURS. 03/14/14   Tanda Rockers, MD  levalbuterol [facility]  HFA) 45 MCG/ACT inhaler Inhale 2 puffs into the lungs every 4 (four) hours as needed for wheezing. 09/07/13   Tammy S Parrett, NP  nitroGLYCERIN (NITROSTAT) 0.4 MG SL tablet Place 0.4 mg under the tongue every 5 (five) minutes as needed for chest pain (may repeat x3). Chest pain 07/10/10   Lelon Perla, MD  nystatin (MYCOSTATIN) 100000 UNIT/ML suspension TAKE 1 TEASPOONFUL FOUR TIMES DAILY AS DIRECTED. 04/04/14   Historical Provider, MD  pantoprazole (PROTONIX) 40 MG tablet Take 40 mg by mouth daily.    Historical Provider, MD  pantoprazole (PROTONIX) 40 MG tablet TAKE 1 TABLET BY MOUTH ONCE DAILY 30-60 MINTUES BEFORE FIRST MEAL OF THE DAY. 03/07/14   Tanda Rockers, MD  potassium chloride SA (  K-DUR,KLOR-CON) 20 MEQ tablet 2 tabs by mouth once daily    Historical Provider, MD  pravastatin (PRAVACHOL) 80 MG tablet Take 80 mg by mouth at bedtime.    Historical Provider, MD  predniSONE (DELTASONE) 20 MG tablet Take 10 mg by mouth daily with breakfast.    Historical Provider, MD  promethazine (PHENERGAN) 25 MG tablet Take 25 mg by mouth every 4 (four) hours as needed for nausea.     Historical Provider, MD  ranitidine (ZANTAC) 150 MG capsule Take 150 mg by mouth at bedtime.    Historical Provider, MD  Travoprost, BAK Free, (TRAVATAN) 0.004 % SOLN ophthalmic solution Place 1 drop into both eyes at bedtime.    Historical Provider, MD   BP 138/71 mmHg  Pulse 92  Resp 17  SpO2 100% Physical Exam  Constitutional: She is oriented to person, place, and time. She appears well-developed and well-nourished.  HENT:  Head: Normocephalic and atraumatic.  Mouth/Throat: Oropharynx is clear and moist.  Eyes: Pupils are equal, round, and reactive to light.  Neck: Normal range of motion. Neck supple.  Cardiovascular: Normal rate, regular rhythm and normal heart sounds.   Pulmonary/Chest: Effort normal. No respiratory distress. She has wheezes. She has no rales. She exhibits no tenderness.  Diminished breath sounds  bilaterally with scarce expiratory wheezing. Patient has tachypnea and talking in short sentences.  Abdominal: Soft. Bowel sounds are normal. There is no tenderness. There is no rebound and no guarding.  Musculoskeletal: Normal range of motion. She exhibits no edema.  Lymphadenopathy:    She has no cervical adenopathy.  Neurological: She is alert and oriented to person, place, and time.  Skin: Skin is warm and dry. No rash noted.  Psychiatric: She has a normal mood and affect.    ED Course  Procedures (including critical care time) Labs Review Labs Reviewed  BASIC METABOLIC PANEL - Abnormal; Notable for the following:    Chloride 92 (*)    CO2 37 (*)    Glucose, Bld 118 (*)    GFR calc non Af Amer 85 (*)    All other components within normal limits  CBC WITH DIFFERENTIAL/PLATELET - Abnormal; Notable for the following:    WBC 16.5 (*)    Hemoglobin 10.2 (*)    MCH 24.1 (*)    MCHC 28.0 (*)    RDW 17.7 (*)    Neutrophils Relative % 80 (*)    Neutro Abs 13.2 (*)    Lymphocytes Relative 10 (*)    Monocytes Absolute 1.6 (*)    All other components within normal limits  BRAIN NATRIURETIC PEPTIDE - Abnormal; Notable for the following:    B Natriuretic Peptide 229.9 (*)    All other components within normal limits  I-STAT TROPOININ, ED    Imaging Review Dg Chest Port 1 View  04/15/2014   CLINICAL DATA:  Chest pain and difficulty breathing acute onset  EXAM: PORTABLE CHEST - 1 VIEW  COMPARISON:  Study obtained earlier in the day; November 11, 2013  FINDINGS: There is underlying emphysematous change. There is chronic pleural thickening on the right which appears stable. There is no frank edema or consolidation. Heart is enlarged with pulmonary vascularity within normal limits. There is atherosclerotic change in the aorta. No adenopathy. No bone lesions.  IMPRESSION: Stable cardiomegaly. Chronic pleural thickening on the right. Underlying emphysema. No frank edema or consolidation.    Electronically Signed   By: Lowella Grip III M.D.   On: 04/15/2014 14:49  EKG Interpretation   Date/Time:  Friday April 15 2014 15:50:10 EST Ventricular Rate:  97 PR Interval:  193 QRS Duration: 162 QT Interval:  403 QTC Calculation: 512 R Axis:   94 Text Interpretation:  Sinus rhythm IVCD, consider atypical RBBB Abnormal  lateral Q waves Confirmed by Jaquese Irving  MD, Amyri Frenz ([id number]) on 04/15/2014  3:52:38 PM      MDM   Final diagnoses:  COPD exacerbation  Acute on chronic congestive heart failure, unspecified congestive heart failure type    Patient presents with shortness of breath. Her chest x-ray does not show edema. Her BNP is mildly elevated. There is no evidence of pneumonia. I feel she most likely has a COPD exacerbation with possible congestive heart failure component. She did have some chest pain but her troponin is negative. Her EKG shows a right bundle branch block which is new from her past EKG but no ST elevation is noted. She's been given another nebulizer treatment here in the ED as well as Solu-Medrol. She's feeling better but still has some tachypnea and mildly increased work of breathing. I will consult the unassigned medicine team for admission. Her primary care physician is with [facility].    Malvin Johns, MD 04/15/14 [place], MD 04/15/14 [phone]

## 2014-04-15 NOTE — Progress Notes (Signed)
1853 abg results in . Made dr Allyson Sabal aware. Pt no apparent distress

## 2014-04-15 NOTE — Assessment & Plan Note (Signed)
Follow-up ultrasound December 2016. However doubt she would be a good surgical candidate.

## 2014-04-15 NOTE — ED Notes (Signed)
Pt was at MD's office for a regular visit when she when she became sob and began having sternal chest pain.  C/o weakness x several days.  Her doctor had told her to stop taking her isosorbide and increase her furosemide.  When EMS arrived pt was pale with sats of 91% on 4 L, tachypneic in 30's with pressure of 90 sbp.  EKG showed RBB.  EMS gave 5 mg albuterol.

## 2014-04-15 NOTE — Assessment & Plan Note (Signed)
Continue aspirin and statin. Followed by vascular surgery. 

## 2014-04-15 NOTE — Assessment & Plan Note (Signed)
Continue aspirin and statin. 

## 2014-04-15 NOTE — ED Notes (Signed)
[name] [name], [phone] - Friend to contact b/c family may not be able to come.

## 2014-04-15 NOTE — Patient Instructions (Addendum)
Your physician recommends that you schedule a follow-up appointment IN ONE WEEK WITH APP   STOP ISOSORBIDE  INCREASE FUROSEMIDE TO 34 MG ONCE DAILY  Your physician recommends that you HAVE LAB WORK TODAY  A chest x-ray takes a picture of the organs and structures inside the chest, including the heart, lungs, and blood vessels. This test can show several things, including, whether the heart is enlarges; whether fluid is building up in the lungs; and whether pacemaker / defibrillator leads are still in place. [place]  Your physician recommends that you return for lab work ON Monday 04-18-14

## 2014-04-15 NOTE — Assessment & Plan Note (Signed)
Continue amiodarone. 

## 2014-04-15 NOTE — Progress Notes (Addendum)
Attempted ABG. x2 unsuccessful. Pt refused to let RT attempted ABG again. RN aware.

## 2014-04-16 DIAGNOSIS — R0600 Dyspnea, unspecified: Secondary | ICD-10-CM

## 2014-04-16 DIAGNOSIS — I214 Non-ST elevation (NSTEMI) myocardial infarction: Secondary | ICD-10-CM

## 2014-04-16 DIAGNOSIS — I714 Abdominal aortic aneurysm, without rupture: Secondary | ICD-10-CM

## 2014-04-16 DIAGNOSIS — J9620 Acute and chronic respiratory failure, unspecified whether with hypoxia or hypercapnia: Secondary | ICD-10-CM

## 2014-04-16 DIAGNOSIS — I2510 Atherosclerotic heart disease of native coronary artery without angina pectoris: Secondary | ICD-10-CM

## 2014-04-16 LAB — TROPONIN I
TROPONIN I: 0.57 ng/mL — AB (ref <0.031)
TROPONIN I: 0.35 ng/mL — AB (ref <0.031)
Troponin I: 0.32 ng/mL — ABNORMAL HIGH (ref <0.031)

## 2014-04-16 LAB — COMPREHENSIVE METABOLIC PANEL
ALT: 24 U/L (ref 0–35)
AST: 26 U/L (ref 0–37)
Albumin: 3.3 g/dL — ABNORMAL LOW (ref 3.5–5.2)
Alkaline Phosphatase: 50 U/L (ref 39–117)
Anion gap: 6 (ref 5–15)
BILIRUBIN TOTAL: 0.7 mg/dL (ref 0.3–1.2)
BUN: 9 mg/dL (ref 6–23)
CALCIUM: 8.7 mg/dL (ref 8.4–10.5)
CO2: 41 mmol/L (ref 19–32)
CREATININE: 0.61 mg/dL (ref 0.50–1.10)
Chloride: 93 mmol/L — ABNORMAL LOW (ref 96–112)
GFR, EST NON AFRICAN AMERICAN: 88 mL/min — AB (ref >90)
Glucose, Bld: 140 mg/dL — ABNORMAL HIGH (ref 70–99)
POTASSIUM: 3.5 mmol/L (ref 3.5–5.1)
Sodium: 140 mmol/L (ref 135–145)
Total Protein: 5.7 g/dL — ABNORMAL LOW (ref 6.0–8.3)

## 2014-04-16 LAB — CBC
HCT: 31.6 % — ABNORMAL LOW (ref 36.0–46.0)
Hemoglobin: 8.8 g/dL — ABNORMAL LOW (ref 12.0–15.0)
MCH: 23.3 pg — ABNORMAL LOW (ref 26.0–34.0)
MCHC: 27.8 g/dL — ABNORMAL LOW (ref 30.0–36.0)
MCV: 83.6 fL (ref 78.0–100.0)
Platelets: 281 10*3/uL (ref 150–400)
RBC: 3.78 MIL/uL — ABNORMAL LOW (ref 3.87–5.11)
RDW: 17.4 % — ABNORMAL HIGH (ref 11.5–15.5)
WBC: 9.8 10*3/uL (ref 4.0–10.5)

## 2014-04-16 MED ORDER — ACETAMINOPHEN-CODEINE #3 300-30 MG PO TABS
1.0000 | ORAL_TABLET | Freq: Four times a day (QID) | ORAL | Status: DC | PRN
  Administered 2014-04-16 – 2014-04-17 (×3): 1 via ORAL
  Filled 2014-04-16 (×3): qty 1

## 2014-04-16 MED ORDER — CLONAZEPAM 0.5 MG PO TABS
0.5000 mg | ORAL_TABLET | Freq: Three times a day (TID) | ORAL | Status: DC | PRN
  Administered 2014-04-16 – 2014-04-17 (×2): 1 mg via ORAL
  Filled 2014-04-16 (×2): qty 2

## 2014-04-16 MED ORDER — LEVALBUTEROL HCL 0.63 MG/3ML IN NEBU
0.6300 mg | INHALATION_SOLUTION | Freq: Four times a day (QID) | RESPIRATORY_TRACT | Status: DC | PRN
  Administered 2014-04-23: 0.63 mg via RESPIRATORY_TRACT
  Filled 2014-04-16 (×2): qty 3

## 2014-04-16 MED ORDER — IPRATROPIUM BROMIDE 0.02 % IN SOLN
0.5000 mg | Freq: Three times a day (TID) | RESPIRATORY_TRACT | Status: DC
  Administered 2014-04-17 – 2014-04-19 (×6): 0.5 mg via RESPIRATORY_TRACT
  Filled 2014-04-16 (×7): qty 2.5

## 2014-04-16 MED ORDER — FUROSEMIDE 40 MG PO TABS
40.0000 mg | ORAL_TABLET | Freq: Every day | ORAL | Status: DC
  Administered 2014-04-16 – 2014-04-20 (×4): 40 mg via ORAL
  Filled 2014-04-16 (×5): qty 1

## 2014-04-16 NOTE — Progress Notes (Signed)
  Echocardiogram 2D Echocardiogram has been performed.  Darlina Sicilian M 04/16/2014, 11:27 AM

## 2014-04-16 NOTE — Progress Notes (Signed)
[facility] PROGRESS NOTE  CARNITA GOLOB FOY:[id number] DOB: 18-Sep-1940 DOA: 04/15/2014 PCP: Sherrie Mustache, MD  Assessment/Plan: Active Problems:   Essential hypertension   Acute-on-chronic respiratory failure   Acute exacerbation of chronic obstructive pulmonary disease (COPD)   Anxiety disorder   Chronic diastolic congestive heart failure   COPD (chronic obstructive pulmonary disease)   Shortness of breath Multifactorial , CHF/COPD exacerbation Continue telemetry Continue medications for acute on chronic diastolic congestive heart failure and COPD exacerbation Chest x-ray does not show any significant pulmonary congestion D-dimer negative,, troponin increasing Will repeat another troponin this afternoon Continue nebulizer treatments, continue Lasix will switch to by mouth Continue IV Solu-Medrol patient received 125 mg 1 continue 40 mg IV every 6 hours 2-D echo pending to rule out wall motion abnormalities Leukocytosis improving, despite steroids therefore no antibiotics given    Essential hypertension-continue diltiazem, by mouth Lasix    Acute-on-chronic respiratory failure , oxygen dependent at home on 4 L ABG shows hypercapnia , likely chronic  Also exacerbated by Klonopin (patient takes quite a high dose of this)  History of CVA Continue aspirin and Plavix  Coronary artery disease Continue with aspirin and Plavix, and cycle cardiac enzymes Troponin trending up  Cardiology consulted to make further recommendations  Gastroesophageal reflux Continue with Protonix  Atrial fibrillation continue with amiodarone   Anxiety disorder-continue with Klonopin     Code Status: full Family Communication: family updated about patient's clinical progress Disposition Plan:  As above    Brief narrative: HPI:  74 yr old with CAD, hypertension, hyperlipidemia, SVT, AAA, COPD, bladder cancer and cerebrovascular disease sent from cardiology office for  dyspnea that has worsened over the past 1 week. She has severe dyspnea with any activity and some dyspnea at rest. No fevers, chills or productive cough. No hemoptysis. She denies orthopnea or PND but she has had increased weight. She saw Dr. Stanford Breed her cardiologist today who increased her Lasix and recommended admission for acute on chronic heart failure however patient refused. When she was outside the medical building she did develop some chest pain and increased shortness of breath and EMS was called. She was brought here to the emergency room. Upon arrival pt was pale with sats of 91% on 4 L, tachypneic in 30's with pressure of 90 sbp. EKG showed RBB. EMS gave 5 mg albuterol . She had about a 20-30 minutes some tightness in the center of her chest. She denies any chest pain currently. She still feels short of breath. She was given one nebulizer treatment around and feels a little bit better. She's on baseline oxygen at 4 L/m. She has had some increased cough but no change in color of her sputum. She denies any known fevers. She denies any nausea or vomiting ER workup : chest x-ray does not show edema. Her BNP is mildly elevated. There is no evidence of pneumonia. She did have some chest pain but her troponin is negative. Her EKG shows a right bundle branch block which is new from her past EKG but no ST elevation is noted. She's been given another nebulizer treatment here in the ED as well as Solu-Medrol. She's feeling better ,admitted for CHF ,COPD exacerbation.   Consultants:  Cardiology  Procedures:  None  Antibiotics: None  HPI/Subjective: No complaints, no chest pain, denies any nausea vomiting breathing is better  Objective: Filed Vitals:   04/15/14 2108 04/16/14 0046 04/16/14 0553 04/16/14 0819  BP: 131/57  126/77   Pulse: 99 99 103  Temp: 98.5 F (36.9 C)  98 F (36.7 C)   TempSrc: Oral  Oral   Resp: 21 20 20    Height:      Weight:   81.557 kg (179 lb 12.8 oz)   SpO2:  98% 98% 92% 97%    Intake/Output Summary (Last 24 hours) at 04/16/14 1117 Last data filed at 04/16/14 0957  Gross per 24 hour  Intake    922 ml  Output   1200 ml  Net   -278 ml    Exam:  General: No acute respiratory distress Lungs: Clear to auscultation bilaterally without wheezes or crackles Cardiovascular: Regular rate and rhythm without murmur gallop or rub normal S1 and S2 Abdomen: Nontender, nondistended, soft, bowel sounds positive, no rebound, no ascites, no appreciable mass Extremities: No significant cyanosis, clubbing, or edema bilateral lower extremities      Data Reviewed: Basic Metabolic Panel:  Recent Labs Lab 04/15/14 1417 04/15/14 1423 04/15/14 1700 04/16/14 0535  NA 141 138  --  140  K 3.5 3.5  --  3.5  CL 94* 92*  --  93*  CO2 38* 37*  --  41*  GLUCOSE 103* 118*  --  140*  BUN 12 9  --  9  CREATININE 0.65 0.69 0.72 0.61  CALCIUM 9.3 9.3  --  8.7  MG  --   --  1.7  --     Liver Function Tests:  Recent Labs Lab 04/15/14 1417 04/15/14 1700 04/16/14 0535  AST 19 24 26   ALT 22 25 24   ALKPHOS 55 53 50  BILITOT 0.5 0.7 0.7  PROT 6.6 6.0 5.7*  ALBUMIN 3.9 3.7 3.3*   No results for input(s): LIPASE, AMYLASE in the last 168 hours. No results for input(s): AMMONIA in the last 168 hours.  CBC:  Recent Labs Lab 04/15/14 1417 04/15/14 1423 04/15/14 1700 04/16/14 0535  WBC 17.8* 16.5* 19.1* 9.8  NEUTROABS 14.9* 13.2*  --   --   HGB 10.0* 10.2* 9.4* 8.8*  HCT 31.8* 36.4 33.3* 31.6*  MCV 79.2 85.8 83.9 83.6  PLT 348.0 317 282 281    Cardiac Enzymes:  Recent Labs Lab 04/15/14 1700 04/15/14 2307 04/16/14 0535  TROPONINI <0.03 0.32* 0.57*   BNP (last 3 results)  Recent Labs  04/15/14 1423  BNP 229.9*    ProBNP (last 3 results)  Recent Labs  11/11/13 1424 12/29/13 1238 04/15/14 1417  PROBNP 671.3* 195.0* 230.0*      CBG: No results for input(s): GLUCAP in the last 168 hours.  No results found for this or any  previous visit (from the past 240 hour(s)).   Studies: Dg Chest 1 View  04/15/2014   CLINICAL DATA:  74 year old female with a history of shortness of breath, hypertension  EXAM: CHEST  1 VIEW  COMPARISON:  11/11/2013  FINDINGS: Cardiomediastinal silhouette unchanged in size and contour with cardiomegaly.  Atherosclerotic calcifications of the aortic arch.  No evidence of pulmonary vascular congestion.  No pneumothorax.  Persistent opacity at the base of the right lung, present on comparison plain film studies.  Retrocardiac region not well evaluated.  No definite confluent airspace disease.  No displaced fracture.  IMPRESSION: No evidence of pulmonary edema, with stable cardiomegaly and atherosclerotic changes.  Retrocardiac region not well visualized, and if there is were further concern for acute process, a dedicated PA and lateral chest x-ray would be useful.  Persistent opacity at the right base, likely reflective of atelectasis and/ or scarring.  If there were a need to further characterize the finding a CT would be required.  Signed,  Dulcy Fanny. Earleen Newport, DO  [facility]  [facility]   Electronically Signed   By: Corrie Mckusick D.O.   On: 04/15/2014 17:09   Dg Chest Port 1 View  04/15/2014   CLINICAL DATA:  Chest pain and difficulty breathing acute onset  EXAM: PORTABLE CHEST - 1 VIEW  COMPARISON:  Study obtained earlier in the day; November 11, 2013  FINDINGS: There is underlying emphysematous change. There is chronic pleural thickening on the right which appears stable. There is no frank edema or consolidation. Heart is enlarged with pulmonary vascularity within normal limits. There is atherosclerotic change in the aorta. No adenopathy. No bone lesions.  IMPRESSION: Stable cardiomegaly. Chronic pleural thickening on the right. Underlying emphysema. No frank edema or consolidation.   Electronically Signed   By: Lowella Grip III M.D.   On: 04/15/2014 14:49     Scheduled Meds: . amiodarone  200 mg Oral QHS  . aspirin EC  81 mg Oral Daily  . budesonide  0.25 mg Nebulization BID  . clopidogrel  75 mg Oral QHS  . diltiazem  300 mg Oral Daily  . enoxaparin (LOVENOX) injection  40 mg Subcutaneous Q24H  . ipratropium  0.5 mg Nebulization Q6H  . latanoprost  1 drop Both Eyes QHS  . methylPREDNISolone (SOLU-MEDROL) injection  40 mg Intravenous Q6H  . nystatin  5 mL Mouth/Throat QID  . pantoprazole  40 mg Oral Daily  . pravastatin  80 mg Oral QHS  . sodium chloride  3 mL Intravenous Q12H  . sodium chloride  3 mL Intravenous Q12H   Continuous Infusions:   Active Problems:   Essential hypertension   Acute-on-chronic respiratory failure   Acute exacerbation of chronic obstructive pulmonary disease (COPD)   Anxiety disorder   Chronic diastolic congestive heart failure   COPD (chronic obstructive pulmonary disease)    Time spent: 40 minutes   [facility] Pager [phone]. If 7PM-7AM, please contact night-coverage at [email or web address], password [place] 04/16/2014, 11:17 AM  LOS: 1 day

## 2014-04-16 NOTE — Consult Note (Signed)
CARDIOLOGY CONSULT NOTE   Patient ID: [name] [name] MRN: [id number] DOB/AGE: 73-02-1940 73 y.o.  Admit date: 04/15/2014  Primary Physician   Sherrie Mustache, MD Primary Cardiologist   Dr. Stanford Breed Reason for Consultation  + troponin  HPI: [name] [name] is a 74 y.o. female with a history of HTN, HLD, SVT on amiodarone, AAA (4.3 x 3.8 cm ), COPD on home 02 , bladder cancer and cerebrovascular disease who was admitted to [facility] on 04/15/14 for dyspnea and chest pain.  She presented to [facility] in early February 2012 for a COPD exacerbation and possible diastolic heart failure. She was noted to have wide complex tachycardia that was felt to be ventricular tachycardia and she was transferred to [facility]. Cardiac catheterization on 04/06/10 demonstrated a totally occluded LAD with right to left collaterals, a nodular density in the prox CFX that occupied about 70% of the prox vessel, and preserved LVF. It was felt that her CAD should be treated medically. She was evaluated by Dr. Lovena Le. It was felt that her ventricular tachycardia would also be treated medically with adjustments in her beta blocker therapy. The patient has previous attempt at carotid stent on the left. The procedure was unsuccessful due to anatomy; followed by vascular surgery. Repeat echocardiogram in March of 2013 showed normal LV function. There was trace aortic insufficiency and mild left atrial enlargement. Patient also on amiodarone for h/o SVT. Ultrasound 01/2014 revealed 4.3 x 3.8 cm AAA.   She was seen by Dr Stanford Breed on 04/14/14 in the office for evaluation of dyspnea that had worsened over the past 1 week. She has severe dyspnea with any activity and some dyspnea at rest. She was felt to have acute on chronic diastolic CHF and COPD exacerbation. It was recommended that she proceed to the hospital for admission; however she felt like she would rather go home. So he ordered CXR, labwork and increased lasix.  She left to the office to get her chest xray a [facility] and had sudden onset of chest and dyspnea and EMS was called. She was admitted by [facility] and treated with one dose of IV lasix and with nebs/steroids for COPD exacerbation. She did have some transient CP that resolved on its own. No current CP and SOB improved. Her troponin was noted to be elevated and cardiology was consulted.    Past Medical History  Diagnosis Date  . Hypertension   . Hyperlipidemia   . Cerebrovascular disease, unspecified   . Unspecified glaucoma   . Obesity, unspecified   . Coronary artery disease     cath 04/06/10: LAD occluded with R-L collats; med Rx WTC....probable V. Tach...med Rx  . Ventricular tachycardia   . SVT (supraventricular tachycardia)   . AAA (abdominal aortic aneurysm)   . COPD (chronic obstructive pulmonary disease) 2013    [place] of breath     WITH ACTIVITY--USES OXYGEN AS NEEDED  2&1/2 L PER NASAL CANNUL  . Complication of anesthesia     PT STATES SHE CAN NOT BE PUT TO SLEEP BECAUSE OF HER LUNG PROBLEMS  . Carotid artery occlusion   . Anemia   . CHF (congestive heart failure)   . Abdominal aneurysm     "hasn't been repaired" (10/20/2013)  . Pneumonia X ~ 2  . On home oxygen therapy     "4L; 24/7" (10/20/2013)  . GERD (gastroesophageal reflux disease)   . Migraines     "before  tubal ligation I had them alot; seldom have one anymore" (10/20/2013)  . Arthritis     "knees mainly" (10/20/2013)  . Bladder cancer 2009  . Atrial fibrillation   . Bell palsy 2014     Past Surgical History  Procedure Laterality Date  . Cystoscopy    . Bladder tumor excision      resection of 5 bladder, and fulguration of 1 small bladder tumor  . Cystoscopy      cold cup bladder biopsy of five tumors, and fulguration of one tumor  . Wedge resection Right 1980's    Remote right lung  . Cystoscopy with biopsy  12/24/2011    Procedure: CYSTOSCOPY WITH BIOPSY;  Surgeon: Fredricka Bonine, MD;  Location: [facility];  Service: Urology;  Laterality: N/A;  . Tubal ligation Bilateral 1980's  . Cataract extraction w/ intraocular lens  implant, bilateral Bilateral ~ 2011  . Cardiac catheterization  "several"    "too bad to put stents in"    Allergies  Allergen Reactions  . Albuterol Other (See Comments)    Jittery/shakiness  . Levaquin [Levofloxacin In D5w] Nausea Only  . Sulfa Antibiotics Nausea Only    I have reviewed the patient's current medications . amiodarone  200 mg Oral QHS  . aspirin EC  81 mg Oral Daily  . budesonide  0.25 mg Nebulization BID  . clopidogrel  75 mg Oral QHS  . diltiazem  300 mg Oral Daily  . enoxaparin (LOVENOX) injection  40 mg Subcutaneous Q24H  . ipratropium  0.5 mg Nebulization Q6H  . latanoprost  1 drop Both Eyes QHS  . methylPREDNISolone (SOLU-MEDROL) injection  40 mg Intravenous Q6H  . nystatin  5 mL Mouth/Throat QID  . pantoprazole  40 mg Oral Daily  . pravastatin  80 mg Oral QHS  . sodium chloride  3 mL Intravenous Q12H  . sodium chloride  3 mL Intravenous Q12H     sodium chloride, acetaminophen **OR** acetaminophen, clonazePAM, guaiFENesin-dextromethorphan, HYDROmorphone (DILAUDID) injection, sodium chloride  Prior to Admission medications   Medication Sig Start Date End Date Taking? Authorizing Provider  acetaminophen-codeine (TYLENOL #3) 300-30 MG per tablet Take 1 tablet by mouth every 8 (eight) hours as needed for moderate pain.    Yes Historical Provider, MD  amiodarone (PACERONE) 200 MG tablet Take 200 mg by mouth at bedtime.  09/20/11  Yes Lelon Perla, MD  aspirin EC 81 MG tablet Take 81 mg by mouth at bedtime.   Yes Historical Provider, MD  budesonide (PULMICORT) 0.25 MG/2ML nebulizer solution 2 ml twice daily with perforomist 10/29/13  Yes Tanda Rockers, MD  clonazePAM (KLONOPIN) 0.5 MG tablet Take 0.25-0.5 mg by mouth every 6 (six) hours as needed for anxiety.    Yes Historical Provider, MD  clopidogrel (PLAVIX)  75 MG tablet Take 1 tablet (75 mg total) by mouth at bedtime. 12/26/11  Yes Festus Aloe, MD  Dextromethorphan-Guaifenesin ([place] FAST-MAX DM MAX) 5-100 MG/5ML LIQD Take 5 mLs by mouth every 12 (twelve) hours as needed (for congestion).    Yes Historical Provider, MD  diltiazem (CARDIZEM CD) 300 MG 24 hr capsule TAKE 1 CAPSULE IN THE EVENING. 02/07/14  Yes Lelon Perla, MD  furosemide (LASIX) 20 MG tablet Take 2 tablets (40 mg total) by mouth daily. 11/12/13  Yes Lelon Perla, MD  ipratropium (ATROVENT) 0.02 % nebulizer solution Take 0.5 mg by nebulization 4 (four) times daily.    Yes Historical Provider, MD  ipratropium (ATROVENT) 0.02 %  nebulizer solution USE 1 VIAL IN NEBULIZER EVERY 6 HOURS. 03/14/14  Yes Tanda Rockers, MD  levalbuterol [facility] HFA) 45 MCG/ACT inhaler Inhale 2 puffs into the lungs every 4 (four) hours as needed for wheezing. 09/07/13  Yes Tammy S Parrett, NP  nitroGLYCERIN (NITROSTAT) 0.4 MG SL tablet Place 0.4 mg under the tongue every 5 (five) minutes as needed for chest pain (may repeat x3). Chest pain 07/10/10  Yes Lelon Perla, MD  nystatin (MYCOSTATIN) 100000 UNIT/ML suspension TAKE 1 TEASPOONFUL FOUR TIMES DAILY AS DIRECTED. 04/04/14  Yes Historical Provider, MD  pantoprazole (PROTONIX) 40 MG tablet Take 40 mg by mouth daily.   Yes Historical Provider, MD  pantoprazole (PROTONIX) 40 MG tablet TAKE 1 TABLET BY MOUTH ONCE DAILY 30-60 MINTUES BEFORE FIRST MEAL OF THE DAY. 03/07/14  Yes Tanda Rockers, MD  potassium chloride SA (K-DUR,KLOR-CON) 20 MEQ tablet Take 40 mEq by mouth daily.    Yes Historical Provider, MD  pravastatin (PRAVACHOL) 80 MG tablet Take 80 mg by mouth at bedtime.   Yes Historical Provider, MD  predniSONE (DELTASONE) 20 MG tablet Take 10 mg by mouth daily with breakfast.   Yes Historical Provider, MD  promethazine (PHENERGAN) 25 MG tablet Take 25 mg by mouth every 4 (four) hours as needed for nausea.    Yes Historical Provider, MD  ranitidine  (ZANTAC) 150 MG capsule Take 150 mg by mouth at bedtime.   Yes Historical Provider, MD  Travoprost, BAK Free, (TRAVATAN) 0.004 % SOLN ophthalmic solution Place 1 drop into both eyes at bedtime.   Yes Historical Provider, MD     History   Social History  . Marital Status: Married    Spouse Name: N/A  . Number of Children: 2  . Years of Education: N/A   Occupational History  . Retired     Social History Main Topics  . Smoking status: Former Smoker -- 2.00 packs/day for 50 years    Types: Cigarettes    Quit date: 05/18/2011  . Smokeless tobacco: Never Used  . Alcohol Use: No  . Drug Use: No  . Sexual Activity: No   Other Topics Concern  . Not on file   Social History Narrative   Lives in [place], [place] with husband.     Family Status  Relation Status Death Age  . Mother Deceased   . Father Deceased    Family History  Problem Relation Age of Onset  . Heart disease Mother 37  . Heart disease Father   . Hyperlipidemia Father   . Hypertension Father   . Diabetes Daughter   . Peripheral vascular disease Daughter      ROS:  Full 14 point review of systems complete and found to be negative unless listed above.  Physical Exam: Blood pressure 126/77, pulse 103, temperature 98 F (36.7 C), temperature source Oral, resp. rate 20, height 5\' 5"  (1.651 m), weight 179 lb 12.8 oz (81.557 kg), SpO2 97 %.  General: Well developed, well nourished, female in no acute distress. obese Head: Eyes PERRLA, No xanthomas.   Normocephalic and atraumatic, oropharynx without edema or exudate. Dentition:  Lungs: markedly decreased breath sounds throughout. No wheezes Heart: HRRR S1 S2, no rub/gallop, Heart irregular rate and rhythm with S1, S2  murmur. pulses are 2+ extrem.   Neck: No carotid bruits. No lymphadenopathy. no JVD. Abdomen: Bowel sounds present, abdomen soft and non-tender without masses or hernias noted. Msk:  No spine or cva tenderness. No weakness, no joint  deformities or  effusions. Extremities: No clubbing or cyanosis. no  edema.  Neuro: Alert and oriented X 3. No focal deficits noted. Psych:  Good affect, responds appropriately Skin: No rashes or lesions noted.  Labs:   Lab Results  Component Value Date   WBC 9.8 04/16/2014   HGB 8.8* 04/16/2014   HCT 31.6* 04/16/2014   MCV 83.6 04/16/2014   PLT 281 04/16/2014   No results for input(s): INR in the last 72 hours.  Recent Labs Lab 04/16/14 0535  NA 140  K 3.5  CL 93*  CO2 41*  BUN 9  CREATININE 0.61  CALCIUM 8.7  PROT 5.7*  BILITOT 0.7  ALKPHOS 50  ALT 24  AST 26  GLUCOSE 140*  ALBUMIN 3.3*   MAGNESIUM  Date Value Ref Range Status  04/15/2014 1.7 1.5 - 2.5 mg/dL Final    Recent Labs  04/15/14 1700 04/15/14 2307 04/16/14 0535  TROPONINI <0.03 0.32* 0.57*    Recent Labs  04/15/14 1446  TROPIPOC 0.00   PRO B NATRIURETIC PEPTIDE (BNP)  Date/Time Value Ref Range Status  04/15/2014 02:17 PM 230.0* 0.0 - 100.0 pg/mL Final  12/29/2013 12:38 PM 195.0* 0.0 - 100.0 pg/mL Final   No results found for: CHOL, HDL, LDLCALC, TRIG Lab Results  Component Value Date   DDIMER 0.37 04/15/2014    Echo: Study Date: 05/21/2011 LV EF: 65% -   70% Study Conclusions - Procedure narrative: Transthoracic echocardiography. Image   quality was suboptimal but adequate. The study was   technically difficult, as a result of poor acoustic   windows and poor patient compliance. - Left ventricle: The cavity size was normal. Wall thickness   was increased in a pattern of mild LVH. There was   hypertrophy, with an appearance suggesting concentric   remodeling (increased wall thickness with normal wall   mass). Systolic function was vigorous. The estimated   ejection fraction was in the range of 65% to 70%. Doppler   parameters are consistent with abnormal left ventricular   relaxation (grade 1 diastolic dysfunction). Doppler   parameters are consistent with elevated mean left atrial   filling  pressure. - Regional wall motion abnormality: There is possible mild   hypokinesis of the apical septal myocardium. - Ventricular septum: Septal motion showed "bounce". These   changes are consistent with intraventricular conduction   delay. and atrial fibrillation with aberrantly conducted   beats vs. PVCs. - Aortic valve: Mildly calcified annulus. Probably   trileaflet. Trivial regurgitation. - Left atrium: The atrium was mildly dilated.   ECG:  RBBB  Radiology:  Dg Chest 1 View  04/15/2014   CLINICAL DATA:  74 year old female with a history of shortness of breath, hypertension  EXAM: CHEST  1 VIEW  COMPARISON:  11/11/2013  FINDINGS: Cardiomediastinal silhouette unchanged in size and contour with cardiomegaly.  Atherosclerotic calcifications of the aortic arch.  No evidence of pulmonary vascular congestion.  No pneumothorax.  Persistent opacity at the base of the right lung, present on comparison plain film studies.  Retrocardiac region not well evaluated.  No definite confluent airspace disease.  No displaced fracture.  IMPRESSION: No evidence of pulmonary edema, with stable cardiomegaly and atherosclerotic changes.  Retrocardiac region not well visualized, and if there is were further concern for acute process, a dedicated PA and lateral chest x-ray would be useful.  Persistent opacity at the right base, likely reflective of atelectasis and/ or scarring. If there were a need to further characterize the finding  a CT would be required.  Signed,  Dulcy Fanny. Earleen Newport, DO  [facility]  [facility]   Electronically Signed   By: Corrie Mckusick D.O.   On: 04/15/2014 17:09   Dg Chest Port 1 View  04/15/2014   CLINICAL DATA:  Chest pain and difficulty breathing acute onset  EXAM: PORTABLE CHEST - 1 VIEW  COMPARISON:  Study obtained earlier in the day; November 11, 2013  FINDINGS: There is underlying emphysematous change. There is chronic pleural thickening on  the right which appears stable. There is no frank edema or consolidation. Heart is enlarged with pulmonary vascularity within normal limits. There is atherosclerotic change in the aorta. No adenopathy. No bone lesions.  IMPRESSION: Stable cardiomegaly. Chronic pleural thickening on the right. Underlying emphysema. No frank edema or consolidation.   Electronically Signed   By: Lowella Grip III M.D.   On: 04/15/2014 14:49    ASSESSMENT AND PLAN:    Active Problems:   Essential hypertension   Acute-on-chronic respiratory failure   Acute exacerbation of chronic obstructive pulmonary disease (COPD)   Anxiety disorder   Chronic diastolic congestive heart failure   COPD (chronic obstructive pulmonary disease)  [name] [name] is a 74 y.o. female with a history of HTN, HLD, SVT on amiodarone, AAA (4.3 x 3.8 cm ), COPD on home 02 , bladder cancer and cerebrovascular disease who was admitted to [facility] on 04/15/14 for dyspnea and chest pain. She was admitted by [facility] and treated with one dose of IV lasix and with nebs/steroids for COPD exacerbation. She did have some transient CP that resolved on its own.  Her troponin was noted to be elevated and cardiology was consulted.   Acute-on-chronic respiratory failure, oxygen dependent at home on 4 L -- ABG shows hypercapnia , likely chronic  -- Multifactorial , CHF/COPD exacerbation -- Given one dose of IV lasix 40mg . Weight down 2lbs (181--> 179lbs). Converted to po Lasix 40mg  qd.  -- CXR with no significant pulmonary congestion, BNP slightly elevated ~230 -- D-dimer negative -- Continue nebulizer treatments and IV Solu-Medrol patient received 125 mg 1 continue 40 mg IV every 6 hours -- 2-D echo pending  -- Initially WBC 19.1. Leukocytosis improving, despite steroids therefore no antibiotics given  Transient CP/Elevated troponin- Cardiac catheterization on 04/06/10 demonstrated a totally occluded LAD with right to left collaterals, a nodular density in the  prox CFX that occupied about 70% of the prox vessel, and preserved LVF. It was felt that her CAD should be treated medically.  -- Continue with aspirin and Plavix -- Troponin negative --> 0.32--> 0.57 . ECG with new TWI laterally.  Chronic RBBB. -- 2D ECHO pending.   Essential hypertension-continue diltiazem CD 300mg   History of CVA- Continue aspirin and Plavix  GERD- continue with Protonix  Hx of SVT/Vtac- continue with amiodarone -- TSH and LFTs normal on long term amio.  AAA- f/u US in 01/2015.   Carotid stenosis- continue ASA and statin. Followed by vascular surgery  Signed: Crista Luria 04/16/2014 11:20 AM  Pager [phone]  Co-Sign MD  Patient seen and examined with Nell Range, PA-C. We discussed all aspects of the encounter. I agree with the assessment and plan as stated above.  Suspect she has had demand ischemia with very small troponin rise in setting of severe COPD flare. She denies CP at baseline. With results of previous cath and new TWI I have suggested cath but she refuses and wants to go home  on medical therapy. Would continue ASA 81, Plavix 75, high-dose statin. Likely cannot tolerate b-blocker due to severe COPD.   We will sign off. Please call us if we can be of further assistance of if she decides to proceed with cath.   Daniel Bensimhon,MD 12:18 PM

## 2014-04-16 NOTE — Progress Notes (Signed)
Nurse notified Dr. Allyson Sabal via text message of pt's troponin level critical value 0.57.   [name] [name] I 04/16/2014 7:18 AM

## 2014-04-16 NOTE — Progress Notes (Signed)
Nurse notified MD, Dr. Allyson Sabal that pt's pt's co2 41. Will monitor   Angeline Slim I 04/16/2014 7:37 AM

## 2014-04-16 NOTE — Progress Notes (Signed)
PT Cancellation Note  Patient Details Name: [name] [name] MRN: [id number] DOB: 1940/11/12   Cancelled Treatment:    Reason Eval/Treat Not Completed: Medical issues which prohibited therapy Patient now with upward trending troponin. Will hold for downward trend or to be cleared by MD for PT evaluation.    Ellouise Newer 04/16/2014, 8:03 AM Elayne Snare, [place]

## 2014-04-16 NOTE — Progress Notes (Signed)
Nurse notified Md, Dr. Allyson Sabal that 12 led EKG has been completed. Will continue to monitor pt closely.   Angeline Slim I 04/16/2014 11:56 AM

## 2014-04-16 NOTE — Progress Notes (Signed)
INITIAL NUTRITION ASSESSMENT  DOCUMENTATION CODES Per approved criteria  -Not Applicable   INTERVENTION: -Will order small snack since pt has strong dislike for the food.   NUTRITION DIAGNOSIS: Inadequate oral intake related to dislike of hospital food as evidenced by only eating half of her meals.   Goal: Pt to meet >/= 90% of their estimated nutrition needs   Monitor:  Oral intake, weight, labs/procedures  Reason for Assessment: Consult for Assessment of nutritional Status   74 y.o. female  Admitting Dx: <principal problem not specified>  ASSESSMENT: 74 yr old with CAD, hypertension, hyperlipidemia, SVT, AAA, COPD, bladder cancer and cerebrovascular disease sent from cardiology office for dyspnea that has worsened over the past 1 week  Spoke to pt. She said she hasn't been eating well in the hospital because she does not like the food at all. Prior to being admitted she said she was eating fine with no GI distress and she reports no change in her weight. She does not take any vit/minerals at home and does not follow any certain diet.   Nutrition Focused Physical Exam: None all areas-pt obese   Height: Ht Readings from Last 1 Encounters:  04/15/14 5\' 5"  (1.651 m)    Weight: Wt Readings from Last 1 Encounters:  04/16/14 179 lb 12.8 oz (81.557 kg)    Ideal Body Weight: 125  % Ideal Body Weight: 143%  Wt Readings from Last 10 Encounters:  04/16/14 179 lb 12.8 oz (81.557 kg)  12/29/13 179 lb (81.194 kg)  12/14/13 175 lb (79.379 kg)  12/13/13 172 lb (78.019 kg)  11/11/13 172 lb (78.019 kg)  10/21/13 176 lb 5.9 oz (80 kg)  09/07/13 176 lb 6.4 oz (80.015 kg)  06/14/13 176 lb (79.833 kg)  06/10/13 176 lb 1.9 oz (79.888 kg)  01/01/13 178 lb 6.4 oz (80.922 kg)    Usual Body Weight: 180  % Usual Body Weight: 100  BMI:  Body mass index is 29.92 kg/(m^2).  Estimated Nutritional Needs: Kcal: 1500-1850 (18-23 kcal/kg) Protein: 90-106 (1.1-1.3 kcal/kg) Fluid:  1.5-1.9 liters  Skin: Dry/flaky with rash present  Diet Order: Diet Heart  EDUCATION NEEDS: -No education needs identified at this time   Intake/Output Summary (Last 24 hours) at 04/16/14 0835 Last data filed at 04/16/14 0600  Gross per 24 hour  Intake    462 ml  Output   1200 ml  Net   -738 ml   Ate 50%/60% last meals  Last BM: WDL   Labs:   Recent Labs Lab 04/15/14 1417 04/15/14 1423 04/15/14 1700 04/16/14 0535  NA 141 138  --  140  K 3.5 3.5  --  3.5  CL 94* 92*  --  93*  CO2 38* 37*  --  41*  BUN 12 9  --  9  CREATININE 0.65 0.69 0.72 0.61  CALCIUM 9.3 9.3  --  8.7  MG  --   --  1.7  --   GLUCOSE 103* 118*  --  140*    CBG (last 3)  No results for input(s): GLUCAP in the last 72 hours.  Scheduled Meds: . amiodarone  200 mg Oral QHS  . aspirin EC  81 mg Oral Daily  . budesonide  0.25 mg Nebulization BID  . clopidogrel  75 mg Oral QHS  . diltiazem  300 mg Oral Daily  . enoxaparin (LOVENOX) injection  40 mg Subcutaneous Q24H  . ipratropium  0.5 mg Nebulization Q6H  . latanoprost  1 drop Both  Eyes QHS  . methylPREDNISolone (SOLU-MEDROL) injection  40 mg Intravenous Q6H  . nystatin  5 mL Mouth/Throat QID  . pantoprazole  40 mg Oral Daily  . pravastatin  80 mg Oral QHS  . sodium chloride  3 mL Intravenous Q12H  . sodium chloride  3 mL Intravenous Q12H    Continuous Infusions:   Past Medical History  Diagnosis Date  . Hypertension   . Hyperlipidemia   . Cerebrovascular disease, unspecified   . Unspecified glaucoma   . Obesity, unspecified   . Coronary artery disease     cath 04/06/10: LAD occluded with R-L collats; med Rx WTC....probable V. Tach...med Rx  . Ventricular tachycardia   . SVT (supraventricular tachycardia)   . AAA (abdominal aortic aneurysm)   . COPD (chronic obstructive pulmonary disease) 2013    [place] of breath     WITH ACTIVITY--USES OXYGEN AS NEEDED  2&1/2 L PER NASAL CANNUL  . Complication of anesthesia      PT STATES SHE CAN NOT BE PUT TO SLEEP BECAUSE OF HER LUNG PROBLEMS  . Carotid artery occlusion   . Anemia   . CHF (congestive heart failure)   . Abdominal aneurysm     "hasn't been repaired" (10/20/2013)  . Pneumonia X ~ 2  . On home oxygen therapy     "4L; 24/7" (10/20/2013)  . GERD (gastroesophageal reflux disease)   . Migraines     "before tubal ligation I had them alot; seldom have one anymore" (10/20/2013)  . Arthritis     "knees mainly" (10/20/2013)  . Bladder cancer 2009  . Atrial fibrillation   . Bell palsy 2014    Past Surgical History  Procedure Laterality Date  . Cystoscopy    . Bladder tumor excision      resection of 5 bladder, and fulguration of 1 small bladder tumor  . Cystoscopy      cold cup bladder biopsy of five tumors, and fulguration of one tumor  . Wedge resection Right 1980's    Remote right lung  . Cystoscopy with biopsy  12/24/2011    Procedure: CYSTOSCOPY WITH BIOPSY;  Surgeon: Fredricka Bonine, MD;  Location: [facility];  Service: Urology;  Laterality: N/A;  . Tubal ligation Bilateral 1980's  . Cataract extraction w/ intraocular lens  implant, bilateral Bilateral ~ 2011  . Cardiac catheterization  "several"    "too bad to put stents in"    Burtis Junes RD, LDN Nutrition Pager: [phone] 04/16/2014 8:36 AM

## 2014-04-17 DIAGNOSIS — I509 Heart failure, unspecified: Secondary | ICD-10-CM

## 2014-04-17 DIAGNOSIS — R079 Chest pain, unspecified: Secondary | ICD-10-CM

## 2014-04-17 DIAGNOSIS — I10 Essential (primary) hypertension: Secondary | ICD-10-CM

## 2014-04-17 DIAGNOSIS — I25110 Atherosclerotic heart disease of native coronary artery with unstable angina pectoris: Secondary | ICD-10-CM

## 2014-04-17 DIAGNOSIS — R7989 Other specified abnormal findings of blood chemistry: Secondary | ICD-10-CM | POA: Diagnosis present

## 2014-04-17 DIAGNOSIS — R778 Other specified abnormalities of plasma proteins: Secondary | ICD-10-CM | POA: Diagnosis present

## 2014-04-17 LAB — BLOOD GAS, ARTERIAL
Acid-Base Excess: 14.1 mmol/L — ABNORMAL HIGH (ref 0.0–2.0)
Acid-Base Excess: 14.2 mmol/L — ABNORMAL HIGH (ref 0.0–2.0)
Bicarbonate: 40.7 mEq/L — ABNORMAL HIGH (ref 20.0–24.0)
Bicarbonate: 40.3 mEq/L — ABNORMAL HIGH (ref 20.0–24.0)
Drawn by: 10552
Drawn by: 418751
O2 CONTENT: 4 L/min
O2 Content: 4 L/min
O2 Saturation: 95.5 %
O2 Saturation: 96.2 %
PATIENT TEMPERATURE: 98.6
PH ART: 7.32 — AB (ref 7.350–7.450)
PO2 ART: 102 mmHg — AB (ref 80.0–100.0)
Patient temperature: 98.6
TCO2: 43.2 mmol/L (ref 0–100)
TCO2: 42.6 mmol/L (ref 0–100)
pCO2 arterial: 81.4 mmHg (ref 35.0–45.0)
pCO2 arterial: 74.3 mmHg (ref 35.0–45.0)
pH, Arterial: 7.353 (ref 7.350–7.450)
pO2, Arterial: 88.4 mmHg (ref 80.0–100.0)

## 2014-04-17 LAB — COMPREHENSIVE METABOLIC PANEL
ALK PHOS: 44 U/L (ref 39–117)
ALT: 23 U/L (ref 0–35)
AST: 22 U/L (ref 0–37)
Albumin: 3.2 g/dL — ABNORMAL LOW (ref 3.5–5.2)
Anion gap: 8 (ref 5–15)
BUN: 16 mg/dL (ref 6–23)
CALCIUM: 8.8 mg/dL (ref 8.4–10.5)
CO2: 38 mmol/L — ABNORMAL HIGH (ref 19–32)
Chloride: 93 mmol/L — ABNORMAL LOW (ref 96–112)
Creatinine, Ser: 0.72 mg/dL (ref 0.50–1.10)
GFR calc non Af Amer: 84 mL/min — ABNORMAL LOW (ref >90)
Glucose, Bld: 143 mg/dL — ABNORMAL HIGH (ref 70–99)
POTASSIUM: 3.4 mmol/L — AB (ref 3.5–5.1)
SODIUM: 139 mmol/L (ref 135–145)
TOTAL PROTEIN: 5.5 g/dL — AB (ref 6.0–8.3)
Total Bilirubin: 0.4 mg/dL (ref 0.3–1.2)

## 2014-04-17 LAB — CBC
HCT: 31.3 % — ABNORMAL LOW (ref 36.0–46.0)
HEMOGLOBIN: 8.8 g/dL — AB (ref 12.0–15.0)
MCH: 23.6 pg — ABNORMAL LOW (ref 26.0–34.0)
MCHC: 28.1 g/dL — AB (ref 30.0–36.0)
MCV: 83.9 fL (ref 78.0–100.0)
PLATELETS: 271 10*3/uL (ref 150–400)
RBC: 3.73 MIL/uL — ABNORMAL LOW (ref 3.87–5.11)
RDW: 17.2 % — ABNORMAL HIGH (ref 11.5–15.5)
WBC: 15.8 10*3/uL — ABNORMAL HIGH (ref 4.0–10.5)

## 2014-04-17 MED ORDER — ASPIRIN 81 MG PO CHEW
81.0000 mg | CHEWABLE_TABLET | ORAL | Status: AC
  Administered 2014-04-18: 81 mg via ORAL
  Filled 2014-04-17: qty 1

## 2014-04-17 MED ORDER — GUAIFENESIN-DM 100-10 MG/5ML PO SYRP: 5.0000 mL | ORAL_SOLUTION | ORAL | Status: DC | PRN

## 2014-04-17 MED ORDER — FUROSEMIDE 40 MG PO TABS: 40.0000 mg | ORAL_TABLET | Freq: Every day | ORAL | Status: AC

## 2014-04-17 MED ORDER — SODIUM CHLORIDE 0.9 % IV SOLN: 250.0000 mL | INTRAVENOUS | Status: DC | PRN

## 2014-04-17 MED ORDER — ACETAMINOPHEN 325 MG PO TABS
650.0000 mg | ORAL_TABLET | ORAL | Status: DC | PRN
  Administered 2014-04-17: 325 mg via ORAL
  Administered 2014-04-18: 650 mg via ORAL
  Filled 2014-04-17: qty 2

## 2014-04-17 MED ORDER — SODIUM CHLORIDE 0.9 % IJ SOLN
3.0000 mL | Freq: Two times a day (BID) | INTRAMUSCULAR | Status: DC
  Administered 2014-04-18: 3 mL via INTRAVENOUS

## 2014-04-17 MED ORDER — DEXTROSE 5 % IV SOLN
500.0000 mg | INTRAVENOUS | Status: DC
  Administered 2014-04-17 – 2014-04-22 (×6): 500 mg via INTRAVENOUS
  Filled 2014-04-17 (×6): qty 500

## 2014-04-17 MED ORDER — SODIUM CHLORIDE 0.9 % IV SOLN
1.0000 mL/kg/h | INTRAVENOUS | Status: DC
  Administered 2014-04-18: 1 mL/kg/h via INTRAVENOUS

## 2014-04-17 MED ORDER — ENOXAPARIN SODIUM 40 MG/0.4ML ~~LOC~~ SOLN
40.0000 mg | Freq: Once | SUBCUTANEOUS | Status: AC
  Administered 2014-04-17: 40 mg via SUBCUTANEOUS
  Filled 2014-04-17: qty 0.4

## 2014-04-17 MED ORDER — SODIUM CHLORIDE 0.9 % IJ SOLN: 3.0000 mL | INTRAMUSCULAR | Status: DC | PRN

## 2014-04-17 MED ORDER — FLUMAZENIL 0.5 MG/5ML IV SOLN: 0.2000 mg | Freq: Once | INTRAVENOUS | Status: DC

## 2014-04-17 MED ORDER — CLONAZEPAM 0.5 MG PO TABS: 0.2500 mg | ORAL_TABLET | Freq: Three times a day (TID) | ORAL | Status: DC | PRN

## 2014-04-17 MED ORDER — CEFTRIAXONE SODIUM IN DEXTROSE 20 MG/ML IV SOLN
1.0000 g | INTRAVENOUS | Status: DC
  Administered 2014-04-17 – 2014-04-22 (×6): 1 g via INTRAVENOUS
  Filled 2014-04-17 (×7): qty 50

## 2014-04-17 MED ORDER — PREDNISONE 1 MG PO TABS: ORAL_TABLET | ORAL | Status: DC

## 2014-04-17 MED ORDER — PANTOPRAZOLE SODIUM 40 MG PO TBEC: 40.0000 mg | DELAYED_RELEASE_TABLET | Freq: Every day | ORAL | Status: DC

## 2014-04-17 MED ORDER — AZITHROMYCIN 500 MG PO TABS: 500.0000 mg | ORAL_TABLET | Freq: Every day | ORAL | Status: DC

## 2014-04-17 NOTE — Progress Notes (Addendum)
[facility] PROGRESS NOTE  GINA LEBLOND VPX:[id number] DOB: 11-08-1940 DOA: 04/15/2014 PCP: Sherrie Mustache, MD  Assessment/Plan: Active Problems:   Essential hypertension   Coronary atherosclerosis   Acute-on-chronic respiratory failure   Acute exacerbation of chronic obstructive pulmonary disease (COPD)   Anxiety disorder   Chronic diastolic congestive heart failure   COPD (chronic obstructive pulmonary disease)   Elevated troponin   Chest pain   Congestive heart disease   Shortness of breath Multifactorial , CHF/COPD exacerbation Continue telemetry Continue medications for acute on chronic diastolic congestive heart failure and COPD exacerbation Chest x-ray does not show any significant pulmonary congestion D-dimer negative,, troponin increasing Will repeat another troponin this afternoon Continue nebulizer treatments, continue Lasix will switch to by mouth Continue IV Solu-Medrol patient received 125 mg 1 continue 40 mg IV every 6 hours 2-D echo  Findings as below Leukocytosis improving,   we'll start the patient on empiric abx   Essential hypertension-continue diltiazem, by mouth Lasix    Acute-on-chronic respiratory failure , oxygen dependent at home on 4 L ABG shows hypercapnia ,  At this point the patient does not seem to need a BiPAP   however the patient becomes more somnolent she will need to be placed on a BiPAP  exacerbated by Klonopin , therefore will reduce dose and not given any today flumazel  1 dose    History of CVA Continue aspirin and Plavix  Coronary artery disease Continue with aspirin and Plavix, and cycle cardiac enzymes Troponin trending up  Cardiology consulted to make further recommendations   Transient CP/Elevated troponin- Cardiac catheterization on 04/06/10 demonstrated a totally occluded LAD with right to left collaterals, a nodular density in the prox CFX that occupied about 70% of the prox vessel, and preserved LVF. It  was felt that her CAD should be treated medically.  -- Continue with aspirin and Plavix, statin. Likely cannot tolerate b-blocker due to severe COPD.  -- Troponin elevated --> 0.32--> 0.57--> 0.35 . ECG with new TWI laterally. Chronic RBBB. -- 2D ECHO with new low EF and WMA   cardiology recommended cardiac cath but she refused and wanted to go home on medical therapy. She now has changed her mind and would like to proceed with cath. Anticipated to have an tomorrow  nothing by mouth past midnight  Gastroesophageal reflux Continue with Protonix  Atrial fibrillation continue with amiodarone   Anxiety disorder-continue with Klonopin     Code Status: full Family Communication: patient's husband is by the bedside Disposition Plan:  Cardiac cath tomorrow   Brief narrative: HPI:  74 yr old with CAD, hypertension, hyperlipidemia, SVT, AAA, COPD, bladder cancer and cerebrovascular disease sent from cardiology office for dyspnea that has worsened over the past 1 week. She has severe dyspnea with any activity and some dyspnea at rest. No fevers, chills or productive cough. No hemoptysis. She denies orthopnea or PND but she has had increased weight. She saw Dr. Stanford Breed her cardiologist today who increased her Lasix and recommended admission for acute on chronic heart failure however patient refused. When she was outside the medical building she did develop some chest pain and increased shortness of breath and EMS was called. She was brought here to the emergency room. Upon arrival pt was pale with sats of 91% on 4 L, tachypneic in 30's with pressure of 90 sbp. EKG showed RBB. EMS gave 5 mg albuterol . She had about a 20-30 minutes some tightness in the center of her chest. She denies  any chest pain currently. She still feels short of breath. She was given one nebulizer treatment around and feels a little bit better. She's on baseline oxygen at 4 L/m. She has had some increased cough but no  change in color of her sputum. She denies any known fevers. She denies any nausea or vomiting ER workup : chest x-ray does not show edema. Her BNP is mildly elevated. There is no evidence of pneumonia. She did have some chest pain but her troponin is negative. Her EKG shows a right bundle branch block which is new from her past EKG but no ST elevation is noted. She's been given another nebulizer treatment here in the ED as well as Solu-Medrol. She's feeling better ,admitted for CHF ,COPD exacerbation.   Consultants:  Cardiology  Procedures:  None  Antibiotics: None  HPI/Subjective:   Patient complaining of left-sided chest pain with radiation to the neck , somnolent but arousable this morning and answering questions appropriately  Objective: Filed Vitals:   04/16/14 2126 04/17/14 0116 04/17/14 0644 04/17/14 0840  BP: 119/53 122/51 123/58   Pulse: 83 84 79   Temp: 97.7 F (36.5 C) 97.9 F (36.6 C) 97.6 F (36.4 C)   TempSrc: Oral Oral Oral   Resp: 19 18 18    Height:      Weight:   82.146 kg (181 lb 1.6 oz)   SpO2: 100% 100% 98% 98%    Intake/Output Summary (Last 24 hours) at 04/17/14 1130 Last data filed at 04/17/14 1059  Gross per 24 hour  Intake   1032 ml  Output    950 ml  Net     82 ml    Exam:  General: No acute respiratory distress Lungs: Clear to auscultation bilaterally without wheezes or crackles Cardiovascular: Regular rate and rhythm without murmur gallop or rub normal S1 and S2 Abdomen: Nontender, nondistended, soft, bowel sounds positive, no rebound, no ascites, no appreciable mass Extremities: No significant cyanosis, clubbing, or edema bilateral lower extremities      Data Reviewed: Basic Metabolic Panel:  Recent Labs Lab 04/15/14 1417 04/15/14 1423 04/15/14 1700 04/16/14 0535 04/17/14 0430  NA 141 138  --  140 139  K 3.5 3.5  --  3.5 3.4*  CL 94* 92*  --  93* 93*  CO2 38* 37*  --  41* 38*  GLUCOSE 103* 118*  --  140* 143*  BUN 12 9   --  9 16  CREATININE 0.65 0.69 0.72 0.61 0.72  CALCIUM 9.3 9.3  --  8.7 8.8  MG  --   --  1.7  --   --     Liver Function Tests:  Recent Labs Lab 04/15/14 1417 04/15/14 1700 04/16/14 0535 04/17/14 0430  AST 19 24 26 22   ALT 22 25 24 23   ALKPHOS 55 53 50 44  BILITOT 0.5 0.7 0.7 0.4  PROT 6.6 6.0 5.7* 5.5*  ALBUMIN 3.9 3.7 3.3* 3.2*   No results for input(s): LIPASE, AMYLASE in the last 168 hours. No results for input(s): AMMONIA in the last 168 hours.  CBC:  Recent Labs Lab 04/15/14 1417 04/15/14 1423 04/15/14 1700 04/16/14 0535 04/17/14 0430  WBC 17.8* 16.5* 19.1* 9.8 15.8*  NEUTROABS 14.9* 13.2*  --   --   --   HGB 10.0* 10.2* 9.4* 8.8* 8.8*  HCT 31.8* 36.4 33.3* 31.6* 31.3*  MCV 79.2 85.8 83.9 83.6 83.9  PLT 348.0 317 282 281 271    Cardiac Enzymes:  Recent Labs Lab 04/15/14 1700 04/15/14 2307 04/16/14 0535 04/16/14 1358  TROPONINI <0.03 0.32* 0.57* 0.35*   BNP (last 3 results)  Recent Labs  04/15/14 1423  BNP 229.9*    ProBNP (last 3 results)  Recent Labs  11/11/13 1424 12/29/13 1238 04/15/14 1417  PROBNP 671.3* 195.0* 230.0*      CBG: No results for input(s): GLUCAP in the last 168 hours.  No results found for this or any previous visit (from the past 240 hour(s)).   Studies: Dg Chest 1 View  04/15/2014   CLINICAL DATA:  74 year old female with a history of shortness of breath, hypertension  EXAM: CHEST  1 VIEW  COMPARISON:  11/11/2013  FINDINGS: Cardiomediastinal silhouette unchanged in size and contour with cardiomegaly.  Atherosclerotic calcifications of the aortic arch.  No evidence of pulmonary vascular congestion.  No pneumothorax.  Persistent opacity at the base of the right lung, present on comparison plain film studies.  Retrocardiac region not well evaluated.  No definite confluent airspace disease.  No displaced fracture.  IMPRESSION: No evidence of pulmonary edema, with stable cardiomegaly and atherosclerotic changes.   Retrocardiac region not well visualized, and if there is were further concern for acute process, a dedicated PA and lateral chest x-ray would be useful.  Persistent opacity at the right base, likely reflective of atelectasis and/ or scarring. If there were a need to further characterize the finding a CT would be required.  Signed,  Dulcy Fanny. Earleen Newport, DO  [facility]  [facility]   Electronically Signed   By: Corrie Mckusick D.O.   On: 04/15/2014 17:09   Dg Chest Port 1 View  04/15/2014   CLINICAL DATA:  Chest pain and difficulty breathing acute onset  EXAM: PORTABLE CHEST - 1 VIEW  COMPARISON:  Study obtained earlier in the day; November 11, 2013  FINDINGS: There is underlying emphysematous change. There is chronic pleural thickening on the right which appears stable. There is no frank edema or consolidation. Heart is enlarged with pulmonary vascularity within normal limits. There is atherosclerotic change in the aorta. No adenopathy. No bone lesions.  IMPRESSION: Stable cardiomegaly. Chronic pleural thickening on the right. Underlying emphysema. No frank edema or consolidation.   Electronically Signed   By: Lowella Grip III M.D.   On: 04/15/2014 14:49    Scheduled Meds: . amiodarone  200 mg Oral QHS  . aspirin EC  81 mg Oral Daily  . budesonide  0.25 mg Nebulization BID  . clopidogrel  75 mg Oral QHS  . diltiazem  300 mg Oral Daily  . furosemide  40 mg Oral Daily  . ipratropium  0.5 mg Nebulization TID  . latanoprost  1 drop Both Eyes QHS  . methylPREDNISolone (SOLU-MEDROL) injection  40 mg Intravenous Q6H  . nystatin  5 mL Mouth/Throat QID  . pantoprazole  40 mg Oral Daily  . pravastatin  80 mg Oral QHS  . sodium chloride  3 mL Intravenous Q12H  . sodium chloride  3 mL Intravenous Q12H   Continuous Infusions:   Active Problems:   Essential hypertension   Coronary atherosclerosis   Acute-on-chronic respiratory failure   Acute exacerbation  of chronic obstructive pulmonary disease (COPD)   Anxiety disorder   Chronic diastolic congestive heart failure   COPD (chronic obstructive pulmonary disease)   Elevated troponin   Chest pain   Congestive heart disease    Time spent: 40 minutes   [facility] Pager [phone].  If 7PM-7AM, please contact night-coverage at [email or web address], password [facility] 04/17/2014, 11:30 AM  LOS: 2 days

## 2014-04-17 NOTE — Evaluation (Signed)
Physical Therapy Evaluation Patient Details Name: [name] [name] MRN: [id number] DOB: 04-Nov-1940 Today's Date: 04/17/2014   History of Present Illness  74 yr old with CAD, hypertension, hyperlipidemia, SVT, AAA, COPD, bladder cancer and cerebrovascular disease sent from cardiology office for dyspnea that has worsened over the past 1 week. She has severe dyspnea with any activity and some dyspnea at rest.   Clinical Impression  Pt admitted with above diagnosis. Pt currently with functional limitations due to the deficits listed below (see PT Problem List).  Pt will benefit from skilled PT to increase their independence and safety with mobility to allow discharge to the venue listed below.       Follow Up Recommendations Home health PT;Supervision for mobility/OOB    Equipment Recommendations  None recommended by PT    Recommendations for Other Services       Precautions / Restrictions        Mobility  Bed Mobility Overal bed mobility: Needs Assistance Bed Mobility: Supine to Sit;Sit to Supine     Supine to sit: Min guard Sit to supine: Min guard   General bed mobility comments: use of bedrail  Transfers Overall transfer level: Needs assistance Equipment used: None Transfers: Sit to/from [facility] Sit to Stand: Min assist Stand pivot transfers: Min assist          Ambulation/Gait                Stairs            Wheelchair Mobility    Modified Rankin (Stroke Patients Only)       Balance                                             Pertinent Vitals/Pain Pain Assessment: 0-10 Pain Score: 5  Pain Descriptors / Indicators: Headache Pain Intervention(s): Patient requesting pain meds-RN notified;Limited activity within patient's tolerance    Home Living Family/patient expects to be discharged to:: Private residence Living Arrangements: Spouse/significant other Available Help at Discharge: Family;Available 24  hours/day Type of Home: Mobile home Home Access: Stairs to enter Entrance Stairs-Rails: Right;Left Entrance Stairs-Number of Steps: 5 Home Layout: One level Home Equipment: Walker - 2 wheels;Walker - 4 wheels;Bedside commode;Wheelchair - manual      Prior Function Level of Independence: Independent with assistive device(s)               Hand Dominance   Dominant Hand: Right    Extremity/Trunk Assessment               Lower Extremity Assessment: Generalized weakness         Communication   Communication: No difficulties  Cognition Arousal/Alertness: Awake/alert Behavior During Therapy: Anxious Overall Cognitive Status: Within Functional Limits for tasks assessed                      General Comments      Exercises        Assessment/Plan    PT Assessment Patient needs continued PT services  PT Diagnosis Difficulty walking;Generalized weakness   PT Problem List Decreased strength;Decreased activity tolerance;Decreased balance;Decreased mobility;Pain;Decreased safety awareness;Cardiopulmonary status limiting activity  PT Treatment Interventions DME instruction;Gait training;Functional mobility training;Therapeutic activities;Therapeutic exercise;Patient/family education;Balance training   PT Goals (Current goals can be found in the Care Plan section) Acute Rehab PT Goals Patient Stated Goal: home  PT Goal Formulation: With patient Time For Goal Achievement: 05/01/14 Potential to Achieve Goals: Good    Frequency Min 3X/week   Barriers to discharge        Co-evaluation               End of Session Equipment Utilized During Treatment: Gait belt;Oxygen Activity Tolerance: Patient limited by fatigue;Treatment limited secondary to medical complications (Comment) (dypsnea) Patient left: in bed;with nursing/sitter in room;with family/visitor present;with call bell/phone within reach Nurse Communication: Mobility status         Time:  1031-1050 PT Time Calculation (min) (ACUTE ONLY): 19 min   Charges:   PT Evaluation $Initial PT Evaluation Tier I: 1 Procedure     PT G Codes:        Lorriane Shire 04/17/2014, 11:51 AM

## 2014-04-17 NOTE — Progress Notes (Signed)
Utilization Review Completed.[name] [name] T2/21/2016  

## 2014-04-17 NOTE — Progress Notes (Signed)
Patient Name: [name] [name] Date of Encounter: 04/17/2014     Active Problems:   Essential hypertension   Acute-on-chronic respiratory failure   Acute exacerbation of chronic obstructive pulmonary disease (COPD)   Anxiety disorder   Chronic diastolic congestive heart failure   COPD (chronic obstructive pulmonary disease)    SUBJECTIVE  No further CP or SOB. Wants to proceed with cath tomorrow.   CURRENT MEDS . amiodarone  200 mg Oral QHS  . aspirin EC  81 mg Oral Daily  . budesonide  0.25 mg Nebulization BID  . clopidogrel  75 mg Oral QHS  . diltiazem  300 mg Oral Daily  . furosemide  40 mg Oral Daily  . ipratropium  0.5 mg Nebulization TID  . latanoprost  1 drop Both Eyes QHS  . methylPREDNISolone (SOLU-MEDROL) injection  40 mg Intravenous Q6H  . nystatin  5 mL Mouth/Throat QID  . pantoprazole  40 mg Oral Daily  . pravastatin  80 mg Oral QHS  . sodium chloride  3 mL Intravenous Q12H  . sodium chloride  3 mL Intravenous Q12H    OBJECTIVE  Filed Vitals:   04/16/14 2126 04/17/14 0116 04/17/14 0644 04/17/14 0840  BP: 119/53 122/51 123/58   Pulse: 83 84 79   Temp: 97.7 F (36.5 C) 97.9 F (36.6 C) 97.6 F (36.4 C)   TempSrc: Oral Oral Oral   Resp: 19 18 18    Height:      Weight:   181 lb 1.6 oz (82.146 kg)   SpO2: 100% 100% 98% 98%    Intake/Output Summary (Last 24 hours) at 04/17/14 0909 Last data filed at 04/17/14 0700  Gross per 24 hour  Intake    852 ml  Output    950 ml  Net    -98 ml   Filed Weights   04/15/14 1749 04/16/14 0553 04/17/14 0644  Weight: 181 lb 1.6 oz (82.146 kg) 179 lb 12.8 oz (81.557 kg) 181 lb 1.6 oz (82.146 kg)    PHYSICAL EXAM  General: Well developed, well nourished, female in no acute distress. obese Head: Eyes PERRLA, No xanthomas. Normocephalic and atraumatic, oropharynx without edema or exudate. Dentition:  Lungs: markedly decreased breath sounds throughout. No wheezes Heart: HRRR S1 S2, no rub/gallop, Heart irregular  rate and rhythm with S1, S2 murmur. pulses are 2+ extrem.  Neck: No carotid bruits. No lymphadenopathy. no JVD. Abdomen: Bowel sounds present, abdomen soft and non-tender without masses or hernias noted. Msk: No spine or cva tenderness. No weakness, no joint deformities or effusions. Extremities: No clubbing or cyanosis. no edema.  Neuro: Alert and oriented X 3. No focal deficits noted. Psych: Good affect, responds appropriately Skin: No rashes or lesions noted. Accessory Clinical Findings  CBC  Recent Labs  04/15/14 1417 04/15/14 1423  04/16/14 0535 04/17/14 0430  WBC 17.8* 16.5*  < > 9.8 15.8*  NEUTROABS 14.9* 13.2*  --   --   --   HGB 10.0* 10.2*  < > 8.8* 8.8*  HCT 31.8* 36.4  < > 31.6* 31.3*  MCV 79.2 85.8  < > 83.6 83.9  PLT 348.0 317  < > 281 271  < > = values in this interval not displayed. Basic Metabolic Panel  Recent Labs  04/15/14 1700 04/16/14 0535 04/17/14 0430  NA  --  140 139  K  --  3.5 3.4*  CL  --  93* 93*  CO2  --  41* 38*  GLUCOSE  --  140* 143*  BUN  --  9 16  CREATININE 0.72 0.61 0.72  CALCIUM  --  8.7 8.8  MG 1.7  --   --    Liver Function Tests  Recent Labs  04/16/14 0535 04/17/14 0430  AST 26 22  ALT 24 23  ALKPHOS 50 44  BILITOT 0.7 0.4  PROT 5.7* 5.5*  ALBUMIN 3.3* 3.2*   No results for input(s): LIPASE, AMYLASE in the last 72 hours. Cardiac Enzymes  Recent Labs  04/15/14 2307 04/16/14 0535 04/16/14 1358  TROPONINI 0.32* 0.57* 0.35*   BNP Invalid input(s): POCBNP D-Dimer  Recent Labs  04/15/14 1700  DDIMER 0.37    Thyroid Function Tests  Recent Labs  04/15/14 1700  TSH 0.939    TELE RBBB, PVCs  Radiology/Studies  Dg Chest 1 View  04/15/2014   CLINICAL DATA:  74 year old female with a history of shortness of breath, hypertension  EXAM: CHEST  1 VIEW  COMPARISON:  11/11/2013  FINDINGS: Cardiomediastinal silhouette unchanged in size and contour with cardiomegaly.  Atherosclerotic calcifications of  the aortic arch.  No evidence of pulmonary vascular congestion.  No pneumothorax.  Persistent opacity at the base of the right lung, present on comparison plain film studies.  Retrocardiac region not well evaluated.  No definite confluent airspace disease.  No displaced fracture.  IMPRESSION: No evidence of pulmonary edema, with stable cardiomegaly and atherosclerotic changes.  Retrocardiac region not well visualized, and if there is were further concern for acute process, a dedicated PA and lateral chest x-ray would be useful.  Persistent opacity at the right base, likely reflective of atelectasis and/ or scarring. If there were a need to further characterize the finding a CT would be required.  Signed,  Dulcy Fanny. Earleen Newport, DO  [facility]  [facility]   Electronically Signed   By: Corrie Mckusick D.O.   On: 04/15/2014 17:09   Dg Chest Port 1 View  04/15/2014   CLINICAL DATA:  Chest pain and difficulty breathing acute onset  EXAM: PORTABLE CHEST - 1 VIEW  COMPARISON:  Study obtained earlier in the day; November 11, 2013  FINDINGS: There is underlying emphysematous change. There is chronic pleural thickening on the right which appears stable. There is no frank edema or consolidation. Heart is enlarged with pulmonary vascularity within normal limits. There is atherosclerotic change in the aorta. No adenopathy. No bone lesions.  IMPRESSION: Stable cardiomegaly. Chronic pleural thickening on the right. Underlying emphysema. No frank edema or consolidation.   Electronically Signed   By: Lowella Grip III M.D.   On: 04/15/2014 14:49    2D ECHO Study Date: 04/16/2014 LV EF: 40% -   45% Study Conclusions - Left ventricle: LV strain is abnormal at -14.9% The cavity size   was normal. There was mild concentric hypertrophy. Systolic   function was mildly to moderately reduced. The estimated ejection   fraction was in the range of 40% to 45%. There is akinesis of  the   mid-apicalinferoseptal myocardium. There is akinesis of the   apicalinferior myocardium. There is akinesis of the   mid-apicalanteroseptal myocardium. There was an increased   relative contribution of atrial contraction to ventricular   filling. Doppler parameters are consistent with abnormal left   ventricular relaxation (grade 1 diastolic dysfunction). - Aortic valve: Trileaflet; mildly thickened, mildly calcified   leaflets. - Atrial septum: There was increased thickness of the septum,   consistent with lipomatous hypertrophy. - Pulmonary arteries: PA  peak pressure: 49 mm Hg (S).   ASSESSMENT AND PLAN  KOBY HARTFIELD is a 74 y.o. female with a history of HTN, HLD, SVT on amiodarone, AAA (4.3 x 3.8 cm ), COPD on home 02 , bladder cancer and cerebrovascular disease who was admitted to [facility] on 04/15/14 for dyspnea and chest pain. She was admitted by [facility] and treated with one dose of IV lasix and with nebs/steroids for COPD exacerbation. She did have some transient CP that resolved on its own. Her troponin was noted to be elevated and cardiology was consulted.   Acute-on-chronic respiratory failure, oxygen dependent at home on 4 L -- ABG shows hypercapnia , likely chronic  -- Multifactorial , CHF/COPD exacerbation -- Given one dose of IV lasix 40mg . Weight stable at 181 lbs. Converted to po Lasix 40mg  qd.  -- CXR with no significant pulmonary congestion, BNP slightly elevated ~230 -- D-dimer negative -- Continue nebulizer treatments and IV Solu-Medrol patient received 125 mg 1 continue 40 mg IV every 6 hours --  2D ECHO with new low EF (40-45%) and WMA (akinesis of the apicalinferior myocardium. There is akinesis of the mid-apicalanteroseptal myocardium). G1DD, PA pressure 49. -- Initially WBC 19.1. Leukocytosis improving, despite steroids therefore no antibiotics given  Transient CP/Elevated troponin- Cardiac catheterization on 04/06/10 demonstrated a totally occluded LAD with  right to left collaterals, a nodular density in the prox CFX that occupied about 70% of the prox vessel, and preserved LVF. It was felt that her CAD should be treated medically.  -- Continue with aspirin and Plavix, statin. Likely cannot tolerate b-blocker due to severe COPD.  -- Troponin elevated --> 0.32--> 0.57--> 0.35 . ECG with new TWI laterally. Chronic RBBB. -- 2D ECHO with new low EF and WMA  -- With results of previous cath and new TWI Dr. Haroldine Laws suggested cath but she refused and wanted to go home on medical therapy. She now has changed her mind and would like to proceed with cath. I will place her on the cath board tomorrow.   Essential hypertension-continue diltiazem CD 300mg   History of CVA- Continue aspirin and Plavix  GERD- continue with Protonix  Hx of SVT/Vtac- continue with amiodarone -- TSH and LFTs normal on long term amio.  AAA- f/u US in 01/2015.   Carotid stenosis- continue ASA and statin. Followed by vascular surgery  Leukocytosis- 15.8. No fevers or chills.   Anemia- Hg. 8.8.    SignedEileen Stanford PA-C  Pager [phone]  I have personally seen and examined patient and agree with note as outlined by Angelena Form, PA. Amitted with acute on chronic CHF and had CP now with elevated troponin and depressed EF with new RWMA's agree to proceed with cardiac cath in am.  Patient understands and is willing to proceed.  Signed: Fransico Him, MD [facility] 04/17/2014

## 2014-04-17 NOTE — Progress Notes (Signed)
CRITICAL VALUE ALERT  Critical value received: pCO2 74.3  Date of notification:  04/17/2014  Time of notification:  2130  Critical value read back:yes  Nurse who received alert:  Mitzi Davenport  MD notified (1st page):  Schorr  Time of first page: 2158  MD notified (2nd page):  Time of second page:  Responding MD:    Time MD responded:

## 2014-04-18 ENCOUNTER — Encounter (HOSPITAL_COMMUNITY)
Admission: EM | Disposition: A | Discharge status: Home Health | Payer: Self-pay | Source: Home / Self Care | Attending: Internal Medicine

## 2014-04-18 DIAGNOSIS — I471 Supraventricular tachycardia: Secondary | ICD-10-CM

## 2014-04-18 DIAGNOSIS — D649 Anemia, unspecified: Secondary | ICD-10-CM

## 2014-04-18 HISTORY — PX: LEFT HEART CATHETERIZATION WITH CORONARY ANGIOGRAM: SHX5451

## 2014-04-18 LAB — CBC
HCT: 32.1 % — ABNORMAL LOW (ref 36.0–46.0)
HEMATOCRIT: 33.9 % — AB (ref 36.0–46.0)
HEMOGLOBIN: 8.8 g/dL — AB (ref 12.0–15.0)
HEMOGLOBIN: 9.3 g/dL — AB (ref 12.0–15.0)
MCH: 23.2 pg — AB (ref 26.0–34.0)
MCH: 23.4 pg — ABNORMAL LOW (ref 26.0–34.0)
MCHC: 27.4 g/dL — ABNORMAL LOW (ref 30.0–36.0)
MCHC: 27.4 g/dL — AB (ref 30.0–36.0)
MCV: 84.5 fL (ref 78.0–100.0)
MCV: 85.4 fL (ref 78.0–100.0)
PLATELETS: 317 10*3/uL (ref 150–400)
Platelets: 293 10*3/uL (ref 150–400)
RBC: 3.8 MIL/uL — ABNORMAL LOW (ref 3.87–5.11)
RBC: 3.97 MIL/uL (ref 3.87–5.11)
RDW: 16.9 % — ABNORMAL HIGH (ref 11.5–15.5)
RDW: 16.9 % — AB (ref 11.5–15.5)
WBC: 16 10*3/uL — ABNORMAL HIGH (ref 4.0–10.5)
WBC: 13.8 10*3/uL — ABNORMAL HIGH (ref 4.0–10.5)

## 2014-04-18 LAB — PROTIME-INR
INR: 0.95 (ref 0.00–1.49)
Prothrombin Time: 12.8 seconds (ref 11.6–15.2)

## 2014-04-18 LAB — POTASSIUM: Potassium: 3.5 mmol/L (ref 3.5–5.1)

## 2014-04-18 LAB — CREATININE, SERUM
CREATININE: 0.61 mg/dL (ref 0.50–1.10)
GFR calc Af Amer: >90 mL/min (ref >90)
GFR calc non Af Amer: 88 mL/min — ABNORMAL LOW (ref >90)

## 2014-04-18 SURGERY — LEFT HEART CATHETERIZATION WITH CORONARY ANGIOGRAM

## 2014-04-18 MED ORDER — METHYLPREDNISOLONE SODIUM SUCC 40 MG IJ SOLR
40.0000 mg | Freq: Two times a day (BID) | INTRAMUSCULAR | Status: DC
  Administered 2014-04-19: 40 mg via INTRAVENOUS
  Filled 2014-04-18 (×3): qty 1

## 2014-04-18 MED ORDER — ACETAMINOPHEN 325 MG PO TABS
650.0000 mg | ORAL_TABLET | ORAL | Status: DC | PRN
  Administered 2014-04-19 – 2014-04-20 (×5): 650 mg via ORAL
  Filled 2014-04-18 (×6): qty 2

## 2014-04-18 MED ORDER — POTASSIUM CHLORIDE CRYS ER 20 MEQ PO TBCR
40.0000 meq | EXTENDED_RELEASE_TABLET | Freq: Once | ORAL | Status: AC
  Administered 2014-04-18: 40 meq via ORAL
  Filled 2014-04-18: qty 2

## 2014-04-18 MED ORDER — HEPARIN SODIUM (PORCINE) 1000 UNIT/ML IJ SOLN
INTRAMUSCULAR | Status: AC
  Filled 2014-04-18: qty 1

## 2014-04-18 MED ORDER — FENTANYL CITRATE 0.05 MG/ML IJ SOLN
INTRAMUSCULAR | Status: AC
  Filled 2014-04-18: qty 2

## 2014-04-18 MED ORDER — VERAPAMIL HCL 2.5 MG/ML IV SOLN
INTRAVENOUS | Status: AC
  Filled 2014-04-18: qty 2

## 2014-04-18 MED ORDER — LIDOCAINE HCL (PF) 1 % IJ SOLN
INTRAMUSCULAR | Status: AC
  Filled 2014-04-18: qty 30

## 2014-04-18 MED ORDER — MIDAZOLAM HCL 2 MG/2ML IJ SOLN
INTRAMUSCULAR | Status: AC
  Filled 2014-04-18: qty 2

## 2014-04-18 MED ORDER — NITROGLYCERIN 1 MG/10 ML FOR IR/CATH LAB
INTRA_ARTERIAL | Status: AC
  Filled 2014-04-18: qty 10

## 2014-04-18 MED ORDER — ONDANSETRON HCL 4 MG/2ML IJ SOLN
4.0000 mg | Freq: Four times a day (QID) | INTRAMUSCULAR | Status: DC | PRN
  Administered 2014-04-18: 4 mg via INTRAVENOUS
  Filled 2014-04-18: qty 2

## 2014-04-18 MED ORDER — HEPARIN (PORCINE) IN NACL 2-0.9 UNIT/ML-% IJ SOLN
INTRAMUSCULAR | Status: AC
  Filled 2014-04-18: qty 1500

## 2014-04-18 MED ORDER — ENOXAPARIN SODIUM 40 MG/0.4ML ~~LOC~~ SOLN
40.0000 mg | SUBCUTANEOUS | Status: DC
  Administered 2014-04-19 – 2014-04-22 (×3): 40 mg via SUBCUTANEOUS
  Filled 2014-04-18 (×5): qty 0.4

## 2014-04-18 MED ORDER — ACETAMINOPHEN-CODEINE #3 300-30 MG PO TABS
1.0000 | ORAL_TABLET | Freq: Once | ORAL | Status: AC
  Administered 2014-04-18: 1 via ORAL
  Filled 2014-04-18: qty 1

## 2014-04-18 MED ORDER — SODIUM CHLORIDE 0.9 % IV SOLN
INTRAVENOUS | Status: AC
  Administered 2014-04-18: 16:00:00 via INTRAVENOUS

## 2014-04-18 MED ORDER — FUROSEMIDE 10 MG/ML IJ SOLN
20.0000 mg | Freq: Once | INTRAMUSCULAR | Status: AC
  Administered 2014-04-18: 20 mg via INTRAVENOUS
  Filled 2014-04-18: qty 2

## 2014-04-18 NOTE — Interval H&P Note (Signed)
History and Physical Interval Note:  04/18/2014 2:43 PM  [name] [name]  has presented today for surgery, with the diagnosis of cp  The various methods of treatment have been discussed with the patient and family. After consideration of risks, benefits and other options for treatment, the patient has consented to  Procedure(s): LEFT HEART CATHETERIZATION WITH CORONARY ANGIOGRAM (N/A) and possible angioplasty as a surgical intervention .  The patient's history has been reviewed, patient examined, no change in status, stable for surgery.  I have reviewed the patient's chart and labs.  Questions were answered to the patient's satisfaction.     Daniel Bensimhon

## 2014-04-18 NOTE — H&P (View-Only) (Signed)
Patient: [name] [name] / Admit Date: 04/15/2014 / Date of Encounter: 04/18/2014, 8:23 AM   Subjective: No CP or SOB. Resting comfortably. Denies any bleeding/melena/hematemesis/BRBPR - just bruises easily. She is upset that she was told the cath would definitely be done radially but someone told her earlier today there is a possibility it could be done through the groin as well.   Objective: Telemetry: NSR Physical Exam: Blood pressure 117/48, pulse 88, temperature 97.4 F (36.3 C), temperature source Oral, resp. rate 18, height 5\' 5"  (1.651 m), weight 182 lb 3.2 oz (82.645 kg), SpO2 98 %. General: Well developed, well nourished WF in no acute distress. Lying flat in bed on her side. Head: Normocephalic, atraumatic, sclera non-icteric, no xanthomas, nares are without discharge. Neck: JVP not elevated. Lungs: Significantly diminished BS throughout without wheezes, rales, or rhonchi. Breathing is unlabored. Heart: RRR S1 S2 without murmurs, rubs, or gallops.  Abdomen: Soft, non-tender, non-distended with normoactive bowel sounds. No rebound/guarding. Extremities: No clubbing or cyanosis. No edema. Distal pedal pulses are 2+ and equal bilaterally. Neuro: Alert and oriented X 3. Moves all extremities spontaneously. Psych:  Responds to questions appropriately with a normal affect.   Intake/Output Summary (Last 24 hours) at 04/18/14 0823 Last data filed at 04/18/14 0500  Gross per 24 hour  Intake 1620.21 ml  Output   1050 ml  Net 570.21 ml    Inpatient Medications:  . amiodarone  200 mg Oral QHS  . aspirin EC  81 mg Oral Daily  . azithromycin  500 mg Intravenous Q24H  . budesonide  0.25 mg Nebulization BID  . cefTRIAXone (ROCEPHIN)  IV  1 g Intravenous Q24H  . clopidogrel  75 mg Oral QHS  . diltiazem  300 mg Oral Daily  . flumazenil  0.2 mg Intravenous Once  . furosemide  40 mg Oral Daily  . ipratropium  0.5 mg Nebulization TID  . latanoprost  1 drop Both Eyes QHS  .  methylPREDNISolone (SOLU-MEDROL) injection  40 mg Intravenous Q6H  . nystatin  5 mL Mouth/Throat QID  . pantoprazole  40 mg Oral Daily  . pravastatin  80 mg Oral QHS  . sodium chloride  3 mL Intravenous Q12H  . sodium chloride  3 mL Intravenous Q12H  . sodium chloride  3 mL Intravenous Q12H   Infusions:  . sodium chloride 1 mL/kg/hr (04/18/14 0416)    Labs:  Recent Labs  04/15/14 1700 04/16/14 0535 04/17/14 0430  NA  --  140 139  K  --  3.5 3.4*  CL  --  93* 93*  CO2  --  41* 38*  GLUCOSE  --  140* 143*  BUN  --  9 16  CREATININE 0.72 0.61 0.72  CALCIUM  --  8.7 8.8  MG 1.7  --   --     Recent Labs  04/16/14 0535 04/17/14 0430  AST 26 22  ALT 24 23  ALKPHOS 50 44  BILITOT 0.7 0.4  PROT 5.7* 5.5*  ALBUMIN 3.3* 3.2*    Recent Labs  04/15/14 1417 04/15/14 1423  04/17/14 0430 04/18/14 0554  WBC 17.8* 16.5*  < > 15.8* 16.0*  NEUTROABS 14.9* 13.2*  --   --   --   HGB 10.0* 10.2*  < > 8.8* 8.8*  HCT 31.8* 36.4  < > 31.3* 32.1*  MCV 79.2 85.8  < > 83.9 84.5  PLT 348.0 317  < > 271 293  < > = values in this interval  not displayed.  Recent Labs  04/15/14 1700 04/15/14 2307 04/16/14 0535 04/16/14 1358  TROPONINI <0.03 0.32* 0.57* 0.35*   Invalid input(s): POCBNP No results for input(s): HGBA1C in the last 72 hours.   Radiology/Studies:  Dg Chest 1 View  04/15/2014   CLINICAL DATA:  74 year old female with a history of shortness of breath, hypertension  EXAM: CHEST  1 VIEW  COMPARISON:  11/11/2013  FINDINGS: Cardiomediastinal silhouette unchanged in size and contour with cardiomegaly.  Atherosclerotic calcifications of the aortic arch.  No evidence of pulmonary vascular congestion.  No pneumothorax.  Persistent opacity at the base of the right lung, present on comparison plain film studies.  Retrocardiac region not well evaluated.  No definite confluent airspace disease.  No displaced fracture.  IMPRESSION: No evidence of pulmonary edema, with stable  cardiomegaly and atherosclerotic changes.  Retrocardiac region not well visualized, and if there is were further concern for acute process, a dedicated PA and lateral chest x-ray would be useful.  Persistent opacity at the right base, likely reflective of atelectasis and/ or scarring. If there were a need to further characterize the finding a CT would be required.  Signed,  Dulcy Fanny. Earleen Newport, DO  [facility]  [facility]   Electronically Signed   By: Corrie Mckusick D.O.   On: 04/15/2014 17:09   Dg Chest Port 1 View  04/15/2014   CLINICAL DATA:  Chest pain and difficulty breathing acute onset  EXAM: PORTABLE CHEST - 1 VIEW  COMPARISON:  Study obtained earlier in the day; November 11, 2013  FINDINGS: There is underlying emphysematous change. There is chronic pleural thickening on the right which appears stable. There is no frank edema or consolidation. Heart is enlarged with pulmonary vascularity within normal limits. There is atherosclerotic change in the aorta. No adenopathy. No bone lesions.  IMPRESSION: Stable cardiomegaly. Chronic pleural thickening on the right. Underlying emphysema. No frank edema or consolidation.   Electronically Signed   By: Lowella Grip III M.D.   On: 04/15/2014 14:49     Assessment and Plan   74 y.o. female with HTN, HLD, SVT on amiodarone, AAA (4.3 x 3.8 cm ), COPD on home O2, bladder cancer and cerebrovascular disease admitted 04/15/14 for dyspnea and chest pain. She was admitted by [facility] and treated with one dose of IV lasix and with nebs/steroids for COPD exacerbation. Transient CP that resolved on its own - peak troponin 0.57.   1. Acute on chronic respiratory failure (on 4L/O2 at home), likely multifactorial d/t COPD/CHF - ABG with hypercapnia, likely chronic, d-dimer negative - 2D echo with new low EF (40-45%) and WMA (akinesis of the apicalinferior myocardium, akinesis of themid-apicalanteroseptal myocardium), G1DD, PA  pressure 49 - cath team has added stat K level to this AM's labs but anticipate she'll need scheduled KCl with daily oral Lasix  2. Elevated troponin, question demand ischemia versus NSTEMI with history of CAD - cath 2012 totally occluded LAD with right to left collaterals, a nodular density in the prox CFX that occupied about 70% of the prox vessel, rx'd medically - continue aspirin, statin, Plavix (likely cannot tolerate b-blocker due to severe COPD) - for cath today - risks and benefits of cardiac catheterization have been discussed with the patient. These include bleeding, infection, kidney damage, stroke, heart attack, death. The patient understands these risks and is willing to proceed.  3. Anemia without patient-reported bleeding - Hgb 10 on admission, downtrended to 8.8 and has remained stable (  baseline appears 10-11) - will place order for hemoccult but will defer further work-up per IM  4. Essential HTN 5. H/o SVT/Vtach,on amiodarone 6. AAA, f/u due 07/2014 7. Carotid stenosis, followed by vascular surgery 8. Leukocytosis in setting of steroids  Signed, Melina Copa PA-C  Patient seen and examined. I agree with the assessment and plan as detailed above. See also my additional thoughts below.   The patient is for cath later today. I have reviewed telemetry today April 18, 2014. She continues to hold sinus rhythm on amiodarone.  Dola Argyle, MD, [facility] 04/18/2014 10:49 AM

## 2014-04-18 NOTE — Progress Notes (Addendum)
[facility] PROGRESS NOTE  MAGIN BALBI IHK:[id number] DOB: 11-07-1940 DOA: 04/15/2014 PCP: Sherrie Mustache, MD  Assessment/Plan: Active Problems:   Essential hypertension   Coronary atherosclerosis   Acute-on-chronic respiratory failure   Acute exacerbation of chronic obstructive pulmonary disease (COPD)   Anxiety disorder   Chronic diastolic congestive heart failure   COPD (chronic obstructive pulmonary disease)   Elevated troponin   Chest pain   Congestive heart disease   Acute on chronic hypoxic /hypercapnic  respiratory failure (on 4L/O2 at home), likely multifactorial d/t COPD/CHF Multifactorial , CHF/COPD exacerbation Continue telemetry Continue medications for acute on chronic diastolic congestive heart failure and COPD exacerbation Chest x-ray does not show any significant pulmonary congestion D-dimer negative,, troponin abnl Continue nebulizer treatments, continue Lasix will switch to by mouth TAPER STEROIDS ,continue 40 mg IV every 6 hours>12 HRS 2-D echo  Findings as below Cont abx ,improving    Essential hypertension-continue diltiazem, by mouth Lasix  Anemia without patient-reported bleeding - Hgb 10 on admission, downtrended to 8.8 and has remained stable (baseline appears 10-11) Check FOBT    Acute-on-chronic respiratory failure , oxygen dependent at home on 4 L ABG shows hypercapnia ,  At this point the patient does not seem to need a BiPAP  Hypercapnia exacerbated by klonopin and tylenol with codeine  Avoids sedation of any kind    History of CVA Continue aspirin and Plavix  Coronary artery disease Continue with aspirin and Plavix, and cycle cardiac enzymes Troponin trending up S/p cath 2/22  She has severe CAD and is a poor candidate for CABG. She has a high-grade proximal lesion in a dominant LCX that collateralizes her LAD. Will hydrate and plan high-risk PCI of LCX with possible rotational atherectomy on Wednesday. Will need VVS f/u  for PAD.  ontinue with aspirin and Plavix, statin. Likely cannot tolerate b-blocker due to severe COPD.  -- Troponin elevated --> 0.32--> 0.57--> 0.35 . ECG with new TWI laterally. Chronic RBBB. -- 2D ECHO with new low EF and WMA    Gastroesophageal reflux Continue with Protonix  Atrial fibrillation continue with amiodarone  Anxiety disorder-continue with Klonopin, minimal dose if needed      Code Status: full Family Communication: patient's husband is by the bedside Disposition Plan:  Repeat Cardiac cath wednesday   Brief narrative: HPI:  74 yr old with CAD, hypertension, hyperlipidemia, SVT, AAA, COPD, bladder cancer and cerebrovascular disease sent from cardiology office for dyspnea that has worsened over the past 1 week. She has severe dyspnea with any activity and some dyspnea at rest. No fevers, chills or productive cough. No hemoptysis. She denies orthopnea or PND but she has had increased weight. She saw Dr. Stanford Breed her cardiologist today who increased her Lasix and recommended admission for acute on chronic heart failure however patient refused. When she was outside the medical building she did develop some chest pain and increased shortness of breath and EMS was called. She was brought here to the emergency room. Upon arrival pt was pale with sats of 91% on 4 L, tachypneic in 30's with pressure of 90 sbp. EKG showed RBB. EMS gave 5 mg albuterol . She had about a 20-30 minutes some tightness in the center of her chest. She denies any chest pain currently. She still feels short of breath. She was given one nebulizer treatment around and feels a little bit better. She's on baseline oxygen at 4 L/m. She has had some increased cough but no change in color of her  sputum. She denies any known fevers. She denies any nausea or vomiting ER workup : chest x-ray does not show edema. Her BNP is mildly elevated. There is no evidence of pneumonia. She did have some chest pain but her  troponin is negative. Her EKG shows a right bundle branch block which is new from her past EKG but no ST elevation is noted. She's been given another nebulizer treatment here in the ED as well as Solu-Medrol. She's feeling better ,admitted for CHF ,COPD exacerbation.   Consultants:  Cardiology  Procedures:  None  Antibiotics: None  HPI/Subjective:  No CP or SOB. Resting comfortably  Objective: Filed Vitals:   04/18/14 1645 04/18/14 1650 04/18/14 1655 04/18/14 1715  BP: 128/60 134/57 121/54 130/57  Pulse: 71 74 77 75  Temp:    98.1 F (36.7 C)  TempSrc:    Oral  Resp: 15 17 16 16   Height:      Weight:      SpO2: 96% 98% 96% 96%    Intake/Output Summary (Last 24 hours) at 04/18/14 1753 Last data filed at 04/18/14 1518  Gross per 24 hour  Intake 1496.49 ml  Output   1950 ml  Net -453.51 ml    Exam:  General: No acute respiratory distress Lungs: Clear to auscultation bilaterally without wheezes or crackles Cardiovascular: Regular rate and rhythm without murmur gallop or rub normal S1 and S2 Abdomen: Nontender, nondistended, soft, bowel sounds positive, no rebound, no ascites, no appreciable mass Extremities: No significant cyanosis, clubbing, or edema bilateral lower extremities      Data Reviewed: Basic Metabolic Panel:  Recent Labs Lab 04/15/14 1417 04/15/14 1423 04/15/14 1700 04/16/14 0535 04/17/14 0430 04/18/14 0932  NA 141 138  --  140 139  --   K 3.5 3.5  --  3.5 3.4* 3.5  CL 94* 92*  --  93* 93*  --   CO2 38* 37*  --  41* 38*  --   GLUCOSE 103* 118*  --  140* 143*  --   BUN 12 9  --  9 16  --   CREATININE 0.65 0.69 0.72 0.61 0.72  --   CALCIUM 9.3 9.3  --  8.7 8.8  --   MG  --   --  1.7  --   --   --     Liver Function Tests:  Recent Labs Lab 04/15/14 1417 04/15/14 1700 04/16/14 0535 04/17/14 0430  AST 19 24 26 22   ALT 22 25 24 23   ALKPHOS 55 53 50 44  BILITOT 0.5 0.7 0.7 0.4  PROT 6.6 6.0 5.7* 5.5*  ALBUMIN 3.9 3.7 3.3* 3.2*    No results for input(s): LIPASE, AMYLASE in the last 168 hours. No results for input(s): AMMONIA in the last 168 hours.  CBC:  Recent Labs Lab 04/15/14 1417 04/15/14 1423 04/15/14 1700 04/16/14 0535 04/17/14 0430 04/18/14 0554  WBC 17.8* 16.5* 19.1* 9.8 15.8* 16.0*  NEUTROABS 14.9* 13.2*  --   --   --   --   HGB 10.0* 10.2* 9.4* 8.8* 8.8* 8.8*  HCT 31.8* 36.4 33.3* 31.6* 31.3* 32.1*  MCV 79.2 85.8 83.9 83.6 83.9 84.5  PLT 348.0 317 282 281 271 293    Cardiac Enzymes:  Recent Labs Lab 04/15/14 1700 04/15/14 2307 04/16/14 0535 04/16/14 1358  TROPONINI <0.03 0.32* 0.57* 0.35*   BNP (last 3 results)  Recent Labs  04/15/14 1423  BNP 229.9*    ProBNP (last 3 results)  Recent Labs  11/11/13 1424 12/29/13 1238 04/15/14 1417  PROBNP 671.3* 195.0* 230.0*      CBG: No results for input(s): GLUCAP in the last 168 hours.  No results found for this or any previous visit (from the past 240 hour(s)).   Studies: Dg Chest 1 View  04/15/2014   CLINICAL DATA:  74 year old female with a history of shortness of breath, hypertension  EXAM: CHEST  1 VIEW  COMPARISON:  11/11/2013  FINDINGS: Cardiomediastinal silhouette unchanged in size and contour with cardiomegaly.  Atherosclerotic calcifications of the aortic arch.  No evidence of pulmonary vascular congestion.  No pneumothorax.  Persistent opacity at the base of the right lung, present on comparison plain film studies.  Retrocardiac region not well evaluated.  No definite confluent airspace disease.  No displaced fracture.  IMPRESSION: No evidence of pulmonary edema, with stable cardiomegaly and atherosclerotic changes.  Retrocardiac region not well visualized, and if there is were further concern for acute process, a dedicated PA and lateral chest x-ray would be useful.  Persistent opacity at the right base, likely reflective of atelectasis and/ or scarring. If there were a need to further characterize the finding a CT would  be required.  Signed,  Dulcy Fanny. Earleen Newport, DO  [facility]  [facility]   Electronically Signed   By: Corrie Mckusick D.O.   On: 04/15/2014 17:09   Dg Chest Port 1 View  04/15/2014   CLINICAL DATA:  Chest pain and difficulty breathing acute onset  EXAM: PORTABLE CHEST - 1 VIEW  COMPARISON:  Study obtained earlier in the day; November 11, 2013  FINDINGS: There is underlying emphysematous change. There is chronic pleural thickening on the right which appears stable. There is no frank edema or consolidation. Heart is enlarged with pulmonary vascularity within normal limits. There is atherosclerotic change in the aorta. No adenopathy. No bone lesions.  IMPRESSION: Stable cardiomegaly. Chronic pleural thickening on the right. Underlying emphysema. No frank edema or consolidation.   Electronically Signed   By: Lowella Grip III M.D.   On: 04/15/2014 14:49    Scheduled Meds: . amiodarone  200 mg Oral QHS  . aspirin EC  81 mg Oral Daily  . azithromycin  500 mg Intravenous Q24H  . budesonide  0.25 mg Nebulization BID  . cefTRIAXone (ROCEPHIN)  IV  1 g Intravenous Q24H  . clopidogrel  75 mg Oral QHS  . diltiazem  300 mg Oral Daily  . [START ON 04/19/2014] enoxaparin (LOVENOX) injection  40 mg Subcutaneous Q24H  . flumazenil  0.2 mg Intravenous Once  . furosemide  40 mg Oral Daily  . ipratropium  0.5 mg Nebulization TID  . latanoprost  1 drop Both Eyes QHS  . methylPREDNISolone (SOLU-MEDROL) injection  40 mg Intravenous Q6H  . nystatin  5 mL Mouth/Throat QID  . pantoprazole  40 mg Oral Daily  . pravastatin  80 mg Oral QHS  . sodium chloride  3 mL Intravenous Q12H  . sodium chloride  3 mL Intravenous Q12H   Continuous Infusions: . sodium chloride 75 mL/hr at 04/18/14 1623    Active Problems:   Essential hypertension   Coronary atherosclerosis   Acute-on-chronic respiratory failure   Acute exacerbation of chronic obstructive pulmonary disease  (COPD)   Anxiety disorder   Chronic diastolic congestive heart failure   COPD (chronic obstructive pulmonary disease)   Elevated troponin   Chest pain   Congestive heart disease    Time spent: 40  minutes   [facility] Pager [phone]. If 7PM-7AM, please contact night-coverage at [email or web address], password [facility] 04/18/2014, 5:53 PM  LOS: 3 days

## 2014-04-18 NOTE — Progress Notes (Signed)
Patient complains of pressure in her bladder. States she feels like she is "not peeing enough". Bladder scan shows 870ml of urine. In and out cath performed, with output of 867ml. Patient states she now has relief. Will continue to monitor.

## 2014-04-18 NOTE — CV Procedure (Signed)
Cardiac Cath Procedure Note:  Indication: NSTEMI in setting of COPD flare  Procedures performed:  1) Selective coronary angiography 2) Left heart catheterization 3) Abdominal aortogram  Description of procedure:   The risks and indication of the procedure were explained. Consent was signed and placed on the chart. An appropriate timeout was taken prior to the procedure.   We initially cannulated the right radial artery and fed the wire into the artery. We placed the sheath over the wire but were unable to get blood return so the sheath was removed and a TR band was placed. The right groin was then prepped and draped in the routine sterile fashion and anesthetized with 1% local lidocaine.   A 5 FR arterial sheath was placed in the right femoral artery using a modified Seldinger technique. The iliac and aorta were heavily diseased and required a Wholley wire to navigate. A JL 3.5 was used for the left system and an LCB was used for the RCA which had a high anterior takeoff.  All catheter exchanges were made over a high wire approach.  Complications:  None apparent  Findings:  Ao Pressure: 132/62 (92) LV Pressure:  111/16/22 There was no signficant gradient across the aortic valve on pullback.  Left main:  Either absent or short. It appeared there was likely separate ostia for the LAD and LCX.  LAD: Totally occluded ostially with left to left collaterals. Faint right to left collaterals.   LCX: Large dominant vessel. Heavily calcified. Proximal 90% lesion. There are three marginal branches with mild plaque and a small PDA.  RCA: Small non-dominant vessel with high anterior take-off. No significant plaque. Faint right to left collaterals.  Abdomina aortogram = Large complex AAA with severe distal aortic disease and probable high-grade ostial disease in bilateral iliacs.    Assessment: 1. Severe 2-V calcific CAD as described above with totally occluded LAD and high grade proximal  disease in dominant LCX 2. Severe PAD with complex AAA as described above   Plan/Discussion:  She has severe CAD and is a poor candidate for CABG. She has a high-grade proximal lesion in a dominant LCX that collateralizes her LAD. Will hydrate and plan high-risk PCI of LCX with possible rotational atherectomy on Wednesday. Will need VVS f/u for PAD.   Kearie Mennen 3:54 PM

## 2014-04-18 NOTE — Progress Notes (Addendum)
Patient has returned to room from cath lab. Patient alert and oriented with no complaints at this time. Patient has TR band to right wrist with bruising. All air has been removed, will wait 30 minutes before removing TR band. Patient will be on bedrest until 2100. Will continue to monitor patient.

## 2014-04-18 NOTE — Progress Notes (Signed)
Site area: rt groin Site Prior to Removal:  Level  0 Pressure Applied For: 20 minutes Manual:   yes Patient Status During Pull:  stable Post Pull Site:  Level  0 Post Pull Instructions Given:  yes Post Pull Pulses Present: yes Dressing Applied:  tegaderm Bedrest begins @ [id number] Comments: no complications

## 2014-04-18 NOTE — Progress Notes (Signed)
Patient: [name] [name] / Admit Date: 04/15/2014 / Date of Encounter: 04/18/2014, 8:23 AM   Subjective: No CP or SOB. Resting comfortably. Denies any bleeding/melena/hematemesis/BRBPR - just bruises easily. She is upset that she was told the cath would definitely be done radially but someone told her earlier today there is a possibility it could be done through the groin as well.   Objective: Telemetry: NSR Physical Exam: Blood pressure 117/48, pulse 88, temperature 97.4 F (36.3 C), temperature source Oral, resp. rate 18, height 5\' 5"  (1.651 m), weight 182 lb 3.2 oz (82.645 kg), SpO2 98 %. General: Well developed, well nourished WF in no acute distress. Lying flat in bed on her side. Head: Normocephalic, atraumatic, sclera non-icteric, no xanthomas, nares are without discharge. Neck: JVP not elevated. Lungs: Significantly diminished BS throughout without wheezes, rales, or rhonchi. Breathing is unlabored. Heart: RRR S1 S2 without murmurs, rubs, or gallops.  Abdomen: Soft, non-tender, non-distended with normoactive bowel sounds. No rebound/guarding. Extremities: No clubbing or cyanosis. No edema. Distal pedal pulses are 2+ and equal bilaterally. Neuro: Alert and oriented X 3. Moves all extremities spontaneously. Psych:  Responds to questions appropriately with a normal affect.   Intake/Output Summary (Last 24 hours) at 04/18/14 0823 Last data filed at 04/18/14 0500  Gross per 24 hour  Intake 1620.21 ml  Output   1050 ml  Net 570.21 ml    Inpatient Medications:  . amiodarone  200 mg Oral QHS  . aspirin EC  81 mg Oral Daily  . azithromycin  500 mg Intravenous Q24H  . budesonide  0.25 mg Nebulization BID  . cefTRIAXone (ROCEPHIN)  IV  1 g Intravenous Q24H  . clopidogrel  75 mg Oral QHS  . diltiazem  300 mg Oral Daily  . flumazenil  0.2 mg Intravenous Once  . furosemide  40 mg Oral Daily  . ipratropium  0.5 mg Nebulization TID  . latanoprost  1 drop Both Eyes QHS  .  methylPREDNISolone (SOLU-MEDROL) injection  40 mg Intravenous Q6H  . nystatin  5 mL Mouth/Throat QID  . pantoprazole  40 mg Oral Daily  . pravastatin  80 mg Oral QHS  . sodium chloride  3 mL Intravenous Q12H  . sodium chloride  3 mL Intravenous Q12H  . sodium chloride  3 mL Intravenous Q12H   Infusions:  . sodium chloride 1 mL/kg/hr (04/18/14 0416)    Labs:  Recent Labs  04/15/14 1700 04/16/14 0535 04/17/14 0430  NA  --  140 139  K  --  3.5 3.4*  CL  --  93* 93*  CO2  --  41* 38*  GLUCOSE  --  140* 143*  BUN  --  9 16  CREATININE 0.72 0.61 0.72  CALCIUM  --  8.7 8.8  MG 1.7  --   --     Recent Labs  04/16/14 0535 04/17/14 0430  AST 26 22  ALT 24 23  ALKPHOS 50 44  BILITOT 0.7 0.4  PROT 5.7* 5.5*  ALBUMIN 3.3* 3.2*    Recent Labs  04/15/14 1417 04/15/14 1423  04/17/14 0430 04/18/14 0554  WBC 17.8* 16.5*  < > 15.8* 16.0*  NEUTROABS 14.9* 13.2*  --   --   --   HGB 10.0* 10.2*  < > 8.8* 8.8*  HCT 31.8* 36.4  < > 31.3* 32.1*  MCV 79.2 85.8  < > 83.9 84.5  PLT 348.0 317  < > 271 293  < > = values in this interval  not displayed.  Recent Labs  04/15/14 1700 04/15/14 2307 04/16/14 0535 04/16/14 1358  TROPONINI <0.03 0.32* 0.57* 0.35*   Invalid input(s): POCBNP No results for input(s): HGBA1C in the last 72 hours.   Radiology/Studies:  Dg Chest 1 View  04/15/2014   CLINICAL DATA:  74 year old female with a history of shortness of breath, hypertension  EXAM: CHEST  1 VIEW  COMPARISON:  11/11/2013  FINDINGS: Cardiomediastinal silhouette unchanged in size and contour with cardiomegaly.  Atherosclerotic calcifications of the aortic arch.  No evidence of pulmonary vascular congestion.  No pneumothorax.  Persistent opacity at the base of the right lung, present on comparison plain film studies.  Retrocardiac region not well evaluated.  No definite confluent airspace disease.  No displaced fracture.  IMPRESSION: No evidence of pulmonary edema, with stable  cardiomegaly and atherosclerotic changes.  Retrocardiac region not well visualized, and if there is were further concern for acute process, a dedicated PA and lateral chest x-ray would be useful.  Persistent opacity at the right base, likely reflective of atelectasis and/ or scarring. If there were a need to further characterize the finding a CT would be required.  Signed,  Dulcy Fanny. Earleen Newport, DO  [facility]  [facility]   Electronically Signed   By: Corrie Mckusick D.O.   On: 04/15/2014 17:09   Dg Chest Port 1 View  04/15/2014   CLINICAL DATA:  Chest pain and difficulty breathing acute onset  EXAM: PORTABLE CHEST - 1 VIEW  COMPARISON:  Study obtained earlier in the day; November 11, 2013  FINDINGS: There is underlying emphysematous change. There is chronic pleural thickening on the right which appears stable. There is no frank edema or consolidation. Heart is enlarged with pulmonary vascularity within normal limits. There is atherosclerotic change in the aorta. No adenopathy. No bone lesions.  IMPRESSION: Stable cardiomegaly. Chronic pleural thickening on the right. Underlying emphysema. No frank edema or consolidation.   Electronically Signed   By: Lowella Grip III M.D.   On: 04/15/2014 14:49     Assessment and Plan   74 y.o. female with HTN, HLD, SVT on amiodarone, AAA (4.3 x 3.8 cm ), COPD on home O2, bladder cancer and cerebrovascular disease admitted 04/15/14 for dyspnea and chest pain. She was admitted by [facility] and treated with one dose of IV lasix and with nebs/steroids for COPD exacerbation. Transient CP that resolved on its own - peak troponin 0.57.   1. Acute on chronic respiratory failure (on 4L/O2 at home), likely multifactorial d/t COPD/CHF - ABG with hypercapnia, likely chronic, d-dimer negative - 2D echo with new low EF (40-45%) and WMA (akinesis of the apicalinferior myocardium, akinesis of themid-apicalanteroseptal myocardium), G1DD, PA  pressure 49 - cath team has added stat K level to this AM's labs but anticipate she'll need scheduled KCl with daily oral Lasix  2. Elevated troponin, question demand ischemia versus NSTEMI with history of CAD - cath 2012 totally occluded LAD with right to left collaterals, a nodular density in the prox CFX that occupied about 70% of the prox vessel, rx'd medically - continue aspirin, statin, Plavix (likely cannot tolerate b-blocker due to severe COPD) - for cath today - risks and benefits of cardiac catheterization have been discussed with the patient. These include bleeding, infection, kidney damage, stroke, heart attack, death. The patient understands these risks and is willing to proceed.  3. Anemia without patient-reported bleeding - Hgb 10 on admission, downtrended to 8.8 and has remained stable (  baseline appears 10-11) - will place order for hemoccult but will defer further work-up per IM  4. Essential HTN 5. H/o SVT/Vtach,on amiodarone 6. AAA, f/u due 07/2014 7. Carotid stenosis, followed by vascular surgery 8. Leukocytosis in setting of steroids  Signed, Melina Copa PA-C  Patient seen and examined. I agree with the assessment and plan as detailed above. See also my additional thoughts below.   The patient is for cath later today. I have reviewed telemetry today April 18, 2014. She continues to hold sinus rhythm on amiodarone.  Dola Argyle, MD, [facility] 04/18/2014 10:49 AM

## 2014-04-18 NOTE — Progress Notes (Signed)
OT Cancellation Note  Patient Details Name: [name] [name] MRN: [id number] DOB: [birth year]   Cancelled Treatment:    Reason Eval/Treat Not Completed: Other (comment). Pt for cardiac cath this afternoon, will wait eval tomorrow.  Almon Register [phone] 04/18/2014, 1:15 PM

## 2014-04-19 ENCOUNTER — Encounter (HOSPITAL_COMMUNITY): Payer: Self-pay | Admitting: Internal Medicine

## 2014-04-19 ENCOUNTER — Inpatient Hospital Stay (HOSPITAL_COMMUNITY): Payer: Medicare Other

## 2014-04-19 DIAGNOSIS — J441 Chronic obstructive pulmonary disease with (acute) exacerbation: Secondary | ICD-10-CM

## 2014-04-19 DIAGNOSIS — J189 Pneumonia, unspecified organism: Secondary | ICD-10-CM

## 2014-04-19 DIAGNOSIS — J9602 Acute respiratory failure with hypercapnia: Secondary | ICD-10-CM

## 2014-04-19 DIAGNOSIS — I5032 Chronic diastolic (congestive) heart failure: Secondary | ICD-10-CM

## 2014-04-19 LAB — BASIC METABOLIC PANEL
ANION GAP: 6 (ref 5–15)
BUN: 15 mg/dL (ref 6–23)
CO2: 45 mmol/L — AB (ref 19–32)
CREATININE: 0.57 mg/dL (ref 0.50–1.10)
Calcium: 8.5 mg/dL (ref 8.4–10.5)
Chloride: 92 mmol/L — ABNORMAL LOW (ref 96–112)
GFR calc Af Amer: >90 mL/min (ref >90)
GFR calc non Af Amer: >90 mL/min (ref >90)
Glucose, Bld: 131 mg/dL — ABNORMAL HIGH (ref 70–99)
Potassium: 3.9 mmol/L (ref 3.5–5.1)
Sodium: 143 mmol/L (ref 135–145)

## 2014-04-19 LAB — CBC
HEMATOCRIT: 31.1 % — AB (ref 36.0–46.0)
HEMOGLOBIN: 8.6 g/dL — AB (ref 12.0–15.0)
MCH: 23.2 pg — ABNORMAL LOW (ref 26.0–34.0)
MCHC: 27.7 g/dL — ABNORMAL LOW (ref 30.0–36.0)
MCV: 84.1 fL (ref 78.0–100.0)
Platelets: 266 10*3/uL (ref 150–400)
RBC: 3.7 MIL/uL — AB (ref 3.87–5.11)
RDW: 17 % — ABNORMAL HIGH (ref 11.5–15.5)
WBC: 12.4 10*3/uL — ABNORMAL HIGH (ref 4.0–10.5)

## 2014-04-19 LAB — BLOOD GAS, ARTERIAL
ACID-BASE EXCESS: 17.2 mmol/L — AB (ref 0.0–2.0)
Acid-Base Excess: 16.7 mmol/L — ABNORMAL HIGH (ref 0.0–2.0)
Acid-Base Excess: 19.1 mmol/L — ABNORMAL HIGH (ref 0.0–2.0)
BICARBONATE: 44 meq/L — AB (ref 20.0–24.0)
Bicarbonate: 44 mEq/L — ABNORMAL HIGH (ref 20.0–24.0)
Bicarbonate: 45.8 mEq/L — ABNORMAL HIGH (ref 20.0–24.0)
DRAWN BY: 295031
Delivery systems: POSITIVE
Drawn by: 249101
Drawn by: 295031
FIO2: 0.5 %
FIO2: 0.4 %
O2 CONTENT: 3 L/min
O2 SAT: 96.1 %
O2 SAT: 90 %
O2 Saturation: 84.1 %
PATIENT TEMPERATURE: 98.6
PCO2 ART: 86 mmHg — AB (ref 35.0–45.0)
PCO2 ART: 95.8 mmHg — AB (ref 35.0–45.0)
PEEP/CPAP: 6 cmH2O
PEEP: 5 cmH2O
PO2 ART: 99.2 mmHg (ref 80.0–100.0)
PRESSURE CONTROL: 10 cmH2O
PRESSURE SUPPORT: 6 cmH2O
Patient temperature: 98.6
Patient temperature: 98.6
RATE: 10 resp/min
RATE: 16 resp/min
TCO2: 46.6 mmol/L (ref 0–100)
TCO2: 46.9 mmol/L (ref 0–100)
TCO2: 48.4 mmol/L (ref 0–100)
pCO2 arterial: 85.7 mmHg (ref 35.0–45.0)
pH, Arterial: 7.329 — ABNORMAL LOW (ref 7.350–7.450)
pH, Arterial: 7.284 — ABNORMAL LOW (ref 7.350–7.450)
pH, Arterial: 7.347 — ABNORMAL LOW (ref 7.350–7.450)
pO2, Arterial: 56.8 mmHg — ABNORMAL LOW (ref 80.0–100.0)
pO2, Arterial: 64.5 mmHg — ABNORMAL LOW (ref 80.0–100.0)

## 2014-04-19 LAB — MRSA PCR SCREENING: MRSA by PCR: NEGATIVE

## 2014-04-19 MED ORDER — POTASSIUM CHLORIDE CRYS ER 20 MEQ PO TBCR
20.0000 meq | EXTENDED_RELEASE_TABLET | Freq: Every day | ORAL | Status: DC
  Administered 2014-04-20 – 2014-04-23 (×4): 20 meq via ORAL
  Filled 2014-04-19 (×6): qty 1

## 2014-04-19 MED ORDER — BUDESONIDE 0.25 MG/2ML IN SUSP
0.5000 mg | Freq: Two times a day (BID) | RESPIRATORY_TRACT | Status: DC
  Administered 2014-04-19 – 2014-04-22 (×5): 0.5 mg via RESPIRATORY_TRACT
  Filled 2014-04-19 (×8): qty 4

## 2014-04-19 MED ORDER — SODIUM CHLORIDE 0.9 % IV SOLN: INTRAVENOUS | Status: DC

## 2014-04-19 MED ORDER — FUROSEMIDE 10 MG/ML IJ SOLN
40.0000 mg | Freq: Once | INTRAMUSCULAR | Status: AC
  Administered 2014-04-19: 40 mg via INTRAVENOUS
  Filled 2014-04-19: qty 4

## 2014-04-19 MED ORDER — ARFORMOTEROL TARTRATE 15 MCG/2ML IN NEBU
15.0000 ug | INHALATION_SOLUTION | Freq: Two times a day (BID) | RESPIRATORY_TRACT | Status: DC
  Administered 2014-04-20 – 2014-04-22 (×5): 15 ug via RESPIRATORY_TRACT
  Filled 2014-04-19 (×12): qty 2

## 2014-04-19 MED ORDER — METHYLPREDNISOLONE SODIUM SUCC 125 MG IJ SOLR
60.0000 mg | Freq: Four times a day (QID) | INTRAMUSCULAR | Status: DC
  Administered 2014-04-19: 60 mg via INTRAVENOUS
  Filled 2014-04-19: qty 2

## 2014-04-19 MED ORDER — METHYLPREDNISOLONE SODIUM SUCC 125 MG IJ SOLR
60.0000 mg | Freq: Three times a day (TID) | INTRAMUSCULAR | Status: DC
  Administered 2014-04-19 – 2014-04-22 (×8): 60 mg via INTRAVENOUS
  Filled 2014-04-19 (×9): qty 0.96
  Filled 2014-04-19: qty 2

## 2014-04-19 NOTE — Progress Notes (Signed)
In to see patient at this time, patient is status post Bi-pap removal at 1330 and has been fine since then, with sats ranging from 97-98% on veni mask at 40%. Engaging and conversing with husband at beside and myself.  At the current time, patient has become agitated, restless, and appears air hungry sitting up in the bed. Patient placed back on Bi-pap, [facility] notified at this time, and PA Cards updated at bedside at this time. ABG to be draw, 12 lead ekg, and solu-medrol IVP given at this time. Another IV access placed at this time, RT notified. Will continue to monitor the patient closely.

## 2014-04-19 NOTE — Progress Notes (Signed)
Rapid response notified, respiratory therapy called to start bipap.  Pt in no acute distress.  Pt sitting up and talking, A&O.  Pt made NPO and breakfast tray removed from room.  Will continue to monitor. [name] [name]

## 2014-04-19 NOTE — Significant Event (Signed)
Rapid Response Event Note  Overview:  Called to assist with patient needing Bipap for hypercarbia Time Called: 0933 Arrival Time: 0941 Event Type: Respiratory  Initial Focused Assessment: On arrival patient awake and alert - pale - w/d - oriented and appropriate - bil BS very distant BS - no wheezing or rales appreciated - RR and unlabored rate 16 - states she does not feel SOB - some peripheral edema - no JVD noted - O2 sats 96% on 5 liters - patient states she believes her oxygen was off for a little while this AM.  She wears 4-4.5 liters at home.  In NAD.     Interventions:  Encouraged patient to initiate Bipap - she is a little anxious but relaxed.  Agreed to Bipap at low setting.  Initiated by Maudie Mercury RT at 1020 am - 5/5 with 35% - tolerates well with coaching.  Resting at intervals.  HR remain 85 RR18-20 O2 sats 98%.  Awaiting bed on SDU.  Continue to monitor.  RT remains present.  Tol Bipap for the most part - some coaching to keep mask on.  127/64  HR 85 RR 18  O2 sats 98%.  Transfer to [place] with Bipap and monitor - no problems.  Handoff to [employer].     Event Summary: Name of Physician Notified: Dr. Allyson Sabal at  (pta rrt)    at    Outcome: Transferred (Comment) (2h24)     Quin Hoop

## 2014-04-19 NOTE — Progress Notes (Signed)
OT Cancellation Note  Patient Details Name: MACEY WURTZ MRN: [id number] DOB: [birth year]   Cancelled Treatment:    Reason Eval/Treat Not Completed: Medical issues which prohibited therapy (currently rapid response awaiting transfer, will hold)  Genoveva Singleton A 04/19/2014, 10:14 AM

## 2014-04-19 NOTE — Progress Notes (Signed)
RT note- patient transported to 2H-24 on Bipap, remains on current settings.

## 2014-04-19 NOTE — Progress Notes (Signed)
PT Cancellation Note  Patient Details Name: [name] [name] MRN: [id number] DOB: [birth year]   Cancelled Treatment:    Reason Eval/Treat Not Completed: Medical issues which prohibited therapy (currently rapid response awaiting transfer, will hold)   Duncan Dull 04/19/2014, 10:05 AM Alben Deeds, PT DPT  [phone]

## 2014-04-19 NOTE — Progress Notes (Signed)
Pt transported to [place] in NAD with rapid response nurse and RT on Bipap.  Report given to Josph Macho, Therapist, sports. Graceann Congress

## 2014-04-19 NOTE — Progress Notes (Signed)
Per d/w Dr. Stanford Breed, defer plan for cath for now. Will reassess for stability in AM. Pre-cath orders discontinued. Clorene Nerio PA-C

## 2014-04-19 NOTE — Consult Note (Signed)
PULMONARY / CRITICAL CARE MEDICINE   Name: [name] [name] MRN: [id number] DOB: 12/17/1940    ADMISSION DATE:  04/15/2014 CONSULTATION DATE:  04/19/2014  REFERRING MD :  Allyson Sabal  CHIEF COMPLAINT:  SOB  INITIAL PRESENTATION: 74 year old female with end stage COPD (MW patient) and extensive cardiovascular history presented from cardiology office 2/19 with dyspnea and chest pain.  EKG showed new RBBB. She was admitted with COPD exacerbation and chest pain. Cath lab 2/22 showed disease to LAD and LCX. Plan was to go to cath lab 2/24 for PCI, however on 2/23 she had respiratory distress requiring BiPAP. PCCM consulted.  STUDIES:  PFTs 12/14/2012 FEV1 0.49 (21%) improves by 31%  2/20 Echo > new low EF (40-45%) and akinesis of the apicalinferior myocardium, akinesis of themid-apicalanteroseptal myocardium, Grade 1 DD, PA pressure 49  SIGNIFICANT EVENTS: 2/19 admitted for CP and AECOPD 2/22 cath > old LAD occlusion and new LCX stenosis 2/23 respiratory distress, started BiPAP, to SDU.   HISTORY OF PRESENT ILLNESS:  74 year old female with PMH as below which includes COPD (MW patient on 4L home O2), CAD (known LAD occlusion with collaterals), SVT, AAA, CHF, and A-fib. She presented to cardiology office 2/19 complaining of profound dyspnea on exertion. At that time it was recommended that she come to ED, however she did not want to do this, so some medicatin changes were made and she was referred for outpatient CXR. On her way to the radiology center she developed acute onset chest pain and EMS was called in ED she was dyspneic and had transient chest pressure. New RBBB and TWI on EKG but no troponin elevation. She was admitted to hospitalists and treated for COPD exacerbation. Cardiology was consulted the following day after a small rise in serum troponin. They recommended cardiac cath, however, she was opposed initially. 2/21 she changed her mind and decided to undergo cath. 2/22 she was found to have  complete occlusion of LAD (which was known) and a new LCX stenosis. At that time she had no complaints of CP or SOB. The plan was to go back to cath lab 2/24 for high risk PCI, however, 2/23 she developed acute onset SOB. She was transferred to SDU and started on BiPAP. ABG consistently indicated worsening respiratory acidosis. PCCM asked to see.   PAST MEDICAL HISTORY :   has a past medical history of Hypertension; Hyperlipidemia; Cerebrovascular disease, unspecified; Unspecified glaucoma; Obesity, unspecified; Coronary artery disease; Ventricular tachycardia; SVT (supraventricular tachycardia); AAA (abdominal aortic aneurysm); COPD (chronic obstructive pulmonary disease) (2013); Shortness of breath; Complication of anesthesia; Carotid artery occlusion; Anemia; CHF (congestive heart failure); Abdominal aneurysm; Pneumonia (X ~ 2); On home oxygen therapy; GERD (gastroesophageal reflux disease); Migraines; Arthritis; Bladder cancer (2009); Atrial fibrillation; and Bell palsy (2014).  has past surgical history that includes Cystoscopy; Bladder tumor excision; Cystoscopy; Wedge resection (Right, 1980's); Cystoscopy with biopsy (12/24/2011); Tubal ligation (Bilateral, 1980's); Cataract extraction w/ intraocular lens  implant, bilateral (Bilateral, ~ 2011); Cardiac catheterization ("several"); and left heart catheterization with coronary angiogram (N/A, 04/18/2014). Prior to Admission medications   Medication Sig Start Date End Date Taking? Authorizing Provider  acetaminophen-codeine (TYLENOL #3) 300-30 MG per tablet Take 1 tablet by mouth every 8 (eight) hours as needed for moderate pain.    Yes Historical Provider, MD  amiodarone (PACERONE) 200 MG tablet Take 200 mg by mouth at bedtime.  09/20/11  Yes Lelon Perla, MD  aspirin EC 81 MG tablet Take 81 mg by mouth  at bedtime.   Yes Historical Provider, MD  budesonide (PULMICORT) 0.25 MG/2ML nebulizer solution 2 ml twice daily with perforomist 10/29/13  Yes  Tanda Rockers, MD  clonazePAM (KLONOPIN) 0.5 MG tablet Take 0.25-0.5 mg by mouth every 6 (six) hours as needed for anxiety.    Yes Historical Provider, MD  clopidogrel (PLAVIX) 75 MG tablet Take 1 tablet (75 mg total) by mouth at bedtime. 12/26/11  Yes Festus Aloe, MD  Dextromethorphan-Guaifenesin ([place] FAST-MAX DM MAX) 5-100 MG/5ML LIQD Take 5 mLs by mouth every 12 (twelve) hours as needed (for congestion).    Yes Historical Provider, MD  diltiazem (CARDIZEM CD) 300 MG 24 hr capsule TAKE 1 CAPSULE IN THE EVENING. 02/07/14  Yes Lelon Perla, MD  furosemide (LASIX) 20 MG tablet Take 2 tablets (40 mg total) by mouth daily. 11/12/13  Yes Lelon Perla, MD  ipratropium (ATROVENT) 0.02 % nebulizer solution Take 0.5 mg by nebulization 4 (four) times daily.    Yes Historical Provider, MD  ipratropium (ATROVENT) 0.02 % nebulizer solution USE 1 VIAL IN NEBULIZER EVERY 6 HOURS. 03/14/14  Yes Tanda Rockers, MD  levalbuterol [facility] HFA) 45 MCG/ACT inhaler Inhale 2 puffs into the lungs every 4 (four) hours as needed for wheezing. 09/07/13  Yes Tammy S Parrett, NP  nitroGLYCERIN (NITROSTAT) 0.4 MG SL tablet Place 0.4 mg under the tongue every 5 (five) minutes as needed for chest pain (may repeat x3). Chest pain 07/10/10  Yes Lelon Perla, MD  nystatin (MYCOSTATIN) 100000 UNIT/ML suspension TAKE 1 TEASPOONFUL FOUR TIMES DAILY AS DIRECTED. 04/04/14  Yes Historical Provider, MD  pantoprazole (PROTONIX) 40 MG tablet Take 40 mg by mouth daily.   Yes Historical Provider, MD  pantoprazole (PROTONIX) 40 MG tablet TAKE 1 TABLET BY MOUTH ONCE DAILY 30-60 MINTUES BEFORE FIRST MEAL OF THE DAY. 03/07/14  Yes Tanda Rockers, MD  potassium chloride SA (K-DUR,KLOR-CON) 20 MEQ tablet Take 40 mEq by mouth daily.    Yes Historical Provider, MD  pravastatin (PRAVACHOL) 80 MG tablet Take 80 mg by mouth at bedtime.   Yes Historical Provider, MD  predniSONE (DELTASONE) 20 MG tablet Take 10 mg by mouth daily with  breakfast.   Yes Historical Provider, MD  promethazine (PHENERGAN) 25 MG tablet Take 25 mg by mouth every 4 (four) hours as needed for nausea.    Yes Historical Provider, MD  ranitidine (ZANTAC) 150 MG capsule Take 150 mg by mouth at bedtime.   Yes Historical Provider, MD  Travoprost, BAK Free, (TRAVATAN) 0.004 % SOLN ophthalmic solution Place 1 drop into both eyes at bedtime.   Yes Historical Provider, MD  azithromycin (ZITHROMAX) 500 MG tablet Take 1 tablet (500 mg total) by mouth daily. 04/17/14   Reyne Dumas, MD  furosemide (LASIX) 40 MG tablet Take 1 tablet (40 mg total) by mouth daily. 04/17/14   Reyne Dumas, MD  guaiFENesin-dextromethorphan (ROBITUSSIN DM) 100-10 MG/5ML syrup Take 5 mLs by mouth every 4 (four) hours as needed for cough. 04/17/14   Reyne Dumas, MD  predniSONE (DELTASONE) 1 MG tablet 6 tablets for 3 days 5 tablets for 3 days 4 tablets for 3 days 3 tablets for 3 days 2 tablets for 3 days 1 tablet for 3 days then discontinue 04/17/14   Reyne Dumas, MD   Allergies  Allergen Reactions  . Albuterol Other (See Comments)    Jittery/shakiness  . Levaquin [Levofloxacin In D5w] Nausea Only  . Sulfa Antibiotics Nausea Only    FAMILY HISTORY:  indicated that her mother is deceased. She indicated that her father is deceased.  SOCIAL HISTORY:  reports that she quit smoking about 2 years ago. Her smoking use included Cigarettes. She has a 100 pack-year smoking history. She has never used smokeless tobacco. She reports that she does not drink alcohol or use illicit drugs.  REVIEW OF SYSTEMS:  Limited due to SOB/lethargy/BiPAP Bolds are positive  Constitutional: weight loss, gain, night sweats, Fevers, chills, fatigue .  HEENT: headaches, Sore throat, sneezing, nasal congestion, post nasal drip, Difficulty swallowing, Tooth/dental problems, visual complaints visual changes, ear ache CV:  chest pain, radiates: ,Orthopnea, PND, swelling in lower extremities, dizziness, palpitations,  syncope.  GI  heartburn, indigestion, abdominal pain, nausea, vomiting, diarrhea, change in bowel habits, loss of appetite, bloody stools.  Resp: cough, productive: , hemoptysis, dyspnea, chest pain, pleuritic.  Skin: rash or itching or icterus GU: dysuria, change in color of urine, urgency or frequency. flank pain, hematuria  MS: joint pain or swelling. decreased range of motion  Psych: change in mood or affect. depression or anxiety.  Neuro: difficulty with speech, weakness, numbness, ataxia    SUBJECTIVE:   VITAL SIGNS: Temp:  [97.4 F (36.3 C)-98.1 F (36.7 C)] 97.4 F (36.3 C) (02/23 0559) Pulse Rate:  [70-93] 93 (02/23 1400) Resp:  [14-24] 24 (02/23 1400) BP: (109-149)/(46-74) 149/66 mmHg (02/23 1400) SpO2:  [87 %-99 %] 94 % (02/23 1444) FiO2 (%):  [35 %-40 %] 40 % (02/23 1444) Weight:  [81.2 kg (179 lb 0.2 oz)] 81.2 kg (179 lb 0.2 oz) (02/23 0559) HEMODYNAMICS:   VENTILATOR SETTINGS: Vent Mode:  [-] BIPAP FiO2 (%):  [35 %-40 %] 40 % Set Rate:  [10 bmp] 10 bmp PEEP:  [5 cmH20] 5 cmH20 INTAKE / OUTPUT:  Intake/Output Summary (Last 24 hours) at 04/19/14 1615 Last data filed at 04/19/14 1350  Gross per 24 hour  Intake    560 ml  Output    900 ml  Net   -340 ml    PHYSICAL EXAMINATION: General:  Obese female on BiPAP in mild distress Neuro:  Alert, oriented x 3, non-focal HEENT:  [place]/AT, no JVD noted, PERRL Cardiovascular:  RRR, no MRG Lungs:  Poor air movement, no wheeze Abdomen:  Obese, soft, non-tender, non-distended Musculoskeletal:  No acute deformity Skin:  Grossly intact  LABS:  CBC  Recent Labs Lab 04/18/14 0554 04/18/14 2012 04/19/14 0505  WBC 16.0* 13.8* 12.4*  HGB 8.8* 9.3* 8.6*  HCT 32.1* 33.9* 31.1*  PLT 293 317 266   Coag's  Recent Labs Lab 04/18/14 0554  INR 0.95   BMET  Recent Labs Lab 04/16/14 0535 04/17/14 0430 04/18/14 0932 04/18/14 2012 04/19/14 0505  NA 140 139  --   --  143  K 3.5 3.4* 3.5  --  3.9  CL 93* 93*   --   --  92*  CO2 41* 38*  --   --  45*  BUN 9 16  --   --  15  CREATININE 0.61 0.72  --  0.61 0.57  GLUCOSE 140* 143*  --   --  131*   Electrolytes  Recent Labs Lab 04/15/14 1700 04/16/14 0535 04/17/14 0430 04/19/14 0505  CALCIUM  --  8.7 8.8 8.5  MG 1.7  --   --   --    Sepsis Markers No results for input(s): LATICACIDVEN, PROCALCITON, O2SATVEN in the last 168 hours. ABG  Recent Labs Lab 04/17/14 2108 04/19/14 0910 04/19/14 1550  PHART 7.353  7.329* 7.284*  PCO2ART 74.3* 86.0* 95.8*  PO2ART 102.0* 56.8* 99.2   Liver Enzymes  Recent Labs Lab 04/15/14 1700 04/16/14 0535 04/17/14 0430  AST 24 26 22   ALT 25 24 23   ALKPHOS 53 50 44  BILITOT 0.7 0.7 0.4  ALBUMIN 3.7 3.3* 3.2*   Cardiac Enzymes  Recent Labs Lab 04/15/14 1417  04/15/14 2307 04/16/14 0535 04/16/14 1358  TROPONINI  --   < > 0.32* 0.57* 0.35*  PROBNP 230.0*  --   --   --   --   < > = values in this interval not displayed. Glucose No results for input(s): GLUCAP in the last 168 hours.  Imaging No results found.   ASSESSMENT / PLAN:  Acute on chronic hypercarbic/hypoxemic respiratory failure 2nd to severe COPD - BiPAP continuous  - Titrate FiO2 to keep SpO2 90-95% - Scheduled Brovana - Increase budesonide - PRN xopenex - Follow CXR, ABG - Solumedrol to TID - Still desires full code and would endorse intubation if necessary   CAP - Abx: ceftriaxone, start date 2/19 > - Abx: azithro, start date 2/19 > - Follow WBC and fever curve  Acute on chronic systolic/diastolic CHF NSTEMI- known LAD 100%, new LCX stenosis HTN PAF - currently sinus - Management per cardiology - If not urgent, would defer elective cardiac cath at this time, as sedation would be difficult in current state.   Acute metabolic encephalopathy 2nd to hypercarbia - RASS goal: 0 - Monitor  Georgann Housekeeper, AGACNP-BC [place] [facility] Pager [phone] or [phone]    Attending:  I have  seen and examined the patient with nurse practitioner/resident and agree with the note above.    Impression: Active Problems:   Essential hypertension   Coronary atherosclerosis   Acute-on-chronic respiratory failure   Acute exacerbation of chronic obstructive pulmonary disease (COPD)   Anxiety disorder   Chronic diastolic congestive heart failure   COPD (chronic obstructive pulmonary disease)   Elevated troponin   Chest pain   Congestive heart disease   Acute respiratory failure with hypercapnia   CAP (community acquired pneumonia)    Chart reviewed, I independently took a history.  She denies cough.  Her lung exam is notable for very limited air movement, faint wheezing.    Her primary problem here is hypercarbic respiratory failure from severe, end stage pulmonary disease (COPD) complicated by community acquired pneumonia.  I do not feel that performing an elective left heart catheterization right now is a good idea unless her respiratory status improves or if the cardiology team feels that it is absolutely essential.  Agree with current COPD exacerbation management, we will add brovana.  Also agree with diuresis for acute systolic heart failure.  Continue BIPAP.  I discussed code status with her husband.  I don't recommend intubation but they prefer her to be full code.  I explained that if we went that route, we should limit time on the vent to 3-4 days max.  He is agreeable to this plan.  My CC time 80 minutes  Roselie Awkward, MD [place] [facility] Pager: [phone] Cell: [phone] If no response, call [phone]

## 2014-04-19 NOTE — Progress Notes (Addendum)
[facility] PROGRESS NOTE  [name] CASSTEVENS WER:[id number] DOB: November 29, 1940 DOA: 04/15/2014 PCP: Sherrie Mustache, MD  Assessment/Plan: Active Problems:   Essential hypertension   Coronary atherosclerosis   Acute-on-chronic respiratory failure   Acute exacerbation of chronic obstructive pulmonary disease (COPD)   Anxiety disorder   Chronic diastolic congestive heart failure   COPD (chronic obstructive pulmonary disease)   Elevated troponin   Chest pain   Congestive heart disease   Acute on chronic hypoxic /hypercapnic  respiratory failure (on 4L/O2 at home), likely multifactorial d/t COPD/CHF Multifactorial , CHF/COPD exacerbation Transfer patient to step down because of hypercapnic respiratory failure which is worsening and place the patient on BiPAP Continue medications for acute on chronic diastolic congestive heart failure and COPD exacerbation Repeat another portable chest x-ray today D-dimer negative,, troponin abnl Continue nebulizer treatments, continue Lasix will switch to by mouth Continue Solu-Medrol 40 mg IV every 12 2-D echo  Findings as below Cont abx ,improving  Patient has been made nothing by mouth and will be placed on BiPAP, ABG showed a pH of 7.32, PCO2 of 86  Essential hypertension-continue diltiazem, by mouth Lasix  Anemia without patient-reported bleeding Hemoglobin 8.6, continue to follow    Acute-on-chronic respiratory failure , oxygen dependent at home on 4 L ABG shows hypercapnia ,  start the patient on BiPAP Hypercapnia exacerbated by klonopin and tylenol with codeine , avoid these medications  Peripheral vascular disease known AAA, f/u duplex was due 07/2014 but cath shows "large complex AAA with severe distal aortic disease and probable high-grade ostial disease in bilateral iliacs" Patient will need outpatient vascular follow-up X   History of CVA Continue aspirin and Plavix   Coronary artery disease Continue with aspirin and  Plavix, and cycle cardiac enzymes Troponin trending up S/p cath 2/22  cath 04/18/14: severe 2V calcific CAD with totally occluded LAD and high grade prox disease in dominant LCx that collateralizes her LAD - felt to be poor candidate for CABG; plan high risk PCI of LCx with possible rotational atherectomy tomorrow - received IV hydration post cath along with IV Lasix. Will give lower dose hydration tomorrow in prep for 8:30am procedure - continue aspirin, statin, Plavix (likely cannot tolerate b-blocker due to severe COPD).  Will need VVS f/u for PAD.  -- Troponin elevated --> 0.32--> 0.57--> 0.35 . ECG with new TWI laterally. Chronic RBBB. -- 2D ECHO with new low EF and WMA    Gastroesophageal reflux Continue with Protonix  Atrial fibrillation continue with amiodarone  Anxiety disorder-continue with Klonopin, minimal dose if needed     Code Status: full Family Communication: patient's husband is by the bedside Disposition Plan: Transferred to step down, cardiac cath tomorrow with PCI   Brief narrative: HPI:  74 yr old with CAD, hypertension, hyperlipidemia, SVT, AAA, COPD, bladder cancer and cerebrovascular disease sent from cardiology office for dyspnea that has worsened over the past 1 week. She has severe dyspnea with any activity and some dyspnea at rest. No fevers, chills or productive cough. No hemoptysis. She denies orthopnea or PND but she has had increased weight. She saw Dr. Stanford Breed her cardiologist today who increased her Lasix and recommended admission for acute on chronic heart failure however patient refused. When she was outside the medical building she did develop some chest pain and increased shortness of breath and EMS was called. She was brought here to the emergency room. Upon arrival pt was pale with sats of 91% on 4 L, tachypneic in 30's with  pressure of 90 sbp. EKG showed RBB. EMS gave 5 mg albuterol . She had about a 20-30 minutes some tightness in the  center of her chest. She denies any chest pain currently. She still feels short of breath. She was given one nebulizer treatment around and feels a little bit better. She's on baseline oxygen at 4 L/m. She has had some increased cough but no change in color of her sputum. She denies any known fevers. She denies any nausea or vomiting ER workup : chest x-ray does not show edema. Her BNP is mildly elevated. There is no evidence of pneumonia. She did have some chest pain but her troponin is negative. Her EKG shows a right bundle branch block which is new from her past EKG but no ST elevation is noted. She's been given another nebulizer treatment here in the ED as well as Solu-Medrol. She's feeling better ,admitted for CHF ,COPD exacerbation.   Consultants:  Cardiology  Procedures:  None  Antibiotics: None  HPI/Subjective: Patient is somnolent today arousable but very groggy, no chest pain overnight  Objective: Filed Vitals:   04/18/14 2132 04/19/14 0210 04/19/14 0550 04/19/14 0559  BP: 113/48 109/46  117/50  Pulse: 85 77  90  Temp: 97.8 F (36.6 C)   97.4 F (36.3 C)  TempSrc: Oral   Oral  Resp: 17 18  17   Height:      Weight:    81.2 kg (179 lb 0.2 oz)  SpO2: 93% 99% 87% 94%    Intake/Output Summary (Last 24 hours) at 04/19/14 1053 Last data filed at 04/19/14 0601  Gross per 24 hour  Intake 1366.28 ml  Output   2200 ml  Net -833.72 ml    Exam:  General: No acute respiratory distress, obtunded Lungs: Clear to auscultation bilaterally without wheezes or crackles Cardiovascular: Regular rate and rhythm without murmur gallop or rub normal S1 and S2 Abdomen: Nontender, nondistended, soft, bowel sounds positive, no rebound, no ascites, no appreciable mass Extremities: No significant cyanosis, clubbing, or edema bilateral lower extremities      Data Reviewed: Basic Metabolic Panel:  Recent Labs Lab 04/15/14 1417 04/15/14 1423 04/15/14 1700 04/16/14 0535  04/17/14 0430 04/18/14 0932 04/18/14 2012 04/19/14 0505  NA 141 138  --  140 139  --   --  143  K 3.5 3.5  --  3.5 3.4* 3.5  --  3.9  CL 94* 92*  --  93* 93*  --   --  92*  CO2 38* 37*  --  41* 38*  --   --  45*  GLUCOSE 103* 118*  --  140* 143*  --   --  131*  BUN 12 9  --  9 16  --   --  15  CREATININE 0.65 0.69 0.72 0.61 0.72  --  0.61 0.57  CALCIUM 9.3 9.3  --  8.7 8.8  --   --  8.5  MG  --   --  1.7  --   --   --   --   --     Liver Function Tests:  Recent Labs Lab 04/15/14 1417 04/15/14 1700 04/16/14 0535 04/17/14 0430  AST 19 24 26 22   ALT 22 25 24 23   ALKPHOS 55 53 50 44  BILITOT 0.5 0.7 0.7 0.4  PROT 6.6 6.0 5.7* 5.5*  ALBUMIN 3.9 3.7 3.3* 3.2*   No results for input(s): LIPASE, AMYLASE in the last 168 hours. No results for input(s):  AMMONIA in the last 168 hours.  CBC:  Recent Labs Lab 04/15/14 1417 04/15/14 1423  04/16/14 0535 04/17/14 0430 04/18/14 0554 04/18/14 2012 04/19/14 0505  WBC 17.8* 16.5*  < > 9.8 15.8* 16.0* 13.8* 12.4*  NEUTROABS 14.9* 13.2*  --   --   --   --   --   --   HGB 10.0* 10.2*  < > 8.8* 8.8* 8.8* 9.3* 8.6*  HCT 31.8* 36.4  < > 31.6* 31.3* 32.1* 33.9* 31.1*  MCV 79.2 85.8  < > 83.6 83.9 84.5 85.4 84.1  PLT 348.0 317  < > 281 271 293 317 266  < > = values in this interval not displayed.  Cardiac Enzymes:  Recent Labs Lab 04/15/14 1700 04/15/14 2307 04/16/14 0535 04/16/14 1358  TROPONINI <0.03 0.32* 0.57* 0.35*   BNP (last 3 results)  Recent Labs  04/15/14 1423  BNP 229.9*    ProBNP (last 3 results)  Recent Labs  11/11/13 1424 12/29/13 1238 04/15/14 1417  PROBNP 671.3* 195.0* 230.0*      CBG: No results for input(s): GLUCAP in the last 168 hours.  No results found for this or any previous visit (from the past 240 hour(s)).   Studies: Dg Chest 1 View  04/15/2014   CLINICAL DATA:  74 year old female with a history of shortness of breath, hypertension  EXAM: CHEST  1 VIEW  COMPARISON:  11/11/2013   FINDINGS: Cardiomediastinal silhouette unchanged in size and contour with cardiomegaly.  Atherosclerotic calcifications of the aortic arch.  No evidence of pulmonary vascular congestion.  No pneumothorax.  Persistent opacity at the base of the right lung, present on comparison plain film studies.  Retrocardiac region not well evaluated.  No definite confluent airspace disease.  No displaced fracture.  IMPRESSION: No evidence of pulmonary edema, with stable cardiomegaly and atherosclerotic changes.  Retrocardiac region not well visualized, and if there is were further concern for acute process, a dedicated PA and lateral chest x-ray would be useful.  Persistent opacity at the right base, likely reflective of atelectasis and/ or scarring. If there were a need to further characterize the finding a CT would be required.  Signed,  Dulcy Fanny. Earleen Newport, DO  [facility]  [facility]   Electronically Signed   By: Corrie Mckusick D.O.   On: 04/15/2014 17:09   Dg Chest Port 1 View  04/15/2014   CLINICAL DATA:  Chest pain and difficulty breathing acute onset  EXAM: PORTABLE CHEST - 1 VIEW  COMPARISON:  Study obtained earlier in the day; November 11, 2013  FINDINGS: There is underlying emphysematous change. There is chronic pleural thickening on the right which appears stable. There is no frank edema or consolidation. Heart is enlarged with pulmonary vascularity within normal limits. There is atherosclerotic change in the aorta. No adenopathy. No bone lesions.  IMPRESSION: Stable cardiomegaly. Chronic pleural thickening on the right. Underlying emphysema. No frank edema or consolidation.   Electronically Signed   By: Lowella Grip III M.D.   On: 04/15/2014 14:49    Scheduled Meds: . amiodarone  200 mg Oral QHS  . aspirin EC  81 mg Oral Daily  . azithromycin  500 mg Intravenous Q24H  . budesonide  0.25 mg Nebulization BID  . cefTRIAXone (ROCEPHIN)  IV  1 g Intravenous Q24H   . clopidogrel  75 mg Oral QHS  . diltiazem  300 mg Oral Daily  . enoxaparin (LOVENOX) injection  40 mg Subcutaneous Q24H  .  flumazenil  0.2 mg Intravenous Once  . furosemide  40 mg Oral Daily  . ipratropium  0.5 mg Nebulization TID  . latanoprost  1 drop Both Eyes QHS  . methylPREDNISolone (SOLU-MEDROL) injection  40 mg Intravenous Q12H  . nystatin  5 mL Mouth/Throat QID  . pantoprazole  40 mg Oral Daily  . potassium chloride  20 mEq Oral Daily  . pravastatin  80 mg Oral QHS  . sodium chloride  3 mL Intravenous Q12H  . sodium chloride  3 mL Intravenous Q12H   Continuous Infusions: . [START ON 04/20/2014] sodium chloride      Active Problems:   Essential hypertension   Coronary atherosclerosis   Acute-on-chronic respiratory failure   Acute exacerbation of chronic obstructive pulmonary disease (COPD)   Anxiety disorder   Chronic diastolic congestive heart failure   COPD (chronic obstructive pulmonary disease)   Elevated troponin   Chest pain   Congestive heart disease    Time spent: 40 minutes   [facility] Pager [phone]. If 7PM-7AM, please contact night-coverage at [email or web address], password [facility] 04/19/2014, 10:53 AM  LOS: 4 days

## 2014-04-19 NOTE — Progress Notes (Signed)
RT note-Bipap placed post ABG results, patient very apprehensive about wearing, anxious. She is resting well at this time, waiting for transfer bed.

## 2014-04-19 NOTE — Progress Notes (Signed)
Patient: [name] [name] / Admit Date: 04/15/2014 / Date of Encounter: 04/19/2014, 8:51 AM   Subjective: Denies CP or SOB. Expresses discontent for the interruptions that occur in the hospital.  Objective: Telemetry: NSR Physical Exam: Blood pressure 117/50, pulse 90, temperature 97.4 F (36.3 C), temperature source Oral, resp. rate 17, height 5\' 5"  (1.651 m), weight 179 lb 0.2 oz (81.2 kg), SpO2 94 %.  General: Overweight chronically ill appearing WF in no acute distress. Lying flat in bed on her side. Head: Normocephalic, atraumatic, sclera non-icteric, no xanthomas, nares are without discharge. Neck: JVP not elevated. Lungs: Significantly diminished BS throughout without wheezes, rales, or rhonchi. Breathing is unlabored. Heart: RRR S1 S2 without murmurs, rubs, or gallops.  Abdomen: Soft, non-tender, non-distended with normoactive bowel sounds. No rebound/guarding. Extremities: No clubbing or cyanosis. No edema. Distal pedal pulses are 2+ and equal bilaterally. R radial site with ecchymosis but no hematoma; good pulse. Right groin cath site without hematoma, ecchymosis or bruit. Neuro: Alert and oriented X 3. Moves all extremities spontaneously. Psych: Responds to questions appropriately with a normal affect.   Intake/Output Summary (Last 24 hours) at 04/19/14 0851 Last data filed at 04/19/14 0601  Gross per 24 hour  Intake 1486.28 ml  Output   2350 ml  Net -863.72 ml    Inpatient Medications:  . amiodarone  200 mg Oral QHS  . aspirin EC  81 mg Oral Daily  . azithromycin  500 mg Intravenous Q24H  . budesonide  0.25 mg Nebulization BID  . cefTRIAXone (ROCEPHIN)  IV  1 g Intravenous Q24H  . clopidogrel  75 mg Oral QHS  . diltiazem  300 mg Oral Daily  . enoxaparin (LOVENOX) injection  40 mg Subcutaneous Q24H  . flumazenil  0.2 mg Intravenous Once  . furosemide  40 mg Oral Daily  . ipratropium  0.5 mg Nebulization TID  . latanoprost  1 drop Both Eyes QHS  . methylPREDNISolone  (SOLU-MEDROL) injection  40 mg Intravenous Q12H  . nystatin  5 mL Mouth/Throat QID  . pantoprazole  40 mg Oral Daily  . pravastatin  80 mg Oral QHS  . sodium chloride  3 mL Intravenous Q12H  . sodium chloride  3 mL Intravenous Q12H   Infusions:    Labs:  Recent Labs  04/17/14 0430 04/18/14 0932 04/18/14 2012 04/19/14 0505  NA 139  --   --  143  K 3.4* 3.5  --  3.9  CL 93*  --   --  92*  CO2 38*  --   --  45*  GLUCOSE 143*  --   --  131*  BUN 16  --   --  15  CREATININE 0.72  --  0.61 0.57  CALCIUM 8.8  --   --  8.5    Recent Labs  04/17/14 0430  AST 22  ALT 23  ALKPHOS 44  BILITOT 0.4  PROT 5.5*  ALBUMIN 3.2*    Recent Labs  04/18/14 2012 04/19/14 0505  WBC 13.8* 12.4*  HGB 9.3* 8.6*  HCT 33.9* 31.1*  MCV 85.4 84.1  PLT 317 266    Recent Labs  04/16/14 1358  TROPONINI 0.35*   Invalid input(s): POCBNP No results for input(s): HGBA1C in the last 72 hours.   Radiology/Studies:  Dg Chest 1 View  04/15/2014   CLINICAL DATA:  74 year old female with a history of shortness of breath, hypertension  EXAM: CHEST  1 VIEW  COMPARISON:  11/11/2013  FINDINGS: Cardiomediastinal silhouette  unchanged in size and contour with cardiomegaly.  Atherosclerotic calcifications of the aortic arch.  No evidence of pulmonary vascular congestion.  No pneumothorax.  Persistent opacity at the base of the right lung, present on comparison plain film studies.  Retrocardiac region not well evaluated.  No definite confluent airspace disease.  No displaced fracture.  IMPRESSION: No evidence of pulmonary edema, with stable cardiomegaly and atherosclerotic changes.  Retrocardiac region not well visualized, and if there is were further concern for acute process, a dedicated PA and lateral chest x-ray would be useful.  Persistent opacity at the right base, likely reflective of atelectasis and/ or scarring. If there were a need to further characterize the finding a CT would be required.  Signed,   Dulcy Fanny. Earleen Newport, DO  [facility]  [facility]   Electronically Signed   By: Corrie Mckusick D.O.   On: 04/15/2014 17:09   Dg Chest Port 1 View  04/15/2014   CLINICAL DATA:  Chest pain and difficulty breathing acute onset  EXAM: PORTABLE CHEST - 1 VIEW  COMPARISON:  Study obtained earlier in the day; November 11, 2013  FINDINGS: There is underlying emphysematous change. There is chronic pleural thickening on the right which appears stable. There is no frank edema or consolidation. Heart is enlarged with pulmonary vascularity within normal limits. There is atherosclerotic change in the aorta. No adenopathy. No bone lesions.  IMPRESSION: Stable cardiomegaly. Chronic pleural thickening on the right. Underlying emphysema. No frank edema or consolidation.   Electronically Signed   By: Lowella Grip III M.D.   On: 04/15/2014 14:49     Assessment and Plan  1. Acute on chronic respiratory failure (on 4L/O2 at home), likely multifactorial d/t COPD/acute on chronic combined CHF - ABG with hypercapnia, likely chronic, d-dimer negative - 2D echo with new low EF (40-45%) and WMA (akinesis of the apicalinferior myocardium, akinesis of themid-apicalanteroseptal myocardium), G1DD, PA pressure 49 - s/p diuresis, now on oral Lasix - received 28meq KCl yesterday - will start 88meq daily  2. NSTEMI/CAD - cath 04/18/14: severe 2V calcific CAD with totally occluded LAD and high grade prox disease in dominant LCx that collateralizes her LAD - felt to be poor candidate for CABG; plan high risk PCI of LCx with possible rotational atherectomy tomorrow - received IV hydration post cath along with IV Lasix. Will give lower dose hydration tomorrow in prep for 8:30am procedure - continue aspirin, statin, Plavix (likely cannot tolerate b-blocker due to severe COPD) - risks and benefits of cardiac catheterization were again reviewed with the patient to include bleeding, infection,  kidney damage, stroke, heart attack, death. The patient understands these risks and is willing to proceed.  3. Anemia without patient-reported bleeding - Hgb 10 on admission, downtrended to 8.8 and has remained stable (baseline appears 10-11) - will place order for hemoccult but will defer further work-up per IM  4. 6. PVD  - known AAA, f/u duplex was due 07/2014 but cath shows "large complex AAA with severe distal aortic disease and probable high-grade ostial disease in bilateral iliacs" - will d/w MD whether this means vascular needs to see in-house  5. Essential HTN 6. H/o SVT/Vtach,on amiodarone 7. Carotid stenosis, followed by vascular surgery 8. Leukocytosis in setting of steroids, trending down  Signed, Melina Copa PA-C Patient seen and examined. I agree with the assessment and plan as detailed above. See also my additional thoughts below.   I agree with the assessment as outlined above.  Plans are for PCI tomorrow  Dola Argyle, MD, [facility] 04/19/2014 10:43 AM

## 2014-04-19 NOTE — Progress Notes (Signed)
Pt transferred to unit this morning due to hypercarbia. Earlier today she was asymptomatic - not complaining of SOB, HR stable, POx stable. When PCO2 was found to be 86 on ABG, IM ordered BiPAP and transferred to [facility]. Upon arrival to unit she was stable but irritated with BiPAP.  Nursing attempted to wean to venti-mask this afternoon. However, the patient began to get less responsive, less engaging, and slightly confused despite normal sats. This was changed back to BiPAP. She did have issues with oxygenation at first but this resolved. Nursing does report desats when she lies on her back. Dr. Ron Parker asked me to check on the patient this afternoon to reassess how she was doing given plans for cath tomorrow. When I walked in the patient appeared weak with increased respiratory effort and tachycardia. Tele sinus tach. EKG sinus tach with RBBB. Exam with diffusely diminished BS sounds. I called Dr. Stanford Breed to bedside to see her since he knows her well. He believes that her severe lung disease is likely the driver for this admission rather than an ACS. She denies any recent or current chest pain. Mental status currently clear. Portable CXR ordered. She missed oral Lasix this AM due to transfer thus will give 40mg  IV Lasix now. Foley placed due to resp status. IM was made aware and ordered further Solu-Medrol. Pulmonology consutled for further recommendations. Of note Dr. Stanford Breed clarified code status and at present time the patient wishes to remain full code.  Bennye Nix PA-C

## 2014-04-19 NOTE — Progress Notes (Signed)
CRITICAL VALUE ALERT  Critical value received:  CO2-86  Date of notification:  04/19/14  Time of notification:  0923  Critical value read back:Yes.    Nurse who received alert:  Glenna Fellows  MD notified (1st page):  MD Allyson Sabal  Time of first page:  [phone]  MD notified (2nd page):  Time of second page:  Responding MD:  MD Allyson Sabal  Time MD responded:  [phone]

## 2014-04-19 NOTE — Progress Notes (Signed)
CRITICAL VALUE ALERT  Critical value received:  CO2 45  Date of notification:  04/19/2014   Time of notification:  0638  Critical value read back:Yes.    Nurse who received alert:  Ronnette Hila, RN   MD notified (1st page):  K Schorr  Time of first page:  [phone]

## 2014-04-20 ENCOUNTER — Ambulatory Visit: Payer: Medicare Other | Admitting: Nurse Practitioner

## 2014-04-20 ENCOUNTER — Encounter (HOSPITAL_COMMUNITY)
Admission: EM | Disposition: A | Discharge status: Home Health | Payer: Self-pay | Source: Home / Self Care | Attending: Internal Medicine

## 2014-04-20 ENCOUNTER — Ambulatory Visit (HOSPITAL_COMMUNITY): Admission: RE | Admit: 2014-04-20 | Payer: Medicare Other | Source: Ambulatory Visit | Admitting: Cardiovascular Disease

## 2014-04-20 DIAGNOSIS — I200 Unstable angina: Secondary | ICD-10-CM

## 2014-04-20 LAB — BASIC METABOLIC PANEL
Anion gap: 4 — ABNORMAL LOW (ref 5–15)
BUN: 20 mg/dL (ref 6–23)
CALCIUM: 8.5 mg/dL (ref 8.4–10.5)
CO2: 46 mmol/L (ref 19–32)
Chloride: 90 mmol/L — ABNORMAL LOW (ref 96–112)
Creatinine, Ser: 0.63 mg/dL (ref 0.50–1.10)
GFR calc non Af Amer: 87 mL/min — ABNORMAL LOW (ref >90)
Glucose, Bld: 125 mg/dL — ABNORMAL HIGH (ref 70–99)
POTASSIUM: 3.8 mmol/L (ref 3.5–5.1)
SODIUM: 140 mmol/L (ref 135–145)

## 2014-04-20 LAB — CBC
HEMATOCRIT: 31.8 % — AB (ref 36.0–46.0)
HEMOGLOBIN: 8.6 g/dL — AB (ref 12.0–15.0)
MCH: 23.4 pg — AB (ref 26.0–34.0)
MCHC: 27 g/dL — ABNORMAL LOW (ref 30.0–36.0)
MCV: 86.6 fL (ref 78.0–100.0)
Platelets: 271 10*3/uL (ref 150–400)
RBC: 3.67 MIL/uL — ABNORMAL LOW (ref 3.87–5.11)
RDW: 16.9 % — ABNORMAL HIGH (ref 11.5–15.5)
WBC: 11.9 10*3/uL — ABNORMAL HIGH (ref 4.0–10.5)

## 2014-04-20 LAB — POCT I-STAT 3, ART BLOOD GAS (G3+)
ACID-BASE EXCESS: 23 mmol/L — AB (ref 0.0–2.0)
Bicarbonate: 51.6 mEq/L — ABNORMAL HIGH (ref 20.0–24.0)
O2 Saturation: 92 %
PCO2 ART: 77.8 mmHg — AB (ref 35.0–45.0)
PO2 ART: 66 mmHg — AB (ref 80.0–100.0)
pH, Arterial: 7.43 (ref 7.350–7.450)

## 2014-04-20 SURGERY — PERCUTANEOUS CORONARY ROTOBLATOR INTERVENTION (PCI-R)

## 2014-04-20 MED ORDER — FUROSEMIDE 10 MG/ML IJ SOLN
40.0000 mg | Freq: Two times a day (BID) | INTRAMUSCULAR | Status: DC
  Administered 2014-04-20 – 2014-04-22 (×4): 40 mg via INTRAVENOUS
  Filled 2014-04-20 (×6): qty 4

## 2014-04-20 MED ORDER — BIOTENE DRY MOUTH MT LIQD
15.0000 mL | OROMUCOSAL | Status: DC | PRN
Start: 2014-04-20 — End: 2014-04-23

## 2014-04-20 NOTE — Care Management Note (Addendum)
Page 1 of 2   04/23/2014     3:15:27 PM CARE MANAGEMENT NOTE 04/23/2014  Patient:  Himebaugh,Savon J   Account Number:  [id number]  Date Initiated:  04/19/2014  Documentation initiated by:  DOWELL,DEBBIE  Subjective/Objective Assessment:   adm w sl pos troponin, resp failure     Action/Plan:   lives w husband, pcp dr leonard nyland   Anticipated DC Date:  04/23/2014   Anticipated DC Plan:  HOME W HOME HEALTH SERVICES      DC Planning Services  CM consult      PAC Choice  HOME HEALTH   Choice offered to / List presented to:  C-1 Patient        HH arranged  HH-1 RN  HH-10 DISEASE MANAGEMENT  HH-2 PT  HH-3 OT  HH-4 NURSE'S AIDE      HH agency  [facility]   Status of service:  Completed, signed off Medicare Important Message given?  YES (If response is "NO", the following Medicare IM given date fields will be blank) Date Medicare IM given:  04/20/2014 Medicare IM given by:  DOWELL,DEBBIE Date Additional Medicare IM given:  04/22/2014 Additional Medicare IM given by:  SARAH BROWN  Discharge Disposition:  HOME W HOME HEALTH SERVICES  Per UR Regulation:  Reviewed for med. necessity/level of care/duration of stay  If discussed at Long Length of Stay Meetings, dates discussed:   04/21/2014    Comments:  04/23/14 15:10 CM met with pt in room to offer choce of home health agency.  Pt chooses [facility] to render HHPT/OT/RN/Aide. Address and contact information verified by pt.  Pt's ride has brought pt's O2 [place] the [facility]opital.  Referal called to [facility] rep, Lecretia.  No other CM needs were communicated.Sarah Jeffries, BSN, CM [phone].  2/23 1200n debbie dowell rn,bsn pt transf to [facility] today 2/23.   

## 2014-04-20 NOTE — Progress Notes (Signed)
Physical Therapy Treatment Patient Details Name: [name] [name] MRN: [id number] DOB: 19-Dec-1940 Today's Date: 04/20/2014    History of Present Illness 74 yr old with CAD, hypertension, hyperlipidemia, SVT, AAA, COPD, bladder cancer and cerebrovascular disease sent from cardiology office for dyspnea that has worsened over the past 1 week. She has severe dyspnea with any activity and some dyspnea at rest.     PT Comments    Pt progressing very slowly towards physical therapy goals. Was able to tolerate standing at EOB with minimal pre-gait activity, however pt continually asking to lay back down throughout session. Pt anticipates d/c home, however if she does not make improvements with mobility and tolerance for functional activity next session, may need to consider SNF. Will continue to follow.   Follow Up Recommendations  Home health PT; 24 hour supervision     Equipment Recommendations  None recommended by PT    Recommendations for Other Services       Precautions / Restrictions Precautions Precautions: Fall    Mobility  Bed Mobility Overal bed mobility: Needs Assistance Bed Mobility: Supine to Sit;Sit to Supine     Supine to sit: Min guard Sit to supine: Min guard   General bed mobility comments: use of bedrail  Transfers Overall transfer level: Needs assistance Equipment used: None Transfers: Sit to/from Stand Sit to Stand: Min assist         General transfer comment: Pt performed sit<>stand x3 at EOB. Was able to tolerate some pre-gait activity of marching in place but quickly fatigued.   Ambulation/Gait             General Gait Details: Unable to tolerate at this time.    Stairs            Wheelchair Mobility    Modified Rankin (Stroke Patients Only)       Balance Overall balance assessment: Needs assistance Sitting-balance support: Feet supported;No upper extremity supported Sitting balance-Leahy Scale: Fair     Standing balance  support: Bilateral upper extremity supported;During functional activity Standing balance-Leahy Scale: Poor                      Cognition Arousal/Alertness: Awake/alert Behavior During Therapy: Anxious Overall Cognitive Status: Within Functional Limits for tasks assessed                      Exercises      General Comments        Pertinent Vitals/Pain Pain Assessment: No/denies pain    Home Living                      Prior Function            PT Goals (current goals can now be found in the care plan section) Acute Rehab PT Goals Patient Stated Goal: home PT Goal Formulation: With patient Time For Goal Achievement: 05/01/14 Potential to Achieve Goals: Good Progress towards PT goals: Not progressing toward goals - comment    Frequency  Min 3X/week    PT Plan Current plan remains appropriate    Co-evaluation             End of Session Equipment Utilized During Treatment: Gait belt;Oxygen Activity Tolerance: Patient limited by fatigue;Treatment limited secondary to medical complications (Comment) (DOE, V-tach) Patient left: in bed;with nursing/sitter in room;with family/visitor present;with call bell/phone within reach     Time: 1425-1445 PT Time Calculation (min) (ACUTE ONLY): 20  min  Charges:  $Therapeutic Activity: 8-22 mins                    G Codes:      Rolinda Roan 05-04-14, 2:55 PM   Rolinda Roan, PT, DPT Acute Rehabilitation Services Pager: [phone]

## 2014-04-20 NOTE — Consult Note (Signed)
PULMONARY / CRITICAL CARE MEDICINE   Name: [name] [name] MRN: [id number] DOB: 10-Mar-1940    ADMISSION DATE:  04/15/2014 CONSULTATION DATE:  04/19/2014  REFERRING MD :  Allyson Sabal  CHIEF COMPLAINT:  SOB  INITIAL PRESENTATION: 74 year old female with end stage COPD (MW patient) and extensive cardiovascular history presented from cardiology office 2/19 with dyspnea and chest pain.  EKG showed new RBBB. She was admitted with COPD exacerbation and chest pain. Cath lab 2/22 showed disease to LAD and LCX. Plan was to go to cath lab 2/24 for PCI, however on 2/23 she had respiratory distress requiring BiPAP. PCCM consulted.  STUDIES:  PFTs 12/14/2012 FEV1 0.49 (21%) improves by 31%  2/20 Echo > new low EF (40-45%) and akinesis of the apicalinferior myocardium, akinesis of themid-apicalanteroseptal myocardium, Grade 1 DD, PA pressure 49  SIGNIFICANT EVENTS: 2/19 admitted for CP and AECOPD 2/22 cath > old LAD occlusion and new LCX stenosis 2/23 respiratory distress, started BiPAP, to SDU.   HISTORY OF PRESENT ILLNESS:  74 year old female with PMH as below which includes COPD (MW patient on 4L home O2), CAD (known LAD occlusion with collaterals), SVT, AAA, CHF, and A-fib. She presented to cardiology office 2/19 complaining of profound dyspnea on exertion. At that time it was recommended that she come to ED, however she did not want to do this, so some medicatin changes were made and she was referred for outpatient CXR. On her way to the radiology center she developed acute onset chest pain and EMS was called in ED she was dyspneic and had transient chest pressure. New RBBB and TWI on EKG but no troponin elevation. She was admitted to hospitalists and treated for COPD exacerbation. Cardiology was consulted the following day after a small rise in serum troponin. They recommended cardiac cath, however, she was opposed initially. 2/21 she changed her mind and decided to undergo cath. 2/22 she was found to have  complete occlusion of LAD (which was known) and a new LCX stenosis. At that time she had no complaints of CP or SOB. The plan was to go back to cath lab 2/24 for high risk PCI, however, 2/23 she developed acute onset SOB. She was transferred to SDU and started on BiPAP. ABG consistently indicated worsening respiratory acidosis. PCCM asked to see.   SUBJECTIVE:  Comfortable on [place] She wore BiPAP overnight, some this am Does not want to wear BiPAp tionight  VITAL SIGNS: Temp:  [97.5 F (36.4 C)-98.7 F (37.1 C)] 98 F (36.7 C) (02/24 1700) Pulse Rate:  [85-114] 89 (02/24 1200) Resp:  [15-26] 15 (02/24 1200) BP: (95-147)/(52-115) 95/62 mmHg (02/24 1700) SpO2:  [93 %-100 %] 95 % (02/24 1700) FiO2 (%):  [40 %] 40 % (02/24 0855) HEMODYNAMICS:   VENTILATOR SETTINGS: Vent Mode:  [-] BIPAP FiO2 (%):  [40 %] 40 % Set Rate:  [16 bmp] 16 bmp PEEP:  [5 cmH20] 5 cmH20 INTAKE / OUTPUT:  Intake/Output Summary (Last 24 hours) at 04/20/14 1704 Last data filed at 04/20/14 1423  Gross per 24 hour  Intake    700 ml  Output   2460 ml  Net  -1760 ml    PHYSICAL EXAMINATION: General:  Obese female comfortable Neuro:  Alert, oriented x 3, non-focal HEENT:  /AT, no JVD noted, PERRL Cardiovascular:  RRR, no MRG Lungs:  Poor air movement, no wheeze Abdomen:  Obese, soft, non-tender, non-distended Musculoskeletal:  No acute deformity Skin:  Grossly intact  LABS:  CBC  Recent Labs Lab 04/18/14 2012 04/19/14 0505 04/20/14 0310  WBC 13.8* 12.4* 11.9*  HGB 9.3* 8.6* 8.6*  HCT 33.9* 31.1* 31.8*  PLT 317 266 271   Coag's  Recent Labs Lab 04/18/14 0554  INR 0.95   BMET  Recent Labs Lab 04/17/14 0430 04/18/14 0932 04/18/14 2012 04/19/14 0505 04/20/14 0310  NA 139  --   --  143 140  K 3.4* 3.5  --  3.9 3.8  CL 93*  --   --  92* 90*  CO2 38*  --   --  45* 46*  BUN 16  --   --  15 20  CREATININE 0.72  --  0.61 0.57 0.63  GLUCOSE 143*  --   --  131* 125*    Electrolytes  Recent Labs Lab 04/15/14 1700  04/17/14 0430 04/19/14 0505 04/20/14 0310  CALCIUM  --   < > 8.8 8.5 8.5  MG 1.7  --   --   --   --   < > = values in this interval not displayed. Sepsis Markers No results for input(s): LATICACIDVEN, PROCALCITON, O2SATVEN in the last 168 hours. ABG  Recent Labs Lab 04/19/14 1550 04/19/14 1808 04/20/14 1336  PHART 7.284* 7.347* 7.430  PCO2ART 95.8* 85.7* 77.8*  PO2ART 99.2 64.5* 66.0*   Liver Enzymes  Recent Labs Lab 04/15/14 1700 04/16/14 0535 04/17/14 0430  AST 24 26 22   ALT 25 24 23   ALKPHOS 53 50 44  BILITOT 0.7 0.7 0.4  ALBUMIN 3.7 3.3* 3.2*   Cardiac Enzymes  Recent Labs Lab 04/15/14 1417  04/15/14 2307 04/16/14 0535 04/16/14 1358  TROPONINI  --   < > 0.32* 0.57* 0.35*  PROBNP 230.0*  --   --   --   --   < > = values in this interval not displayed. Glucose No results for input(s): GLUCAP in the last 168 hours.  Imaging Dg Chest Port 1 View  04/19/2014   CLINICAL DATA:  Shortness of breath, weakness and respiratory distress for 1 day.  EXAM: PORTABLE CHEST - 1 VIEW  COMPARISON:  04/15/2014  FINDINGS: Stable cardiomegaly without definite edema or CHF. Limited exam because of positioning. Ill-defined increased bibasilar opacities concerning for basilar atelectasis and/or developing airspace disease. Left effusion not excluded. No pneumothorax. Trachea midline. Atherosclerosis of the aorta. Background emphysema suspected.  IMPRESSION: Stable cardiomegaly without CHF  Ill-defined bibasilar atelectasis versus developing airspace disease  Suspect small left effusion.   Electronically Signed   By: Jerilynn Mages.  Shick M.D.   On: 04/19/2014 16:12     ASSESSMENT / PLAN:  Acute on chronic hypercarbic/hypoxemic respiratory failure 2nd to severe COPD, improved - BiPAP > change to prn and qhs - Titrate FiO2 to keep SpO2 90-95% - Scheduled Brovana - budesonide bid - PRN xopenex - Follow CXR, ABG - Solumedrol to TID -  Still desires full code and would endorse intubation if necessary   CAP - Abx: ceftriaxone, start date 2/19 > - Abx: azithro, start date 2/19 > - Follow WBC and fever curve  Acute on chronic systolic/diastolic CHF NSTEMI- known LAD 100%, new LCX stenosis HTN PAF - currently sinus - Management per cardiology - should be stable for cath on 5/10  Acute metabolic encephalopathy 2nd to hypercarbia - RASS goal: 0 - Monitor   Baltazar Apo, MD, PhD 04/20/2014, 5:07 PM [facility] [phone] or if no answer [phone]

## 2014-04-20 NOTE — Progress Notes (Signed)
Progress Note  Subjective:    On BiPAP. Improved according to husband.   Objective:   Temp:  [97.5 F (36.4 C)-98.7 F (37.1 C)] 98.6 F (37 C) (02/24 0800) Pulse Rate:  [82-114] 90 (02/24 0938) Resp:  [14-26] 20 (02/24 0938) BP: (126-149)/(52-115) 126/54 mmHg (02/24 0800) SpO2:  [91 %-100 %] 100 % (02/24 0938) FiO2 (%):  [35 %-40 %] 40 % (02/24 0855) Last BM Date: 04/18/14  Filed Weights   04/17/14 0644 04/18/14 0528 04/19/14 0559  Weight: 181 lb 1.6 oz (82.146 kg) 182 lb 3.2 oz (82.645 kg) 179 lb 0.2 oz (81.2 kg)    Intake/Output Summary (Last 24 hours) at 04/20/14 0955 Last data filed at 04/20/14 0745  Gross per 24 hour  Intake      0 ml  Output    540 ml  Net   -540 ml    Telemetry: Sinus tachycardia  Physical Exam: General: Obese elderly white female, alert. On BiPAP.  HEENT: PERRL, moist mucus membranes.  Neck: Supple, no lymphadenopathy or carotid bruits Lungs: Decreased air entry bilaterally.  Heart: Tachycardia, no murmurs, gallops, or rubs Abdomen: Soft, non-tender, non-distended, BS + Extremities: No cyanosis, clubbing, or edema Neurologic: Alert, on BiPAP. Moves all extremities spontaneously.    Lab Results:  Basic Metabolic Panel:  Recent Labs Lab 04/15/14 1700  04/17/14 0430 04/18/14 0932 04/18/14 2012 04/19/14 0505 04/20/14 0310  NA  --   < > 139  --   --  143 140  K  --   < > 3.4* 3.5  --  3.9 3.8  CL  --   < > 93*  --   --  92* 90*  CO2  --   < > 38*  --   --  45* 46*  GLUCOSE  --   < > 143*  --   --  131* 125*  BUN  --   < > 16  --   --  15 20  CREATININE 0.72  < > 0.72  --  0.61 0.57 0.63  CALCIUM  --   < > 8.8  --   --  8.5 8.5  MG 1.7  --   --   --   --   --   --   < > = values in this interval not displayed.  Liver Function Tests:  Recent Labs Lab 04/15/14 1700 04/16/14 0535 04/17/14 0430  AST 24 26 22   ALT 25 24 23   ALKPHOS 53 50 44  BILITOT 0.7 0.7 0.4  PROT 6.0 5.7* 5.5*  ALBUMIN 3.7 3.3* 3.2*     CBC:  Recent Labs Lab 04/18/14 2012 04/19/14 0505 04/20/14 0310  WBC 13.8* 12.4* 11.9*  HGB 9.3* 8.6* 8.6*  HCT 33.9* 31.1* 31.8*  MCV 85.4 84.1 86.6  PLT 317 266 271    Cardiac Enzymes:  Recent Labs Lab 04/15/14 2307 04/16/14 0535 04/16/14 1358  TROPONINI 0.32* 0.57* 0.35*    BNP:  Recent Labs  11/11/13 1424 12/29/13 1238 04/15/14 1417  PROBNP 671.3* 195.0* 230.0*    Coagulation:  Recent Labs Lab 04/18/14 0554  INR 0.95    Radiology: Dg Chest Port 1 View  04/19/2014   CLINICAL DATA:  Shortness of breath, weakness and respiratory distress for 1 day.  EXAM: PORTABLE CHEST - 1 VIEW  COMPARISON:  04/15/2014  FINDINGS: Stable cardiomegaly without definite edema or CHF. Limited exam because of positioning. Ill-defined increased bibasilar opacities concerning for basilar atelectasis and/or developing  airspace disease. Left effusion not excluded. No pneumothorax. Trachea midline. Atherosclerosis of the aorta. Background emphysema suspected.  IMPRESSION: Stable cardiomegaly without CHF  Ill-defined bibasilar atelectasis versus developing airspace disease  Suspect small left effusion.   Electronically Signed   By: Jerilynn Mages.  Shick M.D.   On: 04/19/2014 16:12    ECG: Sinus tachycardia, RBBB   Medications:   Scheduled Medications: . amiodarone  200 mg Oral QHS  . arformoterol  15 mcg Nebulization BID  . aspirin EC  81 mg Oral Daily  . azithromycin  500 mg Intravenous Q24H  . budesonide  0.5 mg Nebulization BID  . cefTRIAXone (ROCEPHIN)  IV  1 g Intravenous Q24H  . clopidogrel  75 mg Oral QHS  . diltiazem  300 mg Oral Daily  . enoxaparin (LOVENOX) injection  40 mg Subcutaneous Q24H  . flumazenil  0.2 mg Intravenous Once  . furosemide  40 mg Oral Daily  . latanoprost  1 drop Both Eyes QHS  . methylPREDNISolone (SOLU-MEDROL) injection  60 mg Intravenous 3 times per day  . nystatin  5 mL Mouth/Throat QID  . pantoprazole  40 mg Oral Daily  . potassium chloride  20  mEq Oral Daily  . pravastatin  80 mg Oral QHS  . sodium chloride  3 mL Intravenous Q12H  . sodium chloride  3 mL Intravenous Q12H     PRN Medications:  sodium chloride, acetaminophen, antiseptic oral rinse, guaiFENesin-dextromethorphan, levalbuterol, ondansetron (ZOFRAN) IV, sodium chloride   Assessment and Plan:  74 y/o F w/ PMHx HTN, HLD, h/o SVT on Amiodarone, AAA, COPD on home O2, and multiple other co-morbidities, admitted on 04/15/14 for SOB and chest pain.   Acute on Chronic Respiratory Failure: Dyspnea worsened yesterday, PCCM consulted, thought to be 2/2 CAP/COPD exacerbation. Started on ABx and steroids. ABG significant for hypercapneic respiratory failure. On BiPAP, tolerating well this AM. Given significant respiratory issues, not candidate for PCI at this time until breathing significantly improved. -Continue BiPAP per PCCM -Brovana, Pulmicort, Xopenex -Rocephin + Azithro for CAP coverage -Increase Lasix to 40 mg IV bid for now -Solu-Medrol 60 mg IV q8h  NSTEMI: Patient how s/p LHC on 04/18/14 which showed severe CAD involving a chronic total occlusion of the LAD and high-grade proximal lesion of the dominant LCx. Also showed severe PAD (right iliac) and complex AAA. Was supposed to go for PCI of LCx lesion, however, developed respiratory failure as described above.  -Hold off on PCI until improvement in respiratory symptoms.  -Continue ASA + Plavix -Continue Cardizem CD 300 mg qd  H/o SVT on Amiodarone: Currently sinus tachycardia, RBBB.  -Continue Amiodarone 200 mg daily -Cardizem as above  Chronic Combined CHF: ECHO from 04/16/14 showed EF of 40-45%, akinesis of the mid-apicalinferior and apicalanteroseptal myocardium. Grade 1 diastolic dysfunction. Most likely ischemic cardiomyopathy given severe CAD as described above.  -Lasix 40 mg bid for now -Cardizem   Natasha Bence, MD PGY-2 Internal Medicine Pager: [phone]  Patient seen and examined and history reviewed.  Agree with above findings and plan. Patient presented with ACS with unstable angina. Cath showed chronic occlusion of the LAD. She has a high grade stenosis in the proximal LCx which is a dominant vessel and supplies collaterals to the LAD. Developed acute respiratory failure yesterday with hypoxemia and hypercapnea. Now on BIPAP. CCM on board. She denies chest pain but is dyspneic at rest. EDP at cath showed an EDP of 22 mm Hg so she may benefit from continued IV diuresis. Otherwise continue  steroids, nebs, antibiotics, etc. She will not be a candidate for PCI until there is significant improvement in respiratory status. PCI will be high risk in the best of circumstances.   Laityn Bensen [place], [place] 04/20/2014 10:18 AM

## 2014-04-20 NOTE — Progress Notes (Signed)
Follow-up:  Notified by RN regarding concern for (R) facial droop after removing BiPAP briefly. Reports no other focal neurological findings. At bedside pt noted awake and alert on BiPAP. RN removed BiPAP and placed on NRB to further assess pt. Pt is oriented x 2 and is able to answer questions appropriately. There does appear to be a slight (R) facila droop initially, however her smile appears symmetric. Though unable to fully participate in neurological exam d/t her respiratory status CN II-IV and IX-XII appear grossly intact. MAE x 4 and grips are equal bil. During assessment pt relates that she had Bell's Palsy "a long time ago" and has had residual (R) facial droop since that time. Assessment/Plan: 1. (R) facial droop: Residual from remote episode of Bell's Palsy per pt history. No other focal neurological findings on limited neuro exam. Pt to be placed back on BiPAP. Will continue to monitor closely in SDU.  Jeryl [place], NP-C [facility] Pager [phone]

## 2014-04-20 NOTE — Progress Notes (Addendum)
[facility] PROGRESS NOTE  EMER ONNEN ZCH:[id number] DOB: August 01, 1940 DOA: 04/15/2014 PCP: Sherrie Mustache, MD  Assessment/Plan: Active Problems:   Essential hypertension   Coronary atherosclerosis   Acute-on-chronic respiratory failure   Acute exacerbation of chronic obstructive pulmonary disease (COPD)   Anxiety disorder   Chronic diastolic congestive heart failure   COPD (chronic obstructive pulmonary disease)   Elevated troponin   Chest pain   Congestive heart disease   Acute respiratory failure with hypercapnia   CAP (community acquired pneumonia)   Acute on chronic hypoxic /hypercapnic  respiratory failure (on 4L/O2 at home), likely multifactorial d/t COPD/CHF Multifactorial , CHF/COPD exacerbation Continue step down because of hypercapnic respiratory failure and BiPAP, repeat ABG Continue medications for acute on chronic diastolic congestive heart failure and COPD exacerbation Chest x-ray suspicious for developing airspace disease continue Rocephin, azithromycin D-dimer negative,, troponin abnl 2-D echo  Findings as below Increase Lasix to 40 mg IV bid for now -Solu-Medrol 60 mg IV q8h Brovana, Pulmicort, Xopenex PC CM following  Essential hypertension-continue diltiazem,  Lasix  Anemia without patient-reported bleeding Hemoglobin 8.6, stable   Acute-on-chronic respiratory failure , oxygen dependent at home on 4 L Continue BiPAP, repeat ABG Hypercapnia exacerbated by klonopin and tylenol with codeine , avoid these medications  Peripheral vascular disease known AAA, f/u duplex was due 07/2014 but cath shows "large complex AAA with severe distal aortic disease and probable high-grade ostial disease in bilateral iliacs" Patient will need outpatient vascular follow-up    History of CVA Continue aspirin and Plavix   Coronary artery disease Continue with aspirin and Plavix, and cycle cardiac enzymes Troponin trending up S/p cath 2/22  cath 04/18/14:  severe 2V calcific CAD with totally occluded LAD and high grade prox disease in dominant LCx that collateralizes her LAD - felt to be poor candidate for CABG; plan high risk PCI of LCx with possible rotational atherectomy when the patient is stable from a respiratory standpoint - received IV hydration post cath along with IV Lasix. Will give lower dose hydration tomorrow in prep for 8:30am procedure - continue aspirin, statin, Plavix (likely cannot tolerate b-blocker due to severe COPD).  Will need VVS f/u for PAD.  Hold off on PCI until improvement in respiratory symptoms.  -Continue ASA + Plavix -- 2D ECHO with new low EF and WMA    Gastroesophageal reflux Continue with Protonix  Atrial fibrillation continue with amiodarone, Cardizem  Anxiety disorder-continue with Klonopin, minimal dose if needed     Code Status: full Family Communication: patient's husband is by the bedside Disposition Plan: Continue step down, cardiology to decide on the timing of the PCI   Brief narrative: HPI:  74 yr old with CAD, hypertension, hyperlipidemia, SVT, AAA, COPD, bladder cancer and cerebrovascular disease sent from cardiology office for dyspnea that has worsened over the past 1 week. She has severe dyspnea with any activity and some dyspnea at rest. No fevers, chills or productive cough. No hemoptysis. She denies orthopnea or PND but she has had increased weight. She saw Dr. Stanford Breed her cardiologist today who increased her Lasix and recommended admission for acute on chronic heart failure however patient refused. When she was outside the medical building she did develop some chest pain and increased shortness of breath and EMS was called. She was brought here to the emergency room. Upon arrival pt was pale with sats of 91% on 4 L, tachypneic in 30's with pressure of 90 sbp. EKG showed RBB. EMS gave 5 mg albuterol .  She had about a 20-30 minutes some tightness in the center of her chest. She  denies any chest pain currently. She still feels short of breath. She was given one nebulizer treatment around and feels a little bit better. She's on baseline oxygen at 4 L/m. She has had some increased cough but no change in color of her sputum. She denies any known fevers. She denies any nausea or vomiting ER workup : chest x-ray does not show edema. Her BNP is mildly elevated. There is no evidence of pneumonia. She did have some chest pain but her troponin is negative. Her EKG shows a right bundle branch block which is new from her past EKG but no ST elevation is noted. She's been given another nebulizer treatment here in the ED as well as Solu-Medrol. She's feeling better ,admitted for CHF ,COPD exacerbation.   Consultants:  Cardiology  Procedures:  None  Antibiotics: None  HPI/Subjective: Patient is awake, able to participate in her exam  Objective: Filed Vitals:   04/20/14 0800 04/20/14 0855 04/20/14 0937 04/20/14 0938  BP: 126/54     Pulse: 86   90  Temp: 98.6 F (37 C)     TempSrc: Oral     Resp: 15  21 20   Height:      Weight:      SpO2: 100% 100%  100%    Intake/Output Summary (Last 24 hours) at 04/20/14 1054 Last data filed at 04/20/14 0745  Gross per 24 hour  Intake      0 ml  Output    540 ml  Net   -540 ml    Exam:  General: Obese elderly white female, alert. On BiPAP.  HEENT: PERRL, moist mucus membranes.  Neck: Supple, no lymphadenopathy or carotid bruits Lungs: Decreased air entry bilaterally.  Heart: Tachycardia, no murmurs, gallops, or rubs Abdomen: Soft, non-tender, non-distended, BS + Extremities: No cyanosis, clubbing, or edema Neurologic: Alert, on BiPAP. Moves all extremities spontaneously.        Data Reviewed: Basic Metabolic Panel:  Recent Labs Lab 04/15/14 1423 04/15/14 1700 04/16/14 0535 04/17/14 0430 04/18/14 0932 04/18/14 2012 04/19/14 0505 04/20/14 0310  NA 138  --  140 139  --   --  143 140  K 3.5  --  3.5 3.4*  3.5  --  3.9 3.8  CL 92*  --  93* 93*  --   --  92* 90*  CO2 37*  --  41* 38*  --   --  45* 46*  GLUCOSE 118*  --  140* 143*  --   --  131* 125*  BUN 9  --  9 16  --   --  15 20  CREATININE 0.69 0.72 0.61 0.72  --  0.61 0.57 0.63  CALCIUM 9.3  --  8.7 8.8  --   --  8.5 8.5  MG  --  1.7  --   --   --   --   --   --     Liver Function Tests:  Recent Labs Lab 04/15/14 1417 04/15/14 1700 04/16/14 0535 04/17/14 0430  AST 19 24 26 22   ALT 22 25 24 23   ALKPHOS 55 53 50 44  BILITOT 0.5 0.7 0.7 0.4  PROT 6.6 6.0 5.7* 5.5*  ALBUMIN 3.9 3.7 3.3* 3.2*   No results for input(s): LIPASE, AMYLASE in the last 168 hours. No results for input(s): AMMONIA in the last 168 hours.  CBC:  Recent Labs Lab  04/15/14 1417 04/15/14 1423  04/17/14 0430 04/18/14 0554 04/18/14 2012 04/19/14 0505 04/20/14 0310  WBC 17.8* 16.5*  < > 15.8* 16.0* 13.8* 12.4* 11.9*  NEUTROABS 14.9* 13.2*  --   --   --   --   --   --   HGB 10.0* 10.2*  < > 8.8* 8.8* 9.3* 8.6* 8.6*  HCT 31.8* 36.4  < > 31.3* 32.1* 33.9* 31.1* 31.8*  MCV 79.2 85.8  < > 83.9 84.5 85.4 84.1 86.6  PLT 348.0 317  < > 271 293 317 266 271  < > = values in this interval not displayed.  Cardiac Enzymes:  Recent Labs Lab 04/15/14 1700 04/15/14 2307 04/16/14 0535 04/16/14 1358  TROPONINI <0.03 0.32* 0.57* 0.35*   BNP (last 3 results)  Recent Labs  04/15/14 1423  BNP 229.9*    ProBNP (last 3 results)  Recent Labs  11/11/13 1424 12/29/13 1238 04/15/14 1417  PROBNP 671.3* 195.0* 230.0*      CBG: No results for input(s): GLUCAP in the last 168 hours.  Recent Results (from the past 240 hour(s))  MRSA PCR Screening     Status: None   Collection Time: 04/19/14 11:29 AM  Result Value Ref Range Status   MRSA by PCR NEGATIVE NEGATIVE Final    Comment:        The GeneXpert MRSA Assay (FDA approved for NASAL specimens only), is one component of a comprehensive MRSA colonization surveillance program. It is not intended  to diagnose MRSA infection nor to guide or monitor treatment for MRSA infections.      Studies: Dg Chest 1 View  04/15/2014   CLINICAL DATA:  74 year old female with a history of shortness of breath, hypertension  EXAM: CHEST  1 VIEW  COMPARISON:  11/11/2013  FINDINGS: Cardiomediastinal silhouette unchanged in size and contour with cardiomegaly.  Atherosclerotic calcifications of the aortic arch.  No evidence of pulmonary vascular congestion.  No pneumothorax.  Persistent opacity at the base of the right lung, present on comparison plain film studies.  Retrocardiac region not well evaluated.  No definite confluent airspace disease.  No displaced fracture.  IMPRESSION: No evidence of pulmonary edema, with stable cardiomegaly and atherosclerotic changes.  Retrocardiac region not well visualized, and if there is were further concern for acute process, a dedicated PA and lateral chest x-ray would be useful.  Persistent opacity at the right base, likely reflective of atelectasis and/ or scarring. If there were a need to further characterize the finding a CT would be required.  Signed,  Dulcy Fanny. Earleen Newport, DO  [facility]  [facility]   Electronically Signed   By: Corrie Mckusick D.O.   On: 04/15/2014 17:09   Dg Chest Port 1 View  04/19/2014   CLINICAL DATA:  Shortness of breath, weakness and respiratory distress for 1 day.  EXAM: PORTABLE CHEST - 1 VIEW  COMPARISON:  04/15/2014  FINDINGS: Stable cardiomegaly without definite edema or CHF. Limited exam because of positioning. Ill-defined increased bibasilar opacities concerning for basilar atelectasis and/or developing airspace disease. Left effusion not excluded. No pneumothorax. Trachea midline. Atherosclerosis of the aorta. Background emphysema suspected.  IMPRESSION: Stable cardiomegaly without CHF  Ill-defined bibasilar atelectasis versus developing airspace disease  Suspect small left effusion.   Electronically  Signed   By: Jerilynn Mages.  Shick M.D.   On: 04/19/2014 16:12   Dg Chest Port 1 View  04/15/2014   CLINICAL DATA:  Chest pain and difficulty breathing acute onset  EXAM: PORTABLE CHEST - 1 VIEW  COMPARISON:  Study obtained earlier in the day; November 11, 2013  FINDINGS: There is underlying emphysematous change. There is chronic pleural thickening on the right which appears stable. There is no frank edema or consolidation. Heart is enlarged with pulmonary vascularity within normal limits. There is atherosclerotic change in the aorta. No adenopathy. No bone lesions.  IMPRESSION: Stable cardiomegaly. Chronic pleural thickening on the right. Underlying emphysema. No frank edema or consolidation.   Electronically Signed   By: Lowella Grip III M.D.   On: 04/15/2014 14:49    Scheduled Meds: . amiodarone  200 mg Oral QHS  . arformoterol  15 mcg Nebulization BID  . aspirin EC  81 mg Oral Daily  . azithromycin  500 mg Intravenous Q24H  . budesonide  0.5 mg Nebulization BID  . cefTRIAXone (ROCEPHIN)  IV  1 g Intravenous Q24H  . clopidogrel  75 mg Oral QHS  . diltiazem  300 mg Oral Daily  . enoxaparin (LOVENOX) injection  40 mg Subcutaneous Q24H  . flumazenil  0.2 mg Intravenous Once  . furosemide  40 mg Intravenous BID  . latanoprost  1 drop Both Eyes QHS  . methylPREDNISolone (SOLU-MEDROL) injection  60 mg Intravenous 3 times per day  . nystatin  5 mL Mouth/Throat QID  . pantoprazole  40 mg Oral Daily  . potassium chloride  20 mEq Oral Daily  . pravastatin  80 mg Oral QHS  . sodium chloride  3 mL Intravenous Q12H  . sodium chloride  3 mL Intravenous Q12H   Continuous Infusions:    Active Problems:   Essential hypertension   Coronary atherosclerosis   Acute-on-chronic respiratory failure   Acute exacerbation of chronic obstructive pulmonary disease (COPD)   Anxiety disorder   Chronic diastolic congestive heart failure   COPD (chronic obstructive pulmonary disease)   Elevated troponin   Chest  pain   Congestive heart disease   Acute respiratory failure with hypercapnia   CAP (community acquired pneumonia)    Time spent: 40 minutes   [facility] Pager [phone]. If 7PM-7AM, please contact night-coverage at [email or web address], password [facility] 04/20/2014, 10:54 AM  LOS: 5 days

## 2014-04-21 ENCOUNTER — Encounter (HOSPITAL_COMMUNITY)
Admission: EM | Disposition: A | Discharge status: Home Health | Payer: Medicare Other | Source: Home / Self Care | Attending: Internal Medicine

## 2014-04-21 DIAGNOSIS — I2510 Atherosclerotic heart disease of native coronary artery without angina pectoris: Secondary | ICD-10-CM

## 2014-04-21 HISTORY — PX: PERCUTANEOUS CORONARY ROTOBLATOR INTERVENTION (PCI-R): SHX5484

## 2014-04-21 LAB — CBC
HCT: 30.6 % — ABNORMAL LOW (ref 36.0–46.0)
HCT: 30.8 % — ABNORMAL LOW (ref 36.0–46.0)
Hemoglobin: 8.6 g/dL — ABNORMAL LOW (ref 12.0–15.0)
Hemoglobin: 8.6 g/dL — ABNORMAL LOW (ref 12.0–15.0)
MCH: 23.4 pg — ABNORMAL LOW (ref 26.0–34.0)
MCH: 23.2 pg — ABNORMAL LOW (ref 26.0–34.0)
MCHC: 28.1 g/dL — ABNORMAL LOW (ref 30.0–36.0)
MCHC: 27.9 g/dL — ABNORMAL LOW (ref 30.0–36.0)
MCV: 83.4 fL (ref 78.0–100.0)
MCV: 83.2 fL (ref 78.0–100.0)
Platelets: 263 10*3/uL (ref 150–400)
Platelets: 292 10*3/uL (ref 150–400)
RBC: 3.67 MIL/uL — AB (ref 3.87–5.11)
RBC: 3.7 MIL/uL — ABNORMAL LOW (ref 3.87–5.11)
RDW: 16.7 % — ABNORMAL HIGH (ref 11.5–15.5)
RDW: 16.8 % — AB (ref 11.5–15.5)
WBC: 8.7 10*3/uL (ref 4.0–10.5)
WBC: 13.5 10*3/uL — ABNORMAL HIGH (ref 4.0–10.5)

## 2014-04-21 LAB — BASIC METABOLIC PANEL
Anion gap: 11 (ref 5–15)
BUN: 20 mg/dL (ref 6–23)
CHLORIDE: 85 mmol/L — AB (ref 96–112)
CO2: 45 mmol/L (ref 19–32)
CREATININE: 0.63 mg/dL (ref 0.50–1.10)
Calcium: 8.9 mg/dL (ref 8.4–10.5)
GFR, EST NON AFRICAN AMERICAN: 87 mL/min — AB (ref >90)
Glucose, Bld: 158 mg/dL — ABNORMAL HIGH (ref 70–99)
Potassium: 3.4 mmol/L — ABNORMAL LOW (ref 3.5–5.1)
Sodium: 141 mmol/L (ref 135–145)

## 2014-04-21 LAB — CREATININE, SERUM
Creatinine, Ser: 0.62 mg/dL (ref 0.50–1.10)
GFR calc Af Amer: >90 mL/min (ref >90)
GFR calc non Af Amer: 88 mL/min — ABNORMAL LOW (ref >90)

## 2014-04-21 SURGERY — PERCUTANEOUS CORONARY ROTOBLATOR INTERVENTION (PCI-R)
Anesthesia: LOCAL

## 2014-04-21 MED ORDER — MIDAZOLAM HCL 2 MG/2ML IJ SOLN
INTRAMUSCULAR | Status: AC
  Filled 2014-04-21: qty 2

## 2014-04-21 MED ORDER — POTASSIUM CHLORIDE 10 MEQ/100ML IV SOLN
10.0000 meq | INTRAVENOUS | Status: AC
  Administered 2014-04-21 (×2): 10 meq via INTRAVENOUS
  Filled 2014-04-21: qty 100

## 2014-04-21 MED ORDER — LIDOCAINE HCL (PF) 1 % IJ SOLN
INTRAMUSCULAR | Status: AC
  Filled 2014-04-21: qty 30

## 2014-04-21 MED ORDER — MORPHINE SULFATE 2 MG/ML IJ SOLN
1.0000 mg | INTRAMUSCULAR | Status: DC | PRN
  Administered 2014-04-21: 1 mg via INTRAVENOUS
  Filled 2014-04-21: qty 1

## 2014-04-21 MED ORDER — HEPARIN (PORCINE) IN NACL 2-0.9 UNIT/ML-% IJ SOLN
INTRAMUSCULAR | Status: AC
  Filled 2014-04-21: qty 1000

## 2014-04-21 MED ORDER — NITROGLYCERIN 1 MG/10 ML FOR IR/CATH LAB
INTRA_ARTERIAL | Status: AC
Start: 2014-04-21 — End: 2014-04-21
  Filled 2014-04-21: qty 10

## 2014-04-21 MED ORDER — BIVALIRUDIN 250 MG IV SOLR
INTRAVENOUS | Status: AC
  Filled 2014-04-21: qty 250

## 2014-04-21 MED ORDER — SODIUM CHLORIDE 0.9 % IV SOLN: 250.0000 mL | INTRAVENOUS | Status: DC | PRN

## 2014-04-21 MED ORDER — SODIUM CHLORIDE 0.9 % IJ SOLN
3.0000 mL | Freq: Two times a day (BID) | INTRAMUSCULAR | Status: DC
Start: 2014-04-21 — End: 2014-04-22
  Administered 2014-04-21 – 2014-04-22 (×3): 3 mL via INTRAVENOUS

## 2014-04-21 MED ORDER — SODIUM CHLORIDE 0.9 % IJ SOLN: 3.0000 mL | INTRAMUSCULAR | Status: DC | PRN

## 2014-04-21 MED ORDER — SODIUM CHLORIDE 0.9 % IV SOLN
INTRAVENOUS | Status: AC
  Administered 2014-04-21: 10:00:00 via INTRAVENOUS

## 2014-04-21 MED ORDER — VERAPAMIL HCL 2.5 MG/ML IV SOLN
INTRAVENOUS | Status: AC
  Filled 2014-04-21: qty 4

## 2014-04-21 MED ORDER — ATROPINE SULFATE 0.1 MG/ML IJ SOLN
INTRAMUSCULAR | Status: AC
  Filled 2014-04-21: qty 10

## 2014-04-21 MED ORDER — FENTANYL CITRATE 0.05 MG/ML IJ SOLN
INTRAMUSCULAR | Status: AC
  Filled 2014-04-21: qty 2

## 2014-04-21 MED ORDER — NITROGLYCERIN IN D5W 200-5 MCG/ML-% IV SOLN
INTRAVENOUS | Status: AC
  Filled 2014-04-21: qty 250

## 2014-04-21 MED ORDER — HEPARIN SODIUM (PORCINE) 1000 UNIT/ML IJ SOLN
INTRAMUSCULAR | Status: AC
  Filled 2014-04-21: qty 1

## 2014-04-21 MED ORDER — OXYCODONE-ACETAMINOPHEN 5-325 MG PO TABS
1.0000 | ORAL_TABLET | ORAL | Status: DC | PRN
  Administered 2014-04-21 – 2014-04-22 (×2): 1 via ORAL
  Filled 2014-04-21 (×2): qty 1

## 2014-04-21 NOTE — Progress Notes (Signed)
[facility] PROGRESS NOTE  ARIANNI GALLEGO JKK:[id number] DOB: Jun 05, 1940 DOA: 04/15/2014 PCP: Sherrie Mustache, MD  Assessment/Plan: Active Problems:   Essential hypertension   Coronary atherosclerosis   Acute-on-chronic respiratory failure   Acute exacerbation of chronic obstructive pulmonary disease (COPD)   Anxiety disorder   Chronic diastolic congestive heart failure   COPD (chronic obstructive pulmonary disease)   Elevated troponin   Chest pain   Congestive heart disease   Acute respiratory failure with hypercapnia   CAP (community acquired pneumonia)   Acute on chronic hypoxic /hypercapnic  respiratory failure (on 4L/O2 at home), likely multifactorial d/t COPD/CHF Multifactorial , CHF/COPD exacerbation Continue step down because of hypercapnic respiratory failure and BiPAP, repeat ABG Continue medications for acute on chronic diastolic congestive heart failure and COPD exacerbation Chest x-ray suspicious for developing airspace disease continue Rocephin, azithromycin D-dimer negative,, troponin abnl 2-D echo  Findings as below Lasix to 40 mg IV bid for now -Solu-Medrol 60 mg IV q8h, will de-escalate is improving tomorrow Brovana, Pulmicort, Xopenex PCCM feels that the patient can undergo PCI today  Essential hypertension-continue diltiazem,  Lasix  Anemia without patient-reported bleeding Hemoglobin 8.6, stable 3 days   Acute-on-chronic respiratory failure , oxygen dependent at home on 4 L Continue BiPAP, repeat ABG Hypercapnia exacerbated by klonopin and tylenol with codeine , avoid these medications   Peripheral vascular disease known AAA, f/u duplex was due 07/2014 but cath shows "large complex AAA with severe distal aortic disease and probable high-grade ostial disease in bilateral iliacs" Patient will need outpatient vascular follow-up   Hypokalemia Replete potassium  History of CVA Continue aspirin and Plavix   Coronary artery  disease Continue with aspirin and Plavix, and cycle cardiac enzymes Troponin trending up S/p cath 2/22 cath 04/18/14: severe 2V calcific CAD with totally occluded LAD and high grade prox disease in dominant LCx that collateralizes her LAD - felt to be poor candidate for CABG; plan high risk PCI of LCx with possible rotational atherectomy today - continue aspirin, statin, Plavix (likely cannot tolerate b-blocker due to severe COPD).  -- 2D ECHO with new low EF and WMA  Anticipate she can discharge tomorrow PCI is okay   Gastroesophageal reflux Continue with Protonix  Atrial fibrillation continue with amiodarone, Cardizem  Anxiety disorder-continue with Klonopin, minimal dose if needed     Code Status: full Family Communication: patient's husband is by the bedside Disposition Plan: Continue in stepdown,    Brief narrative: HPI:  74 yr old with CAD, hypertension, hyperlipidemia, SVT, AAA, COPD, bladder cancer and cerebrovascular disease sent from cardiology office for dyspnea that has worsened over the past 1 week. She has severe dyspnea with any activity and some dyspnea at rest. No fevers, chills or productive cough. No hemoptysis. She denies orthopnea or PND but she has had increased weight. She saw Dr. Stanford Breed her cardiologist today who increased her Lasix and recommended admission for acute on chronic heart failure however patient refused. When she was outside the medical building she did develop some chest pain and increased shortness of breath and EMS was called. She was brought here to the emergency room. Upon arrival pt was pale with sats of 91% on 4 L, tachypneic in 30's with pressure of 90 sbp. EKG showed RBB. EMS gave 5 mg albuterol . She had about a 20-30 minutes some tightness in the center of her chest. She denies any chest pain currently. She still feels short of breath. She was given one nebulizer treatment around and  feels a little bit better. She's on baseline  oxygen at 4 L/m. She has had some increased cough but no change in color of her sputum. She denies any known fevers. She denies any nausea or vomiting ER workup : chest x-ray does not show edema. Her BNP is mildly elevated. There is no evidence of pneumonia. She did have some chest pain but her troponin is negative. Her EKG shows a right bundle branch block which is new from her past EKG but no ST elevation is noted. She's been given another nebulizer treatment here in the ED as well as Solu-Medrol. She's feeling better ,admitted for CHF ,COPD exacerbation.   Consultants:  Cardiology  Procedures:  None  Antibiotics: None  HPI/Subjective: Patient is awake, feeling well, talkative  Objective: Filed Vitals:   04/21/14 0745 04/21/14 0800 04/21/14 1000 04/21/14 1200  BP:  123/64 136/63 115/54  Pulse: 77 81 85 81  Temp: 97.8 F (36.6 C)     TempSrc: Oral     Resp: 16 18 25 19   Height:      Weight:      SpO2: 93% 93% 93% 95%    Intake/Output Summary (Last 24 hours) at 04/21/14 1220 Last data filed at 04/21/14 1200  Gross per 24 hour  Intake 1209.17 ml  Output   2850 ml  Net -1640.83 ml    Exam:  Neuro: Alert, oriented x 3, non-focal HEENT: [place]/AT, no JVD noted, PERRL Cardiovascular: RRR, no MRG Lungs: Poor air movement, no wheeze Abdomen: Obese, soft, non-tender, non-distended Musculoskeletal: No acute deformity Skin: Grossly intact   Data Reviewed: Basic Metabolic Panel:  Recent Labs Lab 04/15/14 1700 04/16/14 0535 04/17/14 0430 04/18/14 0932 04/18/14 2012 04/19/14 0505 04/20/14 0310 04/21/14 0240  NA  --  140 139  --   --  143 140 141  K  --  3.5 3.4* 3.5  --  3.9 3.8 3.4*  CL  --  93* 93*  --   --  92* 90* 85*  CO2  --  41* 38*  --   --  45* 46* 45*  GLUCOSE  --  140* 143*  --   --  131* 125* 158*  BUN  --  9 16  --   --  15 20 20   CREATININE 0.72 0.61 0.72  --  0.61 0.57 0.63 0.63  CALCIUM  --  8.7 8.8  --   --  8.5 8.5 8.9  MG 1.7  --   --    --   --   --   --   --     Liver Function Tests:  Recent Labs Lab 04/15/14 1417 04/15/14 1700 04/16/14 0535 04/17/14 0430  AST 19 24 26 22   ALT 22 25 24 23   ALKPHOS 55 53 50 44  BILITOT 0.5 0.7 0.7 0.4  PROT 6.6 6.0 5.7* 5.5*  ALBUMIN 3.9 3.7 3.3* 3.2*   No results for input(s): LIPASE, AMYLASE in the last 168 hours. No results for input(s): AMMONIA in the last 168 hours.  CBC:  Recent Labs Lab 04/15/14 1417 04/15/14 1423  04/18/14 0554 04/18/14 2012 04/19/14 0505 04/20/14 0310 04/21/14 0240  WBC 17.8* 16.5*  < > 16.0* 13.8* 12.4* 11.9* 8.7  NEUTROABS 14.9* 13.2*  --   --   --   --   --   --   HGB 10.0* 10.2*  < > 8.8* 9.3* 8.6* 8.6* 8.6*  HCT 31.8* 36.4  < > 32.1* 33.9* 31.1*  31.8* 30.6*  MCV 79.2 85.8  < > 84.5 85.4 84.1 86.6 83.4  PLT 348.0 317  < > 293 317 266 271 263  < > = values in this interval not displayed.  Cardiac Enzymes:  Recent Labs Lab 04/15/14 1700 04/15/14 2307 04/16/14 0535 04/16/14 1358  TROPONINI <0.03 0.32* 0.57* 0.35*   BNP (last 3 results)  Recent Labs  04/15/14 1423  BNP 229.9*    ProBNP (last 3 results)  Recent Labs  11/11/13 1424 12/29/13 1238 04/15/14 1417  PROBNP 671.3* 195.0* 230.0*      CBG: No results for input(s): GLUCAP in the last 168 hours.  Recent Results (from the past 240 hour(s))  MRSA PCR Screening     Status: None   Collection Time: 04/19/14 11:29 AM  Result Value Ref Range Status   MRSA by PCR NEGATIVE NEGATIVE Final    Comment:        The GeneXpert MRSA Assay (FDA approved for NASAL specimens only), is one component of a comprehensive MRSA colonization surveillance program. It is not intended to diagnose MRSA infection nor to guide or monitor treatment for MRSA infections.      Studies: Dg Chest 1 View  04/15/2014   CLINICAL DATA:  74 year old female with a history of shortness of breath, hypertension  EXAM: CHEST  1 VIEW  COMPARISON:  11/11/2013  FINDINGS: Cardiomediastinal  silhouette unchanged in size and contour with cardiomegaly.  Atherosclerotic calcifications of the aortic arch.  No evidence of pulmonary vascular congestion.  No pneumothorax.  Persistent opacity at the base of the right lung, present on comparison plain film studies.  Retrocardiac region not well evaluated.  No definite confluent airspace disease.  No displaced fracture.  IMPRESSION: No evidence of pulmonary edema, with stable cardiomegaly and atherosclerotic changes.  Retrocardiac region not well visualized, and if there is were further concern for acute process, a dedicated PA and lateral chest x-ray would be useful.  Persistent opacity at the right base, likely reflective of atelectasis and/ or scarring. If there were a need to further characterize the finding a CT would be required.  Signed,  Dulcy Fanny. Earleen Newport, DO  [facility]  [facility]   Electronically Signed   By: Corrie Mckusick D.O.   On: 04/15/2014 17:09   Dg Chest Port 1 View  04/19/2014   CLINICAL DATA:  Shortness of breath, weakness and respiratory distress for 1 day.  EXAM: PORTABLE CHEST - 1 VIEW  COMPARISON:  04/15/2014  FINDINGS: Stable cardiomegaly without definite edema or CHF. Limited exam because of positioning. Ill-defined increased bibasilar opacities concerning for basilar atelectasis and/or developing airspace disease. Left effusion not excluded. No pneumothorax. Trachea midline. Atherosclerosis of the aorta. Background emphysema suspected.  IMPRESSION: Stable cardiomegaly without CHF  Ill-defined bibasilar atelectasis versus developing airspace disease  Suspect small left effusion.   Electronically Signed   By: Jerilynn Mages.  Shick M.D.   On: 04/19/2014 16:12   Dg Chest Port 1 View  04/15/2014   CLINICAL DATA:  Chest pain and difficulty breathing acute onset  EXAM: PORTABLE CHEST - 1 VIEW  COMPARISON:  Study obtained earlier in the day; November 11, 2013  FINDINGS: There is underlying emphysematous  change. There is chronic pleural thickening on the right which appears stable. There is no frank edema or consolidation. Heart is enlarged with pulmonary vascularity within normal limits. There is atherosclerotic change in the aorta. No adenopathy. No bone lesions.  IMPRESSION: Stable cardiomegaly. Chronic pleural thickening  on the right. Underlying emphysema. No frank edema or consolidation.   Electronically Signed   By: Lowella Grip III M.D.   On: 04/15/2014 14:49    Scheduled Meds: . amiodarone  200 mg Oral QHS  . arformoterol  15 mcg Nebulization BID  . aspirin EC  81 mg Oral Daily  . azithromycin  500 mg Intravenous Q24H  . budesonide  0.5 mg Nebulization BID  . cefTRIAXone (ROCEPHIN)  IV  1 g Intravenous Q24H  . clopidogrel  75 mg Oral QHS  . diltiazem  300 mg Oral Daily  . enoxaparin (LOVENOX) injection  40 mg Subcutaneous Q24H  . furosemide  40 mg Intravenous BID  . latanoprost  1 drop Both Eyes QHS  . methylPREDNISolone (SOLU-MEDROL) injection  60 mg Intravenous 3 times per day  . nystatin  5 mL Mouth/Throat QID  . pantoprazole  40 mg Oral Daily  . potassium chloride  20 mEq Oral Daily  . pravastatin  80 mg Oral QHS  . sodium chloride  3 mL Intravenous Q12H  . sodium chloride  3 mL Intravenous Q12H  . sodium chloride  3 mL Intravenous Q12H   Continuous Infusions: . sodium chloride 50 mL/hr at 04/21/14 3976    Active Problems:   Essential hypertension   Coronary atherosclerosis   Acute-on-chronic respiratory failure   Acute exacerbation of chronic obstructive pulmonary disease (COPD)   Anxiety disorder   Chronic diastolic congestive heart failure   COPD (chronic obstructive pulmonary disease)   Elevated troponin   Chest pain   Congestive heart disease   Acute respiratory failure with hypercapnia   CAP (community acquired pneumonia)    Time spent: 40 minutes   [place] [facility] Pager [phone]. If 7PM-7AM, please contact night-coverage at  [email or web address], password [facility] 04/21/2014, 12:20 PM  LOS: 6 days

## 2014-04-21 NOTE — Progress Notes (Signed)
Sheath pulled by two RN's. Procedure explained to patient who verbalized understanding. Manual pressure held for 20 minutes. Hemostasis achieved. Instructed patient to remain straight with leg immobile for 6 hours. Verbalized understanding. Family at bedside. Will monitor closely

## 2014-04-21 NOTE — Evaluation (Signed)
Occupational Therapy Evaluation Patient Details Name: [name] [name] MRN: [id number] DOB: [birth year] Today's Date: 04/21/2014    History of Present Illness 74 yr old with CAD, hypertension, hyperlipidemia, SVT, AAA, COPD, bladder cancer and cerebrovascular disease sent from cardiology office for dyspnea that has worsened over the past 1 week. She has severe dyspnea with any activity and some dyspnea at rest. Pt is on 4L 02 at baseline.  Plan for cardiac cath 2/25.   Clinical Impression   Pt was able to perform self care and light meal prep prior to admission.  She used a cane for ambulation.  Pt primarily sponge bathed due to poor activity tolerance.  Pt had difficulty keeping 02 sats in upper 90s on 4L 02 with activity today. Generalized weakness also interfering with ADL and mobility.  Will follow acutely.    Follow Up Recommendations  Home health OT;Supervision/Assistance - 24 hour    Equipment Recommendations       Recommendations for Other Services       Precautions / Restrictions Precautions Precautions: Fall Precaution Comments: watch sats Restrictions Weight Bearing Restrictions: No      Mobility Bed Mobility Overal bed mobility: Needs Assistance Bed Mobility: Supine to Sit;Sit to Supine     Supine to sit: Min guard Sit to supine: Min guard   General bed mobility comments: use of bedrail  Transfers Overall transfer level: Needs assistance Equipment used: None   Sit to Stand: Min assist         General transfer comment: stood at EOB and took 2 steps to top of bed with hand held assist.    Balance     Sitting balance-Leahy Scale: Fair       Standing balance-Leahy Scale: Poor                              ADL Overall ADL's : Needs assistance/impaired Eating/Feeding: Set up;Sitting   Grooming: Set up;Wash/dry hands;Wash/dry face;Brushing hair;Sitting   Upper Body Bathing: Minimal assitance;Sitting   Lower Body Bathing: Minimal  assistance;Sit to/from stand   Upper Body Dressing : Sitting;Set up   Lower Body Dressing: Minimal assistance;Sit to/from stand               Functional mobility during ADLs:  (unable to tolerate ambulation due to dyspnea) General ADL Comments: Pt primarily limited by poor activity tolerance and difficulty keeping sats up with any activity.     Vision     Perception     Praxis      Pertinent Vitals/Pain Pain Assessment: No/denies pain     Hand Dominance Right   Extremity/Trunk Assessment Upper Extremity Assessment Upper Extremity Assessment: Generalized weakness   Lower Extremity Assessment Lower Extremity Assessment: Generalized weakness       Communication Communication Communication: No difficulties   Cognition Arousal/Alertness: Awake/alert Behavior During Therapy: Anxious Overall Cognitive Status: Within Functional Limits for tasks assessed                     General Comments       Exercises       Shoulder Instructions      Home Living Family/patient expects to be discharged to:: Private residence Living Arrangements: Spouse/significant other Available Help at Discharge: Family;Available 24 hours/day Type of Home: Mobile home Home Access: Stairs to enter Entrance Stairs-Number of Steps: 5 Entrance Stairs-Rails: Right;Left Home Layout: One level     Bathroom Shower/Tub: Tub/shower unit  Bathroom Toilet: Standard     Home Equipment: Environmental consultant - 2 wheels;Walker - 4 wheels;Bedside commode;Wheelchair - manual;Shower seat          Prior Functioning/Environment Level of Independence: Independent with assistive device(s)        Comments: loves cooking, listening to her scanner. Was transferring into the tub for bathing but recently has only sponged bathed due to fatigued. Limited to 30' ambulation due to SOB. Cooks but becomes fatigued. Does not drive    OT Diagnosis: Generalized weakness   OT Problem List: Decreased  strength;Decreased activity tolerance;Impaired balance (sitting and/or standing);Decreased knowledge of use of DME or AE;Cardiopulmonary status limiting activity   OT Treatment/Interventions: Self-care/ADL training;Energy conservation;Therapeutic activities;Patient/family education;Balance training    OT Goals(Current goals can be found in the care plan section) Acute Rehab OT Goals Patient Stated Goal: home OT Goal Formulation: With patient Time For Goal Achievement: 05/05/14 Potential to Achieve Goals: Good ADL Goals Pt Will Perform Grooming: with supervision;sitting Pt Will Perform Upper Body Bathing: with supervision;sitting Pt Will Perform Lower Body Bathing: with supervision;sit to/from stand Pt Will Perform Upper Body Dressing: with supervision;sitting Pt Will Perform Lower Body Dressing: with supervision;sit to/from stand Pt Will Transfer to Toilet: with supervision;ambulating;regular height toilet Pt Will Perform Toileting - Clothing Manipulation and hygiene: with supervision;sit to/from stand Pt Will Perform Tub/Shower Transfer: Tub transfer;with min guard assist;shower seat;ambulating;rolling walker Additional ADL Goal #1: Pt will employ energy conservation strategies during ADL and mobility with supervision.  OT Frequency: Min 2X/week   Barriers to D/C:            Co-evaluation              End of Session Equipment Utilized During Treatment: Oxygen  Activity Tolerance: Patient limited by fatigue Patient left: in bed;with call bell/phone within reach;with family/visitor present   Time: 1210-1236 OT Time Calculation (min): 26 min Charges:  OT General Charges $OT Visit: 1 Procedure OT Evaluation $Initial OT Evaluation Tier I: 1 Procedure OT Treatments $Self Care/Home Management : 8-22 mins G-Codes:    Malka So 04/21/2014, 1:13 PM  [phone]

## 2014-04-21 NOTE — Interval H&P Note (Signed)
History and Physical Interval Note:  04/21/2014 2:03 PM  [name] [name]  has presented today for surgery, with the diagnosis of cad  The various methods of treatment have been discussed with the patient and family. After consideration of risks, benefits and other options for treatment, the patient has consented to  Procedure(s): PERCUTANEOUS CORONARY ROTOBLATOR INTERVENTION (PCI-R) (N/A) as a surgical intervention .  The patient's history has been reviewed, patient examined, no change in status, stable for surgery.  I have reviewed the patient's chart and labs.  Questions were answered to the patient's satisfaction.    Cath Lab Visit (complete for each Cath Lab visit)  Clinical Evaluation Leading to the Procedure:   ACS: Yes.    Non-ACS:    Anginal Classification: CCS IV  Anti-ischemic medical therapy: Minimal Therapy (1 class of medications)  Non-Invasive Test Results: No non-invasive testing performed  Prior CABG: No previous CABG       Sherren Mocha

## 2014-04-21 NOTE — H&P (View-Only) (Signed)
Progress Note  Subjective:    On [place]. Feels breathing is about at baseline. Still dyspneic with any activity. Wore BIPAP last night but mostly on [place] since yesterday. No chest pain.   Objective:   Temp:  [97.6 F (36.4 C)-98.4 F (36.9 C)] 97.8 F (36.6 C) (02/25 0745) Pulse Rate:  [74-90] 81 (02/25 0800) Resp:  [13-21] 18 (02/25 0800) BP: (95-129)/(43-72) 123/64 mmHg (02/25 0800) SpO2:  [90 %-100 %] 93 % (02/25 0800) FiO2 (%):  [40 %] 40 % (02/25 0400) Last BM Date: 04/18/14  Filed Weights   04/17/14 0644 04/18/14 0528 04/19/14 0559  Weight: 181 lb 1.6 oz (82.146 kg) 182 lb 3.2 oz (82.645 kg) 179 lb 0.2 oz (81.2 kg)    Intake/Output Summary (Last 24 hours) at 04/21/14 0855 Last data filed at 04/21/14 5027  Gross per 24 hour  Intake   1040 ml  Output   2970 ml  Net  -1930 ml    Telemetry: Sinus tachycardia  Physical Exam: General: Obese elderly white female, alert. On [place]  HEENT: PERRL, moist mucus membranes.  Neck: Supple, no lymphadenopathy or carotid bruits Lungs: Decreased BS bilaterally Heart: Tachycardia, no murmurs, gallops, or rubs Abdomen: Soft, non-tender, non-distended, BS + Extremities: No cyanosis, clubbing, or edema Neurologic: Alert, oriented Moves all extremities spontaneously.    Lab Results:  Basic Metabolic Panel:  Recent Labs Lab 04/15/14 1700  04/19/14 0505 04/20/14 0310 04/21/14 0240  NA  --   < > 143 140 141  K  --   < > 3.9 3.8 3.4*  CL  --   < > 92* 90* 85*  CO2  --   < > 45* 46* 45*  GLUCOSE  --   < > 131* 125* 158*  BUN  --   < > 15 20 20   CREATININE 0.72  < > 0.57 0.63 0.63  CALCIUM  --   < > 8.5 8.5 8.9  MG 1.7  --   --   --   --   < > = values in this interval not displayed.  Liver Function Tests:  Recent Labs Lab 04/15/14 1700 04/16/14 0535 04/17/14 0430  AST 24 26 22   ALT 25 24 23   ALKPHOS 53 50 44  BILITOT 0.7 0.7 0.4  PROT 6.0 5.7* 5.5*  ALBUMIN 3.7 3.3* 3.2*    CBC:  Recent Labs Lab 04/19/14 0505  04/20/14 0310 04/21/14 0240  WBC 12.4* 11.9* 8.7  HGB 8.6* 8.6* 8.6*  HCT 31.1* 31.8* 30.6*  MCV 84.1 86.6 83.4  PLT 266 271 263    Cardiac Enzymes:  Recent Labs Lab 04/15/14 2307 04/16/14 0535 04/16/14 1358  TROPONINI 0.32* 0.57* 0.35*    BNP:  Recent Labs  11/11/13 1424 12/29/13 1238 04/15/14 1417  PROBNP 671.3* 195.0* 230.0*    Coagulation:  Recent Labs Lab 04/18/14 0554  INR 0.95    Radiology: Dg Chest Port 1 View  04/19/2014   CLINICAL DATA:  Shortness of breath, weakness and respiratory distress for 1 day.  EXAM: PORTABLE CHEST - 1 VIEW  COMPARISON:  04/15/2014  FINDINGS: Stable cardiomegaly without definite edema or CHF. Limited exam because of positioning. Ill-defined increased bibasilar opacities concerning for basilar atelectasis and/or developing airspace disease. Left effusion not excluded. No pneumothorax. Trachea midline. Atherosclerosis of the aorta. Background emphysema suspected.  IMPRESSION: Stable cardiomegaly without CHF  Ill-defined bibasilar atelectasis versus developing airspace disease  Suspect small left effusion.   Electronically Signed   By:  M.  Shick M.D.   On: 04/19/2014 16:12    ECG: Sinus tachycardia, RBBB   Medications:   Scheduled Medications: . amiodarone  200 mg Oral QHS  . arformoterol  15 mcg Nebulization BID  . aspirin EC  81 mg Oral Daily  . azithromycin  500 mg Intravenous Q24H  . budesonide  0.5 mg Nebulization BID  . cefTRIAXone (ROCEPHIN)  IV  1 g Intravenous Q24H  . clopidogrel  75 mg Oral QHS  . diltiazem  300 mg Oral Daily  . enoxaparin (LOVENOX) injection  40 mg Subcutaneous Q24H  . furosemide  40 mg Intravenous BID  . latanoprost  1 drop Both Eyes QHS  . methylPREDNISolone (SOLU-MEDROL) injection  60 mg Intravenous 3 times per day  . nystatin  5 mL Mouth/Throat QID  . pantoprazole  40 mg Oral Daily  . potassium chloride  20 mEq Oral Daily  . pravastatin  80 mg Oral QHS  . sodium chloride  3 mL Intravenous  Q12H  . sodium chloride  3 mL Intravenous Q12H    PRN Medications: sodium chloride, acetaminophen, antiseptic oral rinse, guaiFENesin-dextromethorphan, levalbuterol, ondansetron (ZOFRAN) IV, sodium chloride   Assessment and Plan:  74 y/o F w/ PMHx HTN, HLD, h/o SVT on Amiodarone, AAA, COPD on home O2, and multiple other co-morbidities, admitted on 04/15/14 for SOB and chest pain.   Acute on Chronic Respiratory Failure:  thought to be 2/2 CAP/COPD exacerbation. Started on ABx and steroids. ABG significant for hypercapneic respiratory failure. Able to be weaned to [place] oxygen.  -Continue BiPAP prn per PCCM -Brovana, Pulmicort, Xopenex -Rocephin + Azithro for CAP coverage -Continue Lasix to 40 mg IV bid for now -Solu-Medrol 60 mg IV q8h  NSTEMI: Patient how s/p LHC on 04/18/14 which showed severe CAD involving a chronic total occlusion of the LAD and high-grade proximal lesion of the dominant LCx. Also showed severe PAD (right iliac) and complex AAA. Was supposed to go for PCI of LCx lesion, however, developed respiratory failure as described above.  -Plan complex PCI today. Discussed with Dr. Burt Knack. -Continue ASA + Plavix -Continue Cardizem CD 300 mg qd  H/o SVT on Amiodarone: Currently sinus tachycardia, RBBB.  -Continue Amiodarone 200 mg daily -Cardizem as above  Chronic Combined CHF: ECHO from 04/16/14 showed EF of 40-45%, akinesis of the mid-apicalinferior and apicalanteroseptal myocardium. Grade 1 diastolic dysfunction. Most likely ischemic cardiomyopathy given severe CAD as described above.  -Lasix 40 mg bid for now -Cardizem  I discussed PCI with patient. Reviewed current status with Internal medicine and pulmonary. She is high risk for PCI with her chronic respiratory failure and complex coronary anatomy with large territory of myocardium at risk. She is probably is as good as she is going to be from a pulmonary standpoint and further delay is not likely to see significant  improvement. I discussed with Dr. Burt Knack and will plan on proceeding with PCI today. Will need to be very judicious in use of sedation.   Iline Buchinger [place], [place] 04/21/2014 8:55 AM

## 2014-04-21 NOTE — Progress Notes (Signed)
Progress Note  Subjective:    On [place]. Feels breathing is about at baseline. Still dyspneic with any activity. Wore BIPAP last night but mostly on [place] since yesterday. No chest pain.   Objective:   Temp:  [97.6 F (36.4 C)-98.4 F (36.9 C)] 97.8 F (36.6 C) (02/25 0745) Pulse Rate:  [74-90] 81 (02/25 0800) Resp:  [13-21] 18 (02/25 0800) BP: (95-129)/(43-72) 123/64 mmHg (02/25 0800) SpO2:  [90 %-100 %] 93 % (02/25 0800) FiO2 (%):  [40 %] 40 % (02/25 0400) Last BM Date: 04/18/14  Filed Weights   04/17/14 0644 04/18/14 0528 04/19/14 0559  Weight: 181 lb 1.6 oz (82.146 kg) 182 lb 3.2 oz (82.645 kg) 179 lb 0.2 oz (81.2 kg)    Intake/Output Summary (Last 24 hours) at 04/21/14 0855 Last data filed at 04/21/14 3875  Gross per 24 hour  Intake   1040 ml  Output   2970 ml  Net  -1930 ml    Telemetry: Sinus tachycardia  Physical Exam: General: Obese elderly white female, alert. On [place]  HEENT: PERRL, moist mucus membranes.  Neck: Supple, no lymphadenopathy or carotid bruits Lungs: Decreased BS bilaterally Heart: Tachycardia, no murmurs, gallops, or rubs Abdomen: Soft, non-tender, non-distended, BS + Extremities: No cyanosis, clubbing, or edema Neurologic: Alert, oriented Moves all extremities spontaneously.    Lab Results:  Basic Metabolic Panel:  Recent Labs Lab 04/15/14 1700  04/19/14 0505 04/20/14 0310 04/21/14 0240  NA  --   < > 143 140 141  K  --   < > 3.9 3.8 3.4*  CL  --   < > 92* 90* 85*  CO2  --   < > 45* 46* 45*  GLUCOSE  --   < > 131* 125* 158*  BUN  --   < > 15 20 20   CREATININE 0.72  < > 0.57 0.63 0.63  CALCIUM  --   < > 8.5 8.5 8.9  MG 1.7  --   --   --   --   < > = values in this interval not displayed.  Liver Function Tests:  Recent Labs Lab 04/15/14 1700 04/16/14 0535 04/17/14 0430  AST 24 26 22   ALT 25 24 23   ALKPHOS 53 50 44  BILITOT 0.7 0.7 0.4  PROT 6.0 5.7* 5.5*  ALBUMIN 3.7 3.3* 3.2*    CBC:  Recent Labs Lab 04/19/14 0505  04/20/14 0310 04/21/14 0240  WBC 12.4* 11.9* 8.7  HGB 8.6* 8.6* 8.6*  HCT 31.1* 31.8* 30.6*  MCV 84.1 86.6 83.4  PLT 266 271 263    Cardiac Enzymes:  Recent Labs Lab 04/15/14 2307 04/16/14 0535 04/16/14 1358  TROPONINI 0.32* 0.57* 0.35*    BNP:  Recent Labs  11/11/13 1424 12/29/13 1238 04/15/14 1417  PROBNP 671.3* 195.0* 230.0*    Coagulation:  Recent Labs Lab 04/18/14 0554  INR 0.95    Radiology: Dg Chest Port 1 View  04/19/2014   CLINICAL DATA:  Shortness of breath, weakness and respiratory distress for 1 day.  EXAM: PORTABLE CHEST - 1 VIEW  COMPARISON:  04/15/2014  FINDINGS: Stable cardiomegaly without definite edema or CHF. Limited exam because of positioning. Ill-defined increased bibasilar opacities concerning for basilar atelectasis and/or developing airspace disease. Left effusion not excluded. No pneumothorax. Trachea midline. Atherosclerosis of the aorta. Background emphysema suspected.  IMPRESSION: Stable cardiomegaly without CHF  Ill-defined bibasilar atelectasis versus developing airspace disease  Suspect small left effusion.   Electronically Signed   By:  M.  Shick M.D.   On: 04/19/2014 16:12    ECG: Sinus tachycardia, RBBB   Medications:   Scheduled Medications: . amiodarone  200 mg Oral QHS  . arformoterol  15 mcg Nebulization BID  . aspirin EC  81 mg Oral Daily  . azithromycin  500 mg Intravenous Q24H  . budesonide  0.5 mg Nebulization BID  . cefTRIAXone (ROCEPHIN)  IV  1 g Intravenous Q24H  . clopidogrel  75 mg Oral QHS  . diltiazem  300 mg Oral Daily  . enoxaparin (LOVENOX) injection  40 mg Subcutaneous Q24H  . furosemide  40 mg Intravenous BID  . latanoprost  1 drop Both Eyes QHS  . methylPREDNISolone (SOLU-MEDROL) injection  60 mg Intravenous 3 times per day  . nystatin  5 mL Mouth/Throat QID  . pantoprazole  40 mg Oral Daily  . potassium chloride  20 mEq Oral Daily  . pravastatin  80 mg Oral QHS  . sodium chloride  3 mL Intravenous  Q12H  . sodium chloride  3 mL Intravenous Q12H    PRN Medications: sodium chloride, acetaminophen, antiseptic oral rinse, guaiFENesin-dextromethorphan, levalbuterol, ondansetron (ZOFRAN) IV, sodium chloride   Assessment and Plan:  74 y/o F w/ PMHx HTN, HLD, h/o SVT on Amiodarone, AAA, COPD on home O2, and multiple other co-morbidities, admitted on 04/15/14 for SOB and chest pain.   Acute on Chronic Respiratory Failure:  thought to be 2/2 CAP/COPD exacerbation. Started on ABx and steroids. ABG significant for hypercapneic respiratory failure. Able to be weaned to [place] oxygen.  -Continue BiPAP prn per PCCM -Brovana, Pulmicort, Xopenex -Rocephin + Azithro for CAP coverage -Continue Lasix to 40 mg IV bid for now -Solu-Medrol 60 mg IV q8h  NSTEMI: Patient how s/p LHC on 04/18/14 which showed severe CAD involving a chronic total occlusion of the LAD and high-grade proximal lesion of the dominant LCx. Also showed severe PAD (right iliac) and complex AAA. Was supposed to go for PCI of LCx lesion, however, developed respiratory failure as described above.  -Plan complex PCI today. Discussed with Dr. Burt Knack. -Continue ASA + Plavix -Continue Cardizem CD 300 mg qd  H/o SVT on Amiodarone: Currently sinus tachycardia, RBBB.  -Continue Amiodarone 200 mg daily -Cardizem as above  Chronic Combined CHF: ECHO from 04/16/14 showed EF of 40-45%, akinesis of the mid-apicalinferior and apicalanteroseptal myocardium. Grade 1 diastolic dysfunction. Most likely ischemic cardiomyopathy given severe CAD as described above.  -Lasix 40 mg bid for now -Cardizem  I discussed PCI with patient. Reviewed current status with Internal medicine and pulmonary. She is high risk for PCI with her chronic respiratory failure and complex coronary anatomy with large territory of myocardium at risk. She is probably is as good as she is going to be from a pulmonary standpoint and further delay is not likely to see significant  improvement. I discussed with Dr. Burt Knack and will plan on proceeding with PCI today. Will need to be very judicious in use of sedation.   Donni Oglesby [place], [place] 04/21/2014 8:55 AM

## 2014-04-21 NOTE — CV Procedure (Signed)
    CARDIAC CATH NOTE  Name: [name] [name] MRN: [id number] DOB: Feb 14, 1941  Procedure: Temporary transvenous pacemaker placement, rotational atherectomy/PTCA/stenting of the left circumflex  Indication: This is a chronically ill 74 year old woman with oxygen dependence and chronic hypercarbia. She had a non-ST elevation infarction during this admission. She was found to have severe multivessel disease with a left dominant coronary system, chronic occlusion of the LAD, and heavily calcified severe stenosis of the proximal left circumflex. After discussion of options, we elected to proceed with rotational atherectomy and stenting of the left circumflex is this supplies the majority of her myocardium.  Procedural Details: The right groin was prepped, draped, and anesthetized with 1% lidocaine. Using the modified Seldinger technique, a 7 Fr sheath was introduced into the right femoral artery and a 6 French sheath was placed in the right femoral vein.  A balloontipped transvenous temporary pacemaker was advanced into the RV apex without difficulty. The pacing threshold was less than 1 mA. The pacemaker was set at a backup rate of 60 bpm. The patient has severe peripheral arterial disease with iliac stenosis and infrarenal abdominal aneurysmal disease. This may passage of wires and catheters difficult. Using a JR4 catheter and an angled Glidewire, I was able to navigate the catheter into the thoracic aorta. This was changed out for an exchange length J-wire. The 7 [place] guiding catheter would not advance over this wire. It was then changed out using the same JR4 catheter to an Amplatz Super Stiff wire. I was able to track a 7 [place] XB 3.0 cm guide catheter over this. This successfully engaged the left main coronary artery. Angiography demonstrated 2 large calcified nodules with associated 80-90% stenosis of the proximal left circumflex. Weight-based bivalirudin was given for anticoagulation.  A Rotafloppy  coronary guidewire was used to cross the lesion.  A 1.5 mm rotational atherectomy bur was used. A total of 2 runs were made. The Rotafloppy wire was then changed out for a short cougar wire. The lesion was predilated with a 3.5 mm noncompliant balloon.  The lesion was then stented with a 4.0 x 20 mm Promus drug-eluting stent.  The stent was postdilated with a 4.5 mm noncompliant balloon to 16 atm.  Following PCI, there was 0% residual stenosis and TIMI-3 flow. Final angiography confirmed an excellent result. The patient tolerated the procedure well. There were no immediate procedural complications.  The patient was transferred to the post catheterization recovery area for further monitoring. Her femoral arterial and venous sheaths will be pulled into hours from discontinuation of Angiomax.  Lesion Data: Vessel: LCX/Proximal Percent stenosis (pre): 90 TIMI-flow (pre):  3 Stent:  4.0x20 mm Promus DES Percent stenosis (post): 0 TIMI-flow (post): 3  Estimated Blood Loss: 30 ml  Conclusions: Successful rotational atherectomy and stenting of the left circumflex  Recommendations: DAPT with ASA and plavix x at least 12 months, but favor long-term Rx if tolerated  Sherren Mocha MD, [facility] 04/21/2014, 3:52 PM

## 2014-04-21 NOTE — Progress Notes (Signed)
NUTRITION FOLLOW-UP  INTERVENTION: Encourage PO intake   NUTRITION DIAGNOSIS: Inadequate oral intake related to dislike of hospital food as evidenced by only eating half of her meals; ongoing  Goal: Pt to meet >/= 90% of their estimated nutrition needs; ongoing  Monitor:  PO intake, weight, labs/procedures  ASSESSMENT: 74 yr old with CAD, hypertension, hyperlipidemia, SVT, AAA, COPD, bladder cancer and cerebrovascular disease sent from cardiology office for dyspnea that has worsened over the past 1 week  Labs- low potassium, chloride; high CO2, glucose Pt reported she doesn't enjoy the heart healthy diet. DI helped pt order lunch and dinner to provide food items she likes. PO intake remains at 50%.   Continue to encourage PO.  Height: Ht Readings from Last 1 Encounters:  04/15/14 5\' 5"  (1.651 m)   Weight: Wt Readings from Last 1 Encounters:  04/19/14 179 lb 0.2 oz (81.2 kg)    BMI:  Body mass index is 29.79 kg/(m^2). overweight  Estimated Nutritional Needs: Kcal: 1500-1850 (18-23 kcal/kg) Protein: 90-106 (1.1-1.3 kcal/kg) Fluid: 1.5-1.9 liters  Skin: Dry/flaky with rash present  Diet Order: Heart healthy/CHO Mod  Intake/Output Summary (Last 24 hours) at 04/21/14 1340 Last data filed at 04/21/14 1200  Gross per 24 hour  Intake 1109.17 ml  Output   1050 ml  Net  59.17 ml    Last BM: WDL   Labs:   Recent Labs Lab 04/15/14 1700  04/19/14 0505 04/20/14 0310 04/21/14 0240  NA  --   < > 143 140 141  K  --   < > 3.9 3.8 3.4*  CL  --   < > 92* 90* 85*  CO2  --   < > 45* 46* 45*  BUN  --   < > 15 20 20   CREATININE 0.72  < > 0.57 0.63 0.63  CALCIUM  --   < > 8.5 8.5 8.9  MG 1.7  --   --   --   --   GLUCOSE  --   < > 131* 125* 158*  < > = values in this interval not displayed.  CBG (last 3)  No results for input(s): GLUCAP in the last 72 hours.  Scheduled Meds: . amiodarone  200 mg Oral QHS  . arformoterol  15 mcg Nebulization BID  . aspirin EC  81  mg Oral Daily  . azithromycin  500 mg Intravenous Q24H  . budesonide  0.5 mg Nebulization BID  . cefTRIAXone (ROCEPHIN)  IV  1 g Intravenous Q24H  . clopidogrel  75 mg Oral QHS  . diltiazem  300 mg Oral Daily  . enoxaparin (LOVENOX) injection  40 mg Subcutaneous Q24H  . furosemide  40 mg Intravenous BID  . latanoprost  1 drop Both Eyes QHS  . methylPREDNISolone (SOLU-MEDROL) injection  60 mg Intravenous 3 times per day  . nystatin  5 mL Mouth/Throat QID  . pantoprazole  40 mg Oral Daily  . potassium chloride  20 mEq Oral Daily  . pravastatin  80 mg Oral QHS  . sodium chloride  3 mL Intravenous Q12H  . sodium chloride  3 mL Intravenous Q12H  . sodium chloride  3 mL Intravenous Q12H    Continuous Infusions: . sodium chloride 50 mL/hr at 04/21/14 0937    Wynona Dove, MS Dietetic Intern Pager: [phone]

## 2014-04-22 ENCOUNTER — Encounter (HOSPITAL_COMMUNITY): Payer: Self-pay | Admitting: Cardiovascular Disease

## 2014-04-22 LAB — BASIC METABOLIC PANEL
Anion gap: 8 (ref 5–15)
BUN: 23 mg/dL (ref 6–23)
CALCIUM: 8.5 mg/dL (ref 8.4–10.5)
CHLORIDE: 85 mmol/L — AB (ref 96–112)
CO2: 44 mmol/L (ref 19–32)
CREATININE: 0.62 mg/dL (ref 0.50–1.10)
GFR calc Af Amer: >90 mL/min (ref >90)
GFR, EST NON AFRICAN AMERICAN: 88 mL/min — AB (ref >90)
GLUCOSE: 164 mg/dL — AB (ref 70–99)
POTASSIUM: 3.6 mmol/L (ref 3.5–5.1)
SODIUM: 137 mmol/L (ref 135–145)

## 2014-04-22 LAB — CBC
HEMATOCRIT: 30.5 % — AB (ref 36.0–46.0)
Hemoglobin: 8.5 g/dL — ABNORMAL LOW (ref 12.0–15.0)
MCH: 23.2 pg — AB (ref 26.0–34.0)
MCHC: 27.9 g/dL — ABNORMAL LOW (ref 30.0–36.0)
MCV: 83.3 fL (ref 78.0–100.0)
Platelets: 258 10*3/uL (ref 150–400)
RBC: 3.66 MIL/uL — ABNORMAL LOW (ref 3.87–5.11)
RDW: 16.8 % — AB (ref 11.5–15.5)
WBC: 11.4 10*3/uL — ABNORMAL HIGH (ref 4.0–10.5)

## 2014-04-22 LAB — POCT ACTIVATED CLOTTING TIME: Activated Clotting Time: 336 seconds

## 2014-04-22 MED ORDER — METHYLPREDNISOLONE SODIUM SUCC 125 MG IJ SOLR: 60.0000 mg | Freq: Two times a day (BID) | INTRAMUSCULAR | Status: DC

## 2014-04-22 MED ORDER — PREDNISONE 50 MG PO TABS
60.0000 mg | ORAL_TABLET | Freq: Every day | ORAL | Status: DC
  Administered 2014-04-23: 60 mg via ORAL
  Filled 2014-04-22 (×2): qty 1

## 2014-04-22 MED ORDER — AZITHROMYCIN 500 MG PO TABS
500.0000 mg | ORAL_TABLET | Freq: Every day | ORAL | Status: DC
  Administered 2014-04-23: 500 mg via ORAL
  Filled 2014-04-22: qty 1

## 2014-04-22 MED ORDER — FUROSEMIDE 40 MG PO TABS
40.0000 mg | ORAL_TABLET | Freq: Every day | ORAL | Status: DC
  Administered 2014-04-23: 40 mg via ORAL
  Filled 2014-04-22 (×2): qty 1

## 2014-04-22 MED ORDER — BUDESONIDE 0.5 MG/2ML IN SUSP
0.5000 mg | Freq: Two times a day (BID) | RESPIRATORY_TRACT | Status: DC
  Administered 2014-04-22 – 2014-04-23 (×2): 0.5 mg via RESPIRATORY_TRACT
  Filled 2014-04-22 (×3): qty 2

## 2014-04-22 MED FILL — Sodium Chloride IV Soln 0.9%: INTRAVENOUS | Qty: 50 | Status: AC

## 2014-04-22 NOTE — Progress Notes (Signed)
Physical Therapy Treatment Patient Details Name: [name] [name] MRN: [id number] DOB: 24-Jul-1940 Today's Date: 04/22/2014    History of Present Illness 74 yr old with CAD, hypertension, hyperlipidemia, SVT, AAA, COPD, bladder cancer and cerebrovascular disease sent from cardiology office for dyspnea that has worsened over the past 1 week. She has severe dyspnea with any activity and some dyspnea at rest. Pt is on 4L 02 at baseline.  Plan for cardiac cath 2/25.    PT Comments    Pt was able to perform transfers with min assist. Overall rehab effort is fair to poor and pt declined ambulation, use of AD, and tolerated minimal therapeutic exercise. Pt reports she is limited because of her oxygen, however during session sats were >95% at all times. Will continue to follow, and would like to see pt ambulate next session.  Follow Up Recommendations  Home health PT;Supervision for mobility/OOB     Equipment Recommendations  None recommended by PT    Recommendations for Other Services       Precautions / Restrictions Precautions Precautions: Fall Precaution Comments: watch sats Restrictions Weight Bearing Restrictions: No    Mobility  Bed Mobility Overal bed mobility: Needs Assistance Bed Mobility: Supine to Sit     Supine to sit: Min guard     General bed mobility comments: use of bedrail  Transfers Overall transfer level: Needs assistance Equipment used: None Transfers: Sit to/from [facility] Sit to Stand: Min assist Stand pivot transfers: Min assist       General transfer comment: Pt was able to stand and take pivotal steps around to the chair. Pt not making corrective changes during transfer - PT cueing pt to improve posture and to slow down with transfer. Pt instead leaning over onto the recliner to take steps around to sit, and would not accept an AD for safety.   Ambulation/Gait             General Gait Details: Pt declined.    Stairs            Wheelchair Mobility    Modified Rankin (Stroke Patients Only)       Balance Overall balance assessment: Needs assistance Sitting-balance support: Feet supported;No upper extremity supported Sitting balance-Leahy Scale: Fair     Standing balance support: Bilateral upper extremity supported Standing balance-Leahy Scale: Poor                      Cognition Arousal/Alertness: Awake/alert Behavior During Therapy: Anxious Overall Cognitive Status: Within Functional Limits for tasks assessed                      Exercises General Exercises - Lower Extremity Long Arc Quad: 10 reps    General Comments        Pertinent Vitals/Pain Pain Assessment: Faces Faces Pain Scale: Hurts little more Pain Location: R hip Pain Descriptors / Indicators: Discomfort Pain Intervention(s): Limited activity within patient's tolerance;Monitored during session;Repositioned    Home Living                      Prior Function            PT Goals (current goals can now be found in the care plan section) Acute Rehab PT Goals Patient Stated Goal: home PT Goal Formulation: With patient Time For Goal Achievement: 05/01/14 Potential to Achieve Goals: Good Progress towards PT goals: Not progressing toward goals - comment  Frequency  Min 3X/week    PT Plan Current plan remains appropriate    Co-evaluation             End of Session Equipment Utilized During Treatment: Gait belt;Oxygen Activity Tolerance: Patient limited by fatigue Patient left: in bed;with nursing/sitter in room;with family/visitor present;with call bell/phone within reach     Time: 1030-1046 PT Time Calculation (min) (ACUTE ONLY): 16 min  Charges:  $Therapeutic Activity: 8-22 mins                    G Codes:      Rolinda Roan 05-02-14, 1:08 PM  Rolinda Roan, PT, DPT Acute Rehabilitation Services Pager: [phone]

## 2014-04-22 NOTE — Progress Notes (Signed)
Foley cath removed, tolerated well. Call light within reach, instructed to call if needed bedpan.

## 2014-04-22 NOTE — Progress Notes (Signed)
Transferred to [facility] room 17 by bed, stable, report given to RN. Belongings with pt.

## 2014-04-22 NOTE — Progress Notes (Addendum)
Noted pt unable to walk with PT today. Sounds like they will attempt ambulation again. Pt n/a for walking with CR at this time. Discussed with pt and husband Plavix, Asa, diet, stent, NTG. Voiced understanding. Will await progression with PT before ambulating. Pt not appropriate for CRPII. May be appropriate for Pulmonary Rehab down the road.  Husband does not feel pt is ready to d/c tomorrow. [place], ACSM 2:29 PM 04/22/2014

## 2014-04-22 NOTE — Progress Notes (Signed)
Progress Note  Subjective:    Still SOB, on [place]. No chest pain or palpitations.   Objective:   Temp:  [97.6 F (36.4 C)-98.7 F (37.1 C)] 98.7 F (37.1 C) (02/26 0936) Pulse Rate:  [75-95] 95 (02/26 0936) Resp:  [13-28] 24 (02/26 0936) BP: (98-143)/(37-85) 143/65 mmHg (02/26 0936) SpO2:  [90 %-100 %] 98 % (02/26 0936) FiO2 (%):  [40 %] 40 % (02/26 0400) Last BM Date: 04/18/14  Filed Weights   04/17/14 0644 04/18/14 0528 04/19/14 0559  Weight: 181 lb 1.6 oz (82.146 kg) 182 lb 3.2 oz (82.645 kg) 179 lb 0.2 oz (81.2 kg)    Intake/Output Summary (Last 24 hours) at 04/22/14 1001 Last data filed at 04/22/14 0900  Gross per 24 hour  Intake 859.17 ml  Output   1650 ml  Net -790.83 ml    Telemetry: NSR/Sinus tachycardia  Physical Exam: General: Obese elderly white female, alert. On [place]  HEENT: PERRL, moist mucus membranes.  Neck: Supple, no lymphadenopathy or carotid bruits Lungs: Decreased BS bilaterally Heart: Tachycardia, no murmurs, gallops, or rubs Abdomen: Soft, non-tender, non-distended, BS + Extremities: No cyanosis, clubbing, or edema Neurologic: Alert, oriented moves all extremities spontaneously.    Lab Results:  Basic Metabolic Panel:  Recent Labs Lab 04/15/14 1700  04/20/14 0310 04/21/14 0240 04/21/14 1630 04/22/14 0333  NA  --   < > 140 141  --  137  K  --   < > 3.8 3.4*  --  3.6  CL  --   < > 90* 85*  --  85*  CO2  --   < > 46* 45*  --  44*  GLUCOSE  --   < > 125* 158*  --  164*  BUN  --   < > 20 20  --  23  CREATININE 0.72  < > 0.63 0.63 0.62 0.62  CALCIUM  --   < > 8.5 8.9  --  8.5  MG 1.7  --   --   --   --   --   < > = values in this interval not displayed.  Liver Function Tests:  Recent Labs Lab 04/15/14 1700 04/16/14 0535 04/17/14 0430  AST 24 26 22   ALT 25 24 23   ALKPHOS 53 50 44  BILITOT 0.7 0.7 0.4  PROT 6.0 5.7* 5.5*  ALBUMIN 3.7 3.3* 3.2*    CBC:  Recent Labs Lab 04/21/14 0240 04/21/14 1630 04/22/14 0333  WBC  8.7 13.5* 11.4*  HGB 8.6* 8.6* 8.5*  HCT 30.6* 30.8* 30.5*  MCV 83.4 83.2 83.3  PLT 263 292 258    Cardiac Enzymes:  Recent Labs Lab 04/15/14 2307 04/16/14 0535 04/16/14 1358  TROPONINI 0.32* 0.57* 0.35*    BNP:  Recent Labs  11/11/13 1424 12/29/13 1238 04/15/14 1417  PROBNP 671.3* 195.0* 230.0*    Coagulation:  Recent Labs Lab 04/18/14 0554  INR 0.95    ECG: Sinus tachycardia, RBBB   Medications:   Scheduled Medications: . amiodarone  200 mg Oral QHS  . arformoterol  15 mcg Nebulization BID  . aspirin EC  81 mg Oral Daily  . azithromycin  500 mg Intravenous Q24H  . budesonide  0.5 mg Nebulization BID  . cefTRIAXone (ROCEPHIN)  IV  1 g Intravenous Q24H  . clopidogrel  75 mg Oral QHS  . diltiazem  300 mg Oral Daily  . enoxaparin (LOVENOX) injection  40 mg Subcutaneous Q24H  . furosemide  40 mg  Intravenous BID  . latanoprost  1 drop Both Eyes QHS  . methylPREDNISolone (SOLU-MEDROL) injection  60 mg Intravenous Q12H  . nystatin  5 mL Mouth/Throat QID  . pantoprazole  40 mg Oral Daily  . potassium chloride  20 mEq Oral Daily  . pravastatin  80 mg Oral QHS  . sodium chloride  3 mL Intravenous Q12H  . sodium chloride  3 mL Intravenous Q12H  . sodium chloride  3 mL Intravenous Q12H    PRN Medications: sodium chloride, sodium chloride, acetaminophen, antiseptic oral rinse, guaiFENesin-dextromethorphan, levalbuterol, morphine injection, ondansetron (ZOFRAN) IV, oxyCODONE-acetaminophen, sodium chloride, sodium chloride   Assessment and Plan:  74 y/o F w/ PMHx HTN, HLD, h/o SVT on Amiodarone, AAA, COPD on home O2, and multiple other co-morbidities, admitted on 04/15/14 for SOB and chest pain.   NSTEMI: LHC on 04/18/14 showed severe CAD involving a chronic total occlusion of the LAD and high-grade proximal lesion of the dominant LCx. Also showed severe PAD (right iliac) and complex AAA. Went for PCI yesterday, received successful rotational atherectomy and DES to  LCx. -Continue ASA + Plavix -Continue Cardizem CD 300 mg qd  Acute on Chronic Respiratory Failure:  thought to be 2/2 CAP/COPD exacerbation. Started on ABx and steroids. ABG significant for hypercapneic respiratory failure. Able to be weaned to [place] oxygen.  -Management per PCCM -Brovana, Pulmicort, Xopenex -Rocephin + Azithro for CAP coverage -Decrease Lasix to 40 mg po daily -Solu-Medrol 60 mg IV q12h  H/o SVT on Amiodarone: Currently NSR/sinus tachycardia, RBBB.  -Continue Amiodarone 200 mg daily -Cardizem as above  Chronic Combined CHF: ECHO from 04/16/14 showed EF of 40-45%, akinesis of the mid-apicalinferior and apicalanteroseptal myocardium. Grade 1 diastolic dysfunction. Most likely ischemic cardiomyopathy given severe CAD as described above.  -Lasix 40 mg po qd as above -Cardizem  Signed: Luanne Bras, MD 04/22/2014 10:06 AM   Patient seen and examined and history reviewed. Agree with above findings and plan. Doing well from a cardiac standpoint post PCI. No chest pain. Groin shows some bruising but is soft without bruit or hematoma. Hgb is stable. She is in NSR. She has diuresed well and is negative 4 liters. Will transition to po lasix today. Continue DAPT with ASA and Plavix. She will eventually need vascular surgery evaluation of her AAA but this can be done as outpatient.   Peter Martinique, [place] 04/22/2014 10:32 AM

## 2014-04-22 NOTE — Progress Notes (Signed)
[facility] PROGRESS NOTE  [name] [name] CWC:[id number] DOB: 03-10-1940 DOA: 04/15/2014 PCP: Sherrie Mustache, MD  Assessment/Plan: Active Problems:   Essential hypertension   Coronary atherosclerosis   Acute-on-chronic respiratory failure   Acute exacerbation of chronic obstructive pulmonary disease (COPD)   Anxiety disorder   Chronic diastolic congestive heart failure   COPD (chronic obstructive pulmonary disease)   Elevated troponin   Chest pain   Congestive heart disease   Acute respiratory failure with hypercapnia   CAP (community acquired pneumonia)    Acute on chronic hypoxic /hypercapnic  respiratory failure (on 4L/O2 at home), likely multifactorial d/t COPD/CHF Multifactorial , CHF/COPD exacerbation Transfer back to telemetry today Monitor the patient off BiPAP, she has chronic hypercapnic respiratory failure , continue to use BiPAP daily at bedtime, repeat ABG in the morning Continue medications for acute on chronic diastolic congestive heart failure and COPD exacerbation Chest x-ray suspicious for developing airspace disease continue Rocephin, azithromycin D-dimer negative,, troponin abnl 2-D echo  Findings as below Decrease Lasix to 40 mg by mouth daily Switched to by mouth prednisone extremely milligrams a day Brovana, Pulmicort, Xopenex    Essential hypertension-continue diltiazem,  Lasix  Anemia without patient-reported bleeding Hemoglobin 8.6, stable 3 days   Acute-on-chronic respiratory failure , oxygen dependent at home on 4 L Continue BiPAP, repeat ABG Hypercapnia exacerbated by klonopin and tylenol with codeine , avoid these medications   Peripheral vascular disease known AAA, f/u duplex was due 07/2014 but cath shows "large complex AAA with severe distal aortic disease and probable high-grade ostial disease in bilateral iliacs" Patient will need outpatient vascular follow-up   Hypokalemia Replete potassium  History of CVA Continue  aspirin and Plavix   Coronary artery disease Continue with aspirin and Plavix, and cycle cardiac enzymes Troponin trending up S/p cath 2/22 cath 04/18/14: severe 2V calcific CAD with totally occluded LAD and high grade prox disease in dominant LCx that collateralizes her LAD - felt to be poor candidate for CABG; plan high risk PCI of LCx with possible rotational atherectomy today PCI 2/25, received successful rotational atherectomy and DES to LCx - continue aspirin, statin, Plavix (likely cannot tolerate b-blocker due to severe COPD).  -- 2D ECHO with new low EF and WMA  Anticipate she can discharge tomorrow if stable from a pulmonary standpoint   Gastroesophageal reflux Continue with Protonix  Atrial fibrillation continue with amiodarone, Cardizem  Anxiety disorder-continue with Klonopin, minimal dose if needed     Code Status: full Family Communication: patient's husband is by the bedside Disposition Plan: Transfer to telemetry    Brief narrative: HPI:  74 yr old with CAD, hypertension, hyperlipidemia, SVT, AAA, COPD, bladder cancer and cerebrovascular disease sent from cardiology office for dyspnea that has worsened over the past 1 week. She has severe dyspnea with any activity and some dyspnea at rest. No fevers, chills or productive cough. No hemoptysis. She denies orthopnea or PND but she has had increased weight. She saw Dr. Stanford Breed her cardiologist today who increased her Lasix and recommended admission for acute on chronic heart failure however patient refused. When she was outside the medical building she did develop some chest pain and increased shortness of breath and EMS was called. She was brought here to the emergency room. Upon arrival pt was pale with sats of 91% on 4 L, tachypneic in 30's with pressure of 90 sbp. EKG showed RBB. EMS gave 5 mg albuterol . She had about a 20-30 minutes some tightness in the center  of her chest. She denies any chest pain  currently. She still feels short of breath. She was given one nebulizer treatment around and feels a little bit better. She's on baseline oxygen at 4 L/m. She has had some increased cough but no change in color of her sputum. She denies any known fevers. She denies any nausea or vomiting ER workup : chest x-ray does not show edema. Her BNP is mildly elevated. There is no evidence of pneumonia. She did have some chest pain but her troponin is negative. Her EKG shows a right bundle branch block which is new from her past EKG but no ST elevation is noted. She's been given another nebulizer treatment here in the ED as well as Solu-Medrol. She's feeling better ,admitted for CHF ,COPD exacerbation.   Consultants:  Cardiology  Procedures:  None  Antibiotics: None  HPI/Subjective: Patient sitting in the chair, breathing is stable, no chest pain or palpitations  Objective: Filed Vitals:   04/22/14 0400 04/22/14 0912 04/22/14 0936 04/22/14 1130  BP: 103/59  143/65 139/72  Pulse: 75  95 85  Temp: 98 F (36.7 C)  98.7 F (37.1 C) 98.5 F (36.9 C)  TempSrc: Oral  Oral Oral  Resp: 13  24 25   Height:      Weight:      SpO2: 100% 93% 98% 100%    Intake/Output Summary (Last 24 hours) at 04/22/14 1226 Last data filed at 04/22/14 0900  Gross per 24 hour  Intake    690 ml  Output   1650 ml  Net   -960 ml    Exam:  General: Obese elderly white female, alert. On [place]  HEENT: PERRL, moist mucus membranes.  Neck: Supple, no lymphadenopathy or carotid bruits Lungs: Decreased BS bilaterally Heart: Tachycardia, no murmurs, gallops, or rubs Abdomen: Soft, non-tender, non-distended, BS + Extremities: No cyanosis, clubbing, or edema Neurologic: Alert, oriented moves all extremities spontaneously   Data Reviewed: Basic Metabolic Panel:  Recent Labs Lab 04/15/14 1700  04/17/14 0430 04/18/14 0932  04/19/14 0505 04/20/14 0310 04/21/14 0240 04/21/14 1630 04/22/14 0333  NA  --   < > 139   --   --  143 140 141  --  137  K  --   < > 3.4* 3.5  --  3.9 3.8 3.4*  --  3.6  CL  --   < > 93*  --   --  92* 90* 85*  --  85*  CO2  --   < > 38*  --   --  45* 46* 45*  --  44*  GLUCOSE  --   < > 143*  --   --  131* 125* 158*  --  164*  BUN  --   < > 16  --   --  15 20 20   --  23  CREATININE 0.72  < > 0.72  --   < > 0.57 0.63 0.63 0.62 0.62  CALCIUM  --   < > 8.8  --   --  8.5 8.5 8.9  --  8.5  MG 1.7  --   --   --   --   --   --   --   --   --   < > = values in this interval not displayed.  Liver Function Tests:  Recent Labs Lab 04/15/14 1417 04/15/14 1700 04/16/14 0535 04/17/14 0430  AST 19 24 26 22   ALT 22 25 24  23  ALKPHOS 55 53 50 44  BILITOT 0.5 0.7 0.7 0.4  PROT 6.6 6.0 5.7* 5.5*  ALBUMIN 3.9 3.7 3.3* 3.2*   No results for input(s): LIPASE, AMYLASE in the last 168 hours. No results for input(s): AMMONIA in the last 168 hours.  CBC:  Recent Labs Lab 04/15/14 1417 04/15/14 1423  04/19/14 0505 04/20/14 0310 04/21/14 0240 04/21/14 1630 04/22/14 0333  WBC 17.8* 16.5*  < > 12.4* 11.9* 8.7 13.5* 11.4*  NEUTROABS 14.9* 13.2*  --   --   --   --   --   --   HGB 10.0* 10.2*  < > 8.6* 8.6* 8.6* 8.6* 8.5*  HCT 31.8* 36.4  < > 31.1* 31.8* 30.6* 30.8* 30.5*  MCV 79.2 85.8  < > 84.1 86.6 83.4 83.2 83.3  PLT 348.0 317  < > 266 271 263 292 258  < > = values in this interval not displayed.  Cardiac Enzymes:  Recent Labs Lab 04/15/14 1700 04/15/14 2307 04/16/14 0535 04/16/14 1358  TROPONINI <0.03 0.32* 0.57* 0.35*   BNP (last 3 results)  Recent Labs  04/15/14 1423  BNP 229.9*    ProBNP (last 3 results)  Recent Labs  11/11/13 1424 12/29/13 1238 04/15/14 1417  PROBNP 671.3* 195.0* 230.0*      CBG: No results for input(s): GLUCAP in the last 168 hours.  Recent Results (from the past 240 hour(s))  MRSA PCR Screening     Status: None   Collection Time: 04/19/14 11:29 AM  Result Value Ref Range Status   MRSA by PCR NEGATIVE NEGATIVE Final     Comment:        The GeneXpert MRSA Assay (FDA approved for NASAL specimens only), is one component of a comprehensive MRSA colonization surveillance program. It is not intended to diagnose MRSA infection nor to guide or monitor treatment for MRSA infections.      Studies: Dg Chest 1 View  04/15/2014   CLINICAL DATA:  74 year old female with a history of shortness of breath, hypertension  EXAM: CHEST  1 VIEW  COMPARISON:  11/11/2013  FINDINGS: Cardiomediastinal silhouette unchanged in size and contour with cardiomegaly.  Atherosclerotic calcifications of the aortic arch.  No evidence of pulmonary vascular congestion.  No pneumothorax.  Persistent opacity at the base of the right lung, present on comparison plain film studies.  Retrocardiac region not well evaluated.  No definite confluent airspace disease.  No displaced fracture.  IMPRESSION: No evidence of pulmonary edema, with stable cardiomegaly and atherosclerotic changes.  Retrocardiac region not well visualized, and if there is were further concern for acute process, a dedicated PA and lateral chest x-ray would be useful.  Persistent opacity at the right base, likely reflective of atelectasis and/ or scarring. If there were a need to further characterize the finding a CT would be required.  Signed,  Dulcy Fanny. Earleen Newport, DO  [facility]  [facility]   Electronically Signed   By: Corrie Mckusick D.O.   On: 04/15/2014 17:09   Dg Chest Port 1 View  04/19/2014   CLINICAL DATA:  Shortness of breath, weakness and respiratory distress for 1 day.  EXAM: PORTABLE CHEST - 1 VIEW  COMPARISON:  04/15/2014  FINDINGS: Stable cardiomegaly without definite edema or CHF. Limited exam because of positioning. Ill-defined increased bibasilar opacities concerning for basilar atelectasis and/or developing airspace disease. Left effusion not excluded. No pneumothorax. Trachea midline. Atherosclerosis of the aorta. Background  emphysema suspected.  IMPRESSION: Stable cardiomegaly without  CHF  Ill-defined bibasilar atelectasis versus developing airspace disease  Suspect small left effusion.   Electronically Signed   By: Jerilynn Mages.  Shick M.D.   On: 04/19/2014 16:12   Dg Chest Port 1 View  04/15/2014   CLINICAL DATA:  Chest pain and difficulty breathing acute onset  EXAM: PORTABLE CHEST - 1 VIEW  COMPARISON:  Study obtained earlier in the day; November 11, 2013  FINDINGS: There is underlying emphysematous change. There is chronic pleural thickening on the right which appears stable. There is no frank edema or consolidation. Heart is enlarged with pulmonary vascularity within normal limits. There is atherosclerotic change in the aorta. No adenopathy. No bone lesions.  IMPRESSION: Stable cardiomegaly. Chronic pleural thickening on the right. Underlying emphysema. No frank edema or consolidation.   Electronically Signed   By: Lowella Grip III M.D.   On: 04/15/2014 14:49    Scheduled Meds: . amiodarone  200 mg Oral QHS  . arformoterol  15 mcg Nebulization BID  . aspirin EC  81 mg Oral Daily  . azithromycin  500 mg Intravenous Q24H  . budesonide  0.5 mg Nebulization BID  . cefTRIAXone (ROCEPHIN)  IV  1 g Intravenous Q24H  . clopidogrel  75 mg Oral QHS  . diltiazem  300 mg Oral Daily  . enoxaparin (LOVENOX) injection  40 mg Subcutaneous Q24H  . [START ON 04/23/2014] furosemide  40 mg Oral Q breakfast  . latanoprost  1 drop Both Eyes QHS  . methylPREDNISolone (SOLU-MEDROL) injection  60 mg Intravenous Q12H  . nystatin  5 mL Mouth/Throat QID  . pantoprazole  40 mg Oral Daily  . potassium chloride  20 mEq Oral Daily  . pravastatin  80 mg Oral QHS  . sodium chloride  3 mL Intravenous Q12H  . sodium chloride  3 mL Intravenous Q12H  . sodium chloride  3 mL Intravenous Q12H   Continuous Infusions:    Active Problems:   Essential hypertension   Coronary atherosclerosis   Acute-on-chronic respiratory failure   Acute  exacerbation of chronic obstructive pulmonary disease (COPD)   Anxiety disorder   Chronic diastolic congestive heart failure   COPD (chronic obstructive pulmonary disease)   Elevated troponin   Chest pain   Congestive heart disease   Acute respiratory failure with hypercapnia   CAP (community acquired pneumonia)    Time spent: 40 minutes   [facility] Pager [phone]. If 7PM-7AM, please contact night-coverage at [email or web address], password [facility] 04/22/2014, 12:26 PM  LOS: 7 days

## 2014-04-22 NOTE — Progress Notes (Signed)
Pt is needy, requires much time to attend to her needs.

## 2014-04-22 NOTE — Progress Notes (Signed)
Patient refuses BIPAP at this time, RN aware. RT will continue to monitor.

## 2014-04-23 LAB — COMPREHENSIVE METABOLIC PANEL
ALT: 34 U/L (ref 0–35)
AST: 29 U/L (ref 0–37)
Albumin: 3.2 g/dL — ABNORMAL LOW (ref 3.5–5.2)
Alkaline Phosphatase: 37 U/L — ABNORMAL LOW (ref 39–117)
Anion gap: 9 (ref 5–15)
BUN: 21 mg/dL (ref 6–23)
CALCIUM: 8.5 mg/dL (ref 8.4–10.5)
CO2: 44 mmol/L — AB (ref 19–32)
Chloride: 85 mmol/L — ABNORMAL LOW (ref 96–112)
Creatinine, Ser: 0.59 mg/dL (ref 0.50–1.10)
GFR calc Af Amer: >90 mL/min (ref >90)
GFR calc non Af Amer: 89 mL/min — ABNORMAL LOW (ref >90)
Glucose, Bld: 109 mg/dL — ABNORMAL HIGH (ref 70–99)
Potassium: 3.8 mmol/L (ref 3.5–5.1)
Sodium: 138 mmol/L (ref 135–145)
TOTAL PROTEIN: 5 g/dL — AB (ref 6.0–8.3)
Total Bilirubin: 0.6 mg/dL (ref 0.3–1.2)

## 2014-04-23 LAB — BLOOD GAS, ARTERIAL
Acid-Base Excess: 18.8 mmol/L — ABNORMAL HIGH (ref 0.0–2.0)
Bicarbonate: 44.1 mEq/L — ABNORMAL HIGH (ref 20.0–24.0)
Drawn by: 41308
O2 Content: 4 L/min
O2 SAT: 92 %
PO2 ART: 64.7 mmHg — AB (ref 80.0–100.0)
Patient temperature: 98.6
TCO2: 45.9 mmol/L (ref 0–100)
pCO2 arterial: 60.9 mmHg (ref 35.0–45.0)
pH, Arterial: 7.473 — ABNORMAL HIGH (ref 7.350–7.450)

## 2014-04-23 MED ORDER — ARFORMOTEROL TARTRATE 15 MCG/2ML IN NEBU: 15.0000 ug | INHALATION_SOLUTION | Freq: Two times a day (BID) | RESPIRATORY_TRACT | Status: DC

## 2014-04-23 MED ORDER — PREDNISONE 1 MG PO TABS: ORAL_TABLET | ORAL | Status: DC

## 2014-04-23 MED ORDER — ACETAMINOPHEN 325 MG PO TABS: 650.0000 mg | ORAL_TABLET | ORAL | Status: DC | PRN

## 2014-04-23 MED ORDER — AZITHROMYCIN 500 MG PO TABS: 500.0000 mg | ORAL_TABLET | Freq: Every day | ORAL | Status: DC

## 2014-04-23 MED ORDER — DEXTROMETHORPHAN-GUAIFENESIN 5-100 MG/5ML PO LIQD: 5.0000 mL | Freq: Two times a day (BID) | ORAL | Status: DC | PRN

## 2014-04-23 NOTE — Discharge Summary (Addendum)
Physician Discharge Summary  [name] [name] MRN: [id number] DOB/AGE: 06/23/1940 74 y.o.  PCP: Sherrie Mustache, MD   Admit date: 04/15/2014 Discharge date: 04/23/2014  Discharge Diagnoses:   rotational atherectomy and DES to LCx.   Essential hypertension   Coronary atherosclerosis   Acute-on-chronic respiratory failure   Acute exacerbation of chronic obstructive pulmonary disease (COPD)   Anxiety disorder   Chronic diastolic congestive heart failure   COPD (chronic obstructive pulmonary disease)   Elevated troponin   Chest pain   Congestive heart disease   Acute respiratory failure with hypercapnia   CAP (community acquired pneumonia)  Follow-up recommendations Follow-up with Dr. Stanford Breed Follow-up with PCP in 5-7 days Follow-up CBC, CMP in 1 week    Medication List    STOP taking these medications        acetaminophen-codeine 300-30 MG per tablet  Commonly known as:  TYLENOL #3     clonazePAM 0.5 MG tablet  Commonly known as:  KLONOPIN      TAKE these medications        acetaminophen 325 MG tablet  Commonly known as:  TYLENOL  Take 2 tablets (650 mg total) by mouth every 4 (four) hours as needed for headache or mild pain.     amiodarone 200 MG tablet  Commonly known as:  PACERONE  Take 200 mg by mouth at bedtime.     arformoterol 15 MCG/2ML Nebu  Commonly known as:  BROVANA  Take 2 mLs (15 mcg total) by nebulization 2 (two) times daily.     aspirin EC 81 MG tablet  Take 81 mg by mouth at bedtime.     azithromycin 500 MG tablet  Commonly known as:  ZITHROMAX  Take 1 tablet (500 mg total) by mouth daily.     budesonide 0.25 MG/2ML nebulizer solution  Commonly known as:  PULMICORT  2 ml twice daily with perforomist     clopidogrel 75 MG tablet  Commonly known as:  PLAVIX  Take 1 tablet (75 mg total) by mouth at bedtime.     Dextromethorphan-Guaifenesin 5-100 MG/5ML Liqd  Commonly known as:  MUCINEX FAST-MAX DM MAX  Take 5 mLs by mouth every  12 (twelve) hours as needed (for congestion).     diltiazem 300 MG 24 hr capsule  Commonly known as:  CARDIZEM CD  TAKE 1 CAPSULE IN THE EVENING.     furosemide 40 MG tablet  Commonly known as:  LASIX  Take 1 tablet (40 mg total) by mouth daily.     ipratropium 0.02 % nebulizer solution  Commonly known as:  ATROVENT  Take 0.5 mg by nebulization 4 (four) times daily.     ipratropium 0.02 % nebulizer solution  Commonly known as:  ATROVENT  USE 1 VIAL IN NEBULIZER EVERY 6 HOURS.     levalbuterol 45 MCG/ACT inhaler  Commonly known as:  XOPENEX HFA  Inhale 2 puffs into the lungs every 4 (four) hours as needed for wheezing.     nitroGLYCERIN 0.4 MG SL tablet  Commonly known as:  NITROSTAT  Place 0.4 mg under the tongue every 5 (five) minutes as needed for chest pain (may repeat x3). Chest pain     nystatin 100000 UNIT/ML suspension  Commonly known as:  MYCOSTATIN  TAKE 1 TEASPOONFUL FOUR TIMES DAILY AS DIRECTED.     pantoprazole 40 MG tablet  Commonly known as:  PROTONIX  Take 40 mg by mouth daily.     pantoprazole 40 MG tablet  Commonly known as:  PROTONIX  TAKE 1 TABLET BY MOUTH ONCE DAILY 30-60 MINTUES BEFORE FIRST MEAL OF THE DAY.     potassium chloride SA 20 MEQ tablet  Commonly known as:  K-DUR,KLOR-CON  Take 40 mEq by mouth daily.     pravastatin 80 MG tablet  Commonly known as:  PRAVACHOL  Take 80 mg by mouth at bedtime.     predniSONE 1 MG tablet  Commonly known as:  DELTASONE  - 6 tablets for 3 days  - 5 tablets for 3 days  - 4 tablets for 3 days  - 3 tablets for 3 days  - 2 tablets for 3 days  - 1 tablet for 3 days then discontinue     promethazine 25 MG tablet  Commonly known as:  PHENERGAN  Take 25 mg by mouth every 4 (four) hours as needed for nausea.     ranitidine 150 MG capsule  Commonly known as:  ZANTAC  Take 150 mg by mouth at bedtime.     Travoprost (BAK Free) 0.004 % Soln ophthalmic solution  Commonly known as:  TRAVATAN  Place 1  drop into both eyes at bedtime.        Discharge Condition:  Disposition: 01-Home or Self Care   Consults:  Cardiology Pulmonary  Significant Diagnostic Studies: Dg Chest 1 View  04/15/2014   CLINICAL DATA:  74 year old female with a history of shortness of breath, hypertension  EXAM: CHEST  1 VIEW  COMPARISON:  11/11/2013  FINDINGS: Cardiomediastinal silhouette unchanged in size and contour with cardiomegaly.  Atherosclerotic calcifications of the aortic arch.  No evidence of pulmonary vascular congestion.  No pneumothorax.  Persistent opacity at the base of the right lung, present on comparison plain film studies.  Retrocardiac region not well evaluated.  No definite confluent airspace disease.  No displaced fracture.  IMPRESSION: No evidence of pulmonary edema, with stable cardiomegaly and atherosclerotic changes.  Retrocardiac region not well visualized, and if there is were further concern for acute process, a dedicated PA and lateral chest x-ray would be useful.  Persistent opacity at the right base, likely reflective of atelectasis and/ or scarring. If there were a need to further characterize the finding a CT would be required.  Signed,  Dulcy Fanny. Earleen Newport, DO  [facility]  [facility]   Electronically Signed   By: Corrie Mckusick D.O.   On: 04/15/2014 17:09   Dg Chest Port 1 View  04/19/2014   CLINICAL DATA:  Shortness of breath, weakness and respiratory distress for 1 day.  EXAM: PORTABLE CHEST - 1 VIEW  COMPARISON:  04/15/2014  FINDINGS: Stable cardiomegaly without definite edema or CHF. Limited exam because of positioning. Ill-defined increased bibasilar opacities concerning for basilar atelectasis and/or developing airspace disease. Left effusion not excluded. No pneumothorax. Trachea midline. Atherosclerosis of the aorta. Background emphysema suspected.  IMPRESSION: Stable cardiomegaly without CHF  Ill-defined bibasilar atelectasis versus  developing airspace disease  Suspect small left effusion.   Electronically Signed   By: Jerilynn Mages.  Shick M.D.   On: 04/19/2014 16:12   Dg Chest Port 1 View  04/15/2014   CLINICAL DATA:  Chest pain and difficulty breathing acute onset  EXAM: PORTABLE CHEST - 1 VIEW  COMPARISON:  Study obtained earlier in the day; November 11, 2013  FINDINGS: There is underlying emphysematous change. There is chronic pleural thickening on the right which appears stable. There is no frank edema or consolidation. Heart is enlarged with pulmonary  vascularity within normal limits. There is atherosclerotic change in the aorta. No adenopathy. No bone lesions.  IMPRESSION: Stable cardiomegaly. Chronic pleural thickening on the right. Underlying emphysema. No frank edema or consolidation.   Electronically Signed   By: Lowella Grip III M.D.   On: 04/15/2014 14:49    Cardiac cath 2/22 Assessment: 1. Severe 2-V calcific CAD as described above with totally occluded LAD and high grade proximal disease in dominant LCX 2. Severe PAD with complex AAA as described above   Plan/Discussion:  She has severe CAD and is a poor candidate for CABG. She has a high-grade proximal lesion in a dominant LCX that collateralizes her LAD. Will hydrate and plan high-risk PCI of LCX with possible rotational atherectomy . Will need VVS f/u for PAD.   Successful rotational atherectomy and stenting of the left circumflex   Microbiology: Recent Results (from the past 240 hour(s))  MRSA PCR Screening     Status: None   Collection Time: 04/19/14 11:29 AM  Result Value Ref Range Status   MRSA by PCR NEGATIVE NEGATIVE Final    Comment:        The GeneXpert MRSA Assay (FDA approved for NASAL specimens only), is one component of a comprehensive MRSA colonization surveillance program. It is not intended to diagnose MRSA infection nor to guide or monitor treatment for MRSA infections.      Labs: Results for orders placed or performed during  the hospital encounter of 04/15/14 (from the past 48 hour(s))  CBC     Status: Abnormal   Collection Time: 04/21/14  4:30 PM  Result Value Ref Range   WBC 13.5 (H) 4.0 - 10.5 K/uL   RBC 3.70 (L) 3.87 - 5.11 MIL/uL   Hemoglobin 8.6 (L) 12.0 - 15.0 g/dL   HCT 30.8 (L) 36.0 - 46.0 %   MCV 83.2 78.0 - 100.0 fL   MCH 23.2 (L) 26.0 - 34.0 pg   MCHC 27.9 (L) 30.0 - 36.0 g/dL   RDW 16.8 (H) 11.5 - 15.5 %   Platelets 292 150 - 400 K/uL  Creatinine, serum     Status: Abnormal   Collection Time: 04/21/14  4:30 PM  Result Value Ref Range   Creatinine, Ser 0.62 0.50 - 1.10 mg/dL   GFR calc non Af Amer 88 (L) >90 mL/min   GFR calc Af Amer >90 >90 mL/min    Comment: (NOTE) The eGFR has been calculated using the CKD EPI equation. This calculation has not been validated in all clinical situations. eGFR's persistently <90 mL/min signify possible Chronic Kidney Disease.   Basic metabolic panel     Status: Abnormal   Collection Time: 04/22/14  3:33 AM  Result Value Ref Range   Sodium 137 135 - 145 mmol/L   Potassium 3.6 3.5 - 5.1 mmol/L   Chloride 85 (L) 96 - 112 mmol/L   CO2 44 (HH) 19 - 32 mmol/L    Comment: CRITICAL RESULT CALLED TO, READ BACK BY AND VERIFIED WITH: [facility] RN 04/22/2014 0502 JORDANS REPEATED TO VERIFY    Glucose, Bld 164 (H) 70 - 99 mg/dL   BUN 23 6 - 23 mg/dL   Creatinine, Ser 0.62 0.50 - 1.10 mg/dL   Calcium 8.5 8.4 - 10.5 mg/dL   GFR calc non Af Amer 88 (L) >90 mL/min   GFR calc Af Amer >90 >90 mL/min    Comment: (NOTE) The eGFR has been calculated using the CKD EPI equation. This calculation has not been validated  in all clinical situations. eGFR's persistently <90 mL/min signify possible Chronic Kidney Disease.    Anion gap 8 5 - 15  CBC     Status: Abnormal   Collection Time: 04/22/14  3:33 AM  Result Value Ref Range   WBC 11.4 (H) 4.0 - 10.5 K/uL   RBC 3.66 (L) 3.87 - 5.11 MIL/uL   Hemoglobin 8.5 (L) 12.0 - 15.0 g/dL   HCT 30.5 (L) 36.0 - 46.0 %   MCV  83.3 78.0 - 100.0 fL   MCH 23.2 (L) 26.0 - 34.0 pg   MCHC 27.9 (L) 30.0 - 36.0 g/dL   RDW 16.8 (H) 11.5 - 15.5 %   Platelets 258 150 - 400 K/uL  Comprehensive metabolic panel     Status: Abnormal   Collection Time: 04/23/14  3:21 AM  Result Value Ref Range   Sodium 138 135 - 145 mmol/L   Potassium 3.8 3.5 - 5.1 mmol/L   Chloride 85 (L) 96 - 112 mmol/L   CO2 44 (HH) 19 - 32 mmol/L    Comment: CRITICAL RESULT CALLED TO, READ BACK BY AND VERIFIED WITH: [facility],H RN 04/23/2014 0518 JORDANS REPEATED TO VERIFY    Glucose, Bld 109 (H) 70 - 99 mg/dL   BUN 21 6 - 23 mg/dL   Creatinine, Ser 0.59 0.50 - 1.10 mg/dL   Calcium 8.5 8.4 - 10.5 mg/dL   Total Protein 5.0 (L) 6.0 - 8.3 g/dL   Albumin 3.2 (L) 3.5 - 5.2 g/dL   AST 29 0 - 37 U/L   ALT 34 0 - 35 U/L   Alkaline Phosphatase 37 (L) 39 - 117 U/L   Total Bilirubin 0.6 0.3 - 1.2 mg/dL   GFR calc non Af Amer 89 (L) >90 mL/min   GFR calc Af Amer >90 >90 mL/min    Comment: (NOTE) The eGFR has been calculated using the CKD EPI equation. This calculation has not been validated in all clinical situations. eGFR's persistently <90 mL/min signify possible Chronic Kidney Disease.    Anion gap 9 5 - 15  Blood gas, arterial     Status: Abnormal   Collection Time: 04/23/14  4:15 AM  Result Value Ref Range   O2 Content 4.0 L/min   Delivery systems NASAL CANNULA    pH, Arterial 7.473 (H) 7.350 - 7.450   pCO2 arterial 60.9 (HH) 35.0 - 45.0 mmHg    Comment: CRITICAL RESULT CALLED TO, READ BACK BY AND VERIFIED WITH:  HANNA SAYLES RN AT 0423 BY KASEY JOYCE RRT ON 04/23/14    pO2, Arterial 64.7 (L) 80.0 - 100.0 mmHg   Bicarbonate 44.1 (H) 20.0 - 24.0 mEq/L   TCO2 45.9 0 - 100 mmol/L   Acid-Base Excess 18.8 (H) 0.0 - 2.0 mmol/L   O2 Saturation 92.0 %   Patient temperature 98.6    Collection site RIGHT RADIAL    Drawn by [phone]    Sample type ARTERIAL    Allens test (pass/fail) PASS PASS     HPI : 74 yr old with CAD, hypertension,  hyperlipidemia, SVT, AAA, COPD, bladder cancer and cerebrovascular disease sent from cardiology office for dyspnea that has worsened over the past 1 week. She has severe dyspnea with any activity and some dyspnea at rest. No fevers, chills or productive cough. No hemoptysis. She denies orthopnea or PND but she has had increased weight. She saw Dr. Stanford Breed her cardiologist today who increased her Lasix and recommended admission for acute on chronic heart failure however  patient refused. When she was outside the medical building she did develop some chest pain and increased shortness of breath and EMS was called. She was brought here to the emergency room. Upon arrival pt was pale with sats of 91% on 4 L, tachypneic in 30's with pressure of 90 sbp. EKG showed RBB. EMS gave 5 mg albuterol . She had about a 20-30 minutes some tightness in the center of her chest. She denies any chest pain currently. She still feels short of breath. She was given one nebulizer treatment around and feels a little bit better. She's on baseline oxygen at 4 L/m. She has had some increased cough but no change in color of her sputum. She denies any known fevers. She denies any nausea or vomiting ER workup : chest x-ray does not show edema. Her BNP is mildly elevated. There is no evidence of pneumonia. She did have some chest pain but her troponin is negative. Her EKG shows a right bundle branch block which is new from her past EKG but no ST elevation is noted. She's been given another nebulizer treatment here in the ED as well as Solu-Medrol. She's feeling better ,admitted for CHF ,COPD exacerbation.  HOSPITAL COURSE:   Acute on chronic hypoxic /hypercapnic respiratory failure (on 4L/O2 at home), likely multifactorial d/t COPD/CHF Multifactorial , CHF/COPD exacerbation Hospital stay complicated by COPD exacerbation requiring transfer to stepdown, She has chronic hypercapnic respiratory failure , and required BiPAP continuous for  a couple of days, Baseline ABG for this patient is CO2 of 60. When she is compensated Hypercapnia exacerbated by Klonopin and Tylenol with codeine both of these have been discontinued Followed by cardiology for treatment of acute on chronic diastolic congestive heart failure and PC CM for COPD exacerbation Chest x-ray suspicious for developing airspace disease  Empirically started on Rocephin, azithromycin. Patient will continue with azithromycin for another 5 days D-dimer negative,, troponin abnl 2-D echo Findings as below Initially diuresed with IV Lasix, subsequently switched to Lasix to 40 mg by mouth daily Initially started on IV Solu-Medrol, subsequently switched to by mouth prednisone with taper Brovana, Pulmicort, Xopenex    Essential hypertension-continue diltiazem, Lasix  Anemia without patient-reported bleeding Hemoglobin 8.6, stable 3 days   Acute-on-chronic respiratory failure , oxygen dependent at home on 4 L Requiring BiPAP, patient would benefit from outpatient pulmonary consultation as well as a sleep study, Hypercapnia exacerbated by klonopin and tylenol with codeine , avoid these medications   Peripheral vascular disease known AAA, f/u duplex was due 07/2014 but cath shows "large complex AAA with severe distal aortic disease and probable high-grade ostial disease in bilateral iliacs" Patient will need outpatient vascular follow-up   Hypokalemia Replete potassium  History of CVA Continue aspirin and Plavix   Coronary artery disease Continue with aspirin and Plavix, and cycle cardiac enzymes Troponin trending up S/p cath 2/22 which showed severe 2V calcific CAD with totally occluded LAD and high grade prox disease in dominant LCx that collateralizes her LAD - felt to be poor candidate for CABG; plan high risk PCI of LCx with possible rotational atherectomy  Repeat cardiac cath delayed because tenuous pulmonary status PCI 2/25, received successful  rotational atherectomy and DES to LCx Cardiology recommends to continue aspirin, statin, Plavix (likely cannot tolerate b-blocker due to severe COPD).  -- 2D ECHO with new low EF and WMA  Anticipate she can discharge tomorrow if stable from a cardiology standpoint, per RN ok per cards to DC  Patient may be  appropriate for pulmonary rehabilitation She is to follow-up with Dr. Stanford Breed   Gastroesophageal reflux Continue with Protonix  Atrial fibrillation continue with amiodarone, Cardizem  Anxiety disorder-discontinued Klonopin    Discharge Exam:    Blood pressure 126/60, pulse 81, temperature 97.6 F (36.4 C), temperature source Oral, resp. rate 18, height _0  (1.651 m), weight 78.2 kg (172 lb 6.4 oz), SpO2 100 %.  General: Obese elderly white female, alert. [place]  HEENT: PERRL, moist mucus membranes.  Neck: Supple, no lymphadenopathy or carotid bruits Lungs: Decreased BS bilaterally Heart: Tachycardia, no murmurs, gallops, or rubs Abdomen: Soft, non-tender, non-distended, BS + Extremities: No cyanosis, clubbing, or edema Neurologic: Alert, oriented moves all extremities spontaneously       Discharge Instructions    Diet - low sodium heart healthy    Complete by:  As directed      Diet - low sodium heart healthy    Complete by:  As directed      Increase activity slowly    Complete by:  As directed      Increase activity slowly    Complete by:  As directed            Follow-up Information    Follow up with Sherrie Mustache, MD. Schedule an appointment as soon as possible for a visit in 1 week.   Specialty:  Family Medicine   Contact information:   [place]  [address] [phone]       Follow up with pulmonary. Call in 2 weeks.   Why:  sleep study   Contact information:   [address], [place]   Phone: [phone]  Fax: [phone]       Signed: Reyne Dumas 04/23/2014, 11:16 AM

## 2014-04-25 ENCOUNTER — Telehealth: Payer: Self-pay | Admitting: *Deleted

## 2014-04-25 NOTE — Telephone Encounter (Signed)
-----   Message from Debbora Dus sent at 04/25/2014  4:23 PM EST ----- Regarding: TOC Patient 7-10 day TOC fu appt per [facility], appt 05-02-14 with Scott at [place]

## 2014-04-26 ENCOUNTER — Emergency Department (HOSPITAL_COMMUNITY): Payer: Medicare Other

## 2014-04-26 ENCOUNTER — Encounter (HOSPITAL_COMMUNITY): Payer: Self-pay | Admitting: Emergency Medicine

## 2014-04-26 ENCOUNTER — Inpatient Hospital Stay (HOSPITAL_COMMUNITY)
Admission: EM | Admit: 2014-04-26 | Discharge: 2014-04-29 | DRG: 391 | Disposition: A | Discharge status: Skilled Nursing Facility | Payer: Medicare Other | Attending: Internal Medicine | Admitting: Internal Medicine

## 2014-04-26 DIAGNOSIS — R339 Retention of urine, unspecified: Secondary | ICD-10-CM | POA: Diagnosis present

## 2014-04-26 DIAGNOSIS — E860 Dehydration: Secondary | ICD-10-CM | POA: Diagnosis present

## 2014-04-26 DIAGNOSIS — E876 Hypokalemia: Secondary | ICD-10-CM | POA: Diagnosis present

## 2014-04-26 DIAGNOSIS — I214 Non-ST elevation (NSTEMI) myocardial infarction: Secondary | ICD-10-CM | POA: Diagnosis present

## 2014-04-26 DIAGNOSIS — I5042 Chronic combined systolic (congestive) and diastolic (congestive) heart failure: Secondary | ICD-10-CM | POA: Diagnosis present

## 2014-04-26 DIAGNOSIS — Z9981 Dependence on supplemental oxygen: Secondary | ICD-10-CM

## 2014-04-26 DIAGNOSIS — Z833 Family history of diabetes mellitus: Secondary | ICD-10-CM | POA: Diagnosis not present

## 2014-04-26 DIAGNOSIS — K529 Noninfective gastroenteritis and colitis, unspecified: Principal | ICD-10-CM | POA: Diagnosis present

## 2014-04-26 DIAGNOSIS — I482 Chronic atrial fibrillation: Secondary | ICD-10-CM | POA: Diagnosis present

## 2014-04-26 DIAGNOSIS — Z8551 Personal history of malignant neoplasm of bladder: Secondary | ICD-10-CM

## 2014-04-26 DIAGNOSIS — J449 Chronic obstructive pulmonary disease, unspecified: Secondary | ICD-10-CM | POA: Diagnosis present

## 2014-04-26 DIAGNOSIS — I2510 Atherosclerotic heart disease of native coronary artery without angina pectoris: Secondary | ICD-10-CM | POA: Diagnosis present

## 2014-04-26 DIAGNOSIS — I471 Supraventricular tachycardia: Secondary | ICD-10-CM | POA: Diagnosis present

## 2014-04-26 DIAGNOSIS — R197 Diarrhea, unspecified: Secondary | ICD-10-CM | POA: Diagnosis present

## 2014-04-26 DIAGNOSIS — R778 Other specified abnormalities of plasma proteins: Secondary | ICD-10-CM | POA: Diagnosis present

## 2014-04-26 DIAGNOSIS — F17210 Nicotine dependence, cigarettes, uncomplicated: Secondary | ICD-10-CM | POA: Diagnosis present

## 2014-04-26 DIAGNOSIS — Z8701 Personal history of pneumonia (recurrent): Secondary | ICD-10-CM

## 2014-04-26 DIAGNOSIS — I10 Essential (primary) hypertension: Secondary | ICD-10-CM | POA: Diagnosis present

## 2014-04-26 DIAGNOSIS — F419 Anxiety disorder, unspecified: Secondary | ICD-10-CM | POA: Diagnosis present

## 2014-04-26 DIAGNOSIS — Z8673 Personal history of transient ischemic attack (TIA), and cerebral infarction without residual deficits: Secondary | ICD-10-CM | POA: Diagnosis not present

## 2014-04-26 DIAGNOSIS — K219 Gastro-esophageal reflux disease without esophagitis: Secondary | ICD-10-CM | POA: Diagnosis present

## 2014-04-26 DIAGNOSIS — R7989 Other specified abnormal findings of blood chemistry: Secondary | ICD-10-CM

## 2014-04-26 DIAGNOSIS — E785 Hyperlipidemia, unspecified: Secondary | ICD-10-CM | POA: Diagnosis present

## 2014-04-26 DIAGNOSIS — Z8249 Family history of ischemic heart disease and other diseases of the circulatory system: Secondary | ICD-10-CM | POA: Diagnosis not present

## 2014-04-26 DIAGNOSIS — H409 Unspecified glaucoma: Secondary | ICD-10-CM | POA: Diagnosis present

## 2014-04-26 DIAGNOSIS — Z955 Presence of coronary angioplasty implant and graft: Secondary | ICD-10-CM

## 2014-04-26 DIAGNOSIS — D638 Anemia in other chronic diseases classified elsewhere: Secondary | ICD-10-CM | POA: Diagnosis present

## 2014-04-26 DIAGNOSIS — M1990 Unspecified osteoarthritis, unspecified site: Secondary | ICD-10-CM | POA: Diagnosis present

## 2014-04-26 LAB — BLOOD GAS, ARTERIAL
Acid-Base Excess: 9.6 mmol/L — ABNORMAL HIGH (ref 0.0–2.0)
BICARBONATE: 35.2 meq/L — AB (ref 20.0–24.0)
DRAWN BY: 25788
O2 Content: 5 L/min
O2 Saturation: 91.9 %
PH ART: 7.359 (ref 7.350–7.450)
PO2 ART: 72.6 mmHg — AB (ref 80.0–100.0)
Patient temperature: 37
TCO2: 33.4 mmol/L (ref 0–100)
pCO2 arterial: 64.1 mmHg (ref 35.0–45.0)

## 2014-04-26 LAB — CBC WITH DIFFERENTIAL/PLATELET
Basophils Absolute: 0.1 10*3/uL (ref 0.0–0.1)
Basophils Relative: 0 % (ref 0–1)
EOS ABS: 0 10*3/uL (ref 0.0–0.7)
Eosinophils Relative: 0 % (ref 0–5)
HEMATOCRIT: 32.7 % — AB (ref 36.0–46.0)
HEMOGLOBIN: 9 g/dL — AB (ref 12.0–15.0)
LYMPHS ABS: 0.3 10*3/uL — AB (ref 0.7–4.0)
LYMPHS PCT: 1 % — AB (ref 12–46)
MCH: 24.1 pg — ABNORMAL LOW (ref 26.0–34.0)
MCHC: 27.5 g/dL — ABNORMAL LOW (ref 30.0–36.0)
MCV: 87.7 fL (ref 78.0–100.0)
MONOS PCT: 8 % (ref 3–12)
Monocytes Absolute: 1.9 10*3/uL — ABNORMAL HIGH (ref 0.1–1.0)
NEUTROS PCT: 91 % — AB (ref 43–77)
Neutro Abs: 21.9 10*3/uL — ABNORMAL HIGH (ref 1.7–7.7)
RBC: 3.73 MIL/uL — ABNORMAL LOW (ref 3.87–5.11)
RDW: 17.9 % — ABNORMAL HIGH (ref 11.5–15.5)
WBC: 24.2 10*3/uL — ABNORMAL HIGH (ref 4.0–10.5)

## 2014-04-26 LAB — TROPONIN I: TROPONIN I: 0.19 ng/mL — AB (ref <0.031)

## 2014-04-26 LAB — COMPREHENSIVE METABOLIC PANEL
ALBUMIN: 3.7 g/dL (ref 3.5–5.2)
ALT: 37 U/L — ABNORMAL HIGH (ref 0–35)
AST: 30 U/L (ref 0–37)
Alkaline Phosphatase: 47 U/L (ref 39–117)
Anion gap: 9 (ref 5–15)
BILIRUBIN TOTAL: 1.1 mg/dL (ref 0.3–1.2)
BUN: 17 mg/dL (ref 6–23)
CO2: 38 mmol/L — ABNORMAL HIGH (ref 19–32)
CREATININE: 0.72 mg/dL (ref 0.50–1.10)
Calcium: 8.5 mg/dL (ref 8.4–10.5)
Chloride: 91 mmol/L — ABNORMAL LOW (ref 96–112)
GFR calc Af Amer: >90 mL/min (ref >90)
GFR calc non Af Amer: 84 mL/min — ABNORMAL LOW (ref >90)
Glucose, Bld: 161 mg/dL — ABNORMAL HIGH (ref 70–99)
POTASSIUM: 3.9 mmol/L (ref 3.5–5.1)
Sodium: 138 mmol/L (ref 135–145)
Total Protein: 6.5 g/dL (ref 6.0–8.3)

## 2014-04-26 LAB — BRAIN NATRIURETIC PEPTIDE: B NATRIURETIC PEPTIDE 5: 294 pg/mL — AB (ref 0.0–100.0)

## 2014-04-26 MED ORDER — PROMETHAZINE HCL 12.5 MG PO TABS: 25.0000 mg | ORAL_TABLET | ORAL | Status: DC | PRN

## 2014-04-26 MED ORDER — DILTIAZEM HCL ER COATED BEADS 180 MG PO CP24
300.0000 mg | ORAL_CAPSULE | Freq: Every day | ORAL | Status: DC
  Administered 2014-04-27 – 2014-04-29 (×3): 300 mg via ORAL
  Filled 2014-04-26 (×6): qty 1

## 2014-04-26 MED ORDER — NYSTATIN 100000 UNIT/ML MT SUSP
5.0000 mL | Freq: Four times a day (QID) | OROMUCOSAL | Status: DC
  Administered 2014-04-26 – 2014-04-29 (×11): 500000 [IU] via ORAL
  Filled 2014-04-26 (×12): qty 5

## 2014-04-26 MED ORDER — IPRATROPIUM BROMIDE 0.02 % IN SOLN
0.5000 mg | RESPIRATORY_TRACT | Status: DC | PRN
  Administered 2014-04-27 – 2014-04-29 (×2): 0.5 mg via RESPIRATORY_TRACT
  Filled 2014-04-26 (×2): qty 2.5

## 2014-04-26 MED ORDER — PRAVASTATIN SODIUM 40 MG PO TABS
80.0000 mg | ORAL_TABLET | Freq: Every day | ORAL | Status: DC
  Administered 2014-04-26 – 2014-04-28 (×3): 80 mg via ORAL
  Filled 2014-04-26 (×3): qty 2

## 2014-04-26 MED ORDER — HEPARIN SODIUM (PORCINE) 5000 UNIT/ML IJ SOLN
5000.0000 [IU] | Freq: Three times a day (TID) | INTRAMUSCULAR | Status: DC
  Administered 2014-04-26 – 2014-04-29 (×9): 5000 [IU] via SUBCUTANEOUS
  Filled 2014-04-26 (×9): qty 1

## 2014-04-26 MED ORDER — AMIODARONE HCL 200 MG PO TABS
200.0000 mg | ORAL_TABLET | Freq: Every day | ORAL | Status: DC
  Administered 2014-04-26 – 2014-04-28 (×3): 200 mg via ORAL
  Filled 2014-04-26 (×3): qty 1

## 2014-04-26 MED ORDER — GUAIFENESIN-DM 100-10 MG/5ML PO SYRP: 5.0000 mL | ORAL_SOLUTION | Freq: Two times a day (BID) | ORAL | Status: DC | PRN

## 2014-04-26 MED ORDER — ONDANSETRON HCL 4 MG PO TABS: 4.0000 mg | ORAL_TABLET | Freq: Four times a day (QID) | ORAL | Status: DC | PRN

## 2014-04-26 MED ORDER — PREDNISONE 10 MG PO TABS
5.0000 mg | ORAL_TABLET | Freq: Every day | ORAL | Status: DC
  Administered 2014-04-27 – 2014-04-29 (×3): 5 mg via ORAL
  Filled 2014-04-26 (×3): qty 1

## 2014-04-26 MED ORDER — LEVALBUTEROL TARTRATE 45 MCG/ACT IN AERO: 2.0000 | INHALATION_SPRAY | RESPIRATORY_TRACT | Status: DC | PRN

## 2014-04-26 MED ORDER — SODIUM CHLORIDE 0.9 % IV SOLN: INTRAVENOUS | Status: DC

## 2014-04-26 MED ORDER — ACETAMINOPHEN 325 MG PO TABS
650.0000 mg | ORAL_TABLET | ORAL | Status: DC | PRN
  Administered 2014-04-26 – 2014-04-29 (×5): 650 mg via ORAL
  Filled 2014-04-26 (×5): qty 2

## 2014-04-26 MED ORDER — PANTOPRAZOLE SODIUM 40 MG PO TBEC
40.0000 mg | DELAYED_RELEASE_TABLET | Freq: Every day | ORAL | Status: DC
  Administered 2014-04-27 – 2014-04-29 (×3): 40 mg via ORAL
  Filled 2014-04-26 (×3): qty 1

## 2014-04-26 MED ORDER — ONDANSETRON HCL 4 MG/2ML IJ SOLN
4.0000 mg | Freq: Four times a day (QID) | INTRAMUSCULAR | Status: DC | PRN
  Administered 2014-04-29 (×2): 4 mg via INTRAVENOUS
  Filled 2014-04-26 (×3): qty 2

## 2014-04-26 MED ORDER — FAMOTIDINE 20 MG PO TABS
20.0000 mg | ORAL_TABLET | Freq: Every day | ORAL | Status: DC
  Administered 2014-04-27 – 2014-04-29 (×3): 20 mg via ORAL
  Filled 2014-04-26 (×3): qty 1

## 2014-04-26 MED ORDER — LATANOPROST 0.005 % OP SOLN
OPHTHALMIC | Status: AC
  Filled 2014-04-26: qty 2.5

## 2014-04-26 MED ORDER — ARFORMOTEROL TARTRATE 15 MCG/2ML IN NEBU
15.0000 ug | INHALATION_SOLUTION | Freq: Two times a day (BID) | RESPIRATORY_TRACT | Status: DC
Start: 2014-04-26 — End: 2014-04-29
  Administered 2014-04-27 – 2014-04-29 (×5): 15 ug via RESPIRATORY_TRACT
  Filled 2014-04-26 (×12): qty 2

## 2014-04-26 MED ORDER — FUROSEMIDE 40 MG PO TABS
40.0000 mg | ORAL_TABLET | Freq: Every day | ORAL | Status: DC
  Administered 2014-04-27 – 2014-04-29 (×3): 40 mg via ORAL
  Filled 2014-04-26 (×3): qty 1

## 2014-04-26 MED ORDER — CLOPIDOGREL BISULFATE 75 MG PO TABS
75.0000 mg | ORAL_TABLET | Freq: Every day | ORAL | Status: DC
  Administered 2014-04-26 – 2014-04-28 (×3): 75 mg via ORAL
  Filled 2014-04-26 (×3): qty 1

## 2014-04-26 MED ORDER — LEVALBUTEROL HCL 0.63 MG/3ML IN NEBU
0.6300 mg | INHALATION_SOLUTION | Freq: Four times a day (QID) | RESPIRATORY_TRACT | Status: DC | PRN
  Administered 2014-04-27 – 2014-04-29 (×2): 0.63 mg via RESPIRATORY_TRACT
  Filled 2014-04-26 (×2): qty 3

## 2014-04-26 MED ORDER — AZITHROMYCIN 250 MG PO TABS
500.0000 mg | ORAL_TABLET | Freq: Every day | ORAL | Status: DC
  Administered 2014-04-27: 500 mg via ORAL
  Filled 2014-04-26: qty 2

## 2014-04-26 MED ORDER — POTASSIUM CHLORIDE CRYS ER 20 MEQ PO TBCR
40.0000 meq | EXTENDED_RELEASE_TABLET | Freq: Every day | ORAL | Status: DC
  Administered 2014-04-27 – 2014-04-29 (×3): 40 meq via ORAL
  Filled 2014-04-26: qty 2
  Filled 2014-04-26: qty 4
  Filled 2014-04-26: qty 2

## 2014-04-26 MED ORDER — NITROGLYCERIN 0.4 MG SL SUBL: 0.4000 mg | SUBLINGUAL_TABLET | SUBLINGUAL | Status: DC | PRN

## 2014-04-26 MED ORDER — SODIUM CHLORIDE 0.9 % IV BOLUS (SEPSIS)
500.0000 mL | Freq: Once | INTRAVENOUS | Status: AC
  Administered 2014-04-26: 500 mL via INTRAVENOUS

## 2014-04-26 MED ORDER — ASPIRIN EC 81 MG PO TBEC
81.0000 mg | DELAYED_RELEASE_TABLET | Freq: Every day | ORAL | Status: DC
  Administered 2014-04-26 – 2014-04-28 (×3): 81 mg via ORAL
  Filled 2014-04-26 (×3): qty 1

## 2014-04-26 MED ORDER — SODIUM CHLORIDE 0.9 % IV SOLN
INTRAVENOUS | Status: DC
  Administered 2014-04-26: 22:00:00 via INTRAVENOUS

## 2014-04-26 MED ORDER — LATANOPROST 0.005 % OP SOLN
1.0000 [drp] | Freq: Every day | OPHTHALMIC | Status: DC
  Administered 2014-04-26 – 2014-04-28 (×3): 1 [drp] via OPHTHALMIC
  Filled 2014-04-26: qty 2.5

## 2014-04-26 NOTE — ED Notes (Signed)
CRITICAL VALUE ALERT  Critical value received:  PCO2  Date of notification:  04/26/14  Time of notification:  1605  Critical value read back:Yes.    Nurse who received alert:  Maxwell Caul, RN  MD notified (1st page):  Dr Betsey Holiday  Time of first page:  [phone]

## 2014-04-26 NOTE — H&P (Signed)
[facility] History and Physical  [name] [name] BJY:[id number] DOB: 11-Apr-1940 DOA: 04/26/2014  Referring physician: ER PCP: Sherrie Mustache, MD   Chief Complaint: Diarrhea  HPI: [name] [name] is a 74 y.o. female  This is a 74 year old lady who gives a four-day history of diarrhea. She was recently discharged approximately 4 days ago from [facility]. She was admitted there because of dyspnea. It was felt this was multifactorial, COPD and exacerbation of CHF. She apparently spent time with BiPAP. She also underwent cardiac catheterization and had PCI done. It seems that she left the hospital on tapering dose of prednisone and antibiotics. She had been on antibiotics during the hospitalization. She has noticed that she has had several episodes of diarrhea although it seems to be easing off with the stool becoming more formed in the last 24 hours or so. She has had no vomiting or abdominal pain. There is no hematemesis or any rectal bleeding. She denies any obvious fever. Evaluation in the emergency room showed her to be clinically dehydrated although electrolytes were acceptable. She has become rather weak and she is going to be admitted for further management. There was concern about C. difficile colitis.   Review of Systems:  Apart from symptoms above, all systems negative.  Past Medical History  Diagnosis Date  . Hypertension   . Hyperlipidemia   . Cerebrovascular disease, unspecified   . Unspecified glaucoma   . Obesity, unspecified   . Coronary artery disease     cath 04/06/10: LAD occluded with R-L collats; med Rx WTC....probable V. Tach...med Rx  . Ventricular tachycardia   . SVT (supraventricular tachycardia)   . AAA (abdominal aortic aneurysm)   . COPD (chronic obstructive pulmonary disease) 2013    [place] of breath     WITH ACTIVITY--USES OXYGEN AS NEEDED  2&1/2 L PER NASAL CANNUL  . Complication of anesthesia     PT STATES SHE CAN NOT BE  PUT TO SLEEP BECAUSE OF HER LUNG PROBLEMS  . Carotid artery occlusion   . Anemia   . CHF (congestive heart failure)   . Abdominal aneurysm     "hasn't been repaired" (10/20/2013)  . Pneumonia X ~ 2  . On home oxygen therapy     "4L; 24/7" (10/20/2013)  . GERD (gastroesophageal reflux disease)   . Migraines     "before tubal ligation I had them alot; seldom have one anymore" (10/20/2013)  . Arthritis     "knees mainly" (10/20/2013)  . Bladder cancer 2009  . Atrial fibrillation   . Bell palsy 2014   Past Surgical History  Procedure Laterality Date  . Cystoscopy    . Bladder tumor excision      resection of 5 bladder, and fulguration of 1 small bladder tumor  . Cystoscopy      cold cup bladder biopsy of five tumors, and fulguration of one tumor  . Wedge resection Right 1980's    Remote right lung  . Cystoscopy with biopsy  12/24/2011    Procedure: CYSTOSCOPY WITH BIOPSY;  Surgeon: Fredricka Bonine, MD;  Location: [facility];  Service: Urology;  Laterality: N/A;  . Tubal ligation Bilateral 1980's  . Cataract extraction w/ intraocular lens  implant, bilateral Bilateral ~ 2011  . Cardiac catheterization  "several"    "too bad to put stents in"  . Left heart catheterization with coronary angiogram N/A 04/18/2014    Procedure: LEFT HEART CATHETERIZATION WITH CORONARY ANGIOGRAM;  Surgeon: Quillian Quince  R Bensimhon, MD;  Location: [facility] CATH LAB;  Service: Cardiovascular;  Laterality: N/A;  . Percutaneous coronary rotoblator intervention (pci-r) N/A 04/21/2014    Procedure: PERCUTANEOUS CORONARY ROTOBLATOR INTERVENTION (PCI-R);  Surgeon: Blane Ohara, MD;  Location: [facility] CATH LAB;  Service: Cardiovascular;  Laterality: N/A;   Social History:  reports that she quit smoking about 2 years ago. Her smoking use included Cigarettes. She has a 100 pack-year smoking history. She has never used smokeless tobacco. She reports that she does not drink alcohol or use illicit drugs.  Allergies  Allergen  Reactions  . Albuterol Other (See Comments)    Jittery/shakiness  . Levaquin [Levofloxacin In D5w] Nausea Only  . Sulfa Antibiotics Nausea Only    Family History  Problem Relation Age of Onset  . Heart disease Mother 30  . Heart disease Father   . Hyperlipidemia Father   . Hypertension Father   . Diabetes Daughter   . Peripheral vascular disease Daughter     Prior to Admission medications   Medication Sig Start Date End Date Taking? Authorizing Provider  acetaminophen (TYLENOL) 325 MG tablet Take 2 tablets (650 mg total) by mouth every 4 (four) hours as needed for headache or mild pain. 04/23/14  Yes Reyne Dumas, MD  amiodarone (PACERONE) 200 MG tablet Take 200 mg by mouth at bedtime.  09/20/11  Yes Lelon Perla, MD  aspirin EC 81 MG tablet Take 81 mg by mouth at bedtime.   Yes Historical Provider, MD  azithromycin (ZITHROMAX) 500 MG tablet Take 1 tablet (500 mg total) by mouth daily. 04/23/14  Yes Reyne Dumas, MD  clopidogrel (PLAVIX) 75 MG tablet Take 1 tablet (75 mg total) by mouth at bedtime. 12/26/11  Yes Festus Aloe, MD  Dextromethorphan-Guaifenesin ([place] FAST-MAX DM MAX) 5-100 MG/5ML LIQD Take 5 mLs by mouth every 12 (twelve) hours as needed (for congestion). 04/23/14  Yes Reyne Dumas, MD  diltiazem (CARDIZEM CD) 300 MG 24 hr capsule TAKE 1 CAPSULE IN THE EVENING. 02/07/14  Yes Lelon Perla, MD  furosemide (LASIX) 40 MG tablet Take 1 tablet (40 mg total) by mouth daily. 04/17/14  Yes Reyne Dumas, MD  ipratropium (ATROVENT) 0.02 % nebulizer solution USE 1 VIAL IN NEBULIZER EVERY 6 HOURS. 03/14/14  Yes Tanda Rockers, MD  levalbuterol [facility] HFA) 45 MCG/ACT inhaler Inhale 2 puffs into the lungs every 4 (four) hours as needed for wheezing. 09/07/13  Yes Tammy S Parrett, NP  nitroGLYCERIN (NITROSTAT) 0.4 MG SL tablet Place 0.4 mg under the tongue every 5 (five) minutes as needed for chest pain (may repeat x3). Chest pain 07/10/10  Yes Lelon Perla, MD  nystatin  (MYCOSTATIN) 100000 UNIT/ML suspension Take 5 mLs by mouth 4 (four) times daily.  04/04/14  Yes Historical Provider, MD  pantoprazole (PROTONIX) 40 MG tablet TAKE 1 TABLET BY MOUTH ONCE DAILY 30-60 MINTUES BEFORE FIRST MEAL OF THE DAY. 03/07/14  Yes Tanda Rockers, MD  potassium chloride SA (K-DUR,KLOR-CON) 20 MEQ tablet Take 40 mEq by mouth daily.    Yes Historical Provider, MD  pravastatin (PRAVACHOL) 80 MG tablet Take 80 mg by mouth at bedtime.   Yes Historical Provider, MD  predniSONE (DELTASONE) 1 MG tablet 6 tablets for 3 days 5 tablets for 3 days 4 tablets for 3 days 3 tablets for 3 days 2 tablets for 3 days 1 tablet for 3 days then discontinue 04/23/14  Yes Reyne Dumas, MD  promethazine (PHENERGAN) 25 MG tablet Take 25 mg by  mouth every 4 (four) hours as needed for nausea.    Yes Historical Provider, MD  ranitidine (ZANTAC) 150 MG capsule Take 150 mg by mouth at bedtime.   Yes Historical Provider, MD  Travoprost, BAK Free, (TRAVATAN) 0.004 % SOLN ophthalmic solution Place 1 drop into both eyes at bedtime.   Yes Historical Provider, MD  arformoterol (BROVANA) 15 MCG/2ML NEBU Take 2 mLs (15 mcg total) by nebulization 2 (two) times daily. 04/23/14   Reyne Dumas, MD   Physical Exam: Filed Vitals:   04/26/14 1630 04/26/14 1700 04/26/14 1713 04/26/14 1836  BP: 126/66 102/69 102/69 122/44  Pulse: 79 76 76 81  Temp:    98 F (36.7 C)  TempSrc:    Oral  Resp: 34 20 26 24   Height:    5\' 5"  (1.651 m)  Weight:    76.6 kg (168 lb 14 oz)  SpO2: 100% 100% 100% 100%    Wt Readings from Last 3 Encounters:  04/26/14 76.6 kg (168 lb 14 oz)  04/23/14 78.2 kg (172 lb 6.4 oz)  12/29/13 81.194 kg (179 lb)    General:  Appears clinically dehydrated. She is not toxic or septic. She is hemodynamically stable. Eyes: PERRL, normal lids, irises & conjunctiva ENT: grossly normal hearing, lips & tongue Neck: no LAD, masses or thyromegaly Cardiovascular: RRR, no m/r/g. No LE edema. Telemetry: SR, no  arrhythmias  Respiratory: CTA bilaterally, no w/r/r. Normal respiratory effort. Abdomen: soft, ntnd Skin: no rash or induration seen on limited exam Musculoskeletal: grossly normal tone BUE/BLE Psychiatric: grossly normal mood and affect, speech fluent and appropriate Neurologic: grossly non-focal.          Labs on Admission:  Basic Metabolic Panel:  Recent Labs Lab 04/20/14 0310 04/21/14 0240 04/21/14 1630 04/22/14 0333 04/23/14 0321 04/26/14 1358  NA 140 141  --  137 138 138  K 3.8 3.4*  --  3.6 3.8 3.9  CL 90* 85*  --  85* 85* 91*  CO2 46* 45*  --  44* 44* 38*  GLUCOSE 125* 158*  --  164* 109* 161*  BUN 20 20  --  23 21 17   CREATININE 0.63 0.63 0.62 0.62 0.59 0.72  CALCIUM 8.5 8.9  --  8.5 8.5 8.5   Liver Function Tests:  Recent Labs Lab 04/23/14 0321 04/26/14 1358  AST 29 30  ALT 34 37*  ALKPHOS 37* 47  BILITOT 0.6 1.1  PROT 5.0* 6.5  ALBUMIN 3.2* 3.7   No results for input(s): LIPASE, AMYLASE in the last 168 hours. No results for input(s): AMMONIA in the last 168 hours. CBC:  Recent Labs Lab 04/20/14 0310 04/21/14 0240 04/21/14 1630 04/22/14 0333 04/26/14 1358  WBC 11.9* 8.7 13.5* 11.4* 24.2*  NEUTROABS  --   --   --   --  21.9*  HGB 8.6* 8.6* 8.6* 8.5* 9.0*  HCT 31.8* 30.6* 30.8* 30.5* 32.7*  MCV 86.6 83.4 83.2 83.3 87.7  PLT 271 263 292 258 LARGE PLATELETS PRESENT   Cardiac Enzymes:  Recent Labs Lab 04/26/14 1358  TROPONINI 0.19*    BNP (last 3 results)  Recent Labs  04/15/14 1423 04/26/14 1358  BNP 229.9* 294.0*    ProBNP (last 3 results)  Recent Labs  11/11/13 1424 12/29/13 1238 04/15/14 1417  PROBNP 671.3* 195.0* 230.0*    CBG: No results for input(s): GLUCAP in the last 168 hours.  Radiological Exams on Admission: Dg Chest Portable 1 View  04/26/2014   CLINICAL DATA:  Low oxygen saturation  EXAM: PORTABLE CHEST - 1 VIEW  COMPARISON:  04/19/2014  FINDINGS: Cardiac shadow remains enlarged. The degree of vascular  congestion is improved when compared with the prior exam. A focal infiltrate is seen. No acute bony abnormality is noted.  IMPRESSION: No active disease.   Electronically Signed   By: Inez Catalina M.D.   On: 04/26/2014 14:24    EKG: Independently reviewed. Sinus rhythm, right bundle branch block pattern. No change from previous ECG. No acute ST elevation.  Assessment/Plan   1. Diarrhea with clinical dehydration. The etiology of this is not entirely clear but the concern is for C. difficile colitis. She has an elevated white count of around 24, although she has been on steroids. We will await stool studies for C. difficile colitis. I'm somewhat hesitant to start her on oral vancomycin at the present time until we get some results as her diarrhea seems to be somewhat improving according to her. 2. COPD, stable. 3. Recent pneumonia, stable. Continue with Zithromax. 4. Hypertension, stable. 5. Coronary artery disease, stable. She does not have any chest pain at the present time or any worrisome features. 6. Anemia. This is likely of chronic disease and is stable.  Further recommendations will depend on patient's hospital progress  Code Status: Full code.   DVT Prophylaxis: Heparin.    Family Communication: I discussed the plan with the patient at the bedside.   Disposition Plan: Home when medically stable.   Time spent: 60 minutes.  Doree Albee [facility] Pager [phone].

## 2014-04-26 NOTE — ED Notes (Signed)
Patient with c/o diarrhea since Saturday. Started Zithromax Sunday for Pneumonia. Patient has had diarrhea since starting. MD wants patient checked for c. Diff. EMS reports low oxygen saturations at home on 4 L [place] at home which she always uses. EMS reports sats in 37s. Patient is alert/oriented.

## 2014-04-26 NOTE — ED Notes (Signed)
Attempted to call report. RN to call back. 

## 2014-04-26 NOTE — ED Provider Notes (Signed)
CSN: [id number]     Arrival date & time 04/26/14  1316 History   First MD Initiated Contact with Patient 04/26/14 1349     Chief Complaint  Patient presents with  . Diarrhea     (Consider location/radiation/quality/duration/timing/severity/associated sxs/prior Treatment) HPI Comments: Patient presents to the ER for evaluation of diarrhea. Patient reports that she was recently hospitalized for pneumonia. She went home from the hospital 4 days ago. She reports that when she got home she started to have diarrhea. Since then the diarrhea has worsened. She reports that it is constant, watery diarrhea now. Week. Her doctor sent her to the emergency department for further evaluation. She has not had any nausea or vomiting. She has not noticed any fever. Patient is not experiencing chest pain, but she continues to have cough and shortness of breath. Upon arrival to the ER, oxygen saturation was 70%.  Patient is a 74 y.o. female presenting with diarrhea.  Diarrhea Associated symptoms: no abdominal pain     Past Medical History  Diagnosis Date  . Hypertension   . Hyperlipidemia   . Cerebrovascular disease, unspecified   . Unspecified glaucoma   . Obesity, unspecified   . Coronary artery disease     cath 04/06/10: LAD occluded with R-L collats; med Rx WTC....probable V. Tach...med Rx  . Ventricular tachycardia   . SVT (supraventricular tachycardia)   . AAA (abdominal aortic aneurysm)   . COPD (chronic obstructive pulmonary disease) 2013    [place] of breath     WITH ACTIVITY--USES OXYGEN AS NEEDED  2&1/2 L PER NASAL CANNUL  . Complication of anesthesia     PT STATES SHE CAN NOT BE PUT TO SLEEP BECAUSE OF HER LUNG PROBLEMS  . Carotid artery occlusion   . Anemia   . CHF (congestive heart failure)   . Abdominal aneurysm     "hasn't been repaired" (10/20/2013)  . Pneumonia X ~ 2  . On home oxygen therapy     "4L; 24/7" (10/20/2013)  . GERD (gastroesophageal reflux disease)    . Migraines     "before tubal ligation I had them alot; seldom have one anymore" (10/20/2013)  . Arthritis     "knees mainly" (10/20/2013)  . Bladder cancer 2009  . Atrial fibrillation   . Bell palsy 2014   Past Surgical History  Procedure Laterality Date  . Cystoscopy    . Bladder tumor excision      resection of 5 bladder, and fulguration of 1 small bladder tumor  . Cystoscopy      cold cup bladder biopsy of five tumors, and fulguration of one tumor  . Wedge resection Right 1980's    Remote right lung  . Cystoscopy with biopsy  12/24/2011    Procedure: CYSTOSCOPY WITH BIOPSY;  Surgeon: Fredricka Bonine, MD;  Location: [facility];  Service: Urology;  Laterality: N/A;  . Tubal ligation Bilateral 1980's  . Cataract extraction w/ intraocular lens  implant, bilateral Bilateral ~ 2011  . Cardiac catheterization  "several"    "too bad to put stents in"  . Left heart catheterization with coronary angiogram N/A 04/18/2014    Procedure: LEFT HEART CATHETERIZATION WITH CORONARY ANGIOGRAM;  Surgeon: Jolaine Artist, MD;  [facility] C[facility] LAB;  Service: Cardiovascular;  Laterality: N/A;  . Percutaneous coronary rotoblator intervention (pci-r) N/A 04/21/2014    Procedure: PERCUTANEOUS CORONARY ROTOBLATOR INTERVENTION (PCI-R);  Surgeon: Blane Ohara, MD;  Location: [facility] CATH LAB;  Service: Cardiovascular;  Laterality: N/A;   Family History  Problem Relation Age of Onset  . Heart disease Mother 36  . Heart disease Father   . Hyperlipidemia Father   . Hypertension Father   . Diabetes Daughter   . Peripheral vascular disease Daughter    History  Substance Use Topics  . Smoking status: Former Smoker -- 2.00 packs/day for 50 years    Types: Cigarettes    Quit date: 05/18/2011  . Smokeless tobacco: Never Used  . Alcohol Use: No   OB History    No data available     Review of Systems  Constitutional: Positive for fatigue.  Respiratory: Positive for cough and shortness of breath.    Gastrointestinal: Positive for diarrhea. Negative for abdominal pain.  All other systems reviewed and are negative.     Allergies  Albuterol; Levaquin; and Sulfa antibiotics  Home Medications   Prior to Admission medications   Medication Sig Start Date End Date Taking? Authorizing Provider  acetaminophen (TYLENOL) 325 MG tablet Take 2 tablets (650 mg total) by mouth every 4 (four) hours as needed for headache or mild pain. 04/23/14  Yes Reyne Dumas, MD  amiodarone (PACERONE) 200 MG tablet Take 200 mg by mouth at bedtime.  09/20/11  Yes Lelon Perla, MD  aspirin EC 81 MG tablet Take 81 mg by mouth at bedtime.   Yes Historical Provider, MD  azithromycin (ZITHROMAX) 500 MG tablet Take 1 tablet (500 mg total) by mouth daily. 04/23/14  Yes Reyne Dumas, MD  clopidogrel (PLAVIX) 75 MG tablet Take 1 tablet (75 mg total) by mouth at bedtime. 12/26/11  Yes Festus Aloe, MD  Dextromethorphan-Guaifenesin ([place] FAST-MAX DM MAX) 5-100 MG/5ML LIQD Take 5 mLs by mouth every 12 (twelve) hours as needed (for congestion). 04/23/14  Yes Reyne Dumas, MD  diltiazem (CARDIZEM CD) 300 MG 24 hr capsule TAKE 1 CAPSULE IN THE EVENING. 02/07/14  Yes Lelon Perla, MD  furosemide (LASIX) 40 MG tablet Take 1 tablet (40 mg total) by mouth daily. 04/17/14  Yes Reyne Dumas, MD  ipratropium (ATROVENT) 0.02 % nebulizer solution USE 1 VIAL IN NEBULIZER EVERY 6 HOURS. 03/14/14  Yes Tanda Rockers, MD  levalbuterol [facility] HFA) 45 MCG/ACT inhaler Inhale 2 puffs into the lungs every 4 (four) hours as needed for wheezing. 09/07/13  Yes Tammy S Parrett, NP  nitroGLYCERIN (NITROSTAT) 0.4 MG SL tablet Place 0.4 mg under the tongue every 5 (five) minutes as needed for chest pain (may repeat x3). Chest pain 07/10/10  Yes Lelon Perla, MD  nystatin (MYCOSTATIN) 100000 UNIT/ML suspension Take 5 mLs by mouth 4 (four) times daily.  04/04/14  Yes Historical Provider, MD  pantoprazole (PROTONIX) 40 MG tablet TAKE 1 TABLET BY  MOUTH ONCE DAILY 30-60 MINTUES BEFORE FIRST MEAL OF THE DAY. 03/07/14  Yes Tanda Rockers, MD  potassium chloride SA (K-DUR,KLOR-CON) 20 MEQ tablet Take 40 mEq by mouth daily.    Yes Historical Provider, MD  pravastatin (PRAVACHOL) 80 MG tablet Take 80 mg by mouth at bedtime.   Yes Historical Provider, MD  predniSONE (DELTASONE) 1 MG tablet 6 tablets for 3 days 5 tablets for 3 days 4 tablets for 3 days 3 tablets for 3 days 2 tablets for 3 days 1 tablet for 3 days then discontinue 04/23/14  Yes Reyne Dumas, MD  promethazine (PHENERGAN) 25 MG tablet Take 25 mg by mouth every 4 (four) hours as needed for nausea.    Yes Historical Provider, MD  ranitidine (  ZANTAC) 150 MG capsule Take 150 mg by mouth at bedtime.   Yes Historical Provider, MD  Travoprost, BAK Free, (TRAVATAN) 0.004 % SOLN ophthalmic solution Place 1 drop into both eyes at bedtime.   Yes Historical Provider, MD  arformoterol (BROVANA) 15 MCG/2ML NEBU Take 2 mLs (15 mcg total) by nebulization 2 (two) times daily. 04/23/14   Reyne Dumas, MD   BP 118/49 mmHg  Pulse 80  Temp(Src) 98.2 F (36.8 C) (Oral)  Resp 20  Ht 5\' 5"  (1.651 m)  Wt 168 lb 14 oz (76.6 kg)  BMI 28.10 kg/m2  SpO2 96% Physical Exam  Constitutional: She is oriented to person, place, and time. She appears well-developed and well-nourished. No distress.  HENT:  Head: Normocephalic and atraumatic.  Right Ear: Hearing normal.  Left Ear: Hearing normal.  Nose: Nose normal.  Mouth/Throat: Oropharynx is clear and moist and mucous membranes are normal.  Eyes: Conjunctivae and EOM are normal. Pupils are equal, round, and reactive to light.  Neck: Normal range of motion. Neck supple.  Cardiovascular: Regular rhythm, S1 normal and S2 normal.  Exam reveals no gallop and no friction rub.   No murmur heard. Pulmonary/Chest: Effort normal. No respiratory distress. She has decreased breath sounds. She has rhonchi. She has rales. She exhibits no tenderness.  Abdominal: Soft.  Normal appearance and bowel sounds are normal. There is no hepatosplenomegaly. There is no tenderness. There is no rebound, no guarding, no tenderness at McBurney's point and negative Murphy's sign. No hernia.  Musculoskeletal: Normal range of motion.  Neurological: She is alert and oriented to person, place, and time. She has normal strength. No cranial nerve deficit or sensory deficit. Coordination normal. GCS eye subscore is 4. GCS verbal subscore is 5. GCS motor subscore is 6.  Skin: Skin is warm, dry and intact. No rash noted. No cyanosis.  Psychiatric: She has a normal mood and affect. Her speech is normal and behavior is normal. Thought content normal.  Nursing note and vitals reviewed.   ED Course  Procedures (including critical care time) Labs Review Labs Reviewed  CBC WITH DIFFERENTIAL/PLATELET - Abnormal; Notable for the following:    WBC 24.2 (*)    RBC 3.73 (*)    Hemoglobin 9.0 (*)    HCT 32.7 (*)    MCH 24.1 (*)    MCHC 27.5 (*)    RDW 17.9 (*)    Neutrophils Relative % 91 (*)    Neutro Abs 21.9 (*)    Lymphocytes Relative 1 (*)    Lymphs Abs 0.3 (*)    Monocytes Absolute 1.9 (*)    All other components within normal limits  COMPREHENSIVE METABOLIC PANEL - Abnormal; Notable for the following:    Chloride 91 (*)    CO2 38 (*)    Glucose, Bld 161 (*)    ALT 37 (*)    GFR calc non Af Amer 84 (*)    All other components within normal limits  TROPONIN I - Abnormal; Notable for the following:    Troponin I 0.19 (*)    All other components within normal limits  BRAIN NATRIURETIC PEPTIDE - Abnormal; Notable for the following:    B Natriuretic Peptide 294.0 (*)    All other components within normal limits  BLOOD GAS, ARTERIAL - Abnormal; Notable for the following:    pCO2 arterial 64.1 (*)    pO2, Arterial 72.6 (*)    Bicarbonate 35.2 (*)    Acid-Base Excess 9.6 (*)  All other components within normal limits  CLOSTRIDIUM DIFFICILE BY PCR  COMPREHENSIVE METABOLIC  PANEL  CBC  I-STAT CG4 LACTIC ACID, ED    Imaging Review Dg Chest Portable 1 View  04/26/2014   CLINICAL DATA:  Low oxygen saturation  EXAM: PORTABLE CHEST - 1 VIEW  COMPARISON:  04/19/2014  FINDINGS: Cardiac shadow remains enlarged. The degree of vascular congestion is improved when compared with the prior exam. A focal infiltrate is seen. No acute bony abnormality is noted.  IMPRESSION: No active disease.   Electronically Signed   By: Inez Catalina M.D.   On: 04/26/2014 14:24     EKG Interpretation   Date/Time:  Tuesday April 26 2014 13:35:47 EST Ventricular Rate:  92 PR Interval:  185 QRS Duration: 151 QT Interval:  449 QTC Calculation: 555 R Axis:   93 Text Interpretation:  Sinus rhythm Right bundle branch block Baseline  wander in lead(s) V1 V6 No significant change since last tracing Confirmed  by Rawn Quiroa  MD, [place] [phone]) on 04/26/2014 2:01:27 PM      MDM   Final diagnoses:  None   diarrhea  Generalized weakness  Patient presents to the ER for evaluation of generalized weakness secondary to diarrhea. Patient was recently hospitalized for pneumonia. She went home 4 days ago. Since then she has been having profuse watery diarrhea. C. difficile is considered. C. difficile PCR analysis is pending. She does have a significant leukocytosis. Cardiac evaluation reveals elevated troponin, but she did have significantly elevated troponin during her recent hospitalization. This appears to be trending downwards. ABG shows mild hypoxia with mild CO2 retention. Chest x-ray does not show any acute infiltrate, no overt congestive heart failure.  Patient appears ill. She is very weak. Blood pressure is borderline hypotensive. Although she does not have significant changes in her BUN and creatinine, she appears to have acute dehydration. She will be hospitalized for further management.   Orpah Greek, MD 04/26/14 2228

## 2014-04-26 NOTE — ED Notes (Signed)
Stool sample collected, but formed and soft. Not sent to lab for C. Diff culture due to this.

## 2014-04-26 NOTE — ED Notes (Signed)
RT called for ABG.

## 2014-04-26 NOTE — Telephone Encounter (Signed)
Patient contacted regarding discharge from [facility] on 04/23/14.  Patient understands to follow up with provider [name] [name] on 05/02/14 at 11:00 at [facility] office.. Patient understands discharge instructions? yes Patient understands medications and regiment? yes Patient understands to bring all medications to this visit? yes  She verbalizes understanding of new medications.  She is on O2 at 4 1/2 L.  States a home health nurse will be out starting today.  She understood that her appointment would be with [name] [name].  Advised that usually post discharge pt sees a PA initially and then follows up with physician.  She understands and will keep appointment on 05/02/14. No other questions and will call if has any questions.

## 2014-04-27 DIAGNOSIS — I471 Supraventricular tachycardia: Secondary | ICD-10-CM

## 2014-04-27 DIAGNOSIS — I472 Ventricular tachycardia: Secondary | ICD-10-CM

## 2014-04-27 DIAGNOSIS — R7989 Other specified abnormal findings of blood chemistry: Secondary | ICD-10-CM

## 2014-04-27 DIAGNOSIS — I252 Old myocardial infarction: Secondary | ICD-10-CM

## 2014-04-27 DIAGNOSIS — I255 Ischemic cardiomyopathy: Secondary | ICD-10-CM

## 2014-04-27 DIAGNOSIS — D649 Anemia, unspecified: Secondary | ICD-10-CM

## 2014-04-27 DIAGNOSIS — F419 Anxiety disorder, unspecified: Secondary | ICD-10-CM

## 2014-04-27 DIAGNOSIS — Z87898 Personal history of other specified conditions: Secondary | ICD-10-CM

## 2014-04-27 DIAGNOSIS — J438 Other emphysema: Secondary | ICD-10-CM

## 2014-04-27 DIAGNOSIS — Z955 Presence of coronary angioplasty implant and graft: Secondary | ICD-10-CM

## 2014-04-27 LAB — CBC
HEMATOCRIT: 27.7 % — AB (ref 36.0–46.0)
Hemoglobin: 7.6 g/dL — ABNORMAL LOW (ref 12.0–15.0)
MCH: 24.5 pg — ABNORMAL LOW (ref 26.0–34.0)
MCHC: 27.4 g/dL — ABNORMAL LOW (ref 30.0–36.0)
MCV: 89.4 fL (ref 78.0–100.0)
Platelets: 226 10*3/uL (ref 150–400)
RBC: 3.1 MIL/uL — AB (ref 3.87–5.11)
RDW: 18.2 % — AB (ref 11.5–15.5)
WBC: 17.1 10*3/uL — AB (ref 4.0–10.5)

## 2014-04-27 LAB — COMPREHENSIVE METABOLIC PANEL
ALK PHOS: 39 U/L (ref 39–117)
ALT: 29 U/L (ref 0–35)
AST: 24 U/L (ref 0–37)
Albumin: 3 g/dL — ABNORMAL LOW (ref 3.5–5.2)
Anion gap: 5 (ref 5–15)
BUN: 14 mg/dL (ref 6–23)
CHLORIDE: 97 mmol/L (ref 96–112)
CO2: 41 mmol/L (ref 19–32)
Calcium: 8.3 mg/dL — ABNORMAL LOW (ref 8.4–10.5)
Creatinine, Ser: 0.57 mg/dL (ref 0.50–1.10)
GFR calc non Af Amer: >90 mL/min (ref >90)
GLUCOSE: 114 mg/dL — AB (ref 70–99)
POTASSIUM: 3.8 mmol/L (ref 3.5–5.1)
Sodium: 143 mmol/L (ref 135–145)
Total Bilirubin: 0.8 mg/dL (ref 0.3–1.2)
Total Protein: 5.3 g/dL — ABNORMAL LOW (ref 6.0–8.3)

## 2014-04-27 LAB — TROPONIN I
Troponin I: 0.1 ng/mL — ABNORMAL HIGH (ref <0.031)
Troponin I: 0.08 ng/mL — ABNORMAL HIGH (ref <0.031)
Troponin I: 0.12 ng/mL — ABNORMAL HIGH (ref <0.031)

## 2014-04-27 LAB — TYPE AND SCREEN
ABO/RH(D): O POS
Antibody Screen: NEGATIVE

## 2014-04-27 LAB — CLOSTRIDIUM DIFFICILE BY PCR: Toxigenic C. Difficile by PCR: NEGATIVE

## 2014-04-27 MED ORDER — METRONIDAZOLE 500 MG PO TABS
500.0000 mg | ORAL_TABLET | Freq: Three times a day (TID) | ORAL | Status: DC
  Administered 2014-04-27 – 2014-04-29 (×8): 500 mg via ORAL
  Filled 2014-04-27 (×8): qty 1

## 2014-04-27 MED ORDER — LOPERAMIDE HCL 2 MG PO CAPS
2.0000 mg | ORAL_CAPSULE | Freq: Four times a day (QID) | ORAL | Status: DC | PRN
  Administered 2014-04-27 – 2014-04-29 (×5): 2 mg via ORAL
  Filled 2014-04-27 (×5): qty 1

## 2014-04-27 MED ORDER — LATANOPROST 0.005 % OP SOLN
OPHTHALMIC | Status: AC
  Filled 2014-04-27: qty 2.5

## 2014-04-27 MED ORDER — SODIUM CHLORIDE 0.9 % IV SOLN: INTRAVENOUS | Status: DC

## 2014-04-27 NOTE — Care Management Note (Addendum)
    Page 1 of 1   04/29/2014     11:45:18 AM CARE MANAGEMENT NOTE 04/29/2014  Patient:  [name] [name], [name] [name]   Account Number:  [id number]  Date Initiated:  04/27/2014  Documentation initiated by:  Jolene Provost  Subjective/Objective Assessment:   Pt is from home, lives with husband and admitted for dehydration, ? infection. Pt recently discharged from [facility] and has [place] services through [facility] and home O2 [place]. Pt previously refused SNF but is now agreeable. CSW aware and will see pt.     Action/Plan:   Will cont to follow.   Anticipated DC Date:  04/29/2014   Anticipated DC Plan:  SKILLED NURSING FACILITY  In-house referral  Clinical Social Worker      DC Planning Services  CM consult      Choice offered to / List presented to:             Status of service:  Completed, signed off Medicare Important Message given?  YES (If response is "NO", the following Medicare IM given date fields will be blank) Date Medicare IM given:  04/29/2014 Medicare IM given by:  Jolene Provost Date Additional Medicare IM given:   Additional Medicare IM given by:    Discharge Disposition:  [place]  Per UR Regulation:  Reviewed for med. necessity/level of care/duration of stay  If discussed at [place] of Stay Meetings, dates discussed:    Comments:  04/29/2014 [place], RN, MSN, CM Pt discharging home today to [facility]. CSW has arranged for placement, pt, pt's husband and RN are aware. No CM needs. 04/27/2014 [place], RN, MSN, CM

## 2014-04-27 NOTE — Consult Note (Signed)
Reason for Consult: Elevated troponin Referring Physician: PTH Cardiologist: [name] [name] is an 74 y.o. female.  HPI: This is a 74 year old female patient of [name] [name] who was admitted with diarrhea and found to have an elevated troponin. Patient was just discharged 4 days ago from [facility] after an admission with congestive heart failure and non-STEMI. She had been on antibiotics for pneumonia.  She had been seen in the office on 04/15/14 by [name] [name] who recommended hospitalization for acute on chronic heart failure. The patient declined that and when she left the office she developed chest pain and called EMS who took her to the hospital. She underwent cardiac catheterization with successful rotational atherectomy and stenting of the left circumflex. She was treated with Plavix and aspirin. She has residual totally occluded ostial LAD with left to left collaterals, small nondominant RCA, a large complex abdominal aortic aneurysm with severe distal aortic disease and probable high-grade ostial disease in the bilateral iliacs. 2-D echo EF 40-45%. Patient's troponins at [facility] hospital were your 0.32, 0.57, and 0.35. They were checked here and were 0.19 and 0.10. EKG shows normal sinus rhythm with right bundle branch block no acute change.  Patient also has history of SVT treated with amiodarone, hypertension, COPD, bladder cancer, and cerebrovascular disease. She has history of a ventricular tachycardia in 2012 that was evaluated by Dr. Lovena Le who recommended beta blocker therapy. She's had an unsuccessful attempt at stenting of the carotid on the left.  Patient said she had some chest discomfort but it was positional from the way she was lain in the bed. She denies any chest tightness or pressure. Her main complaint is nonstop diarrhea, she feels dehydrated and is having bladder pain and difficulty urinating. Hemoglobin is down to 7.6 white count was 24,000 but is down to 17,000.  She is on steroids.   Past Medical History  Diagnosis Date  . Hypertension   . Hyperlipidemia   . Cerebrovascular disease, unspecified   . Unspecified glaucoma   . Obesity, unspecified   . Coronary artery disease     cath 04/06/10: LAD occluded with R-L collats; med Rx WTC....probable V. Tach...med Rx  . Ventricular tachycardia   . SVT (supraventricular tachycardia)   . AAA (abdominal aortic aneurysm)   . COPD (chronic obstructive pulmonary disease) 2013    [place] of breath     WITH ACTIVITY--USES OXYGEN AS NEEDED  2&1/2 L PER NASAL CANNUL  . Complication of anesthesia     PT STATES SHE CAN NOT BE PUT TO SLEEP BECAUSE OF HER LUNG PROBLEMS  . Carotid artery occlusion   . Anemia   . CHF (congestive heart failure)   . Abdominal aneurysm     "hasn't been repaired" (10/20/2013)  . Pneumonia X ~ 2  . On home oxygen therapy     "4L; 24/7" (10/20/2013)  . GERD (gastroesophageal reflux disease)   . Migraines     "before tubal ligation I had them alot; seldom have one anymore" (10/20/2013)  . Arthritis     "knees mainly" (10/20/2013)  . Bladder cancer 2009  . Atrial fibrillation   . Bell palsy 2014    Past Surgical History  Procedure Laterality Date  . Cystoscopy    . Bladder tumor excision      resection of 5 bladder, and fulguration of 1 small bladder tumor  . Cystoscopy      cold cup bladder biopsy of five tumors, and  fulguration of one tumor  . Wedge resection Right 1980's    Remote right lung  . Cystoscopy with biopsy  12/24/2011    Procedure: CYSTOSCOPY WITH BIOPSY;  Surgeon: Fredricka Bonine, MD;  Location: [facility] OR[facility];  Service: Urology;  Laterality: N/A;  . Tubal ligation Bilateral 1980's  . Cataract extraction w/ intraocular lens  implant, bilateral Bilateral ~ 2011  . Cardiac catheterization  "several"    "too bad to put stents in"  . Left heart catheterization with coronary angiogram N/A 04/18/2014    Procedure: LEFT HEART CATHETERIZATION WITH  CORONARY ANGIOGRAM;  Surgeon: Jolaine Artist, MD;  Location: [facility] CATH LAB;  Service: Cardiovascular;  Laterality: N/A;  . Percutaneous coronary rotoblator intervention (pci-r) N/A 04/21/2014    Procedure: PERCUTANEOUS CORONARY ROTOBLATOR INTERVENTION (PCI-R);  Surgeon: Blane Ohara, MD;  Location: [facility] CATH LAB;  Service: Cardiovascular;  Laterality: N/A;    Family History  Problem Relation Age of Onset  . Heart disease Mother 87  . Heart disease Father   . Hyperlipidemia Father   . Hypertension Father   . Diabetes Daughter   . Peripheral vascular disease Daughter     Social History:  reports that she quit smoking about 2 years ago. Her smoking use included Cigarettes. She has a 100 pack-year smoking history. She has never used smokeless tobacco. She reports that she does not drink alcohol or use illicit drugs.  Allergies:  Allergies  Allergen Reactions  . Albuterol Other (See Comments)    Jittery/shakiness  . Levaquin [Levofloxacin In D5w] Nausea Only  . Sulfa Antibiotics Nausea Only    Medications:  Scheduled Meds: . amiodarone  200 mg Oral QHS  . arformoterol  15 mcg Nebulization BID  . aspirin EC  81 mg Oral QHS  . azithromycin  500 mg Oral Daily  . clopidogrel  75 mg Oral QHS  . diltiazem  300 mg Oral Daily  . famotidine  20 mg Oral Daily  . furosemide  40 mg Oral Daily  . heparin  5,000 Units Subcutaneous 3 times per day  . latanoprost  1 drop Both Eyes QHS  . metroNIDAZOLE  500 mg Oral 3 times per day  . nystatin  5 mL Oral QID  . pantoprazole  40 mg Oral Daily  . potassium chloride SA  40 mEq Oral Daily  . pravastatin  80 mg Oral QHS  . predniSONE  5 mg Oral Q breakfast   Continuous Infusions:  PRN Meds:.acetaminophen, guaiFENesin-dextromethorphan, ipratropium, levalbuterol, loperamide, nitroGLYCERIN, [DISCONTINUED] ondansetron **OR** ondansetron (ZOFRAN) IV   Results for orders placed or performed during the hospital encounter of 04/26/14 (from the past  48 hour(s))  CBC with Differential     Status: Abnormal   Collection Time: 04/26/14  1:58 PM  Result Value Ref Range   WBC 24.2 (H) 4.0 - 10.5 K/uL   RBC 3.73 (L) 3.87 - 5.11 MIL/uL   Hemoglobin 9.0 (L) 12.0 - 15.0 g/dL   HCT 32.7 (L) 36.0 - 46.0 %   MCV 87.7 78.0 - 100.0 fL   MCH 24.1 (L) 26.0 - 34.0 pg   MCHC 27.5 (L) 30.0 - 36.0 g/dL   RDW 17.9 (H) 11.5 - 15.5 %   Platelets LARGE PLATELETS PRESENT 150 - 400 K/uL    Comment: SPECIMEN CHECKED FOR CLOTS   Neutrophils Relative % 91 (H) 43 - 77 %   Neutro Abs 21.9 (H) 1.7 - 7.7 K/uL   Lymphocytes Relative 1 (L) 12 - 46 %  Lymphs Abs 0.3 (L) 0.7 - 4.0 K/uL   Monocytes Relative 8 3 - 12 %   Monocytes Absolute 1.9 (H) 0.1 - 1.0 K/uL   Eosinophils Relative 0 0 - 5 %   Eosinophils Absolute 0.0 0.0 - 0.7 K/uL   Basophils Relative 0 0 - 1 %   Basophils Absolute 0.1 0.0 - 0.1 K/uL   WBC Morphology MILD LEFT SHIFT (1-5% METAS, OCC MYELO, OCC BANDS)    RBC Morphology POLYCHROMASIA PRESENT   Comprehensive metabolic panel     Status: Abnormal   Collection Time: 04/26/14  1:58 PM  Result Value Ref Range   Sodium 138 135 - 145 mmol/L   Potassium 3.9 3.5 - 5.1 mmol/L   Chloride 91 (L) 96 - 112 mmol/L   CO2 38 (H) 19 - 32 mmol/L   Glucose, Bld 161 (H) 70 - 99 mg/dL   BUN 17 6 - 23 mg/dL   Creatinine, Ser 0.72 0.50 - 1.10 mg/dL   Calcium 8.5 8.4 - 10.5 mg/dL   Total Protein 6.5 6.0 - 8.3 g/dL   Albumin 3.7 3.5 - 5.2 g/dL   AST 30 0 - 37 U/L   ALT 37 (H) 0 - 35 U/L   Alkaline Phosphatase 47 39 - 117 U/L   Total Bilirubin 1.1 0.3 - 1.2 mg/dL   GFR calc non Af Amer 84 (L) >90 mL/min   GFR calc Af Amer >90 >90 mL/min    Comment: (NOTE) The eGFR has been calculated using the CKD EPI equation. This calculation has not been validated in all clinical situations. eGFR's persistently <90 mL/min signify possible Chronic Kidney Disease.    Anion gap 9 5 - 15  Troponin I     Status: Abnormal   Collection Time: 04/26/14  1:58 PM  Result Value  Ref Range   Troponin I 0.19 (H) <0.031 ng/mL    Comment:        PERSISTENTLY INCREASED TROPONIN VALUES IN THE RANGE OF 0.04-0.49 ng/mL CAN BE SEEN IN:       -UNSTABLE ANGINA       -CONGESTIVE HEART FAILURE       -MYOCARDITIS       -CHEST TRAUMA       -ARRYHTHMIAS       -LATE PRESENTING MYOCARDIAL INFARCTION       -COPD   CLINICAL FOLLOW-UP RECOMMENDED.   Brain natriuretic peptide     Status: Abnormal   Collection Time: 04/26/14  1:58 PM  Result Value Ref Range   B Natriuretic Peptide 294.0 (H) 0.0 - 100.0 pg/mL  Clostridium Difficile by PCR     Status: None   Collection Time: 04/26/14  2:21 PM  Result Value Ref Range   C difficile by pcr NEGATIVE NEGATIVE  Blood gas, arterial ([facility] & [facility] ONLY)     Status: Abnormal   Collection Time: 04/26/14  4:02 PM  Result Value Ref Range   O2 Content 5.0 L/min   pH, Arterial 7.359 7.350 - 7.450   pCO2 arterial 64.1 (HH) 35.0 - 45.0 mmHg    Comment: CRITICAL RESULT CALLED TO, READ BACK BY AND VERIFIED WITH: TIFFANY OSBORNE RN BY ROBIN POWELL RRT ON 04/26/14 AT 1604    pO2, Arterial 72.6 (L) 80.0 - 100.0 mmHg   Bicarbonate 35.2 (H) 20.0 - 24.0 mEq/L   TCO2 33.4 0 - 100 mmol/L   Acid-Base Excess 9.6 (H) 0.0 - 2.0 mmol/L   O2 Saturation 91.9 %   Patient temperature 37.0  Collection site LEFT BRACHIAL    Drawn by [id number]    Sample type ARTERIAL    Allens test (pass/fail) PASS PASS  Comprehensive metabolic panel     Status: Abnormal   Collection Time: 04/27/14  6:28 AM  Result Value Ref Range   Sodium 143 135 - 145 mmol/L   Potassium 3.8 3.5 - 5.1 mmol/L   Chloride 97 96 - 112 mmol/L   CO2 41 (HH) 19 - 32 mmol/L    Comment: CRITICAL RESULT CALLED TO, READ BACK BY AND VERIFIED WITH: CHAPELL,R AT 7:20AM ON 04/27/14 BY FESTERMAN,C    Glucose, Bld 114 (H) 70 - 99 mg/dL   BUN 14 6 - 23 mg/dL   Creatinine, Ser 0.57 0.50 - 1.10 mg/dL   Calcium 8.3 (L) 8.4 - 10.5 mg/dL   Total Protein 5.3 (L) 6.0 - 8.3 g/dL   Albumin 3.0 (L) 3.5 - 5.2 g/dL    AST 24 0 - 37 U/L   ALT 29 0 - 35 U/L   Alkaline Phosphatase 39 39 - 117 U/L   Total Bilirubin 0.8 0.3 - 1.2 mg/dL   GFR calc non Af Amer >90 >90 mL/min   GFR calc Af Amer >90 >90 mL/min    Comment: (NOTE) The eGFR has been calculated using the CKD EPI equation. This calculation has not been validated in all clinical situations. eGFR's persistently <90 mL/min signify possible Chronic Kidney Disease.    Anion gap 5 5 - 15  CBC     Status: Abnormal   Collection Time: 04/27/14  6:28 AM  Result Value Ref Range   WBC 17.1 (H) 4.0 - 10.5 K/uL   RBC 3.10 (L) 3.87 - 5.11 MIL/uL   Hemoglobin 7.6 (L) 12.0 - 15.0 g/dL   HCT 27.7 (L) 36.0 - 46.0 %   MCV 89.4 78.0 - 100.0 fL   MCH 24.5 (L) 26.0 - 34.0 pg   MCHC 27.4 (L) 30.0 - 36.0 g/dL   RDW 18.2 (H) 11.5 - 15.5 %   Platelets 226 150 - 400 K/uL  Troponin I (q 6hr x 3)     Status: Abnormal   Collection Time: 04/27/14  9:51 AM  Result Value Ref Range   Troponin I 0.10 (H) <0.031 ng/mL    Comment:        PERSISTENTLY INCREASED TROPONIN VALUES IN THE RANGE OF 0.04-0.49 ng/mL CAN BE SEEN IN:       -UNSTABLE ANGINA       -CONGESTIVE HEART FAILURE       -MYOCARDITIS       -CHEST TRAUMA       -ARRYHTHMIAS       -LATE PRESENTING MYOCARDIAL INFARCTION       -COPD   CLINICAL FOLLOW-UP RECOMMENDED.     Dg Chest Portable 1 View  04/26/2014   CLINICAL DATA:  Low oxygen saturation  EXAM: PORTABLE CHEST - 1 VIEW  COMPARISON:  04/19/2014  FINDINGS: Cardiac shadow remains enlarged. The degree of vascular congestion is improved when compared with the prior exam. A focal infiltrate is seen. No acute bony abnormality is noted.  IMPRESSION: No active disease.   Electronically Signed   By: Inez Catalina M.D.   On: 04/26/2014 14:24    ROS  See HPI Eyes: Negative Ears: Positive for hearing loss, negative tinnitus Cardiovascular: Positive for chronic dyspnea, Negative for chest pain, palpitations,irregular heartbeat,  near-syncope, orthopnea, paroxysmal  nocturnal dyspnea and syncope,edema, claudication, cyanosis,.  Respiratory:   Negative for cough,  hemoptysis,  sleep disturbances due to breathing, sputum production and wheezing.   Endocrine: Negative for cold intolerance and heat intolerance.  Hematologic/Lymphatic: Negative for adenopathy and bleeding problem. Does not bruise/bleed easily.  Musculoskeletal: Negative.   Gastrointestinal: Positive for nausea, abdominal pain, diarrhea, negative for vomiting or constipation.   Genitourinary: History of bladder CA with recurrent bladder pain and difficulty urinating  Neurological: Negative.  Allergic/Immunologic: Negative for environmental allergies.  Blood pressure 125/58, pulse 77, temperature 98.5 F (36.9 C), temperature source Oral, resp. rate 20, height $RemoveBe'5\' 5"'yuUoWtFSO$  (1.651 m), weight 168 lb 14 oz (76.6 kg), SpO2 96 %. Physical Exam PHYSICAL EXAM: Well-nournished, uncomfortable lying in bed. Neck: No JVD, HJR, Bruit, or thyroid enlargement Lungs: Decreased breath sounds but No tachypnea, clear without wheezing, rales, or rhonchi Cardiovascular: RRR, PMI not displaced, heart sounds distanturmurs, gallops, bruit, thrill, or heave. Abdomen: BS normal. Soft without organomegaly, masses, lesions or tenderness. Extremities:  right groin cath site is bruised but no hematoma or hemorrhage good distal pulses otherwise lower extremities without cyanosis, clubbing or edema. Good distal pulses bilateral SKin: Warm, no lesions or rashes  Musculoskeletal: No deformities Neuro: no focal signs    Assessment/Plan: Elevated troponins actually trending down from recent rotational atherectomy and stenting of the left circumflex in 04/21/14. EKG unchanged and patient has not had any angina. Continue medical therapy. Follow-up with Dr. Jacalyn Lefevre outpatient.   CAD status post successful rotational atherectomy and stenting of the left circumflex, residual totally occluded LAD severe PAD and complex abnormal aortic  aneurysm.  LV dysfunction EF 40-45%  Large complex abdominal aortic aneurysm with severe distal aortic disease and probably high-grade ostial disease in the bilateral iliacs  Diarrhea rule out C. difficile on antibiotics for recent pneumonia  COPD  History of SVT on amiodarone( Can't find any history of Atrial fibrillation)  History of nonsustained V. tach treated with beta blockers  History of bladder CA  History of CVA    [name] [name] 04/27/2014, 12:38 PM     The patient was seen and examined, and I agree with the assessment and plan as documented above, with modifications as noted below. Pt with aforementioned cardiovascular history with recent NSTEMI and rotational atherectomy with DES to dominant left circumflex on 2/25, occluded LAD, and PVD (iliac disease, AAA) admitted with diarrhea and dehydration. Troponins checked and found to be mildly elevated, but actually trending down as would be expected after NSTEMI and complex coronary intervention performed 6 days ago. Admitted with diarrhea and dehydration. C diff negative. Possible viral gastroenteritis. Pt currently denies chest pain and says she feels better after Foley catheter was placed and she voided 900 cc.  Would continue dual antiplatelet therapy along with statin therapy. No beta blockers due to severe COPD. Has chronic anemia. Also has history of SVT and nonsustained ventricular tachycardia and is on both amiodarone and diltiazem. No history of atrial fibrillation. ECG showed both sinus tachycardia and sinus rhythm with an underlying right bundle branch block. No additional recommendations at this time. Will sign off. Please call with questions.

## 2014-04-27 NOTE — Clinical Social Work Psychosocial (Signed)
Clinical Social Work Department BRIEF PSYCHOSOCIAL ASSESSMENT 04/27/2014  Patient:  [name] [name], [name] [name]     Account Number:  [id number]     Admit date:  04/26/2014  Clinical Social Worker:  Legrand Como  Date/Time:  04/27/2014 02:14 PM  Referred by:  CSW  Date Referred:  04/27/2014 Referred for  SNF Placement   Other Referral:   Interview type:  Patient Other interview type:    PSYCHOSOCIAL DATA Living Status:  HUSBAND Admitted from facility:   Level of care:   Primary support name:  [name] [name] Primary support relationship to patient:  SPOUSE Degree of support available:   Patient reports that her husband is very supportive.    CURRENT CONCERNS Current Concerns  Post-Acute Placement   Other Concerns:    SOCIAL WORK ASSESSMENT / PLAN CSW met with patient who was awake and oriented.  Patient indicated that she lives with her husband.  She stated that at baseline (prior to her hospitalization at [facility]) she ambulated unassisted and required minimal assistance with ADLs.  Patient indicated that her husband would assist her when she needed assistance with things such as washing her back.  She indicated that she mainly performed ADL's unassisted.  Patient indicated that she has a relative/friend named [name] [name], who also assist patient with things such as taking her to the doctor or going to the grocery store.  CSW discussed SNF with patient and provided at [facility] listing.  Patient verbalized disappointment and being emotionally upset by needing to go to SNF due to how week she has gotten.  CSW validated patient's disappointment. CSW provided supportive counseling and encouragement related to going to SNF to get back to her baseline of independence.  Patient was agreeable and indicated that her first choice in SNF would be [facility], followed by [facility] then [facility].   Assessment/plan status:  Information/Referral to [employer] Other assessment/ plan:    Information/referral to community resources:    PATIENT'S/FAMILY'S RESPONSE TO PLAN OF CARE: Patient is disappointment about not being at her baseline. Patient reports that while she was at [facility] she expected to be released and go back to baseline.  Patient indicated that when she was released from [facility] she began having diarrhea and it weakened her. Patient is agreeable to go to SNF.   [name] [name], [name] [name]

## 2014-04-27 NOTE — Progress Notes (Signed)
Patient c/o abdomen discomfort. Patient states she is not emptying her bladder states she is dribbling. Bladder scan patient retaining 950cc. Foley catheter inserted 1200cc amber urine noted. Patient states she feels better since foley catheter inserted. Incont of stool and urine today.

## 2014-04-27 NOTE — Care Management Utilization Note (Signed)
UR completed 

## 2014-04-27 NOTE — Progress Notes (Signed)
Patient Demographics  [name] [name], is a 74 y.o. female, DOB - [birth year], LEX:[id number]  Admit date - 04/26/2014   Admitting Physician Doree Albee, MD  Outpatient Primary MD for the patient is Sherrie Mustache, MD  LOS - 1   Chief Complaint  Patient presents with  . Diarrhea        Subjective:   [name] [name] today has, No headache, No chest pain, No abdominal pain - No Nausea, No new weakness tingling or numbness, No Cough - SOB.   Assessment & Plan    1. Diarrhea. Could be viral gastroenteritis. C. difficile negative. Will place on Imodium as needed along with Flagyl till stool cultures are final. Leukocytosis improving patient feels better. Supportive care.   2. CAD. Recent post coronary stenting with drug-eluting stent to left circumflex few weeks ago at [facility]. Continue dual antiplatelet treatment along with statin for secondary prevention. Mild rise in troponin which is a non-ACS pattern, no chest pain, requested cardiology to evaluate. This likely due to underlying stress from diarrhea and dehydration.   3. COPD. Currently undergoing prednisone taper continue, on 4 L nasal cannula oxygen continue, counseled to quit smoking. Continue supportive care.   4. Dyslipidemia. Continue statin.   5. Chronic atrial fibrillation [place] VAsc 2 - 6 - currently on amiodarone, Cardizem along with dual antiplatelet treatment. Currently not on anticoagulation. We'll defer to cardiology plan on anticoagulation considering she is on dual antiplatelet treatment .    6. 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 Currently compensated stop IV fluids. Not on beta blocker due to severe COPD.   7.GERD. Continue PPI.     8. Hx straight of stroke. On aspirin and Plavix and  statin. No acute problems.     9. Anemia. Has underlying anemia of chronic disease along with dilution from IV fluids, hold IV fluids, type screen, repeat H&H in the morning.      Code Status: Full  Family Communication: husband  Disposition Plan: HHPT   Procedures     Consults Cards   Medications  Scheduled Meds: . amiodarone  200 mg Oral QHS  . arformoterol  15 mcg Nebulization BID  . aspirin EC  81 mg Oral QHS  . azithromycin  500 mg Oral Daily  . clopidogrel  75 mg Oral QHS  . diltiazem  300 mg Oral Daily  . famotidine  20 mg Oral Daily  . furosemide  40 mg Oral Daily  . heparin  5,000 Units Subcutaneous 3 times per day  . latanoprost  1 drop Both Eyes QHS  . metroNIDAZOLE  500 mg Oral 3 times per day  . nystatin  5 mL Oral QID  . pantoprazole  40 mg Oral Daily  . potassium chloride SA  40 mEq Oral Daily  . pravastatin  80 mg Oral QHS  . predniSONE  5 mg Oral Q breakfast   Continuous Infusions:  PRN Meds:.acetaminophen, guaiFENesin-dextromethorphan, ipratropium, levalbuterol, nitroGLYCERIN, [DISCONTINUED] ondansetron **OR** ondansetron (ZOFRAN) IV  DVT Prophylaxis   Heparin   Lab Results  Component Value Date   PLT 226 04/27/2014    Antibiotics    Anti-infectives    Start     Dose/Rate Route Frequency Ordered Stop  04/27/14 1000  azithromycin (ZITHROMAX) tablet 500 mg     500 mg Oral Daily 04/26/14 1851     04/27/14 0800  metroNIDAZOLE (FLAGYL) tablet 500 mg     500 mg Oral 3 times per day 04/27/14 0757            Objective:   Filed Vitals:   04/26/14 1836 04/26/14 2157 04/27/14 0558 04/27/14 1152  BP: 122/44 118/49 125/58   Pulse: 81 80 77   Temp: 98 F (36.7 C) 98.2 F (36.8 C) 98.5 F (36.9 C)   TempSrc: Oral Oral Oral   Resp: 24 20 20    Height: 5\' 5"  (1.651 m)     Weight: 76.6 kg (168 lb 14 oz)     SpO2: 100% 96% 95% 96%    Wt Readings from Last 3 Encounters:  04/26/14 76.6 kg (168 lb 14 oz)  04/23/14 78.2 kg (172 lb 6.4  oz)  12/29/13 81.194 kg (179 lb)     Intake/Output Summary (Last 24 hours) at 04/27/14 1210 Last data filed at 04/27/14 0558  Gross per 24 hour  Intake      0 ml  Output    200 ml  Net   -200 ml     Physical Exam  Awake Alert, Oriented X 3, No new F.N deficits, Normal affect [place].AT,PERRAL Supple Neck,No JVD, No cervical lymphadenopathy appriciated.  Symmetrical Chest wall movement, Mod air movement bilaterally, few wheezes RRR,No Gallops,Rubs or new Murmurs, No Parasternal Heave +ve B.Sounds, Abd Soft, No tenderness, No organomegaly appriciated, No rebound - guarding or rigidity. No Cyanosis, Clubbing or edema, No new Rash or bruise     Data Review   Micro Results Recent Results (from the past 240 hour(s))  MRSA PCR Screening     Status: None   Collection Time: 04/19/14 11:29 AM  Result Value Ref Range Status   MRSA by PCR NEGATIVE NEGATIVE Final    Comment:        The GeneXpert MRSA Assay (FDA approved for NASAL specimens only), is one component of a comprehensive MRSA colonization surveillance program. It is not intended to diagnose MRSA infection nor to guide or monitor treatment for MRSA infections.   Clostridium Difficile by PCR     Status: None   Collection Time: 04/26/14  2:21 PM  Result Value Ref Range Status   C difficile by pcr NEGATIVE NEGATIVE Final    Radiology Reports Dg Chest 1 View  04/15/2014   CLINICAL DATA:  74 year old female with a history of shortness of breath, hypertension  EXAM: CHEST  1 VIEW  COMPARISON:  11/11/2013  FINDINGS: Cardiomediastinal silhouette unchanged in size and contour with cardiomegaly.  Atherosclerotic calcifications of the aortic arch.  No evidence of pulmonary vascular congestion.  No pneumothorax.  Persistent opacity at the base of the right lung, present on comparison plain film studies.  Retrocardiac region not well evaluated.  No definite confluent airspace disease.  No displaced fracture.  IMPRESSION: No evidence of  pulmonary edema, with stable cardiomegaly and atherosclerotic changes.  Retrocardiac region not well visualized, and if there is were further concern for acute process, a dedicated PA and lateral chest x-ray would be useful.  Persistent opacity at the right base, likely reflective of atelectasis and/ or scarring. If there were a need to further characterize the finding a CT would be required.  Signed,  Dulcy Fanny. Earleen Newport, DO  [facility]  [facility]   Electronically Signed   By:  Corrie Mckusick D.O.   On: 04/15/2014 17:09   Dg Chest Portable 1 View  04/26/2014   CLINICAL DATA:  Low oxygen saturation  EXAM: PORTABLE CHEST - 1 VIEW  COMPARISON:  04/19/2014  FINDINGS: Cardiac shadow remains enlarged. The degree of vascular congestion is improved when compared with the prior exam. A focal infiltrate is seen. No acute bony abnormality is noted.  IMPRESSION: No active disease.   Electronically Signed   By: Inez Catalina M.D.   On: 04/26/2014 14:24   Dg Chest Port 1 View  04/19/2014   CLINICAL DATA:  Shortness of breath, weakness and respiratory distress for 1 day.  EXAM: PORTABLE CHEST - 1 VIEW  COMPARISON:  04/15/2014  FINDINGS: Stable cardiomegaly without definite edema or CHF. Limited exam because of positioning. Ill-defined increased bibasilar opacities concerning for basilar atelectasis and/or developing airspace disease. Left effusion not excluded. No pneumothorax. Trachea midline. Atherosclerosis of the aorta. Background emphysema suspected.  IMPRESSION: Stable cardiomegaly without CHF  Ill-defined bibasilar atelectasis versus developing airspace disease  Suspect small left effusion.   Electronically Signed   By: Jerilynn Mages.  Shick M.D.   On: 04/19/2014 16:12   Dg Chest Port 1 View  04/15/2014   CLINICAL DATA:  Chest pain and difficulty breathing acute onset  EXAM: PORTABLE CHEST - 1 VIEW  COMPARISON:  Study obtained earlier in the day; November 11, 2013  FINDINGS: There is  underlying emphysematous change. There is chronic pleural thickening on the right which appears stable. There is no frank edema or consolidation. Heart is enlarged with pulmonary vascularity within normal limits. There is atherosclerotic change in the aorta. No adenopathy. No bone lesions.  IMPRESSION: Stable cardiomegaly. Chronic pleural thickening on the right. Underlying emphysema. No frank edema or consolidation.   Electronically Signed   By: Lowella Grip III M.D.   On: 04/15/2014 14:49     CBC  Recent Labs Lab 04/21/14 0240 04/21/14 1630 04/22/14 0333 04/26/14 1358 04/27/14 0628  WBC 8.7 13.5* 11.4* 24.2* 17.1*  HGB 8.6* 8.6* 8.5* 9.0* 7.6*  HCT 30.6* 30.8* 30.5* 32.7* 27.7*  PLT 263 292 258 LARGE PLATELETS PRESENT 226  MCV 83.4 83.2 83.3 87.7 89.4  MCH 23.4* 23.2* 23.2* 24.1* 24.5*  MCHC 28.1* 27.9* 27.9* 27.5* 27.4*  RDW 16.7* 16.8* 16.8* 17.9* 18.2*  LYMPHSABS  --   --   --  0.3*  --   MONOABS  --   --   --  1.9*  --   EOSABS  --   --   --  0.0  --   BASOSABS  --   --   --  0.1  --     Chemistries   Recent Labs Lab 04/21/14 0240 04/21/14 1630 04/22/14 0333 04/23/14 0321 04/26/14 1358 04/27/14 0628  NA 141  --  137 138 138 143  K 3.4*  --  3.6 3.8 3.9 3.8  CL 85*  --  85* 85* 91* 97  CO2 45*  --  44* 44* 38* 41*  GLUCOSE 158*  --  164* 109* 161* 114*  BUN 20  --  23 21 17 14   CREATININE 0.63 0.62 0.62 0.59 0.72 0.57  CALCIUM 8.9  --  8.5 8.5 8.5 8.3*  AST  --   --   --  29 30 24   ALT  --   --   --  34 37* 29  ALKPHOS  --   --   --  37* 47 39  BILITOT  --   --   --  0.6 1.1 0.8   ------------------------------------------------------------------------------------------------------------------ estimated creatinine clearance is 65 mL/min (by C-G formula based on Cr of 0.57). ------------------------------------------------------------------------------------------------------------------ No results for input(s): HGBA1C in the last 72  hours. ------------------------------------------------------------------------------------------------------------------ No results for input(s): CHOL, HDL, LDLCALC, TRIG, CHOLHDL, LDLDIRECT in the last 72 hours. ------------------------------------------------------------------------------------------------------------------ No results for input(s): TSH, T4TOTAL, T3FREE, THYROIDAB in the last 72 hours.  Invalid input(s): FREET3 ------------------------------------------------------------------------------------------------------------------ No results for input(s): VITAMINB12, FOLATE, FERRITIN, TIBC, IRON, RETICCTPCT in the last 72 hours.  Coagulation profile No results for input(s): INR, PROTIME in the last 168 hours.  No results for input(s): DDIMER in the last 72 hours.  Cardiac Enzymes  Recent Labs Lab 04/26/14 1358 04/27/14 0951  TROPONINI 0.19* 0.10*   ------------------------------------------------------------------------------------------------------------------ Invalid input(s): POCBNP     Time Spent in minutes   35   Frederic Tones K M.D on 04/27/2014 at 12:10 PM  Between 7am to 7pm - Pager - [phone]  After 7pm go to [email or web address] - password [facility]  [facility]   Office  [phone]

## 2014-04-27 NOTE — Plan of Care (Signed)
Problem: Phase I Progression Outcomes Goal: Voiding-avoid urinary catheter unless indicated Outcome: Not Applicable Date Met:  45/99/77 Foley inserted patient c/o discomfort.

## 2014-04-28 LAB — BASIC METABOLIC PANEL
Anion gap: 8 (ref 5–15)
BUN: 12 mg/dL (ref 6–23)
CO2: 39 mmol/L — ABNORMAL HIGH (ref 19–32)
CREATININE: 0.64 mg/dL (ref 0.50–1.10)
Calcium: 8.5 mg/dL (ref 8.4–10.5)
Chloride: 94 mmol/L — ABNORMAL LOW (ref 96–112)
GFR, EST NON AFRICAN AMERICAN: 87 mL/min — AB (ref >90)
Glucose, Bld: 111 mg/dL — ABNORMAL HIGH (ref 70–99)
POTASSIUM: 3.4 mmol/L — AB (ref 3.5–5.1)
Sodium: 141 mmol/L (ref 135–145)

## 2014-04-28 LAB — CBC
HEMATOCRIT: 26.3 % — AB (ref 36.0–46.0)
HEMOGLOBIN: 7.2 g/dL — AB (ref 12.0–15.0)
MCH: 24.3 pg — AB (ref 26.0–34.0)
MCHC: 27.4 g/dL — AB (ref 30.0–36.0)
MCV: 88.9 fL (ref 78.0–100.0)
Platelets: 210 10*3/uL (ref 150–400)
RBC: 2.96 MIL/uL — AB (ref 3.87–5.11)
RDW: 18.3 % — ABNORMAL HIGH (ref 11.5–15.5)
WBC: 15.3 10*3/uL — ABNORMAL HIGH (ref 4.0–10.5)

## 2014-04-28 MED ORDER — TAMSULOSIN HCL 0.4 MG PO CAPS
0.4000 mg | ORAL_CAPSULE | Freq: Every day | ORAL | Status: DC
  Administered 2014-04-28 – 2014-04-29 (×2): 0.4 mg via ORAL
  Filled 2014-04-28 (×2): qty 1

## 2014-04-28 MED ORDER — FUROSEMIDE 10 MG/ML IJ SOLN
20.0000 mg | Freq: Once | INTRAMUSCULAR | Status: AC
  Administered 2014-04-28: 20 mg via INTRAVENOUS
  Filled 2014-04-28: qty 2

## 2014-04-28 MED ORDER — POTASSIUM CHLORIDE CRYS ER 20 MEQ PO TBCR
40.0000 meq | EXTENDED_RELEASE_TABLET | Freq: Once | ORAL | Status: AC
  Administered 2014-04-28: 40 meq via ORAL
  Filled 2014-04-28: qty 2

## 2014-04-28 NOTE — Plan of Care (Signed)
Problem: Phase I Progression Outcomes Goal: OOB as tolerated unless otherwise ordered Outcome: Progressing Patient anxious at times

## 2014-04-28 NOTE — Evaluation (Signed)
Physical Therapy Evaluation Patient Details Name: [name] [name] MRN: [id number] DOB: [birth year] Today's Date: 04/28/2014   History of Present Illness  This is a 74 year old lady who gives a four-day history of diarrhea. She was recently discharged approximately 4 days ago from [facility]. She was admitted there because of dyspnea. It was felt this was multifactorial, COPD and exacerbation of CHF. She apparently spent time with BiPAP. She also underwent cardiac catheterization and had PCI done. It seems that she left the hospital on tapering dose of prednisone and antibiotics. She had been on antibiotics during the hospitalization. She has noticed that she has had several episodes of diarrhea although it seems to be easing off with the stool becoming more formed in the last 24 hours or so. She has had no vomiting or abdominal pain. There is no hematemesis or any rectal bleeding. She denies any obvious fever. Evaluation in the emergency room showed her to be clinically dehydrated although electrolytes were acceptable. She has become rather weak and she is going to be admitted for further management. There was concern about C. difficile colitis.  Clinical Impression   Pt was seen for evaluation.  She was alert and cooperative but extremely anxious.  She was found to be deconditioned and now is unable to ambulate.   Pt was able to transfer OOB and use a walker to pivot to a chair.  She required a great deal of time and reassurance prior to doing any activity with me.  She does best if no staff "pushes or pulls on me".  She does not wish to be "forced to do something that I know I can't do".    Follow Up Recommendations SNF    Equipment Recommendations  None recommended by PT    Recommendations for Other Services  OT     Precautions / Restrictions Precautions Precautions: Fall Restrictions Weight Bearing Restrictions: No      Mobility  Bed Mobility Overal bed mobility: Modified  Independent Bed Mobility: Supine to Sit     Supine to sit: HOB elevated     General bed mobility comments: use of bedrail  Transfers Overall transfer level: Needs assistance Equipment used: Rolling walker (2 wheeled) Transfers: Sit to/from [employer] Transfers Sit to Stand: Supervision Stand pivot transfers: Supervision       General transfer comment: pt uses a walker to pivot to the chair  Ambulation/Gait Ambulation/Gait assistance:  (unable to walk at this time)              Financial trader Rankin (Stroke Patients Only)       Balance   Sitting-balance support: No upper extremity supported;Feet supported Sitting balance-Leahy Scale: Good     Standing balance support: Bilateral upper extremity supported;During functional activity Standing balance-Leahy Scale: Fair                               Pertinent Vitals/Pain Pain Assessment: No/denies pain    Home Living Family/patient expects to be discharged to:: Skilled nursing facility                      Prior Function Level of Independence: Independent with assistive device(s)               Hand Dominance   Dominant Hand: Right    Extremity/Trunk  Assessment               Lower Extremity Assessment: Overall WFL for tasks assessed         Communication   Communication: No difficulties  Cognition Arousal/Alertness: Awake/alert Behavior During Therapy: Anxious Overall Cognitive Status: Within Functional Limits for tasks assessed                      General Comments      Exercises        Assessment/Plan    PT Assessment All further PT needs can be met in the next venue of care  PT Diagnosis Difficulty walking;Generalized weakness   PT Problem List    PT Treatment Interventions     PT Goals (Current goals can be found in the Care Plan section) Acute Rehab PT Goals PT Goal Formulation: All  assessment and education complete, DC therapy    Frequency     Barriers to discharge        Co-evaluation               End of Session Equipment Utilized During Treatment: Gait belt;Oxygen Activity Tolerance: Treatment limited secondary to agitation Patient left: in chair;with call bell/phone within reach;with family/visitor present Nurse Communication: Mobility status         Time: 1000-1106 PT Time Calculation (min) (ACUTE ONLY): 66 min   Charges:   PT Evaluation $Initial PT Evaluation Tier I: 1 Procedure PT Treatments $Therapeutic Activity: 8-22 mins   PT G Codes:        Sable Feil 04/28/2014, 11:16 AM

## 2014-04-28 NOTE — Clinical Social Work Note (Signed)
CSW updated Insurance rep regarding patient's prior level of functioning at home- Awaiting SNF auth for probable dc tomorrow- Eduard Clos, MSW, [place]

## 2014-04-28 NOTE — Progress Notes (Signed)
UR chart review completed.  

## 2014-04-28 NOTE — Progress Notes (Signed)
Patient Demographics  [name] [name], is a 74 y.o. female, DOB - 17-Mar-1940, MVE:[id number]  Admit date - 04/26/2014   Admitting Physician Doree Albee, MD  Outpatient Primary MD for the patient is Sherrie Mustache, MD  LOS - 2   Chief Complaint  Patient presents with  . Diarrhea        Subjective:   [name] [name] today has, No headache, No chest pain, No abdominal pain - No Nausea, No new weakness tingling or numbness, No Cough - SOB. Diarrhea better.  Assessment & Plan    1. Diarrhea. Could be viral gastroenteritis. C. difficile negative. Will place on Imodium as needed along with Flagyl till stool cultures are final. Leukocytosis improving patient feels better. Supportive care. Advance activity may require placement.   2. CAD. Recent post coronary stenting with drug-eluting stent to left circumflex few weeks ago at [facility]. Continue dual antiplatelet treatment along with statin for secondary prevention. Mild rise in troponin which is a non-ACS pattern, no chest pain, likely due to underlying stress from diarrhea and dehydration. Appreciate cardiology input.   3. COPD. Currently undergoing prednisone taper continue, on 4 L nasal cannula oxygen continue, counseled to quit smoking. Continue supportive care.   4. Dyslipidemia. Continue statin.   5. SVT ( no Afib per Cards)  - currently on amiodarone, Cardizem along with dual antiplatelet treatment .    6. 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 Currently compensated stop IV fluids. Not on beta blocker due to severe COPD.   7.GERD. Continue PPI.     8. Hx straight of stroke. On aspirin and Plavix and statin. No acute problems.     9. Anemia. Has underlying anemia of chronic disease along with dilution from  IV fluids, hold IV fluids, type screen, repeat H&H in the morning.    10. Hypokalemia -  replaced.     Code Status: Full  Family Communication: husband  Disposition Plan: HHPT vs SNF   Procedures     Consults Cards   Medications  Scheduled Meds: . amiodarone  200 mg Oral QHS  . arformoterol  15 mcg Nebulization BID  . aspirin EC  81 mg Oral QHS  . clopidogrel  75 mg Oral QHS  . diltiazem  300 mg Oral Daily  . famotidine  20 mg Oral Daily  . furosemide  40 mg Oral Daily  . heparin  5,000 Units Subcutaneous 3 times per day  . latanoprost  1 drop Both Eyes QHS  . metroNIDAZOLE  500 mg Oral 3 times per day  . nystatin  5 mL Oral QID  . pantoprazole  40 mg Oral Daily  . potassium chloride SA  40 mEq Oral Daily  . potassium chloride  40 mEq Oral Once  . pravastatin  80 mg Oral QHS  . predniSONE  5 mg Oral Q breakfast  . tamsulosin  0.4 mg Oral Daily   Continuous Infusions:  PRN Meds:.acetaminophen, guaiFENesin-dextromethorphan, ipratropium, levalbuterol, loperamide, nitroGLYCERIN, [DISCONTINUED] ondansetron **OR** ondansetron (ZOFRAN) IV  DVT Prophylaxis   Heparin   Lab Results  Component Value Date   PLT 210 04/28/2014    Antibiotics    Anti-infectives    Start     Dose/Rate Route  Frequency Ordered Stop   04/27/14 1000  azithromycin (ZITHROMAX) tablet 500 mg  Status:  Discontinued     500 mg Oral Daily 04/26/14 1851 04/28/14 0753   04/27/14 0800  metroNIDAZOLE (FLAGYL) tablet 500 mg     500 mg Oral 3 times per day 04/27/14 0757            Objective:   Filed Vitals:   04/27/14 1939 04/27/14 2027 04/28/14 0539 04/28/14 0820  BP:  104/50 124/91   Pulse:  78 75   Temp:  98 F (36.7 C) 98.6 F (37 C)   TempSrc:  Oral Oral   Resp:  20 20   Height:      Weight:      SpO2: 99% 100% 98% 93%    Wt Readings from Last 3 Encounters:  04/26/14 76.6 kg (168 lb 14 oz)  04/23/14 78.2 kg (172 lb 6.4 oz)  12/29/13 81.194 kg (179 lb)     Intake/Output  Summary (Last 24 hours) at 04/28/14 0959 Last data filed at 04/28/14 0545  Gross per 24 hour  Intake      0 ml  Output   2000 ml  Net  -2000 ml     Physical Exam  Awake Alert, Oriented X 3, No new F.N deficits, Normal affect [place].AT,PERRAL Supple Neck,No JVD, No cervical lymphadenopathy appriciated.  Symmetrical Chest wall movement, Mod air movement bilaterally, few wheezes RRR,No Gallops,Rubs or new Murmurs, No Parasternal Heave +ve B.Sounds, Abd Soft, No tenderness, No organomegaly appriciated, No rebound - guarding or rigidity. No Cyanosis, Clubbing or edema, No new Rash or bruise     Data Review   Micro Results Recent Results (from the past 240 hour(s))  MRSA PCR Screening     Status: None   Collection Time: 04/19/14 11:29 AM  Result Value Ref Range Status   MRSA by PCR NEGATIVE NEGATIVE Final    Comment:        The GeneXpert MRSA Assay (FDA approved for NASAL specimens only), is one component of a comprehensive MRSA colonization surveillance program. It is not intended to diagnose MRSA infection nor to guide or monitor treatment for MRSA infections.   Clostridium Difficile by PCR     Status: None   Collection Time: 04/26/14  2:21 PM  Result Value Ref Range Status   C difficile by pcr NEGATIVE NEGATIVE Final    Radiology Reports Dg Chest 1 View  04/15/2014   CLINICAL DATA:  74 year old female with a history of shortness of breath, hypertension  EXAM: CHEST  1 VIEW  COMPARISON:  11/11/2013  FINDINGS: Cardiomediastinal silhouette unchanged in size and contour with cardiomegaly.  Atherosclerotic calcifications of the aortic arch.  No evidence of pulmonary vascular congestion.  No pneumothorax.  Persistent opacity at the base of the right lung, present on comparison plain film studies.  Retrocardiac region not well evaluated.  No definite confluent airspace disease.  No displaced fracture.  IMPRESSION: No evidence of pulmonary edema, with stable cardiomegaly and  atherosclerotic changes.  Retrocardiac region not well visualized, and if there is were further concern for acute process, a dedicated PA and lateral chest x-ray would be useful.  Persistent opacity at the right base, likely reflective of atelectasis and/ or scarring. If there were a need to further characterize the finding a CT would be required.  Signed,  Dulcy Fanny. Earleen Newport, DO  [facility]  [facility]   Electronically Signed   By: Corrie Mckusick D.O.  On: 04/15/2014 17:09   Dg Chest Portable 1 View  04/26/2014   CLINICAL DATA:  Low oxygen saturation  EXAM: PORTABLE CHEST - 1 VIEW  COMPARISON:  04/19/2014  FINDINGS: Cardiac shadow remains enlarged. The degree of vascular congestion is improved when compared with the prior exam. A focal infiltrate is seen. No acute bony abnormality is noted.  IMPRESSION: No active disease.   Electronically Signed   By: Inez Catalina M.D.   On: 04/26/2014 14:24   Dg Chest Port 1 View  04/19/2014   CLINICAL DATA:  Shortness of breath, weakness and respiratory distress for 1 day.  EXAM: PORTABLE CHEST - 1 VIEW  COMPARISON:  04/15/2014  FINDINGS: Stable cardiomegaly without definite edema or CHF. Limited exam because of positioning. Ill-defined increased bibasilar opacities concerning for basilar atelectasis and/or developing airspace disease. Left effusion not excluded. No pneumothorax. Trachea midline. Atherosclerosis of the aorta. Background emphysema suspected.  IMPRESSION: Stable cardiomegaly without CHF  Ill-defined bibasilar atelectasis versus developing airspace disease  Suspect small left effusion.   Electronically Signed   By: Jerilynn Mages.  Shick M.D.   On: 04/19/2014 16:12   Dg Chest Port 1 View  04/15/2014   CLINICAL DATA:  Chest pain and difficulty breathing acute onset  EXAM: PORTABLE CHEST - 1 VIEW  COMPARISON:  Study obtained earlier in the day; November 11, 2013  FINDINGS: There is underlying emphysematous change. There is  chronic pleural thickening on the right which appears stable. There is no frank edema or consolidation. Heart is enlarged with pulmonary vascularity within normal limits. There is atherosclerotic change in the aorta. No adenopathy. No bone lesions.  IMPRESSION: Stable cardiomegaly. Chronic pleural thickening on the right. Underlying emphysema. No frank edema or consolidation.   Electronically Signed   By: Lowella Grip III M.D.   On: 04/15/2014 14:49     CBC  Recent Labs Lab 04/21/14 1630 04/22/14 0333 04/26/14 1358 04/27/14 0628 04/28/14 0602  WBC 13.5* 11.4* 24.2* 17.1* 15.3*  HGB 8.6* 8.5* 9.0* 7.6* 7.2*  HCT 30.8* 30.5* 32.7* 27.7* 26.3*  PLT 292 258 LARGE PLATELETS PRESENT 226 210  MCV 83.2 83.3 87.7 89.4 88.9  MCH 23.2* 23.2* 24.1* 24.5* 24.3*  MCHC 27.9* 27.9* 27.5* 27.4* 27.4*  RDW 16.8* 16.8* 17.9* 18.2* 18.3*  LYMPHSABS  --   --  0.3*  --   --   MONOABS  --   --  1.9*  --   --   EOSABS  --   --  0.0  --   --   BASOSABS  --   --  0.1  --   --     Chemistries   Recent Labs Lab 04/22/14 0333 04/23/14 0321 04/26/14 1358 04/27/14 0628 04/28/14 0602  NA 137 138 138 143 141  K 3.6 3.8 3.9 3.8 3.4*  CL 85* 85* 91* 97 94*  CO2 44* 44* 38* 41* 39*  GLUCOSE 164* 109* 161* 114* 111*  BUN 23 21 17 14 12   CREATININE 0.62 0.59 0.72 0.57 0.64  CALCIUM 8.5 8.5 8.5 8.3* 8.5  AST  --  29 30 24   --   ALT  --  34 37* 29  --   ALKPHOS  --  37* 47 39  --   BILITOT  --  0.6 1.1 0.8  --    ------------------------------------------------------------------------------------------------------------------ estimated creatinine clearance is 65 mL/min (by C-G formula based on Cr of 0.64). ------------------------------------------------------------------------------------------------------------------ No results for input(s): HGBA1C in the last 72 hours. ------------------------------------------------------------------------------------------------------------------ No  results  for input(s): CHOL, HDL, LDLCALC, TRIG, CHOLHDL, LDLDIRECT in the last 72 hours. ------------------------------------------------------------------------------------------------------------------ No results for input(s): TSH, T4TOTAL, T3FREE, THYROIDAB in the last 72 hours.  Invalid input(s): FREET3 ------------------------------------------------------------------------------------------------------------------ No results for input(s): VITAMINB12, FOLATE, FERRITIN, TIBC, IRON, RETICCTPCT in the last 72 hours.  Coagulation profile No results for input(s): INR, PROTIME in the last 168 hours.  No results for input(s): DDIMER in the last 72 hours.  Cardiac Enzymes  Recent Labs Lab 04/27/14 0951 04/27/14 1530 04/27/14 2125  TROPONINI 0.10* 0.08* 0.12*   ------------------------------------------------------------------------------------------------------------------ Invalid input(s): POCBNP     Time Spent in minutes   35   Caitlain Tweed K M.D on 04/28/2014 at 9:59 AM  Between 7am to 7pm - Pager - [phone]  After 7pm go to [email or web address] - password [facility]  [facility]   Office  [phone]

## 2014-04-28 NOTE — Clinical Social Work Note (Signed)
CSW met with patient and husband.  CSW advised that the [facility] believed that the would be able to take her tomorrow upon discharge.  CSW advised patient that [facility] would be in to speak with her today.    CSW sent clinicals to [employer] in order to seek authorization for SNF placement.   Ambrose Pancoast, [place]

## 2014-04-29 LAB — HEMOGLOBIN AND HEMATOCRIT, BLOOD
HEMATOCRIT: 26 % — AB (ref 36.0–46.0)
Hemoglobin: 7.4 g/dL — ABNORMAL LOW (ref 12.0–15.0)

## 2014-04-29 LAB — BASIC METABOLIC PANEL
Anion gap: 8 (ref 5–15)
BUN: 10 mg/dL (ref 6–23)
CHLORIDE: 95 mmol/L — AB (ref 96–112)
CO2: 37 mmol/L — AB (ref 19–32)
Calcium: 8.6 mg/dL (ref 8.4–10.5)
Creatinine, Ser: 0.69 mg/dL (ref 0.50–1.10)
GFR calc Af Amer: >90 mL/min (ref >90)
GFR, EST NON AFRICAN AMERICAN: 85 mL/min — AB (ref >90)
GLUCOSE: 110 mg/dL — AB (ref 70–99)
POTASSIUM: 3.6 mmol/L (ref 3.5–5.1)
Sodium: 140 mmol/L (ref 135–145)

## 2014-04-29 MED ORDER — PREDNISONE 1 MG PO TABS: ORAL_TABLET | ORAL | Status: DC

## 2014-04-29 MED ORDER — FERROUS SULFATE 325 (65 FE) MG PO TABS
325.0000 mg | ORAL_TABLET | Freq: Two times a day (BID) | ORAL | Status: DC
Start: 2014-04-29 — End: 2014-04-29
  Administered 2014-04-29: 325 mg via ORAL
  Filled 2014-04-29: qty 1

## 2014-04-29 MED ORDER — METRONIDAZOLE 500 MG PO TABS: 500.0000 mg | ORAL_TABLET | Freq: Three times a day (TID) | ORAL | Status: AC

## 2014-04-29 MED ORDER — FERROUS SULFATE 325 (65 FE) MG PO TABS: 325.0000 mg | ORAL_TABLET | Freq: Two times a day (BID) | ORAL | Status: DC

## 2014-04-29 MED ORDER — LOPERAMIDE HCL 2 MG PO CAPS: 2.0000 mg | ORAL_CAPSULE | Freq: Four times a day (QID) | ORAL | Status: DC | PRN

## 2014-04-29 MED ORDER — TAMSULOSIN HCL 0.4 MG PO CAPS: 0.4000 mg | ORAL_CAPSULE | Freq: Every day | ORAL | Status: DC

## 2014-04-29 MED ORDER — ALPRAZOLAM 0.25 MG PO TABS
0.2500 mg | ORAL_TABLET | Freq: Two times a day (BID) | ORAL | Status: DC | PRN
  Administered 2014-04-29: 0.25 mg via ORAL
  Filled 2014-04-29: qty 1

## 2014-04-29 NOTE — Discharge Instructions (Signed)
Follow with Primary MD Sherrie Mustache, MD in 3-4 days   Get CBC, CMP, 2 view Chest X ray checked  by SNF MD or Primary MD in 3 days.    Activity: As tolerated with Full fall precautions use walker/cane & assistance as needed   Disposition SNF  Diet: Heart Healthy  Check your Weight same time everyday, if you gain over 2 pounds, or you develop in leg swelling, experience more shortness of breath or chest pain, call your Primary MD immediately. Follow Cardiac Low Salt Diet and 1.5 lit/day fluid restriction.   On your next visit with your primary care physician please Get Medicines reviewed and adjusted.   Please request your Prim.MD to go over all Hospital Tests and Procedure/Radiological results at the follow up, please get all Hospital records sent to your Prim MD by signing hospital release before you go home.   If you experience worsening of your admission symptoms, develop shortness of breath, life threatening emergency, suicidal or homicidal thoughts you must seek medical attention immediately by calling 911 or calling your MD immediately  if symptoms less severe.  You Must read complete instructions/literature along with all the possible adverse reactions/side effects for all the Medicines you take and that have been prescribed to you. Take any new Medicines after you have completely understood and accpet all the possible adverse reactions/side effects.   Do not drive, operating heavy machinery, perform activities at heights, swimming or participation in water activities or provide baby sitting services if your were admitted for syncope or siezures until you have seen by Primary MD or a Neurologist and advised to do so again.  Do not drive when taking Pain medications.    Do not take more than prescribed Pain, Sleep and Anxiety Medications  Special Instructions: If you have smoked or chewed Tobacco  in the last 2 yrs please stop smoking, stop any regular Alcohol  and or any  Recreational drug use.  Wear Seat belts while driving.   Please note  You were cared for by a hospitalist during your hospital stay. If you have any questions about your discharge medications or the care you received while you were in the hospital after you are discharged, you can call the unit and asked to speak with the hospitalist on call if the hospitalist that took care of you is not available. Once you are discharged, your primary care physician will handle any further medical issues. Please note that NO REFILLS for any discharge medications will be authorized once you are discharged, as it is imperative that you return to your primary care physician (or establish a relationship with a primary care physician if you do not have one) for your aftercare needs so that they can reassess your need for medications and monitor your lab values.

## 2014-04-29 NOTE — Clinical Social Work Note (Signed)
CSW facilitated discharge.   CSW met with patient and husband and advised that patient was being discharged and would be transported to [facility] via [facility].  CSW notified [facility] of patient being discharged today.   CSW signing off.  Ambrose Pancoast, [place]

## 2014-04-29 NOTE — Clinical Social Work Placement (Signed)
Clinical Social Work Department CLINICAL SOCIAL WORK PLACEMENT NOTE 04/29/2014  Patient:  [name] [name], [name] [name]  Account Number:  [id number] Admit date:  04/26/2014  Clinical Social Worker:  Ambrose Pancoast, LCSW  Date/time:  04/29/2014 02:31 PM  Clinical Social Work is seeking post-discharge placement for this patient at the following level of care:   SKILLED NURSING   (*CSW will update this form in Epic as items are completed)   04/28/2014  Patient/family provided with [place] Department of Clinical Social Work's list of facilities offering this level of care within the geographic area requested by the patient (or if unable, by the patient's family).  04/28/2014  Patient/family informed of their freedom to choose among providers that offer the needed level of care, that participate in Medicare, Medicaid or managed care program needed by the patient, have an available bed and are willing to accept the patient.  04/28/2014  Patient/family informed of [facility]' ownership interest in [facility], as well as of the fact that they are under no obligation to receive care at this facility.  PASARR submitted to EDS on 04/28/2014 PASARR number received on 04/28/2014  FL2 transmitted to all facilities in geographic area requested by pt/family on  04/28/2014 FL2 transmitted to all facilities within larger geographic area on   Patient informed that his/her managed care company has contracts with or will negotiate with  certain facilities, including the following:     Patient/family informed of bed offers received:  04/28/2014 Patient chooses bed at [facility] Physician recommends and patient chooses bed at    Patient to be transferred to [place] on  04/29/2014 Patient to be transferred to facility by [facility] Patient and family notified of transfer on 04/29/2014 Name of family member notified:  Mr. Curet, Husband  The following physician request were entered in  Epic:   Additional Comments:   Ambrose Pancoast, [place]

## 2014-04-29 NOTE — Discharge Summary (Addendum)
[name] [name], is a 74 y.o. female  DOB Oct 12, 1940  MRN [id number].  Admission date:  04/26/2014  Admitting Physician  Doree Albee, MD  Discharge Date:  04/29/2014   Primary MD  Sherrie Mustache, MD  Recommendations for primary care physician for things to follow:   Repeat CBC, BMP, iron panel in a week.   Admission Diagnosis  Diarrhea   Discharge Diagnosis  Diarrhea    Active Problems:   Essential hypertension   Coronary atherosclerosis   Anxiety disorder   COPD (chronic obstructive pulmonary disease)   Elevated troponin   Diarrhea   Dehydration      Past Medical History  Diagnosis Date  . Hypertension   . Hyperlipidemia   . Cerebrovascular disease, unspecified   . Unspecified glaucoma   . Obesity, unspecified   . Coronary artery disease     cath 04/06/10: LAD occluded with R-L collats; med Rx WTC....probable V. Tach...med Rx  . Ventricular tachycardia   . SVT (supraventricular tachycardia)   . AAA (abdominal aortic aneurysm)   . COPD (chronic obstructive pulmonary disease) 2013    [place] of breath     WITH ACTIVITY--USES OXYGEN AS NEEDED  2&1/2 L PER NASAL CANNUL  . Complication of anesthesia     PT STATES SHE CAN NOT BE PUT TO SLEEP BECAUSE OF HER LUNG PROBLEMS  . Carotid artery occlusion   . Anemia   . CHF (congestive heart failure)   . Abdominal aneurysm     "hasn't been repaired" (10/20/2013)  . Pneumonia X ~ 2  . On home oxygen therapy     "4L; 24/7" (10/20/2013)  . GERD (gastroesophageal reflux disease)   . Migraines     "before tubal ligation I had them alot; seldom have one anymore" (10/20/2013)  . Arthritis     "knees mainly" (10/20/2013)  . Bladder cancer 2009  . Atrial fibrillation   . Bell palsy 2014    Past Surgical History  Procedure Laterality  Date  . Cystoscopy    . Bladder tumor excision      resection of 5 bladder, and fulguration of 1 small bladder tumor  . Cystoscopy      cold cup bladder biopsy of five tumors, and fulguration of one tumor  . Wedge resection Right 1980's    Remote right lung  . Cystoscopy with biopsy  12/24/2011    Procedure: CYSTOSCOPY WITH BIOPSY;  Surgeon: Fredricka Bonine, MD;  Location: [facility] ORS;  Service: Urology;  Laterality: N/A;  . Tubal ligation Bilateral 1980's  . Cataract extraction w/ intraocular lens  implant, bilateral Bilateral ~ 2011  . Cardiac catheterization  "several"    "too bad to put stents in"  . Left heart catheterization with coronary angiogram N/A 04/18/2014    Procedure: LEFT HEART CATHETERIZATION WITH CORONARY ANGIOGRAM;  Surgeon: Jolaine Artist, MD;  Location: [facility] CATH LAB;  Service: Cardiovascular;  Laterality: N/A;  . Percutaneous coronary rotoblator intervention (pci-r) N/A 04/21/2014  Procedure: PERCUTANEOUS CORONARY ROTOBLATOR INTERVENTION (PCI-R);  Surgeon: Blane Ohara, MD;  Location: [facility] CATH LAB;  Service: Cardiovascular;  Laterality: N/A;       History of present illness and  Hospital Course:     Kindly see H&P for history of present illness and admission details, please review complete Labs, Consult reports and Test reports for all details in brief  HPI  from the history and physical done on the day of admission  This is a 74 year old lady who gives a four-day history of diarrhea. She was recently discharged approximately 4 days ago from [facility]. She was admitted there because of dyspnea. It was felt this was multifactorial, COPD and exacerbation of CHF. She apparently spent time with BiPAP. She also underwent cardiac catheterization and had PCI done. It seems that she left the hospital on tapering dose of prednisone and antibiotics. She had been on antibiotics during the hospitalization. She has noticed that she has had several episodes of  diarrhea although it seems to be easing off with the stool becoming more formed in the last 24 hours or so. She has had no vomiting or abdominal pain. There is no hematemesis or any rectal bleeding. She denies any obvious fever. Evaluation in the emergency room showed her to be clinically dehydrated although electrolytes were acceptable. She has become rather weak and she is going to be admitted for further management. There was concern about C. difficile colitis.  Hospital Course   1. Diarrhea. Likely gastroenteritis. C. difficile negative. Will place on Imodium as needed along with Flagyl till stool cultures are final. We'll request PCP and is in the family to review stool cultures one time in 3-4 days thus far they're negative, C. difficile is negative. Leukocytosis improving patient feels better. Supportive care. Advance activity will require placement.    2. CAD. Recent post coronary stenting with drug-eluting stent to left circumflex few weeks ago at [facility]. Continue dual antiplatelet treatment along with statin for secondary prevention. Mild rise in troponin which is a non-ACS pattern, no chest pain, likely due to underlying stress from diarrhea and dehydration. Appreciate cardiology input for further workup per cardiology.    3. COPD. Currently undergoing prednisone taper continue, on 4 L nasal cannula oxygen continue, counseled to quit smoking. Follow with primary pulmonologist Dr. Shyrl Numbers in 1-2 weeks post discharge.    4. Dyslipidemia. Continue statin.    5. SVT ( no Afib per Cards) - currently on amiodarone, Cardizem along with dual antiplatelet treatment .     6. 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 Currently compensated stop IV fluids. Not on beta blocker due to severe COPD.    7.GERD. Continue PPI.     8. Hx straight of stroke. On aspirin and Plavix and statin. No acute problems.     9. Anemia. Has underlying anemia of chronic disease  along with dilution from IV fluids, her MCH and MCHC are low pointing towards possible iron deficiency, will place on oral iron, request PCP to do age-appropriate iron deficiency anemia workup.    10. Hypokalemia - replaced and stable     11. Developed urinary retention due to lack of activity. Placed on Flomax, Foley was placed, we'll remove Foley early this morning, will give her a voiding trial, if she is unable to void and Foley will be replaced and then she will require a urology follow-up in a week. If she is able to void we will send her  on Flomax only. Please monitor postvoid residuals.     Discharge Condition: Stable   Follow UP  Follow-up Information    Follow up with Sherrie Mustache, MD. Schedule an appointment as soon as possible for a visit in 3 days.   Specialty:  Family Medicine   Contact information:   [address] [place] [address] [phone]       Follow up with Christinia Gully, MD. Schedule an appointment as soon as possible for a visit in 1 week.   Specialty:  Pulmonary Disease   Why:  COPD   Contact information:   [address] [place] [address] [phone]       Follow up with Herminio Commons, MD. Schedule an appointment as soon as possible for a visit in 1 week.   Specialty:  Cardiology   Why:  CAD   Contact information:   [place] [address] [phone]         Discharge Instructions  and  Discharge Medications      Discharge Instructions    Discharge instructions    Complete by:  As directed   Follow with Primary MD Sherrie Mustache, MD in 3-4 days   Get CBC, CMP, 2 view Chest X ray checked  by SNF MD or Primary MD in 3 days.    Activity: As tolerated with Full fall precautions use walker/cane & assistance as needed   Disposition SNF  Diet: Heart Healthy  Check your Weight same time everyday, if you gain over 2 pounds, or you develop in leg swelling, experience more shortness of  breath or chest pain, call your Primary MD immediately. Follow Cardiac Low Salt Diet and 1.5 lit/day fluid restriction.   On your next visit with your primary care physician please Get Medicines reviewed and adjusted.   Please request your Prim.MD to go over all Hospital Tests and Procedure/Radiological results at the follow up, please get all Hospital records sent to your Prim MD by signing hospital release before you go home.   If you experience worsening of your admission symptoms, develop shortness of breath, life threatening emergency, suicidal or homicidal thoughts you must seek medical attention immediately by calling 911 or calling your MD immediately  if symptoms less severe.  You Must read complete instructions/literature along with all the possible adverse reactions/side effects for all the Medicines you take and that have been prescribed to you. Take any new Medicines after you have completely understood and accpet all the possible adverse reactions/side effects.   Do not drive, operating heavy machinery, perform activities at heights, swimming or participation in water activities or provide baby sitting services if your were admitted for syncope or siezures until you have seen by Primary MD or a Neurologist and advised to do so again.  Do not drive when taking Pain medications.    Do not take more than prescribed Pain, Sleep and Anxiety Medications  Special Instructions: If you have smoked or chewed Tobacco  in the last 2 yrs please stop smoking, stop any regular Alcohol  and or any Recreational drug use.  Wear Seat belts while driving.   Please note  You were cared for by a hospitalist during your hospital stay. If you have any questions about your discharge medications or the care you received while you were in the hospital after you are discharged, you can call the unit and asked to speak with the hospitalist on call if the hospitalist that  took care of you is not  available. Once you are discharged, your primary care physician will handle any further medical issues. Please note that NO REFILLS for any discharge medications will be authorized once you are discharged, as it is imperative that you return to your primary care physician (or establish a relationship with a primary care physician if you do not have one) for your aftercare needs so that they can reassess your need for medications and monitor your lab values.     Increase activity slowly    Complete by:  As directed             Medication List    STOP taking these medications        azithromycin 500 MG tablet  Commonly known as:  ZITHROMAX      TAKE these medications        acetaminophen 325 MG tablet  Commonly known as:  TYLENOL  Take 2 tablets (650 mg total) by mouth every 4 (four) hours as needed for headache or mild pain.     amiodarone 200 MG tablet  Commonly known as:  PACERONE  Take 200 mg by mouth at bedtime.     arformoterol 15 MCG/2ML Nebu  Commonly known as:  BROVANA  Take 2 mLs (15 mcg total) by nebulization 2 (two) times daily.     aspirin EC 81 MG tablet  Take 81 mg by mouth at bedtime.     clopidogrel 75 MG tablet  Commonly known as:  PLAVIX  Take 1 tablet (75 mg total) by mouth at bedtime.     Dextromethorphan-Guaifenesin 5-100 MG/5ML Liqd  Commonly known as:  MUCINEX FAST-MAX DM MAX  Take 5 mLs by mouth every 12 (twelve) hours as needed (for congestion).     diltiazem 300 MG 24 hr capsule  Commonly known as:  CARDIZEM CD  TAKE 1 CAPSULE IN THE EVENING.     ferrous sulfate 325 (65 FE) MG tablet  Take 1 tablet (325 mg total) by mouth 2 (two) times daily with a meal.     furosemide 40 MG tablet  Commonly known as:  LASIX  Take 1 tablet (40 mg total) by mouth daily.     ipratropium 0.02 % nebulizer solution  Commonly known as:  ATROVENT  USE 1 VIAL IN NEBULIZER EVERY 6 HOURS.     levalbuterol 45 MCG/ACT inhaler  Commonly known as:  XOPENEX HFA    Inhale 2 puffs into the lungs every 4 (four) hours as needed for wheezing.     loperamide 2 MG capsule  Commonly known as:  IMODIUM  Take 1 capsule (2 mg total) by mouth every 6 (six) hours as needed for diarrhea or loose stools.     metroNIDAZOLE 500 MG tablet  Commonly known as:  FLAGYL  Take 1 tablet (500 mg total) by mouth every 8 (eight) hours.     nitroGLYCERIN 0.4 MG SL tablet  Commonly known as:  NITROSTAT  Place 0.4 mg under the tongue every 5 (five) minutes as needed for chest pain (may repeat x3). Chest pain     nystatin 100000 UNIT/ML suspension  Commonly known as:  MYCOSTATIN  Take 5 mLs by mouth 4 (four) times daily.     pantoprazole 40 MG tablet  Commonly known as:  PROTONIX  TAKE 1 TABLET BY MOUTH ONCE DAILY 30-60 MINTUES BEFORE FIRST MEAL OF THE DAY.     potassium chloride SA 20 MEQ tablet  Commonly known as:  K-DUR,KLOR-CON  Take 40 mEq by mouth daily.     pravastatin 80 MG tablet  Commonly known as:  PRAVACHOL  Take 80 mg by mouth at bedtime.     predniSONE 1 MG tablet  Commonly known as:  DELTASONE  - 2 tablets for 3 days  - 1 tablet for 3 days then discontinue     promethazine 25 MG tablet  Commonly known as:  PHENERGAN  Take 25 mg by mouth every 4 (four) hours as needed for nausea.     ranitidine 150 MG capsule  Commonly known as:  ZANTAC  Take 150 mg by mouth at bedtime.     tamsulosin 0.4 MG Caps capsule  Commonly known as:  FLOMAX  Take 1 capsule (0.4 mg total) by mouth daily.     Travoprost (BAK Free) 0.004 % Soln ophthalmic solution  Commonly known as:  TRAVATAN  Place 1 drop into both eyes at bedtime.          Diet and Activity recommendation: See Discharge Instructions above   Consults obtained - Cards   Major procedures and Radiology Reports - PLEASE review detailed and final reports for all details, in brief -       Dg Chest Portable 1 View  04/26/2014   CLINICAL DATA:  Low oxygen saturation  EXAM: PORTABLE CHEST - 1  VIEW  COMPARISON:  04/19/2014  FINDINGS: Cardiac shadow remains enlarged. The degree of vascular congestion is improved when compared with the prior exam. A focal infiltrate is seen. No acute bony abnormality is noted.  IMPRESSION: No active disease.   Electronically Signed   By: Inez Catalina M.D.   On: 04/26/2014 14:24       Micro Results      Recent Results (from the past 240 hour(s))  MRSA PCR Screening     Status: None   Collection Time: 04/19/14 11:29 AM  Result Value Ref Range Status   MRSA by PCR NEGATIVE NEGATIVE Final    Comment:        The GeneXpert MRSA Assay (FDA approved for NASAL specimens only), is one component of a comprehensive MRSA colonization surveillance program. It is not intended to diagnose MRSA infection nor to guide or monitor treatment for MRSA infections.   Clostridium Difficile by PCR     Status: None   Collection Time: 04/26/14  2:21 PM  Result Value Ref Range Status   C difficile by pcr NEGATIVE NEGATIVE Final     Today   Subjective:   [name] [name] today has no headache,no chest abdominal pain,no new weakness tingling or numbness, feels much better wants to go home today.  SOB Better.  Objective:   Blood pressure 102/46, pulse 80, temperature 98.8 F (37.1 C), temperature source Oral, resp. rate 20, height 5\' 5"  (1.651 m), weight 76.6 kg (168 lb 14 oz), SpO2 92 %.   Intake/Output Summary (Last 24 hours) at 04/29/14 0845 Last data filed at 04/28/14 1700  Gross per 24 hour  Intake      0 ml  Output    800 ml  Net   -800 ml    Exam Awake Alert, Oriented x 3, No new F.N deficits, Normal affect [place].AT,PERRAL Supple Neck,No JVD, No cervical lymphadenopathy appriciated.  Symmetrical Chest wall movement, Good air movement bilaterally, CTAB RRR,No Gallops,Rubs or new Murmurs, No Parasternal Heave +ve B.Sounds, Abd Soft, Non tender, No organomegaly appriciated, No rebound -guarding or rigidity. No Cyanosis, Clubbing or edema, No new Rash or  bruise  Data Review   CBC w Diff: Lab Results  Component Value Date   WBC 15.3* 04/28/2014   HGB 7.4* 04/29/2014   HCT 26.0* 04/29/2014   PLT 210 04/28/2014   LYMPHOPCT 1* 04/26/2014   MONOPCT 8 04/26/2014   EOSPCT 0 04/26/2014   BASOPCT 0 04/26/2014    CMP: Lab Results  Component Value Date   NA 140 04/29/2014   K 3.6 04/29/2014   CL 95* 04/29/2014   CO2 37* 04/29/2014   BUN 10 04/29/2014   CREATININE 0.69 04/29/2014   CREATININE 0.63 08/01/2011   PROT 5.3* 04/27/2014   ALBUMIN 3.0* 04/27/2014   BILITOT 0.8 04/27/2014   ALKPHOS 39 04/27/2014   AST 24 04/27/2014   ALT 29 04/27/2014  .   Total Time in preparing paper work, data evaluation and todays exam - 35 minutes  Thurnell Lose M.D on 04/29/2014 at 8:45 AM  [facility]   Office  [phone]

## 2014-04-29 NOTE — Progress Notes (Signed)
Patient with orders to be discharged to [facility]. Report called to [facility], nurse. Discharge instructions sent with patient. Patient stable. Patient transported via EMS. [name] [name]

## 2014-05-01 DIAGNOSIS — I255 Ischemic cardiomyopathy: Secondary | ICD-10-CM | POA: Insufficient documentation

## 2014-05-01 LAB — STOOL CULTURE

## 2014-05-01 NOTE — Progress Notes (Deleted)
Cardiology Office Note   Date:  05/01/2014   ID:  [name] [name], DOB February 05, 1941, MRN [id number]  PCP:  Sherrie Mustache, MD  Cardiologist:  Dr. Kirk Ruths     Chief Complaint  Patient presents with  . Coronary Artery Disease  . Congestive Heart Failure  . Hospitalization Follow-up    TCM7     History of Present Illness: [name] [name] is a 74 y.o. female with a hx of CAD, HTN, HL, SVT, AAA, COPD, bladder CA, carotid stenosis.  Admitted in 2012 with VTach and LHC demonstrated an occluded LAD and 70% LCx stenosis.  This was treated medically. [name] was treated with beta blocker therapy.  She has had prior unsuccessful L carotid stent procedure in the past.  She is on Amiodarone for SVT.    Admitted 2/19-2/27 with a/c hypoxic/hypercapnic respiratory failure due to CHF/AECOPD.  She ruled in for NSTEMI.  Echo demonstrated EF 40-45% with wall motion abnormalities.  LHC on 04/18/14 showed severe CAD involving a chronic total occlusion of the LAD and high-grade proximal lesion of the dominant LCx. Also showed severe PAD (right iliac) and complex AAA.  She underwent successful rotational atherectomy and DES to LCx.  VVS evaluation for AAA was recommended as an OP.    Readmitted 3/1-3/4 with diarrhea.  CDiff was negative.  Seen by cardiology for elevated Troponin.  This was felt to be trending downward and c/w recent NSTEMI.  No further workup recommended.    She returns for FU.  ***   Studies/Reports Reviewed Today:  Echo 04/16/14 - LV strain is abnormal at -14.9%.  Mild LVH. EF 40-45%. AK ofmid-apicalinferoseptal myocardium. AK ofapicalinferior myocardium. AK of mid-apicalanteroseptal myocardium. Grade 1 diastolic dysfunction - Atrial septum: There was increased thickness of the septum, consistent with lipomatous hypertrophy. - PA peak pressure: 49 mm Hg (S).  Abdominal US 02/14/14 infrarenal fusiform AAA measuring 4.3 cm x 3.8 cm >> FU 6 mos  Carotid US 40/98/11 RICA < 91%,  LICA 47-82%  Cardiac Cath/PCI 04/21/14 LM: Either absent or short. Likely separate ostia for the LAD and LCX. LAD: Totally occluded ostially with left to left collaterals. Faint right to left collaterals.  LCX: Proximal 90% lesion.  RCA:  No significant plaque. Faint right to left collaterals. Abdomina aortogram = Large complex AAA with severe distal aortic disease and probable high-grade ostial disease in bilateral iliacs.  PCI:  Rotational atherectomy and 4.0x20 mm Promus DES to Prox CFX   Past Medical History  Diagnosis Date  . Hypertension   . Hyperlipidemia   . Cerebrovascular disease, unspecified   . Unspecified glaucoma   . Obesity, unspecified   . Coronary artery disease     cath 04/06/10: LAD occluded with R-L collats; med Rx WTC....probable V. Tach...med Rx  . Ventricular tachycardia   . SVT (supraventricular tachycardia)   . AAA (abdominal aortic aneurysm)   . COPD (chronic obstructive pulmonary disease) 2013    [place] of breath     WITH ACTIVITY--USES OXYGEN AS NEEDED  2&1/2 L PER NASAL CANNUL  . Complication of anesthesia     PT STATES SHE CAN NOT BE PUT TO SLEEP BECAUSE OF HER LUNG PROBLEMS  . Carotid artery occlusion   . Anemia   . CHF (congestive heart failure)   . Abdominal aneurysm     "hasn't been repaired" (10/20/2013)  . Pneumonia X ~ 2  . On home oxygen therapy     "4L; 24/7" (10/20/2013)  .  GERD (gastroesophageal reflux disease)   . Migraines     "before tubal ligation I had them alot; seldom have one anymore" (10/20/2013)  . Arthritis     "knees mainly" (10/20/2013)  . Bladder cancer 2009  . Atrial fibrillation   . Bell palsy 2014    Past Surgical History  Procedure Laterality Date  . Cystoscopy    . Bladder tumor excision      resection of 5 bladder, and fulguration of 1 small bladder tumor  . Cystoscopy      cold cup bladder biopsy of five tumors, and fulguration of one tumor  . Wedge resection Right 1980's    Remote  right lung  . Cystoscopy with biopsy  12/24/2011    Procedure: CYSTOSCOPY WITH BIOPSY;  Surgeon: Fredricka Bonine, MD;  Location: [facility];  Service: Urology;  Laterality: N/A;  . Tubal ligation Bilateral 1980's  . Cataract extraction w/ intraocular lens  implant, bilateral Bilateral ~ 2011  . Cardiac catheterization  "several"    "too bad to put stents in"  . Left heart catheterization with coronary angiogram N/A 04/18/2014    Procedure: LEFT HEART CATHETERIZATION WITH CORONARY ANGIOGRAM;  Surgeon: Jolaine Artist, MD;  Location: [facility] CATH LAB;  Service: Cardiovascular;  Laterality: N/A;  . Percutaneous coronary rotoblator intervention (pci-r) N/A 04/21/2014    Procedure: PERCUTANEOUS CORONARY ROTOBLATOR INTERVENTION (PCI-R);  Surgeon: Blane Ohara, MD;  Location: [facility] CATH LAB;  Service: Cardiovascular;  Laterality: N/A;     Current Outpatient Prescriptions  Medication Sig Dispense Refill  . acetaminophen (TYLENOL) 325 MG tablet Take 2 tablets (650 mg total) by mouth every 4 (four) hours as needed for headache or mild pain. 60 tablet 3  . amiodarone (PACERONE) 200 MG tablet Take 200 mg by mouth at bedtime.     Marland Kitchen arformoterol (BROVANA) 15 MCG/2ML NEBU Take 2 mLs (15 mcg total) by nebulization 2 (two) times daily. 120 mL 3  . aspirin EC 81 MG tablet Take 81 mg by mouth at bedtime.    . clopidogrel (PLAVIX) 75 MG tablet Take 1 tablet (75 mg total) by mouth at bedtime.    Marland Kitchen Dextromethorphan-Guaifenesin ([place] FAST-MAX DM MAX) 5-100 MG/5ML LIQD Take 5 mLs by mouth every 12 (twelve) hours as needed (for congestion). 237 mL 0  . diltiazem (CARDIZEM CD) 300 MG 24 hr capsule TAKE 1 CAPSULE IN THE EVENING. 30 capsule 1  . ferrous sulfate 325 (65 FE) MG tablet Take 1 tablet (325 mg total) by mouth 2 (two) times daily with a meal.  3  . furosemide (LASIX) 40 MG tablet Take 1 tablet (40 mg total) by mouth daily. 30 tablet 3  . ipratropium (ATROVENT) 0.02 % nebulizer solution USE 1 VIAL IN  NEBULIZER EVERY 6 HOURS. 300 mL 1  . levalbuterol (XOPENEX HFA) 45 MCG/ACT inhaler Inhale 2 puffs into the lungs every 4 (four) hours as needed for wheezing. 15 g 6  . loperamide (IMODIUM) 2 MG capsule Take 1 capsule (2 mg total) by mouth every 6 (six) hours as needed for diarrhea or loose stools. 30 capsule 0  . metroNIDAZOLE (FLAGYL) 500 MG tablet Take 1 tablet (500 mg total) by mouth every 8 (eight) hours.    . nitroGLYCERIN (NITROSTAT) 0.4 MG SL tablet Place 0.4 mg under the tongue every 5 (five) minutes as needed for chest pain (may repeat x3). Chest pain    . nystatin (MYCOSTATIN) 100000 UNIT/ML suspension Take 5 mLs by mouth 4 (four) times daily.     Marland Kitchen  pantoprazole (PROTONIX) 40 MG tablet TAKE 1 TABLET BY MOUTH ONCE DAILY 30-60 MINTUES BEFORE FIRST MEAL OF THE DAY. 30 tablet 3  . potassium chloride SA (K-DUR,KLOR-CON) 20 MEQ tablet Take 40 mEq by mouth daily.     . pravastatin (PRAVACHOL) 80 MG tablet Take 80 mg by mouth at bedtime.    . predniSONE (DELTASONE) 1 MG tablet 2 tablets for 3 days 1 tablet for 3 days then discontinue    . promethazine (PHENERGAN) 25 MG tablet Take 25 mg by mouth every 4 (four) hours as needed for nausea.     . ranitidine (ZANTAC) 150 MG capsule Take 150 mg by mouth at bedtime.    . tamsulosin (FLOMAX) 0.4 MG CAPS capsule Take 1 capsule (0.4 mg total) by mouth daily. 30 capsule   . Travoprost, BAK Free, (TRAVATAN) 0.004 % SOLN ophthalmic solution Place 1 drop into both eyes at bedtime.     No current facility-administered medications for this visit.   Facility-Administered Medications Ordered in Other Visits  Medication Dose Route Frequency Provider Last Rate Last Dose  . mitomycin (MUTAMYCIN) chemo injection 40 mg  40 mg Bladder Instillation Once Festus Aloe, MD        Allergies:   Albuterol; Levaquin; and Sulfa antibiotics    Social History:  The patient  reports that she quit smoking about 2 years ago. Her smoking use included Cigarettes. She has a  100 pack-year smoking history. She has never used smokeless tobacco. She reports that she does not drink alcohol or use illicit drugs.   Family History:  The patient's ***family history includes Diabetes in her daughter; Heart disease in her father; Heart disease (age of onset: 45) in her mother; Hyperlipidemia in her father; Hypertension in her father; Peripheral vascular disease in her daughter.    ROS:   Please see the history of present illness.   ROS    PHYSICAL EXAM: VS:  There were no vitals taken for this visit.    Wt Readings from Last 3 Encounters:  04/26/14 168 lb 14 oz (76.6 kg)  04/23/14 172 lb 6.4 oz (78.2 kg)  12/29/13 179 lb (81.194 kg)     GEN: Well nourished, well developed, in no acute distress HEENT: normal Neck: *** JVD, ***carotid bruits, no masses Cardiac:  Normal S1/S2, ***RRR; *** murmur ***, *** no rubs or gallops, {NUMBERS; 1+ TO 4+, TRACE/RARE:14493} edema  Respiratory:  ***clear to auscultation bilaterally, no wheezing, rhonchi or rales. GI: ***soft, nontender, nondistended, + BS MS: no deformity or atrophy Skin: warm and dry  Neuro:  CNs II-XII intact, Strength and sensation are intact Psych: Normal affect   EKG:  EKG {ACTION; IS/IS AYT:01601093} ordered today.  It demonstrates:   ***   Recent Labs: 04/15/2014: Magnesium 1.7; Pro B Natriuretic peptide (BNP) 230.0*; TSH 0.939 04/26/2014: B Natriuretic Peptide 294.0* 04/27/2014: ALT 29 04/28/2014: Platelets 210 04/29/2014: BUN 10; Creatinine 0.69; Hemoglobin 7.4*; Potassium 3.6; Sodium 140    Lipid Panel No results found for: CHOL, TRIG, HDL, CHOLHDL, VLDL, LDLCALC, LDLDIRECT    ASSESSMENT AND PLAN:  Coronary artery disease involving native coronary artery of native heart without angina pectoris  Ischemic cardiomyopathy  Chronic combined systolic and diastolic congestive heart failure  Carotid stenosis, bilateral  Abdominal aortic aneurysm  SVT (supraventricular tachycardia)  Essential  hypertension  HLD (hyperlipidemia)  COPD GOLD IV/ 02 dep  Diarrhea ***   Current medicines are reviewed at length with the patient today.  The patient {ACTIONS; HAS/DOES NOT ATFT:73220}  concerns regarding medicines.  The following changes have been made:  {PLAN; NO CHANGE:13088:s}  Labs/ tests ordered today include: *** No orders of the defined types were placed in this encounter.     Disposition:   FU with ***   Signed, Richardson Dopp, PA-C, MHS 05/01/2014 5:00 PM    [facility] [address], [place], [place]  [address] Phone: [phone]; Fax: [phone]

## 2014-05-02 ENCOUNTER — Encounter: Payer: Medicare Other | Admitting: Physician Assistant

## 2014-05-03 NOTE — Progress Notes (Signed)
This encounter was created in error - please disregard.

## 2014-05-04 ENCOUNTER — Encounter: Payer: Self-pay | Admitting: Physician Assistant

## 2014-05-04 ENCOUNTER — Ambulatory Visit (INDEPENDENT_AMBULATORY_CARE_PROVIDER_SITE_OTHER): Payer: Medicare Other | Admitting: Physician Assistant

## 2014-05-04 VITALS — BP 100/62 | HR 98 | Ht 65.0 in

## 2014-05-04 DIAGNOSIS — I10 Essential (primary) hypertension: Secondary | ICD-10-CM

## 2014-05-04 DIAGNOSIS — I471 Supraventricular tachycardia, unspecified: Secondary | ICD-10-CM

## 2014-05-04 DIAGNOSIS — I255 Ischemic cardiomyopathy: Secondary | ICD-10-CM | POA: Diagnosis not present

## 2014-05-04 DIAGNOSIS — I2510 Atherosclerotic heart disease of native coronary artery without angina pectoris: Secondary | ICD-10-CM

## 2014-05-04 DIAGNOSIS — E785 Hyperlipidemia, unspecified: Secondary | ICD-10-CM

## 2014-05-04 LAB — BASIC METABOLIC PANEL
BUN: 14 mg/dL (ref 6–23)
CALCIUM: 9.3 mg/dL (ref 8.4–10.5)
CO2: 42 meq/L — AB (ref 19–32)
Chloride: 88 mEq/L — ABNORMAL LOW (ref 96–112)
Creatinine, Ser: 0.83 mg/dL (ref 0.40–1.20)
GFR: 71.67 mL/min (ref >60.00)
Glucose, Bld: 131 mg/dL — ABNORMAL HIGH (ref 70–99)
Potassium: 3.7 mEq/L (ref 3.5–5.1)
Sodium: 137 mEq/L (ref 135–145)

## 2014-05-04 NOTE — Assessment & Plan Note (Signed)
Continue statin. 

## 2014-05-04 NOTE — Assessment & Plan Note (Signed)
Blood pressure is on the low side. No changes to current therapy. Check a basic metabolic panel to assess volume status.Marland Kitchen

## 2014-05-04 NOTE — Progress Notes (Signed)
Patient ID: [name] [name], female   DOB: November 16, 1940, 74 y.o.   MRN: [id number]    Date:  05/04/2014   ID:  [name] [name], DOB [birth year], MRN [id number]  PCP:  Sherrie Mustache, MD  Primary Cardiologist:  Stanford Breed   No chief complaint on file.    History of Present Illness: [name] [name] is a 74 y.o. female who was seen in the office on 04/15/14 by Dr. Stanford Breed who recommended hospitalization for acute on chronic heart failure.  She underwent cardiac catheterization 04/21/2014 with successful rotational atherectomy and stenting of the left circumflex. She was treated with Plavix and aspirin. She has residual totally occluded ostial LAD with left to left collaterals, small nondominant RCA, a large complex abdominal aortic aneurysm with severe distal aortic disease and probable high-grade ostial disease in the bilateral iliacs. 2-D echo EF 40-45%.  Patient also has history of SVT treated with amiodarone, hypertension, COPD, bladder cancer, and cerebrovascular disease. She has history of a ventricular tachycardia in 2012 that was evaluated by Dr. Lovena Le who recommended beta blocker therapy. She's had an unsuccessful attempt at stenting of the carotid on the left.  Is not on a beta blocker due to severe COPD.  Patient was admitted again on March 1 with nausea, vomiting, diarrhea.  She was treated for dehydration and possible viral gastroenteritis. She was C. difficile negative. and subsequently discharged on March 4 to the [facility] for rehabilitation.  She presents today for posthospital follow-up states the last couple of days she's feels that she is getting stronger.  The diarrhea has improved. She is chronically short of breath on home oxygen but denies orthopnea.   She also denies nausea, vomiting, fever, chest pain, dizziness, PND, cough, congestion, abdominal pain, hematochezia, melena, lower extremity edema.  Wt Readings from Last 3 Encounters:  04/26/14 168 lb 14 oz (76.6 kg)  04/23/14 172 lb  6.4 oz (78.2 kg)  12/29/13 179 lb (81.194 kg)     Past Medical History  Diagnosis Date  . Hypertension   . Hyperlipidemia   . Cerebrovascular disease, unspecified   . Unspecified glaucoma   . Obesity, unspecified   . Coronary artery disease     cath 04/21/14:severe multivessel disease with a left dominant coronary system, chronic occlusion of the LAD, and heavily calcified severe stenosis of the proximal left circumflex.  She underwent rotational atherectomy and stenting of the left circumflex  . Ventricular tachycardia   . SVT (supraventricular tachycardia)   . AAA (abdominal aortic aneurysm)   . COPD (chronic obstructive pulmonary disease) 2013    [place] of breath     WITH ACTIVITY--USES OXYGEN AS NEEDED  2&1/2 L PER NASAL CANNUL  . Complication of anesthesia     PT STATES SHE CAN NOT BE PUT TO SLEEP BECAUSE OF HER LUNG PROBLEMS  . Carotid artery occlusion   . Anemia   . CHF (congestive heart failure)   . Abdominal aneurysm     "hasn't been repaired" (10/20/2013)  . Pneumonia X ~ 2  . On home oxygen therapy     "4L; 24/7" (10/20/2013)  . GERD (gastroesophageal reflux disease)   . Migraines     "before tubal ligation I had them alot; seldom have one anymore" (10/20/2013)  . Arthritis     "knees mainly" (10/20/2013)  . Bladder cancer 2009  . Atrial fibrillation   . Bell palsy 2014    Current Outpatient Prescriptions  Medication Sig Dispense Refill  .  acetaminophen (TYLENOL) 325 MG tablet Take 2 tablets (650 mg total) by mouth every 4 (four) hours as needed for headache or mild pain. 60 tablet 3  . ALPRAZolam (XANAX) 0.25 MG tablet Take 0.25 mg by mouth at bedtime as needed for anxiety.    Marland Kitchen amiodarone (PACERONE) 200 MG tablet Take 200 mg by mouth at bedtime.     Marland Kitchen arformoterol (BROVANA) 15 MCG/2ML NEBU Take 2 mLs (15 mcg total) by nebulization 2 (two) times daily. 120 mL 3  . aspirin EC 81 MG tablet Take 81 mg by mouth at bedtime.    . clopidogrel  (PLAVIX) 75 MG tablet Take 1 tablet (75 mg total) by mouth at bedtime.    Marland Kitchen Dextromethorphan-Guaifenesin ([place] FAST-MAX DM MAX) 5-100 MG/5ML LIQD Take 5 mLs by mouth every 12 (twelve) hours as needed (for congestion). 237 mL 0  . diltiazem (CARDIZEM CD) 300 MG 24 hr capsule TAKE 1 CAPSULE IN THE EVENING. 30 capsule 1  . ferrous sulfate 325 (65 FE) MG tablet Take 1 tablet (325 mg total) by mouth 2 (two) times daily with a meal.  3  . furosemide (LASIX) 40 MG tablet Take 1 tablet (40 mg total) by mouth daily. 30 tablet 3  . ipratropium (ATROVENT) 0.02 % nebulizer solution USE 1 VIAL IN NEBULIZER EVERY 6 HOURS. 300 mL 1  . levalbuterol (XOPENEX HFA) 45 MCG/ACT inhaler Inhale 2 puffs into the lungs every 4 (four) hours as needed for wheezing. 15 g 6  . loperamide (IMODIUM) 2 MG capsule Take 1 capsule (2 mg total) by mouth every 6 (six) hours as needed for diarrhea or loose stools. 30 capsule 0  . nitroGLYCERIN (NITROSTAT) 0.4 MG SL tablet Place 0.4 mg under the tongue every 5 (five) minutes as needed for chest pain (may repeat x3). Chest pain    . nystatin (MYCOSTATIN) 100000 UNIT/ML suspension Take 5 mLs by mouth 4 (four) times daily.     Marland Kitchen omeprazole (PRILOSEC) 20 MG capsule Take 20 mg by mouth daily.    . pantoprazole (PROTONIX) 40 MG tablet TAKE 1 TABLET BY MOUTH ONCE DAILY 30-60 MINTUES BEFORE FIRST MEAL OF THE DAY. 30 tablet 3  . potassium chloride SA (K-DUR,KLOR-CON) 20 MEQ tablet Take 40 mEq by mouth daily.     . pravastatin (PRAVACHOL) 80 MG tablet Take 80 mg by mouth at bedtime.    . predniSONE (DELTASONE) 1 MG tablet 2 tablets for 3 days 1 tablet for 3 days then discontinue    . promethazine (PHENERGAN) 25 MG tablet Take 25 mg by mouth every 4 (four) hours as needed for nausea.     . ranitidine (ZANTAC) 150 MG capsule Take 150 mg by mouth at bedtime.    . tamsulosin (FLOMAX) 0.4 MG CAPS capsule Take 1 capsule (0.4 mg total) by mouth daily. 30 capsule   . Travoprost, BAK Free, (TRAVATAN)  0.004 % SOLN ophthalmic solution Place 1 drop into both eyes at bedtime.     No current facility-administered medications for this visit.   Facility-Administered Medications Ordered in Other Visits  Medication Dose Route Frequency Provider Last Rate Last Dose  . mitomycin (MUTAMYCIN) chemo injection 40 mg  40 mg Bladder Instillation Once Festus Aloe, MD        Allergies:    Allergies  Allergen Reactions  . Albuterol Other (See Comments)    Jittery/shakiness  . Levaquin [Levofloxacin In D5w] Nausea Only  . Sulfa Antibiotics Nausea Only    Social History:  The  patient  reports that she quit smoking about 2 years ago. Her smoking use included Cigarettes. She has a 100 pack-year smoking history. She has never used smokeless tobacco. She reports that she does not drink alcohol or use illicit drugs.   Family history:   Family History  Problem Relation Age of Onset  . Heart disease Mother 71  . Heart disease Father   . Hyperlipidemia Father   . Hypertension Father   . Diabetes Daughter   . Peripheral vascular disease Daughter     ROS:  Please see the history of present illness.  All other systems reviewed and negative.   PHYSICAL EXAM: VS:  BP 100/62 mmHg  Pulse 98  Ht 5\' 5"  (1.651 m)  Wt  Obese, well developed, in no acute distress.  She is in a wheelchair and appears relatively weak. HEENT: Pupils are equal round react to light accommodation extraocular movements are intact.  Neck: no JVDNo cervical lymphadenopathy. Cardiac: Regular rate and rhythm without murmurs rubs or gallops. Lungs:  clear to auscultation bilaterally, no wheezing, rhonchi or rales Abd: soft, nontender, positive bowel sounds all quadrants,  Ext: no lower extremity edema.  2+ radial and 1+ dorsalis pedis pulses. Skin: warm and dry Neuro:  Grossly normal  EKG:  Normal sinus rhythm rate 90 bpm with a right bundle branch block.     ASSESSMENT AND PLAN:  Problem List Items Addressed This Visit     SVT (supraventricular tachycardia)    Continue amiodarone and Cardizem      Ischemic cardiomyopathy    Patient does not appear volume overloaded. We'll check a basic metabolic panel and evaluate creatinine for dehydration.  She is not able to stand up for orthostatic blood pressure.  Accordingly weights from the [facility] are stable. And last was 172 pounds yesterday      HLD (hyperlipidemia)    Continue statin.         Essential hypertension    Blood pressure is on the low side. No changes to current therapy. Check a basic metabolic panel to assess volume status.Marland Kitchen      CAD (coronary artery disease) - Primary    No complaints of chest pain.  Is taking aspirin and Plavix.      Relevant Orders   EKG 26-EBRA   Basic metabolic panel

## 2014-05-04 NOTE — Patient Instructions (Signed)
Your physician recommends that you have lab work today: [facility]  Your physician recommends that you schedule a follow-up appointment in: 62 month with Dr. Stanford Breed  Your physician recommends that you continue on your current medications as directed. Please refer to the Current Medication list given to you today.

## 2014-05-04 NOTE — Assessment & Plan Note (Addendum)
Patient does not appear volume overloaded. We'll check a basic metabolic panel and evaluate creatinine for dehydration.  She is not able to stand up for orthostatic blood pressure.  Accordingly weights from the [facility] are stable. And last was 172 pounds yesterday

## 2014-05-04 NOTE — Assessment & Plan Note (Signed)
Continue amiodarone and Cardizem

## 2014-05-04 NOTE — Assessment & Plan Note (Addendum)
No complaints of chest pain.  Is taking aspirin and Plavix.

## 2014-05-09 ENCOUNTER — Inpatient Hospital Stay: Payer: Medicare Other | Admitting: Internal Medicine

## 2014-05-12 ENCOUNTER — Telehealth: Payer: Self-pay | Admitting: Cardiology

## 2014-05-12 NOTE — Telephone Encounter (Signed)
Yes - patient needs an OV in 1 month with Dr. Stanford Breed her last OV note with B. Hager PA  Routed to T. Respus, scheduling department, to contact patient/wife/facility to arrange OV with Dr. Stanford Breed

## 2014-05-12 NOTE — Telephone Encounter (Signed)
Left message for Inez Catalina at pt's facility to call back and schedule pt's 1 month f/u appt w/ Dr. Stanford Breed or PA.

## 2014-05-12 NOTE — Telephone Encounter (Signed)
New Message       Scheduler from pt's facility calling stating that husband was told when they left pt's last appt that someone would call them with an appt for 1 month from when she was last seen. There is no recall in Epic and no appt was made at time of checkout. They are wanting to know if pt needs an appt or not. Please call back and advise.

## 2014-06-06 ENCOUNTER — Inpatient Hospital Stay: Payer: Medicare Other | Admitting: Internal Medicine

## 2014-06-08 ENCOUNTER — Ambulatory Visit: Payer: Medicare Other | Admitting: Cardiology

## 2014-06-20 ENCOUNTER — Other Ambulatory Visit (HOSPITAL_COMMUNITY): Payer: Medicare Other

## 2014-06-20 ENCOUNTER — Encounter: Payer: Self-pay | Admitting: Vascular Surgery

## 2014-06-20 ENCOUNTER — Ambulatory Visit: Payer: Medicare Other | Admitting: Family

## 2014-06-21 ENCOUNTER — Ambulatory Visit: Payer: Self-pay | Admitting: Family

## 2014-06-21 ENCOUNTER — Other Ambulatory Visit (HOSPITAL_COMMUNITY): Payer: Self-pay

## 2014-08-12 ENCOUNTER — Other Ambulatory Visit: Payer: Self-pay | Admitting: Cardiology

## 2014-08-18 ENCOUNTER — Other Ambulatory Visit: Payer: Self-pay | Admitting: Cardiology

## 2014-08-18 DIAGNOSIS — I714 Abdominal aortic aneurysm, without rupture, unspecified: Secondary | ICD-10-CM

## 2014-08-24 ENCOUNTER — Other Ambulatory Visit (HOSPITAL_COMMUNITY): Payer: Medicare Other

## 2014-09-07 ENCOUNTER — Ambulatory Visit (INDEPENDENT_AMBULATORY_CARE_PROVIDER_SITE_OTHER)
Admission: RE | Admit: 2014-09-07 | Discharge: 2014-09-07 | Disposition: A | Discharge status: Home / Self Care | Payer: Medicare Other | Source: Ambulatory Visit | Attending: Internal Medicine | Admitting: Internal Medicine

## 2014-09-07 ENCOUNTER — Ambulatory Visit (INDEPENDENT_AMBULATORY_CARE_PROVIDER_SITE_OTHER): Payer: Medicare Other | Admitting: Internal Medicine

## 2014-09-07 ENCOUNTER — Encounter: Payer: Self-pay | Admitting: Internal Medicine

## 2014-09-07 ENCOUNTER — Other Ambulatory Visit: Payer: Self-pay | Admitting: Internal Medicine

## 2014-09-07 VITALS — BP 112/64 | HR 104 | Ht 65.0 in | Wt 160.0 lb

## 2014-09-07 DIAGNOSIS — J9612 Chronic respiratory failure with hypercapnia: Secondary | ICD-10-CM

## 2014-09-07 DIAGNOSIS — J449 Chronic obstructive pulmonary disease, unspecified: Secondary | ICD-10-CM

## 2014-09-07 MED ORDER — BUDESONIDE-FORMOTEROL FUMARATE 160-4.5 MCG/ACT IN AERO: 2.0000 | INHALATION_SPRAY | Freq: Two times a day (BID) | RESPIRATORY_TRACT | Status: AC

## 2014-09-07 NOTE — Progress Notes (Signed)
Subjective:    Patient ID: [name] [name], female    DOB: 07/25/1940    MRN: [id number]  Brief patient profile:  36 yowf quit smoking  04/2011 dx'd with GOLD  III COPD in 2004 and GOLD IV in 2014   History of Present Illness  10/29/2013 post hosp f/u ov/[name] [name] re: weak p admit / 02 3lpm  Chief Complaint  Patient presents with  . HFU  6 x 5mg  prednisone this am tapering off "always seems to help but wears off after a week.  No purulent sputum continues with congested cough ? Has flutter  On symbicort but hfa very poor, has neb. No sob at rest or noct symptoms >>change to pulmicort /perforomist   Home from snf in March on hospice x 2 weeks "they canceled all my appointments"   09/07/2014 f/u ov/[name] [name] re: GOLD IV copd/ 02 dep / very poor insight into problems/meds  Chief Complaint  Patient presents with  . Follow-up    Pt states her breathing is unchanged since the last visit. She is using oxygen 4.5 lpm at home and is 96%4lpm pulsed today at rest.    symbicort 2bid and ipratropium qid neb and bid xopenex hfa Not able to confirm any other meds / multiple changes since last ov   No obvious day to day or daytime variability or assoc chronic cough or cp or chest tightness, subjective wheeze or overt sinus or hb symptoms. No unusual exp hx or h/o childhood pna/ asthma or knowledge of premature birth.  Sleeping ok without nocturnal  or early am exacerbation  of respiratory  c/o's or need for noct saba. Also denies any obvious fluctuation of symptoms with weather or environmental changes or other aggravating or alleviating factors except as outlined above   Current Medications, Allergies, Complete Past Medical History, Past Surgical History, Family History, and Social History were reviewed in [employer] record.  ROS  The following are not active complaints unless bolded sore throat, dysphagia, dental problems, itching, sneezing,  nasal congestion or excess/ purulent secretions,  ear ache,   fever, chills, sweats, unintended wt loss, classically pleuritic or exertional cp, hemoptysis,  orthopnea pnd or leg swelling, presyncope, palpitations, abdominal pain, anorexia, nausea, vomiting, diarrhea  or change in bowel or bladder habits, change in stools or urine, dysuria,hematuria,  rash, arthralgias, visual complaints, headache, numbness, weakness or ataxia or problems with walking or coordination,  change in mood/affect or memory.                  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)  HYPERLIPIDEMIA (ICD-272.4)  HYPERTENSION (ICD-401.9)  CEREBROVASCULAR DISEASE (ICD-437.9)  - 80% R Carotid stenosis - see note by Bradham11/16/11  GLAUCOMA (ICD-365.9)  OBESITY (ICD-278.00)  COPD (ICD-496)  - PFT's 12/10/2002 FEV1 .85(37%) ratio 47 and 11 % better p B2, DLC0 42%  - Walking sats January 25, 2010 > desat even on 2lpm > rec 2.5 lpm 24 h per day  -Med Calendar 01/01/2013 , 11/18/2013       Objective:   Physical Exam    GEN: elderly w/c bound, chronically ill appearing  In wheelchair ,  4lpm   Wt Readings from Last 3 Encounters:  09/07/14 160 lb (72.576 kg)  04/26/14 168 lb 14 oz (76.6 kg)  04/23/14 172 lb 6.4 oz (78.2 kg)    Vital signs reviewed    HEENT mild turbinate edema.  Oropharynx no thrush or excess pnd or cobblestoning.  No JVD or  cervical adenopathy. Mild accessory muscle hypertrophy. Trachea midline, nl thryroid. Chest was hyperinflated by percussion with diminished breath sounds and moderate increased exp time without wheeze. Hoover sign positive at mid inspiration. Regular rate and rhythm without murmur gallop or rub or increase P2 or edema.  Abd: no hsm, nl excursion. Ext warm without cyanosis or clubbing. Skin with classic steroid ecchymoses both UEs        CXR PA and Lateral:   09/07/2014 :     I personally reviewed images and agree with radiology impression as follows:    No significant change in probable bibasilar  scarring. Underlying emphysematous changes are stable.        Assessment & Plan:

## 2014-09-07 NOTE — Patient Instructions (Signed)
Please remember to go to the  x-ray department downstairs for your tests - we will call you with the results when they are available.  See Tammy NP in 6  weeks with all your medications, even over the counter meds, separated in two separate bags, the ones you take no matter what vs the ones you stop once you feel better and take only as needed when you feel you need them.   Tammy  will generate for you a new user friendly medication calendar that will put [place] all on the same page re: your medication use.     Without this process, it simply isn't possible to assure that we are providing  your outpatient care  with  the attention to detail we feel you deserve.   If we cannot assure that you're getting that kind of care,  then we cannot manage your problem effectively from this clinic.  Once you have seen Tammy and we are sure that we're all on the same page with your medication use she will arrange follow up with me.

## 2014-09-08 NOTE — Progress Notes (Signed)
Quick Note:  Spoke with pt and notified of results per Dr. Wert. Pt verbalized understanding and denied any questions.  ______ 

## 2014-09-11 ENCOUNTER — Encounter: Payer: Self-pay | Admitting: Internal Medicine

## 2014-09-11 NOTE — Assessment & Plan Note (Addendum)
-  Walking sats January 25, 2010 > desat even on 2lpm   -10/25/2011  Sat 90% at rest >  Walked 2lpm x one lap @ 185 stopped due to  Sob/ fatigue but no desat so changed to ok to leave off at rest -05/26/2012   Walked 4lp  x one half lap = 90 ft,  stopped due to  Fatigue, no desat  -12/14/2012   Walked 2lpm x one lap @ 185 stopped due to  desat  To 85% - HC03 42  05/04/14   4lpm 02 as of 09/07/2014 -amiodarone rx noted and certainly a concern in this setting - low threshold to d/c  Adequate control on present rx, reviewed > no change in rx needed esp as long as she remains w/c bound at baseline( so low ventilatory demand keeps inhaled 02 relatively concetrated) and saturating low 90's

## 2014-09-11 NOTE — Assessment & Plan Note (Signed)
-   PFT's 12/10/2002 FEV1 .85(37%) ratio 47 and 11 % better p B2, DLC0 42%  - PFTs 12/14/2012  FEV1 0.49 (21%) improves by 31%  - Rehab rec 05/28/12 > pt declined - HFA 50% p coaching 08/24/2012  - med calendar 02/10/12 , 09/07/2012 , 01/01/2013 -changed symbicort to neb B/B  09/07/12 but worse so back to symbicort > retried 10/30/2013 as very poor hfa  -refer pulmonary rehab  05/10/2013 > pt terminated 08/02/13 as did not appear to be motivated per staff   Though somewhat paradoxic, when the lung fails to clear C02 properly and pC02 rises the lung then becomes a more efficient scavenger of C02 allowing lower work of breathing and  better C02 clearance albeit at a higher serum pC02 level - this is why pts can look a lot better than their ABG's would suggest and why it's so difficult to prognosticate endstage dz.  It's also why I strongly rec DNI status (ventilating pts down to a nl pC02 adversely affects this compensatory mechanism)    At this point nothing else to offer as I really don't have confidence I understand how she's taking her meds   I had an extended discussion with the patient reviewing all relevant studies completed to date and  lasting 15 to 20 minutes of a 25 minute visit    Each maintenance medication was reviewed in detail including most importantly the difference between maintenance and prns and under what circumstances the prns are to be triggered using an action plan format that is not reflected in the computer generated alphabetically organized AVS.    Please see instructions for details which were reviewed in writing and the patient given a copy highlighting the part that I personally wrote and discussed at today's ov.

## 2014-09-12 ENCOUNTER — Ambulatory Visit (HOSPITAL_COMMUNITY): Payer: Medicare Other | Attending: Cardiology

## 2014-09-12 ENCOUNTER — Telehealth: Payer: Self-pay

## 2014-09-12 DIAGNOSIS — I714 Abdominal aortic aneurysm, without rupture, unspecified: Secondary | ICD-10-CM

## 2014-09-12 DIAGNOSIS — J449 Chronic obstructive pulmonary disease, unspecified: Secondary | ICD-10-CM | POA: Diagnosis not present

## 2014-09-12 DIAGNOSIS — I700 Atherosclerosis of aorta: Secondary | ICD-10-CM | POA: Diagnosis not present

## 2014-09-12 DIAGNOSIS — E785 Hyperlipidemia, unspecified: Secondary | ICD-10-CM | POA: Diagnosis not present

## 2014-09-12 DIAGNOSIS — I2510 Atherosclerotic heart disease of native coronary artery without angina pectoris: Secondary | ICD-10-CM | POA: Diagnosis not present

## 2014-09-12 DIAGNOSIS — I10 Essential (primary) hypertension: Secondary | ICD-10-CM | POA: Diagnosis not present

## 2014-09-12 NOTE — Telephone Encounter (Signed)
Pt in for ECHO and gel bottle fell on her left hand and caused a skin tear.  Pt was evaluated by a RN, at time skin tear had stopped bleeding and bruise was forming under spot.  Pt stated she regularly took ASA daily, no other Anti-coag. RN put antibiotic ointment and Band-Aid on pt hand told to call our office is have any questions.  Pt and family member expressed understanding and not questions at this time.

## 2014-10-18 ENCOUNTER — Other Ambulatory Visit (INDEPENDENT_AMBULATORY_CARE_PROVIDER_SITE_OTHER): Payer: Medicare Other

## 2014-10-18 ENCOUNTER — Encounter: Payer: Self-pay | Admitting: Adult Health

## 2014-10-18 ENCOUNTER — Ambulatory Visit (INDEPENDENT_AMBULATORY_CARE_PROVIDER_SITE_OTHER): Payer: Medicare Other | Admitting: Adult Health

## 2014-10-18 VITALS — BP 104/62 | HR 102 | Temp 97.5°F | Ht 65.0 in | Wt 162.0 lb

## 2014-10-18 DIAGNOSIS — J449 Chronic obstructive pulmonary disease, unspecified: Secondary | ICD-10-CM

## 2014-10-18 DIAGNOSIS — I5042 Chronic combined systolic (congestive) and diastolic (congestive) heart failure: Secondary | ICD-10-CM | POA: Diagnosis not present

## 2014-10-18 DIAGNOSIS — J9612 Chronic respiratory failure with hypercapnia: Secondary | ICD-10-CM

## 2014-10-18 LAB — BRAIN NATRIURETIC PEPTIDE: PRO B NATRI PEPTIDE: 288 pg/mL — AB (ref 0.0–100.0)

## 2014-10-18 LAB — BASIC METABOLIC PANEL
BUN: 16 mg/dL (ref 6–23)
CO2: 32 mEq/L (ref 19–32)
Calcium: 9.7 mg/dL (ref 8.4–10.5)
Chloride: 95 mEq/L — ABNORMAL LOW (ref 96–112)
Creatinine, Ser: 0.67 mg/dL (ref 0.40–1.20)
GFR: 91.64 mL/min (ref >60.00)
GLUCOSE: 110 mg/dL — AB (ref 70–99)
POTASSIUM: 3.8 meq/L (ref 3.5–5.1)
Sodium: 139 mEq/L (ref 135–145)

## 2014-10-18 NOTE — Progress Notes (Signed)
Subjective:    Patient ID: [name] [name], female    DOB: 1941/01/01    MRN: [id number]  Brief patient profile:  50 yowf quit smoking  04/2011 dx'd with GOLD  III COPD in 2004 and GOLD IV in 2014   History of Present Illness  10/29/2013 post hosp f/u ov/Wert re: weak p admit / 02 3lpm  Chief Complaint  Patient presents with  . HFU  6 x 5mg  prednisone this am tapering off "always seems to help but wears off after a week.  No purulent sputum continues with congested cough ? Has flutter  On symbicort but hfa very poor, has neb. No sob at rest or noct symptoms >>change to pulmicort /perforomist   Home from snf in March on hospice x 2 weeks "they canceled all my appointments"   09/07/2014 f/u ov/Wert re: GOLD IV copd/ 02 dep / very poor insight into problems/meds  Chief Complaint  Patient presents with  . Follow-up    Pt states her breathing is unchanged since the last visit. She is using oxygen 4.5 lpm at home and is 96%4lpm pulsed today at rest.    symbicort 2bid and ipratropium qid neb and bid xopenex hfa Not able to confirm any other meds / multiple changes since last ov  > No changes  10/18/2014 Follow up and Medication Calendar GOLD IV copd/ 02 dep Patient presents for a one-month follow-up and medication review We reviewed all her medications and organized them into a medication calendar with patient education Appears patient is taking correctly. She says overall her breathing is essentially unchanged. She says she does get short of breath with minimal activity. She denies any increased cough, congestion, fever, or leg swelling. Chest x-ray last visit showed chronic changes.     Current Medications, Allergies, Complete Past Medical History, Past Surgical History, Family History, and Social History were reviewed in [employer] record.  ROS  The following are not active complaints unless bolded sore throat, dysphagia, dental problems, itching, sneezing,   nasal congestion or excess/ purulent secretions, ear ache,   fever, chills, sweats, unintended wt loss, classically pleuritic or exertional cp, hemoptysis,  orthopnea pnd or leg swelling, presyncope, palpitations, abdominal pain, anorexia, nausea, vomiting, diarrhea  or change in bowel or bladder habits, change in stools or urine, dysuria,hematuria,  rash, arthralgias, visual complaints, headache, numbness, weakness or ataxia or problems with walking or coordination,  change in mood/affect or memory.                  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)  HYPERLIPIDEMIA (ICD-272.4)  HYPERTENSION (ICD-401.9)  CEREBROVASCULAR DISEASE (ICD-437.9)  - 80% R Carotid stenosis - see note by Bradham11/16/11  GLAUCOMA (ICD-365.9)  OBESITY (ICD-278.00)  COPD (ICD-496)  - PFT's 12/10/2002 FEV1 .85(37%) ratio 47 and 11 % better p B2, DLC0 42%  - Walking sats January 25, 2010 > desat even on 2lpm > rec 2.5 lpm 24 h per day  -Med Calendar 01/01/2013 , 11/18/2013 , 10/18/2014       Objective:   Physical Exam    GEN: elderly w/c bound, chronically ill appearing  In wheelchair ,  4lpm    Vital signs reviewed    HEENT mild turbinate edema.  Oropharynx no thrush or excess pnd or cobblestoning.  No JVD or cervical adenopathy. Mild accessory muscle hypertrophy. Trachea midline, nl thryroid. Chest was hyperinflated by percussion with diminished breath sounds and moderate increased exp time without wheeze. Hoover sign  positive at mid inspiration. Regular rate and rhythm without murmur gallop or rub or increase P2 or edema.  Abd: no hsm, nl excursion. Ext warm without cyanosis or clubbing. Skin with classic steroid ecchymoses both UEs        CXR PA and Lateral:   09/07/2014 :        No significant change in probable bibasilar scarring. Underlying emphysematous changes are stable.        Assessment & Plan:

## 2014-10-18 NOTE — Assessment & Plan Note (Signed)
Compensated on present regimen Med calendar completed today  Plan Follow med calendar closely and bring to each visit.  Continue on current regimen  Labs today .  Follow up Dr. Melvyn Novas  In 6-8  weeks and As needed

## 2014-10-18 NOTE — Progress Notes (Signed)
Quick Note:  Called and spoke with pt. Reviewed results and recs. Pt voiced understanding and had no further questions. ______ 

## 2014-10-18 NOTE — Addendum Note (Signed)
Addended by: Osa Craver on: 10/18/2014 02:44 PM   Modules accepted: Orders

## 2014-10-18 NOTE — Assessment & Plan Note (Signed)
Compensated on oxygen Continue on oxygen to keep O2 saturations greater than 90%

## 2014-10-18 NOTE — Progress Notes (Signed)
Chart and office note reviewed in detail  > agree with a/p as outlined    

## 2014-10-18 NOTE — Patient Instructions (Signed)
Follow med calendar closely and bring to each visit.  Continue on current regimen  Labs today .  Follow up Dr. Melvyn Novas  In 6-8  weeks and As needed

## 2014-10-18 NOTE — Assessment & Plan Note (Signed)
Check BNP today Is encouraged on a low-salt diet.

## 2014-10-21 NOTE — Addendum Note (Signed)
Addended by: Osa Craver on: 10/21/2014 01:50 PM   Modules accepted: Orders, Medications

## 2014-11-11 ENCOUNTER — Inpatient Hospital Stay (HOSPITAL_COMMUNITY)
Admission: EM | Admit: 2014-11-11 | Discharge: 2014-11-26 | DRG: 207 | Disposition: E | Payer: Medicare Other | Attending: Internal Medicine | Admitting: Internal Medicine

## 2014-11-11 ENCOUNTER — Encounter (HOSPITAL_COMMUNITY): Payer: Self-pay | Admitting: Emergency Medicine

## 2014-11-11 ENCOUNTER — Emergency Department (HOSPITAL_COMMUNITY): Payer: Medicare Other

## 2014-11-11 DIAGNOSIS — Z8551 Personal history of malignant neoplasm of bladder: Secondary | ICD-10-CM | POA: Diagnosis not present

## 2014-11-11 DIAGNOSIS — I255 Ischemic cardiomyopathy: Secondary | ICD-10-CM | POA: Diagnosis present

## 2014-11-11 DIAGNOSIS — E785 Hyperlipidemia, unspecified: Secondary | ICD-10-CM | POA: Diagnosis present

## 2014-11-11 DIAGNOSIS — M1990 Unspecified osteoarthritis, unspecified site: Secondary | ICD-10-CM | POA: Diagnosis present

## 2014-11-11 DIAGNOSIS — Z7902 Long term (current) use of antithrombotics/antiplatelets: Secondary | ICD-10-CM

## 2014-11-11 DIAGNOSIS — Z79899 Other long term (current) drug therapy: Secondary | ICD-10-CM

## 2014-11-11 DIAGNOSIS — I4892 Unspecified atrial flutter: Secondary | ICD-10-CM | POA: Diagnosis present

## 2014-11-11 DIAGNOSIS — J441 Chronic obstructive pulmonary disease with (acute) exacerbation: Secondary | ICD-10-CM | POA: Diagnosis present

## 2014-11-11 DIAGNOSIS — J9621 Acute and chronic respiratory failure with hypoxia: Secondary | ICD-10-CM | POA: Diagnosis present

## 2014-11-11 DIAGNOSIS — Z8249 Family history of ischemic heart disease and other diseases of the circulatory system: Secondary | ICD-10-CM

## 2014-11-11 DIAGNOSIS — R0602 Shortness of breath: Secondary | ICD-10-CM

## 2014-11-11 DIAGNOSIS — Z7982 Long term (current) use of aspirin: Secondary | ICD-10-CM | POA: Diagnosis not present

## 2014-11-11 DIAGNOSIS — I10 Essential (primary) hypertension: Secondary | ICD-10-CM | POA: Diagnosis present

## 2014-11-11 DIAGNOSIS — J9612 Chronic respiratory failure with hypercapnia: Secondary | ICD-10-CM

## 2014-11-11 DIAGNOSIS — D539 Nutritional anemia, unspecified: Secondary | ICD-10-CM | POA: Diagnosis present

## 2014-11-11 DIAGNOSIS — I509 Heart failure, unspecified: Secondary | ICD-10-CM

## 2014-11-11 DIAGNOSIS — E538 Deficiency of other specified B group vitamins: Secondary | ICD-10-CM | POA: Diagnosis present

## 2014-11-11 DIAGNOSIS — K219 Gastro-esophageal reflux disease without esophagitis: Secondary | ICD-10-CM | POA: Diagnosis present

## 2014-11-11 DIAGNOSIS — I480 Paroxysmal atrial fibrillation: Secondary | ICD-10-CM | POA: Diagnosis present

## 2014-11-11 DIAGNOSIS — T50905A Adverse effect of unspecified drugs, medicaments and biological substances, initial encounter: Secondary | ICD-10-CM | POA: Diagnosis present

## 2014-11-11 DIAGNOSIS — Z9981 Dependence on supplemental oxygen: Secondary | ICD-10-CM

## 2014-11-11 DIAGNOSIS — Z7951 Long term (current) use of inhaled steroids: Secondary | ICD-10-CM | POA: Diagnosis not present

## 2014-11-11 DIAGNOSIS — E669 Obesity, unspecified: Secondary | ICD-10-CM | POA: Diagnosis present

## 2014-11-11 DIAGNOSIS — Z66 Do not resuscitate: Secondary | ICD-10-CM | POA: Diagnosis present

## 2014-11-11 DIAGNOSIS — Z8673 Personal history of transient ischemic attack (TIA), and cerebral infarction without residual deficits: Secondary | ICD-10-CM

## 2014-11-11 DIAGNOSIS — I714 Abdominal aortic aneurysm, without rupture, unspecified: Secondary | ICD-10-CM | POA: Diagnosis present

## 2014-11-11 DIAGNOSIS — F419 Anxiety disorder, unspecified: Secondary | ICD-10-CM | POA: Diagnosis present

## 2014-11-11 DIAGNOSIS — Z87891 Personal history of nicotine dependence: Secondary | ICD-10-CM | POA: Diagnosis not present

## 2014-11-11 DIAGNOSIS — Z95828 Presence of other vascular implants and grafts: Secondary | ICD-10-CM

## 2014-11-11 DIAGNOSIS — G9340 Encephalopathy, unspecified: Secondary | ICD-10-CM | POA: Diagnosis present

## 2014-11-11 DIAGNOSIS — J189 Pneumonia, unspecified organism: Secondary | ICD-10-CM | POA: Diagnosis not present

## 2014-11-11 DIAGNOSIS — I959 Hypotension, unspecified: Secondary | ICD-10-CM | POA: Diagnosis not present

## 2014-11-11 DIAGNOSIS — I5042 Chronic combined systolic (congestive) and diastolic (congestive) heart failure: Secondary | ICD-10-CM | POA: Diagnosis present

## 2014-11-11 DIAGNOSIS — T380X5A Adverse effect of glucocorticoids and synthetic analogues, initial encounter: Secondary | ICD-10-CM | POA: Diagnosis present

## 2014-11-11 DIAGNOSIS — Y95 Nosocomial condition: Secondary | ICD-10-CM | POA: Diagnosis present

## 2014-11-11 DIAGNOSIS — D509 Iron deficiency anemia, unspecified: Secondary | ICD-10-CM | POA: Diagnosis not present

## 2014-11-11 DIAGNOSIS — I248 Other forms of acute ischemic heart disease: Secondary | ICD-10-CM | POA: Diagnosis present

## 2014-11-11 DIAGNOSIS — J9690 Respiratory failure, unspecified, unspecified whether with hypoxia or hypercapnia: Secondary | ICD-10-CM

## 2014-11-11 DIAGNOSIS — H409 Unspecified glaucoma: Secondary | ICD-10-CM | POA: Diagnosis present

## 2014-11-11 DIAGNOSIS — R739 Hyperglycemia, unspecified: Secondary | ICD-10-CM | POA: Diagnosis present

## 2014-11-11 DIAGNOSIS — J9620 Acute and chronic respiratory failure, unspecified whether with hypoxia or hypercapnia: Secondary | ICD-10-CM

## 2014-11-11 DIAGNOSIS — Z515 Encounter for palliative care: Secondary | ICD-10-CM | POA: Diagnosis not present

## 2014-11-11 DIAGNOSIS — Z833 Family history of diabetes mellitus: Secondary | ICD-10-CM | POA: Diagnosis not present

## 2014-11-11 DIAGNOSIS — J9622 Acute and chronic respiratory failure with hypercapnia: Secondary | ICD-10-CM | POA: Diagnosis present

## 2014-11-11 DIAGNOSIS — I5023 Acute on chronic systolic (congestive) heart failure: Secondary | ICD-10-CM

## 2014-11-11 DIAGNOSIS — E876 Hypokalemia: Secondary | ICD-10-CM | POA: Diagnosis present

## 2014-11-11 LAB — BLOOD GAS, ARTERIAL
Acid-Base Excess: 9.3 mmol/L — ABNORMAL HIGH (ref 0.0–2.0)
Bicarbonate: 31.9 mEq/L — ABNORMAL HIGH (ref 20.0–24.0)
DELIVERY SYSTEMS: POSITIVE
DRAWN BY: 22223
Expiratory PAP: 6
FIO2: 40
Inspiratory PAP: 14
O2 Saturation: 96.8 %
Patient temperature: 37
TCO2: 13.9 mmol/L (ref 0–100)
pCO2 arterial: 64.1 mmHg (ref 35.0–45.0)
pH, Arterial: 7.355 (ref 7.350–7.450)
pO2, Arterial: 108 mmHg — ABNORMAL HIGH (ref 80.0–100.0)

## 2014-11-11 LAB — I-STAT TROPONIN, ED: Troponin i, poc: 0.01 ng/mL (ref 0.00–0.08)

## 2014-11-11 LAB — BLOOD GAS, VENOUS
ACID-BASE EXCESS: 10.6 mmol/L — AB (ref 0.0–2.0)
Bicarbonate: 31.9 mEq/L — ABNORMAL HIGH (ref 20.0–24.0)
FIO2: 100
O2 SAT: 86.6 %
PCO2 VEN: 80.9 mmHg — AB (ref 45.0–50.0)
TCO2: 13.9 mmol/L (ref 0–100)
pH, Ven: 7.286 (ref 7.250–7.300)
pO2, Ven: 59.9 mmHg — ABNORMAL HIGH (ref 30.0–45.0)

## 2014-11-11 LAB — CBC WITH DIFFERENTIAL/PLATELET
BASOS ABS: 0 10*3/uL (ref 0.0–0.1)
BASOS PCT: 0 %
EOS ABS: 0 10*3/uL (ref 0.0–0.7)
EOS PCT: 0 %
HCT: 37.3 % (ref 36.0–46.0)
Hemoglobin: 10.7 g/dL — ABNORMAL LOW (ref 12.0–15.0)
LYMPHS PCT: 9 %
Lymphs Abs: 1.3 10*3/uL (ref 0.7–4.0)
MCH: 27 pg (ref 26.0–34.0)
MCHC: 28.7 g/dL — ABNORMAL LOW (ref 30.0–36.0)
MCV: 94 fL (ref 78.0–100.0)
MONO ABS: 1.2 10*3/uL — AB (ref 0.1–1.0)
Monocytes Relative: 9 %
Neutro Abs: 11.6 10*3/uL — ABNORMAL HIGH (ref 1.7–7.7)
Neutrophils Relative %: 82 %
PLATELETS: 303 10*3/uL (ref 150–400)
RBC: 3.97 MIL/uL (ref 3.87–5.11)
RDW: 15.6 % — AB (ref 11.5–15.5)
WBC: 14.2 10*3/uL — ABNORMAL HIGH (ref 4.0–10.5)

## 2014-11-11 LAB — COMPREHENSIVE METABOLIC PANEL
ALT: 11 U/L — ABNORMAL LOW (ref 14–54)
AST: 18 U/L (ref 15–41)
Albumin: 3.3 g/dL — ABNORMAL LOW (ref 3.5–5.0)
Alkaline Phosphatase: 73 U/L (ref 38–126)
Anion gap: 9 (ref 5–15)
BUN: 9 mg/dL (ref 6–20)
CHLORIDE: 92 mmol/L — AB (ref 101–111)
CO2: 36 mmol/L — ABNORMAL HIGH (ref 22–32)
Calcium: 8.6 mg/dL — ABNORMAL LOW (ref 8.9–10.3)
Creatinine, Ser: 0.62 mg/dL (ref 0.44–1.00)
GFR calc Af Amer: >60 mL/min (ref >60)
Glucose, Bld: 128 mg/dL — ABNORMAL HIGH (ref 65–99)
POTASSIUM: 3.8 mmol/L (ref 3.5–5.1)
SODIUM: 137 mmol/L (ref 135–145)
Total Bilirubin: 0.6 mg/dL (ref 0.3–1.2)
Total Protein: 6.3 g/dL — ABNORMAL LOW (ref 6.5–8.1)

## 2014-11-11 LAB — I-STAT CG4 LACTIC ACID, ED
LACTIC ACID, VENOUS: 1.59 mmol/L (ref 0.5–2.0)
LACTIC ACID, VENOUS: 1.12 mmol/L (ref 0.5–2.0)

## 2014-11-11 LAB — PROTIME-INR
INR: 1.03 (ref 0.00–1.49)
Prothrombin Time: 13.7 seconds (ref 11.6–15.2)

## 2014-11-11 LAB — BRAIN NATRIURETIC PEPTIDE: B NATRIURETIC PEPTIDE 5: 549 pg/mL — AB (ref 0.0–100.0)

## 2014-11-11 MED ORDER — IPRATROPIUM-ALBUTEROL 0.5-2.5 (3) MG/3ML IN SOLN
3.0000 mL | Freq: Once | RESPIRATORY_TRACT | Status: AC
  Administered 2014-11-11: 3 mL via RESPIRATORY_TRACT
  Filled 2014-11-11: qty 3

## 2014-11-11 MED ORDER — ONDANSETRON HCL 4 MG PO TABS: 4.0000 mg | ORAL_TABLET | Freq: Four times a day (QID) | ORAL | Status: DC | PRN

## 2014-11-11 MED ORDER — VANCOMYCIN HCL IN DEXTROSE 1-5 GM/200ML-% IV SOLN
1000.0000 mg | Freq: Once | INTRAVENOUS | Status: AC
  Administered 2014-11-11: 1000 mg via INTRAVENOUS
  Filled 2014-11-11: qty 200

## 2014-11-11 MED ORDER — PANTOPRAZOLE SODIUM 40 MG PO TBEC
40.0000 mg | DELAYED_RELEASE_TABLET | Freq: Every day | ORAL | Status: DC
  Administered 2014-11-12 – 2014-11-13 (×2): 40 mg via ORAL
  Filled 2014-11-11 (×2): qty 1

## 2014-11-11 MED ORDER — BUDESONIDE-FORMOTEROL FUMARATE 160-4.5 MCG/ACT IN AERO
2.0000 | INHALATION_SPRAY | Freq: Two times a day (BID) | RESPIRATORY_TRACT | Status: DC
  Administered 2014-11-12 – 2014-11-14 (×5): 2 via RESPIRATORY_TRACT
  Filled 2014-11-11: qty 6

## 2014-11-11 MED ORDER — POTASSIUM CHLORIDE CRYS ER 20 MEQ PO TBCR
20.0000 meq | EXTENDED_RELEASE_TABLET | Freq: Every day | ORAL | Status: DC
  Administered 2014-11-12: 20 meq via ORAL
  Filled 2014-11-11: qty 1

## 2014-11-11 MED ORDER — PRAVASTATIN SODIUM 40 MG PO TABS
80.0000 mg | ORAL_TABLET | Freq: Every day | ORAL | Status: DC
  Administered 2014-11-11 – 2014-11-13 (×3): 80 mg via ORAL
  Filled 2014-11-11 (×3): qty 2

## 2014-11-11 MED ORDER — AMIODARONE HCL 200 MG PO TABS
200.0000 mg | ORAL_TABLET | Freq: Two times a day (BID) | ORAL | Status: DC
  Administered 2014-11-11 – 2014-11-13 (×3): 200 mg via ORAL
  Filled 2014-11-11 (×5): qty 1

## 2014-11-11 MED ORDER — PIPERACILLIN-TAZOBACTAM 3.375 G IVPB 30 MIN
3.3750 g | Freq: Once | INTRAVENOUS | Status: AC
  Administered 2014-11-11: 3.375 g via INTRAVENOUS
  Filled 2014-11-11: qty 50

## 2014-11-11 MED ORDER — ASPIRIN EC 81 MG PO TBEC
81.0000 mg | DELAYED_RELEASE_TABLET | Freq: Every day | ORAL | Status: DC
  Administered 2014-11-11 – 2014-11-13 (×3): 81 mg via ORAL
  Filled 2014-11-11 (×3): qty 1

## 2014-11-11 MED ORDER — DILTIAZEM HCL ER COATED BEADS 240 MG PO CP24
240.0000 mg | ORAL_CAPSULE | Freq: Every day | ORAL | Status: DC
  Administered 2014-11-12: 240 mg via ORAL
  Filled 2014-11-11 (×2): qty 1

## 2014-11-11 MED ORDER — CLOPIDOGREL BISULFATE 75 MG PO TABS
75.0000 mg | ORAL_TABLET | Freq: Every day | ORAL | Status: DC
  Administered 2014-11-11 – 2014-11-13 (×3): 75 mg via ORAL
  Filled 2014-11-11 (×3): qty 1

## 2014-11-11 MED ORDER — ONDANSETRON HCL 4 MG/2ML IJ SOLN: 4.0000 mg | Freq: Four times a day (QID) | INTRAMUSCULAR | Status: DC | PRN

## 2014-11-11 MED ORDER — CLONAZEPAM 0.5 MG PO TABS
0.5000 mg | ORAL_TABLET | Freq: Three times a day (TID) | ORAL | Status: DC | PRN
  Administered 2014-11-11 – 2014-11-14 (×3): 0.5 mg via ORAL
  Filled 2014-11-11 (×3): qty 1

## 2014-11-11 MED ORDER — FUROSEMIDE 40 MG PO TABS
40.0000 mg | ORAL_TABLET | ORAL | Status: DC
  Administered 2014-11-12 – 2014-11-14 (×2): 40 mg via ORAL
  Filled 2014-11-11 (×3): qty 1

## 2014-11-11 MED ORDER — IPRATROPIUM BROMIDE 0.02 % IN SOLN
0.5000 mg | RESPIRATORY_TRACT | Status: DC | PRN
  Administered 2014-11-13: 0.5 mg via RESPIRATORY_TRACT
  Filled 2014-11-11: qty 2.5

## 2014-11-11 MED ORDER — ISOSORBIDE MONONITRATE ER 30 MG PO TB24
30.0000 mg | ORAL_TABLET | Freq: Every morning | ORAL | Status: DC
  Administered 2014-11-12: 30 mg via ORAL
  Filled 2014-11-11 (×2): qty 1

## 2014-11-11 MED ORDER — METHYLPREDNISOLONE SODIUM SUCC 40 MG IJ SOLR
40.0000 mg | Freq: Four times a day (QID) | INTRAMUSCULAR | Status: DC
  Administered 2014-11-12 – 2014-11-14 (×9): 40 mg via INTRAVENOUS
  Filled 2014-11-11 (×9): qty 1

## 2014-11-11 MED ORDER — MIRTAZAPINE 15 MG PO TABS
7.5000 mg | ORAL_TABLET | Freq: Every day | ORAL | Status: DC
  Administered 2014-11-12 – 2014-11-13 (×2): 7.5 mg via ORAL
  Filled 2014-11-11 (×4): qty 1

## 2014-11-11 MED ORDER — ALBUTEROL SULFATE (2.5 MG/3ML) 0.083% IN NEBU
2.5000 mg | INHALATION_SOLUTION | Freq: Four times a day (QID) | RESPIRATORY_TRACT | Status: DC
  Administered 2014-11-12: 2.5 mg via RESPIRATORY_TRACT
  Filled 2014-11-11: qty 3

## 2014-11-11 MED ORDER — LORAZEPAM 2 MG/ML IJ SOLN
0.5000 mg | Freq: Once | INTRAMUSCULAR | Status: AC
  Administered 2014-11-11: 0.5 mg via INTRAVENOUS
  Filled 2014-11-11: qty 1

## 2014-11-11 MED ORDER — SODIUM CHLORIDE 0.9 % IJ SOLN
3.0000 mL | Freq: Two times a day (BID) | INTRAMUSCULAR | Status: DC
  Administered 2014-11-11 – 2014-11-14 (×5): 3 mL via INTRAVENOUS

## 2014-11-11 NOTE — ED Notes (Signed)
Awaiting Dr. Thomasene Lot to come out of pt room for notification of ABG critical value.

## 2014-11-11 NOTE — H&P (Signed)
[facility] History and Physical  [name] [name] UVO:[id number] DOB: [birth year]    PCP:   Sherrie Mustache, MD   Chief Complaint: SOB.  HPI: [name] [name] is an 74 y.o. female with hx of severe COPD on home oxygen, hx of CHF, known CAD, HTN, HLD, prior CVA, AAA, carotid stenosis, hx of VT, SVT, presented to the ER with progressive SOB, but no productive coughs, fever, or chills.   She had a venous blood gas, showing pH of 7.28, and a CXR showing new right sided infiltrate.  Her lactic acid was 1.59, and her WBC was 14K, with normal renal Fx tests. She was placed on Bipap, and felt better.  A repeat ABG showed 7/36/64/pOx=108 on Bipap.  She was started on IV Van/Zosyn for HCAP and COPD exacerbation with acute on chronic hypoxic and hypercarbic respiratory failure.  Hospitalist was asked to admit her for same.   Rewiew of Systems:  Constitutional: Negative for malaise, fever and chills. No significant weight loss or weight gain Eyes: Negative for eye pain, redness and discharge, diplopia, visual changes, or flashes of light. ENMT: Negative for ear pain, hoarseness, nasal congestion, sinus pressure and sore throat. No headaches; tinnitus, drooling, or problem swallowing. Cardiovascular: Negative for chest pain, palpitations, diaphoresis,  and peripheral edema. ; No orthopnea, PND Respiratory: Negative for hemoptysis,  and stridor. No pleuritic chestpain. Gastrointestinal: Negative for nausea, vomiting, diarrhea, constipation, abdominal pain, melena, blood in stool, hematemesis, jaundice and rectal bleeding.    Genitourinary: Negative for frequency, dysuria, incontinence,flank pain and hematuria; Musculoskeletal: Negative for back pain and neck pain. Negative for swelling and trauma.;  Skin: . Negative for pruritus, rash, abrasions, bruising and skin lesion.; ulcerations Neuro: Negative for headache, lightheadedness and neck stiffness. Negative for weakness, altered level of consciousness ,  altered mental status, extremity weakness, burning feet, involuntary movement, seizure and syncope.  Psych: negative for anxiety, depression, insomnia, tearfulness, panic attacks, hallucinations, paranoia, suicidal or homicidal ideation    Past Medical History  Diagnosis Date  . Hypertension   . Hyperlipidemia   . Cerebrovascular disease, unspecified   . Unspecified glaucoma   . Obesity, unspecified   . Coronary artery disease     cath 04/21/14:severe multivessel disease with a left dominant coronary system, chronic occlusion of the LAD, and heavily calcified severe stenosis of the proximal left circumflex.  She underwent rotational atherectomy and stenting of the left circumflex  . Ventricular tachycardia   . SVT (supraventricular tachycardia)   . AAA (abdominal aortic aneurysm)   . COPD (chronic obstructive pulmonary disease) 2013    [place] of breath     WITH ACTIVITY--USES OXYGEN AS NEEDED  2&1/2 L PER NASAL CANNUL  . Complication of anesthesia     PT STATES SHE CAN NOT BE PUT TO SLEEP BECAUSE OF HER LUNG PROBLEMS  . Carotid artery occlusion   . Anemia   . CHF (congestive heart failure)   . Abdominal aneurysm     "hasn't been repaired" (10/20/2013)  . Pneumonia X ~ 2  . On home oxygen therapy     "4L; 24/7" (10/20/2013)  . GERD (gastroesophageal reflux disease)   . Migraines     "before tubal ligation I had them alot; seldom have one anymore" (10/20/2013)  . Arthritis     "knees mainly" (10/20/2013)  . Bladder cancer 2009  . Atrial fibrillation   . Bell palsy 2014    Past Surgical History  Procedure Laterality Date  .  Cystoscopy    . Bladder tumor excision      resection of 5 bladder, and fulguration of 1 small bladder tumor  . Cystoscopy      cold cup bladder biopsy of five tumors, and fulguration of one tumor  . Wedge resection Right 1980's    Remote right lung  . Cystoscopy with biopsy  12/24/2011    Procedure: CYSTOSCOPY WITH BIOPSY;  Surgeon:  Fredricka Bonine, MD;  Location: [facility];  Service: Urology;  Laterality: N/A;  . Tubal ligation Bilateral 1980's  . Cataract extraction w/ intraocular lens  implant, bilateral Bilateral ~ 2011  . Cardiac catheterization  "several"    "too bad to put stents in"  . Left heart catheterization with coronary angiogram N/A 04/18/2014    Procedure: LEFT HEART CATHETERIZATION WITH CORONARY ANGIOGRAM;  Surgeon: Jolaine Artist, MD;  Location: [facility] CATH LAB;  Service: Cardiovascular;  Laterality: N/A;  . Percutaneous coronary rotoblator intervention (pci-r) N/A 04/21/2014    Procedure: PERCUTANEOUS CORONARY ROTOBLATOR INTERVENTION (PCI-R);  Surgeon: Blane Ohara, MD;  Location: [facility] CATH LAB;  Service: Cardiovascular;  Laterality: N/A;    Medications:  HOME MEDS: Prior to Admission medications   Medication Sig Start Date End Date Taking? Authorizing Provider  acetaminophen-codeine (TYLENOL #3) 300-30 MG per tablet Take 1 tablet by mouth every 4 (four) hours as needed for moderate pain.   Yes Historical Provider, MD  amiodarone (PACERONE) 200 MG tablet Take 200 mg by mouth 2 (two) times daily.  09/20/11  Yes Lelon Perla, MD  aspirin EC 81 MG tablet Take 81 mg by mouth at bedtime.   Yes Historical Provider, MD  budesonide-formoterol (SYMBICORT) 160-4.5 MCG/ACT inhaler Inhale 2 puffs into the lungs 2 (two) times daily. 09/07/14  Yes Tanda Rockers, MD  clonazePAM (KLONOPIN) 0.5 MG tablet Take 0.25-0.5 mg by mouth every 6 (six) hours as needed for anxiety.    Yes Historical Provider, MD  clopidogrel (PLAVIX) 75 MG tablet Take 1 tablet (75 mg total) by mouth at bedtime. 12/26/11  Yes Festus Aloe, MD  diltiazem (CARDIZEM CD) 300 MG 24 hr capsule TAKE 1 CAPSULE IN THE EVENING. 02/07/14  Yes Lelon Perla, MD  furosemide (LASIX) 40 MG tablet Take 1 tablet (40 mg total) by mouth daily. Patient taking differently: Take 40 mg by mouth every morning.  04/17/14  Yes Reyne Dumas, MD  guaiFENesin  (MUCINEX) 600 MG 12 hr tablet Take 1 tablet by mouth every 12 hours as needed for cough and congestion   Yes Historical Provider, MD  ipratropium (ATROVENT) 0.02 % nebulizer solution USE 1 VIAL IN NEBULIZER EVERY 6 HOURS. Patient taking differently: USE 1 VIAL IN NEBULIZER EVERY 4 HOURS FOR SHORTNESS OF BREATH/WHEEZING 03/14/14  Yes Tanda Rockers, MD  isosorbide mononitrate (IMDUR) 30 MG 24 hr tablet TAKE ONE TABLET BY MOUTH AT BEDTIME. Patient taking differently: TAKE ONE TABLET BY MOUTH EVERY MORNING 08/12/14  Yes Lelon Perla, MD  levalbuterol [facility] HFA) 45 MCG/ACT inhaler Inhale 2 puffs into the lungs every 4 (four) hours as needed for wheezing. Patient taking differently: Inhale 2 puffs into the lungs every 4 (four) hours as needed for wheezing or shortness of breath.  09/07/13  Yes Tammy S Parrett, NP  mirtazapine (REMERON) 15 MG tablet Take 7.5 mg by mouth at bedtime. Take 1/2 tablet by mouth at bedtime   Yes Historical Provider, MD  nitroGLYCERIN (NITROSTAT) 0.4 MG SL tablet Place 0.4 mg under the tongue every 5 (  five) minutes as needed for chest pain (may repeat x3). Chest pain 07/10/10  Yes Lelon Perla, MD  nystatin (MYCOSTATIN) 100000 UNIT/ML suspension Take 5 mLs by mouth 4 (four) times daily.  04/04/14  Yes Historical Provider, MD  omeprazole (PRILOSEC) 20 MG capsule Take 20 mg by mouth daily as needed (for acid reflux).    Yes Historical Provider, MD  ondansetron (ZOFRAN) 8 MG tablet TAKE 1/2 TABLET ONCE DAILY AS NEEDED FOR NAUSEA 10/19/14  Yes Historical Provider, MD  OXYGEN Inhale 4.5 L into the lungs continuous.    Yes Historical Provider, MD  potassium chloride SA (K-DUR,KLOR-CON) 20 MEQ tablet Take 2 tablets by mouth every morning   Yes Historical Provider, MD  pravastatin (PRAVACHOL) 80 MG tablet Take 80 mg by mouth at bedtime.   Yes Historical Provider, MD  ranitidine (ZANTAC) 150 MG capsule Take 150 mg by mouth daily as needed for heartburn.    Yes Historical Provider, MD   tamsulosin (FLOMAX) 0.4 MG CAPS capsule TAKE 1 CAPSULE AT BEDTIME FOR INCONTINENCE. 10/19/14  Yes Historical Provider, MD  loperamide (IMODIUM) 2 MG capsule Take 1 capsule (2 mg total) by mouth every 6 (six) hours as needed for diarrhea or loose stools. Patient not taking: Reported on 10/21/2014 04/29/14   Thurnell Lose, MD  OXYGEN Inhale 3 L into the lungs daily.    Historical Provider, MD  pantoprazole (PROTONIX) 40 MG tablet TAKE 1 TABLET BY MOUTH ONCE DAILY 30-60 MINTUES BEFORE FIRST MEAL OF THE DAY. Patient not taking: Reported on 10/21/2014 03/07/14   Tanda Rockers, MD  predniSONE (DELTASONE) 1 MG tablet 2 tablets for 3 days 1 tablet for 3 days then discontinue Patient not taking: Reported on 10/21/2014 04/29/14   Thurnell Lose, MD     Allergies:  Allergies  Allergen Reactions  . Albuterol Other (See Comments)    Jittery/shakiness  . Levaquin [Levofloxacin In D5w] Nausea Only  . Sulfa Antibiotics Nausea Only    Social History:   reports that she quit smoking about 3 years ago. Her smoking use included Cigarettes. She has a 100 pack-year smoking history. She has never used smokeless tobacco. She reports that she does not drink alcohol or use illicit drugs.  Family History: Family History  Problem Relation Age of Onset  . Heart disease Mother 26  . Heart disease Father   . Hyperlipidemia Father   . Hypertension Father   . Diabetes Daughter   . Peripheral vascular disease Daughter      Physical Exam: Filed Vitals:   11/10/2014 1945 11/01/2014 2030 11/08/2014 2100 11/02/2014 2221  BP:  109/61 96/52 86/63   Pulse: 123 112 102 79  Temp:    98.3 F (36.8 C)  TempSrc:    Axillary  Resp: 22 14 15 13   SpO2: 91% 97% 95% 97%   Blood pressure 86/63, pulse 79, temperature 98.3 F (36.8 C), temperature source Axillary, resp. rate 13, SpO2 97 %.  GEN:  Pleasant patient lying in the stretcher in no acute distress; cooperative with exam.  On Bipap.  PSYCH:  alert and oriented x4; does  not appear anxious or depressed; affect is appropriate. HEENT: Mucous membranes pink and anicteric; PERRLA; EOM intact; no cervical lymphadenopathy nor thyromegaly or carotid bruit; no JVD; There were no stridor. Neck is very supple. Breasts:: Not examined CHEST WALL: No tenderness CHEST: decreased BS, on Bipap.   HEART: Regular rate and rhythm.  There are no murmur, rub, or gallops.   BACK:  No kyphosis or scoliosis; no CVA tenderness ABDOMEN: soft and non-tender; no masses, no organomegaly, normal abdominal bowel sounds; no pannus; no intertriginous candida. There is no rebound and no distention. Rectal Exam: Not done EXTREMITIES: No bone or joint deformity; age-appropriate arthropathy of the hands and knees; no edema; no ulcerations.  There is no calf tenderness. Genitalia: not examined PULSES: 2+ and symmetric SKIN: Normal hydration no rash or ulceration CNS: Cranial nerves 2-12 grossly intact no focal lateralizing neurologic deficit.  Speech is fluent; uvula elevated with phonation, facial symmetry and tongue midline. DTR are normal bilaterally, cerebella exam is intact, barbinski is negative and strengths are equaled bilaterally.  No sensory loss.   Labs on Admission:  Basic Metabolic Panel:  Recent Labs Lab 11/04/2014 1903  NA 137  K 3.8  CL 92*  CO2 36*  GLUCOSE 128*  BUN 9  CREATININE 0.62  CALCIUM 8.6*   Liver Function Tests:  Recent Labs Lab 11/19/2014 1903  AST 18  ALT 11*  ALKPHOS 73  BILITOT 0.6  PROT 6.3*  ALBUMIN 3.3*   CBC:  Recent Labs Lab 10/31/2014 1903  WBC 14.2*  NEUTROABS 11.6*  HGB 10.7*  HCT 37.3  MCV 94.0  PLT 303    Radiological Exams on Admission: Dg Chest Portable 1 View  11/08/2014   CLINICAL DATA:  Shortness of breath for 2 days with tachycardia, history of CHF and COPD  EXAM: PORTABLE CHEST - 1 VIEW  COMPARISON:  09/07/2014  FINDINGS: Moderate cardiac enlargement. Calcification of the aorta. Infiltrate in the right mid to lower lung  zone. Left lower lobe not evaluated on single AP view with cardiac enlargement. Left upper lobe is clear.  IMPRESSION: Infiltrate right mid to lower lung zone concerning for pneumonia/pneumonitis.   Electronically Signed   By: Skipper Cliche M.D.   On: 11/20/2014 19:05   Assessment/Plan Present on Admission:  . Acute exacerbation of chronic obstructive pulmonary disease (COPD) . Chronic respiratory failure with hypercapnia . HCAP (healthcare-associated pneumonia) . Chronic combined systolic and diastolic congestive heart failure . Ischemic cardiomyopathy  PLAN:  Will admit her into the ICU for COPD exacerbation, acute on chronic respiratory failure due to HCAP of the right lobes.  Will continue with IV Lucianne Lei and Zosyn given in the ER, and give IV Steroids, along with continuation of her Bipap.  She will be admitted into the ICU.   I spoke with her tonight, and she would like to be full code.  At this time, she was able to converse meaningfully.  She want to rest a little on her Bipap, which I think is reasonable.  She will be admitted into the ICU under [facility] service.  Thank you for allowing me to participate in her care.   Other plans as per orders.  Code Status: FULL Haskel Khan, MD. [facility] Pager [phone] 7pm to 7am.  11/04/2014, 10:25 PM

## 2014-11-11 NOTE — ED Notes (Signed)
Pt reports increasing SOB over the past 2 days, O2 sat in 80s upon first responders arrival. Pt on 4L via [place] at home. Pt presently on nonrebreather. No tx en route.

## 2014-11-11 NOTE — ED Provider Notes (Signed)
CSN: [id number]     Arrival date & time 11/10/2014  1828 History   First MD Initiated Contact with Patient 11/08/2014 1842     Chief Complaint  Patient presents with  . Shortness of Breath     (Consider location/radiation/quality/duration/timing/severity/associated sxs/prior Treatment) HPI   Patient is a 74 year old female is chronically very sick with COPD, on home oxygen, CAD, AAA, CHF. She is presenting today with acute short of breath. She's been getting short of breath for the last 3 days. On arrival patient is tachypnic and tachycardic. She reports that she's been using her oxygen and her inhalers as usual. She denies any fever. She denies any chest pain.  Level 5 caveat: Acute distress.   Past Medical History  Diagnosis Date  . Hypertension   . Hyperlipidemia   . Cerebrovascular disease, unspecified   . Unspecified glaucoma   . Obesity, unspecified   . Coronary artery disease     cath 04/21/14:severe multivessel disease with a left dominant coronary system, chronic occlusion of the LAD, and heavily calcified severe stenosis of the proximal left circumflex.  She underwent rotational atherectomy and stenting of the left circumflex  . Ventricular tachycardia   . SVT (supraventricular tachycardia)   . AAA (abdominal aortic aneurysm)   . COPD (chronic obstructive pulmonary disease) 2013    [place] of breath     WITH ACTIVITY--USES OXYGEN AS NEEDED  2&1/2 L PER NASAL CANNUL  . Complication of anesthesia     PT STATES SHE CAN NOT BE PUT TO SLEEP BECAUSE OF HER LUNG PROBLEMS  . Carotid artery occlusion   . Anemia   . CHF (congestive heart failure)   . Abdominal aneurysm     "hasn't been repaired" (10/20/2013)  . Pneumonia X ~ 2  . On home oxygen therapy     "4L; 24/7" (10/20/2013)  . GERD (gastroesophageal reflux disease)   . Migraines     "before tubal ligation I had them alot; seldom have one anymore" (10/20/2013)  . Arthritis     "knees mainly" (10/20/2013)   . Bladder cancer 2009  . Atrial fibrillation   . Bell palsy 2014   Past Surgical History  Procedure Laterality Date  . Cystoscopy    . Bladder tumor excision      resection of 5 bladder, and fulguration of 1 small bladder tumor  . Cystoscopy      cold cup bladder biopsy of five tumors, and fulguration of one tumor  . Wedge resection Right 1980's    Remote right lung  . Cystoscopy with biopsy  12/24/2011    Procedure: CYSTOSCOPY WITH BIOPSY;  Surgeon: Fredricka Bonine, MD;  Location: [facility];  Service: Urology;  Laterality: N/A;  . Tubal ligation Bilateral 1980's  . Cataract extraction w/ intraocular lens  implant, bilateral Bilateral ~ 2011  . Cardiac catheterization  "several"    "too bad to put stents in"  . Left heart catheterization with coronary angiogram N/A 04/18/2014    Procedure: LEFT HEART CATHETERIZATION WITH CORONARY ANGIOGRAM;  Surgeon: Jolaine Artist, MD;  Location: [facility] CATH LAB;  Service: Cardiovascular;  Laterality: N/A;  . Percutaneous coronary rotoblator intervention (pci-r) N/A 04/21/2014    Procedure: PERCUTANEOUS CORONARY ROTOBLATOR INTERVENTION (PCI-R);  Surgeon: Blane Ohara, MD;  Location: [facility] CATH LAB;  Service: Cardiovascular;  Laterality: N/A;   Family History  Problem Relation Age of Onset  . Heart disease Mother 61  . Heart disease Father   .  Hyperlipidemia Father   . Hypertension Father   . Diabetes Daughter   . Peripheral vascular disease Daughter    Social History  Substance Use Topics  . Smoking status: Former Smoker -- 2.00 packs/day for 50 years    Types: Cigarettes    Quit date: 05/18/2011  . Smokeless tobacco: Never Used  . Alcohol Use: No   OB History    No data available     Review of Systems  Unable to perform ROS: Unstable vital signs      Allergies  Albuterol; Levaquin; and Sulfa antibiotics  Home Medications   Prior to Admission medications   Medication Sig Start Date End Date Taking? Authorizing Provider   acetaminophen-codeine (TYLENOL #3) 300-30 MG per tablet Take 1 tablet by mouth every 4 (four) hours as needed for moderate pain.   Yes Historical Provider, MD  amiodarone (PACERONE) 200 MG tablet Take 200 mg by mouth 2 (two) times daily.  09/20/11  Yes Lelon Perla, MD  aspirin EC 81 MG tablet Take 81 mg by mouth at bedtime.   Yes Historical Provider, MD  budesonide-formoterol (SYMBICORT) 160-4.5 MCG/ACT inhaler Inhale 2 puffs into the lungs 2 (two) times daily. 09/07/14  Yes Tanda Rockers, MD  clonazePAM (KLONOPIN) 0.5 MG tablet Take 0.25-0.5 mg by mouth every 6 (six) hours as needed for anxiety.    Yes Historical Provider, MD  clopidogrel (PLAVIX) 75 MG tablet Take 1 tablet (75 mg total) by mouth at bedtime. 12/26/11  Yes Festus Aloe, MD  diltiazem (CARDIZEM CD) 300 MG 24 hr capsule TAKE 1 CAPSULE IN THE EVENING. 02/07/14  Yes Lelon Perla, MD  furosemide (LASIX) 40 MG tablet Take 1 tablet (40 mg total) by mouth daily. Patient taking differently: Take 40 mg by mouth every morning.  04/17/14  Yes Reyne Dumas, MD  guaiFENesin (MUCINEX) 600 MG 12 hr tablet Take 1 tablet by mouth every 12 hours as needed for cough and congestion   Yes Historical Provider, MD  ipratropium (ATROVENT) 0.02 % nebulizer solution USE 1 VIAL IN NEBULIZER EVERY 6 HOURS. Patient taking differently: USE 1 VIAL IN NEBULIZER EVERY 4 HOURS FOR SHORTNESS OF BREATH/WHEEZING 03/14/14  Yes Tanda Rockers, MD  isosorbide mononitrate (IMDUR) 30 MG 24 hr tablet TAKE ONE TABLET BY MOUTH AT BEDTIME. Patient taking differently: TAKE ONE TABLET BY MOUTH EVERY MORNING 08/12/14  Yes Lelon Perla, MD  levalbuterol [facility] HFA) 45 MCG/ACT inhaler Inhale 2 puffs into the lungs every 4 (four) hours as needed for wheezing. Patient taking differently: Inhale 2 puffs into the lungs every 4 (four) hours as needed for wheezing or shortness of breath.  09/07/13  Yes Tammy S Parrett, NP  mirtazapine (REMERON) 15 MG tablet Take 7.5 mg by  mouth at bedtime. Take 1/2 tablet by mouth at bedtime   Yes Historical Provider, MD  nitroGLYCERIN (NITROSTAT) 0.4 MG SL tablet Place 0.4 mg under the tongue every 5 (five) minutes as needed for chest pain (may repeat x3). Chest pain 07/10/10  Yes Lelon Perla, MD  nystatin (MYCOSTATIN) 100000 UNIT/ML suspension Take 5 mLs by mouth 4 (four) times daily.  04/04/14  Yes Historical Provider, MD  omeprazole (PRILOSEC) 20 MG capsule Take 20 mg by mouth daily as needed (for acid reflux).    Yes Historical Provider, MD  ondansetron (ZOFRAN) 8 MG tablet TAKE 1/2 TABLET ONCE DAILY AS NEEDED FOR NAUSEA 10/19/14  Yes Historical Provider, MD  OXYGEN Inhale 4.5 L into the lungs continuous.  Yes Historical Provider, MD  potassium chloride SA (K-DUR,KLOR-CON) 20 MEQ tablet Take 2 tablets by mouth every morning   Yes Historical Provider, MD  pravastatin (PRAVACHOL) 80 MG tablet Take 80 mg by mouth at bedtime.   Yes Historical Provider, MD  ranitidine (ZANTAC) 150 MG capsule Take 150 mg by mouth daily as needed for heartburn.    Yes Historical Provider, MD  tamsulosin (FLOMAX) 0.4 MG CAPS capsule TAKE 1 CAPSULE AT BEDTIME FOR INCONTINENCE. 10/19/14  Yes Historical Provider, MD  loperamide (IMODIUM) 2 MG capsule Take 1 capsule (2 mg total) by mouth every 6 (six) hours as needed for diarrhea or loose stools. Patient not taking: Reported on 10/21/2014 04/29/14   Thurnell Lose, MD  OXYGEN Inhale 3 L into the lungs daily.    Historical Provider, MD  pantoprazole (PROTONIX) 40 MG tablet TAKE 1 TABLET BY MOUTH ONCE DAILY 30-60 MINTUES BEFORE FIRST MEAL OF THE DAY. Patient not taking: Reported on 10/21/2014 03/07/14   Tanda Rockers, MD  predniSONE (DELTASONE) 1 MG tablet 2 tablets for 3 days 1 tablet for 3 days then discontinue Patient not taking: Reported on 10/21/2014 04/29/14   Thurnell Lose, MD   BP 96/52 mmHg  Pulse 102  Temp(Src) 97.9 F (36.6 C) (Axillary)  Resp 15  SpO2 95% Physical Exam  Constitutional:  She is oriented to person, place, and time. She appears well-developed and well-nourished.  In mild distress  HENT:  Head: Normocephalic and atraumatic.  Crusting of bilateral eyes  Eyes: Conjunctivae are normal. Right eye exhibits no discharge.  Neck: Neck supple.  Cardiovascular: Regular rhythm and normal heart sounds.   No murmur heard. tachycardic  Pulmonary/Chest: She has wheezes.  Minimal exchange of air. tachypnic  Abdominal: Soft. She exhibits no distension. There is no tenderness.  Musculoskeletal: Normal range of motion. She exhibits no edema.  Neurological: She is oriented to person, place, and time. No cranial nerve deficit.  Skin: Skin is warm and dry. No rash noted. She is not diaphoretic.  Nursing note and vitals reviewed.   ED Course  Procedures (including critical care time) Labs Review Labs Reviewed  CBC WITH DIFFERENTIAL/PLATELET - Abnormal; Notable for the following:    WBC 14.2 (*)    Hemoglobin 10.7 (*)    MCHC 28.7 (*)    RDW 15.6 (*)    Neutro Abs 11.6 (*)    Monocytes Absolute 1.2 (*)    All other components within normal limits  COMPREHENSIVE METABOLIC PANEL - Abnormal; Notable for the following:    Chloride 92 (*)    CO2 36 (*)    Glucose, Bld 128 (*)    Calcium 8.6 (*)    Total Protein 6.3 (*)    Albumin 3.3 (*)    ALT 11 (*)    All other components within normal limits  BRAIN NATRIURETIC PEPTIDE - Abnormal; Notable for the following:    B Natriuretic Peptide 549.0 (*)    All other components within normal limits  BLOOD GAS, VENOUS - Abnormal; Notable for the following:    pCO2, Ven 80.9 (*)    pO2, Ven 59.9 (*)    Bicarbonate 31.9 (*)    Acid-Base Excess 10.6 (*)    All other components within normal limits  BLOOD GAS, ARTERIAL - Abnormal; Notable for the following:    pCO2 arterial 64.1 (*)    pO2, Arterial 108 (*)    Bicarbonate 31.9 (*)    Acid-Base Excess 9.3 (*)  All other components within normal limits  PROTIME-INR  I-STAT  TROPOININ, ED  I-STAT CG4 LACTIC ACID, ED  I-STAT CG4 LACTIC ACID, ED    Imaging Review Dg Chest Portable 1 View  11/17/2014   CLINICAL DATA:  Shortness of breath for 2 days with tachycardia, history of CHF and COPD  EXAM: PORTABLE CHEST - 1 VIEW  COMPARISON:  09/07/2014  FINDINGS: Moderate cardiac enlargement. Calcification of the aorta. Infiltrate in the right mid to lower lung zone. Left lower lobe not evaluated on single AP view with cardiac enlargement. Left upper lobe is clear.  IMPRESSION: Infiltrate right mid to lower lung zone concerning for pneumonia/pneumonitis.   Electronically Signed   By: Skipper Cliche M.D.   On: 11/15/2014 19:05   I have personally reviewed and evaluated these images and lab results as part of my medical decision-making.   EKG Interpretation   Date/Time:  Friday November 11 2014 18:33:36 EDT Ventricular Rate:  117 PR Interval:    QRS Duration: 180 QT Interval:  407 QTC Calculation: 568 R Axis:   151 Text Interpretation:  Atrial flutter Right bundle branch block No  significant change since last tracing no acute iscehmia Right bundle  branch block Confirmed by Gerald Leitz ([id number]) on 10/30/2014 7:04:22  PM      MDM   Final diagnoses:  None   patient is 74 year old female presenting in respiratory distress. Patient has been increasing short of breath for the last 3 days. She states that she's had no fever no increasing sputum. However she has had an increasing cough and increasing shortness of breath.  On arrival we called for BiPAP, do nebs. Concern for pneumonia versus COPD exacerbation versus CHF exacerbation.At this time she's not moving a lot of air but no clear rales, suspected COPD exacerbation.  We'll get CBG chest x-ray and UA.  9:54 PM PNA, will treat as HCAP. On bipap, VBG pCO2 improved greatly after one hour.  Patient moving better air. Will admit stepdown vs icu.  CRITICAL CARE Performed by: Gardiner Sleeper   Total  critical care time: 30 min Critical care time was exclusive of separately billable procedures and treating other patients.  Critical care was necessary to treat or prevent imminent or life-threatening deterioration.  Critical care was time spent personally by me on the following activities: development of treatment plan with patient and/or surrogate as well as nursing, discussions with consultants, evaluation of patient's response to treatment, examination of patient, obtaining history from patient or surrogate, ordering and performing treatments and interventions, ordering and review of laboratory studies, ordering and review of radiographic studies, pulse oximetry and re-evaluation of patient's condition.   Courteney Julio Alm, MD 10/29/2014 2155

## 2014-11-11 NOTE — ED Notes (Signed)
Critical value of pCO2 64.1 given to Dr. Thomasene Lot.

## 2014-11-12 DIAGNOSIS — I5042 Chronic combined systolic (congestive) and diastolic (congestive) heart failure: Secondary | ICD-10-CM

## 2014-11-12 DIAGNOSIS — T50905A Adverse effect of unspecified drugs, medicaments and biological substances, initial encounter: Secondary | ICD-10-CM

## 2014-11-12 DIAGNOSIS — R000 Tachycardia, unspecified: Secondary | ICD-10-CM

## 2014-11-12 DIAGNOSIS — I255 Ischemic cardiomyopathy: Secondary | ICD-10-CM

## 2014-11-12 DIAGNOSIS — R739 Hyperglycemia, unspecified: Secondary | ICD-10-CM

## 2014-11-12 DIAGNOSIS — I4892 Unspecified atrial flutter: Secondary | ICD-10-CM | POA: Diagnosis present

## 2014-11-12 DIAGNOSIS — E669 Obesity, unspecified: Secondary | ICD-10-CM | POA: Diagnosis present

## 2014-11-12 DIAGNOSIS — J441 Chronic obstructive pulmonary disease with (acute) exacerbation: Secondary | ICD-10-CM

## 2014-11-12 LAB — CBC
HCT: 34.9 % — ABNORMAL LOW (ref 36.0–46.0)
HEMOGLOBIN: 10.1 g/dL — AB (ref 12.0–15.0)
MCH: 27.3 pg (ref 26.0–34.0)
MCHC: 28.9 g/dL — ABNORMAL LOW (ref 30.0–36.0)
MCV: 94.3 fL (ref 78.0–100.0)
PLATELETS: 253 10*3/uL (ref 150–400)
RBC: 3.7 MIL/uL — AB (ref 3.87–5.11)
RDW: 15.6 % — ABNORMAL HIGH (ref 11.5–15.5)
WBC: 11.2 10*3/uL — ABNORMAL HIGH (ref 4.0–10.5)

## 2014-11-12 LAB — BASIC METABOLIC PANEL
Anion gap: 7 (ref 5–15)
BUN: 9 mg/dL (ref 6–20)
CHLORIDE: 93 mmol/L — AB (ref 101–111)
CO2: 39 mmol/L — AB (ref 22–32)
CREATININE: 0.62 mg/dL (ref 0.44–1.00)
Calcium: 8.4 mg/dL — ABNORMAL LOW (ref 8.9–10.3)
GFR calc non Af Amer: >60 mL/min (ref >60)
GLUCOSE: 139 mg/dL — AB (ref 65–99)
Potassium: 3.7 mmol/L (ref 3.5–5.1)
Sodium: 139 mmol/L (ref 135–145)

## 2014-11-12 LAB — GLUCOSE, CAPILLARY
Glucose-Capillary: 174 mg/dL — ABNORMAL HIGH (ref 65–99)
Glucose-Capillary: 154 mg/dL — ABNORMAL HIGH (ref 65–99)
Glucose-Capillary: 225 mg/dL — ABNORMAL HIGH (ref 65–99)

## 2014-11-12 LAB — TSH: TSH: 2.686 u[IU]/mL (ref 0.350–4.500)

## 2014-11-12 LAB — MRSA PCR SCREENING: MRSA by PCR: NEGATIVE

## 2014-11-12 MED ORDER — ACETAMINOPHEN 325 MG PO TABS
650.0000 mg | ORAL_TABLET | Freq: Four times a day (QID) | ORAL | Status: DC | PRN
  Administered 2014-11-12 – 2014-11-13 (×4): 650 mg via ORAL
  Filled 2014-11-12 (×4): qty 2

## 2014-11-12 MED ORDER — INSULIN ASPART 100 UNIT/ML ~~LOC~~ SOLN
0.0000 [IU] | Freq: Three times a day (TID) | SUBCUTANEOUS | Status: DC
  Administered 2014-11-12 – 2014-11-13 (×3): 3 [IU] via SUBCUTANEOUS
  Administered 2014-11-13: 2 [IU] via SUBCUTANEOUS
  Administered 2014-11-14: 5 [IU] via SUBCUTANEOUS
  Administered 2014-11-14: 3 [IU] via SUBCUTANEOUS

## 2014-11-12 MED ORDER — SODIUM CHLORIDE 0.9 % IV BOLUS (SEPSIS)
250.0000 mL | Freq: Once | INTRAVENOUS | Status: AC
  Administered 2014-11-13: 250 mL via INTRAVENOUS

## 2014-11-12 MED ORDER — PIPERACILLIN-TAZOBACTAM 3.375 G IVPB
INTRAVENOUS | Status: AC
  Filled 2014-11-12: qty 50

## 2014-11-12 MED ORDER — LATANOPROST 0.005 % OP SOLN
1.0000 [drp] | Freq: Every day | OPHTHALMIC | Status: DC
  Administered 2014-11-13 – 2014-11-21 (×10): 1 [drp] via OPHTHALMIC
  Filled 2014-11-12: qty 2.5

## 2014-11-12 MED ORDER — INSULIN ASPART 100 UNIT/ML ~~LOC~~ SOLN
0.0000 [IU] | Freq: Every day | SUBCUTANEOUS | Status: DC
  Administered 2014-11-12: 2 [IU] via SUBCUTANEOUS

## 2014-11-12 MED ORDER — VANCOMYCIN HCL IN DEXTROSE 750-5 MG/150ML-% IV SOLN
750.0000 mg | Freq: Two times a day (BID) | INTRAVENOUS | Status: DC
Start: 2014-11-12 — End: 2014-11-22
  Administered 2014-11-12 – 2014-11-22 (×20): 750 mg via INTRAVENOUS
  Filled 2014-11-12 (×22): qty 150

## 2014-11-12 MED ORDER — ENOXAPARIN SODIUM 40 MG/0.4ML ~~LOC~~ SOLN
40.0000 mg | SUBCUTANEOUS | Status: DC
  Administered 2014-11-12 – 2014-11-16 (×5): 40 mg via SUBCUTANEOUS
  Filled 2014-11-12 (×5): qty 0.4

## 2014-11-12 MED ORDER — INSULIN GLARGINE 100 UNIT/ML ~~LOC~~ SOLN
7.0000 [IU] | Freq: Every day | SUBCUTANEOUS | Status: DC
  Administered 2014-11-12 – 2014-11-14 (×3): 7 [IU] via SUBCUTANEOUS
  Filled 2014-11-12 (×3): qty 0.07

## 2014-11-12 MED ORDER — VANCOMYCIN HCL IN DEXTROSE 1-5 GM/200ML-% IV SOLN
1000.0000 mg | Freq: Once | INTRAVENOUS | Status: AC
  Administered 2014-11-12: 1000 mg via INTRAVENOUS
  Filled 2014-11-12 (×2): qty 200

## 2014-11-12 MED ORDER — LATANOPROST 0.005 % OP SOLN
OPHTHALMIC | Status: AC
  Filled 2014-11-12: qty 2.5

## 2014-11-12 MED ORDER — PIPERACILLIN-TAZOBACTAM 3.375 G IVPB
3.3750 g | Freq: Three times a day (TID) | INTRAVENOUS | Status: DC
Start: 2014-11-12 — End: 2014-11-22
  Administered 2014-11-12 – 2014-11-22 (×31): 3.375 g via INTRAVENOUS
  Filled 2014-11-12 (×34): qty 50

## 2014-11-12 MED ORDER — IPRATROPIUM-ALBUTEROL 0.5-2.5 (3) MG/3ML IN SOLN
3.0000 mL | Freq: Four times a day (QID) | RESPIRATORY_TRACT | Status: DC
  Administered 2014-11-12 – 2014-11-14 (×8): 3 mL via RESPIRATORY_TRACT
  Filled 2014-11-12 (×9): qty 3

## 2014-11-12 MED ORDER — INFLUENZA VAC SPLIT QUAD 0.5 ML IM SUSY
0.5000 mL | PREFILLED_SYRINGE | INTRAMUSCULAR | Status: DC
  Filled 2014-11-12: qty 0.5

## 2014-11-12 NOTE — Progress Notes (Signed)
[facility] PROGRESS NOTE  [name] [name] NKN:[id number] DOB: December 26, 1940 DOA: 11/02/2014 PCP: Sherrie Mustache, MD    Code Status: Full code Family Communication: Family not available; discussed with patient Disposition Plan: Discharge when clinically appropriate   Consultants:  None  Procedures:  None  Antibiotics:  Zosyn 9/17  Vancomycin 9/17  HPI/Subjective: Patient says that she is breathing a little better. She denies chest pain.  Objective: Filed Vitals:   11/12/14 0900  BP: 97/54  Pulse: 102  Temp:   Resp: 25   temperature 98.4. Oxygen saturation 98% on nasal cannula oxygen.  Intake/Output Summary (Last 24 hours) at 11/12/14 0949 Last data filed at 11/12/14 0600  Gross per 24 hour  Intake    290 ml  Output      0 ml  Net    290 ml   Filed Weights   10/31/2014 2321  Weight: 74.8 kg (164 lb 14.5 oz)    Exam:   General:  74 year old woman laying in bed, in no acute distress.  Cardiovascular: S1, S2, with occasional ectopic beat.  Respiratory: Right lung crackles and decreased breath sounds in the bases.  Abdomen: Positive bowel sounds, soft, nontender, nondistended.  Musculoskeletal:/Extremities: No pedal edema.  Neurologic: She is alert and oriented 2. Her speech is clear. She follows commands.   Data Reviewed: Basic Metabolic Panel:  Recent Labs Lab 11/22/2014 1903 11/12/14 0431  NA 137 139  K 3.8 3.7  CL 92* 93*  CO2 36* 39*  GLUCOSE 128* 139*  BUN 9 9  CREATININE 0.62 0.62  CALCIUM 8.6* 8.4*   Liver Function Tests:  Recent Labs Lab 10/28/2014 1903  AST 18  ALT 11*  ALKPHOS 73  BILITOT 0.6  PROT 6.3*  ALBUMIN 3.3*   No results for input(s): LIPASE, AMYLASE in the last 168 hours. No results for input(s): AMMONIA in the last 168 hours. CBC:  Recent Labs Lab 11/05/2014 1903 11/12/14 0431  WBC 14.2* 11.2*  NEUTROABS 11.6*  --   HGB 10.7* 10.1*  HCT 37.3 34.9*  MCV 94.0 94.3  PLT 303 253   Cardiac  Enzymes: No results for input(s): CKTOTAL, CKMB, CKMBINDEX, TROPONINI in the last 168 hours. BNP (last 3 results)  Recent Labs  04/15/14 1423 04/26/14 1358 11/10/2014 1903  BNP 229.9* 294.0* 549.0*    ProBNP (last 3 results)  Recent Labs  12/29/13 1238 04/15/14 1417 10/18/14 1456  PROBNP 195.0* 230.0* 288.0*    CBG: No results for input(s): GLUCAP in the last 168 hours.  Recent Results (from the past 240 hour(s))  MRSA PCR Screening     Status: None   Collection Time: 11/13/2014 11:59 PM  Result Value Ref Range Status   MRSA by PCR NEGATIVE NEGATIVE Final    Comment:        The GeneXpert MRSA Assay (FDA approved for NASAL specimens only), is one component of a comprehensive MRSA colonization surveillance program. It is not intended to diagnose MRSA infection nor to guide or monitor treatment for MRSA infections.      Studies: Dg Chest Portable 1 View  11/18/2014   CLINICAL DATA:  Shortness of breath for 2 days with tachycardia, history of CHF and COPD  EXAM: PORTABLE CHEST - 1 VIEW  COMPARISON:  09/07/2014  FINDINGS: Moderate cardiac enlargement. Calcification of the aorta. Infiltrate in the right mid to lower lung zone. Left lower lobe not evaluated on single AP view with cardiac enlargement. Left upper lobe is clear.  IMPRESSION: Infiltrate right  mid to lower lung zone concerning for pneumonia/pneumonitis.   Electronically Signed   By: Skipper Cliche M.D.   On: 11/18/2014 19:05    Scheduled Meds: . amiodarone  200 mg Oral BID  . aspirin EC  81 mg Oral QHS  . budesonide-formoterol  2 puff Inhalation BID  . clopidogrel  75 mg Oral QHS  . diltiazem  240 mg Oral Daily  . furosemide  40 mg Oral BH-q7a  . ipratropium-albuterol  3 mL Nebulization Q6H  . isosorbide mononitrate  30 mg Oral q morning - 10a  . methylPREDNISolone (SOLU-MEDROL) injection  40 mg Intravenous Q6H  . mirtazapine  7.5 mg Oral QHS  . pantoprazole  40 mg Oral Daily  . piperacillin-tazobactam  (ZOSYN)  IV  3.375 g Intravenous Q8H  . potassium chloride SA  20 mEq Oral Daily  . pravastatin  80 mg Oral QHS  . sodium chloride  3 mL Intravenous Q12H  . vancomycin  1,000 mg Intravenous Once  . vancomycin  750 mg Intravenous Q12H   Continuous Infusions:   Assessment and plan:  Principal Problem:   HCAP (healthcare-associated pneumonia) Active Problems:   Acute exacerbation of chronic obstructive pulmonary disease (COPD)   Acute on chronic respiratory failure with hypercapnia   Chronic combined systolic and diastolic congestive heart failure   Ischemic cardiomyopathy   Tachyarrhythmia   Essential hypertension   Hyperglycemia, drug-induced   Obesity   1. Healthcare acquired pneumonia. On admission, the patient's chest x-ray revealed right mid to lower lung pneumonia/pneumonitis. Her white blood cell count was elevated at 14.2. She was afebrile on admission. She was started on Zosyn and vancomycin. -Continue current management.  Acute COPD exacerbation causing acute on chronic respiratory failure with hypercapnia. Patient is treated chronically with 4-5 L of oxygen 24/7. Patient's ABG on admission revealed pH of 7.35, PCO2 of 64, and PO2 of 108. She was started on BiPAP and her oxygen was titrated accordingly. IV Solu-Medrol and duo nebulizers were started. Symbicort was restarted.  History of tachyarrhythmias. Per review of the patient's chart, she has a history of V. tach and SVT. Her EKG on admission revealed atrial flutter. Her heart rate is modestly elevated. We'll continue amiodarone and diltiazem for treatment. Consider changing albuterol to Xopenex.  Ischemic cardiomyopathy with chronic combined heart failure. Echocardiogram February 2016 revealed an EF of 40-45% and grade 1 diastolic dysfunction. This appears to be compensated; no evidence of pulmonary edema on chest x-ray. She is being continued on Lasix, Plavix, isosorbide mononitrate, and  pravastatin.  Hyperglycemia, likely steroid-induced. Will start sliding scale NovoLog and Lantus.  Normocytic anemia.  Patient's hemoglobin ranges from 10.0-11.0. We'll order a few anemia studies and continue to monitor.   Time spent: 35 minutes    [facility] Pager [phone]. If 7PM-7AM, please contact night-coverage at [email or web address], password [email or web address] 11/12/2014, 9:49 AM  LOS: 1 day

## 2014-11-12 NOTE — Progress Notes (Addendum)
ANTIBIOTIC CONSULT NOTE-Preliminary  Pharmacy Consult for vancomycin/zosyn Indication: pneumonia  Allergies  Allergen Reactions  . Albuterol Other (See Comments)    Jittery/shakiness  . Levaquin [Levofloxacin In D5w] Nausea Only  . Sulfa Antibiotics Nausea Only    Patient Measurements: Height: 5\' 3"  (160 cm) Weight: 164 lb 14.5 oz (74.8 kg) IBW/kg (Calculated) : 52.4   Vital Signs: Temp: 99 F (37.2 C) (09/16 2321) Temp Source: Oral (09/16 2321) BP: 91/39 mmHg (09/17 0100) Pulse Rate: 51 (09/17 0100)  Labs:  Recent Labs  11/09/2014 1903  WBC 14.2*  HGB 10.7*  PLT 303  CREATININE 0.62    Estimated Creatinine Clearance: 60.7 mL/min (by C-G formula based on Cr of 0.62).  No results for input(s): VANCOTROUGH, VANCOPEAK, VANCORANDOM, GENTTROUGH, GENTPEAK, GENTRANDOM, TOBRATROUGH, TOBRAPEAK, TOBRARND, AMIKACINPEAK, AMIKACINTROU, AMIKACIN in the last 72 hours.   Microbiology: No results found for this or any previous visit (from the past 720 hour(s)).  Medical History: Past Medical History  Diagnosis Date  . Hypertension   . Hyperlipidemia   . Cerebrovascular disease, unspecified   . Unspecified glaucoma   . Obesity, unspecified   . Coronary artery disease     cath 04/21/14:severe multivessel disease with a left dominant coronary system, chronic occlusion of the LAD, and heavily calcified severe stenosis of the proximal left circumflex.  She underwent rotational atherectomy and stenting of the left circumflex  . Ventricular tachycardia   . SVT (supraventricular tachycardia)   . AAA (abdominal aortic aneurysm)   . COPD (chronic obstructive pulmonary disease) 2013    [place] of breath     WITH ACTIVITY--USES OXYGEN AS NEEDED  2&1/2 L PER NASAL CANNUL  . Complication of anesthesia     PT STATES SHE CAN NOT BE PUT TO SLEEP BECAUSE OF HER LUNG PROBLEMS  . Carotid artery occlusion   . Anemia   . CHF (congestive heart failure)   . Abdominal aneurysm      "hasn't been repaired" (10/20/2013)  . Pneumonia X ~ 2  . On home oxygen therapy     "4L; 24/7" (10/20/2013)  . GERD (gastroesophageal reflux disease)   . Migraines     "before tubal ligation I had them alot; seldom have one anymore" (10/20/2013)  . Arthritis     "knees mainly" (10/20/2013)  . Bladder cancer 2009  . Atrial fibrillation   . Bell palsy 2014    Medications:  Scheduled:  . albuterol  2.5 mg Nebulization Q6H  . amiodarone  200 mg Oral BID  . aspirin EC  81 mg Oral QHS  . budesonide-formoterol  2 puff Inhalation BID  . clopidogrel  75 mg Oral QHS  . diltiazem  240 mg Oral Daily  . furosemide  40 mg Oral BH-q7a  . isosorbide mononitrate  30 mg Oral q morning - 10a  . methylPREDNISolone (SOLU-MEDROL) injection  40 mg Intravenous Q6H  . mirtazapine  7.5 mg Oral QHS  . pantoprazole  40 mg Oral Daily  . piperacillin-tazobactam (ZOSYN)  IV  3.375 g Intravenous Q8H  . potassium chloride SA  20 mEq Oral Daily  . pravastatin  80 mg Oral QHS  . sodium chloride  3 mL Intravenous Q12H  . vancomycin  1,000 mg Intravenous Once    Assessment: 74 yo female with hx of severe COPD admitted for HCAP and COPD exacerbation.   Goal of Therapy:  Vancomycin trough level 15-20 mcg/ml  Plan:  Preliminary review of pertinent patient information completed.  Protocol will be initiated with a one-time dose(s) of vancomycin 1000 mg IV and Zosyn 3.375 grams IV x 1.  Forestine Na clinical pharmacist will complete review during morning rounds to assess patient and finalize treatment regimen.  [place], Tiffany Scarlett, RPH 11/12/2014,1:54 AM  Addum:  Will continue zosyn 3.375 gm IV q8 hours and vancomycin 750 mg IV q12 hours F/u renal function, cultures and clinical course Lora Seay,PharmD

## 2014-11-13 LAB — CBC
HCT: 31.2 % — ABNORMAL LOW (ref 36.0–46.0)
HEMOGLOBIN: 9.1 g/dL — AB (ref 12.0–15.0)
MCH: 27.2 pg (ref 26.0–34.0)
MCHC: 29.2 g/dL — AB (ref 30.0–36.0)
MCV: 93.4 fL (ref 78.0–100.0)
Platelets: 229 10*3/uL (ref 150–400)
RBC: 3.34 MIL/uL — ABNORMAL LOW (ref 3.87–5.11)
RDW: 15.4 % (ref 11.5–15.5)
WBC: 8.9 10*3/uL (ref 4.0–10.5)

## 2014-11-13 LAB — GLUCOSE, CAPILLARY
GLUCOSE-CAPILLARY: 156 mg/dL — AB (ref 65–99)
GLUCOSE-CAPILLARY: 111 mg/dL — AB (ref 65–99)
Glucose-Capillary: 133 mg/dL — ABNORMAL HIGH (ref 65–99)
Glucose-Capillary: 170 mg/dL — ABNORMAL HIGH (ref 65–99)

## 2014-11-13 LAB — IRON AND TIBC
IRON: 10 ug/dL — AB (ref 28–170)
Saturation Ratios: 3 % — ABNORMAL LOW (ref 10.4–31.8)
TIBC: 357 ug/dL (ref 250–450)
UIBC: 347 ug/dL

## 2014-11-13 LAB — VITAMIN B12: Vitamin B-12: 178 pg/mL — ABNORMAL LOW (ref 180–914)

## 2014-11-13 LAB — BASIC METABOLIC PANEL
ANION GAP: 8 (ref 5–15)
BUN: 9 mg/dL (ref 6–20)
CALCIUM: 8.4 mg/dL — AB (ref 8.9–10.3)
CO2: 40 mmol/L — ABNORMAL HIGH (ref 22–32)
Chloride: 93 mmol/L — ABNORMAL LOW (ref 101–111)
Creatinine, Ser: 0.49 mg/dL (ref 0.44–1.00)
GLUCOSE: 147 mg/dL — AB (ref 65–99)
Potassium: 3.5 mmol/L (ref 3.5–5.1)
Sodium: 141 mmol/L (ref 135–145)

## 2014-11-13 LAB — FERRITIN: FERRITIN: 10 ng/mL — AB (ref 11–307)

## 2014-11-13 MED ORDER — LATANOPROST 0.005 % OP SOLN
OPHTHALMIC | Status: AC
  Filled 2014-11-13: qty 2.5

## 2014-11-13 MED ORDER — POTASSIUM CHLORIDE IN NACL 20-0.9 MEQ/L-% IV SOLN
INTRAVENOUS | Status: DC
  Administered 2014-11-13 – 2014-11-15 (×3): via INTRAVENOUS

## 2014-11-13 MED ORDER — POTASSIUM CHLORIDE CRYS ER 20 MEQ PO TBCR
20.0000 meq | EXTENDED_RELEASE_TABLET | Freq: Two times a day (BID) | ORAL | Status: DC
  Administered 2014-11-13 (×2): 20 meq via ORAL
  Filled 2014-11-13 (×2): qty 1

## 2014-11-13 MED ORDER — CETYLPYRIDINIUM CHLORIDE 0.05 % MT LIQD
7.0000 mL | Freq: Two times a day (BID) | OROMUCOSAL | Status: DC
  Administered 2014-11-13 – 2014-11-21 (×18): 7 mL via OROMUCOSAL

## 2014-11-13 MED ORDER — POTASSIUM CHLORIDE CRYS ER 20 MEQ PO TBCR: 20.0000 meq | EXTENDED_RELEASE_TABLET | Freq: Every day | ORAL | Status: DC

## 2014-11-13 NOTE — Progress Notes (Signed)
Utilization review Completed Kimberly Welborn RN BSN   

## 2014-11-13 NOTE — Progress Notes (Signed)
[facility] PROGRESS NOTE  [name] [name] WLN:[id number] DOB: 1940-11-16 DOA: 11/18/2014 PCP: Sherrie Mustache, MD    Code Status: Full code Family Communication: Family not available; discussed with patient Disposition Plan: Discharge when clinically appropriate   Consultants:  None  Procedures:  None  Antibiotics:  Zosyn 9/17  Vancomycin 9/17  HPI/Subjective: Patient says that she is breathing a little better. Her appetite has improved. She ate nearly 100% of her breakfast.  Objective: Filed Vitals:   11/13/14 0804  BP:   Pulse:   Temp: 98.2 F (36.8 C)  Resp:    temperature 98.2. Pulse 101. Blood pressure 101/62. Oxygen saturation 98% on nasal cannula oxygen. Respiratory rate 18.  Intake/Output Summary (Last 24 hours) at 11/13/14 0832 Last data filed at 11/13/14 0334  Gross per 24 hour  Intake    780 ml  Output   1250 ml  Net   -470 ml   Filed Weights   11/12/2014 2321  Weight: 74.8 kg (164 lb 14.5 oz)    Exam:   General:  74 year old woman laying in bed, in no acute distress.  Cardiovascular: S1, S2, with occasional ectopic beat.  Respiratory: Breathing is nonlabored. Right lung crackles and decreased breath sounds in the bases.  Abdomen: Positive bowel sounds, soft, nontender, nondistended.  Musculoskeletal:/Extremities: No pedal edema.  Neurologic: She is alert and oriented 2. Her speech is clear. She follows commands.   Data Reviewed: Basic Metabolic Panel:  Recent Labs Lab 11/18/2014 1903 11/12/14 0431 11/13/14 0442  NA 137 139 141  K 3.8 3.7 3.5  CL 92* 93* 93*  CO2 36* 39* 40*  GLUCOSE 128* 139* 147*  BUN 9 9 9   CREATININE 0.62 0.62 0.49  CALCIUM 8.6* 8.4* 8.4*   Liver Function Tests:  Recent Labs Lab 11/16/2014 1903  AST 18  ALT 11*  ALKPHOS 73  BILITOT 0.6  PROT 6.3*  ALBUMIN 3.3*   No results for input(s): LIPASE, AMYLASE in the last 168 hours. No results for input(s): AMMONIA in the last 168  hours. CBC:  Recent Labs Lab 11/23/2014 1903 11/12/14 0431 11/13/14 0442  WBC 14.2* 11.2* 8.9  NEUTROABS 11.6*  --   --   HGB 10.7* 10.1* 9.1*  HCT 37.3 34.9* 31.2*  MCV 94.0 94.3 93.4  PLT 303 253 229   Cardiac Enzymes: No results for input(s): CKTOTAL, CKMB, CKMBINDEX, TROPONINI in the last 168 hours. BNP (last 3 results)  Recent Labs  04/15/14 1423 04/26/14 1358 11/21/2014 1903  BNP 229.9* 294.0* 549.0*    ProBNP (last 3 results)  Recent Labs  12/29/13 1238 04/15/14 1417 10/18/14 1456  PROBNP 195.0* 230.0* 288.0*    CBG:  Recent Labs Lab 11/12/14 1121 11/12/14 1616 11/12/14 2111 11/13/14 0731  GLUCAP 174* 154* 225* 133*    Recent Results (from the past 240 hour(s))  MRSA PCR Screening     Status: None   Collection Time: 11/22/2014 11:59 PM  Result Value Ref Range Status   MRSA by PCR NEGATIVE NEGATIVE Final    Comment:        The GeneXpert MRSA Assay (FDA approved for NASAL specimens only), is one component of a comprehensive MRSA colonization surveillance program. It is not intended to diagnose MRSA infection nor to guide or monitor treatment for MRSA infections.      Studies: Dg Chest Portable 1 View  11/16/2014   CLINICAL DATA:  Shortness of breath for 2 days with tachycardia, history of CHF and COPD  EXAM: PORTABLE  CHEST - 1 VIEW  COMPARISON:  09/07/2014  FINDINGS: Moderate cardiac enlargement. Calcification of the aorta. Infiltrate in the right mid to lower lung zone. Left lower lobe not evaluated on single AP view with cardiac enlargement. Left upper lobe is clear.  IMPRESSION: Infiltrate right mid to lower lung zone concerning for pneumonia/pneumonitis.   Electronically Signed   By: Skipper Cliche M.D.   On: 11/20/2014 19:05    Scheduled Meds: . amiodarone  200 mg Oral BID  . antiseptic oral rinse  7 mL Mouth Rinse BID  . aspirin EC  81 mg Oral QHS  . budesonide-formoterol  2 puff Inhalation BID  . clopidogrel  75 mg Oral QHS  .  diltiazem  240 mg Oral Daily  . enoxaparin (LOVENOX) injection  40 mg Subcutaneous Q24H  . furosemide  40 mg Oral BH-q7a  . insulin aspart  0-15 Units Subcutaneous TID WC  . insulin aspart  0-5 Units Subcutaneous QHS  . insulin glargine  7 Units Subcutaneous QHS  . ipratropium-albuterol  3 mL Nebulization Q6H  . isosorbide mononitrate  30 mg Oral q morning - 10a  . latanoprost  1 drop Both Eyes QHS  . methylPREDNISolone (SOLU-MEDROL) injection  40 mg Intravenous Q6H  . mirtazapine  7.5 mg Oral QHS  . pantoprazole  40 mg Oral Daily  . piperacillin-tazobactam (ZOSYN)  IV  3.375 g Intravenous Q8H  . potassium chloride SA  20 mEq Oral Daily  . pravastatin  80 mg Oral QHS  . sodium chloride  3 mL Intravenous Q12H  . vancomycin  750 mg Intravenous Q12H   Continuous Infusions:   Assessment and plan:  Principal Problem:   HCAP (healthcare-associated pneumonia) Active Problems:   Acute exacerbation of chronic obstructive pulmonary disease (COPD)   Acute on chronic respiratory failure with hypercapnia   Chronic combined systolic and diastolic congestive heart failure   Ischemic cardiomyopathy   Tachyarrhythmia   Essential hypertension   Hyperglycemia, drug-induced   Obesity   1. Healthcare acquired pneumonia. On admission, the patient's chest x-ray revealed right mid to lower lung pneumonia/pneumonitis. Her white blood cell count was elevated at 14.2. She was afebrile on admission. She was started on Zosyn and vancomycin. Her white blood cell count has normalized. She remains afebrile. She is improving clinically, but not ready for discharge. We'll transfer her to telemetry. -Continue current management.  Acute COPD exacerbation causing acute on chronic respiratory failure with hypercapnia. Patient is treated chronically with 4-5 L of oxygen 24/7. Patient's ABG on admission revealed pH of 7.35, PCO2 of 64, and PO2 of 108. She was started on BiPAP and her oxygen was titrated  accordingly. IV Solu-Medrol and duo nebulizers were started. Symbicort was restarted. -She has improved clinically.  History of tachyarrhythmias. Per review of the patient's chart, she has a history of V. tach and SVT. Her EKG on admission revealed atrial flutter. Her heart rate is modestly elevated. We'll continue amiodarone and diltiazem for treatment. Consider changing albuterol to Xopenex. -Follow-up EKG pending.  Ischemic cardiomyopathy with chronic combined heart failure. Echocardiogram February 2016 revealed an EF of 40-45% and grade 1 diastolic dysfunction. This appears to be compensated; no evidence of pulmonary edema on chest x-ray. She is being continued on Lasix, Plavix, isosorbide mononitrate, and pravastatin.  Hyperglycemia, likely steroid-induced. Sliding scale NovoLog and Lantus were started. Her CBGs are reasonable.  Normocytic anemia.  Patient's hemoglobin ranges from 10.0-11.0. Total iron, ferritin, and vitamin B12 ordered and are pending. Her TSH was  within normal limits.    -We'll transfer the patient out of the ICU/stepdown. Will order physical therapy evaluation.   Time spent: 35 minutes    [facility] Pager [phone]. If 7PM-7AM, please contact night-coverage at [email or web address], password [facility] 11/13/2014, 8:32 AM  LOS: 2 days

## 2014-11-13 NOTE — Progress Notes (Signed)
Report called to L.Bullins RN. Pt to be transferred to room 334 via bed with nursing staff. Spouse accompanied at bedside.

## 2014-11-13 NOTE — Progress Notes (Signed)
Pt states that her IV is causing discomfort. I offered to access another IV and patient refused.

## 2014-11-14 ENCOUNTER — Inpatient Hospital Stay (HOSPITAL_COMMUNITY): Payer: Medicare Other

## 2014-11-14 ENCOUNTER — Encounter (HOSPITAL_COMMUNITY): Payer: Medicare Other

## 2014-11-14 DIAGNOSIS — E538 Deficiency of other specified B group vitamins: Secondary | ICD-10-CM | POA: Diagnosis present

## 2014-11-14 DIAGNOSIS — D509 Iron deficiency anemia, unspecified: Secondary | ICD-10-CM | POA: Diagnosis present

## 2014-11-14 DIAGNOSIS — I959 Hypotension, unspecified: Secondary | ICD-10-CM | POA: Diagnosis not present

## 2014-11-14 LAB — BLOOD GAS, ARTERIAL
Acid-Base Excess: 5.8 mmol/L — ABNORMAL HIGH (ref 0.0–2.0)
Bicarbonate: 28.8 mEq/L — ABNORMAL HIGH (ref 20.0–24.0)
DRAWN BY: 25788
FIO2: 100
O2 SAT: 98.7 %
PCO2 ART: 59.6 mmHg — AB (ref 35.0–45.0)
PEEP: 5 cmH2O
RATE: 16 resp/min
VT: 420 mL
pH, Arterial: 7.34 — ABNORMAL LOW (ref 7.350–7.450)
pO2, Arterial: 273 mmHg — ABNORMAL HIGH (ref 80.0–100.0)

## 2014-11-14 LAB — CBC
HEMATOCRIT: 40.3 % (ref 36.0–46.0)
Hemoglobin: 11.3 g/dL — ABNORMAL LOW (ref 12.0–15.0)
MCH: 27 pg (ref 26.0–34.0)
MCHC: 28 g/dL — ABNORMAL LOW (ref 30.0–36.0)
MCV: 96.4 fL (ref 78.0–100.0)
Platelets: 454 10*3/uL — ABNORMAL HIGH (ref 150–400)
RBC: 4.18 MIL/uL (ref 3.87–5.11)
RDW: 15.9 % — AB (ref 11.5–15.5)
WBC: 22.4 10*3/uL — AB (ref 4.0–10.5)

## 2014-11-14 LAB — GLUCOSE, CAPILLARY
GLUCOSE-CAPILLARY: 166 mg/dL — AB (ref 65–99)
GLUCOSE-CAPILLARY: 194 mg/dL — AB (ref 65–99)
GLUCOSE-CAPILLARY: 79 mg/dL (ref 65–99)
Glucose-Capillary: 237 mg/dL — ABNORMAL HIGH (ref 65–99)
Glucose-Capillary: 100 mg/dL — ABNORMAL HIGH (ref 65–99)

## 2014-11-14 LAB — BASIC METABOLIC PANEL
ANION GAP: 7 (ref 5–15)
BUN: 14 mg/dL (ref 6–20)
CO2: 38 mmol/L — ABNORMAL HIGH (ref 22–32)
Calcium: 8.6 mg/dL — ABNORMAL LOW (ref 8.9–10.3)
Chloride: 95 mmol/L — ABNORMAL LOW (ref 101–111)
Creatinine, Ser: 0.65 mg/dL (ref 0.44–1.00)
Glucose, Bld: 235 mg/dL — ABNORMAL HIGH (ref 65–99)
POTASSIUM: 5.1 mmol/L (ref 3.5–5.1)
SODIUM: 140 mmol/L (ref 135–145)

## 2014-11-14 LAB — TROPONIN I
TROPONIN I: 0.05 ng/mL — AB (ref <0.031)
TROPONIN I: 0.07 ng/mL — AB (ref <0.031)
Troponin I: <0.03 ng/mL (ref <0.031)

## 2014-11-14 MED ORDER — PANTOPRAZOLE SODIUM 40 MG IV SOLR
40.0000 mg | Freq: Every day | INTRAVENOUS | Status: DC
  Administered 2014-11-14 – 2014-11-22 (×9): 40 mg via INTRAVENOUS
  Filled 2014-11-14 (×9): qty 40

## 2014-11-14 MED ORDER — DILTIAZEM HCL 100 MG IV SOLR: 5.0000 mg/h | INTRAVENOUS | Status: DC

## 2014-11-14 MED ORDER — PHENYLEPHRINE HCL 10 MG/ML IJ SOLN
0.0000 ug/min | INTRAVENOUS | Status: DC
  Administered 2014-11-14: 20 ug/min via INTRAVENOUS
  Filled 2014-11-14: qty 1

## 2014-11-14 MED ORDER — MIDAZOLAM HCL 2 MG/2ML IJ SOLN
1.0000 mg | INTRAMUSCULAR | Status: DC | PRN
  Administered 2014-11-14 – 2014-11-21 (×10): 1 mg via INTRAVENOUS
  Filled 2014-11-14 (×8): qty 2

## 2014-11-14 MED ORDER — LEVALBUTEROL HCL 0.63 MG/3ML IN NEBU
0.6300 mg | INHALATION_SOLUTION | Freq: Four times a day (QID) | RESPIRATORY_TRACT | Status: DC
  Administered 2014-11-14 – 2014-11-22 (×31): 0.63 mg via RESPIRATORY_TRACT
  Filled 2014-11-14 (×32): qty 3

## 2014-11-14 MED ORDER — DILTIAZEM HCL 25 MG/5ML IV SOLN
10.0000 mg | INTRAVENOUS | Status: DC
  Administered 2014-11-14: 10 mg via INTRAVENOUS
  Filled 2014-11-14: qty 5

## 2014-11-14 MED ORDER — METHYLPREDNISOLONE SODIUM SUCC 125 MG IJ SOLR
80.0000 mg | Freq: Four times a day (QID) | INTRAMUSCULAR | Status: AC
  Administered 2014-11-14 – 2014-11-16 (×11): 80 mg via INTRAVENOUS
  Filled 2014-11-14 (×11): qty 2

## 2014-11-14 MED ORDER — DILTIAZEM LOAD VIA INFUSION
10.0000 mg | Freq: Once | INTRAVENOUS | Status: DC
  Filled 2014-11-14: qty 10

## 2014-11-14 MED ORDER — FENTANYL CITRATE (PF) 100 MCG/2ML IJ SOLN
50.0000 ug | INTRAMUSCULAR | Status: DC | PRN
  Administered 2014-11-14: 50 ug via INTRAVENOUS
  Filled 2014-11-14 (×2): qty 2

## 2014-11-14 MED ORDER — SODIUM CHLORIDE 0.9 % IJ SOLN
10.0000 mL | INTRAMUSCULAR | Status: DC | PRN
Start: 2014-11-14 — End: 2014-11-22

## 2014-11-14 MED ORDER — ANTISEPTIC ORAL RINSE SOLUTION (CORINZ)
7.0000 mL | Freq: Four times a day (QID) | OROMUCOSAL | Status: DC
  Administered 2014-11-14 – 2014-11-22 (×33): 7 mL via OROMUCOSAL

## 2014-11-14 MED ORDER — MIDAZOLAM HCL 2 MG/2ML IJ SOLN
1.0000 mg | INTRAMUSCULAR | Status: DC | PRN
  Administered 2014-11-14: 1 mg via INTRAVENOUS
  Filled 2014-11-14: qty 2

## 2014-11-14 MED ORDER — CYANOCOBALAMIN 1000 MCG/ML IJ SOLN
1000.0000 ug | Freq: Every day | INTRAMUSCULAR | Status: AC
  Administered 2014-11-14 – 2014-11-16 (×3): 1000 ug via INTRAMUSCULAR
  Filled 2014-11-14 (×3): qty 1

## 2014-11-14 MED ORDER — ASPIRIN 300 MG RE SUPP
300.0000 mg | Freq: Every day | RECTAL | Status: DC
  Administered 2014-11-14 – 2014-11-18 (×5): 300 mg via RECTAL
  Filled 2014-11-14 (×8): qty 1

## 2014-11-14 MED ORDER — AMIODARONE HCL IN DEXTROSE 360-4.14 MG/200ML-% IV SOLN
30.0000 mg/h | INTRAVENOUS | Status: DC
  Administered 2014-11-14 – 2014-11-21 (×14): 30 mg/h via INTRAVENOUS
  Filled 2014-11-14 (×17): qty 200

## 2014-11-14 MED ORDER — CHLORHEXIDINE GLUCONATE 0.12% ORAL RINSE (MEDLINE KIT)
15.0000 mL | Freq: Two times a day (BID) | OROMUCOSAL | Status: DC
  Administered 2014-11-14 – 2014-11-22 (×17): 15 mL via OROMUCOSAL

## 2014-11-14 MED ORDER — FENTANYL CITRATE (PF) 100 MCG/2ML IJ SOLN
50.0000 ug | INTRAMUSCULAR | Status: DC | PRN
  Administered 2014-11-14 – 2014-11-22 (×12): 50 ug via INTRAVENOUS
  Filled 2014-11-14 (×10): qty 2

## 2014-11-14 MED ORDER — BUDESONIDE 0.25 MG/2ML IN SUSP
0.2500 mg | Freq: Two times a day (BID) | RESPIRATORY_TRACT | Status: DC
  Administered 2014-11-14 – 2014-11-22 (×16): 0.25 mg via RESPIRATORY_TRACT
  Filled 2014-11-14 (×19): qty 2

## 2014-11-14 MED ORDER — PHENYLEPHRINE HCL 10 MG/ML IJ SOLN
0.0000 ug/min | INTRAVENOUS | Status: DC
  Administered 2014-11-14: 90 ug/min via INTRAVENOUS
  Administered 2014-11-14: 50 ug/min via INTRAVENOUS
  Filled 2014-11-14 (×3): qty 4

## 2014-11-14 MED ORDER — SODIUM CHLORIDE 0.9 % IJ SOLN
10.0000 mL | Freq: Two times a day (BID) | INTRAMUSCULAR | Status: DC
  Administered 2014-11-14 – 2014-11-17 (×8): 10 mL
  Administered 2014-11-18 – 2014-11-19 (×2): 40 mL
  Administered 2014-11-19: 10 mL
  Administered 2014-11-20: 40 mL
  Administered 2014-11-20 – 2014-11-21 (×3): 10 mL

## 2014-11-14 MED FILL — Medication: Qty: 1 | Status: AC

## 2014-11-14 NOTE — Progress Notes (Signed)
[facility] PROGRESS NOTE  [name] [name] WNU:[id number] DOB: 07/28/1940 DOA: 11/18/2014 PCP: Sherrie Mustache, MD    Code Status: Full code Family Communication: Discussed with husband Disposition Plan: Discharge when clinically appropriate   Consultants:  Pulmonologist, Dr. Luan Pulling  Procedures:  None  Antibiotics:  Zosyn 9/17>>  Vancomycin 9/17>>  HPI/Subjective: Patient complained of shortness of breath earlier this morning. Nursing reported an O2 sat of 86% on 4 L, heart rate of 146, respiratory rate of 38. She then became obtunded and difficult to arouse. Patient was given when necessary dose of clonazepam approximately one hour ago. No reported opiate given per nursing.  Objective: Filed Vitals:   11/14/14 0756  BP: 126/93  Pulse: 146  Temp: 97.7 F (36.5 C)  Resp: 38   oxygen saturation 100% on nonrebreather.  Intake/Output Summary (Last 24 hours) at 11/14/14 0836 Last data filed at 11/14/14 0646  Gross per 24 hour  Intake   1196 ml  Output      0 ml  Net   1196 ml   Filed Weights   11/13/2014 2321  Weight: 74.8 kg (164 lb 14.5 oz)    Exam:   General:  74 year old woman laying in bed; clearly different from yesterday. She is obtunded and her skin looks dusky.  Cardiovascular: Irregular, irregular, with tachycardia.  Respiratory: Breathing is nonlabored. Breathing is mildly labored. Diffusely decreased breath sounds globally.  Abdomen: Positive bowel sounds, soft, nontender, nondistended.  Musculoskeletal:/Extremities: No pedal edema.  Neurologic: She is obtunded, but responds to provocation.   Data Reviewed: Basic Metabolic Panel:  Recent Labs Lab 10/29/2014 1903 11/12/14 0431 11/13/14 0442  NA 137 139 141  K 3.8 3.7 3.5  CL 92* 93* 93*  CO2 36* 39* 40*  GLUCOSE 128* 139* 147*  BUN 9 9 9   CREATININE 0.62 0.62 0.49  CALCIUM 8.6* 8.4* 8.4*   Liver Function Tests:  Recent Labs Lab 11/13/2014 1903  AST 18  ALT 11*  ALKPHOS  73  BILITOT 0.6  PROT 6.3*  ALBUMIN 3.3*   No results for input(s): LIPASE, AMYLASE in the last 168 hours. No results for input(s): AMMONIA in the last 168 hours. CBC:  Recent Labs Lab 11/14/2014 1903 11/12/14 0431 11/13/14 0442  WBC 14.2* 11.2* 8.9  NEUTROABS 11.6*  --   --   HGB 10.7* 10.1* 9.1*  HCT 37.3 34.9* 31.2*  MCV 94.0 94.3 93.4  PLT 303 253 229   Cardiac Enzymes: No results for input(s): CKTOTAL, CKMB, CKMBINDEX, TROPONINI in the last 168 hours. BNP (last 3 results)  Recent Labs  04/15/14 1423 04/26/14 1358 10/27/2014 1903  BNP 229.9* 294.0* 549.0*    ProBNP (last 3 results)  Recent Labs  12/29/13 1238 04/15/14 1417 10/18/14 1456  PROBNP 195.0* 230.0* 288.0*    CBG:  Recent Labs Lab 11/13/14 0731 11/13/14 1200 11/13/14 1707 11/13/14 2033 11/14/14 0752  GLUCAP 133* 156* 111* 170* 166*    Recent Results (from the past 240 hour(s))  MRSA PCR Screening     Status: None   Collection Time: 11/19/2014 11:59 PM  Result Value Ref Range Status   MRSA by PCR NEGATIVE NEGATIVE Final    Comment:        The GeneXpert MRSA Assay (FDA approved for NASAL specimens only), is one component of a comprehensive MRSA colonization surveillance program. It is not intended to diagnose MRSA infection nor to guide or monitor treatment for MRSA infections.      Studies: No results found.  Scheduled Meds: . amiodarone  200 mg Oral BID  . antiseptic oral rinse  7 mL Mouth Rinse BID  . aspirin EC  81 mg Oral QHS  . budesonide-formoterol  2 puff Inhalation BID  . clopidogrel  75 mg Oral QHS  . diltiazem  240 mg Oral Daily  . diltiazem  10 mg Intravenous STAT  . enoxaparin (LOVENOX) injection  40 mg Subcutaneous Q24H  . furosemide  40 mg Oral BH-q7a  . insulin aspart  0-15 Units Subcutaneous TID WC  . insulin aspart  0-5 Units Subcutaneous QHS  . insulin glargine  7 Units Subcutaneous QHS  . ipratropium-albuterol  3 mL Nebulization Q6H  . isosorbide  mononitrate  30 mg Oral q morning - 10a  . latanoprost  1 drop Both Eyes QHS  . methylPREDNISolone (SOLU-MEDROL) injection  40 mg Intravenous Q6H  . mirtazapine  7.5 mg Oral QHS  . pantoprazole  40 mg Oral Daily  . piperacillin-tazobactam (ZOSYN)  IV  3.375 g Intravenous Q8H  . potassium chloride SA  20 mEq Oral BID  . pravastatin  80 mg Oral QHS  . sodium chloride  3 mL Intravenous Q12H  . vancomycin  750 mg Intravenous Q12H   Continuous Infusions: . 0.9 % NaCl with KCl 20 mEq / L 60 mL/hr at 11/13/14 0857    Assessment and plan:  Principal Problem:   HCAP (healthcare-associated pneumonia) Active Problems:   Acute exacerbation of chronic obstructive pulmonary disease (COPD)   Acute on chronic respiratory failure with hypercapnia   Chronic combined systolic and diastolic congestive heart failure   Ischemic cardiomyopathy   Tachyarrhythmia   Essential hypertension   Hyperglycemia, drug-induced   Obesity   Hypotension    1. Acute encephalopathy with worsening pulmonary status-acute on chronic respiratory failure now requiring intubation for airway protection. Nursing called this morning when the patient complained of shortness of breath. Her heart rate was in the 140s and her oxygen saturation was in the 80s on 4 L of nasal cannula oxygen. She subsequently became obtunded with obvious air hunger and global decreased breath sounds.  -Patient was placed on a 100% nonrebreather. Pulmonologist, Dr. Luan Pulling was consulted for mechanical intubation and ventilation. Patient was intubated regardless of her findings of ABG which were difficult to obtain per RT because of patient's low blood pressure. Patient is being moved to the ICU. Ventilator management and settings per Dr. Luan Pulling. -Patient was given 10 mg of Cardizem for tachyarrhythmia which may have decreased her systolic blood pressure to the 60s to 70s when it had been in the 120s prior to the diltiazem. -We'll start Neo-Synephrine  drip due to hypotension. -We'll continue nebulizers, IV fluids, steroids, and supportive treatment. -Stat chest x-ray, ABG, EKG were ordered; results pending. CBC, BMET, troponin I ordered and are pending. -Discussed the patient's clinical findings with her husband. He recommended a full code but stated if the patient "did not have a chance" then he did not one her on the ventilator. Therefore, intubation was established.   Healthcare acquired pneumonia. On admission, the patient's chest x-ray revealed right mid to lower lung pneumonia/pneumonitis. Her white blood cell count was elevated at 14.2. She was afebrile on admission. She was started on Zosyn and vancomycin. Her white blood cell count has normalized.   Acute COPD exacerbation causing acute on chronic respiratory failure with hypercapnia. Patient is treated chronically with 4-5 L of oxygen 24/7. Patient's ABG on admission revealed pH of 7.35, PCO2 of 64, and PO2  of 108. She was started on BiPAP and her oxygen was titrated accordingly. IV Solu-Medrol and duo nebulizers were started. Symbicort was restarted. -Management as above in #1. Will increase Solu-Medrol to 80 mg every 6 hours. -Because of the tachyarrhythmias, will discontinue DuoNeb and start Xopenex every 4 hours.  History of tachyarrhythmias. Per review of the patient's chart, she has a history of V. tach and SVT. Her EKG on admission revealed atrial flutter. We'll was continued amiodarone and diltiazem for treatment.  -She is tachycardic; irregular rhythm on exam; EKG pending. -She was given 10 mg of IV diltiazem for heart rate in the 140s. -Now that she is intubated, will hold on oral diltiazem and start diltiazem drip if her blood pressure improves on the pressor. Also consider amiodarone drip, but will ask cardiology to assist if amiodarone drip is needed.  Ischemic cardiomyopathy with chronic combined heart failure. Echocardiogram February 2016 revealed an EF of 40-45%  and grade 1 diastolic dysfunction. This appears to be compensated; no evidence of pulmonary edema on chest x-ray. She was continued on Lasix, Plavix, isosorbide mononitrate, and pravastatin; these will be held secondary to intubation. -Could add nitroglycerin ointment if her blood pressure can tolerate it.  Hyperglycemia, likely steroid-induced. Sliding scale NovoLog and Lantus were started. Her CBGs are reasonable.  Normocytic anemia; iron deficiency; vitamin B12 deficiency.  Patient's hemoglobin ranges from 10.0-11.0.  Iron studies revealed a low ferritin of 10, low iron of 10, TIBC of 357, and low vitamin B12 of 178. Total iron, ferritin, and vitamin B12 ordered and are pending. Her TSH was within normal limits. -We'll start vitamin B12 supplementation. We'll hold off on iron infusion and packed red blood cells as her hemoglobin is stable.       Time spent: 50 minutes of critical care time.    [facility] Pager [phone]. If 7PM-7AM, please contact night-coverage at [email or web address], password [facility] 11/14/2014, 8:36 AM  LOS: 3 days

## 2014-11-14 NOTE — Progress Notes (Signed)
Peripherally Inserted Central Catheter/Midline Placement  The IV Nurse has discussed with the patient and/or persons authorized to consent for the patient, the purpose of this procedure and the potential benefits and risks involved with this procedure.  The benefits include less needle sticks, lab draws from the catheter and patient may be discharged home with the catheter.  Risks include, but not limited to, infection, bleeding, blood clot (thrombus formation), and puncture of an artery; nerve damage and irregular heat beat.  Alternatives to this procedure were also discussed.  PICC/Midline Placement Documentation  PICC Triple Lumen 38/93/73 PICC Right Basilic 40 cm 0 cm (Active)  Indication for Insertion or Continuance of Line Limited venous access - need for IV therapy >5 days (PICC only);Vasoactive infusions 11/14/2014 11:22 AM  Exposed Catheter (cm) 0 cm 11/14/2014 11:22 AM  Site Assessment Clean;Dry;Intact 11/14/2014 11:22 AM  Lumen #1 Status Flushed;Saline locked;Blood return noted 11/14/2014 11:22 AM  Lumen #2 Status Flushed;Saline locked;Blood return noted 11/14/2014 11:22 AM  Lumen #3 Status Flushed;Saline locked;Blood return noted 11/14/2014 11:22 AM  Dressing Type Transparent;Securing device 11/14/2014 11:22 AM  Dressing Status Clean;Dry;Antimicrobial disc in place;Intact 11/14/2014 11:22 AM  Line Care Connections checked and tightened 11/14/2014 11:22 AM  Line Adjustment (NICU/IV Team Only) No 11/14/2014 11:22 AM  Dressing Intervention New dressing 11/14/2014 11:22 AM  Dressing Change Due 11/21/14 11/14/2014 11:22 AM       Witherspoon, Essie Hart 11/14/2014, 11:23 AM

## 2014-11-14 NOTE — Progress Notes (Signed)
0751 O2 SAT 86% 4L, patient c/o SOB, R-38, P-146. RT and MD notified.

## 2014-11-14 NOTE — Progress Notes (Signed)
PT Cancellation Note  Patient Details Name: [name] [name] MRN: [id number] DOB: 1940/06/28   Cancelled Treatment:    Reason Eval/Treat Not Completed: Patient not medically ready.  Pt has had a decline in status and is now on a ventilator.  We will d/c current orders and will see her when deemed appropriate.   Demetrios Isaacs L  PT 11/14/2014, 10:33 AM [phone]

## 2014-11-14 NOTE — Consult Note (Signed)
Consult requested by: [facility] Consult requested for respiratory failure:  HPI: This is a 74 year old who is admitted with COPD and pneumonia. She had apparently been doing pretty well when this morning she was found to be poorly responsive and having difficulty breathing. CODE BLUE was called. When I came to the room she was poorly responsive breathing about 6 times per minute. She was intubated on the second attempt with a #7 endotracheal tube with good verification. She did not require any sedation for intubation. She is now on mechanical ventilation. She has COPD at baseline and has pneumonia.  Past Medical History  Diagnosis Date  . Hypertension   . Hyperlipidemia   . Cerebrovascular disease, unspecified   . Unspecified glaucoma   . Obesity, unspecified   . Coronary artery disease     cath 04/21/14:severe multivessel disease with a left dominant coronary system, chronic occlusion of the LAD, and heavily calcified severe stenosis of the proximal left circumflex.  She underwent rotational atherectomy and stenting of the left circumflex  . Ventricular tachycardia   . SVT (supraventricular tachycardia)   . AAA (abdominal aortic aneurysm)   . COPD (chronic obstructive pulmonary disease) 2013    [place] of breath     WITH ACTIVITY--USES OXYGEN AS NEEDED  2&1/2 L PER NASAL CANNUL  . Complication of anesthesia     PT STATES SHE CAN NOT BE PUT TO SLEEP BECAUSE OF HER LUNG PROBLEMS  . Carotid artery occlusion   . Anemia   . CHF (congestive heart failure)   . Abdominal aneurysm     "hasn't been repaired" (10/20/2013)  . Pneumonia X ~ 2  . On home oxygen therapy     "4L; 24/7" (10/20/2013)  . GERD (gastroesophageal reflux disease)   . Migraines     "before tubal ligation I had them alot; seldom have one anymore" (10/20/2013)  . Arthritis     "knees mainly" (10/20/2013)  . Bladder cancer 2009  . Atrial fibrillation   . Bell palsy 2014     Family History   Problem Relation Age of Onset  . Heart disease Mother 4  . Heart disease Father   . Hyperlipidemia Father   . Hypertension Father   . Diabetes Daughter   . Peripheral vascular disease Daughter      Social History   Social History  . Marital Status: Married    Spouse Name: N/A  . Number of Children: 2  . Years of Education: N/A   Occupational History  . Retired     Social History Main Topics  . Smoking status: Former Smoker -- 2.00 packs/day for 50 years    Types: Cigarettes    Quit date: 05/18/2011  . Smokeless tobacco: Never Used  . Alcohol Use: No  . Drug Use: No  . Sexual Activity: No   Other Topics Concern  . None   Social History Narrative   Lives in [place], [place] with husband.      ROS: Unobtainable    Objective: Vital signs in last 24 hours: Temp:  [97.5 F (36.4 C)-97.9 F (36.6 C)] 97.7 F (36.5 C) (09/19 0756) Pulse Rate:  [95-146] 146 (09/19 0756) Resp:  [20-38] 38 (09/19 0756) BP: (94-126)/(41-93) 126/93 mmHg (09/19 0756) SpO2:  [86 %-100 %] 100 % (09/19 0758) FiO2 (%):  [100 %] 100 % (09/19 0849) Weight change:  Last BM Date: 11/19/2014  Intake/Output from previous day: 09/18 0701 - 09/19 0700 In: 1196 [P.O.:240; I.V.:706;  IV Piggyback:250] Out: -   PHYSICAL EXAM She is essentially unresponsive. She has oxygen mask on. Her pupils react her mucous membranes are slightly dry. She had dentures in place which I removed her neck is supple without masses. Chest shows markedly diminished breath sounds. Her heart is regular without gallop. Abdomen is soft no masses felt. She does not have any edema. Central nervous system exam shows that she is very poorly responsive  Lab Results: Basic Metabolic Panel:  Recent Labs  11/13/14 0442 11/14/14 0834  NA 141 140  K 3.5 5.1  CL 93* 95*  CO2 40* 38*  GLUCOSE 147* 235*  BUN 9 14  CREATININE 0.49 0.65  CALCIUM 8.4* 8.6*   Liver Function Tests:  Recent Labs  11/01/2014 1903  AST 18  ALT  11*  ALKPHOS 73  BILITOT 0.6  PROT 6.3*  ALBUMIN 3.3*   No results for input(s): LIPASE, AMYLASE in the last 72 hours. No results for input(s): AMMONIA in the last 72 hours. CBC:  Recent Labs  11/23/2014 1903  11/13/14 0442 11/14/14 0834  WBC 14.2*  < > 8.9 22.4*  NEUTROABS 11.6*  --   --   --   HGB 10.7*  < > 9.1* 11.3*  HCT 37.3  < > 31.2* 40.3  MCV 94.0  < > 93.4 96.4  PLT 303  < > 229 454*  < > = values in this interval not displayed. Cardiac Enzymes: No results for input(s): CKTOTAL, CKMB, CKMBINDEX, TROPONINI in the last 72 hours. BNP: No results for input(s): PROBNP in the last 72 hours. D-Dimer: No results for input(s): DDIMER in the last 72 hours. CBG:  Recent Labs  11/12/14 2111 11/13/14 0731 11/13/14 1200 11/13/14 1707 11/13/14 2033 11/14/14 0752  GLUCAP 225* 133* 156* 111* 170* 166*   Hemoglobin A1C: No results for input(s): HGBA1C in the last 72 hours. Fasting Lipid Panel: No results for input(s): CHOL, HDL, LDLCALC, TRIG, CHOLHDL, LDLDIRECT in the last 72 hours. Thyroid Function Tests:  Recent Labs  11/12/2014 2324  TSH 2.686   Anemia Panel:  Recent Labs  11/13/14 0442  VITAMINB12 178*  FERRITIN 10*  TIBC 357  IRON 10*   Coagulation:  Recent Labs  11/08/2014 1903  LABPROT 13.7  INR 1.03   Urine Drug Screen: Drugs of Abuse  No results found for: LABOPIA, COCAINSCRNUR, LABBENZ, AMPHETMU, THCU, LABBARB  Alcohol Level: No results for input(s): ETH in the last 72 hours. Urinalysis: No results for input(s): COLORURINE, LABSPEC, PHURINE, GLUCOSEU, HGBUR, BILIRUBINUR, KETONESUR, PROTEINUR, UROBILINOGEN, NITRITE, LEUKOCYTESUR in the last 72 hours.  Invalid input(s): APPERANCEUR Misc. Labs:   ABGS:  Recent Labs  10/27/2014 2110  PHART 7.355  PO2ART 108*  TCO2 13.9  HCO3 31.9*     MICROBIOLOGY: Recent Results (from the past 240 hour(s))  MRSA PCR Screening     Status: None   Collection Time: 10/30/2014 11:59 PM  Result Value  Ref Range Status   MRSA by PCR NEGATIVE NEGATIVE Final    Comment:        The GeneXpert MRSA Assay (FDA approved for NASAL specimens only), is one component of a comprehensive MRSA colonization surveillance program. It is not intended to diagnose MRSA infection nor to guide or monitor treatment for MRSA infections.     Studies/Results: Dg Chest Port 1 View  11/14/2014   CLINICAL DATA:  Pneumonia, ET tube placement.  EXAM: PORTABLE CHEST - 1 VIEW  COMPARISON:  Chest x-ray dated 09/16/ 2016 and  chest x-ray dated 09/07/2014.  FINDINGS: Interval placement of an endotracheal tube with tip well positioned above the level of the carina. Enteric tube passes below the diaphragm.  Cardiomegaly appears stable. Lungs again appear hyperinflated suggesting COPD. Left lung base remains difficult to characterize due to the enlarged heart size.  Atelectasis versus scarring within the right mid lung region. There is slightly improved aeration at the right lung base. No new lung findings seen. No evidence of a large pleural effusion. No pneumothorax.  IMPRESSION: 1. Slightly improved aeration at the right lung base suggesting either resolving pneumonia or decreased atelectasis or edema. No new lung findings.  2. Cardiomegaly, unchanged.  3. Probable COPD.  4. Endotracheal tube well positioned with tip approximately 4 cm above the level of the carina.   Electronically Signed   By: Franki Cabot M.D.   On: 11/14/2014 09:12    Medications:  Prior to Admission:  Prescriptions prior to admission  Medication Sig Dispense Refill Last Dose  . acetaminophen-codeine (TYLENOL #3) 300-30 MG per tablet Take 1 tablet by mouth every 4 (four) hours as needed for moderate pain.   unknown  . amiodarone (PACERONE) 200 MG tablet Take 200 mg by mouth 2 (two) times daily.    11/21/2014 at Unknown time  . aspirin EC 81 MG tablet Take 81 mg by mouth at bedtime.   11/10/2014 at Unknown time  . budesonide-formoterol (SYMBICORT) 160-4.5  MCG/ACT inhaler Inhale 2 puffs into the lungs 2 (two) times daily. 1 Inhaler 6 11/18/2014 at Unknown time  . clonazePAM (KLONOPIN) 0.5 MG tablet Take 0.25-0.5 mg by mouth every 6 (six) hours as needed for anxiety.    11/03/2014 at Unknown time  . clopidogrel (PLAVIX) 75 MG tablet Take 1 tablet (75 mg total) by mouth at bedtime.   11/10/2014 at Unknown time  . diltiazem (CARDIZEM CD) 300 MG 24 hr capsule TAKE 1 CAPSULE IN THE EVENING. 30 capsule 1 11/10/2014 at Unknown time  . furosemide (LASIX) 40 MG tablet Take 1 tablet (40 mg total) by mouth daily. (Patient taking differently: Take 40 mg by mouth every morning. ) 30 tablet 3 11/01/2014 at Unknown time  . guaiFENesin (MUCINEX) 600 MG 12 hr tablet Take 1 tablet by mouth every 12 hours as needed for cough and congestion   11/15/2014 at Unknown time  . ipratropium (ATROVENT) 0.02 % nebulizer solution USE 1 VIAL IN NEBULIZER EVERY 6 HOURS. (Patient taking differently: USE 1 VIAL IN NEBULIZER EVERY 4 HOURS FOR SHORTNESS OF BREATH/WHEEZING) 300 mL 1 UNKNOWN  . isosorbide mononitrate (IMDUR) 30 MG 24 hr tablet TAKE ONE TABLET BY MOUTH AT BEDTIME. (Patient taking differently: TAKE ONE TABLET BY MOUTH EVERY MORNING) 30 tablet 5 11/05/2014 at Unknown time  . latanoprost (XALATAN) 0.005 % ophthalmic solution Place 1 drop into both eyes at bedtime.   10/27/2014  . levalbuterol (XOPENEX HFA) 45 MCG/ACT inhaler Inhale 2 puffs into the lungs every 4 (four) hours as needed for wheezing. (Patient taking differently: Inhale 2 puffs into the lungs every 4 (four) hours as needed for wheezing or shortness of breath. ) 15 g 6 Past Month at Unknown time  . mirtazapine (REMERON) 15 MG tablet Take 7.5 mg by mouth at bedtime. Take 1/2 tablet by mouth at bedtime   11/10/2014 at Unknown time  . nitroGLYCERIN (NITROSTAT) 0.4 MG SL tablet Place 0.4 mg under the tongue every 5 (five) minutes as needed for chest pain (may repeat x3). Chest pain   11/10/2014 at Unknown  time  . nystatin  (MYCOSTATIN) 100000 UNIT/ML suspension Take 5 mLs by mouth 4 (four) times daily.    11/22/2014 at Unknown time  . omeprazole (PRILOSEC) 20 MG capsule Take 20 mg by mouth daily as needed (for acid reflux).    unknown  . ondansetron (ZOFRAN) 8 MG tablet TAKE 1/2 TABLET ONCE DAILY AS NEEDED FOR NAUSEA   11/19/2014 at Unknown time  . OXYGEN Inhale 4.5 L into the lungs continuous.    11/12/2014 at Unknown time  . potassium chloride SA (K-DUR,KLOR-CON) 20 MEQ tablet Take 2 tablets by mouth every morning   10/30/2014 at Unknown time  . pravastatin (PRAVACHOL) 80 MG tablet Take 80 mg by mouth at bedtime.   11/10/2014 at Unknown time  . ranitidine (ZANTAC) 150 MG capsule Take 150 mg by mouth daily as needed for heartburn.    unknown at Unknown time  . tamsulosin (FLOMAX) 0.4 MG CAPS capsule TAKE 1 CAPSULE AT BEDTIME FOR INCONTINENCE.   11/10/2014 at Unknown time   Scheduled: . antiseptic oral rinse  7 mL Mouth Rinse BID  . antiseptic oral rinse  7 mL Mouth Rinse QID  . aspirin  300 mg Rectal Daily  . budesonide (PULMICORT) nebulizer solution  0.25 mg Nebulization BID  . chlorhexidine gluconate  15 mL Mouth Rinse BID  . cyanocobalamin  1,000 mcg Intramuscular Daily  . diltiazem  10 mg Intravenous Once  . enoxaparin (LOVENOX) injection  40 mg Subcutaneous Q24H  . insulin aspart  0-15 Units Subcutaneous TID WC  . insulin aspart  0-5 Units Subcutaneous QHS  . insulin glargine  7 Units Subcutaneous QHS  . latanoprost  1 drop Both Eyes QHS  . levalbuterol  0.63 mg Nebulization Q6H  . methylPREDNISolone (SOLU-MEDROL) injection  80 mg Intravenous Q6H  . pantoprazole (PROTONIX) IV  40 mg Intravenous Daily  . piperacillin-tazobactam (ZOSYN)  IV  3.375 g Intravenous Q8H  . sodium chloride  3 mL Intravenous Q12H  . vancomycin  750 mg Intravenous Q12H   Continuous: . 0.9 % NaCl with KCl 20 mEq / L 60 mL/hr at 11/13/14 0857  . diltiazem (CARDIZEM) infusion    . phenylephrine (NEO-SYNEPHRINE) Adult infusion      UPB:[id number], fentaNYL (SUBLIMAZE) injection, fentaNYL (SUBLIMAZE) injection, ipratropium, midazolam, midazolam, ondansetron **OR** ondansetron (ZOFRAN) IV  Assesment: She has healthcare associated pneumonia and now has acute on chronic hypercapnic respiratory failure. She was intubated and is being moved to the ICU and placed on mechanical ventilation Principal Problem:   HCAP (healthcare-associated pneumonia) Active Problems:   Essential hypertension   Acute exacerbation of chronic obstructive pulmonary disease (COPD)   Acute on chronic respiratory failure with hypercapnia   Chronic combined systolic and diastolic congestive heart failure   Ischemic cardiomyopathy   Hyperglycemia, drug-induced   Tachyarrhythmia   Obesity   Hypotension   Anemia, iron deficiency   Vitamin B12 deficiency    Plan: As above    LOS: 3 days   HAWKINS,EDWARD L 11/14/2014, 9:22 AM

## 2014-11-14 NOTE — Progress Notes (Signed)
Patient transferred to ICU as ordered by Dr.Fisher. Report given to Misti, RN in ICU at bedside.

## 2014-11-14 NOTE — Progress Notes (Signed)
[facility] PROGRESS NOTE  ILINA XU WUJ:[id number] DOB: 02-Dec-1940 DOA: 10/29/2014 PCP: Sherrie Mustache, MD    Code Status: Full code Family Communication: Discussed with husband Disposition Plan: Discharge when clinically appropriate   Consultants:  Pulmonologist, Dr. Luan Pulling  Procedures:  None  Antibiotics:  Zosyn 9/17>>  Vancomycin 9/17>>  HPI/Subjective: Patient complained of shortness of breath earlier this morning. Nursing reported an O2 sat of 86% on 4 L, heart rate of 146, respiratory rate of 38. She then became obtunded and difficult to arouse. Patient was given when necessary dose of clonazepam approximately one hour ago. No reported opiate given per nursing.  Objective: Filed Vitals:   11/14/14 0756  BP: 126/93  Pulse: 146  Temp: 97.7 F (36.5 C)  Resp: 38   oxygen saturation 100% on nonrebreather.  Intake/Output Summary (Last 24 hours) at 11/14/14 0859 Last data filed at 11/14/14 0646  Gross per 24 hour  Intake   1196 ml  Output      0 ml  Net   1196 ml   Filed Weights   11/04/2014 2321  Weight: 74.8 kg (164 lb 14.5 oz)    Exam:   General:  74 year old woman laying in bed; clearly different from yesterday. She is obtunded and her skin looks dusky.  Cardiovascular: Irregular, irregular, with tachycardia.  Respiratory: Breathing is nonlabored. Breathing is mildly labored. Diffusely decreased breath sounds globally.  Abdomen: Positive bowel sounds, soft, nontender, nondistended.  Musculoskeletal:/Extremities: No pedal edema.  Neurologic: She is obtunded, but responds to provocation.   Data Reviewed: Basic Metabolic Panel:  Recent Labs Lab 11/14/2014 1903 11/12/14 0431 11/13/14 0442  NA 137 139 141  K 3.8 3.7 3.5  CL 92* 93* 93*  CO2 36* 39* 40*  GLUCOSE 128* 139* 147*  BUN 9 9 9   CREATININE 0.62 0.62 0.49  CALCIUM 8.6* 8.4* 8.4*   Liver Function Tests:  Recent Labs Lab 11/20/2014 1903  AST 18  ALT 11*  ALKPHOS  73  BILITOT 0.6  PROT 6.3*  ALBUMIN 3.3*   No results for input(s): LIPASE, AMYLASE in the last 168 hours. No results for input(s): AMMONIA in the last 168 hours. CBC:  Recent Labs Lab 11/12/2014 1903 11/12/14 0431 11/13/14 0442 11/14/14 0834  WBC 14.2* 11.2* 8.9 22.4*  NEUTROABS 11.6*  --   --   --   HGB 10.7* 10.1* 9.1* 11.3*  HCT 37.3 34.9* 31.2* 40.3  MCV 94.0 94.3 93.4 96.4  PLT 303 253 229 454*   Cardiac Enzymes: No results for input(s): CKTOTAL, CKMB, CKMBINDEX, TROPONINI in the last 168 hours. BNP (last 3 results)  Recent Labs  04/15/14 1423 04/26/14 1358 10/31/2014 1903  BNP 229.9* 294.0* 549.0*    ProBNP (last 3 results)  Recent Labs  12/29/13 1238 04/15/14 1417 10/18/14 1456  PROBNP 195.0* 230.0* 288.0*    CBG:  Recent Labs Lab 11/13/14 0731 11/13/14 1200 11/13/14 1707 11/13/14 2033 11/14/14 0752  GLUCAP 133* 156* 111* 170* 166*    Recent Results (from the past 240 hour(s))  MRSA PCR Screening     Status: None   Collection Time: 11/17/2014 11:59 PM  Result Value Ref Range Status   MRSA by PCR NEGATIVE NEGATIVE Final    Comment:        The GeneXpert MRSA Assay (FDA approved for NASAL specimens only), is one component of a comprehensive MRSA colonization surveillance program. It is not intended to diagnose MRSA infection nor to guide or monitor treatment for MRSA infections.  Studies: No results found.  Scheduled Meds: . amiodarone  200 mg Oral BID  . antiseptic oral rinse  7 mL Mouth Rinse BID  . antiseptic oral rinse  7 mL Mouth Rinse QID  . aspirin EC  81 mg Oral QHS  . budesonide-formoterol  2 puff Inhalation BID  . chlorhexidine gluconate  15 mL Mouth Rinse BID  . clopidogrel  75 mg Oral QHS  . cyanocobalamin  1,000 mcg Intramuscular Daily  . diltiazem  240 mg Oral Daily  . diltiazem  10 mg Intravenous STAT  . enoxaparin (LOVENOX) injection  40 mg Subcutaneous Q24H  . furosemide  40 mg Oral [id number]  . insulin aspart   0-15 Units Subcutaneous TID WC  . insulin aspart  0-5 Units Subcutaneous QHS  . insulin glargine  7 Units Subcutaneous QHS  . isosorbide mononitrate  30 mg Oral q morning - 10a  . latanoprost  1 drop Both Eyes QHS  . levalbuterol  0.63 mg Nebulization Q6H  . methylPREDNISolone (SOLU-MEDROL) injection  80 mg Intravenous Q6H  . mirtazapine  7.5 mg Oral QHS  . pantoprazole (PROTONIX) IV  40 mg Intravenous Daily  . piperacillin-tazobactam (ZOSYN)  IV  3.375 g Intravenous Q8H  . potassium chloride SA  20 mEq Oral BID  . pravastatin  80 mg Oral QHS  . sodium chloride  3 mL Intravenous Q12H  . vancomycin  750 mg Intravenous Q12H   Continuous Infusions: . 0.9 % NaCl with KCl 20 mEq / L 60 mL/hr at 11/13/14 0857  . phenylephrine (NEO-SYNEPHRINE) Adult infusion      Assessment and plan:  Principal Problem:   HCAP (healthcare-associated pneumonia) Active Problems:   Acute exacerbation of chronic obstructive pulmonary disease (COPD)   Acute on chronic respiratory failure with hypercapnia   Chronic combined systolic and diastolic congestive heart failure   Ischemic cardiomyopathy   Tachyarrhythmia   Essential hypertension   Hyperglycemia, drug-induced   Obesity   Hypotension   Anemia, iron deficiency   Vitamin B12 deficiency    1. Acute encephalopathy with worsening pulmonary status-acute on chronic respiratory failure now requiring intubation for airway protection. Nursing called this morning when the patient complained of shortness of breath. Her heart rate was in the 140s and her oxygen saturation was in the 80s on 4 L of nasal cannula oxygen. She subsequently became obtunded with obvious air hunger and global decreased breath sounds.  -Patient was placed on a 100% nonrebreather. Pulmonologist, Dr. Luan Pulling was consulted for mechanical intubation and ventilation. Patient was intubated regardless of her findings of ABG which were difficult to obtain per RT because of patient's low blood  pressure. Patient is being moved to the ICU. Ventilator management and settings per Dr. Luan Pulling. -Patient was given 10 mg of Cardizem for tachyarrhythmia which may have decreased her systolic blood pressure to the 60s to 70s when it had been in the 120s prior to the diltiazem. -We'll start Neo-Synephrine drip due to hypotension. We'll hold Lasix and diltiazem until blood pressure improves. -We'll continue nebulizers, IV fluids, steroids, and supportive treatment. -Stat chest x-ray, ABG, EKG were ordered; results pending. CBC, BMET, troponin I ordered and are pending. -Discussed the patient's clinical findings with her husband. He recommended a full code but stated if the patient "did not have a chance" then he did not one her on the ventilator. Therefore, intubation was established.   Healthcare acquired pneumonia. On admission, the patient's chest x-ray revealed right mid to lower lung pneumonia/pneumonitis. Her  white blood cell count was elevated at 14.2. She was afebrile on admission. She was started on Zosyn and vancomycin. Her white blood cell count has normalized.   Acute COPD exacerbation causing acute on chronic respiratory failure with hypercapnia. Patient is treated chronically with 4-5 L of oxygen 24/7. Patient's ABG on admission revealed pH of 7.35, PCO2 of 64, and PO2 of 108. She was started on BiPAP and her oxygen was titrated accordingly. IV Solu-Medrol and duo nebulizers were started. Symbicort was restarted. -Management as above in #1. Will increase Solu-Medrol to 80 mg every 6 hours. -Because of the tachyarrhythmias, will discontinue DuoNeb and start Xopenex every 4 hours.  History of tachyarrhythmias. Per review of the patient's chart, she has a history of V. tach and SVT. Her EKG on admission revealed atrial flutter. We'll was continued amiodarone and diltiazem for treatment.  -She is tachycardic; irregular rhythm on exam; EKG pending. -She was given 10 mg of IV diltiazem  for heart rate in the 140s. -Now that she is intubated, will hold on oral diltiazem and start diltiazem drip if her blood pressure improves on the pressor. Also consider amiodarone drip, but will ask cardiology to assist if amiodarone drip is needed.  Ischemic cardiomyopathy with chronic combined heart failure. Echocardiogram February 2016 revealed an EF of 40-45% and grade 1 diastolic dysfunction. This appears to be compensated; no evidence of pulmonary edema on chest x-ray. She was continued on Lasix, Plavix, isosorbide mononitrate, and pravastatin; these will be held secondary to intubation. -Could add nitroglycerin ointment if her blood pressure can tolerate it.  Hyperglycemia, likely steroid-induced. Sliding scale NovoLog and Lantus were started. Her CBGs are reasonable.  Normocytic anemia; iron deficiency; vitamin B12 deficiency.  Patient's hemoglobin ranges from 10.0-11.0.  Iron studies revealed a low ferritin of 10, low iron of 10, TIBC of 357, and low vitamin B12 of 178. Total iron, ferritin, and vitamin B12 ordered and are pending. Her TSH was within normal limits. -We'll start vitamin B12 supplementation. We'll hold off on iron infusion and packed red blood cells as her hemoglobin is stable.       Time spent: 50 minutes of critical care time.    [facility] Pager [phone]. If 7PM-7AM, please contact night-coverage at [email or web address], password [email or web address] 11/14/2014, 8:59 AM  LOS: 3 days

## 2014-11-15 ENCOUNTER — Inpatient Hospital Stay (HOSPITAL_COMMUNITY): Payer: Medicare Other

## 2014-11-15 DIAGNOSIS — I509 Heart failure, unspecified: Secondary | ICD-10-CM

## 2014-11-15 DIAGNOSIS — D509 Iron deficiency anemia, unspecified: Secondary | ICD-10-CM

## 2014-11-15 LAB — COMPREHENSIVE METABOLIC PANEL
ALK PHOS: 55 U/L (ref 38–126)
ALT: 40 U/L (ref 14–54)
ANION GAP: 8 (ref 5–15)
AST: 64 U/L — ABNORMAL HIGH (ref 15–41)
Albumin: 2.8 g/dL — ABNORMAL LOW (ref 3.5–5.0)
BILIRUBIN TOTAL: 0.8 mg/dL (ref 0.3–1.2)
BUN: 15 mg/dL (ref 6–20)
CALCIUM: 7.8 mg/dL — AB (ref 8.9–10.3)
CO2: 36 mmol/L — ABNORMAL HIGH (ref 22–32)
Chloride: 97 mmol/L — ABNORMAL LOW (ref 101–111)
Creatinine, Ser: 0.62 mg/dL (ref 0.44–1.00)
GFR calc non Af Amer: >60 mL/min (ref >60)
Glucose, Bld: 112 mg/dL — ABNORMAL HIGH (ref 65–99)
POTASSIUM: 3.7 mmol/L (ref 3.5–5.1)
Sodium: 141 mmol/L (ref 135–145)
TOTAL PROTEIN: 5.5 g/dL — AB (ref 6.5–8.1)

## 2014-11-15 LAB — GLUCOSE, CAPILLARY
GLUCOSE-CAPILLARY: 92 mg/dL (ref 65–99)
GLUCOSE-CAPILLARY: 108 mg/dL — AB (ref 65–99)
GLUCOSE-CAPILLARY: 118 mg/dL — AB (ref 65–99)
GLUCOSE-CAPILLARY: 120 mg/dL — AB (ref 65–99)

## 2014-11-15 LAB — BLOOD GAS, ARTERIAL
ACID-BASE EXCESS: 9.9 mmol/L — AB (ref 0.0–2.0)
Bicarbonate: 32.8 mEq/L — ABNORMAL HIGH (ref 20.0–24.0)
DRAWN BY: 317771
FIO2: 0.4
O2 SAT: 89.6 %
PCO2 ART: 51.2 mmHg — AB (ref 35.0–45.0)
PEEP/CPAP: 5 cmH2O
PH ART: 7.443 (ref 7.350–7.450)
RATE: 16 resp/min
TCO2: 14 mmol/L (ref 0–100)
VT: 420 mL
pO2, Arterial: 62.7 mmHg — ABNORMAL LOW (ref 80.0–100.0)

## 2014-11-15 LAB — CBC
HCT: 34.7 % — ABNORMAL LOW (ref 36.0–46.0)
HEMOGLOBIN: 10.1 g/dL — AB (ref 12.0–15.0)
MCH: 26.9 pg (ref 26.0–34.0)
MCHC: 29.1 g/dL — ABNORMAL LOW (ref 30.0–36.0)
MCV: 92.3 fL (ref 78.0–100.0)
PLATELETS: 403 10*3/uL — AB (ref 150–400)
RBC: 3.76 MIL/uL — AB (ref 3.87–5.11)
RDW: 16.4 % — ABNORMAL HIGH (ref 11.5–15.5)
WBC: 15.3 10*3/uL — AB (ref 4.0–10.5)

## 2014-11-15 LAB — LACTIC ACID, PLASMA: Lactic Acid, Venous: 1.3 mmol/L (ref 0.5–2.0)

## 2014-11-15 LAB — VANCOMYCIN, TROUGH: VANCOMYCIN TR: 17 ug/mL (ref 10.0–20.0)

## 2014-11-15 MED ORDER — SODIUM CHLORIDE 0.9 % IV SOLN
25.0000 ug/h | INTRAVENOUS | Status: DC
  Administered 2014-11-15: 25 ug/h via INTRAVENOUS
  Administered 2014-11-16: 100 ug/h via INTRAVENOUS
  Administered 2014-11-17 – 2014-11-19 (×2): 150 ug/h via INTRAVENOUS
  Administered 2014-11-19: 125 ug/h via INTRAVENOUS
  Filled 2014-11-15 (×2): qty 50

## 2014-11-15 MED ORDER — KCL IN DEXTROSE-NACL 20-5-0.9 MEQ/L-%-% IV SOLN: INTRAVENOUS | Status: DC

## 2014-11-15 MED ORDER — INSULIN ASPART 100 UNIT/ML ~~LOC~~ SOLN
0.0000 [IU] | SUBCUTANEOUS | Status: DC
  Administered 2014-11-16 – 2014-11-18 (×4): 2 [IU] via SUBCUTANEOUS
  Administered 2014-11-18: 3 [IU] via SUBCUTANEOUS
  Administered 2014-11-18 – 2014-11-20 (×4): 2 [IU] via SUBCUTANEOUS
  Administered 2014-11-20: 3 [IU] via SUBCUTANEOUS
  Administered 2014-11-20 – 2014-11-21 (×3): 2 [IU] via SUBCUTANEOUS
  Administered 2014-11-22: 3 [IU] via SUBCUTANEOUS
  Administered 2014-11-22: 2 [IU] via SUBCUTANEOUS
  Administered 2014-11-22: 3 [IU] via SUBCUTANEOUS
  Administered 2014-11-22: 2 [IU] via SUBCUTANEOUS

## 2014-11-15 MED ORDER — SODIUM CHLORIDE 0.9 % IV SOLN
1.0000 mg/h | INTRAVENOUS | Status: DC
  Administered 2014-11-15 – 2014-11-20 (×4): 1 mg/h via INTRAVENOUS
  Filled 2014-11-15 (×3): qty 10

## 2014-11-15 MED ORDER — INSULIN GLARGINE 100 UNIT/ML ~~LOC~~ SOLN
10.0000 [IU] | Freq: Two times a day (BID) | SUBCUTANEOUS | Status: DC
  Administered 2014-11-15 – 2014-11-16 (×3): 10 [IU] via SUBCUTANEOUS
  Filled 2014-11-15 (×5): qty 0.1

## 2014-11-15 MED ORDER — POTASSIUM CHLORIDE IN NACL 20-0.9 MEQ/L-% IV SOLN
INTRAVENOUS | Status: DC
  Administered 2014-11-15 – 2014-11-16 (×4): via INTRAVENOUS

## 2014-11-15 NOTE — Progress Notes (Signed)
Subjective: She remains intubated and on mechanical ventilation. She is sedated and not really responsive right now. She has been hypotensive and required pressor support and she has required amiodarone because of atrial fibrillation with rapid ventricular response. Her troponin level is mildly elevated likely due to demand ischemia.  Objective: Vital signs in last 24 hours: Temp:  [97.1 F (36.2 C)-98.4 F (36.9 C)] 98.4 F (36.9 C) (09/20 0456) Pulse Rate:  [95-153] 101 (09/20 0600) Resp:  [14-38] 17 (09/20 0600) BP: (70-153)/(46-126) 122/65 mmHg (09/20 0600) SpO2:  [86 %-100 %] 96 % (09/20 0600) FiO2 (%):  [40 %-100 %] 40 % (09/20 0300) Weight:  [77.6 kg (171 lb 1.2 oz)] 77.6 kg (171 lb 1.2 oz) (09/20 0456) Weight change:  Last BM Date: 10/29/2014  Intake/Output from previous day: 09/19 0701 - 09/20 0700 In: 3866.6 [I.V.:3466.6; IV Piggyback:400] Out: 2350 [Urine:2350]  PHYSICAL EXAM General appearance: She is intubated and on the ventilator. She is obese Resp: rhonchi bilaterally Cardio: irregularly irregular rhythm GI: soft, non-tender; bowel sounds normal; no masses,  no organomegaly Extremities: extremities normal, atraumatic, no cyanosis or edema  Lab Results:  Results for orders placed or performed during the hospital encounter of 11/07/2014 (from the past 48 hour(s))  Glucose, capillary     Status: Abnormal   Collection Time: 11/13/14 12:00 PM  Result Value Ref Range   Glucose-Capillary 156 (H) 65 - 99 mg/dL   Comment 1 Notify RN   Glucose, capillary     Status: Abnormal   Collection Time: 11/13/14  5:07 PM  Result Value Ref Range   Glucose-Capillary 111 (H) 65 - 99 mg/dL   Comment 1 Notify RN   Glucose, capillary     Status: Abnormal   Collection Time: 11/13/14  8:33 PM  Result Value Ref Range   Glucose-Capillary 170 (H) 65 - 99 mg/dL   Comment 1 Notify RN    Comment 2 Document in Chart   Glucose, capillary     Status: Abnormal   Collection Time: 11/14/14   7:52 AM  Result Value Ref Range   Glucose-Capillary 166 (H) 65 - 99 mg/dL   Comment 1 Notify RN   Troponin I (q 6hr x 3)     Status: None   Collection Time: 11/14/14  8:33 AM  Result Value Ref Range   Troponin I <0.03 <0.031 ng/mL    Comment:        NO INDICATION OF MYOCARDIAL INJURY.   Basic metabolic panel     Status: Abnormal   Collection Time: 11/14/14  8:34 AM  Result Value Ref Range   Sodium 140 135 - 145 mmol/L   Potassium 5.1 3.5 - 5.1 mmol/L    Comment: DELTA CHECK NOTED   Chloride 95 (L) 101 - 111 mmol/L   CO2 38 (H) 22 - 32 mmol/L   Glucose, Bld 235 (H) 65 - 99 mg/dL   BUN 14 6 - 20 mg/dL   Creatinine, Ser 0.65 0.44 - 1.00 mg/dL   Calcium 8.6 (L) 8.9 - 10.3 mg/dL   GFR calc non Af Amer >60 >60 mL/min   GFR calc Af Amer >60 >60 mL/min    Comment: (NOTE) The eGFR has been calculated using the CKD EPI equation. This calculation has not been validated in all clinical situations. eGFR's persistently <60 mL/min signify possible Chronic Kidney Disease.    Anion gap 7 5 - 15  CBC     Status: Abnormal   Collection Time: 11/14/14  8:34  AM  Result Value Ref Range   WBC 22.4 (H) 4.0 - 10.5 K/uL   RBC 4.18 3.87 - 5.11 MIL/uL   Hemoglobin 11.3 (L) 12.0 - 15.0 g/dL   HCT 40.3 36.0 - 46.0 %   MCV 96.4 78.0 - 100.0 fL   MCH 27.0 26.0 - 34.0 pg   MCHC 28.0 (L) 30.0 - 36.0 g/dL   RDW 15.9 (H) 11.5 - 15.5 %   Platelets 454 (H) 150 - 400 K/uL  Draw ABG 1 hour after initiation of ventilator     Status: Abnormal   Collection Time: 11/14/14  9:35 AM  Result Value Ref Range   FIO2 100.00    Delivery systems VENTILATOR    Mode PRESSURE REGULATED VOLUME CONTROL    VT 420 mL   LHR 16 resp/min   Peep/cpap 5.0 cm H20   pH, Arterial 7.340 (L) 7.350 - 7.450   pCO2 arterial 59.6 (HH) 35.0 - 45.0 mmHg    Comment: CRITICAL RESULT CALLED TO, READ BACK BY AND VERIFIED WITH: MICHAEL GRAY RN BY ROBIN POWELLRRT AT 0946 CRITICAL RESULT CALLED TO, READ BACK BY AND VERIFIED WITH:  MICHAEL  GRAY RN BY ROBIN POWELL RRT AT 0945 ON 11/14/14    pO2, Arterial 273 (H) 80.0 - 100.0 mmHg   Bicarbonate 28.8 (H) 20.0 - 24.0 mEq/L   Acid-Base Excess 5.8 (H) 0.0 - 2.0 mmol/L   O2 Saturation 98.7 %   Collection site REVIEWED BY    Drawn by [id number]    Sample type ARTERIAL    Allens test (pass/fail) PASS PASS  Glucose, capillary     Status: Abnormal   Collection Time: 11/14/14 10:25 AM  Result Value Ref Range   Glucose-Capillary 237 (H) 65 - 99 mg/dL   Comment 1 Document in Chart    Comment 2 Repeat Test   Glucose, capillary     Status: Abnormal   Collection Time: 11/14/14 11:53 AM  Result Value Ref Range   Glucose-Capillary 194 (H) 65 - 99 mg/dL   Comment 1 Notify RN    Comment 2 Document in Chart   Troponin I (q 6hr x 3)     Status: Abnormal   Collection Time: 11/14/14  2:25 PM  Result Value Ref Range   Troponin I 0.05 (H) <0.031 ng/mL    Comment:        PERSISTENTLY INCREASED TROPONIN VALUES IN THE RANGE OF 0.04-0.49 ng/mL CAN BE SEEN IN:       -UNSTABLE ANGINA       -CONGESTIVE HEART FAILURE       -MYOCARDITIS       -CHEST TRAUMA       -ARRYHTHMIAS       -LATE PRESENTING MYOCARDIAL INFARCTION       -COPD   CLINICAL FOLLOW-UP RECOMMENDED.   Glucose, capillary     Status: None   Collection Time: 11/14/14  5:09 PM  Result Value Ref Range   Glucose-Capillary 79 65 - 99 mg/dL   Comment 1 Notify RN    Comment 2 Document in Chart   Troponin I (q 6hr x 3)     Status: Abnormal   Collection Time: 11/14/14  8:00 PM  Result Value Ref Range   Troponin I 0.07 (H) <0.031 ng/mL    Comment:        PERSISTENTLY INCREASED TROPONIN VALUES IN THE RANGE OF 0.04-0.49 ng/mL CAN BE SEEN IN:       -UNSTABLE ANGINA       -  CONGESTIVE HEART FAILURE       -MYOCARDITIS       -CHEST TRAUMA       -ARRYHTHMIAS       -LATE PRESENTING MYOCARDIAL INFARCTION       -COPD   CLINICAL FOLLOW-UP RECOMMENDED.   Glucose, capillary     Status: Abnormal   Collection Time: 11/14/14  9:43 PM  Result  Value Ref Range   Glucose-Capillary 100 (H) 65 - 99 mg/dL  Blood gas, arterial     Status: Abnormal   Collection Time: 11/15/14  3:50 AM  Result Value Ref Range   FIO2 0.40    Delivery systems VENTILATOR    Mode PRESSURE REGULATED VOLUME CONTROL    VT 420 mL   LHR 16.0 resp/min   Peep/cpap 5.0 cm H20   pH, Arterial 7.443 7.350 - 7.450   pCO2 arterial 51.2 (H) 35.0 - 45.0 mmHg   pO2, Arterial 62.7 (L) 80.0 - 100.0 mmHg   Bicarbonate 32.8 (H) 20.0 - 24.0 mEq/L   TCO2 14.0 0 - 100 mmol/L   Acid-Base Excess 9.9 (H) 0.0 - 2.0 mmol/L   O2 Saturation 89.6 %   Collection site LEFT RADIAL    Drawn by [id number]    Sample type ARTERIAL DRAW    Allens test (pass/fail) PASS PASS  CBC     Status: Abnormal   Collection Time: 11/15/14  4:15 AM  Result Value Ref Range   WBC 15.3 (H) 4.0 - 10.5 K/uL   RBC 3.76 (L) 3.87 - 5.11 MIL/uL   Hemoglobin 10.1 (L) 12.0 - 15.0 g/dL   HCT 34.7 (L) 36.0 - 46.0 %   MCV 92.3 78.0 - 100.0 fL   MCH 26.9 26.0 - 34.0 pg   MCHC 29.1 (L) 30.0 - 36.0 g/dL   RDW 16.4 (H) 11.5 - 15.5 %   Platelets 403 (H) 150 - 400 K/uL  Comprehensive metabolic panel     Status: Abnormal   Collection Time: 11/15/14  4:15 AM  Result Value Ref Range   Sodium 141 135 - 145 mmol/L   Potassium 3.7 3.5 - 5.1 mmol/L    Comment: DELTA CHECK NOTED   Chloride 97 (L) 101 - 111 mmol/L   CO2 36 (H) 22 - 32 mmol/L   Glucose, Bld 112 (H) 65 - 99 mg/dL   BUN 15 6 - 20 mg/dL   Creatinine, Ser 0.62 0.44 - 1.00 mg/dL   Calcium 7.8 (L) 8.9 - 10.3 mg/dL   Total Protein 5.5 (L) 6.5 - 8.1 g/dL   Albumin 2.8 (L) 3.5 - 5.0 g/dL   AST 64 (H) 15 - 41 U/L   ALT 40 14 - 54 U/L   Alkaline Phosphatase 55 38 - 126 U/L   Total Bilirubin 0.8 0.3 - 1.2 mg/dL   GFR calc non Af Amer >60 >60 mL/min   GFR calc Af Amer >60 >60 mL/min    Comment: (NOTE) The eGFR has been calculated using the CKD EPI equation. This calculation has not been validated in all clinical situations. eGFR's persistently <60 mL/min  signify possible Chronic Kidney Disease.    Anion gap 8 5 - 15    ABGS  Recent Labs  11/15/14 0350  PHART 7.443  PO2ART 62.7*  TCO2 14.0  HCO3 32.8*   CULTURES Recent Results (from the past 240 hour(s))  MRSA PCR Screening     Status: None   Collection Time: 10/28/2014 11:59 PM  Result Value Ref Range Status  MRSA by PCR NEGATIVE NEGATIVE Final    Comment:        The GeneXpert MRSA Assay (FDA approved for NASAL specimens only), is one component of a comprehensive MRSA colonization surveillance program. It is not intended to diagnose MRSA infection nor to guide or monitor treatment for MRSA infections.    Studies/Results: Dg Chest Port 1 View  11/14/2014   CLINICAL DATA:  Pneumonia, ET tube placement.  EXAM: PORTABLE CHEST - 1 VIEW  COMPARISON:  Chest x-ray dated 09/16/ 2016 and chest x-ray dated 09/07/2014.  FINDINGS: Interval placement of an endotracheal tube with tip well positioned above the level of the carina. Enteric tube passes below the diaphragm.  Cardiomegaly appears stable. Lungs again appear hyperinflated suggesting COPD. Left lung base remains difficult to characterize due to the enlarged heart size.  Atelectasis versus scarring within the right mid lung region. There is slightly improved aeration at the right lung base. No new lung findings seen. No evidence of a large pleural effusion. No pneumothorax.  IMPRESSION: 1. Slightly improved aeration at the right lung base suggesting either resolving pneumonia or decreased atelectasis or edema. No new lung findings.  2. Cardiomegaly, unchanged.  3. Probable COPD.  4. Endotracheal tube well positioned with tip approximately 4 cm above the level of the carina.   Electronically Signed   By: Franki Cabot M.D.   On: 11/14/2014 09:12   Dg Chest Port 1v Same Day  11/14/2014   CLINICAL DATA:  Right side PICC line placement.  EXAM: PORTABLE CHEST - 1 VIEW SAME DAY  COMPARISON:  11/14/2014  FINDINGS: Right PICC line is in place  with the tip in the SVC. Endotracheal tube and NG tube are unchanged. There is cardiomegaly. Bilateral lower lobe airspace opacities with small effusions. Mild vascular congestion.  IMPRESSION: Right PICC line tip in the SVC.  Continued cardiomegaly with vascular congestion.  Small effusions with bibasilar atelectasis or infiltrates.   Electronically Signed   By: Rolm Baptise M.D.   On: 11/14/2014 11:45    Medications:  Prior to Admission:  Prescriptions prior to admission  Medication Sig Dispense Refill Last Dose  . acetaminophen-codeine (TYLENOL #3) 300-30 MG per tablet Take 1 tablet by mouth every 4 (four) hours as needed for moderate pain.   unknown  . amiodarone (PACERONE) 200 MG tablet Take 200 mg by mouth 2 (two) times daily.    10/31/2014 at Unknown time  . aspirin EC 81 MG tablet Take 81 mg by mouth at bedtime.   11/10/2014 at Unknown time  . budesonide-formoterol (SYMBICORT) 160-4.5 MCG/ACT inhaler Inhale 2 puffs into the lungs 2 (two) times daily. 1 Inhaler 6 10/28/2014 at Unknown time  . clonazePAM (KLONOPIN) 0.5 MG tablet Take 0.25-0.5 mg by mouth every 6 (six) hours as needed for anxiety.    11/19/2014 at Unknown time  . clopidogrel (PLAVIX) 75 MG tablet Take 1 tablet (75 mg total) by mouth at bedtime.   11/10/2014 at Unknown time  . diltiazem (CARDIZEM CD) 300 MG 24 hr capsule TAKE 1 CAPSULE IN THE EVENING. 30 capsule 1 11/10/2014 at Unknown time  . furosemide (LASIX) 40 MG tablet Take 1 tablet (40 mg total) by mouth daily. (Patient taking differently: Take 40 mg by mouth every morning. ) 30 tablet 3 10/28/2014 at Unknown time  . guaiFENesin (MUCINEX) 600 MG 12 hr tablet Take 1 tablet by mouth every 12 hours as needed for cough and congestion   10/31/2014 at Unknown time  . ipratropium (  ATROVENT) 0.02 % nebulizer solution USE 1 VIAL IN NEBULIZER EVERY 6 HOURS. (Patient taking differently: USE 1 VIAL IN NEBULIZER EVERY 4 HOURS FOR SHORTNESS OF BREATH/WHEEZING) 300 mL 1 UNKNOWN  . isosorbide  mononitrate (IMDUR) 30 MG 24 hr tablet TAKE ONE TABLET BY MOUTH AT BEDTIME. (Patient taking differently: TAKE ONE TABLET BY MOUTH EVERY MORNING) 30 tablet 5 11/23/2014 at Unknown time  . latanoprost (XALATAN) 0.005 % ophthalmic solution Place 1 drop into both eyes at bedtime.   11/05/2014  . levalbuterol (XOPENEX HFA) 45 MCG/ACT inhaler Inhale 2 puffs into the lungs every 4 (four) hours as needed for wheezing. (Patient taking differently: Inhale 2 puffs into the lungs every 4 (four) hours as needed for wheezing or shortness of breath. ) 15 g 6 Past Month at Unknown time  . mirtazapine (REMERON) 15 MG tablet Take 7.5 mg by mouth at bedtime. Take 1/2 tablet by mouth at bedtime   11/10/2014 at Unknown time  . nitroGLYCERIN (NITROSTAT) 0.4 MG SL tablet Place 0.4 mg under the tongue every 5 (five) minutes as needed for chest pain (may repeat x3). Chest pain   11/10/2014 at Unknown time  . nystatin (MYCOSTATIN) 100000 UNIT/ML suspension Take 5 mLs by mouth 4 (four) times daily.    11/14/2014 at Unknown time  . omeprazole (PRILOSEC) 20 MG capsule Take 20 mg by mouth daily as needed (for acid reflux).    unknown  . ondansetron (ZOFRAN) 8 MG tablet TAKE 1/2 TABLET ONCE DAILY AS NEEDED FOR NAUSEA   11/19/2014 at Unknown time  . OXYGEN Inhale 4.5 L into the lungs continuous.    11/09/2014 at Unknown time  . potassium chloride SA (K-DUR,KLOR-CON) 20 MEQ tablet Take 2 tablets by mouth every morning   11/16/2014 at Unknown time  . pravastatin (PRAVACHOL) 80 MG tablet Take 80 mg by mouth at bedtime.   11/10/2014 at Unknown time  . ranitidine (ZANTAC) 150 MG capsule Take 150 mg by mouth daily as needed for heartburn.    unknown at Unknown time  . tamsulosin (FLOMAX) 0.4 MG CAPS capsule TAKE 1 CAPSULE AT BEDTIME FOR INCONTINENCE.   11/10/2014 at Unknown time   Scheduled: . antiseptic oral rinse  7 mL Mouth Rinse BID  . antiseptic oral rinse  7 mL Mouth Rinse QID  . aspirin  300 mg Rectal Daily  . budesonide (PULMICORT)  nebulizer solution  0.25 mg Nebulization BID  . chlorhexidine gluconate  15 mL Mouth Rinse BID  . cyanocobalamin  1,000 mcg Intramuscular Daily  . enoxaparin (LOVENOX) injection  40 mg Subcutaneous Q24H  . insulin aspart  0-15 Units Subcutaneous TID WC  . insulin aspart  0-5 Units Subcutaneous QHS  . insulin glargine  7 Units Subcutaneous QHS  . latanoprost  1 drop Both Eyes QHS  . levalbuterol  0.63 mg Nebulization Q6H  . methylPREDNISolone (SOLU-MEDROL) injection  80 mg Intravenous Q6H  . pantoprazole (PROTONIX) IV  40 mg Intravenous Daily  . piperacillin-tazobactam (ZOSYN)  IV  3.375 g Intravenous Q8H  . sodium chloride  10-40 mL Intracatheter Q12H  . sodium chloride  3 mL Intravenous Q12H  . vancomycin  750 mg Intravenous Q12H   Continuous: . 0.9 % NaCl with KCl 20 mEq / L 125 mL/hr at 11/15/14 0500  . amiodarone 30 mg/hr (11/14/14 2137)  . phenylephrine (NEO-SYNEPHRINE) Adult infusion 20 mcg/min (11/15/14 0500)   [id number], fentaNYL (SUBLIMAZE) injection, fentaNYL (SUBLIMAZE) injection, ipratropium, midazolam, midazolam, ondansetron **OR** ondansetron (ZOFRAN) IV, sodium chloride  Assesment:  She has healthcare associated pneumonia and COPD exacerbation. She has required pressor support after being intubated. She certainly may have some element of sepsis. She is on broad-spectrum antibiotics, on Neo-Synephrine to maintain her blood pressure but that has improved and her dose has been able to be diminished. She is on amiodarone for heart rate control and her heart rate is significantly better. She is on 40% oxygen with marginal blood gas. Chest x-ray shows little change. She has cardiomegaly and looks like she probably has small bilateral pleural effusions.  She has elevated troponin level and probably has demand ischemia producing this. She is having echocardiogram this morning.     Principal Problem:   HCAP (healthcare-associated pneumonia) Active Problems:   Essential  hypertension   Acute exacerbation of chronic obstructive pulmonary disease (COPD)   Acute on chronic respiratory failure with hypercapnia   Chronic combined systolic and diastolic congestive heart failure   Ischemic cardiomyopathy   Hyperglycemia, drug-induced   Tachyarrhythmia   Obesity   Hypotension   Anemia, iron deficiency   Vitamin B12 deficiency    Plan: Continue current treatments. Check lactate level for completeness.    LOS: 4 days   HAWKINS,EDWARD L 11/15/2014, 7:54 AM

## 2014-11-15 NOTE — Progress Notes (Signed)
Pt was very agitated and was telling her husband she wanted the ET tube out.. RT placed pt on wean and checked her NIF and FVC. The Pt was not strong enough to wean to extubate. The nurse gave the Pt more sedation.

## 2014-11-15 NOTE — Progress Notes (Signed)
Initial Nutrition Assessment  DOCUMENTATION CODES:  Not applicable  INTERVENTION:  If unable to extubate within 24 hours and medically able, recommend TF. Initiate Vital AF 1.2 @ 20 ml/hr via OT/Ngt and increase by 10 ml every 4 hours to goal rate of 50 ml/hr.   Tube feeding regimen provides 1440 kcal (97% of needs), 90 grams of protein, and 973 ml of H2O.   NUTRITION DIAGNOSIS:  Inadequate oral intake related to inability to eat as evidenced by NPO status.  GOAL:  Patient will meet greater than or equal to 90% of their needs  MONITOR:  Vent status, Labs, Weight trends, I & O's, TF tolerance  REASON FOR ASSESSMENT:  Ventilator    ASSESSMENT:  74 y.o. female PMHx severe COPD on home o2, CHF, CAD, HTN, HLD, prior CVA, presented to the ER with progressive SOB. She had a venous blood gas, showing pH of 7.28, and a CXR showing new right sided infiltrate, being treated for HCAP and AoC COPD. Intubated 9/19 am due to difficulty breathing and AoC Respiratory failure.   No family/friends present to obtain weight/diet hx from. Last meal was 9/18. Nfpe: wdl  Per nurse, Will remain intubated today and extubation to again be attempted tomorrow.  Patient is currently intubated on ventilator support MV: 7.7 L/min Temp (24hrs), Avg:98.3 F (36.8 C), Min:97.3 F (36.3 C), Max:99.3 F (37.4 C)  Propofol: None   Diet Order:  Diet NPO time specified  Skin:  Blister R leg, scattered ecchymosis. Dry skin  Last BM:  9/16  Height:  Ht Readings from Last 1 Encounters:  11/07/2014 5\' 3"  (1.6 m)  Height has mostly been recorded as  5\' 5"   Weight:  Wt Readings from Last 1 Encounters:  11/15/14 171 lb 1.2 oz (77.6 kg)   Wt Readings from Last 10 Encounters:  11/15/14 171 lb 1.2 oz (77.6 kg)  10/18/14 162 lb (73.483 kg)  09/07/14 160 lb (72.576 kg)  04/26/14 168 lb 14 oz (76.6 kg)  04/23/14 172 lb 6.4 oz (78.2 kg)  12/29/13 179 lb (81.194 kg)  12/14/13 175 lb (79.379 kg)  12/13/13 172 lb  (78.019 kg)  11/11/13 172 lb (78.019 kg)  10/21/13 176 lb 5.9 oz (80 kg)  Admit weight: 165 lbs (75 kg)  Ideal Body Weight:  56.8 kg  BMI:  Body mass index using admit weight/normal height is 27.5 kg/(m^2).  Estimated Nutritional Needs:  Kcal:  1483 kcals Protein:  85-114 (1.5-2 g/kg IBW) Fluid:  1.5 liters  EDUCATION NEEDS:  No education needs identified at this time  Burtis Junes RD, LDN Nutrition Pager: [phone] 11/15/2014 1:24 PM

## 2014-11-15 NOTE — Care Management Note (Signed)
Case Management Note  Patient Details  Name: [name] [name] MRN: [id number] Date of Birth: 02-Oct-1940   Expected Discharge Date:  11/15/14               Expected Discharge Plan:     In-House Referral:  NA  Discharge planning Services  CM Consult  Post Acute Care Choice:    Choice offered to:     DME Arranged:    DME Agency:     HH Arranged:    HH Agency:     Status of Service:  In process, will continue to follow  Medicare Important Message Given:    Date Medicare IM Given:    Medicare IM give by:    Date Additional Medicare IM Given:    Additional Medicare Important Message give by:     If discussed at [place] of Stay Meetings, dates discussed:    Additional Comments: Pt admitted with HCAP. Pt from home with family and ind at baseline. Pt intubated on 11/14/2014 and is currently sedated and unable to complete assessment. Family in room, will complete assessment after extubated and pt able to communicate wishes regarding DC plan. Pt will need PT eval prior when appropriate.  [name], RN 11/15/2014, 3:56 PM

## 2014-11-15 NOTE — Progress Notes (Signed)
[facility] PROGRESS NOTE  [name] [name] TZG:[id number] DOB: 24-Mar-1940 DOA: 11/14/2014 PCP: Sherrie Mustache, MD    Code Status: Full code Family Communication: Discussed with husband. Disposition Plan: Discharge when clinically appropriate   Consultants:  Pulmonologist, Dr. Luan Pulling  Procedures:  Mechanical intubation 9/19   Right upper extremity PICC 9/19    BiPAP  Antibiotics:  Zosyn 9/17>>  Vancomycin 9/17>>  HPI/Subjective: Patient is sedated on the ventilator. No significant findings overnight per nursing.  Objective: Filed Vitals:   11/15/14 0812  BP:   Pulse:   Temp: 99.3 F (37.4 C)  Resp:    T 99.3 HR 101. RR 17. BP 122/65. 02 sat on the vent 96%.   Intake/Output Summary (Last 24 hours) at 11/15/14 0842 Last data filed at 11/15/14 0600  Gross per 24 hour  Intake 3866.57 ml  Output   2350 ml  Net 1516.57 ml   Filed Weights   11/14/2014 2321 11/15/14 0456  Weight: 74.8 kg (164 lb 14.5 oz) 77.6 kg (171 lb 1.2 oz)    Exam:   General:  74 year old woman sedated, on the ventilator.  Cardiovascular: S1, S2, with occasional ectopic beat and borderline tachycardia.  Respiratory: Patient is on the ventilator. There are a few basilar crackles.  Abdomen: Positive bowel sounds, soft, mildly tender as the patient grimaces with deep palpation of her hypogastrium.  Musculoskeletal:/Extremities: No pedal edema.  Neurologic: She is sedated on the ventilator, but grimaces and moves her extremities slightly with provocation.   Data Reviewed: Basic Metabolic Panel:  Recent Labs Lab 11/09/2014 1903 11/12/14 0431 11/13/14 0442 11/14/14 0834 11/15/14 0415  NA 137 139 141 140 141  K 3.8 3.7 3.5 5.1 3.7  CL 92* 93* 93* 95* 97*  CO2 36* 39* 40* 38* 36*  GLUCOSE 128* 139* 147* 235* 112*  BUN 9 9 9 14 15   CREATININE 0.62 0.62 0.49 0.65 0.62  CALCIUM 8.6* 8.4* 8.4* 8.6* 7.8*   Liver Function Tests:  Recent Labs Lab 11/10/2014 1903  11/15/14 0415  AST 18 64*  ALT 11* 40  ALKPHOS 73 55  BILITOT 0.6 0.8  PROT 6.3* 5.5*  ALBUMIN 3.3* 2.8*   No results for input(s): LIPASE, AMYLASE in the last 168 hours. No results for input(s): AMMONIA in the last 168 hours. CBC:  Recent Labs Lab 11/05/2014 1903 11/12/14 0431 11/13/14 0442 11/14/14 0834 11/15/14 0415  WBC 14.2* 11.2* 8.9 22.4* 15.3*  NEUTROABS 11.6*  --   --   --   --   HGB 10.7* 10.1* 9.1* 11.3* 10.1*  HCT 37.3 34.9* 31.2* 40.3 34.7*  MCV 94.0 94.3 93.4 96.4 92.3  PLT 303 253 229 454* 403*   Cardiac Enzymes:  Recent Labs Lab 11/14/14 0833 11/14/14 1425 11/14/14 2000  TROPONINI <0.03 0.05* 0.07*   BNP (last 3 results)  Recent Labs  04/15/14 1423 04/26/14 1358 11/23/2014 1903  BNP 229.9* 294.0* 549.0*    ProBNP (last 3 results)  Recent Labs  12/29/13 1238 04/15/14 1417 10/18/14 1456  PROBNP 195.0* 230.0* 288.0*    CBG:  Recent Labs Lab 11/14/14 1025 11/14/14 1153 11/14/14 1709 11/14/14 2143 11/15/14 0731  GLUCAP 237* 194* 79 100* 92    Recent Results (from the past 240 hour(s))  MRSA PCR Screening     Status: None   Collection Time: 10/29/2014 11:59 PM  Result Value Ref Range Status   MRSA by PCR NEGATIVE NEGATIVE Final    Comment:        The  GeneXpert MRSA Assay (FDA approved for NASAL specimens only), is one component of a comprehensive MRSA colonization surveillance program. It is not intended to diagnose MRSA infection nor to guide or monitor treatment for MRSA infections.      Studies: Portable Chest Xray  11/15/2014   CLINICAL DATA:  On a ventilator.  EXAM: PORTABLE CHEST - 1 VIEW  COMPARISON:  Yesterday.  FINDINGS: Endotracheal tube in satisfactory position. Nasogastric tube extending into the stomach. Right PICC tip poorly visualized in the region of the superior cavoatrial junction. Decreased bibasilar airspace opacity. Mildly decreased bilateral pleural fluid. The pulmonary vasculature and interstitial  markings remain prominent, specially on the right. The cardiac silhouette remains enlarged.  IMPRESSION: 1. Decreased bibasilar atelectasis. 2. Mildly decreased bilateral pleural fluid. 3. Stable cardiomegaly, pulmonary vascular congestion and mild right lung interstitial edema.   Electronically Signed   By: Claudie Revering M.D.   On: 11/15/2014 08:22   Dg Chest Port 1 View  11/14/2014   CLINICAL DATA:  Pneumonia, ET tube placement.  EXAM: PORTABLE CHEST - 1 VIEW  COMPARISON:  Chest x-ray dated 09/16/ 2016 and chest x-ray dated 09/07/2014.  FINDINGS: Interval placement of an endotracheal tube with tip well positioned above the level of the carina. Enteric tube passes below the diaphragm.  Cardiomegaly appears stable. Lungs again appear hyperinflated suggesting COPD. Left lung base remains difficult to characterize due to the enlarged heart size.  Atelectasis versus scarring within the right mid lung region. There is slightly improved aeration at the right lung base. No new lung findings seen. No evidence of a large pleural effusion. No pneumothorax.  IMPRESSION: 1. Slightly improved aeration at the right lung base suggesting either resolving pneumonia or decreased atelectasis or edema. No new lung findings.  2. Cardiomegaly, unchanged.  3. Probable COPD.  4. Endotracheal tube well positioned with tip approximately 4 cm above the level of the carina.   Electronically Signed   By: Franki Cabot M.D.   On: 11/14/2014 09:12   Dg Chest Port 1v Same Day  11/14/2014   CLINICAL DATA:  Right side PICC line placement.  EXAM: PORTABLE CHEST - 1 VIEW SAME DAY  COMPARISON:  11/14/2014  FINDINGS: Right PICC line is in place with the tip in the SVC. Endotracheal tube and NG tube are unchanged. There is cardiomegaly. Bilateral lower lobe airspace opacities with small effusions. Mild vascular congestion.  IMPRESSION: Right PICC line tip in the SVC.  Continued cardiomegaly with vascular congestion.  Small effusions with bibasilar  atelectasis or infiltrates.   Electronically Signed   By: Rolm Baptise M.D.   On: 11/14/2014 11:45    Scheduled Meds: . antiseptic oral rinse  7 mL Mouth Rinse BID  . antiseptic oral rinse  7 mL Mouth Rinse QID  . aspirin  300 mg Rectal Daily  . budesonide (PULMICORT) nebulizer solution  0.25 mg Nebulization BID  . chlorhexidine gluconate  15 mL Mouth Rinse BID  . cyanocobalamin  1,000 mcg Intramuscular Daily  . enoxaparin (LOVENOX) injection  40 mg Subcutaneous Q24H  . insulin aspart  0-15 Units Subcutaneous TID WC  . insulin aspart  0-5 Units Subcutaneous QHS  . insulin glargine  7 Units Subcutaneous QHS  . latanoprost  1 drop Both Eyes QHS  . levalbuterol  0.63 mg Nebulization Q6H  . methylPREDNISolone (SOLU-MEDROL) injection  80 mg Intravenous Q6H  . pantoprazole (PROTONIX) IV  40 mg Intravenous Daily  . piperacillin-tazobactam (ZOSYN)  IV  3.375 g Intravenous Q8H  .  sodium chloride  10-40 mL Intracatheter Q12H  . sodium chloride  3 mL Intravenous Q12H  . vancomycin  750 mg Intravenous Q12H   Continuous Infusions: . 0.9 % NaCl with KCl 20 mEq / L 125 mL/hr at 11/15/14 0500  . amiodarone 30 mg/hr (11/14/14 2137)  . phenylephrine (NEO-SYNEPHRINE) Adult infusion 20 mcg/min (11/15/14 0500)    Assessment and plan:  Principal Problem:   HCAP (healthcare-associated pneumonia) Active Problems:   Acute exacerbation of chronic obstructive pulmonary disease (COPD)   Acute on chronic respiratory failure with hypercapnia   Chronic combined systolic and diastolic congestive heart failure   Ischemic cardiomyopathy   Tachyarrhythmia   Essential hypertension   Hyperglycemia, drug-induced   Obesity   Hypotension   Anemia, iron deficiency   Vitamin B12 deficiency    1. Acute encephalopathy with worsening pulmonary status-acute on chronic respiratory failure now requiring intubation for airway protection. On morning of 9/19 patient complained of shortness of breath. Her heart rate  was in the 140s secondary to A. fib/A flutter. She became obtunded and subsequently unresponsive. Her oxygen saturation fell to the 80s on 4 L of nasal cannula oxygen. She became hypotensive which was thought to be secondary to IV diltiazem. Dr. Luan Pulling was consulted. She was eventually intubated and started on a Neo-Synephrine drip for hypotension. She was continued on antibiotics, steroid, and nebulizers. -Her follow-up chest x-ray after intubation did not reveal worsening infiltrates or edema. ABG following intubation on 11/14/14 revealed a pH of 7.3 and a PCO2 of 60. -The change in her respiratory status and mental status are unclear as the patient had been improving clinically. She had been given clonazepam shortly before she decompensated, but this is a chronic medication for her. -For further evaluation, we'll order a CT scan of the head, abdominal ultrasound due to her history of AAA, 2-D echocardiogram given her history of combined heart failure, and lower extremity venous ultrasound rule out DVT. -Further details below.  Hypotension. The patient became hypotensive following intubation and IV diltiazem. I do not believe the hypotension was from sepsis, but likely from oral diltiazem superimposed on IV diltiazem given to try to decrease her heart rate. She was also likely hypotensive from intubation itself. -She was started on Neo-Synephrine with improvement. She was given a bolus of IV fluids. -We'll continue current management for now with the intent to decrease Neo-Synephrine as her blood pressure tolerates it.   Healthcare acquired pneumonia. On admission, the patient's chest x-ray revealed right mid to lower lung pneumonia/pneumonitis. Her white blood cell count was elevated at 14.2. She was afebrile on admission. She was started on Zosyn and vancomycin. Her white blood cell count has increased secondary to IV steroids. -Her follow-up chest x-rays reveal vascular congestion and decrease  bibasilar infiltrates, so there does not appear to be any worsening of her pneumonia radiographically. -We'll continue current management. I  Acute COPD exacerbation causing acute on chronic respiratory failure with hypercapnia. Patient is treated chronically with 4-5 L of oxygen 24/7. Patient's ABG on admission revealed pH of 7.35, PCO2 of 64, and PO2 of 108. She was started on BiPAP and her oxygen was titrated accordingly. IV Solu-Medrol and duo nebulizers were started. Symbicort was restarted. -Stated above, the patient decompensated and had to be intubated. Follow-up ABG following intubation on 9/19 revealed a pH of 7.3 and a PCO2 of 60 and a PO2 of 273. -Albuterol was discontinued due to tachyarrhythmias. She was subsequently started on Xopenex. Solu-Medrol was increased to  80 mg every 6 hours. -Further adjustments in ventilator settings as recommended by Dr. Luan Pulling. The patient's CO2 and pH have improved, but her PO2 has decreased. FiO2 will need to be increased.  History of tachyarrhythmias/atrial flutter Per review of the patient's chart, she has a history of V. Tach, PAF and SVT. Her EKG on admission revealed atrial flutter. We'll was continued amiodarone and diltiazem for treatment. When she became more tachycardic on 9/19, she was given 10 mg of IV diltiazem. It was unsuccessful. Following intubation, she was started on an amiodarone drip after I briefly discussed this with Dr. Harrington Challenger. Her troponin I is slightly elevated, likely from demand ischemia and from rapid atrial flutter/fib. -Her TSH was within normal limits. -We'll order a 2-D echocardiogram for further evaluation. -Her heart rate has improved on the amiodarone drip.  Ischemic cardiomyopathy with chronic combined heart failure. Echocardiogram February 2016 revealed an EF of 40-45% and grade 1 diastolic dysfunction. Patient's follow-up chest x-rays reveal minimal right lung interstitial edema and small bilateral pleural  effusions; therefore the patient may have mild acute on chronic combined heart failure. -On admission, she was continued on Lasix, Plavix, isosorbide mononitrate, and pravastatin-there being held due to intubation. -We'll consider restarting small doses of Lasix when her blood pressure has improved off of pressors. I do not believe she has significant pulmonary edema at this point. We'll continue gentle IV fluids for volume support.  Hyperglycemia, likely steroid-induced. Sliding scale NovoLog and Lantus were started. Her CBGs are trending up on sliding scale NovoLog. We'll therefore change her sliding scale NovoLog to every 4 hours and increase Lantus.  Normocytic anemia; iron deficiency; vitamin B12 deficiency.  Patient's hemoglobin ranges from 10.0-11.0.  Iron studies revealed a low ferritin of 10, low iron of 10, TIBC of 357, and low vitamin B12 of 178. Total iron, ferritin, and vitamin B12 ordered and are pending. Her TSH was within normal limits. -Vitamin B12 injections daily 3 days ordered. We'll hold off on iron infusion and packed red blood cells as her hemoglobin is stable.  History of AAA. Patient has a history of abdominal aortic aneurysm. Will order an abdominal ultrasound for assessment in the setting of acute respiratory failure and hypotension.  DVT prophylaxis with Lovenox. GI prophylaxis with Protonix.     Time spent: 45 minutes of critical care time.    [facility] Pager [phone]. If 7PM-7AM, please contact night-coverage at [email or web address], password [facility] 11/15/2014, 8:42 AM  LOS: 4 days

## 2014-11-15 NOTE — Progress Notes (Signed)
ANTIBIOTIC CONSULT Note  Pharmacy Consult for vancomycin/zosyn Indication: pneumonia  Allergies  Allergen Reactions  . Albuterol Other (See Comments)    Jittery/shakiness  . Levaquin [Levofloxacin In D5w] Nausea Only  . Sulfa Antibiotics Nausea Only    Patient Measurements: Height: 5\' 3"  (160 cm) Weight: 171 lb 1.2 oz (77.6 kg) IBW/kg (Calculated) : 52.4   Vital Signs: Temp: 98.9 F (37.2 C) (09/20 1138) Temp Source: Axillary (09/20 1138) BP: 122/65 mmHg (09/20 0600) Pulse Rate: 101 (09/20 0600)  Labs:  Recent Labs  11/13/14 0442 11/14/14 0834 11/15/14 0415  WBC 8.9 22.4* 15.3*  HGB 9.1* 11.3* 10.1*  PLT 229 454* 403*  CREATININE 0.49 0.65 0.62    Estimated Creatinine Clearance: 61.8 mL/min (by C-G formula based on Cr of 0.62).   Recent Labs  11/15/14 1227  [place]     Microbiology: Recent Results (from the past 720 hour(s))  MRSA PCR Screening     Status: None   Collection Time: 11/19/2014 11:59 PM  Result Value Ref Range Status   MRSA by PCR NEGATIVE NEGATIVE Final    Comment:        The GeneXpert MRSA Assay (FDA approved for NASAL specimens only), is one component of a comprehensive MRSA colonization surveillance program. It is not intended to diagnose MRSA infection nor to guide or monitor treatment for MRSA infections.     Medical History: Past Medical History  Diagnosis Date  . Hypertension   . Hyperlipidemia   . Cerebrovascular disease, unspecified   . Unspecified glaucoma   . Obesity, unspecified   . Coronary artery disease     cath 04/21/14:severe multivessel disease with a left dominant coronary system, chronic occlusion of the LAD, and heavily calcified severe stenosis of the proximal left circumflex.  She underwent rotational atherectomy and stenting of the left circumflex  . Ventricular tachycardia   . SVT (supraventricular tachycardia)   . AAA (abdominal aortic aneurysm)   . COPD (chronic obstructive pulmonary disease)  2013    [place] of breath     WITH ACTIVITY--USES OXYGEN AS NEEDED  2&1/2 L PER NASAL CANNUL  . Complication of anesthesia     PT STATES SHE CAN NOT BE PUT TO SLEEP BECAUSE OF HER LUNG PROBLEMS  . Carotid artery occlusion   . Anemia   . CHF (congestive heart failure)   . Abdominal aneurysm     "hasn't been repaired" (10/20/2013)  . Pneumonia X ~ 2  . On home oxygen therapy     "4L; 24/7" (10/20/2013)  . GERD (gastroesophageal reflux disease)   . Migraines     "before tubal ligation I had them alot; seldom have one anymore" (10/20/2013)  . Arthritis     "knees mainly" (10/20/2013)  . Bladder cancer 2009  . Atrial fibrillation   . Bell palsy 2014    Medications:  Scheduled:  . antiseptic oral rinse  7 mL Mouth Rinse BID  . antiseptic oral rinse  7 mL Mouth Rinse QID  . aspirin  300 mg Rectal Daily  . budesonide (PULMICORT) nebulizer solution  0.25 mg Nebulization BID  . chlorhexidine gluconate  15 mL Mouth Rinse BID  . cyanocobalamin  1,000 mcg Intramuscular Daily  . enoxaparin (LOVENOX) injection  40 mg Subcutaneous Q24H  . insulin aspart  0-15 Units Subcutaneous 6 times per day  . insulin glargine  10 Units Subcutaneous BID  . latanoprost  1 drop Both Eyes QHS  . levalbuterol  0.63  mg Nebulization Q6H  . methylPREDNISolone (SOLU-MEDROL) injection  80 mg Intravenous Q6H  . pantoprazole (PROTONIX) IV  40 mg Intravenous Daily  . piperacillin-tazobactam (ZOSYN)  IV  3.375 g Intravenous Q8H  . sodium chloride  10-40 mL Intracatheter Q12H  . vancomycin  750 mg Intravenous Q12H    Assessment: 74 yo female with hx of severe COPD admitted for HCAP and COPD exacerbation. Acute encephalopathy with worsening pulmonary status, acute on chronic respiratory failure with hypercapnia .Patient sedated and on ventilator.  Vancomycin trough reported as 17 mcg/ml. Patient therapeutic  Goal of Therapy:  Vancomycin trough level 15-20 mcg/ml  Plan: ContinueZosyn 3.375 gm IV  q8 hours and vancomycin 750 mg IV q12 hours F/u renal function, cultures and clinical course  Isac Sarna, BS [place], BCPS Clinical Pharmacist Pager [phone]  11/15/2014,2:26 PM

## 2014-11-16 ENCOUNTER — Inpatient Hospital Stay (HOSPITAL_COMMUNITY): Payer: Medicare Other

## 2014-11-16 DIAGNOSIS — I714 Abdominal aortic aneurysm, without rupture, unspecified: Secondary | ICD-10-CM | POA: Diagnosis present

## 2014-11-16 DIAGNOSIS — E538 Deficiency of other specified B group vitamins: Secondary | ICD-10-CM

## 2014-11-16 LAB — CBC
HCT: 31.3 % — ABNORMAL LOW (ref 36.0–46.0)
Hemoglobin: 9.5 g/dL — ABNORMAL LOW (ref 12.0–15.0)
MCH: 27.5 pg (ref 26.0–34.0)
MCHC: 30.4 g/dL (ref 30.0–36.0)
MCV: 90.5 fL (ref 78.0–100.0)
PLATELETS: 224 10*3/uL (ref 150–400)
RBC: 3.46 MIL/uL — AB (ref 3.87–5.11)
RDW: 16.2 % — ABNORMAL HIGH (ref 11.5–15.5)
WBC: 6 10*3/uL (ref 4.0–10.5)

## 2014-11-16 LAB — BLOOD GAS, ARTERIAL
ACID-BASE EXCESS: 6 mmol/L — AB (ref 0.0–2.0)
Acid-Base Excess: 3.8 mmol/L — ABNORMAL HIGH (ref 0.0–2.0)
BICARBONATE: 26.2 meq/L — AB (ref 20.0–24.0)
Bicarbonate: 29.6 mEq/L — ABNORMAL HIGH (ref 20.0–24.0)
DRAWN BY: 213101
DRAWN BY: 25788
FIO2: 40
FIO2: 40
O2 SAT: 94.2 %
O2 Saturation: 86 %
PCO2 ART: 46.8 mmHg — AB (ref 35.0–45.0)
PEEP: 5 cmH2O
PEEP: 5 cmH2O
PH ART: 7.428 (ref 7.350–7.450)
PO2 ART: 81.8 mmHg (ref 80.0–100.0)
PRESSURE SUPPORT: 5 cmH2O
RATE: 16 resp/min
TCO2: 12.1 mmol/L (ref 0–100)
VT: 420 mL
pCO2 arterial: 72.4 mmHg (ref 35.0–45.0)
pH, Arterial: 7.248 — ABNORMAL LOW (ref 7.350–7.450)
pO2, Arterial: 69.2 mmHg — ABNORMAL LOW (ref 80.0–100.0)

## 2014-11-16 LAB — GLUCOSE, CAPILLARY
GLUCOSE-CAPILLARY: 104 mg/dL — AB (ref 65–99)
GLUCOSE-CAPILLARY: 108 mg/dL — AB (ref 65–99)
GLUCOSE-CAPILLARY: 109 mg/dL — AB (ref 65–99)
GLUCOSE-CAPILLARY: 122 mg/dL — AB (ref 65–99)
GLUCOSE-CAPILLARY: 125 mg/dL — AB (ref 65–99)
Glucose-Capillary: 106 mg/dL — ABNORMAL HIGH (ref 65–99)
Glucose-Capillary: 116 mg/dL — ABNORMAL HIGH (ref 65–99)

## 2014-11-16 LAB — BASIC METABOLIC PANEL
Anion gap: 6 (ref 5–15)
BUN: 18 mg/dL (ref 6–20)
CALCIUM: 7.8 mg/dL — AB (ref 8.9–10.3)
CO2: 32 mmol/L (ref 22–32)
CREATININE: 0.54 mg/dL (ref 0.44–1.00)
Chloride: 102 mmol/L (ref 101–111)
Glucose, Bld: 109 mg/dL — ABNORMAL HIGH (ref 65–99)
Potassium: 3.5 mmol/L (ref 3.5–5.1)
SODIUM: 140 mmol/L (ref 135–145)

## 2014-11-16 LAB — BRAIN NATRIURETIC PEPTIDE: B NATRIURETIC PEPTIDE 5: 1064 pg/mL — AB (ref 0.0–100.0)

## 2014-11-16 MED ORDER — INSULIN GLARGINE 100 UNIT/ML ~~LOC~~ SOLN
7.0000 [IU] | Freq: Two times a day (BID) | SUBCUTANEOUS | Status: DC
  Administered 2014-11-16 – 2014-11-22 (×11): 7 [IU] via SUBCUTANEOUS
  Filled 2014-11-16 (×14): qty 0.07

## 2014-11-16 MED ORDER — FUROSEMIDE 10 MG/ML IJ SOLN
10.0000 mg | Freq: Two times a day (BID) | INTRAMUSCULAR | Status: DC
  Administered 2014-11-16 (×2): 10 mg via INTRAVENOUS
  Filled 2014-11-16 (×2): qty 2

## 2014-11-16 MED ORDER — POTASSIUM CHLORIDE 10 MEQ/50ML IV SOLN
10.0000 meq | INTRAVENOUS | Status: DC
  Filled 2014-11-16 (×2): qty 50

## 2014-11-16 MED ORDER — FENTANYL CITRATE (PF) 2500 MCG/50ML IJ SOLN
INTRAMUSCULAR | Status: AC
  Filled 2014-11-16: qty 50

## 2014-11-16 MED ORDER — POTASSIUM CHLORIDE 10 MEQ/100ML IV SOLN
10.0000 meq | INTRAVENOUS | Status: AC
  Administered 2014-11-16 (×2): 10 meq via INTRAVENOUS
  Filled 2014-11-16 (×2): qty 100

## 2014-11-16 NOTE — Progress Notes (Signed)
CRITICAL VALUE ALERT  Critical value received:  pCo2 72.4 Date of notification: 11/16/2014  Time of notification:  9:00 AM   Critical value read back:Yes.    Nurse who received alert:  l [email or web address]  MD notified (1st page):  hawkins  Time of first page:  9:01 AM  MD notified (2nd page):  Time of second page:  Responding MD: Luan Pulling Time MD responded:  9:01 AM

## 2014-11-16 NOTE — Care Management Note (Signed)
Case Management Note  Patient Details  Name: [name] [name] MRN: [id number] Date of Birth: 1940/06/21  Expected Discharge Date:  11/15/14               Expected Discharge Plan:  [place]  In-House Referral:  NA  Discharge planning Services  CM Consult  Post Acute Care Choice:    Choice offered to:     DME Arranged:    DME Agency:     HH Arranged:    HH Agency:     Status of Service:  In process, will continue to follow  Medicare Important Message Given:    Date Medicare IM Given:    Medicare IM give by:    Date Additional Medicare IM Given:    Additional Medicare Important Message give by:     If discussed at [place] of Stay Meetings, dates discussed:    Additional Comments: Pt alert and able to communicate through witting despite continued intubation. Pt's husband in room. Pt is from home with [facility] through [facility] but was recently discharged from [facility] SNF. Where pt spent 20 days. Pt was previously able to walk with a walker, not she mostly uses wheelchair to move around home. Pt has a BSC that she uses and has a lady friend who comes once or twice a week to help with a bath. Pt's husband is ind and helps prepare meals and care for pt. Pt has home O2, neb machine and bipap (or CPAP husband isn't sure which) prior to admission. Pt is receiving RN and PT services through [facility] who has been notified of admission. Pt's husband said they would be agreeable to her going to SNF if necessary after DC. If she returns home they would like [facility] services resumed through [facility]. CM will cont to follow for DC planning.  [name], RN 11/16/2014, 2:33 PM

## 2014-11-16 NOTE — Progress Notes (Signed)
[facility] PROGRESS NOTE  SIEARRA [name] BHA:[id number] DOB: Jul 11, 1940 DOA: 11/08/2014 PCP: Sherrie Mustache, MD    Code Status: Full code Family Communication: Discussed with husband. Disposition Plan: Discharge when clinically appropriate   Consultants:  Pulmonologist, Dr. Luan Pulling  Procedures:  2-D echocardiogram 11/15/14: Study Conclusions - Left ventricle: The cavity size was normal. Wall thickness was increased in a pattern of mild LVH. Systolic function was moderately to severely reduced. The estimated ejection fraction was in the range of 30% to 35%. The study was not technically sufficient to allow evaluation of LV diastolic dysfunction due to atrial fibrillation. - Aortic valve: Mildly to moderately calcified annulus. Mildly thickened leaflets. Valve area (VTI): 2.06 cm^2. Valve area (Vmax): 1.86 cm^2. - Mitral valve: Mildly calcified annulus. Mildly thickened leaflets . There was mild to moderate regurgitation. The MR vena contracta is 0.4 cm. - Left atrium: The atrium was moderately dilated. - Right ventricle: Systolic function was mildly to moderately reduced. - Right atrium: The atrium was moderately dilated. - Pulmonary arteries: Systolic pressure was moderately increased. PA peak pressure: 45 mm Hg (S). - Inferior vena cava: The vessel was dilated. The respirophasic diameter changes were blunted (< 50%), consistent with elevated central venous pressure. - Technically difficult study.  Mechanical intubation 9/19   Right upper extremity PICC 9/19    BiPAP  Antibiotics:  Zosyn 9/17>>  Vancomycin 9/17>>  HPI/Subjective: Patient has been placed on the weaning protocol. She is currently alert and nods her head no to shortness of breath or pain. Her eyes open and provides good eye contact.   Objective: Filed Vitals:   11/16/14 0843  BP:   Pulse:   Temp: 97.4 F (36.3 C)  Resp:    temperature 97.4. Pulse 68.  Respiratory rate 16. Blood pressure 111/77. Oxygen saturation 99% (on the ventilator).   Intake/Output Summary (Last 24 hours) at 11/16/14 0914 Last data filed at 11/16/14 0600  Gross per 24 hour  Intake 2990.09 ml  Output    500 ml  Net 2490.09 ml   Filed Weights   11/02/2014 2321 11/15/14 0456 11/16/14 0500  Weight: 74.8 kg (164 lb 14.5 oz) 77.6 kg (171 lb 1.2 oz) 79.7 kg (175 lb 11.3 oz)    Exam:   General:  74 year old woman on the ventilator, but is alert on the weaning protocol.  Cardiovascular: S1, S2, with occasional ectopic beat and borderline tachycardia.  Respiratory: Patient is on the ventilator. There are a few basilar rhonchi.  Abdomen: Positive bowel sounds, soft, mildly tender as the patient grimaces with deep palpation of her hypogastrium.  Musculoskeletal:/Extremities: No pedal edema.  Neurologic: She is more alert on the ventilator; on weaning protocol. She nods appropriately. She squeezes the examiner's hand bilaterally with both hands with moderate strength.   Data Reviewed: Basic Metabolic Panel:  Recent Labs Lab 11/12/14 0431 11/13/14 0442 11/14/14 0834 11/15/14 0415 11/16/14 0415  NA 139 141 140 141 140  K 3.7 3.5 5.1 3.7 3.5  CL 93* 93* 95* 97* 102  CO2 39* 40* 38* 36* 32  GLUCOSE 139* 147* 235* 112* 109*  BUN 9 9 14 15 18   CREATININE 0.62 0.49 0.65 0.62 0.54  CALCIUM 8.4* 8.4* 8.6* 7.8* 7.8*   Liver Function Tests:  Recent Labs Lab 11/15/2014 1903 11/15/14 0415  AST 18 64*  ALT 11* 40  ALKPHOS 73 55  BILITOT 0.6 0.8  PROT 6.3* 5.5*  ALBUMIN 3.3* 2.8*   No results for input(s): LIPASE, AMYLASE in the  last 168 hours. No results for input(s): AMMONIA in the last 168 hours. CBC:  Recent Labs Lab 11/25/2014 1903 11/12/14 0431 11/13/14 0442 11/14/14 0834 11/15/14 0415 11/16/14 0415  WBC 14.2* 11.2* 8.9 22.4* 15.3* 6.0  NEUTROABS 11.6*  --   --   --   --   --   HGB 10.7* 10.1* 9.1* 11.3* 10.1* 9.5*  HCT 37.3 34.9* 31.2* 40.3  34.7* 31.3*  MCV 94.0 94.3 93.4 96.4 92.3 90.5  PLT 303 253 229 454* 403* 224   Cardiac Enzymes:  Recent Labs Lab 11/14/14 0833 11/14/14 1425 11/14/14 2000  TROPONINI <0.03 0.05* 0.07*   BNP (last 3 results)  Recent Labs  04/15/14 1423 04/26/14 1358 11/23/2014 1903  BNP 229.9* 294.0* 549.0*    ProBNP (last 3 results)  Recent Labs  12/29/13 1238 04/15/14 1417 10/18/14 1456  PROBNP 195.0* 230.0* 288.0*    CBG:  Recent Labs Lab 11/15/14 1703 11/15/14 2036 11/16/14 0029 11/16/14 0405 11/16/14 0754  GLUCAP 118* 120* 122* 109* 104*    Recent Results (from the past 240 hour(s))  MRSA PCR Screening     Status: None   Collection Time: 11/04/2014 11:59 PM  Result Value Ref Range Status   MRSA by PCR NEGATIVE NEGATIVE Final    Comment:        The GeneXpert MRSA Assay (FDA approved for NASAL specimens only), is one component of a comprehensive MRSA colonization surveillance program. It is not intended to diagnose MRSA infection nor to guide or monitor treatment for MRSA infections.      Studies: Ct Head Wo Contrast  11/15/2014   CLINICAL DATA:  Acute encephalopathy.  Ventilated patient.  EXAM: CT HEAD WITHOUT CONTRAST  TECHNIQUE: Contiguous axial images were obtained from the base of the skull through the vertex without intravenous contrast.  COMPARISON:  None.  FINDINGS: No mass effect or midline shift. No evidence of acute intracranial hemorrhage, or infarction. No abnormal extra-axial fluid collections. Gray-white matter differentiation is normal. Basal cisterns are preserved. There is atrophy and chronic small vessel disease changes.  No depressed skull fractures. Visualized paranasal sinuses and mastoid air cells are not opacified. There is a partially visualized catheter within the left nasal cavity.  IMPRESSION: No acute intracranial abnormality.  Atrophy, chronic microvascular disease.   Electronically Signed   By: Fidela Salisbury M.D.   On: 11/15/2014  10:46   US Abdomen Complete  11/15/2014   CLINICAL DATA:  Known abdominal aortic aneurysm  EXAM: ULTRASOUND ABDOMEN COMPLETE  COMPARISON:  None.  FINDINGS: Gallbladder: No gallstones or wall thickening visualized. No sonographic Murphy sign noted.  Common bile duct: Diameter: 3.2 mm.  Liver: Increased echogenicity consistent with fatty infiltration.  IVC: No abnormality visualized.  Pancreas: Not well visualized  Spleen: Size and appearance within normal limits.  Right Kidney: Length: 11.4 cm. Echogenicity within normal limits. No mass or hydronephrosis visualized.  Left Kidney: Length: 11.3 cm. Echogenicity within normal limits. No mass or hydronephrosis visualized.  Abdominal aorta: There is aneurysmal dilatation of the mid to distal aorta to 5.1 cm. This has increased in size from 2011 at which time it measured 3.5 cm.  Other findings: None.  IMPRESSION: Increase in size in of abdominal aorta to 5.1 cm. Recommend followup by abdomen and pelvis CTA in 3-6 months, and vascular surgery referral/consultation if not already obtained. This recommendation follows ACR consensus guidelines: White Paper of the ACR Incidental Findings Committee II on Vascular Findings. J Am Coll Radiol 2013; 10:789-794.  Fatty liver.  No other focal abnormality is noted.   Electronically Signed   By: Inez Catalina M.D.   On: 11/15/2014 09:52   US Venous Img Lower Bilateral  11/15/2014   CLINICAL DATA:  Respiratory failure.  Evaluate for DVT.  EXAM: BILATERAL LOWER EXTREMITY VENOUS DOPPLER ULTRASOUND  TECHNIQUE: Gray-scale sonography with graded compression, as well as color Doppler and duplex ultrasound were performed to evaluate the lower extremity deep venous systems from the level of the common femoral vein and including the common femoral, femoral, profunda femoral, popliteal and calf veins including the posterior tibial, peroneal and gastrocnemius veins when visible. The superficial great saphenous vein was also interrogated.  Spectral Doppler was utilized to evaluate flow at rest and with distal augmentation maneuvers in the common femoral, femoral and popliteal veins.  COMPARISON:  None.  FINDINGS: RIGHT LOWER EXTREMITY  Common Femoral Vein: No evidence of thrombus. Normal compressibility, respiratory phasicity and response to augmentation.  Saphenofemoral Junction: No evidence of thrombus. Normal compressibility and flow on color Doppler imaging.  Profunda Femoral Vein: No evidence of thrombus. Normal compressibility and flow on color Doppler imaging.  Femoral Vein: No evidence of thrombus. Normal compressibility, respiratory phasicity and response to augmentation.  Popliteal Vein: No evidence of thrombus. Normal compressibility, respiratory phasicity and response to augmentation.  Calf Veins: No evidence of thrombus. Normal compressibility and flow on color Doppler imaging.  Superficial Great Saphenous Vein: No evidence of thrombus. Normal compressibility and flow on color Doppler imaging.  Venous Reflux:  None.  Other Findings:  None.  LEFT LOWER EXTREMITY  Common Femoral Vein: No evidence of thrombus. Normal compressibility, respiratory phasicity and response to augmentation.  Saphenofemoral Junction: No evidence of thrombus. Normal compressibility and flow on color Doppler imaging.  Profunda Femoral Vein: No evidence of thrombus. Normal compressibility and flow on color Doppler imaging.  Femoral Vein: No evidence of thrombus. Normal compressibility, respiratory phasicity and response to augmentation.  Popliteal Vein: No evidence of thrombus. Normal compressibility, respiratory phasicity and response to augmentation.  Calf Veins: No evidence of thrombus. Normal compressibility and flow on color Doppler imaging.  Superficial Great Saphenous Vein: No evidence of thrombus. Normal compressibility and flow on color Doppler imaging.  Venous Reflux:  None.  Other Findings:  None.  IMPRESSION: No evidence of DVT within either lower  extremity.   Electronically Signed   By: Sandi Mariscal M.D.   On: 11/15/2014 09:27   Portable Chest Xray  11/15/2014   CLINICAL DATA:  On a ventilator.  EXAM: PORTABLE CHEST - 1 VIEW  COMPARISON:  Yesterday.  FINDINGS: Endotracheal tube in satisfactory position. Nasogastric tube extending into the stomach. Right PICC tip poorly visualized in the region of the superior cavoatrial junction. Decreased bibasilar airspace opacity. Mildly decreased bilateral pleural fluid. The pulmonary vasculature and interstitial markings remain prominent, specially on the right. The cardiac silhouette remains enlarged.  IMPRESSION: 1. Decreased bibasilar atelectasis. 2. Mildly decreased bilateral pleural fluid. 3. Stable cardiomegaly, pulmonary vascular congestion and mild right lung interstitial edema.   Electronically Signed   By: Claudie Revering M.D.   On: 11/15/2014 08:22   Dg Chest Port 1v Same Day  11/14/2014   CLINICAL DATA:  Right side PICC line placement.  EXAM: PORTABLE CHEST - 1 VIEW SAME DAY  COMPARISON:  11/14/2014  FINDINGS: Right PICC line is in place with the tip in the SVC. Endotracheal tube and NG tube are unchanged. There is cardiomegaly. Bilateral lower lobe airspace opacities with small effusions.  Mild vascular congestion.  IMPRESSION: Right PICC line tip in the SVC.  Continued cardiomegaly with vascular congestion.  Small effusions with bibasilar atelectasis or infiltrates.   Electronically Signed   By: Rolm Baptise M.D.   On: 11/14/2014 11:45    Scheduled Meds: . antiseptic oral rinse  7 mL Mouth Rinse BID  . antiseptic oral rinse  7 mL Mouth Rinse QID  . aspirin  300 mg Rectal Daily  . budesonide (PULMICORT) nebulizer solution  0.25 mg Nebulization BID  . chlorhexidine gluconate  15 mL Mouth Rinse BID  . cyanocobalamin  1,000 mcg Intramuscular Daily  . enoxaparin (LOVENOX) injection  40 mg Subcutaneous Q24H  . insulin aspart  0-15 Units Subcutaneous 6 times per day  . insulin glargine  10 Units  Subcutaneous BID  . latanoprost  1 drop Both Eyes QHS  . levalbuterol  0.63 mg Nebulization Q6H  . methylPREDNISolone (SOLU-MEDROL) injection  80 mg Intravenous Q6H  . pantoprazole (PROTONIX) IV  40 mg Intravenous Daily  . piperacillin-tazobactam (ZOSYN)  IV  3.375 g Intravenous Q8H  . sodium chloride  10-40 mL Intracatheter Q12H  . vancomycin  750 mg Intravenous Q12H   Continuous Infusions: . 0.9 % NaCl with KCl 20 mEq / L 100 mL/hr at 11/16/14 7902  . amiodarone 30 mg/hr (11/15/14 2314)  . fentaNYL infusion INTRAVENOUS 50 mcg/hr (11/16/14 0754)  . midazolam (VERSED) infusion 1 mg/hr (11/15/14 1600)  . phenylephrine (NEO-SYNEPHRINE) Adult infusion Stopped (11/15/14 1730)    Assessment and plan:  Principal Problem:   HCAP (healthcare-associated pneumonia) Active Problems:   Acute exacerbation of chronic obstructive pulmonary disease (COPD)   Acute on chronic respiratory failure with hypercapnia   Hypotension   Chronic combined systolic and diastolic congestive heart failure   Ischemic cardiomyopathy   Tachyarrhythmia   Essential hypertension   Hyperglycemia, drug-induced   Obesity   Anemia, iron deficiency   Vitamin B12 deficiency    1. Acute encephalopathy with worsening pulmonary status-acute on chronic respiratory failure now requiring intubation for airway protection. On morning of 9/19 patient complained of shortness of breath. Her heart rate was in the 140s secondary to A. fib/A flutter. She became obtunded and subsequently unresponsive. Her oxygen saturation fell to the 80s on 4 L of nasal cannula oxygen. She became hypotensive which was thought to be secondary to IV diltiazem. Dr. Luan Pulling was consulted. She was eventually intubated and started on a Neo-Synephrine drip for hypotension. She was continued on antibiotics, steroid, and nebulizers. -Her follow-up chest x-ray after intubation did not reveal worsening infiltrates or edema. ABG following intubation on 11/14/14  revealed a pH of 7.3 and a PCO2 of 60. -The change in her respiratory status and mental status are unclear as the patient had been improving clinically. She had been given clonazepam shortly before she decompensated, but this is a chronic medication for her. -Investigational studies ordered: CT of the head revealed no acute intracranial findings. Echocardiogram revealed an EF of 30-35% and pulmonary hypertension. Troponin I marginally elevated, secondary to demand ischemia and not ACS. Bilateral lower extremity venous duplex revealed no evidence of DVT. Abdominal ultrasound revealed progression of AAA, but no rupture. -Patient is undergoing weaning trial. Follow-up ABG and chest x-ray are pending. Overall, she appears to be improving clinically.   Hypotension. The patient became hypotensive following intubation and IV diltiazem on 9/19. I do not believe the hypotension was from sepsis, but likely from oral diltiazem superimposed on IV diltiazem given to try to decrease  her heart rate. She was also likely hypotensive from intubation itself. -She was started on Neo-Synephrine with improvement. She was given a bolus of IV fluids. -Her blood pressure has improved and the Neo-Synephrine has been titrated off. We'll continue gentle IV fluids for support, but will decrease the rate given her low EF.   Healthcare acquired pneumonia. On admission, the patient's chest x-ray revealed right mid to lower lung pneumonia/pneumonitis. Her white blood cell count was elevated at 14.2. She was afebrile on admission. She was started on Zosyn and vancomycin. Her white blood cell count did increase secondary to IV steroids. -Her follow-up chest x-rays reveal vascular congestion and decrease bibasilar infiltrates, so there does not appear to be any worsening of her pneumonia radiographically. -Follow-up chest x-ray pending for 9/21. -The patient's white blood cell count has normalized. She remains afebrile. -We'll  continue current management.  Acute COPD exacerbation causing acute on chronic respiratory failure with hypercapnia. Patient is treated chronically with 4-5 L of oxygen 24/7. Patient's ABG on admission revealed pH of 7.35, PCO2 of 64, and PO2 of 108. She was started on BiPAP and her oxygen was titrated accordingly. IV Solu-Medrol and duo nebulizers were started. Symbicort was restarted. -As stated above, the patient decompensated and had to be intubated.  -Albuterol was discontinued due to tachyarrhythmias. She was subsequently started on Xopenex. Solu-Medrol was increased to 80 mg every 6 hours. -Further adjustments in ventilator settings as recommended by Dr. Luan Pulling. The patient is undergoing weaning trial. -Consider titrating down Solu-Medrol over the next 24 hours.  History of tachyarrhythmias/atrial flutter Per review of the patient's chart, she has a history of V. Tach, PAF and SVT. Her EKG on admission revealed atrial flutter. We'll was continued amiodarone and diltiazem for treatment. When she became more tachycardic on 9/19, she was given 10 mg of IV diltiazem. It was unsuccessful. Following intubation, she was started on an amiodarone drip after I briefly discussed this with Dr. Harrington Challenger. Her troponin I is slightly elevated, likely from demand ischemia and from rapid atrial flutter/fib. -Her TSH was within normal limits. -Her 2-D echocardiogram revealed an EF of 30-35%. She is treated chronically with Plavix. Will review her chart more to see why she was not started on anticoagulation for previous EF of 40-45%. She is on prophylactic Lovenox. -Her heart rate has improved on the amiodarone drip.  Ischemic cardiomyopathy with chronic combined heart failure. Echocardiogram February 2016 revealed an EF of 40-45% and grade 1 diastolic dysfunction. Patient's follow-up chest x-rays reveal minimal right lung interstitial edema and small bilateral pleural effusions; therefore the patient may have  mild acute on chronic combined heart failure. -On admission, she was continued on Lasix, Plavix, isosorbide mononitrate, and pravastatin-they are being held due to intubation. Per rectal aspirin was started. We'll curbside consult cardiology if needed for discussion about anticoagulation. -Her BNP was 1064 on 9/21. -We'll consider restarting small doses of Lasix if her blood pressure continues to improve off of pressors. I do not believe she has significant pulmonary edema at this point.  -Follow-up chest x-ray is pending for 921. We'll decrease the IV fluids a little.  Hyperglycemia, likely steroid-induced. Sliding scale NovoLog and Lantus were started. Her CBGs are trending up on sliding scale NovoLog. Sliding scale NovoLog was changed to every 4 hours. Lantus was started and increased slightly.   Normocytic anemia; iron deficiency; vitamin B12 deficiency.  Patient's hemoglobin ranges from 10.0-11.0.  Iron studies revealed a low ferritin of 10, low iron of 10,  TIBC of 357, and low vitamin B12 of 178. Total iron, ferritin, and vitamin B12 ordered and are pending. Her TSH was within normal limits. -Vitamin B12 injections daily 3 days ordered. We'll hold off on iron infusion and packed red blood cells as her hemoglobin is more or less stable.  History of AAA. Patient has a history of abdominal aortic aneurysm. She is followed by [facility] cardiology and vascular surgery. Apparently there was an outpatient abdominal aortic aneurysm duplex December 2015 per chart review. I could not pull up the results. -Ultrasound of the abdomen was ordered on 9/20. The results revealed an increase in the aneurysm to 5.1 cm compared to 2011 when it measured 3.5 cm. There was no evidence of rupture. -We'll continue to monitor and refer back to cardiology/vascular surgery following discharge.  DVT prophylaxis with Lovenox. GI prophylaxis with Protonix.     Time spent: 40 minutes of critical care  time.    [facility] Pager [phone]. If 7PM-7AM, please contact night-coverage at [email or web address], password [facility] 11/16/2014, 9:14 AM  LOS: 5 days

## 2014-11-16 NOTE — Progress Notes (Signed)
Subjective: She is significantly better this morning. Her blood gas has improved she is more awake and alert and less agitated. She is currently weaning and weaning parameters look pretty good. She's not having a lot of secretions. She is writing notes and attempting to take care of her husband from the ICU bed  Objective: Vital signs in last 24 hours: Temp:  [97.3 F (36.3 C)-98.9 F (37.2 C)] 97.4 F (36.3 C) (09/21 0400) Pulse Rate:  [68-118] 68 (09/21 0600) Resp:  [13-23] 16 (09/21 0600) BP: (100-149)/(30-87) 111/77 mmHg (09/21 0600) SpO2:  [93 %-100 %] 99 % (09/21 0748) FiO2 (%):  [40 %] 40 % (09/21 0750) Weight:  [79.7 kg (175 lb 11.3 oz)] 79.7 kg (175 lb 11.3 oz) (09/21 0500) Weight change: 2.1 kg (4 lb 10.1 oz) Last BM Date: 10/27/2014  Intake/Output from previous day: 09/20 0701 - 09/21 0700 In: 2990.1 [I.V.:2490.1; IV Piggyback:500] Out: 500 [Urine:500]  PHYSICAL EXAM General appearance: alert, moderate distress and Still intubated. She is weaning now Resp: rhonchi bilaterally Cardio: irregularly irregular rhythm GI: soft, non-tender; bowel sounds normal; no masses,  no organomegaly Extremities: extremities normal, atraumatic, no cyanosis or edema  Lab Results:  Results for orders placed or performed during the hospital encounter of 11/17/2014 (from the past 48 hour(s))  Troponin I (q 6hr x 3)     Status: None   Collection Time: 11/14/14  8:33 AM  Result Value Ref Range   Troponin I <0.03 <0.031 ng/mL    Comment:        NO INDICATION OF MYOCARDIAL INJURY.   Basic metabolic panel     Status: Abnormal   Collection Time: 11/14/14  8:34 AM  Result Value Ref Range   Sodium 140 135 - 145 mmol/L   Potassium 5.1 3.5 - 5.1 mmol/L    Comment: DELTA CHECK NOTED   Chloride 95 (L) 101 - 111 mmol/L   CO2 38 (H) 22 - 32 mmol/L   Glucose, Bld 235 (H) 65 - 99 mg/dL   BUN 14 6 - 20 mg/dL   Creatinine, Ser 0.65 0.44 - 1.00 mg/dL   Calcium 8.6 (L) 8.9 - 10.3 mg/dL   GFR calc  non Af Amer >60 >60 mL/min   GFR calc Af Amer >60 >60 mL/min    Comment: (NOTE) The eGFR has been calculated using the CKD EPI equation. This calculation has not been validated in all clinical situations. eGFR's persistently <60 mL/min signify possible Chronic Kidney Disease.    Anion gap 7 5 - 15  CBC     Status: Abnormal   Collection Time: 11/14/14  8:34 AM  Result Value Ref Range   WBC 22.4 (H) 4.0 - 10.5 K/uL   RBC 4.18 3.87 - 5.11 MIL/uL   Hemoglobin 11.3 (L) 12.0 - 15.0 g/dL   HCT 40.3 36.0 - 46.0 %   MCV 96.4 78.0 - 100.0 fL   MCH 27.0 26.0 - 34.0 pg   MCHC 28.0 (L) 30.0 - 36.0 g/dL   RDW 15.9 (H) 11.5 - 15.5 %   Platelets 454 (H) 150 - 400 K/uL  Draw ABG 1 hour after initiation of ventilator     Status: Abnormal   Collection Time: 11/14/14  9:35 AM  Result Value Ref Range   FIO2 100.00    Delivery systems VENTILATOR    Mode PRESSURE REGULATED VOLUME CONTROL    VT 420 mL   LHR 16 resp/min   Peep/cpap 5.0 cm H20   pH, Arterial  7.340 (L) 7.350 - 7.450   pCO2 arterial 59.6 (HH) 35.0 - 45.0 mmHg    Comment: CRITICAL RESULT CALLED TO, READ BACK BY AND VERIFIED WITH: MICHAEL GRAY RN BY ROBIN POWELLRRT AT 0946 CRITICAL RESULT CALLED TO, READ BACK BY AND VERIFIED WITH:  MICHAEL GRAY RN BY ROBIN POWELL RRT AT 0945 ON 11/14/14    pO2, Arterial 273 (H) 80.0 - 100.0 mmHg   Bicarbonate 28.8 (H) 20.0 - 24.0 mEq/L   Acid-Base Excess 5.8 (H) 0.0 - 2.0 mmol/L   O2 Saturation 98.7 %   Collection site REVIEWED BY    Drawn by [id number]    Sample type ARTERIAL    Allens test (pass/fail) PASS PASS  Glucose, capillary     Status: Abnormal   Collection Time: 11/14/14 10:25 AM  Result Value Ref Range   Glucose-Capillary 237 (H) 65 - 99 mg/dL   Comment 1 Document in Chart    Comment 2 Repeat Test   Glucose, capillary     Status: Abnormal   Collection Time: 11/14/14 11:53 AM  Result Value Ref Range   Glucose-Capillary 194 (H) 65 - 99 mg/dL   Comment 1 Notify RN    Comment 2 Document in  Chart   Troponin I (q 6hr x 3)     Status: Abnormal   Collection Time: 11/14/14  2:25 PM  Result Value Ref Range   Troponin I 0.05 (H) <0.031 ng/mL    Comment:        PERSISTENTLY INCREASED TROPONIN VALUES IN THE RANGE OF 0.04-0.49 ng/mL CAN BE SEEN IN:       -UNSTABLE ANGINA       -CONGESTIVE HEART FAILURE       -MYOCARDITIS       -CHEST TRAUMA       -ARRYHTHMIAS       -LATE PRESENTING MYOCARDIAL INFARCTION       -COPD   CLINICAL FOLLOW-UP RECOMMENDED.   Glucose, capillary     Status: None   Collection Time: 11/14/14  5:09 PM  Result Value Ref Range   Glucose-Capillary 79 65 - 99 mg/dL   Comment 1 Notify RN    Comment 2 Document in Chart   Troponin I (q 6hr x 3)     Status: Abnormal   Collection Time: 11/14/14  8:00 PM  Result Value Ref Range   Troponin I 0.07 (H) <0.031 ng/mL    Comment:        PERSISTENTLY INCREASED TROPONIN VALUES IN THE RANGE OF 0.04-0.49 ng/mL CAN BE SEEN IN:       -UNSTABLE ANGINA       -CONGESTIVE HEART FAILURE       -MYOCARDITIS       -CHEST TRAUMA       -ARRYHTHMIAS       -LATE PRESENTING MYOCARDIAL INFARCTION       -COPD   CLINICAL FOLLOW-UP RECOMMENDED.   Glucose, capillary     Status: Abnormal   Collection Time: 11/14/14  9:43 PM  Result Value Ref Range   Glucose-Capillary 100 (H) 65 - 99 mg/dL  Blood gas, arterial     Status: Abnormal   Collection Time: 11/15/14  3:50 AM  Result Value Ref Range   FIO2 0.40    Delivery systems VENTILATOR    Mode PRESSURE REGULATED VOLUME CONTROL    VT 420 mL   LHR 16.0 resp/min   Peep/cpap 5.0 cm H20   pH, Arterial 7.443 7.350 - 7.450   pCO2 arterial  51.2 (H) 35.0 - 45.0 mmHg   pO2, Arterial 62.7 (L) 80.0 - 100.0 mmHg   Bicarbonate 32.8 (H) 20.0 - 24.0 mEq/L   TCO2 14.0 0 - 100 mmol/L   Acid-Base Excess 9.9 (H) 0.0 - 2.0 mmol/L   O2 Saturation 89.6 %   Collection site LEFT RADIAL    Drawn by [id number]    Sample type ARTERIAL DRAW    Allens test (pass/fail) PASS PASS  CBC     Status: Abnormal    Collection Time: 11/15/14  4:15 AM  Result Value Ref Range   WBC 15.3 (H) 4.0 - 10.5 K/uL   RBC 3.76 (L) 3.87 - 5.11 MIL/uL   Hemoglobin 10.1 (L) 12.0 - 15.0 g/dL   HCT 34.7 (L) 36.0 - 46.0 %   MCV 92.3 78.0 - 100.0 fL   MCH 26.9 26.0 - 34.0 pg   MCHC 29.1 (L) 30.0 - 36.0 g/dL   RDW 16.4 (H) 11.5 - 15.5 %   Platelets 403 (H) 150 - 400 K/uL  Comprehensive metabolic panel     Status: Abnormal   Collection Time: 11/15/14  4:15 AM  Result Value Ref Range   Sodium 141 135 - 145 mmol/L   Potassium 3.7 3.5 - 5.1 mmol/L    Comment: DELTA CHECK NOTED   Chloride 97 (L) 101 - 111 mmol/L   CO2 36 (H) 22 - 32 mmol/L   Glucose, Bld 112 (H) 65 - 99 mg/dL   BUN 15 6 - 20 mg/dL   Creatinine, Ser 0.62 0.44 - 1.00 mg/dL   Calcium 7.8 (L) 8.9 - 10.3 mg/dL   Total Protein 5.5 (L) 6.5 - 8.1 g/dL   Albumin 2.8 (L) 3.5 - 5.0 g/dL   AST 64 (H) 15 - 41 U/L   ALT 40 14 - 54 U/L   Alkaline Phosphatase 55 38 - 126 U/L   Total Bilirubin 0.8 0.3 - 1.2 mg/dL   GFR calc non Af Amer >60 >60 mL/min   GFR calc Af Amer >60 >60 mL/min    Comment: (NOTE) The eGFR has been calculated using the CKD EPI equation. This calculation has not been validated in all clinical situations. eGFR's persistently <60 mL/min signify possible Chronic Kidney Disease.    Anion gap 8 5 - 15  Glucose, capillary     Status: None   Collection Time: 11/15/14  7:31 AM  Result Value Ref Range   Glucose-Capillary 92 65 - 99 mg/dL   Comment 1 Notify RN    Comment 2 Document in Chart   Lactic acid, plasma     Status: None   Collection Time: 11/15/14  9:12 AM  Result Value Ref Range   Lactic Acid, Venous 1.3 0.5 - 2.0 mmol/L  Glucose, capillary     Status: Abnormal   Collection Time: 11/15/14 11:30 AM  Result Value Ref Range   Glucose-Capillary 108 (H) 65 - 99 mg/dL   Comment 1 Notify RN    Comment 2 Document in Chart   Vancomycin, trough     Status: None   Collection Time: 11/15/14 12:27 PM  Result Value Ref Range    Vancomycin Tr 17 10.0 - 20.0 ug/mL  Glucose, capillary     Status: Abnormal   Collection Time: 11/15/14  5:03 PM  Result Value Ref Range   Glucose-Capillary 118 (H) 65 - 99 mg/dL   Comment 1 Notify RN    Comment 2 Document in Chart   Glucose, capillary  Status: Abnormal   Collection Time: 11/15/14  8:36 PM  Result Value Ref Range   Glucose-Capillary 120 (H) 65 - 99 mg/dL  Glucose, capillary     Status: Abnormal   Collection Time: 11/16/14 12:29 AM  Result Value Ref Range   Glucose-Capillary 122 (H) 65 - 99 mg/dL  Glucose, capillary     Status: Abnormal   Collection Time: 11/16/14  4:05 AM  Result Value Ref Range   Glucose-Capillary 109 (H) 65 - 99 mg/dL  Basic metabolic panel     Status: Abnormal   Collection Time: 11/16/14  4:15 AM  Result Value Ref Range   Sodium 140 135 - 145 mmol/L   Potassium 3.5 3.5 - 5.1 mmol/L   Chloride 102 101 - 111 mmol/L   CO2 32 22 - 32 mmol/L   Glucose, Bld 109 (H) 65 - 99 mg/dL   BUN 18 6 - 20 mg/dL   Creatinine, Ser 0.54 0.44 - 1.00 mg/dL   Calcium 7.8 (L) 8.9 - 10.3 mg/dL   GFR calc non Af Amer >60 >60 mL/min   GFR calc Af Amer >60 >60 mL/min    Comment: (NOTE) The eGFR has been calculated using the CKD EPI equation. This calculation has not been validated in all clinical situations. eGFR's persistently <60 mL/min signify possible Chronic Kidney Disease.    Anion gap 6 5 - 15  CBC     Status: Abnormal   Collection Time: 11/16/14  4:15 AM  Result Value Ref Range   WBC 6.0 4.0 - 10.5 K/uL   RBC 3.46 (L) 3.87 - 5.11 MIL/uL   Hemoglobin 9.5 (L) 12.0 - 15.0 g/dL   HCT 31.3 (L) 36.0 - 46.0 %   MCV 90.5 78.0 - 100.0 fL   MCH 27.5 26.0 - 34.0 pg   MCHC 30.4 30.0 - 36.0 g/dL   RDW 16.2 (H) 11.5 - 15.5 %   Platelets 224 150 - 400 K/uL  Blood gas, arterial     Status: Abnormal   Collection Time: 11/16/14  6:10 AM  Result Value Ref Range   FIO2 40.00    Delivery systems VENTILATOR    Mode PRESSURE REGULATED VOLUME CONTROL    VT 420 mL    LHR 16.0 resp/min   Peep/cpap 5.0 cm H20   pH, Arterial 7.428 7.350 - 7.450   pCO2 arterial 46.8 (H) 35.0 - 45.0 mmHg   pO2, Arterial 81.8 80.0 - 100.0 mmHg   Bicarbonate 29.6 (H) 20.0 - 24.0 mEq/L   TCO2 12.1 0 - 100 mmol/L   Acid-Base Excess 6.0 (H) 0.0 - 2.0 mmol/L   O2 Saturation 94.2 %   Collection site LEFT BRACHIAL    Drawn by [id number]    Sample type ARTERIAL    Allens test (pass/fail) NOT INDICATED (A) PASS  Glucose, capillary     Status: Abnormal   Collection Time: 11/16/14  7:54 AM  Result Value Ref Range   Glucose-Capillary 104 (H) 65 - 99 mg/dL   Comment 1 Notify RN    Comment 2 Document in Chart     ABGS  Recent Labs  11/16/14 0610  PHART 7.428  PO2ART 81.8  TCO2 12.1  HCO3 29.6*   CULTURES Recent Results (from the past 240 hour(s))  MRSA PCR Screening     Status: None   Collection Time: 10/30/2014 11:59 PM  Result Value Ref Range Status   MRSA by PCR NEGATIVE NEGATIVE Final    Comment:  The GeneXpert MRSA Assay (FDA approved for NASAL specimens only), is one component of a comprehensive MRSA colonization surveillance program. It is not intended to diagnose MRSA infection nor to guide or monitor treatment for MRSA infections.    Studies/Results: Ct Head Wo Contrast  11/15/2014   CLINICAL DATA:  Acute encephalopathy.  Ventilated patient.  EXAM: CT HEAD WITHOUT CONTRAST  TECHNIQUE: Contiguous axial images were obtained from the base of the skull through the vertex without intravenous contrast.  COMPARISON:  None.  FINDINGS: No mass effect or midline shift. No evidence of acute intracranial hemorrhage, or infarction. No abnormal extra-axial fluid collections. Gray-white matter differentiation is normal. Basal cisterns are preserved. There is atrophy and chronic small vessel disease changes.  No depressed skull fractures. Visualized paranasal sinuses and mastoid air cells are not opacified. There is a partially visualized catheter within the left nasal  cavity.  IMPRESSION: No acute intracranial abnormality.  Atrophy, chronic microvascular disease.   Electronically Signed   By: Fidela Salisbury M.D.   On: 11/15/2014 10:46   US Abdomen Complete  11/15/2014   CLINICAL DATA:  Known abdominal aortic aneurysm  EXAM: ULTRASOUND ABDOMEN COMPLETE  COMPARISON:  None.  FINDINGS: Gallbladder: No gallstones or wall thickening visualized. No sonographic Murphy sign noted.  Common bile duct: Diameter: 3.2 mm.  Liver: Increased echogenicity consistent with fatty infiltration.  IVC: No abnormality visualized.  Pancreas: Not well visualized  Spleen: Size and appearance within normal limits.  Right Kidney: Length: 11.4 cm. Echogenicity within normal limits. No mass or hydronephrosis visualized.  Left Kidney: Length: 11.3 cm. Echogenicity within normal limits. No mass or hydronephrosis visualized.  Abdominal aorta: There is aneurysmal dilatation of the mid to distal aorta to 5.1 cm. This has increased in size from 2011 at which time it measured 3.5 cm.  Other findings: None.  IMPRESSION: Increase in size in of abdominal aorta to 5.1 cm. Recommend followup by abdomen and pelvis CTA in 3-6 months, and vascular surgery referral/consultation if not already obtained. This recommendation follows ACR consensus guidelines: White Paper of the ACR Incidental Findings Committee II on Vascular Findings. J Am Coll Radiol 2013; 10:789-794.  Fatty liver.  No other focal abnormality is noted.   Electronically Signed   By: Inez Catalina M.D.   On: 11/15/2014 09:52   US Venous Img Lower Bilateral  11/15/2014   CLINICAL DATA:  Respiratory failure.  Evaluate for DVT.  EXAM: BILATERAL LOWER EXTREMITY VENOUS DOPPLER ULTRASOUND  TECHNIQUE: Gray-scale sonography with graded compression, as well as color Doppler and duplex ultrasound were performed to evaluate the lower extremity deep venous systems from the level of the common femoral vein and including the common femoral, femoral, profunda femoral,  popliteal and calf veins including the posterior tibial, peroneal and gastrocnemius veins when visible. The superficial great saphenous vein was also interrogated. Spectral Doppler was utilized to evaluate flow at rest and with distal augmentation maneuvers in the common femoral, femoral and popliteal veins.  COMPARISON:  None.  FINDINGS: RIGHT LOWER EXTREMITY  Common Femoral Vein: No evidence of thrombus. Normal compressibility, respiratory phasicity and response to augmentation.  Saphenofemoral Junction: No evidence of thrombus. Normal compressibility and flow on color Doppler imaging.  Profunda Femoral Vein: No evidence of thrombus. Normal compressibility and flow on color Doppler imaging.  Femoral Vein: No evidence of thrombus. Normal compressibility, respiratory phasicity and response to augmentation.  Popliteal Vein: No evidence of thrombus. Normal compressibility, respiratory phasicity and response to augmentation.  Calf Veins: No evidence  of thrombus. Normal compressibility and flow on color Doppler imaging.  Superficial Great Saphenous Vein: No evidence of thrombus. Normal compressibility and flow on color Doppler imaging.  Venous Reflux:  None.  Other Findings:  None.  LEFT LOWER EXTREMITY  Common Femoral Vein: No evidence of thrombus. Normal compressibility, respiratory phasicity and response to augmentation.  Saphenofemoral Junction: No evidence of thrombus. Normal compressibility and flow on color Doppler imaging.  Profunda Femoral Vein: No evidence of thrombus. Normal compressibility and flow on color Doppler imaging.  Femoral Vein: No evidence of thrombus. Normal compressibility, respiratory phasicity and response to augmentation.  Popliteal Vein: No evidence of thrombus. Normal compressibility, respiratory phasicity and response to augmentation.  Calf Veins: No evidence of thrombus. Normal compressibility and flow on color Doppler imaging.  Superficial Great Saphenous Vein: No evidence of thrombus.  Normal compressibility and flow on color Doppler imaging.  Venous Reflux:  None.  Other Findings:  None.  IMPRESSION: No evidence of DVT within either lower extremity.   Electronically Signed   By: Sandi Mariscal M.D.   On: 11/15/2014 09:27   Portable Chest Xray  11/15/2014   CLINICAL DATA:  On a ventilator.  EXAM: PORTABLE CHEST - 1 VIEW  COMPARISON:  Yesterday.  FINDINGS: Endotracheal tube in satisfactory position. Nasogastric tube extending into the stomach. Right PICC tip poorly visualized in the region of the superior cavoatrial junction. Decreased bibasilar airspace opacity. Mildly decreased bilateral pleural fluid. The pulmonary vasculature and interstitial markings remain prominent, specially on the right. The cardiac silhouette remains enlarged.  IMPRESSION: 1. Decreased bibasilar atelectasis. 2. Mildly decreased bilateral pleural fluid. 3. Stable cardiomegaly, pulmonary vascular congestion and mild right lung interstitial edema.   Electronically Signed   By: Claudie Revering M.D.   On: 11/15/2014 08:22   Dg Chest Port 1 View  11/14/2014   CLINICAL DATA:  Pneumonia, ET tube placement.  EXAM: PORTABLE CHEST - 1 VIEW  COMPARISON:  Chest x-ray dated 09/16/ 2016 and chest x-ray dated 09/07/2014.  FINDINGS: Interval placement of an endotracheal tube with tip well positioned above the level of the carina. Enteric tube passes below the diaphragm.  Cardiomegaly appears stable. Lungs again appear hyperinflated suggesting COPD. Left lung base remains difficult to characterize due to the enlarged heart size.  Atelectasis versus scarring within the right mid lung region. There is slightly improved aeration at the right lung base. No new lung findings seen. No evidence of a large pleural effusion. No pneumothorax.  IMPRESSION: 1. Slightly improved aeration at the right lung base suggesting either resolving pneumonia or decreased atelectasis or edema. No new lung findings.  2. Cardiomegaly, unchanged.  3. Probable COPD.   4. Endotracheal tube well positioned with tip approximately 4 cm above the level of the carina.   Electronically Signed   By: Franki Cabot M.D.   On: 11/14/2014 09:12   Dg Chest Port 1v Same Day  11/14/2014   CLINICAL DATA:  Right side PICC line placement.  EXAM: PORTABLE CHEST - 1 VIEW SAME DAY  COMPARISON:  11/14/2014  FINDINGS: Right PICC line is in place with the tip in the SVC. Endotracheal tube and NG tube are unchanged. There is cardiomegaly. Bilateral lower lobe airspace opacities with small effusions. Mild vascular congestion.  IMPRESSION: Right PICC line tip in the SVC.  Continued cardiomegaly with vascular congestion.  Small effusions with bibasilar atelectasis or infiltrates.   Electronically Signed   By: Rolm Baptise M.D.   On: 11/14/2014 11:45  Medications:  Prior to Admission:  Prescriptions prior to admission  Medication Sig Dispense Refill Last Dose  . acetaminophen-codeine (TYLENOL #3) 300-30 MG per tablet Take 1 tablet by mouth every 4 (four) hours as needed for moderate pain.   unknown  . amiodarone (PACERONE) 200 MG tablet Take 200 mg by mouth 2 (two) times daily.    11/04/2014 at Unknown time  . aspirin EC 81 MG tablet Take 81 mg by mouth at bedtime.   11/10/2014 at Unknown time  . budesonide-formoterol (SYMBICORT) 160-4.5 MCG/ACT inhaler Inhale 2 puffs into the lungs 2 (two) times daily. 1 Inhaler 6 10/31/2014 at Unknown time  . clonazePAM (KLONOPIN) 0.5 MG tablet Take 0.25-0.5 mg by mouth every 6 (six) hours as needed for anxiety.    11/23/2014 at Unknown time  . clopidogrel (PLAVIX) 75 MG tablet Take 1 tablet (75 mg total) by mouth at bedtime.   11/10/2014 at Unknown time  . diltiazem (CARDIZEM CD) 300 MG 24 hr capsule TAKE 1 CAPSULE IN THE EVENING. 30 capsule 1 11/10/2014 at Unknown time  . furosemide (LASIX) 40 MG tablet Take 1 tablet (40 mg total) by mouth daily. (Patient taking differently: Take 40 mg by mouth every morning. ) 30 tablet 3 11/06/2014 at Unknown time  .  guaiFENesin (MUCINEX) 600 MG 12 hr tablet Take 1 tablet by mouth every 12 hours as needed for cough and congestion   11/14/2014 at Unknown time  . ipratropium (ATROVENT) 0.02 % nebulizer solution USE 1 VIAL IN NEBULIZER EVERY 6 HOURS. (Patient taking differently: USE 1 VIAL IN NEBULIZER EVERY 4 HOURS FOR SHORTNESS OF BREATH/WHEEZING) 300 mL 1 UNKNOWN  . isosorbide mononitrate (IMDUR) 30 MG 24 hr tablet TAKE ONE TABLET BY MOUTH AT BEDTIME. (Patient taking differently: TAKE ONE TABLET BY MOUTH EVERY MORNING) 30 tablet 5 11/23/2014 at Unknown time  . latanoprost (XALATAN) 0.005 % ophthalmic solution Place 1 drop into both eyes at bedtime.   11/21/2014  . levalbuterol (XOPENEX HFA) 45 MCG/ACT inhaler Inhale 2 puffs into the lungs every 4 (four) hours as needed for wheezing. (Patient taking differently: Inhale 2 puffs into the lungs every 4 (four) hours as needed for wheezing or shortness of breath. ) 15 g 6 Past Month at Unknown time  . mirtazapine (REMERON) 15 MG tablet Take 7.5 mg by mouth at bedtime. Take 1/2 tablet by mouth at bedtime   11/10/2014 at Unknown time  . nitroGLYCERIN (NITROSTAT) 0.4 MG SL tablet Place 0.4 mg under the tongue every 5 (five) minutes as needed for chest pain (may repeat x3). Chest pain   11/10/2014 at Unknown time  . nystatin (MYCOSTATIN) 100000 UNIT/ML suspension Take 5 mLs by mouth 4 (four) times daily.    10/29/2014 at Unknown time  . omeprazole (PRILOSEC) 20 MG capsule Take 20 mg by mouth daily as needed (for acid reflux).    unknown  . ondansetron (ZOFRAN) 8 MG tablet TAKE 1/2 TABLET ONCE DAILY AS NEEDED FOR NAUSEA   11/17/2014 at Unknown time  . OXYGEN Inhale 4.5 L into the lungs continuous.    11/05/2014 at Unknown time  . potassium chloride SA (K-DUR,KLOR-CON) 20 MEQ tablet Take 2 tablets by mouth every morning   11/16/2014 at Unknown time  . pravastatin (PRAVACHOL) 80 MG tablet Take 80 mg by mouth at bedtime.   11/10/2014 at Unknown time  . ranitidine (ZANTAC) 150 MG capsule  Take 150 mg by mouth daily as needed for heartburn.    unknown at Unknown time  .  tamsulosin (FLOMAX) 0.4 MG CAPS capsule TAKE 1 CAPSULE AT BEDTIME FOR INCONTINENCE.   11/10/2014 at Unknown time   Scheduled: . antiseptic oral rinse  7 mL Mouth Rinse BID  . antiseptic oral rinse  7 mL Mouth Rinse QID  . aspirin  300 mg Rectal Daily  . budesonide (PULMICORT) nebulizer solution  0.25 mg Nebulization BID  . chlorhexidine gluconate  15 mL Mouth Rinse BID  . cyanocobalamin  1,000 mcg Intramuscular Daily  . enoxaparin (LOVENOX) injection  40 mg Subcutaneous Q24H  . insulin aspart  0-15 Units Subcutaneous 6 times per day  . insulin glargine  10 Units Subcutaneous BID  . latanoprost  1 drop Both Eyes QHS  . levalbuterol  0.63 mg Nebulization Q6H  . methylPREDNISolone (SOLU-MEDROL) injection  80 mg Intravenous Q6H  . pantoprazole (PROTONIX) IV  40 mg Intravenous Daily  . piperacillin-tazobactam (ZOSYN)  IV  3.375 g Intravenous Q8H  . sodium chloride  10-40 mL Intracatheter Q12H  . vancomycin  750 mg Intravenous Q12H   Continuous: . 0.9 % NaCl with KCl 20 mEq / L 100 mL/hr at 11/15/14 1600  . amiodarone 30 mg/hr (11/15/14 2314)  . fentaNYL infusion INTRAVENOUS 50 mcg/hr (11/16/14 0754)  . midazolam (VERSED) infusion 1 mg/hr (11/15/14 1600)  . phenylephrine (NEO-SYNEPHRINE) Adult infusion Stopped (11/15/14 1730)   VYX:[id number], fentaNYL (SUBLIMAZE) injection, fentaNYL (SUBLIMAZE) injection, ipratropium, midazolam, midazolam, ondansetron **OR** ondansetron (ZOFRAN) IV, sodium chloride  Assesment: She was admitted with healthcare associated pneumonia and suffered acute respiratory distress, respiratory insufficiency and was intubated and placed on mechanical ventilation. She has COPD at baseline. She has improved. She has potential for extubation later today  She developed atrial fibrillation I think is a response to her acute respiratory problem and she is on amiodarone for that.  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

## 2014-11-17 DIAGNOSIS — I4892 Unspecified atrial flutter: Secondary | ICD-10-CM

## 2014-11-17 DIAGNOSIS — I5023 Acute on chronic systolic (congestive) heart failure: Secondary | ICD-10-CM

## 2014-11-17 LAB — BLOOD GAS, ARTERIAL
ACID-BASE EXCESS: 6.9 mmol/L — AB (ref 0.0–2.0)
Bicarbonate: 30.4 mEq/L — ABNORMAL HIGH (ref 20.0–24.0)
DRAWN BY: 21310
FIO2: 40
MECHVT: 420 mL
O2 SAT: 93.3 %
PCO2 ART: 47 mmHg — AB (ref 35.0–45.0)
PEEP/CPAP: 5 cmH2O
PH ART: 7.438 (ref 7.350–7.450)
RATE: 16 resp/min
TCO2: 12.1 mmol/L (ref 0–100)
pO2, Arterial: 75.9 mmHg — ABNORMAL LOW (ref 80.0–100.0)

## 2014-11-17 LAB — BASIC METABOLIC PANEL
Anion gap: 6 (ref 5–15)
BUN: 17 mg/dL (ref 6–20)
CALCIUM: 7.6 mg/dL — AB (ref 8.9–10.3)
CHLORIDE: 103 mmol/L (ref 101–111)
CO2: 33 mmol/L — ABNORMAL HIGH (ref 22–32)
CREATININE: 0.5 mg/dL (ref 0.44–1.00)
GFR calc Af Amer: >60 mL/min (ref >60)
GFR calc non Af Amer: >60 mL/min (ref >60)
Glucose, Bld: 115 mg/dL — ABNORMAL HIGH (ref 65–99)
Potassium: 3.2 mmol/L — ABNORMAL LOW (ref 3.5–5.1)
SODIUM: 142 mmol/L (ref 135–145)

## 2014-11-17 LAB — GLUCOSE, CAPILLARY
GLUCOSE-CAPILLARY: 122 mg/dL — AB (ref 65–99)
GLUCOSE-CAPILLARY: 107 mg/dL — AB (ref 65–99)
GLUCOSE-CAPILLARY: 113 mg/dL — AB (ref 65–99)
GLUCOSE-CAPILLARY: 111 mg/dL — AB (ref 65–99)
Glucose-Capillary: 116 mg/dL — ABNORMAL HIGH (ref 65–99)

## 2014-11-17 LAB — CBC
HCT: 31.9 % — ABNORMAL LOW (ref 36.0–46.0)
Hemoglobin: 9.6 g/dL — ABNORMAL LOW (ref 12.0–15.0)
MCH: 27.1 pg (ref 26.0–34.0)
MCHC: 30.1 g/dL (ref 30.0–36.0)
MCV: 90.1 fL (ref 78.0–100.0)
PLATELETS: 242 10*3/uL (ref 150–400)
RBC: 3.54 MIL/uL — ABNORMAL LOW (ref 3.87–5.11)
RDW: 16 % — AB (ref 11.5–15.5)
WBC: 5.7 10*3/uL (ref 4.0–10.5)

## 2014-11-17 LAB — HEPARIN LEVEL (UNFRACTIONATED): Heparin Unfractionated: 0.49 IU/mL (ref 0.30–0.70)

## 2014-11-17 MED ORDER — VITAL HIGH PROTEIN PO LIQD: 1000.0000 mL | ORAL | Status: DC

## 2014-11-17 MED ORDER — HEPARIN BOLUS VIA INFUSION
3000.0000 [IU] | Freq: Once | INTRAVENOUS | Status: AC
  Administered 2014-11-17: 3000 [IU] via INTRAVENOUS
  Filled 2014-11-17: qty 3000

## 2014-11-17 MED ORDER — FUROSEMIDE 10 MG/ML IJ SOLN
20.0000 mg | Freq: Two times a day (BID) | INTRAMUSCULAR | Status: DC
  Administered 2014-11-17 – 2014-11-18 (×2): 20 mg via INTRAVENOUS
  Filled 2014-11-17 (×2): qty 2

## 2014-11-17 MED ORDER — FENTANYL CITRATE (PF) 2500 MCG/50ML IJ SOLN
INTRAMUSCULAR | Status: AC
  Filled 2014-11-17: qty 50

## 2014-11-17 MED ORDER — FUROSEMIDE 10 MG/ML IJ SOLN
20.0000 mg | Freq: Every day | INTRAMUSCULAR | Status: DC
  Administered 2014-11-17: 20 mg via INTRAVENOUS
  Filled 2014-11-17: qty 2

## 2014-11-17 MED ORDER — METHYLPREDNISOLONE SODIUM SUCC 40 MG IJ SOLR: 40.0000 mg | Freq: Two times a day (BID) | INTRAMUSCULAR | Status: DC

## 2014-11-17 MED ORDER — PRO-STAT SUGAR FREE PO LIQD
30.0000 mL | Freq: Every day | ORAL | Status: DC
  Administered 2014-11-17 – 2014-11-22 (×6): 30 mL
  Filled 2014-11-17 (×6): qty 30

## 2014-11-17 MED ORDER — HEPARIN (PORCINE) IN NACL 100-0.45 UNIT/ML-% IJ SOLN
1000.0000 [IU]/h | INTRAMUSCULAR | Status: DC
  Administered 2014-11-17 – 2014-11-21 (×5): 1000 [IU]/h via INTRAVENOUS
  Filled 2014-11-17 (×5): qty 250

## 2014-11-17 MED ORDER — POTASSIUM CHLORIDE 10 MEQ/100ML IV SOLN
10.0000 meq | INTRAVENOUS | Status: AC
Start: 2014-11-17 — End: 2014-11-17
  Administered 2014-11-17 (×4): 10 meq via INTRAVENOUS
  Filled 2014-11-17 (×4): qty 100

## 2014-11-17 MED ORDER — VITAL AF 1.2 CAL PO LIQD
1000.0000 mL | ORAL | Status: DC
  Administered 2014-11-17 – 2014-11-20 (×3): 1000 mL
  Filled 2014-11-17 (×7): qty 1000

## 2014-11-17 MED ORDER — METHYLPREDNISOLONE SODIUM SUCC 125 MG IJ SOLR
60.0000 mg | Freq: Two times a day (BID) | INTRAMUSCULAR | Status: DC
  Administered 2014-11-17 – 2014-11-18 (×4): 60 mg via INTRAVENOUS
  Filled 2014-11-17 (×4): qty 2

## 2014-11-17 NOTE — Progress Notes (Signed)
Present with patient, her husband and several family members for a follow up meeting. They discussed what a fighter [name] [name] is and her husband also talked about how she has "so many things" she is dealing with now. They appear to be a genuinely supportive family. When asked if they wanted to pray with her, they came to her bedside and we prayed for comfort for her and her family as they wait. Several were tearful after we prayed. We had talked about the hopes and anxieties they were all holding as they waited.

## 2014-11-17 NOTE — Progress Notes (Addendum)
[place] for Heparin Indication: atrial fibrillation  Allergies  Allergen Reactions  . Albuterol Other (See Comments)    Jittery/shakiness  . Levaquin [Levofloxacin In D5w] Nausea Only  . Sulfa Antibiotics Nausea Only    Patient Measurements: Height: 5\' 3"  (160 cm) Weight: 181 lb 3.5 oz (82.2 kg) IBW/kg (Calculated) : 52.4 HEPARIN DW (KG): 68.3   Vital Signs: Temp: 98.5 F (36.9 C) (09/22 0900) Temp Source: Axillary (09/22 0400) BP: 115/43 mmHg (09/22 1000) Pulse Rate: 65 (09/22 1000)  Labs:  Recent Labs  11/14/14 1425 11/14/14 2000  11/15/14 0415 11/16/14 0415 11/17/14 0440  HGB  --   --   < > 10.1* 9.5* 9.6*  HCT  --   --   --  34.7* 31.3* 31.9*  PLT  --   --   --  403* 224 242  CREATININE  --   --   --  0.62 0.54 0.50  TROPONINI 0.05* 0.07*  --   --   --   --   < > = values in this interval not displayed.  Estimated Creatinine Clearance: 63.6 mL/min (by C-G formula based on Cr of 0.5).  Medications:  Scheduled:  . antiseptic oral rinse  7 mL Mouth Rinse BID  . antiseptic oral rinse  7 mL Mouth Rinse QID  . aspirin  300 mg Rectal Daily  . budesonide (PULMICORT) nebulizer solution  0.25 mg Nebulization BID  . chlorhexidine gluconate  15 mL Mouth Rinse BID  . feeding supplement (PRO-STAT SUGAR FREE 64)  30 mL Per Tube Daily  . furosemide  20 mg Intravenous BID  . insulin aspart  0-15 Units Subcutaneous 6 times per day  . insulin glargine  7 Units Subcutaneous BID  . latanoprost  1 drop Both Eyes QHS  . levalbuterol  0.63 mg Nebulization Q6H  . methylPREDNISolone (SOLU-MEDROL) injection  60 mg Intravenous Q12H  . pantoprazole (PROTONIX) IV  40 mg Intravenous Daily  . piperacillin-tazobactam (ZOSYN)  IV  3.375 g Intravenous Q8H  . sodium chloride  10-40 mL Intracatheter Q12H  . vancomycin  750 mg Intravenous Q12H    Assessment: Okay for Protocol, PT/INR WNL on 9/16.  H/H stable.  SQ Lovenox stopped.  Goal of  Therapy:  Heparin level 0.3-0.7 units/ml Monitor platelets by anticoagulation protocol: Yes   Plan:  Heparin Load 3000 units Heparin drip @ 1000 units/hr Check Hep Level in 6-9 hours. Daily Hep Level and CBC while on Heparin   Pricilla Larsson 11/17/2014,1:35 PM  Addum:  Initial heparin level therapeutic.  Cont heparin drip at same rate. F/u am labs. Excell Seltzer, PharmD

## 2014-11-17 NOTE — Progress Notes (Signed)
EKG abnormal results reported to RN

## 2014-11-17 NOTE — Care Management Note (Signed)
Case Management Note  Patient Details  Name: [name] [name] MRN: [id number] Date of Birth: Aug 11, 1940   Expected Discharge Date:  11/15/14               Expected Discharge Plan:  [place]  In-House Referral:  NA  Discharge planning Services  CM Consult  Post Acute Care Choice:    Choice offered to:     DME Arranged:    DME Agency:     HH Arranged:    HH Agency:     Status of Service:  In process, will continue to follow  Medicare Important Message Given:    Date Medicare IM Given:    Medicare IM give by:    Date Additional Medicare IM Given:    Additional Medicare Important Message give by:     If discussed at [place] of Stay Meetings, dates discussed:  11/17/2014  Additional Comments:  [name], RN 11/17/2014, 2:35 PM

## 2014-11-17 NOTE — Progress Notes (Signed)
[facility] PROGRESS NOTE  [name] [name] TKP:[id number] DOB: Sep 22, 1940 DOA: 11/17/2014 PCP: Sherrie Mustache, MD    Code Status: Full code Family Communication: Discussed with husband. Disposition Plan: Discharge when clinically appropriate   Consultants:  Pulmonologist, Dr. Luan Pulling  Procedures:  2-D echocardiogram 11/15/14: Study Conclusions - Left ventricle: The cavity size was normal. Wall thickness was increased in a pattern of mild LVH. Systolic function was moderately to severely reduced. The estimated ejection fraction was in the range of 30% to 35%. The study was not technically sufficient to allow evaluation of LV diastolic dysfunction due to atrial fibrillation. - Aortic valve: Mildly to moderately calcified annulus. Mildly thickened leaflets. Valve area (VTI): 2.06 cm^2. Valve area (Vmax): 1.86 cm^2. - Mitral valve: Mildly calcified annulus. Mildly thickened leaflets . There was mild to moderate regurgitation. The MR vena contracta is 0.4 cm. - Left atrium: The atrium was moderately dilated. - Right ventricle: Systolic function was mildly to moderately reduced. - Right atrium: The atrium was moderately dilated. - Pulmonary arteries: Systolic pressure was moderately increased. PA peak pressure: 45 mm Hg (S). - Inferior vena cava: The vessel was dilated. The respirophasic diameter changes were blunted (< 50%), consistent with elevated central venous pressure. - Technically difficult study.  Mechanical intubation 9/19   Right upper extremity PICC 9/19    BiPAP  Antibiotics:  Zosyn 9/17>>  Vancomycin 9/17>>  HPI/Subjective: Patient is mechanically ventilated. She filled the reaming trial yesterday. Her husband is at the bedside. Questions answered. No acute changes per nursing overnight.  Objective: Filed Vitals:   11/17/14 0800  BP: 106/71  Pulse: 63  Temp:   Resp: 16   Temperature 97.5. Oxygen saturation on the  ventilator 98%.   Intake/Output Summary (Last 24 hours) at 11/17/14 0840 Last data filed at 11/17/14 0747  Gross per 24 hour  Intake 2995.05 ml  Output   3100 ml  Net -104.95 ml   Filed Weights   11/15/14 0456 11/16/14 0500 11/17/14 0500  Weight: 77.6 kg (171 lb 1.2 oz) 79.7 kg (175 lb 11.3 oz) 82.2 kg (181 lb 3.5 oz)    Exam:   General:  74 year old woman on the ventilator, currently sedated, but arousable to provocation.  Cardiovascular: S1, S2, with soft systolic murmur.  Respiratory: Patient is on the ventilator. Lungs with less rhonchi, rare crackles, decreased breath sounds in the bases.  Abdomen: Positive bowel sounds, soft, mild tenderness of her hypogastrium; obese.  Musculoskeletal:/Extremities: No pedal edema.  Neurologic: She is sedated on the ventilator. Yesterday, during the weaning protocol, she nodded appropriately and squeezed the examiner's hands bilaterally.   Data Reviewed: Basic Metabolic Panel:  Recent Labs Lab 11/13/14 0442 11/14/14 0834 11/15/14 0415 11/16/14 0415 11/17/14 0440  NA 141 140 141 140 142  K 3.5 5.1 3.7 3.5 3.2*  CL 93* 95* 97* 102 103  CO2 40* 38* 36* 32 33*  GLUCOSE 147* 235* 112* 109* 115*  BUN 9 14 15 18 17   CREATININE 0.49 0.65 0.62 0.54 0.50  CALCIUM 8.4* 8.6* 7.8* 7.8* 7.6*   Liver Function Tests:  Recent Labs Lab 11/13/2014 1903 11/15/14 0415  AST 18 64*  ALT 11* 40  ALKPHOS 73 55  BILITOT 0.6 0.8  PROT 6.3* 5.5*  ALBUMIN 3.3* 2.8*   No results for input(s): LIPASE, AMYLASE in the last 168 hours. No results for input(s): AMMONIA in the last 168 hours. CBC:  Recent Labs Lab 11/18/2014 1903  11/13/14 5170 11/14/14 0174 11/15/14 0415 11/16/14 0415  11/17/14 0440  WBC 14.2*  < > 8.9 22.4* 15.3* 6.0 5.7  NEUTROABS 11.6*  --   --   --   --   --   --   HGB 10.7*  < > 9.1* 11.3* 10.1* 9.5* 9.6*  HCT 37.3  < > 31.2* 40.3 34.7* 31.3* 31.9*  MCV 94.0  < > 93.4 96.4 92.3 90.5 90.1  PLT 303  < > 229 454* 403*  224 242  < > = values in this interval not displayed. Cardiac Enzymes:  Recent Labs Lab 11/14/14 0833 11/14/14 1425 11/14/14 2000  TROPONINI <0.03 0.05* 0.07*   BNP (last 3 results)  Recent Labs  04/26/14 1358 11/21/2014 1903 11/16/14 0415  BNP 294.0* 549.0* 1064.0*    ProBNP (last 3 results)  Recent Labs  12/29/13 1238 04/15/14 1417 10/18/14 1456  PROBNP 195.0* 230.0* 288.0*    CBG:  Recent Labs Lab 11/16/14 1633 11/16/14 1941 11/16/14 2323 11/17/14 0412 11/17/14 0729  GLUCAP 125* 106* 116* 116* 122*    Recent Results (from the past 240 hour(s))  MRSA PCR Screening     Status: None   Collection Time: 11/08/2014 11:59 PM  Result Value Ref Range Status   MRSA by PCR NEGATIVE NEGATIVE Final    Comment:        The GeneXpert MRSA Assay (FDA approved for NASAL specimens only), is one component of a comprehensive MRSA colonization surveillance program. It is not intended to diagnose MRSA infection nor to guide or monitor treatment for MRSA infections.      Studies: Ct Head Wo Contrast  11/15/2014   CLINICAL DATA:  Acute encephalopathy.  Ventilated patient.  EXAM: CT HEAD WITHOUT CONTRAST  TECHNIQUE: Contiguous axial images were obtained from the base of the skull through the vertex without intravenous contrast.  COMPARISON:  None.  FINDINGS: No mass effect or midline shift. No evidence of acute intracranial hemorrhage, or infarction. No abnormal extra-axial fluid collections. Gray-white matter differentiation is normal. Basal cisterns are preserved. There is atrophy and chronic small vessel disease changes.  No depressed skull fractures. Visualized paranasal sinuses and mastoid air cells are not opacified. There is a partially visualized catheter within the left nasal cavity.  IMPRESSION: No acute intracranial abnormality.  Atrophy, chronic microvascular disease.   Electronically Signed   By: Fidela Salisbury M.D.   On: 11/15/2014 10:46   US Abdomen  Complete  11/15/2014   CLINICAL DATA:  Known abdominal aortic aneurysm  EXAM: ULTRASOUND ABDOMEN COMPLETE  COMPARISON:  None.  FINDINGS: Gallbladder: No gallstones or wall thickening visualized. No sonographic Murphy sign noted.  Common bile duct: Diameter: 3.2 mm.  Liver: Increased echogenicity consistent with fatty infiltration.  IVC: No abnormality visualized.  Pancreas: Not well visualized  Spleen: Size and appearance within normal limits.  Right Kidney: Length: 11.4 cm. Echogenicity within normal limits. No mass or hydronephrosis visualized.  Left Kidney: Length: 11.3 cm. Echogenicity within normal limits. No mass or hydronephrosis visualized.  Abdominal aorta: There is aneurysmal dilatation of the mid to distal aorta to 5.1 cm. This has increased in size from 2011 at which time it measured 3.5 cm.  Other findings: None.  IMPRESSION: Increase in size in of abdominal aorta to 5.1 cm. Recommend followup by abdomen and pelvis CTA in 3-6 months, and vascular surgery referral/consultation if not already obtained. This recommendation follows ACR consensus guidelines: White Paper of the ACR Incidental Findings Committee II on Vascular Findings. J Am Coll Radiol 2013; 10:789-794.  Fatty liver.  No other focal abnormality is noted.   Electronically Signed   By: Inez Catalina M.D.   On: 11/15/2014 09:52   US Venous Img Lower Bilateral  11/15/2014   CLINICAL DATA:  Respiratory failure.  Evaluate for DVT.  EXAM: BILATERAL LOWER EXTREMITY VENOUS DOPPLER ULTRASOUND  TECHNIQUE: Gray-scale sonography with graded compression, as well as color Doppler and duplex ultrasound were performed to evaluate the lower extremity deep venous systems from the level of the common femoral vein and including the common femoral, femoral, profunda femoral, popliteal and calf veins including the posterior tibial, peroneal and gastrocnemius veins when visible. The superficial great saphenous vein was also interrogated. Spectral Doppler was  utilized to evaluate flow at rest and with distal augmentation maneuvers in the common femoral, femoral and popliteal veins.  COMPARISON:  None.  FINDINGS: RIGHT LOWER EXTREMITY  Common Femoral Vein: No evidence of thrombus. Normal compressibility, respiratory phasicity and response to augmentation.  Saphenofemoral Junction: No evidence of thrombus. Normal compressibility and flow on color Doppler imaging.  Profunda Femoral Vein: No evidence of thrombus. Normal compressibility and flow on color Doppler imaging.  Femoral Vein: No evidence of thrombus. Normal compressibility, respiratory phasicity and response to augmentation.  Popliteal Vein: No evidence of thrombus. Normal compressibility, respiratory phasicity and response to augmentation.  Calf Veins: No evidence of thrombus. Normal compressibility and flow on color Doppler imaging.  Superficial Great Saphenous Vein: No evidence of thrombus. Normal compressibility and flow on color Doppler imaging.  Venous Reflux:  None.  Other Findings:  None.  LEFT LOWER EXTREMITY  Common Femoral Vein: No evidence of thrombus. Normal compressibility, respiratory phasicity and response to augmentation.  Saphenofemoral Junction: No evidence of thrombus. Normal compressibility and flow on color Doppler imaging.  Profunda Femoral Vein: No evidence of thrombus. Normal compressibility and flow on color Doppler imaging.  Femoral Vein: No evidence of thrombus. Normal compressibility, respiratory phasicity and response to augmentation.  Popliteal Vein: No evidence of thrombus. Normal compressibility, respiratory phasicity and response to augmentation.  Calf Veins: No evidence of thrombus. Normal compressibility and flow on color Doppler imaging.  Superficial Great Saphenous Vein: No evidence of thrombus. Normal compressibility and flow on color Doppler imaging.  Venous Reflux:  None.  Other Findings:  None.  IMPRESSION: No evidence of DVT within either lower extremity.   Electronically  Signed   By: Sandi Mariscal M.D.   On: 11/15/2014 09:27   Dg Chest Port 1 View  11/16/2014   CLINICAL DATA:  Respiratory failure  EXAM: PORTABLE CHEST - 1 VIEW  COMPARISON:  11/15/2014  FINDINGS: The endotracheal tube tip is above the carina. There is a right arm PICC line with tip in the cavoatrial junction. Moderate cardiac enlargement noted. Bilateral pleural effusions are identified left greater than right. There is mild interstitial edema.  IMPRESSION: 1. CHF pattern appears similar to the previous exam.   Electronically Signed   By: Kerby Moors M.D.   On: 11/16/2014 09:32    Scheduled Meds: . antiseptic oral rinse  7 mL Mouth Rinse BID  . antiseptic oral rinse  7 mL Mouth Rinse QID  . aspirin  300 mg Rectal Daily  . budesonide (PULMICORT) nebulizer solution  0.25 mg Nebulization BID  . chlorhexidine gluconate  15 mL Mouth Rinse BID  . enoxaparin (LOVENOX) injection  40 mg Subcutaneous Q24H  . furosemide  10 mg Intravenous Q12H  . insulin aspart  0-15 Units Subcutaneous 6 times per day  . insulin glargine  7  Units Subcutaneous BID  . latanoprost  1 drop Both Eyes QHS  . levalbuterol  0.63 mg Nebulization Q6H  . pantoprazole (PROTONIX) IV  40 mg Intravenous Daily  . piperacillin-tazobactam (ZOSYN)  IV  3.375 g Intravenous Q8H  . sodium chloride  10-40 mL Intracatheter Q12H  . vancomycin  750 mg Intravenous Q12H   Continuous Infusions: . 0.9 % NaCl with KCl 20 mEq / L 70 mL/hr at 11/16/14 2317  . amiodarone 30 mg/hr (11/16/14 2316)  . fentaNYL infusion INTRAVENOUS 100 mcg/hr (11/16/14 1937)  . midazolam (VERSED) infusion 1 mg/hr (11/16/14 1637)  . phenylephrine (NEO-SYNEPHRINE) Adult infusion Stopped (11/15/14 1730)    Assessment and plan:  Principal Problem:   HCAP (healthcare-associated pneumonia) Active Problems:   Acute exacerbation of chronic obstructive pulmonary disease (COPD)   Acute on chronic respiratory failure with hypercapnia   Hypotension   Chronic combined  systolic and diastolic congestive heart failure   Ischemic cardiomyopathy   Atrial flutter   AAA (abdominal aortic aneurysm) without rupture   Essential hypertension   Hyperglycemia, drug-induced   Obesity   Anemia, iron deficiency   Vitamin B12 deficiency    1. Acute encephalopathy with worsening pulmonary status-acute on chronic respiratory failure now requiring intubation for airway protection. On morning of 9/19 patient complained of shortness of breath. Her heart rate was in the 140s secondary to A. fib/A flutter. She became obtunded and subsequently unresponsive. Her oxygen saturation fell to the 80s on 4 L of nasal cannula oxygen. She became hypotensive which was thought to be secondary to IV diltiazem. Dr. Luan Pulling was consulted. She was eventually intubated and started on a Neo-Synephrine drip for hypotension. She was continued on antibiotics, steroid, and nebulizers. -Her follow-up chest x-ray after intubation did not reveal worsening infiltrates or edema. ABG following intubation on 11/14/14 revealed a pH of 7.3 and a PCO2 of 60. -The change in her respiratory status and mental status are unclear as the patient had been improving clinically. She had been given clonazepam shortly before she decompensated, but this is a chronic medication for her. -Investigational studies ordered: CT of the head revealed no acute intracranial findings. Echocardiogram revealed an EF of 30-35% and pulmonary hypertension. Troponin I marginally elevated, secondary to demand ischemia and not ACS. Bilateral lower extremity venous duplex revealed no evidence of DVT. Abdominal ultrasound revealed progression of AAA, but no rupture. -Patient's ABG is better, but she failed a weaning trial on 9/21. -Further recommendations per Dr. Luan Pulling.   Hypotension. The patient became hypotensive following intubation and IV diltiazem on 9/19. I do not believe the hypotension was from sepsis, but likely from oral diltiazem  superimposed on IV diltiazem given to try to decrease her heart rate. She was also likely hypotensive from intubation itself. -She was started on Neo-Synephrine with improvement. She was given a bolus of IV fluids. -Her blood pressure has improved and the Neo-Synephrine has been titrated off. We'll continue gentle IV fluids for support, but will decrease the rate given her low EF.   Healthcare acquired pneumonia. On admission, the patient's chest x-ray revealed right mid to lower lung pneumonia/pneumonitis. Her white blood cell count was elevated at 14.2. She was afebrile on admission. She was started on Zosyn and vancomycin. Her white blood cell count did increase secondary to IV steroids. -Her follow-up chest x-rays reveal vascular congestion and decrease bibasilar infiltrates, so there did not appear to be any worsening of her pneumonia radiographically. -Follow-up chest x-ray revealed CHF pattern with mild  interstitial edema. -The patient's white blood cell count has normalized. She remains afebrile. -We'll continue current management, but consider narrowing antibiotic therapy in the next 24-48 hours.  Ischemic cardiomyopathy with chronic combined heart failure; with mild acute decompensation. Echocardiogram February 2016 revealed an EF of 40-45% and grade 1 diastolic dysfunction. Patient's follow-up chest x-rays revealed minimal right lung interstitial edema and small bilateral pleural effusions; therefore in light of IV fluid resuscitation, she may have developed mild acute on chronic combined heart failure. -On admission, she was continued on Lasix, Plavix, isosorbide mononitrate, and pravastatin-they are being held due to intubation. Per rectal aspirin was started. [facility] consult cardiology for a discussion about anticoagulation. -Her BNP was 1064 on 9/21. -We'll start low-dose Lasix. Will decrease the maintenance IV fluids.  Acute COPD exacerbation causing acute on chronic respiratory  failure with hypercapnia. Patient is treated chronically with 4-5 L of oxygen 24/7. Patient's ABG on admission revealed pH of 7.35, PCO2 of 64, and PO2 of 108. She was started on BiPAP and her oxygen was titrated accordingly. IV Solu-Medrol and duo nebulizers were started. Symbicort was restarted. -As stated above, the patient decompensated and had to be intubated.  -Albuterol was discontinued due to tachyarrhythmias. She was subsequently started on Xopenex. Solu-Medrol was increased to 80 mg every 6 hours. -Further adjustments in ventilator settings as recommended by Dr. Luan Pulling. The patient failed the weaning trial on 9/21. -There are no significant bronchospasms on my exam, so we'll decrease the Solu-Medrol.  History of tachyarrhythmias/atrial flutter Per review of the patient's chart, she has a history of V. Tach, PAF and SVT. Her EKG on admission revealed atrial flutter. She was continued on amiodarone and diltiazem for treatment. When she became more tachycardic on 9/19, she was given 10 mg of IV diltiazem. It was unsuccessful. Following intubation, she was started on an amiodarone drip after I briefly discussed this with Dr. Harrington Challenger. Her troponin I was slightly elevated, likely from demand ischemia and from rapid atrial flutter/fib. -Her TSH was within normal limits. -Her 2-D echocardiogram revealed an EF of 30-35%. She is treated chronically with Plavix. I'm not sure why she wasn't started on anticoagulation for previous EF of 40-45%. We'll continue prophylactic Lovenox and per rectum aspirin. -Her heart rate has improved on the amiodarone drip, but is trending downward. -We will officially consult cardiology for management considerations regarding amiodarone drip and chronic anticoagulation.  Hyperglycemia, likely steroid-induced. Sliding scale NovoLog and Lantus were started. Her CBGs are trending up on sliding scale NovoLog. Sliding scale NovoLog was changed to every 4 hours. Lantus was  started and increased slightly.  -Her CBGs have been reasonable over the past 24 hours.  Normocytic anemia; iron deficiency; vitamin B12 deficiency.  Patient's hemoglobin ranges from 10.0-11.0.  Iron studies revealed a low ferritin of 10, low iron of 10, TIBC of 357, and low vitamin B12 of 178. Total iron, ferritin, and vitamin B12 ordered and are pending. Her TSH was within normal limits. -Vitamin B12 injections daily 3 days ordered. We'll hold off on iron infusion and packed red blood cells as her hemoglobin is more or less stable.  History of AAA. Patient has a history of abdominal aortic aneurysm. She is followed by [facility] cardiology and vascular surgery. Apparently there was an outpatient abdominal aortic aneurysm duplex December 2015 per chart review. I could not pull up the results. -Ultrasound of the abdomen was ordered on 9/20. The results revealed an increase in the aneurysm to 5.1 cm compared to 2011  when it measured 3.5 cm. There was no evidence of rupture. -We'll continue to monitor and refer back to cardiology/vascular surgery following discharge.   Mild hypokalemia. We'll give for IV runs of potassium chloride. Once tube feedings are started, could give potassium via NG tube.  DVT prophylaxis with Lovenox. GI prophylaxis with Protonix. Will consult registered dietitian to start trickle tube feedings.     Time spent: 35 minutes of critical care time.    [place] [facility] Pager [phone]. If 7PM-7AM, please contact night-coverage at [email or web address], password [facility] 11/17/2014, 8:40 AM  LOS: 6 days

## 2014-11-17 NOTE — Progress Notes (Signed)
Subjective: She remains intubated and on the ventilator. She failed weaning yesterday. She is undergoing wakeup assessment now and is responsive.  Objective: Vital signs in last 24 hours: Temp:  [97.4 F (36.3 C)-98.2 F (36.8 C)] 97.5 F (36.4 C) (09/22 0400) Pulse Rate:  [38-113] 63 (09/22 0800) Resp:  [15-21] 16 (09/22 0800) BP: (73-139)/(42-85) 106/71 mmHg (09/22 0800) SpO2:  [94 %-99 %] 98 % (09/22 0817) FiO2 (%):  [40 %] 40 % (09/22 0817) Weight:  [82.2 kg (181 lb 3.5 oz)] 82.2 kg (181 lb 3.5 oz) (09/22 0500) Weight change: 2.5 kg (5 lb 8.2 oz) Last BM Date: 11/16/2014  Intake/Output from previous day: 09/21 0701 - 09/22 0700 In: 2945.1 [I.V.:2345.1; IV Piggyback:600] Out: 3100 [Urine:3100]  PHYSICAL EXAM General appearance: Intubated and sedated but coming off sedation now. Resp: rhonchi bilaterally Cardio: irregularly irregular rhythm GI: soft, non-tender; bowel sounds normal; no masses,  no organomegaly Extremities: She still has some diffuse edema  Lab Results:  Results for orders placed or performed during the hospital encounter of 11/20/2014 (from the past 48 hour(s))  Lactic acid, plasma     Status: None   Collection Time: 11/15/14  9:12 AM  Result Value Ref Range   Lactic Acid, Venous 1.3 0.5 - 2.0 mmol/L  Glucose, capillary     Status: Abnormal   Collection Time: 11/15/14 11:30 AM  Result Value Ref Range   Glucose-Capillary 108 (H) 65 - 99 mg/dL   Comment 1 Notify RN    Comment 2 Document in Chart   Vancomycin, trough     Status: None   Collection Time: 11/15/14 12:27 PM  Result Value Ref Range   Vancomycin Tr 17 10.0 - 20.0 ug/mL  Glucose, capillary     Status: Abnormal   Collection Time: 11/15/14  5:03 PM  Result Value Ref Range   Glucose-Capillary 118 (H) 65 - 99 mg/dL   Comment 1 Notify RN    Comment 2 Document in Chart   Glucose, capillary     Status: Abnormal   Collection Time: 11/15/14  8:36 PM  Result Value Ref Range   Glucose-Capillary 120  (H) 65 - 99 mg/dL  Glucose, capillary     Status: Abnormal   Collection Time: 11/16/14 12:29 AM  Result Value Ref Range   Glucose-Capillary 122 (H) 65 - 99 mg/dL  Glucose, capillary     Status: Abnormal   Collection Time: 11/16/14  4:05 AM  Result Value Ref Range   Glucose-Capillary 109 (H) 65 - 99 mg/dL  Basic metabolic panel     Status: Abnormal   Collection Time: 11/16/14  4:15 AM  Result Value Ref Range   Sodium 140 135 - 145 mmol/L   Potassium 3.5 3.5 - 5.1 mmol/L   Chloride 102 101 - 111 mmol/L   CO2 32 22 - 32 mmol/L   Glucose, Bld 109 (H) 65 - 99 mg/dL   BUN 18 6 - 20 mg/dL   Creatinine, Ser 0.54 0.44 - 1.00 mg/dL   Calcium 7.8 (L) 8.9 - 10.3 mg/dL   GFR calc non Af Amer >60 >60 mL/min   GFR calc Af Amer >60 >60 mL/min    Comment: (NOTE) The eGFR has been calculated using the CKD EPI equation. This calculation has not been validated in all clinical situations. eGFR's persistently <60 mL/min signify possible Chronic Kidney Disease.    Anion gap 6 5 - 15  CBC     Status: Abnormal   Collection Time: 11/16/14  4:15 AM  Result Value Ref Range   WBC 6.0 4.0 - 10.5 K/uL   RBC 3.46 (L) 3.87 - 5.11 MIL/uL   Hemoglobin 9.5 (L) 12.0 - 15.0 g/dL   HCT 31.3 (L) 36.0 - 46.0 %   MCV 90.5 78.0 - 100.0 fL   MCH 27.5 26.0 - 34.0 pg   MCHC 30.4 30.0 - 36.0 g/dL   RDW 16.2 (H) 11.5 - 15.5 %   Platelets 224 150 - 400 K/uL  Brain natriuretic peptide     Status: Abnormal   Collection Time: 11/16/14  4:15 AM  Result Value Ref Range   B Natriuretic Peptide 1064.0 (H) 0.0 - 100.0 pg/mL  Blood gas, arterial     Status: Abnormal   Collection Time: 11/16/14  6:10 AM  Result Value Ref Range   FIO2 40.00    Delivery systems VENTILATOR    Mode PRESSURE REGULATED VOLUME CONTROL    VT 420 mL   LHR 16.0 resp/min   Peep/cpap 5.0 cm H20   pH, Arterial 7.428 7.350 - 7.450   pCO2 arterial 46.8 (H) 35.0 - 45.0 mmHg   pO2, Arterial 81.8 80.0 - 100.0 mmHg   Bicarbonate 29.6 (H) 20.0 - 24.0  mEq/L   TCO2 12.1 0 - 100 mmol/L   Acid-Base Excess 6.0 (H) 0.0 - 2.0 mmol/L   O2 Saturation 94.2 %   Collection site LEFT BRACHIAL    Drawn by [id number]    Sample type ARTERIAL    Allens test (pass/fail) NOT INDICATED (A) PASS  Glucose, capillary     Status: Abnormal   Collection Time: 11/16/14  7:54 AM  Result Value Ref Range   Glucose-Capillary 104 (H) 65 - 99 mg/dL   Comment 1 Notify RN    Comment 2 Document in Chart   Blood gas, arterial     Status: Abnormal   Collection Time: 11/16/14  8:51 AM  Result Value Ref Range   FIO2 40.00    Delivery systems VENTILATOR    Mode PRESSURE REGULATED VOLUME CONTROL    Peep/cpap 5.0 cm H20   Pressure support 5 cm H20   pH, Arterial 7.248 (L) 7.350 - 7.450   pCO2 arterial 72.4 (HH) 35.0 - 45.0 mmHg    Comment: CRITICAL RESULT CALLED TO, READ BACK BY AND VERIFIED WITH: LEIGHANN SCHONEWITZ RN BY ROBIN POWELL RRT AT 0855 ON 11/16/14    pO2, Arterial 69.2 (L) 80.0 - 100.0 mmHg   Bicarbonate 26.2 (H) 20.0 - 24.0 mEq/L   Acid-Base Excess 3.8 (H) 0.0 - 2.0 mmol/L   O2 Saturation 86.0 %   Collection site LEFT RADIAL    Drawn by [id number]    Sample type ARTHROGRAPHIS SPECIES    Allens test (pass/fail) PASS PASS  Glucose, capillary     Status: Abnormal   Collection Time: 11/16/14 11:41 AM  Result Value Ref Range   Glucose-Capillary 108 (H) 65 - 99 mg/dL   Comment 1 Notify RN    Comment 2 Document in Chart   Glucose, capillary     Status: Abnormal   Collection Time: 11/16/14  4:33 PM  Result Value Ref Range   Glucose-Capillary 125 (H) 65 - 99 mg/dL   Comment 1 Notify RN    Comment 2 Document in Chart   Glucose, capillary     Status: Abnormal   Collection Time: 11/16/14  7:41 PM  Result Value Ref Range   Glucose-Capillary 106 (H) 65 - 99 mg/dL   Comment 1 Notify RN  Glucose, capillary     Status: Abnormal   Collection Time: 11/16/14 11:23 PM  Result Value Ref Range   Glucose-Capillary 116 (H) 65 - 99 mg/dL   Comment 1 Notify RN   Glucose,  capillary     Status: Abnormal   Collection Time: 11/17/14  4:12 AM  Result Value Ref Range   Glucose-Capillary 116 (H) 65 - 99 mg/dL   Comment 1 Notify RN   Blood gas, arterial     Status: Abnormal   Collection Time: 11/17/14  4:30 AM  Result Value Ref Range   FIO2 40.00    Delivery systems VENTILATOR    Mode PRESSURE REGULATED VOLUME CONTROL    VT 420 mL   LHR 16.0 resp/min   Peep/cpap 5.0 cm H20   pH, Arterial 7.438 7.350 - 7.450   pCO2 arterial 47.0 (H) 35.0 - 45.0 mmHg   pO2, Arterial 75.9 (L) 80.0 - 100.0 mmHg   Bicarbonate 30.4 (H) 20.0 - 24.0 mEq/L   TCO2 12.1 0 - 100 mmol/L   Acid-Base Excess 6.9 (H) 0.0 - 2.0 mmol/L   O2 Saturation 93.3 %   Collection site LEFT BRACHIAL    Drawn by [id number]    Sample type ARTERIAL    Allens test (pass/fail) NOT INDICATED (A) PASS  Basic metabolic panel     Status: Abnormal   Collection Time: 11/17/14  4:40 AM  Result Value Ref Range   Sodium 142 135 - 145 mmol/L   Potassium 3.2 (L) 3.5 - 5.1 mmol/L   Chloride 103 101 - 111 mmol/L   CO2 33 (H) 22 - 32 mmol/L   Glucose, Bld 115 (H) 65 - 99 mg/dL   BUN 17 6 - 20 mg/dL   Creatinine, Ser 0.50 0.44 - 1.00 mg/dL   Calcium 7.6 (L) 8.9 - 10.3 mg/dL   GFR calc non Af Amer >60 >60 mL/min   GFR calc Af Amer >60 >60 mL/min    Comment: (NOTE) The eGFR has been calculated using the CKD EPI equation. This calculation has not been validated in all clinical situations. eGFR's persistently <60 mL/min signify possible Chronic Kidney Disease.    Anion gap 6 5 - 15  CBC     Status: Abnormal   Collection Time: 11/17/14  4:40 AM  Result Value Ref Range   WBC 5.7 4.0 - 10.5 K/uL   RBC 3.54 (L) 3.87 - 5.11 MIL/uL   Hemoglobin 9.6 (L) 12.0 - 15.0 g/dL   HCT 31.9 (L) 36.0 - 46.0 %   MCV 90.1 78.0 - 100.0 fL   MCH 27.1 26.0 - 34.0 pg   MCHC 30.1 30.0 - 36.0 g/dL   RDW 16.0 (H) 11.5 - 15.5 %   Platelets 242 150 - 400 K/uL  Glucose, capillary     Status: Abnormal   Collection Time: 11/17/14  7:29 AM   Result Value Ref Range   Glucose-Capillary 122 (H) 65 - 99 mg/dL   Comment 1 Notify RN    Comment 2 Document in Chart     ABGS  Recent Labs  11/17/14 0430  PHART 7.438  PO2ART 75.9*  TCO2 12.1  HCO3 30.4*   CULTURES Recent Results (from the past 240 hour(s))  MRSA PCR Screening     Status: None   Collection Time: 10/28/2014 11:59 PM  Result Value Ref Range Status   MRSA by PCR NEGATIVE NEGATIVE Final    Comment:        The GeneXpert MRSA Assay (FDA  approved for NASAL specimens only), is one component of a comprehensive MRSA colonization surveillance program. It is not intended to diagnose MRSA infection nor to guide or monitor treatment for MRSA infections.    Studies/Results: Ct Head Wo Contrast  11/15/2014   CLINICAL DATA:  Acute encephalopathy.  Ventilated patient.  EXAM: CT HEAD WITHOUT CONTRAST  TECHNIQUE: Contiguous axial images were obtained from the base of the skull through the vertex without intravenous contrast.  COMPARISON:  None.  FINDINGS: No mass effect or midline shift. No evidence of acute intracranial hemorrhage, or infarction. No abnormal extra-axial fluid collections. Gray-white matter differentiation is normal. Basal cisterns are preserved. There is atrophy and chronic small vessel disease changes.  No depressed skull fractures. Visualized paranasal sinuses and mastoid air cells are not opacified. There is a partially visualized catheter within the left nasal cavity.  IMPRESSION: No acute intracranial abnormality.  Atrophy, chronic microvascular disease.   Electronically Signed   By: Fidela Salisbury M.D.   On: 11/15/2014 10:46   US Abdomen Complete  11/15/2014   CLINICAL DATA:  Known abdominal aortic aneurysm  EXAM: ULTRASOUND ABDOMEN COMPLETE  COMPARISON:  None.  FINDINGS: Gallbladder: No gallstones or wall thickening visualized. No sonographic Murphy sign noted.  Common bile duct: Diameter: 3.2 mm.  Liver: Increased echogenicity consistent with fatty  infiltration.  IVC: No abnormality visualized.  Pancreas: Not well visualized  Spleen: Size and appearance within normal limits.  Right Kidney: Length: 11.4 cm. Echogenicity within normal limits. No mass or hydronephrosis visualized.  Left Kidney: Length: 11.3 cm. Echogenicity within normal limits. No mass or hydronephrosis visualized.  Abdominal aorta: There is aneurysmal dilatation of the mid to distal aorta to 5.1 cm. This has increased in size from 2011 at which time it measured 3.5 cm.  Other findings: None.  IMPRESSION: Increase in size in of abdominal aorta to 5.1 cm. Recommend followup by abdomen and pelvis CTA in 3-6 months, and vascular surgery referral/consultation if not already obtained. This recommendation follows ACR consensus guidelines: White Paper of the ACR Incidental Findings Committee II on Vascular Findings. J Am Coll Radiol 2013; 10:789-794.  Fatty liver.  No other focal abnormality is noted.   Electronically Signed   By: Inez Catalina M.D.   On: 11/15/2014 09:52   US Venous Img Lower Bilateral  11/15/2014   CLINICAL DATA:  Respiratory failure.  Evaluate for DVT.  EXAM: BILATERAL LOWER EXTREMITY VENOUS DOPPLER ULTRASOUND  TECHNIQUE: Gray-scale sonography with graded compression, as well as color Doppler and duplex ultrasound were performed to evaluate the lower extremity deep venous systems from the level of the common femoral vein and including the common femoral, femoral, profunda femoral, popliteal and calf veins including the posterior tibial, peroneal and gastrocnemius veins when visible. The superficial great saphenous vein was also interrogated. Spectral Doppler was utilized to evaluate flow at rest and with distal augmentation maneuvers in the common femoral, femoral and popliteal veins.  COMPARISON:  None.  FINDINGS: RIGHT LOWER EXTREMITY  Common Femoral Vein: No evidence of thrombus. Normal compressibility, respiratory phasicity and response to augmentation.  Saphenofemoral  Junction: No evidence of thrombus. Normal compressibility and flow on color Doppler imaging.  Profunda Femoral Vein: No evidence of thrombus. Normal compressibility and flow on color Doppler imaging.  Femoral Vein: No evidence of thrombus. Normal compressibility, respiratory phasicity and response to augmentation.  Popliteal Vein: No evidence of thrombus. Normal compressibility, respiratory phasicity and response to augmentation.  Calf Veins: No evidence of thrombus. Normal compressibility and  flow on color Doppler imaging.  Superficial Great Saphenous Vein: No evidence of thrombus. Normal compressibility and flow on color Doppler imaging.  Venous Reflux:  None.  Other Findings:  None.  LEFT LOWER EXTREMITY  Common Femoral Vein: No evidence of thrombus. Normal compressibility, respiratory phasicity and response to augmentation.  Saphenofemoral Junction: No evidence of thrombus. Normal compressibility and flow on color Doppler imaging.  Profunda Femoral Vein: No evidence of thrombus. Normal compressibility and flow on color Doppler imaging.  Femoral Vein: No evidence of thrombus. Normal compressibility, respiratory phasicity and response to augmentation.  Popliteal Vein: No evidence of thrombus. Normal compressibility, respiratory phasicity and response to augmentation.  Calf Veins: No evidence of thrombus. Normal compressibility and flow on color Doppler imaging.  Superficial Great Saphenous Vein: No evidence of thrombus. Normal compressibility and flow on color Doppler imaging.  Venous Reflux:  None.  Other Findings:  None.  IMPRESSION: No evidence of DVT within either lower extremity.   Electronically Signed   By: Sandi Mariscal M.D.   On: 11/15/2014 09:27   Dg Chest Port 1 View  11/16/2014   CLINICAL DATA:  Respiratory failure  EXAM: PORTABLE CHEST - 1 VIEW  COMPARISON:  11/15/2014  FINDINGS: The endotracheal tube tip is above the carina. There is a right arm PICC line with tip in the cavoatrial junction.  Moderate cardiac enlargement noted. Bilateral pleural effusions are identified left greater than right. There is mild interstitial edema.  IMPRESSION: 1. CHF pattern appears similar to the previous exam.   Electronically Signed   By: Kerby Moors M.D.   On: 11/16/2014 09:32    Medications:  Prior to Admission:  Prescriptions prior to admission  Medication Sig Dispense Refill Last Dose  . acetaminophen-codeine (TYLENOL #3) 300-30 MG per tablet Take 1 tablet by mouth every 4 (four) hours as needed for moderate pain.   unknown  . amiodarone (PACERONE) 200 MG tablet Take 200 mg by mouth 2 (two) times daily.    11/05/2014 at Unknown time  . aspirin EC 81 MG tablet Take 81 mg by mouth at bedtime.   11/10/2014 at Unknown time  . budesonide-formoterol (SYMBICORT) 160-4.5 MCG/ACT inhaler Inhale 2 puffs into the lungs 2 (two) times daily. 1 Inhaler 6 11/14/2014 at Unknown time  . clonazePAM (KLONOPIN) 0.5 MG tablet Take 0.25-0.5 mg by mouth every 6 (six) hours as needed for anxiety.    10/31/2014 at Unknown time  . clopidogrel (PLAVIX) 75 MG tablet Take 1 tablet (75 mg total) by mouth at bedtime.   11/10/2014 at Unknown time  . diltiazem (CARDIZEM CD) 300 MG 24 hr capsule TAKE 1 CAPSULE IN THE EVENING. 30 capsule 1 11/10/2014 at Unknown time  . furosemide (LASIX) 40 MG tablet Take 1 tablet (40 mg total) by mouth daily. (Patient taking differently: Take 40 mg by mouth every morning. ) 30 tablet 3 11/20/2014 at Unknown time  . guaiFENesin (MUCINEX) 600 MG 12 hr tablet Take 1 tablet by mouth every 12 hours as needed for cough and congestion   11/10/2014 at Unknown time  . ipratropium (ATROVENT) 0.02 % nebulizer solution USE 1 VIAL IN NEBULIZER EVERY 6 HOURS. (Patient taking differently: USE 1 VIAL IN NEBULIZER EVERY 4 HOURS FOR SHORTNESS OF BREATH/WHEEZING) 300 mL 1 UNKNOWN  . isosorbide mononitrate (IMDUR) 30 MG 24 hr tablet TAKE ONE TABLET BY MOUTH AT BEDTIME. (Patient taking differently: TAKE ONE TABLET BY MOUTH  EVERY MORNING) 30 tablet 5 11/23/2014 at Unknown time  . latanoprost (XALATAN)  0.005 % ophthalmic solution Place 1 drop into both eyes at bedtime.   10/30/2014  . levalbuterol (XOPENEX HFA) 45 MCG/ACT inhaler Inhale 2 puffs into the lungs every 4 (four) hours as needed for wheezing. (Patient taking differently: Inhale 2 puffs into the lungs every 4 (four) hours as needed for wheezing or shortness of breath. ) 15 g 6 Past Month at Unknown time  . mirtazapine (REMERON) 15 MG tablet Take 7.5 mg by mouth at bedtime. Take 1/2 tablet by mouth at bedtime   11/10/2014 at Unknown time  . nitroGLYCERIN (NITROSTAT) 0.4 MG SL tablet Place 0.4 mg under the tongue every 5 (five) minutes as needed for chest pain (may repeat x3). Chest pain   11/10/2014 at Unknown time  . nystatin (MYCOSTATIN) 100000 UNIT/ML suspension Take 5 mLs by mouth 4 (four) times daily.    10/27/2014 at Unknown time  . omeprazole (PRILOSEC) 20 MG capsule Take 20 mg by mouth daily as needed (for acid reflux).    unknown  . ondansetron (ZOFRAN) 8 MG tablet TAKE 1/2 TABLET ONCE DAILY AS NEEDED FOR NAUSEA   11/12/2014 at Unknown time  . OXYGEN Inhale 4.5 L into the lungs continuous.    11/01/2014 at Unknown time  . potassium chloride SA (K-DUR,KLOR-CON) 20 MEQ tablet Take 2 tablets by mouth every morning   10/27/2014 at Unknown time  . pravastatin (PRAVACHOL) 80 MG tablet Take 80 mg by mouth at bedtime.   11/10/2014 at Unknown time  . ranitidine (ZANTAC) 150 MG capsule Take 150 mg by mouth daily as needed for heartburn.    unknown at Unknown time  . tamsulosin (FLOMAX) 0.4 MG CAPS capsule TAKE 1 CAPSULE AT BEDTIME FOR INCONTINENCE.   11/10/2014 at Unknown time   Scheduled: . antiseptic oral rinse  7 mL Mouth Rinse BID  . antiseptic oral rinse  7 mL Mouth Rinse QID  . aspirin  300 mg Rectal Daily  . budesonide (PULMICORT) nebulizer solution  0.25 mg Nebulization BID  . chlorhexidine gluconate  15 mL Mouth Rinse BID  . enoxaparin (LOVENOX) injection  40  mg Subcutaneous Q24H  . furosemide  10 mg Intravenous Q12H  . insulin aspart  0-15 Units Subcutaneous 6 times per day  . insulin glargine  7 Units Subcutaneous BID  . latanoprost  1 drop Both Eyes QHS  . levalbuterol  0.63 mg Nebulization Q6H  . pantoprazole (PROTONIX) IV  40 mg Intravenous Daily  . piperacillin-tazobactam (ZOSYN)  IV  3.375 g Intravenous Q8H  . sodium chloride  10-40 mL Intracatheter Q12H  . vancomycin  750 mg Intravenous Q12H   Continuous: . 0.9 % NaCl with KCl 20 mEq / L 70 mL/hr at 11/16/14 2317  . amiodarone 30 mg/hr (11/16/14 2316)  . fentaNYL infusion INTRAVENOUS 100 mcg/hr (11/16/14 1937)  . midazolam (VERSED) infusion 1 mg/hr (11/16/14 1637)  . phenylephrine (NEO-SYNEPHRINE) Adult infusion Stopped (11/15/14 1730)   TGG:YIRSWNIOEVOJJ, fentaNYL (SUBLIMAZE) injection, fentaNYL (SUBLIMAZE) injection, ipratropium, midazolam, midazolam, ondansetron **OR** ondansetron (ZOFRAN) IV, sodium chloride  Assesment: She was admitted with healthcare associated pneumonia and then developed acute on chronic hypercapnic respiratory failure requiring intubation and ventilator support. She has chronic combined systolic and diastolic heart failure at baseline and has pretty severe COPD at baseline. She had atrial fibrillation during her acute event. She is on amiodarone. She failed weaning yesterday. Principal Problem:   HCAP (healthcare-associated pneumonia) Active Problems:   Essential hypertension   Acute exacerbation of chronic obstructive pulmonary disease (COPD)  Acute on chronic respiratory failure with hypercapnia   Chronic combined systolic and diastolic congestive heart failure   Ischemic cardiomyopathy   Hyperglycemia, drug-induced   Atrial flutter   Obesity   Hypotension   Anemia, iron deficiency   Vitamin B12 deficiency   AAA (abdominal aortic aneurysm) without rupture    Plan: Weaning trial today    LOS: 6 days   HAWKINS,EDWARD L 11/17/2014, 8:36  AM

## 2014-11-17 NOTE — Consult Note (Signed)
CARDIOLOGY CONSULT NOTE   Patient ID: [name] [name] MRN: [id number] DOB/AGE: 03-11-1940 74 y.o.  Admit Date: 10/27/2014 Referring Physician: PTH-Fisher Primary Physician: [name] Mustache, MD Consulting Cardiologist: [name] Dolly MD Primary Cardiologist: [name] Ruths MD  Reason for Consultation:CHF and Atrial flutter   Clinical Summary [name] [name] is a medically complex 74 y.o.female with known history of CAD with most recent hospitalization in 04/15/2014 requiring cardiac cath in the setting of chest pain. Cardiac cath demonstrated a totally occluded ostial LAD with left to left collaterals, small nondominant RCA, a large complex abdominal aortic aneurysm with severe distal aortic disease and probable high-grade ostial disease in the bilateral iliacs. She had atherectomy/PTCA/stenting of the left circumflex.  2-D echo EF 40-45%. Now with ICM.Other cardiac history includes SVT treated with amiodarone, hypertension, O2 dependent COPD, Cerebral vascular disease, and bladder cancer.   She presented to ER with complaints of progressive dyspnea. She was placed on BiPAP an started on IV antibiotics. BP 107/69 HR 123 bpm. O2 Sat 100% on BiPAP.She was afebrile. CXR demonstrated infiltrate to the mid to lower lungs concerning for pneumonia./pneumonitis. EKG demonstrated atrial flutter with RBBB.   Due to severe respiratory distress, she was intubated on 11/14/2014. She has been diagnosed with Healthcare associated pneumonia. Echo has been repeated. EF of 30-35%, moderately dilated LA. She remained in atrial flutter and was placed on an amiodarone gtt with good HR control. She was also temporarily hypotensive at the time of intubation as was placed on Neo-Synephrine gtt which is now stopped.   Follow up ultrasound of AAA was also completed and found an increase in AAA size from 4.5 cm in July  to 5.1 cm on ultrasound completed on 11/15/2014.   The patient is intubated and sedated. Hx is obtained  from current and past records and with husband at bedside.     Allergies  Allergen Reactions  . Albuterol Other (See Comments)    Jittery/shakiness  . Levaquin [Levofloxacin In D5w] Nausea Only  . Sulfa Antibiotics Nausea Only    Medications Scheduled Medications: . antiseptic oral rinse  7 mL Mouth Rinse BID  . antiseptic oral rinse  7 mL Mouth Rinse QID  . aspirin  300 mg Rectal Daily  . budesonide (PULMICORT) nebulizer solution  0.25 mg Nebulization BID  . chlorhexidine gluconate  15 mL Mouth Rinse BID  . enoxaparin (LOVENOX) injection  40 mg Subcutaneous Q24H  . furosemide  20 mg Intravenous Daily  . insulin aspart  0-15 Units Subcutaneous 6 times per day  . insulin glargine  7 Units Subcutaneous BID  . latanoprost  1 drop Both Eyes QHS  . levalbuterol  0.63 mg Nebulization Q6H  . methylPREDNISolone (SOLU-MEDROL) injection  60 mg Intravenous Q12H  . pantoprazole (PROTONIX) IV  40 mg Intravenous Daily  . piperacillin-tazobactam (ZOSYN)  IV  3.375 g Intravenous Q8H  . potassium chloride  10 mEq Intravenous Q1 Hr x 4  . sodium chloride  10-40 mL Intracatheter Q12H  . vancomycin  750 mg Intravenous Q12H    Infusions: . 0.9 % NaCl with KCl 20 mEq / L 50 mL/hr at 11/17/14 1244  . amiodarone 30 mg/hr (11/16/14 2316)  . fentaNYL infusion INTRAVENOUS 100 mcg/hr (11/16/14 1937)  . midazolam (VERSED) infusion 1 mg/hr (11/16/14 1637)  . phenylephrine (NEO-SYNEPHRINE) Adult infusion Stopped (11/15/14 1730)    PRN Medications: acetaminophen, fentaNYL (SUBLIMAZE) injection, fentaNYL (SUBLIMAZE) injection, ipratropium, midazolam, midazolam, ondansetron **OR** ondansetron (ZOFRAN) IV, sodium chloride   Past Medical  History  Diagnosis Date  . Hypertension   . Hyperlipidemia   . Cerebrovascular disease, unspecified   . Unspecified glaucoma   . Obesity, unspecified   . Coronary artery disease     cath 04/21/14:severe multivessel disease with a left dominant coronary system, chronic  occlusion of the LAD, and heavily calcified severe stenosis of the proximal left circumflex.  She underwent rotational atherectomy and stenting of the left circumflex  . Ventricular tachycardia   . SVT (supraventricular tachycardia)   . AAA (abdominal aortic aneurysm)   . COPD (chronic obstructive pulmonary disease) 2013    [place] of breath     WITH ACTIVITY--USES OXYGEN AS NEEDED  2&1/2 L PER NASAL CANNUL  . Complication of anesthesia     PT STATES SHE CAN NOT BE PUT TO SLEEP BECAUSE OF HER LUNG PROBLEMS  . Carotid artery occlusion   . Anemia   . CHF (congestive heart failure)   . Abdominal aneurysm     "hasn't been repaired" (10/20/2013)  . Pneumonia X ~ 2  . On home oxygen therapy     "4L; 24/7" (10/20/2013)  . GERD (gastroesophageal reflux disease)   . Migraines     "before tubal ligation I had them alot; seldom have one anymore" (10/20/2013)  . Arthritis     "knees mainly" (10/20/2013)  . Bladder cancer 2009  . Atrial fibrillation   . Bell palsy 2014    Past Surgical History  Procedure Laterality Date  . Cystoscopy    . Bladder tumor excision      resection of 5 bladder, and fulguration of 1 small bladder tumor  . Cystoscopy      cold cup bladder biopsy of five tumors, and fulguration of one tumor  . Wedge resection Right 1980's    Remote right lung  . Cystoscopy with biopsy  12/24/2011    Procedure: CYSTOSCOPY WITH BIOPSY;  Surgeon: [name] Bonine, MD;  Location: [facility];  Service: Urology;  Laterality: N/A;  . Tubal ligation Bilateral 1980's  . Cataract extraction w/ intraocular lens  implant, bilateral Bilateral ~ 2011  . Cardiac catheterization  "several"    "too bad to put stents in"  . Left heart catheterization with coronary angiogram N/A 04/18/2014    Procedure: LEFT HEART CATHETERIZATION WITH CORONARY ANGIOGRAM;  Surgeon: [name] Artist, MD;  Location: [facility] CATH LAB;  Service: Cardiovascular;  Laterality: N/A;  . Percutaneous  coronary rotoblator intervention (pci-r) N/A 04/21/2014    Procedure: PERCUTANEOUS CORONARY ROTOBLATOR INTERVENTION (PCI-R);  Surgeon: [name] Ohara, MD;  Location: [name] CATH LAB;  Service: Cardiovascular;  Laterality: N/A;    Family History  Problem Relation Age of Onset  . Heart disease Mother 81  . Heart disease Father   . Hyperlipidemia Father   . Hypertension Father   . Diabetes Daughter   . Peripheral vascular disease Daughter     Social History Ms. Kotecki reports that she quit smoking about 3 years ago. Her smoking use included Cigarettes. She has a 100 pack-year smoking history. She has never used smokeless tobacco. Ms. Seyer reports that she does not drink alcohol.  Review of Systems Complete review of systems are found to be negative unless outlined in H&P above.  Physical Examination Blood pressure 115/43, pulse 65, temperature 98.5 F (36.9 C), temperature source Axillary, resp. rate 16, height 5\' 3"  (1.6 m), weight 181 lb 3.5 oz (82.2 kg), SpO2 97 %.  Intake/Output Summary (Last 24 hours) at 11/17/14  [place] filed at 11/17/14 0937  Gross per 24 hour  Intake 3195.05 ml  Output   3100 ml  Net  95.05 ml    Telemetry:Atrial flutter   GEN: Intubated and sedated.  HEENT: Conjunctiva and lids normal, oropharynx clear with moist mucosa. Neck: Supple, no elevated JVP or carotid bruits, no thyromegaly. Lungs: Clear to auscultation in upper lobes, diminished in the bases, intubated, Cardiac: Irregular rate and rhythm, no S3 or significant systolic murmur, no pericardial rub. Abdomen: Soft, nontender, no hepatomegaly, bowel sounds present, no guarding or rebound. Extremities: No pitting edema, distal pulses absent, but found with doppler in the LE. Foot drop is noted. Skin: Warm and dry.Pale Neuropsychiatric: Sedated  Prior Cardiac Testing/Procedures 1. Echocardiogram 11/15/2014 Left ventricle: The cavity size was normal. Wall thickness was increased in a pattern  of mild LVH. Systolic function was moderately to severely reduced. The estimated ejection fraction was in the range of 30% to 35%. The study was not technically sufficient to allow evaluation of LV diastolic dysfunction due to atrial fibrillation. - Aortic valve: Mildly to moderately calcified annulus. Mildly thickened leaflets. Valve area (VTI): 2.06 cm^2. Valve area (Vmax): 1.86 cm^2. - Mitral valve: Mildly calcified annulus. Mildly thickened leaflets . There was mild to moderate regurgitation. The MR vena contracta is 0.4 cm. - Left atrium: The atrium was moderately dilated. - Right ventricle: Systolic function was mildly to moderately reduced. - Right atrium: The atrium was moderately dilated. - Pulmonary arteries: Systolic pressure was moderately increased. PA peak pressure: 45 mm Hg (S). - Inferior vena cava: The vessel was dilated. The respirophasic diameter changes were blunted (< 50%), consistent with elevated central venous pressure. - Technically difficult study.  Cardiac cath 04/18/2014 Ao Pressure: 132/62 (92) LV Pressure: 111/16/22 There was no signficant gradient across the aortic valve on pullback. Left main: Either absent or short. It appeared there was likely separate ostia for the LAD and LCX. LAD: Totally occluded ostially with left to left collaterals. Faint right to left collaterals.  LCX: Large dominant vessel. Heavily calcified. Proximal 90% lesion. There are three marginal branches with mild plaque and a small PDA. RCA: Small non-dominant vessel with high anterior take-off. No significant plaque. Faint right to left collaterals.  Abdomina aortogram = Large complex AAA with severe distal aortic disease and probable high-grade ostial disease in bilateral iliacs.   Assessment: 1. Severe 2-V calcific CAD as described above with totally occluded LAD and high grade proximal disease in dominant LCX 2. Severe PAD with complex AAA as  described above   Lab Results  Basic Metabolic Panel:  Recent Labs Lab 11/13/14 0442 11/14/14 0834 11/15/14 0415 11/16/14 0415 11/17/14 0440  NA 141 140 141 140 142  K 3.5 5.1 3.7 3.5 3.2*  CL 93* 95* 97* 102 103  CO2 40* 38* 36* 32 33*  GLUCOSE 147* 235* 112* 109* 115*  BUN 9 14 15 18 17   CREATININE 0.49 0.65 0.62 0.54 0.50  CALCIUM 8.4* 8.6* 7.8* 7.8* 7.6*    Liver Function Tests:  Recent Labs Lab 10/30/2014 1903 11/15/14 0415  AST 18 64*  ALT 11* 40  ALKPHOS 73 55  BILITOT 0.6 0.8  PROT 6.3* 5.5*  ALBUMIN 3.3* 2.8*    CBC:  Recent Labs Lab 11/23/2014 1903  11/13/14 0442 11/14/14 0834 11/15/14 0415 11/16/14 0415 11/17/14 0440  WBC 14.2*  < > 8.9 22.4* 15.3* 6.0 5.7  NEUTROABS 11.6*  --   --   --   --   --   --  HGB 10.7*  < > 9.1* 11.3* 10.1* 9.5* 9.6*  HCT 37.3  < > 31.2* 40.3 34.7* 31.3* 31.9*  MCV 94.0  < > 93.4 96.4 92.3 90.5 90.1  PLT 303  < > 229 454* 403* 224 242  < > = values in this interval not displayed.  Cardiac Enzymes:  Recent Labs Lab 11/14/14 0833 11/14/14 1425 11/14/14 2000  TROPONINI <0.03 0.05* 0.07*    Radiology: Ct Head Wo Contrast  11/15/2014   CLINICAL DATA:  Acute encephalopathy.  Ventilated patient.  EXAM: CT HEAD WITHOUT CONTRAST  TECHNIQUE: Contiguous axial images were obtained from the base of the skull through the vertex without intravenous contrast.  COMPARISON:  None.  FINDINGS: No mass effect or midline shift. No evidence of acute intracranial hemorrhage, or infarction. No abnormal extra-axial fluid collections. Gray-white matter differentiation is normal. Basal cisterns are preserved. There is atrophy and chronic small vessel disease changes.  No depressed skull fractures. Visualized paranasal sinuses and mastoid air cells are not opacified. There is a partially visualized catheter within the left nasal cavity.  IMPRESSION: No acute intracranial abnormality.  Atrophy, chronic microvascular disease.   Electronically  Signed   By: Fidela Salisbury M.D.   On: 11/15/2014 10:46   Dg Chest Port 1 View  11/16/2014   CLINICAL DATA:  Respiratory failure  EXAM: PORTABLE CHEST - 1 VIEW  COMPARISON:  11/15/2014  FINDINGS: The endotracheal tube tip is above the carina. There is a right arm PICC line with tip in the cavoatrial junction. Moderate cardiac enlargement noted. Bilateral pleural effusions are identified left greater than right. There is mild interstitial edema.  IMPRESSION: 1. CHF pattern appears similar to the previous exam.   Electronically Signed   By: Kerby Moors M.D.   On: 11/16/2014 09:32     ECG: Atrial flutter rate of 90 bpm.   Impression and Recommendations  1.ICM: Most recent echocardiogram completed this admission demonstrates EF of 30-35%. She not currently on diuretics or BB. She is positive on I/O currently and is receiving KVO fluids only. Initial troponin normal but small amount of elevation is seen at 0.07 on last draw 11/14/2014. She is currently not on ACE or BB. Is low normal   2. CAD: Most recent cardiac cath demonstrated totally occluded LAD and high grade proximal disease in dominant LCX. She has atherectomy/PTCA/stenting of the left circumflex per Dr. Burt Knack on 04/21/2014. She had been maintained on Plavix and ASA prior to admission and is on LMWH now only. She is not on BB currently.  3. Atrial flutter: She is now on amiodarone gtt and is maintaining HR. CHADS VASC Score is 4. I have reviewed prior notes and do not find reason that she is not on anticoagulation with the exception of anemia and husband about this and has no idea if there was a reason concerning this. Will discuss with Dr. Harl Bowie before recommending we start anticoagulation.  4. Mixed CHF: Last CXR demonstrated mild CHF. She has been receiving hydration with positive 5.5 liters noted as of today. She is not on diuretics at this time. Lung sounds are diminished on exam.   5. VDRF: Followed by Dr. Luan Pulling.   6. AAA:  She has had a mild increase in size from 4.5 cm in July 2016 to 5.1 cm this admission without evidence of dissection.   7  PAD: Illiac and carotid. She does not have palpable pulses but are found by doppler. Food drop is noted,will provide support .  8. Anemia; She has Hgb of 9.6 which is down slightly from 10.7 on admission. This may be dilutional from IV hydration. Would consider adding IV PPI as long as she is intubated.   9. Hypokalemia: Potassium 3.2 this am. She is receiving IV replacement. Will check a magnesium level  10. Community Acquired pneumonia: Treated with antibiotics.    Signed: Phill Myron. Lawrence NP [facility]  11/17/2014, 10:35 AM Co-Sign MD  Patient seen and discussed with NP Lawerence, I agree with her docuemnation above. 74 yo female with complex medical history as documented above admitted with COPD exacerbation and HCAP pneumonia. She was initially improving clinically however on 9/19 had acute worsening of her SOB and became unresponsive requiring intubation. Was hypotensive requiring pressors, now weaned off. Issues with aflutter with RVR, started on amio gtt. She regularly sees Dr Stanford Breed, I see mention of SVT and VT in his notes and that's why she had been on amio but no clear description of afib history. Several old EKGs reviewed, do not see prior aflutter. Echo LVEF 30-35%, down from 40-45%, no gross focal WMAs. Potentially drop due to stress induced CM and aflutter, no focal WMAs to suggest ACS. Only very mild trop 0.07 in setting of tachycardia, hypotension, and severe infection likely demand related.   K 3.2, BUN 17, Cr 0.50, WBC 5.7, Hgb 9.6, Plt 242, ABG 7.43/47/76, BNP 1064 CXR CHF pattern EKG aflutter, RBBB 10/2014 Echo LVEF 30-35%, cannot eval diastolic function due to aflutter, mild to mod MR, mild to mod RV dysfunction, PASP 45, dilated IVC  74 yo female with respiratory failure due to multiple etiologies included COPD, HCAP, and acute on chronic systolic HF.  CHF likely promoted by tachycardia and fluids in the setting of her sepsis and hypotension. From charting she is +5.6 liters since admission, weights 165 lbs to 182 lbs. Baseline weight from 08/2014 clinic visit 160 lbs. Patient has PICC, will transduce CVP today and qAM. High input with abx etc, will stop daily maintence fluids. Now that bp stabilized increase IV lasix to 20 mg bid (descent response to 10mg  bid yesterday with UOP 3 liters, but only net negative 150 ml).  She remains in aflutter but rate controlled. CHADS2Vasc score of (CHF, Htn,age x1, Stroke x 2, gender,Vasc of 7, will start heparin gtt.   Zandra Abts MD

## 2014-11-17 NOTE — Progress Notes (Signed)
Follow Up Nutrition Assessment  DOCUMENTATION CODES:  Not applicable  INTERVENTION:  Initiate Vital af 1.2 @ 20 ml/hr via NG/OGT and increase by 10 ml every 4 hours to goal rate of 45 ml/hr.   30 ml Prostat daily.    Tube feeding regimen provides 1296 kcal (104% of needs), 96 grams of protein, and 876 ml of H2O.   NUTRITION DIAGNOSIS:  Inadequate oral intake related to inability to eat as evidenced by NPO status.  ongoing  GOAL:  Patient will meet greater than or equal to 90% of their needs  MONITOR:  Vent status, Labs, Weight trends, I & O's, TF tolerance  REASON FOR ASSESSMENT:  Consult Enteral/tube feeding initiation and management  ASSESSMENT:  74 y.o. female PMHx severe COPD on home o2, CHF, CAD, HTN, HLD, prior CVA, presented to the ER with progressive SOB. She had a venous blood gas, showing pH of 7.28, and a CXR showing new right sided infiltrate, being treated for HCAP and AoC COPD. Intubated 9/19 am due to difficulty breathing and AoC Respiratory failure.   Interval Hx: Pt failed weaning 9/21, new c/s to initiate TF.   Family/friends present today. They report that pts height is likely closer to 5'3" and her normal weight is roughly 160 lbs. Will adjust est needs accordingly.   Family reports that she had no trouble eating PTA. She did not follow any type of diet and did not take any mvi/minerals   Last meal was 9/18. Nfpe: wdl  Patient is currently intubated on ventilator support MV: 6.7 L/min Temp (24hrs), Avg:97.9 F (36.6 C), Min:97.5 F (36.4 C), Max:98.5 F (36.9 C)  Propofol: None   Diet Order:  Diet NPO time specified  Skin:  Blister R leg, scattered ecchymosis. Dry skin  Last BM:  9/16  Height:  Ht Readings from Last 1 Encounters:  11/09/2014 5\' 3"  (1.6 m)    Weight:  Wt Readings from Last 1 Encounters:  11/17/14 181 lb 3.5 oz (82.2 kg)   Wt Readings from Last 10 Encounters:  11/17/14 181 lb 3.5 oz (82.2 kg)  10/18/14 162 lb (73.483 kg)   09/07/14 160 lb (72.576 kg)  04/26/14 168 lb 14 oz (76.6 kg)  04/23/14 172 lb 6.4 oz (78.2 kg)  12/29/13 179 lb (81.194 kg)  12/14/13 175 lb (79.379 kg)  12/13/13 172 lb (78.019 kg)  11/11/13 172 lb (78.019 kg)  10/21/13 176 lb 5.9 oz (80 kg)  Admit weight: 165 lbs (75 kg)  Ideal Body Weight:  56.8 kg  BMI:  Body mass index using admit weight is 29.3 kg/(m^2).  Estimated Nutritional Needs:  Kcal:  1338 kcals Protein:  78-105 g (1.5-2 g/kg IBW) Fluid:  1.5 liters  EDUCATION NEEDS:  No education needs identified at this time  Burtis Junes RD, LDN Nutrition Pager: [phone] 11/17/2014 11:41 AM

## 2014-11-17 NOTE — Progress Notes (Signed)
Obvious rhythm change on monitor noted by RN.  Verified by 2 RNs.  EKG ordered per protocol. Revealed first degree heart block with frequent PVCs. MD made aware. New orders to repeat EKG in 30 minutes.

## 2014-11-18 ENCOUNTER — Inpatient Hospital Stay (HOSPITAL_COMMUNITY): Payer: Medicare Other

## 2014-11-18 LAB — GLUCOSE, CAPILLARY
GLUCOSE-CAPILLARY: 101 mg/dL — AB (ref 65–99)
GLUCOSE-CAPILLARY: 161 mg/dL — AB (ref 65–99)
GLUCOSE-CAPILLARY: 123 mg/dL — AB (ref 65–99)
Glucose-Capillary: 105 mg/dL — ABNORMAL HIGH (ref 65–99)
Glucose-Capillary: 115 mg/dL — ABNORMAL HIGH (ref 65–99)
Glucose-Capillary: 133 mg/dL — ABNORMAL HIGH (ref 65–99)

## 2014-11-18 LAB — BLOOD GAS, ARTERIAL
ACID-BASE EXCESS: 6.2 mmol/L — AB (ref 0.0–2.0)
BICARBONATE: 29.2 meq/L — AB (ref 20.0–24.0)
Drawn by: 21310
FIO2: 40
LHR: 16 {breaths}/min
O2 SAT: 90.3 %
PCO2 ART: 58.3 mmHg — AB (ref 35.0–45.0)
PEEP: 5 cmH2O
PH ART: 7.353 (ref 7.350–7.450)
TCO2: 12.2 mmol/L (ref 0–100)
VT: 420 mL
pO2, Arterial: 70.7 mmHg — ABNORMAL LOW (ref 80.0–100.0)

## 2014-11-18 LAB — BASIC METABOLIC PANEL
ANION GAP: 7 (ref 5–15)
BUN: 27 mg/dL — ABNORMAL HIGH (ref 6–20)
CALCIUM: 7.5 mg/dL — AB (ref 8.9–10.3)
CHLORIDE: 100 mmol/L — AB (ref 101–111)
CO2: 33 mmol/L — AB (ref 22–32)
Creatinine, Ser: 0.64 mg/dL (ref 0.44–1.00)
GFR calc Af Amer: >60 mL/min (ref >60)
GFR calc non Af Amer: >60 mL/min (ref >60)
GLUCOSE: 173 mg/dL — AB (ref 65–99)
Potassium: 3.3 mmol/L — ABNORMAL LOW (ref 3.5–5.1)
Sodium: 140 mmol/L (ref 135–145)

## 2014-11-18 LAB — CBC
HCT: 32.2 % — ABNORMAL LOW (ref 36.0–46.0)
HEMOGLOBIN: 9.6 g/dL — AB (ref 12.0–15.0)
MCH: 27 pg (ref 26.0–34.0)
MCHC: 29.8 g/dL — AB (ref 30.0–36.0)
MCV: 90.4 fL (ref 78.0–100.0)
Platelets: 241 10*3/uL (ref 150–400)
RBC: 3.56 MIL/uL — ABNORMAL LOW (ref 3.87–5.11)
RDW: 16.1 % — ABNORMAL HIGH (ref 11.5–15.5)
WBC: 13.1 10*3/uL — ABNORMAL HIGH (ref 4.0–10.5)

## 2014-11-18 LAB — HEPARIN LEVEL (UNFRACTIONATED): HEPARIN UNFRACTIONATED: 0.5 [IU]/mL (ref 0.30–0.70)

## 2014-11-18 MED ORDER — FUROSEMIDE 10 MG/ML IJ SOLN
60.0000 mg | Freq: Two times a day (BID) | INTRAMUSCULAR | Status: AC
  Administered 2014-11-18: 60 mg via INTRAVENOUS
  Filled 2014-11-18: qty 6

## 2014-11-18 MED ORDER — CYANOCOBALAMIN 1000 MCG/ML IJ SOLN
1000.0000 ug | Freq: Every day | INTRAMUSCULAR | Status: AC
  Administered 2014-11-18 – 2014-11-21 (×4): 1000 ug via INTRAMUSCULAR
  Filled 2014-11-18 (×4): qty 1

## 2014-11-18 MED ORDER — FUROSEMIDE 10 MG/ML IJ SOLN
40.0000 mg | Freq: Once | INTRAMUSCULAR | Status: AC
  Administered 2014-11-18: 40 mg via INTRAVENOUS
  Filled 2014-11-18: qty 4

## 2014-11-18 MED ORDER — FENTANYL CITRATE (PF) 2500 MCG/50ML IJ SOLN
INTRAMUSCULAR | Status: AC
  Filled 2014-11-18: qty 50

## 2014-11-18 MED ORDER — MIDAZOLAM HCL 50 MG/10ML IJ SOLN
INTRAMUSCULAR | Status: AC
  Filled 2014-11-18: qty 1

## 2014-11-18 NOTE — Progress Notes (Signed)
ANTIBIOTIC CONSULT Note  Pharmacy Consult for vancomycin/zosyn Indication: pneumonia  Allergies  Allergen Reactions  . Albuterol Other (See Comments)    Jittery/shakiness  . Levaquin [Levofloxacin In D5w] Nausea Only  . Sulfa Antibiotics Nausea Only   Patient Measurements: Height: 5\' 3"  (160 cm) Weight: 182 lb 12.2 oz (82.9 kg) IBW/kg (Calculated) : 52.4  Vital Signs: Temp: 99.8 F (37.7 C) (09/23 0400) Temp Source: Axillary (09/23 0400) BP: 91/51 mmHg (09/23 0645) Pulse Rate: 81 (09/23 0645)  Labs:  Recent Labs  11/16/14 0415 11/17/14 0440 11/18/14 0415  WBC 6.0 5.7 13.1*  HGB 9.5* 9.6* 9.6*  PLT 224 242 241  CREATININE 0.54 0.50 0.64    Estimated Creatinine Clearance: 63.9 mL/min (by C-G formula based on Cr of 0.64).   Recent Labs  11/15/14 1227  [place]     Microbiology: Recent Results (from the past 720 hour(s))  MRSA PCR Screening     Status: None   Collection Time: 11/25/2014 11:59 PM  Result Value Ref Range Status   MRSA by PCR NEGATIVE NEGATIVE Final    Comment:        The GeneXpert MRSA Assay (FDA approved for NASAL specimens only), is one component of a comprehensive MRSA colonization surveillance program. It is not intended to diagnose MRSA infection nor to guide or monitor treatment for MRSA infections.     Medical History: Past Medical History  Diagnosis Date  . Hypertension   . Hyperlipidemia   . Cerebrovascular disease, unspecified   . Unspecified glaucoma   . Obesity, unspecified   . Coronary artery disease     cath 04/21/14:severe multivessel disease with a left dominant coronary system, chronic occlusion of the LAD, and heavily calcified severe stenosis of the proximal left circumflex.  She underwent rotational atherectomy and stenting of the left circumflex  . Ventricular tachycardia   . SVT (supraventricular tachycardia)   . AAA (abdominal aortic aneurysm)   . COPD (chronic obstructive pulmonary disease) 2013   [place] of breath     WITH ACTIVITY--USES OXYGEN AS NEEDED  2&1/2 L PER NASAL CANNUL  . Complication of anesthesia     PT STATES SHE CAN NOT BE PUT TO SLEEP BECAUSE OF HER LUNG PROBLEMS  . Carotid artery occlusion   . Anemia   . CHF (congestive heart failure)   . Abdominal aneurysm     "hasn't been repaired" (10/20/2013)  . Pneumonia X ~ 2  . On home oxygen therapy     "4L; 24/7" (10/20/2013)  . GERD (gastroesophageal reflux disease)   . Migraines     "before tubal ligation I had them alot; seldom have one anymore" (10/20/2013)  . Arthritis     "knees mainly" (10/20/2013)  . Bladder cancer 2009  . Atrial fibrillation   . Bell palsy 2014   Medications:  Scheduled:  . antiseptic oral rinse  7 mL Mouth Rinse BID  . antiseptic oral rinse  7 mL Mouth Rinse QID  . aspirin  300 mg Rectal Daily  . budesonide (PULMICORT) nebulizer solution  0.25 mg Nebulization BID  . chlorhexidine gluconate  15 mL Mouth Rinse BID  . feeding supplement (PRO-STAT SUGAR FREE 64)  30 mL Per Tube Daily  . furosemide  20 mg Intravenous BID  . insulin aspart  0-15 Units Subcutaneous 6 times per day  . insulin glargine  7 Units Subcutaneous BID  . latanoprost  1 drop Both Eyes QHS  . levalbuterol  0.63 mg  Nebulization Q6H  . methylPREDNISolone (SOLU-MEDROL) injection  60 mg Intravenous Q12H  . pantoprazole (PROTONIX) IV  40 mg Intravenous Daily  . piperacillin-tazobactam (ZOSYN)  IV  3.375 g Intravenous Q8H  . sodium chloride  10-40 mL Intracatheter Q12H  . vancomycin  750 mg Intravenous Q12H   Assessment: 74 yo female with hx of severe COPD admitted for HCAP and COPD exacerbation. Acute encephalopathy with worsening pulmonary status, acute on chronic respiratory failure with hypercapnia.    Vancomycin trough reported as 17 mcg/ml, therapeutic  Goal of Therapy:  Vancomycin trough level 15-20 mcg/ml  Plan: Continue Zosyn 3.375 gm IV q8 hours and vancomycin 750 mg IV q12 hours F/u renal  function, cultures and clinical course Duration of therapy per MD  Hart Robinsons, PharmD Clinical Pharmacist Pager [phone]  11/18/2014,7:53 AM

## 2014-11-18 NOTE — Progress Notes (Addendum)
Patient ID: [name] [name], female   DOB: 01-Dec-1940, 74 y.o.   MRN: [id number]        Subjective:    No events overnight  Objective:   Temp:  [98.1 F (36.7 C)-99.8 F (37.7 C)] 99.8 F (37.7 C) (09/23 0400) Pulse Rate:  [62-92] 92 (09/23 0945) Resp:  [15-18] 18 (09/23 0945) BP: (88-140)/(38-103) 114/54 mmHg (09/23 0945) SpO2:  [94 %-99 %] 97 % (09/23 0945) FiO2 (%):  [40 %] 40 % (09/23 1002) Weight:  [182 lb 12.2 oz (82.9 kg)] 182 lb 12.2 oz (82.9 kg) (09/23 0500) Last BM Date: 11/04/2014  Filed Weights   11/16/14 0500 11/17/14 0500 11/18/14 0500  Weight: 175 lb 11.3 oz (79.7 kg) 181 lb 3.5 oz (82.2 kg) 182 lb 12.2 oz (82.9 kg)    Intake/Output Summary (Last 24 hours) at 11/18/14 1019 Last data filed at 11/18/14 0924  Gross per 24 hour  Intake 2504.5 ml  Output   1650 ml  Net  854.5 ml    Telemetry: aflutter, normal rates  Exam:  General: NAD  Resp: clear anteriorally  Cardiac: irreg, 2/6 systolic murmur RUSB, GI:  MSK: no LE edema  Neuro: no focal deficits  Psych: appropriate affect  Lab Results:  Basic Metabolic Panel:  Recent Labs Lab 11/16/14 0415 11/17/14 0440 11/18/14 0415  NA 140 142 140  K 3.5 3.2* 3.3*  CL 102 103 100*  CO2 32 33* 33*  GLUCOSE 109* 115* 173*  BUN 18 17 27*  CREATININE 0.54 0.50 0.64  CALCIUM 7.8* 7.6* 7.5*    Liver Function Tests:  Recent Labs Lab 11/22/2014 1903 11/15/14 0415  AST 18 64*  ALT 11* 40  ALKPHOS 73 55  BILITOT 0.6 0.8  PROT 6.3* 5.5*  ALBUMIN 3.3* 2.8*    CBC:  Recent Labs Lab 11/16/14 0415 11/17/14 0440 11/18/14 0415  WBC 6.0 5.7 13.1*  HGB 9.5* 9.6* 9.6*  HCT 31.3* 31.9* 32.2*  MCV 90.5 90.1 90.4  PLT 224 242 241    Cardiac Enzymes:  Recent Labs Lab 11/14/14 0833 11/14/14 1425 11/14/14 2000  TROPONINI <0.03 0.05* 0.07*    BNP:  Recent Labs  12/29/13 1238 04/15/14 1417 10/18/14 1456  PROBNP 195.0* 230.0* 288.0*    Coagulation:  Recent Labs Lab 11/14/2014 1903    INR 1.03    ECG:   Medications:   Scheduled Medications: . antiseptic oral rinse  7 mL Mouth Rinse BID  . antiseptic oral rinse  7 mL Mouth Rinse QID  . aspirin  300 mg Rectal Daily  . budesonide (PULMICORT) nebulizer solution  0.25 mg Nebulization BID  . chlorhexidine gluconate  15 mL Mouth Rinse BID  . feeding supplement (PRO-STAT SUGAR FREE 64)  30 mL Per Tube Daily  . furosemide  20 mg Intravenous BID  . insulin aspart  0-15 Units Subcutaneous 6 times per day  . insulin glargine  7 Units Subcutaneous BID  . latanoprost  1 drop Both Eyes QHS  . levalbuterol  0.63 mg Nebulization Q6H  . methylPREDNISolone (SOLU-MEDROL) injection  60 mg Intravenous Q12H  . pantoprazole (PROTONIX) IV  40 mg Intravenous Daily  . piperacillin-tazobactam (ZOSYN)  IV  3.375 g Intravenous Q8H  . sodium chloride  10-40 mL Intracatheter Q12H  . vancomycin  750 mg Intravenous Q12H     Infusions: . amiodarone 30 mg/hr (11/18/14 0052)  . feeding supplement (VITAL AF 1.2 CAL) 1,000 mL (11/18/14 0800)  . fentaNYL infusion INTRAVENOUS 75 mcg/hr (11/18/14  3220)  . heparin 1,000 Units/hr (11/17/14 1700)  . midazolam (VERSED) infusion 0.5 mg/hr (11/17/14 2149)     PRN Medications:  acetaminophen, fentaNYL (SUBLIMAZE) injection, fentaNYL (SUBLIMAZE) injection, ipratropium, midazolam, midazolam, ondansetron **OR** ondansetron (ZOFRAN) IV, sodium chloride     Assessment/Plan    1. Acute on chronic systolic HF - echo 03/5425 LVFE 30-35%, cannot eval diastolic function -  CHF likely promoted by tachycardia and fluids in the setting of her sepsis and hypotension. From charting yesterday she was+5.6 liters since admission,weight up 165-182 lbs.  - CVPs are variable, will repeat today - she is positive 1 liters yesterday, not much urine output with lasix 20mg  bid. Mild uptrend in BUN, stable Cr. Will increase lasix to 60mg  IV bid for today. Will need to be reassessed regarding her UOP and renal function for  continued lasix dosing over the weekend.  - from notes has not been on beta blocker due to severe COPD. I will defer to her primary cardiologist, pending blood pressure would consider low dose ACE-I later this admission.   2. COPD - per primary team   3. Pneumonia - per primary team  4. Aflutter - continue amio and hep gtt - if extubated and taking PO would stop amio gtt and start amio 400mg  bid x 7 days, then 200mg  bid x 14 days, then amio 200mg  daily. If rate controlling agent needed I would avoid her home dilt due to low LVEF and start Toprol XL 12.5mg  daily, however amio alone thus far has been sufficient - once taking oral would convert to eliquis 5mg  bid.   - from notes has not been on beta blocker due to severe COPD.       Carlyle Dolly, M.D.

## 2014-11-18 NOTE — Progress Notes (Signed)
[facility] PROGRESS NOTE  [name] [name] EXB:[id number] DOB: 02-Jan-1941 DOA: 11/17/2014 PCP: Sherrie Mustache, MD  Assessment/Plan: Acute on chronic ventilatory dependent hypoxemic and hypercapnic respiratory failure -Remains intubated today, failed weaning trial this morning she was still sleepy. -Is currently on pressure support of 5 and doing well, will reattempt extubation later this afternoon. -Appreciate Dr. Luan Pulling input and recommendations.  Healthcare associated pneumonia -Continue vancomycin and Zosyn for now. -Consider narrowing once back on oral regimen.  Ischemic cardiomyopathy with acute combined CHF -Ejection fraction of 30-35% with diffuse hypokinesis. -Currently appears volume overloaded. -Agree with IV Lasix to try and diurese her.  Atrial fibrillation with rapid ventricular response -Continue amiodarone and heparin drips today. -Plan to transition to oral amiodarone once extubated and taking by mouth's. -A rate controlling agent is required would use Toprol-XL as per cardiology recommendations.  -Plan to transition to oral liquids for anticoagulation once extubated.  Hypotension -Resolved. -Probably due to old diltiazem and possibly sepsis. -Did require pressors, these have now been discontinued.  Acute on chronic COPD -To extubate today. -No significant bronchospasm on exam, continue to titrate steroids. -Continue as needed Xopenex.  AAA -Increased aneurysm to 5.1 cm compared to 3.5 cm in 2011. -To see vascular surgery following discharge.  Iron deficiency/ B 12 deficiency anemia -Start ferrous sulfate once taking by mouth's. -Continue daily vitamin B-12 IV supplementation for a week and then monthly thereafter.   Code Status: Full code Family Communication: Husband [name] [name] and other family members at bedside   Disposition Plan: Attempt extubation this afternoon   Consultants:  Cardiology    pulmonary   Antibiotics:  Vancomycin     Zosyn  Subjective: Intubated, awake, can follow simple commands, frustrated because she is not able to relate her concerns, has been an paper in hand however is unable to write coherent sentences  Objective: Filed Vitals:   11/18/14 1000 11/18/14 1015 11/18/14 1030 11/18/14 1045  BP: 114/65 120/66 132/86 134/76  Pulse: 101 85 88 110  Temp:      TempSrc:      Resp: 21 9 17 15   Height:      Weight:      SpO2: 88% 93% 88% 100%    Intake/Output Summary (Last 24 hours) at 11/18/14 1101 Last data filed at 11/18/14 0924  Gross per 24 hour  Intake 2354.5 ml  Output   1650 ml  Net  704.5 ml   Filed Weights   11/16/14 0500 11/17/14 0500 11/18/14 0500  Weight: 79.7 kg (175 lb 11.3 oz) 82.2 kg (181 lb 3.5 oz) 82.9 kg (182 lb 12.2 oz)    Exam:   General:  Awake  Cardiovascular: RRR  Respiratory: CTA B  Abdomen: S/NT/ND/+BS  Extremities: 2-3++ edema   Neurologic:  Moves all 4  Data Reviewed: Basic Metabolic Panel:  Recent Labs Lab 11/14/14 0834 11/15/14 0415 11/16/14 0415 11/17/14 0440 11/18/14 0415  NA 140 141 140 142 140  K 5.1 3.7 3.5 3.2* 3.3*  CL 95* 97* 102 103 100*  CO2 38* 36* 32 33* 33*  GLUCOSE 235* 112* 109* 115* 173*  BUN 14 15 18 17  27*  CREATININE 0.65 0.62 0.54 0.50 0.64  CALCIUM 8.6* 7.8* 7.8* 7.6* 7.5*   Liver Function Tests:  Recent Labs Lab 11/07/2014 1903 11/15/14 0415  AST 18 64*  ALT 11* 40  ALKPHOS 73 55  BILITOT 0.6 0.8  PROT 6.3* 5.5*  ALBUMIN 3.3* 2.8*   No results for input(s): LIPASE, AMYLASE  in the last 168 hours. No results for input(s): AMMONIA in the last 168 hours. CBC:  Recent Labs Lab 11/25/2014 1903  11/14/14 0834 11/15/14 0415 11/16/14 0415 11/17/14 0440 11/18/14 0415  WBC 14.2*  < > 22.4* 15.3* 6.0 5.7 13.1*  NEUTROABS 11.6*  --   --   --   --   --   --   HGB 10.7*  < > 11.3* 10.1* 9.5* 9.6* 9.6*  HCT 37.3  < > 40.3 34.7* 31.3* 31.9* 32.2*  MCV 94.0  < > 96.4 92.3 90.5 90.1 90.4  PLT 303  < > 454*  403* 224 242 241  < > = values in this interval not displayed. Cardiac Enzymes:  Recent Labs Lab 11/14/14 0833 11/14/14 1425 11/14/14 2000  TROPONINI <0.03 0.05* 0.07*   BNP (last 3 results)  Recent Labs  04/26/14 1358 10/27/2014 1903 11/16/14 0415  BNP 294.0* 549.0* 1064.0*    ProBNP (last 3 results)  Recent Labs  12/29/13 1238 04/15/14 1417 10/18/14 1456  PROBNP 195.0* 230.0* 288.0*    CBG:  Recent Labs Lab 11/17/14 1719 11/17/14 2010 11/17/14 2358 11/18/14 0421 11/18/14 0948  GLUCAP 113* 111* 115* 133* 105*    Recent Results (from the past 240 hour(s))  MRSA PCR Screening     Status: None   Collection Time: 11/13/2014 11:59 PM  Result Value Ref Range Status   MRSA by PCR NEGATIVE NEGATIVE Final    Comment:        The GeneXpert MRSA Assay (FDA approved for NASAL specimens only), is one component of a comprehensive MRSA colonization surveillance program. It is not intended to diagnose MRSA infection nor to guide or monitor treatment for MRSA infections.      Studies: Dg Chest Port 1 View  11/18/2014   CLINICAL DATA:  Congestive heart failure. Pt. Is on a vent. Morning portable.  EXAM: PORTABLE CHEST 1 VIEW  COMPARISON:  11/16/2014  FINDINGS: Interstitial thickening, most notable in the lung bases, moderate left and small right pleural effusions and cardiomegaly persists, consistent with congestive heart failure. No change from prior exam.  No pneumothorax.  Endotracheal tube stable with its tip 4.6 cm above the chronic.  Orogastric tube is retracted. Tip now lies 3 cm below the carina, within the mid esophagus. This will need to be further inserted, at least 20 cm, to allow the tip to enter the stomach.  IMPRESSION: 1. Orogastric tube is retracted.  Tip now lies in the mid esophagus. 2. No other change from the prior study. Persistent changes of congestive heart failure with interstitial edema and left greater than right pleural effusions. 3. Endotracheal  tube is stable and well positioned.   Electronically Signed   By: Lajean Manes M.D.   On: 11/18/2014 08:14    Scheduled Meds: . antiseptic oral rinse  7 mL Mouth Rinse BID  . antiseptic oral rinse  7 mL Mouth Rinse QID  . aspirin  300 mg Rectal Daily  . budesonide (PULMICORT) nebulizer solution  0.25 mg Nebulization BID  . chlorhexidine gluconate  15 mL Mouth Rinse BID  . feeding supplement (PRO-STAT SUGAR FREE 64)  30 mL Per Tube Daily  . furosemide  40 mg Intravenous Once  . furosemide  60 mg Intravenous BID  . insulin aspart  0-15 Units Subcutaneous 6 times per day  . insulin glargine  7 Units Subcutaneous BID  . latanoprost  1 drop Both Eyes QHS  . levalbuterol  0.63 mg Nebulization Q6H  .  methylPREDNISolone (SOLU-MEDROL) injection  60 mg Intravenous Q12H  . pantoprazole (PROTONIX) IV  40 mg Intravenous Daily  . piperacillin-tazobactam (ZOSYN)  IV  3.375 g Intravenous Q8H  . sodium chloride  10-40 mL Intracatheter Q12H  . vancomycin  750 mg Intravenous Q12H   Continuous Infusions: . amiodarone 30 mg/hr (11/18/14 0052)  . feeding supplement (VITAL AF 1.2 CAL) 1,000 mL (11/18/14 0800)  . fentaNYL infusion INTRAVENOUS 75 mcg/hr (11/18/14 0924)  . heparin 1,000 Units/hr (11/17/14 1700)  . midazolam (VERSED) infusion 0.5 mg/hr (11/17/14 2149)    Principal Problem:   HCAP (healthcare-associated pneumonia) Active Problems:   Essential hypertension   Acute exacerbation of chronic obstructive pulmonary disease (COPD)   Acute on chronic respiratory failure with hypercapnia   Chronic combined systolic and diastolic congestive heart failure   Ischemic cardiomyopathy   Hyperglycemia, drug-induced   Atrial flutter   Obesity   Hypotension   Anemia, iron deficiency   Vitamin B12 deficiency   AAA (abdominal aortic aneurysm) without rupture   Acute on chronic systolic heart failure    Critical Care time: 45 minutes. Greater than 50% of this time was spent in direct contact with  the patient coordinating care.    Lelon Frohlich  [facility] Pager [phone]  If 7PM-7AM, please contact night-coverage at [email or web address], password [facility] 11/18/2014, 11:01 AM  LOS: 7 days

## 2014-11-18 NOTE — Progress Notes (Signed)
Subjective: She remains intubated and on the ventilator. She failed weaning this morning but she's not very awake yet. Her husband at bedside says she does have living will and didn't particularly want to be intubated and placed on a ventilator but when we called him he did want to give her a chance. I told him that she does seem to be improving so I'm hopeful that we can get her off but if it becomes clear that we cannot then we need to discuss what to do from there and he understands  Objective: Vital signs in last 24 hours: Temp:  [98.1 F (36.7 C)-99.8 F (37.7 C)] 99.8 F (37.7 C) (09/23 0400) Pulse Rate:  [62-90] 81 (09/23 0645) Resp:  [15-18] 16 (09/23 0645) BP: (88-140)/(38-103) 91/51 mmHg (09/23 0645) SpO2:  [94 %-99 %] 96 % (09/23 0717) FiO2 (%):  [40 %] 40 % (09/23 0717) Weight:  [82.9 kg (182 lb 12.2 oz)] 82.9 kg (182 lb 12.2 oz) (09/23 0500) Weight change: 0.7 kg (1 lb 8.7 oz) Last BM Date: 11/08/2014  Intake/Output from previous day: 09/22 0701 - 09/23 0700 In: 2648.3 [I.V.:1270.8; NG/GT:527.4; IV Piggyback:850] Out: 1650 [Urine:1650]  PHYSICAL EXAM General appearance: She is intubated still a little sleepy although sedation has been turned off Resp: rhonchi bilaterally Cardio: Her heart is regular and I don't hear a gallop GI: soft, non-tender; bowel sounds normal; no masses,  no organomegaly Extremities: Her edema is about the same  Lab Results:  Results for orders placed or performed during the hospital encounter of 11/04/2014 (from the past 48 hour(s))  Glucose, capillary     Status: Abnormal   Collection Time: 11/16/14  7:54 AM  Result Value Ref Range   Glucose-Capillary 104 (H) 65 - 99 mg/dL   Comment 1 Notify RN    Comment 2 Document in Chart   Blood gas, arterial     Status: Abnormal   Collection Time: 11/16/14  8:51 AM  Result Value Ref Range   FIO2 40.00    Delivery systems VENTILATOR    Mode PRESSURE REGULATED VOLUME CONTROL    Peep/cpap 5.0 cm H20   Pressure support 5 cm H20   pH, Arterial 7.248 (L) 7.350 - 7.450   pCO2 arterial 72.4 (HH) 35.0 - 45.0 mmHg    Comment: CRITICAL RESULT CALLED TO, READ BACK BY AND VERIFIED WITH: LEIGHANN SCHONEWITZ RN BY ROBIN POWELL RRT AT 0855 ON 11/16/14    pO2, Arterial 69.2 (L) 80.0 - 100.0 mmHg   Bicarbonate 26.2 (H) 20.0 - 24.0 mEq/L   Acid-Base Excess 3.8 (H) 0.0 - 2.0 mmol/L   O2 Saturation 86.0 %   Collection site LEFT RADIAL    Drawn by [id number]    Sample type ARTHROGRAPHIS SPECIES    Allens test (pass/fail) PASS PASS  Glucose, capillary     Status: Abnormal   Collection Time: 11/16/14 11:41 AM  Result Value Ref Range   Glucose-Capillary 108 (H) 65 - 99 mg/dL   Comment 1 Notify RN    Comment 2 Document in Chart   Glucose, capillary     Status: Abnormal   Collection Time: 11/16/14  4:33 PM  Result Value Ref Range   Glucose-Capillary 125 (H) 65 - 99 mg/dL   Comment 1 Notify RN    Comment 2 Document in Chart   Glucose, capillary     Status: Abnormal   Collection Time: 11/16/14  7:41 PM  Result Value Ref Range   Glucose-Capillary 106 (H) 65 -  99 mg/dL   Comment 1 Notify RN   Glucose, capillary     Status: Abnormal   Collection Time: 11/16/14 11:23 PM  Result Value Ref Range   Glucose-Capillary 116 (H) 65 - 99 mg/dL   Comment 1 Notify RN   Glucose, capillary     Status: Abnormal   Collection Time: 11/17/14  4:12 AM  Result Value Ref Range   Glucose-Capillary 116 (H) 65 - 99 mg/dL   Comment 1 Notify RN   Blood gas, arterial     Status: Abnormal   Collection Time: 11/17/14  4:30 AM  Result Value Ref Range   FIO2 40.00    Delivery systems VENTILATOR    Mode PRESSURE REGULATED VOLUME CONTROL    VT 420 mL   LHR 16.0 resp/min   Peep/cpap 5.0 cm H20   pH, Arterial 7.438 7.350 - 7.450   pCO2 arterial 47.0 (H) 35.0 - 45.0 mmHg   pO2, Arterial 75.9 (L) 80.0 - 100.0 mmHg   Bicarbonate 30.4 (H) 20.0 - 24.0 mEq/L   TCO2 12.1 0 - 100 mmol/L   Acid-Base Excess 6.9 (H) 0.0 - 2.0 mmol/L   O2  Saturation 93.3 %   Collection site LEFT BRACHIAL    Drawn by [id number]    Sample type ARTERIAL    Allens test (pass/fail) NOT INDICATED (A) PASS  Basic metabolic panel     Status: Abnormal   Collection Time: 11/17/14  4:40 AM  Result Value Ref Range   Sodium 142 135 - 145 mmol/L   Potassium 3.2 (L) 3.5 - 5.1 mmol/L   Chloride 103 101 - 111 mmol/L   CO2 33 (H) 22 - 32 mmol/L   Glucose, Bld 115 (H) 65 - 99 mg/dL   BUN 17 6 - 20 mg/dL   Creatinine, Ser 2.11 0.44 - 1.00 mg/dL   Calcium 7.6 (L) 8.9 - 10.3 mg/dL   GFR calc non Af Amer >60 >60 mL/min   GFR calc Af Amer >60 >60 mL/min    Comment: (NOTE) The eGFR has been calculated using the CKD EPI equation. This calculation has not been validated in all clinical situations. eGFR's persistently <60 mL/min signify possible Chronic Kidney Disease.    Anion gap 6 5 - 15  CBC     Status: Abnormal   Collection Time: 11/17/14  4:40 AM  Result Value Ref Range   WBC 5.7 4.0 - 10.5 K/uL   RBC 3.54 (L) 3.87 - 5.11 MIL/uL   Hemoglobin 9.6 (L) 12.0 - 15.0 g/dL   HCT 26.9 (L) 98.8 - 37.1 %   MCV 90.1 78.0 - 100.0 fL   MCH 27.1 26.0 - 34.0 pg   MCHC 30.1 30.0 - 36.0 g/dL   RDW 10.2 (H) 17.5 - 81.7 %   Platelets 242 150 - 400 K/uL  Glucose, capillary     Status: Abnormal   Collection Time: 11/17/14  7:29 AM  Result Value Ref Range   Glucose-Capillary 122 (H) 65 - 99 mg/dL   Comment 1 Notify RN    Comment 2 Document in Chart   Glucose, capillary     Status: Abnormal   Collection Time: 11/17/14 11:35 AM  Result Value Ref Range   Glucose-Capillary 107 (H) 65 - 99 mg/dL   Comment 1 Notify RN   Glucose, capillary     Status: Abnormal   Collection Time: 11/17/14  5:19 PM  Result Value Ref Range   Glucose-Capillary 113 (H) 65 - 99 mg/dL  Comment 1 Notify RN    Comment 2 Document in Chart   Glucose, capillary     Status: Abnormal   Collection Time: 11/17/14  8:10 PM  Result Value Ref Range   Glucose-Capillary 111 (H) 65 - 99 mg/dL   Comment  1 Notify RN   Heparin level (unfractionated)     Status: None   Collection Time: 11/17/14  9:00 PM  Result Value Ref Range   Heparin Unfractionated 0.49 0.30 - 0.70 IU/mL    Comment:        IF HEPARIN RESULTS ARE BELOW EXPECTED VALUES, AND PATIENT DOSAGE HAS BEEN CONFIRMED, SUGGEST FOLLOW UP TESTING OF ANTITHROMBIN III LEVELS.   Glucose, capillary     Status: Abnormal   Collection Time: 11/17/14 11:58 PM  Result Value Ref Range   Glucose-Capillary 115 (H) 65 - 99 mg/dL   Comment 1 Notify RN   Basic metabolic panel     Status: Abnormal   Collection Time: 11/18/14  4:15 AM  Result Value Ref Range   Sodium 140 135 - 145 mmol/L   Potassium 3.3 (L) 3.5 - 5.1 mmol/L   Chloride 100 (L) 101 - 111 mmol/L   CO2 33 (H) 22 - 32 mmol/L   Glucose, Bld 173 (H) 65 - 99 mg/dL   BUN 27 (H) 6 - 20 mg/dL   Creatinine, Ser 0.64 0.44 - 1.00 mg/dL   Calcium 7.5 (L) 8.9 - 10.3 mg/dL   GFR calc non Af Amer >60 >60 mL/min   GFR calc Af Amer >60 >60 mL/min    Comment: (NOTE) The eGFR has been calculated using the CKD EPI equation. This calculation has not been validated in all clinical situations. eGFR's persistently <60 mL/min signify possible Chronic Kidney Disease.    Anion gap 7 5 - 15  CBC     Status: Abnormal   Collection Time: 11/18/14  4:15 AM  Result Value Ref Range   WBC 13.1 (H) 4.0 - 10.5 K/uL   RBC 3.56 (L) 3.87 - 5.11 MIL/uL   Hemoglobin 9.6 (L) 12.0 - 15.0 g/dL   HCT 32.2 (L) 36.0 - 46.0 %   MCV 90.4 78.0 - 100.0 fL   MCH 27.0 26.0 - 34.0 pg   MCHC 29.8 (L) 30.0 - 36.0 g/dL   RDW 16.1 (H) 11.5 - 15.5 %   Platelets 241 150 - 400 K/uL  Heparin level (unfractionated)     Status: None   Collection Time: 11/18/14  4:15 AM  Result Value Ref Range   Heparin Unfractionated 0.50 0.30 - 0.70 IU/mL    Comment:        IF HEPARIN RESULTS ARE BELOW EXPECTED VALUES, AND PATIENT DOSAGE HAS BEEN CONFIRMED, SUGGEST FOLLOW UP TESTING OF ANTITHROMBIN III LEVELS.   Glucose, capillary      Status: Abnormal   Collection Time: 11/18/14  4:21 AM  Result Value Ref Range   Glucose-Capillary 133 (H) 65 - 99 mg/dL   Comment 1 Notify RN   Blood gas, arterial     Status: Abnormal   Collection Time: 11/18/14  6:00 AM  Result Value Ref Range   FIO2 40.00    Delivery systems VENTILATOR    Mode PRESSURE REGULATED VOLUME CONTROL    VT 420 mL   LHR 16.0 resp/min   Peep/cpap 5.0 cm H20   pH, Arterial 7.353 7.350 - 7.450   pCO2 arterial 58.3 (HH) 35.0 - 45.0 mmHg    Comment: CRITICAL RESULT CALLED TO, READ BACK  BY AND VERIFIED WITH: DANIELS,J.RN AT 0615 11/18/14 ANDERSON,S.RRT    pO2, Arterial 70.7 (L) 80.0 - 100.0 mmHg   Bicarbonate 29.2 (H) 20.0 - 24.0 mEq/L   TCO2 12.2 0 - 100 mmol/L   Acid-Base Excess 6.2 (H) 0.0 - 2.0 mmol/L   O2 Saturation 90.3 %   Collection site LEFT RADIAL    Drawn by [id number]    Sample type ARTERIAL    Allens test (pass/fail) NOT INDICATED (A) PASS    ABGS  Recent Labs  11/18/14 0600  PHART 7.353  PO2ART 70.7*  TCO2 12.2  HCO3 29.2*   CULTURES Recent Results (from the past 240 hour(s))  MRSA PCR Screening     Status: None   Collection Time: 11/04/2014 11:59 PM  Result Value Ref Range Status   MRSA by PCR NEGATIVE NEGATIVE Final    Comment:        The GeneXpert MRSA Assay (FDA approved for NASAL specimens only), is one component of a comprehensive MRSA colonization surveillance program. It is not intended to diagnose MRSA infection nor to guide or monitor treatment for MRSA infections.    Studies/Results: Dg Chest Port 1 View  11/16/2014   CLINICAL DATA:  Respiratory failure  EXAM: PORTABLE CHEST - 1 VIEW  COMPARISON:  11/15/2014  FINDINGS: The endotracheal tube tip is above the carina. There is a right arm PICC line with tip in the cavoatrial junction. Moderate cardiac enlargement noted. Bilateral pleural effusions are identified left greater than right. There is mild interstitial edema.  IMPRESSION: 1. CHF pattern appears similar to the  previous exam.   Electronically Signed   By: Kerby Moors M.D.   On: 11/16/2014 09:32    Medications:  Prior to Admission:  Prescriptions prior to admission  Medication Sig Dispense Refill Last Dose  . acetaminophen-codeine (TYLENOL #3) 300-30 MG per tablet Take 1 tablet by mouth every 4 (four) hours as needed for moderate pain.   unknown  . amiodarone (PACERONE) 200 MG tablet Take 200 mg by mouth 2 (two) times daily.    11/01/2014 at Unknown time  . aspirin EC 81 MG tablet Take 81 mg by mouth at bedtime.   11/10/2014 at Unknown time  . budesonide-formoterol (SYMBICORT) 160-4.5 MCG/ACT inhaler Inhale 2 puffs into the lungs 2 (two) times daily. 1 Inhaler 6 11/25/2014 at Unknown time  . clonazePAM (KLONOPIN) 0.5 MG tablet Take 0.25-0.5 mg by mouth every 6 (six) hours as needed for anxiety.    11/07/2014 at Unknown time  . clopidogrel (PLAVIX) 75 MG tablet Take 1 tablet (75 mg total) by mouth at bedtime.   11/10/2014 at Unknown time  . diltiazem (CARDIZEM CD) 300 MG 24 hr capsule TAKE 1 CAPSULE IN THE EVENING. 30 capsule 1 11/10/2014 at Unknown time  . furosemide (LASIX) 40 MG tablet Take 1 tablet (40 mg total) by mouth daily. (Patient taking differently: Take 40 mg by mouth every morning. ) 30 tablet 3 11/25/2014 at Unknown time  . guaiFENesin (MUCINEX) 600 MG 12 hr tablet Take 1 tablet by mouth every 12 hours as needed for cough and congestion   11/09/2014 at Unknown time  . ipratropium (ATROVENT) 0.02 % nebulizer solution USE 1 VIAL IN NEBULIZER EVERY 6 HOURS. (Patient taking differently: USE 1 VIAL IN NEBULIZER EVERY 4 HOURS FOR SHORTNESS OF BREATH/WHEEZING) 300 mL 1 UNKNOWN  . isosorbide mononitrate (IMDUR) 30 MG 24 hr tablet TAKE ONE TABLET BY MOUTH AT BEDTIME. (Patient taking differently: TAKE ONE TABLET BY MOUTH EVERY  MORNING) 30 tablet 5 11/17/2014 at Unknown time  . latanoprost (XALATAN) 0.005 % ophthalmic solution Place 1 drop into both eyes at bedtime.   11/06/2014  . levalbuterol (XOPENEX HFA)  45 MCG/ACT inhaler Inhale 2 puffs into the lungs every 4 (four) hours as needed for wheezing. (Patient taking differently: Inhale 2 puffs into the lungs every 4 (four) hours as needed for wheezing or shortness of breath. ) 15 g 6 Past Month at Unknown time  . mirtazapine (REMERON) 15 MG tablet Take 7.5 mg by mouth at bedtime. Take 1/2 tablet by mouth at bedtime   11/10/2014 at Unknown time  . nitroGLYCERIN (NITROSTAT) 0.4 MG SL tablet Place 0.4 mg under the tongue every 5 (five) minutes as needed for chest pain (may repeat x3). Chest pain   11/10/2014 at Unknown time  . nystatin (MYCOSTATIN) 100000 UNIT/ML suspension Take 5 mLs by mouth 4 (four) times daily.    11/19/2014 at Unknown time  . omeprazole (PRILOSEC) 20 MG capsule Take 20 mg by mouth daily as needed (for acid reflux).    unknown  . ondansetron (ZOFRAN) 8 MG tablet TAKE 1/2 TABLET ONCE DAILY AS NEEDED FOR NAUSEA   11/10/2014 at Unknown time  . OXYGEN Inhale 4.5 L into the lungs continuous.    11/07/2014 at Unknown time  . potassium chloride SA (K-DUR,KLOR-CON) 20 MEQ tablet Take 2 tablets by mouth every morning   11/14/2014 at Unknown time  . pravastatin (PRAVACHOL) 80 MG tablet Take 80 mg by mouth at bedtime.   11/10/2014 at Unknown time  . ranitidine (ZANTAC) 150 MG capsule Take 150 mg by mouth daily as needed for heartburn.    unknown at Unknown time  . tamsulosin (FLOMAX) 0.4 MG CAPS capsule TAKE 1 CAPSULE AT BEDTIME FOR INCONTINENCE.   11/10/2014 at Unknown time   Scheduled: . antiseptic oral rinse  7 mL Mouth Rinse BID  . antiseptic oral rinse  7 mL Mouth Rinse QID  . aspirin  300 mg Rectal Daily  . budesonide (PULMICORT) nebulizer solution  0.25 mg Nebulization BID  . chlorhexidine gluconate  15 mL Mouth Rinse BID  . feeding supplement (PRO-STAT SUGAR FREE 64)  30 mL Per Tube Daily  . furosemide  20 mg Intravenous BID  . insulin aspart  0-15 Units Subcutaneous 6 times per day  . insulin glargine  7 Units Subcutaneous BID  .  latanoprost  1 drop Both Eyes QHS  . levalbuterol  0.63 mg Nebulization Q6H  . methylPREDNISolone (SOLU-MEDROL) injection  60 mg Intravenous Q12H  . pantoprazole (PROTONIX) IV  40 mg Intravenous Daily  . piperacillin-tazobactam (ZOSYN)  IV  3.375 g Intravenous Q8H  . sodium chloride  10-40 mL Intracatheter Q12H  . vancomycin  750 mg Intravenous Q12H   Continuous: . amiodarone 30 mg/hr (11/18/14 0052)  . feeding supplement (VITAL AF 1.2 CAL) 1,000 mL (11/18/14 0645)  . fentaNYL infusion INTRAVENOUS 150 mcg/hr (11/17/14 2151)  . heparin 1,000 Units/hr (11/17/14 1700)  . midazolam (VERSED) infusion 0.5 mg/hr (11/17/14 2149)   YPP:[id number], fentaNYL (SUBLIMAZE) injection, fentaNYL (SUBLIMAZE) injection, ipratropium, midazolam, midazolam, ondansetron **OR** ondansetron (ZOFRAN) IV, sodium chloride  Assesment: She was admitted with healthcare associated pneumonia and exacerbation of heart failure. She was being treated and then was found to be very poorly responsive and was intubated after discussion with family at that time. She has remained on the ventilator now for about 3 days. This morning she still sleepy so I think we will try again to  see if she is able to come off and wean later today when she is more awake.  At baseline she has COPD and chronic respiratory failure.  She has acute on chronic heart failure that seems to be improving. Her ejection fraction was about 30-35%. Principal Problem:   HCAP (healthcare-associated pneumonia) Active Problems:   Essential hypertension   Acute exacerbation of chronic obstructive pulmonary disease (COPD)   Acute on chronic respiratory failure with hypercapnia   Chronic combined systolic and diastolic congestive heart failure   Ischemic cardiomyopathy   Hyperglycemia, drug-induced   Atrial flutter   Obesity   Hypotension   Anemia, iron deficiency   Vitamin B12 deficiency   AAA (abdominal aortic aneurysm) without rupture   Acute on  chronic systolic heart failure    Plan: Continue current treatments. Try weaning later today.    LOS: 7 days   HAWKINS,EDWARD L 11/18/2014, 7:54 AM

## 2014-11-18 NOTE — Care Management Important Message (Signed)
Important Message  Patient Details  Name: [name] [name] MRN: [id number] Date of Birth: 05-11-1940   Medicare Important Message Given:  Yes-second notification given    [name], RN 11/18/2014, 11:03 AM

## 2014-11-18 NOTE — Progress Notes (Signed)
[place] for Heparin Indication: atrial fibrillation  Allergies  Allergen Reactions  . Albuterol Other (See Comments)    Jittery/shakiness  . Levaquin [Levofloxacin In D5w] Nausea Only  . Sulfa Antibiotics Nausea Only   Patient Measurements: Height: 5\' 3"  (160 cm) Weight: 182 lb 12.2 oz (82.9 kg) IBW/kg (Calculated) : 52.4 HEPARIN DW (KG): 68.3  Vital Signs: Temp: 99.8 F (37.7 C) (09/23 0400) Temp Source: Axillary (09/23 0400) BP: 91/51 mmHg (09/23 0645) Pulse Rate: 81 (09/23 0645)  Labs:  Recent Labs  11/16/14 0415 11/17/14 0440 11/17/14 2100 11/18/14 0415  HGB 9.5* 9.6*  --  9.6*  HCT 31.3* 31.9*  --  32.2*  PLT 224 242  --  241  HEPARINUNFRC  --   --  0.49 0.50  CREATININE 0.54 0.50  --  0.64    Estimated Creatinine Clearance: 63.9 mL/min (by C-G formula based on Cr of 0.64).  Medications:  Scheduled:  . antiseptic oral rinse  7 mL Mouth Rinse BID  . antiseptic oral rinse  7 mL Mouth Rinse QID  . aspirin  300 mg Rectal Daily  . budesonide (PULMICORT) nebulizer solution  0.25 mg Nebulization BID  . chlorhexidine gluconate  15 mL Mouth Rinse BID  . feeding supplement (PRO-STAT SUGAR FREE 64)  30 mL Per Tube Daily  . furosemide  20 mg Intravenous BID  . insulin aspart  0-15 Units Subcutaneous 6 times per day  . insulin glargine  7 Units Subcutaneous BID  . latanoprost  1 drop Both Eyes QHS  . levalbuterol  0.63 mg Nebulization Q6H  . methylPREDNISolone (SOLU-MEDROL) injection  60 mg Intravenous Q12H  . pantoprazole (PROTONIX) IV  40 mg Intravenous Daily  . piperacillin-tazobactam (ZOSYN)  IV  3.375 g Intravenous Q8H  . sodium chloride  10-40 mL Intracatheter Q12H  . vancomycin  750 mg Intravenous Q12H   Assessment: Okay for Protocol, PT/INR WNL on 9/16.  H/H stable.  HL is therapeutic.  Goal of Therapy:  Heparin level 0.3-0.7 units/ml Monitor platelets by anticoagulation protocol: Yes   Plan:  Continue Heparin  drip @ 1000 units/hr Daily Hep Level and CBC while on Heparin  Hall, Scott A 11/18/2014,7:48 AM

## 2014-11-19 LAB — CBC
HCT: 30.9 % — ABNORMAL LOW (ref 36.0–46.0)
Hemoglobin: 9.2 g/dL — ABNORMAL LOW (ref 12.0–15.0)
MCH: 27.1 pg (ref 26.0–34.0)
MCHC: 29.8 g/dL — ABNORMAL LOW (ref 30.0–36.0)
MCV: 90.9 fL (ref 78.0–100.0)
Platelets: 216 10*3/uL (ref 150–400)
RBC: 3.4 MIL/uL — ABNORMAL LOW (ref 3.87–5.11)
RDW: 16.1 % — ABNORMAL HIGH (ref 11.5–15.5)
WBC: 13.4 10*3/uL — ABNORMAL HIGH (ref 4.0–10.5)

## 2014-11-19 LAB — BASIC METABOLIC PANEL
Anion gap: 11 (ref 5–15)
BUN: 25 mg/dL — ABNORMAL HIGH (ref 6–20)
CO2: 37 mmol/L — ABNORMAL HIGH (ref 22–32)
Calcium: 7 mg/dL — ABNORMAL LOW (ref 8.9–10.3)
Chloride: 95 mmol/L — ABNORMAL LOW (ref 101–111)
Creatinine, Ser: 0.47 mg/dL (ref 0.44–1.00)
GFR calc Af Amer: >60 mL/min (ref >60)
GFR calc non Af Amer: >60 mL/min (ref >60)
Glucose, Bld: 161 mg/dL — ABNORMAL HIGH (ref 65–99)
Potassium: 2.2 mmol/L — CL (ref 3.5–5.1)
Sodium: 143 mmol/L (ref 135–145)

## 2014-11-19 LAB — GLUCOSE, CAPILLARY
GLUCOSE-CAPILLARY: 101 mg/dL — AB (ref 65–99)
GLUCOSE-CAPILLARY: 121 mg/dL — AB (ref 65–99)
GLUCOSE-CAPILLARY: 124 mg/dL — AB (ref 65–99)
GLUCOSE-CAPILLARY: 144 mg/dL — AB (ref 65–99)
GLUCOSE-CAPILLARY: 90 mg/dL (ref 65–99)
GLUCOSE-CAPILLARY: 94 mg/dL (ref 65–99)
Glucose-Capillary: 101 mg/dL — ABNORMAL HIGH (ref 65–99)

## 2014-11-19 LAB — HEPARIN LEVEL (UNFRACTIONATED)
HEPARIN UNFRACTIONATED: 0.67 [IU]/mL (ref 0.30–0.70)
Heparin Unfractionated: 1.19 IU/mL — ABNORMAL HIGH (ref 0.30–0.70)

## 2014-11-19 LAB — C DIFFICILE QUICK SCREEN W PCR REFLEX
C DIFFICILE (CDIFF) INTERP: NEGATIVE
C DIFFICLE (CDIFF) ANTIGEN: NEGATIVE
C Diff toxin: NEGATIVE

## 2014-11-19 MED ORDER — METHYLPREDNISOLONE SODIUM SUCC 125 MG IJ SOLR
60.0000 mg | INTRAMUSCULAR | Status: DC
  Administered 2014-11-19: 60 mg via INTRAVENOUS
  Filled 2014-11-19: qty 2

## 2014-11-19 MED ORDER — POTASSIUM CHLORIDE 10 MEQ/100ML IV SOLN
10.0000 meq | INTRAVENOUS | Status: AC
  Administered 2014-11-19 (×4): 10 meq via INTRAVENOUS
  Filled 2014-11-19 (×2): qty 100

## 2014-11-19 NOTE — Progress Notes (Signed)
Lab called about heparin level >1.10 then registering 2.00.  They are going to do a dilution or redraw for accurate verification.

## 2014-11-19 NOTE — Progress Notes (Signed)
[place] for Heparin Indication: atrial fibrillation  Allergies  Allergen Reactions  . Albuterol Other (See Comments)    Jittery/shakiness  . Levaquin [Levofloxacin In D5w] Nausea Only  . Sulfa Antibiotics Nausea Only   Patient Measurements: Height: 5\' 3"  (160 cm) Weight: 180 lb 12.4 oz (82 kg) IBW/kg (Calculated) : 52.4 HEPARIN DW (KG): 68.3  Vital Signs: Temp: 96.7 F (35.9 C) (09/24 0739) Temp Source: Axillary (09/24 0739) BP: 129/63 mmHg (09/24 0700) Pulse Rate: 91 (09/24 0800)  Labs:  Recent Labs  11/17/14 0440  11/18/14 0415 11/19/14 0451 11/19/14 0722  HGB 9.6*  --  9.6* 9.2*  --   HCT 31.9*  --  32.2* 30.9*  --   PLT 242  --  241 216  --   HEPARINUNFRC  --   < > 0.50 1.19* 0.67  CREATININE 0.50  --  0.64 0.47  --   < > = values in this interval not displayed.  Estimated Creatinine Clearance: 63.5 mL/min (by C-G formula based on Cr of 0.47).  Medications:  Scheduled:  . antiseptic oral rinse  7 mL Mouth Rinse BID  . antiseptic oral rinse  7 mL Mouth Rinse QID  . aspirin  300 mg Rectal Daily  . budesonide (PULMICORT) nebulizer solution  0.25 mg Nebulization BID  . chlorhexidine gluconate  15 mL Mouth Rinse BID  . cyanocobalamin  1,000 mcg Intramuscular Daily  . feeding supplement (PRO-STAT SUGAR FREE 64)  30 mL Per Tube Daily  . insulin aspart  0-15 Units Subcutaneous 6 times per day  . insulin glargine  7 Units Subcutaneous BID  . latanoprost  1 drop Both Eyes QHS  . levalbuterol  0.63 mg Nebulization Q6H  . methylPREDNISolone (SOLU-MEDROL) injection  60 mg Intravenous Q24H  . pantoprazole (PROTONIX) IV  40 mg Intravenous Daily  . piperacillin-tazobactam (ZOSYN)  IV  3.375 g Intravenous Q8H  . potassium chloride  10 mEq Intravenous Q1 Hr x 4  . sodium chloride  10-40 mL Intracatheter Q12H  . vancomycin  750 mg Intravenous Q12H   Assessment: Okay for Protocol, PT/INR WNL on 9/16.  H/H stable.  HL is  therapeutic.  Goal of Therapy:  Heparin level 0.3-0.7 units/ml Monitor platelets by anticoagulation protocol: Yes   Plan:  Continue Heparin drip @ 1000 units/hr Daily Hep Level and CBC while on Heparin  Hall, Scott A 11/19/2014,10:06 AM

## 2014-11-19 NOTE — Progress Notes (Signed)
CRITICAL VALUE ALERT  Critical value received:  K+ 2.2  Date of notification:  11/19/14  Time of notification:  0643  Critical value read back: yes  Nurse who received alert:  T.Neilson RN  MD notified (1st page):  Dr. Georgiann Mohs   Time of first page:  [phone]  MD notified (2nd page):  Time of second page:  Responding MD:  Dr. Georgiann Mohs  Time MD responded:  [phone]

## 2014-11-19 NOTE — Progress Notes (Addendum)
Did fair with breathing trial numbers good , anxiety most likely caused end of trial . X-ray about same , potassium low. Fluid about 4868 ahead.  Patient doing well .

## 2014-11-19 NOTE — Progress Notes (Signed)
[facility] PROGRESS NOTE  QUIANA COBAUGH TOI:[id number] DOB: Jan 23, 1941 DOA: 11/20/2014 PCP: Sherrie Mustache, MD  Assessment/Plan: Acute on chronic ventilatory dependent hypoxemic and hypercapnic respiratory failure -MF: COPD exacerbation, HCAP, acute CHF. See below for details. -Remains intubated today, will re-attempt weaning today as she is much more alert and responsive. -Appreciate Dr. Luan Pulling input and recommendations.  Healthcare associated pneumonia -Continue vancomycin and Zosyn for now. -Consider narrowing once back on oral regimen.  Ischemic cardiomyopathy with acute combined CHF -Ejection fraction of 30-35% with diffuse hypokinesis. -Currently appears volume overloaded. -Agree with IV Lasix to try and diurese her. -UOP 4125 cc overnight, although overall Is and Os remain positive.  Atrial fibrillation with rapid ventricular response -Continue amiodarone and heparin drips today. -Plan to transition to oral amiodarone once extubated and taking by mouth's. -If rate controlling agent is required would use Toprol-XL as per cardiology recommendations.  -Plan to transition to Eliquis for anticoagulation once extubated.  Hypotension -Resolved. -Probably due to diltiazem and possibly sepsis. -Did require pressors, these have now been discontinued.  Acute on chronic COPD -No significant bronchospasm on exam, continue to titrate steroids. -Continue as needed Xopenex.  AAA -Increased aneurysm to 5.1 cm compared to 3.5 cm in 2011. -To see vascular surgery following discharge.  Iron deficiency/ B 12 deficiency anemia -Start ferrous sulfate once taking by mouth's. -Continue daily vitamin B-12 IV supplementation for a week and then monthly thereafter.   Code Status: Full code Family Communication: Husband Roger at bedside updated on plan of care and all questions answered. Disposition Plan: Attempt extubation this afternoon   Consultants:  Cardiology    Pulmonary   Antibiotics:  Vancomycin    Zosyn  Subjective: Intubated, awake, more alert than yesterday, is now able to ask questions by writing them in a notebook.  Objective: Filed Vitals:   11/19/14 0700 11/19/14 0739 11/19/14 0800 11/19/14 0817  BP: 129/63     Pulse: 63  91   Temp:  96.7 F (35.9 C)    TempSrc:  Axillary    Resp: 16  16   Height:      Weight:      SpO2: 95%  93% 95%    Intake/Output Summary (Last 24 hours) at 11/19/14 0852 Last data filed at 11/19/14 0800  Gross per 24 hour  Intake 2712.12 ml  Output   4125 ml  Net -1412.88 ml   Filed Weights   11/17/14 0500 11/18/14 0500 11/19/14 0500  Weight: 82.2 kg (181 lb 3.5 oz) 82.9 kg (182 lb 12.2 oz) 82 kg (180 lb 12.4 oz)    Exam:   General:  Awake  Cardiovascular: irregular  Respiratory: CTA B  Abdomen: S/NT/ND/+BS  Extremities: 2-3++ edema   Neurologic:  Moves all 4  Data Reviewed: Basic Metabolic Panel:  Recent Labs Lab 11/15/14 0415 11/16/14 0415 11/17/14 0440 11/18/14 0415 11/19/14 0451  NA 141 140 142 140 143  K 3.7 3.5 3.2* 3.3* 2.2*  CL 97* 102 103 100* 95*  CO2 36* 32 33* 33* 37*  GLUCOSE 112* 109* 115* 173* 161*  BUN 15 18 17  27* 25*  CREATININE 0.62 0.54 0.50 0.64 0.47  CALCIUM 7.8* 7.8* 7.6* 7.5* 7.0*   Liver Function Tests:  Recent Labs Lab 11/15/14 0415  AST 64*  ALT 40  ALKPHOS 55  BILITOT 0.8  PROT 5.5*  ALBUMIN 2.8*   No results for input(s): LIPASE, AMYLASE in the last 168 hours. No results for input(s): AMMONIA in the  last 168 hours. CBC:  Recent Labs Lab 11/15/14 0415 11/16/14 0415 11/17/14 0440 11/18/14 0415 11/19/14 0451  WBC 15.3* 6.0 5.7 13.1* 13.4*  HGB 10.1* 9.5* 9.6* 9.6* 9.2*  HCT 34.7* 31.3* 31.9* 32.2* 30.9*  MCV 92.3 90.5 90.1 90.4 90.9  PLT 403* 224 242 241 216   Cardiac Enzymes:  Recent Labs Lab 11/14/14 0833 11/14/14 1425 11/14/14 2000  TROPONINI <0.03 0.05* 0.07*   BNP (last 3 results)  Recent Labs   04/26/14 1358 11/19/2014 1903 11/16/14 0415  BNP 294.0* 549.0* 1064.0*    ProBNP (last 3 results)  Recent Labs  12/29/13 1238 04/15/14 1417 10/18/14 1456  PROBNP 195.0* 230.0* 288.0*    CBG:  Recent Labs Lab 11/18/14 1612 11/18/14 2011 11/19/14 0009 11/19/14 0454 11/19/14 0725  GLUCAP 161* 123* 144* 124* 121*    Recent Results (from the past 240 hour(s))  MRSA PCR Screening     Status: None   Collection Time: 11/17/2014 11:59 PM  Result Value Ref Range Status   MRSA by PCR NEGATIVE NEGATIVE Final    Comment:        The GeneXpert MRSA Assay (FDA approved for NASAL specimens only), is one component of a comprehensive MRSA colonization surveillance program. It is not intended to diagnose MRSA infection nor to guide or monitor treatment for MRSA infections.   C difficile quick scan w PCR reflex     Status: None   Collection Time: 11/19/14 12:02 AM  Result Value Ref Range Status   C Diff antigen NEGATIVE NEGATIVE Final   C Diff toxin NEGATIVE NEGATIVE Final   C Diff interpretation Negative for toxigenic C. difficile  Final     Studies: Dg Chest Port 1 View  11/18/2014   CLINICAL DATA:  Congestive heart failure. Pt. Is on a vent. Morning portable.  EXAM: PORTABLE CHEST 1 VIEW  COMPARISON:  11/16/2014  FINDINGS: Interstitial thickening, most notable in the lung bases, moderate left and small right pleural effusions and cardiomegaly persists, consistent with congestive heart failure. No change from prior exam.  No pneumothorax.  Endotracheal tube stable with its tip 4.6 cm above the chronic.  Orogastric tube is retracted. Tip now lies 3 cm below the carina, within the mid esophagus. This will need to be further inserted, at least 20 cm, to allow the tip to enter the stomach.  IMPRESSION: 1. Orogastric tube is retracted.  Tip now lies in the mid esophagus. 2. No other change from the prior study. Persistent changes of congestive heart failure with interstitial edema and left  greater than right pleural effusions. 3. Endotracheal tube is stable and well positioned.   Electronically Signed   By: Lajean Manes M.D.   On: 11/18/2014 08:14    Scheduled Meds: . antiseptic oral rinse  7 mL Mouth Rinse BID  . antiseptic oral rinse  7 mL Mouth Rinse QID  . aspirin  300 mg Rectal Daily  . budesonide (PULMICORT) nebulizer solution  0.25 mg Nebulization BID  . chlorhexidine gluconate  15 mL Mouth Rinse BID  . cyanocobalamin  1,000 mcg Intramuscular Daily  . feeding supplement (PRO-STAT SUGAR FREE 64)  30 mL Per Tube Daily  . insulin aspart  0-15 Units Subcutaneous 6 times per day  . insulin glargine  7 Units Subcutaneous BID  . latanoprost  1 drop Both Eyes QHS  . levalbuterol  0.63 mg Nebulization Q6H  . methylPREDNISolone (SOLU-MEDROL) injection  60 mg Intravenous Q12H  . pantoprazole (PROTONIX) IV  40  mg Intravenous Daily  . piperacillin-tazobactam (ZOSYN)  IV  3.375 g Intravenous Q8H  . potassium chloride  10 mEq Intravenous Q1 Hr x 4  . sodium chloride  10-40 mL Intracatheter Q12H  . vancomycin  750 mg Intravenous Q12H   Continuous Infusions: . amiodarone 30 mg/hr (11/19/14 0017)  . feeding supplement (VITAL AF 1.2 CAL) 1,000 mL (11/18/14 1957)  . fentaNYL infusion INTRAVENOUS 50 mcg/hr (11/19/14 0817)  . heparin 1,000 Units/hr (11/18/14 1544)  . midazolam (VERSED) infusion 1 mg/hr (11/18/14 2000)    Principal Problem:   HCAP (healthcare-associated pneumonia) Active Problems:   Essential hypertension   Acute exacerbation of chronic obstructive pulmonary disease (COPD)   Acute on chronic respiratory failure with hypercapnia   Chronic combined systolic and diastolic congestive heart failure   Ischemic cardiomyopathy   Hyperglycemia, drug-induced   Atrial flutter   Obesity   Hypotension   Anemia, iron deficiency   Vitamin B12 deficiency   AAA (abdominal aortic aneurysm) without rupture   Acute on chronic systolic heart failure    Critical Care time:  35 minutes. Greater than 50% of this time was spent in direct contact with the patient coordinating care.    Lelon Frohlich  [facility] Pager [phone]  If 7PM-7AM, please contact night-coverage at [email or web address], password [facility] 11/19/2014, 8:52 AM  LOS: 8 days

## 2014-11-19 NOTE — Progress Notes (Signed)
Subjective: She is awake alert and writing notes. No complaints.  Objective: Vital signs in last 24 hours: Temp:  [96.7 F (35.9 C)-98.4 F (36.9 C)] 96.7 F (35.9 C) (09/24 0739) Pulse Rate:  [26-110] 91 (09/24 0800) Resp:  [9-22] 16 (09/24 0800) BP: (73-161)/(46-115) 129/63 mmHg (09/24 0700) SpO2:  [73 %-100 %] 95 % (09/24 0817) FiO2 (%):  [40 %] 40 % (09/24 0901) Weight:  [82 kg (180 lb 12.4 oz)] 82 kg (180 lb 12.4 oz) (09/24 0500) Weight change: -0.9 kg (-1 lb 15.7 oz) Last BM Date:  (flex seal tube)  Intake/Output from previous day: 09/23 0701 - 09/24 0700 In: 2618.4 [I.V.:942.1; NG/GT:1106.3; IV Piggyback:450] Out: 4125 [Urine:3575; Stool:550]  PHYSICAL EXAM General appearance: alert and Intubated and sedated Resp: rhonchi bilaterally Cardio: irregularly irregular rhythm GI: soft, non-tender; bowel sounds normal; no masses,  no organomegaly Extremities: extremities normal, atraumatic, no cyanosis or edema  Lab Results:  Results for orders placed or performed during the hospital encounter of 11/02/2014 (from the past 48 hour(s))  Glucose, capillary     Status: Abnormal   Collection Time: 11/17/14 11:35 AM  Result Value Ref Range   Glucose-Capillary 107 (H) 65 - 99 mg/dL   Comment 1 Notify RN   Glucose, capillary     Status: Abnormal   Collection Time: 11/17/14  5:19 PM  Result Value Ref Range   Glucose-Capillary 113 (H) 65 - 99 mg/dL   Comment 1 Notify RN    Comment 2 Document in Chart   Glucose, capillary     Status: Abnormal   Collection Time: 11/17/14  8:10 PM  Result Value Ref Range   Glucose-Capillary 111 (H) 65 - 99 mg/dL   Comment 1 Notify RN   Heparin level (unfractionated)     Status: None   Collection Time: 11/17/14  9:00 PM  Result Value Ref Range   Heparin Unfractionated 0.49 0.30 - 0.70 IU/mL    Comment:        IF HEPARIN RESULTS ARE BELOW EXPECTED VALUES, AND PATIENT DOSAGE HAS BEEN CONFIRMED, SUGGEST FOLLOW UP TESTING OF ANTITHROMBIN III  LEVELS.   Glucose, capillary     Status: Abnormal   Collection Time: 11/17/14 11:58 PM  Result Value Ref Range   Glucose-Capillary 115 (H) 65 - 99 mg/dL   Comment 1 Notify RN   Basic metabolic panel     Status: Abnormal   Collection Time: 11/18/14  4:15 AM  Result Value Ref Range   Sodium 140 135 - 145 mmol/L   Potassium 3.3 (L) 3.5 - 5.1 mmol/L   Chloride 100 (L) 101 - 111 mmol/L   CO2 33 (H) 22 - 32 mmol/L   Glucose, Bld 173 (H) 65 - 99 mg/dL   BUN 27 (H) 6 - 20 mg/dL   Creatinine, Ser 8.34 0.44 - 1.00 mg/dL   Calcium 7.5 (L) 8.9 - 10.3 mg/dL   GFR calc non Af Amer >60 >60 mL/min   GFR calc Af Amer >60 >60 mL/min    Comment: (NOTE) The eGFR has been calculated using the CKD EPI equation. This calculation has not been validated in all clinical situations. eGFR's persistently <60 mL/min signify possible Chronic Kidney Disease.    Anion gap 7 5 - 15  CBC     Status: Abnormal   Collection Time: 11/18/14  4:15 AM  Result Value Ref Range   WBC 13.1 (H) 4.0 - 10.5 K/uL   RBC 3.56 (L) 3.87 - 5.11 MIL/uL  Hemoglobin 9.6 (L) 12.0 - 15.0 g/dL   HCT 32.2 (L) 36.0 - 46.0 %   MCV 90.4 78.0 - 100.0 fL   MCH 27.0 26.0 - 34.0 pg   MCHC 29.8 (L) 30.0 - 36.0 g/dL   RDW 16.1 (H) 11.5 - 15.5 %   Platelets 241 150 - 400 K/uL  Heparin level (unfractionated)     Status: None   Collection Time: 11/18/14  4:15 AM  Result Value Ref Range   Heparin Unfractionated 0.50 0.30 - 0.70 IU/mL    Comment:        IF HEPARIN RESULTS ARE BELOW EXPECTED VALUES, AND PATIENT DOSAGE HAS BEEN CONFIRMED, SUGGEST FOLLOW UP TESTING OF ANTITHROMBIN III LEVELS.   Glucose, capillary     Status: Abnormal   Collection Time: 11/18/14  4:21 AM  Result Value Ref Range   Glucose-Capillary 133 (H) 65 - 99 mg/dL   Comment 1 Notify RN   Blood gas, arterial     Status: Abnormal   Collection Time: 11/18/14  6:00 AM  Result Value Ref Range   FIO2 40.00    Delivery systems VENTILATOR    Mode PRESSURE REGULATED  VOLUME CONTROL    VT 420 mL   LHR 16.0 resp/min   Peep/cpap 5.0 cm H20   pH, Arterial 7.353 7.350 - 7.450   pCO2 arterial 58.3 (HH) 35.0 - 45.0 mmHg    Comment: CRITICAL RESULT CALLED TO, READ BACK BY AND VERIFIED WITH: DANIELS,J.RN AT 0615 11/18/14 ANDERSON,S.RRT    pO2, Arterial 70.7 (L) 80.0 - 100.0 mmHg   Bicarbonate 29.2 (H) 20.0 - 24.0 mEq/L   TCO2 12.2 0 - 100 mmol/L   Acid-Base Excess 6.2 (H) 0.0 - 2.0 mmol/L   O2 Saturation 90.3 %   Collection site LEFT RADIAL    Drawn by [id number]    Sample type ARTERIAL    Allens test (pass/fail) NOT INDICATED (A) PASS  Glucose, capillary     Status: Abnormal   Collection Time: 11/18/14  9:48 AM  Result Value Ref Range   Glucose-Capillary 105 (H) 65 - 99 mg/dL   Comment 1 Document in Chart   Glucose, capillary     Status: Abnormal   Collection Time: 11/18/14 11:20 AM  Result Value Ref Range   Glucose-Capillary 101 (H) 65 - 99 mg/dL   Comment 1 Document in Chart   Glucose, capillary     Status: Abnormal   Collection Time: 11/18/14  4:12 PM  Result Value Ref Range   Glucose-Capillary 161 (H) 65 - 99 mg/dL  Glucose, capillary     Status: Abnormal   Collection Time: 11/18/14  8:11 PM  Result Value Ref Range   Glucose-Capillary 123 (H) 65 - 99 mg/dL  C difficile quick scan w PCR reflex     Status: None   Collection Time: 11/19/14 12:02 AM  Result Value Ref Range   C Diff antigen NEGATIVE NEGATIVE   C Diff toxin NEGATIVE NEGATIVE   C Diff interpretation Negative for toxigenic C. difficile   Glucose, capillary     Status: Abnormal   Collection Time: 11/19/14 12:09 AM  Result Value Ref Range   Glucose-Capillary 144 (H) 65 - 99 mg/dL  Heparin level (unfractionated)     Status: Abnormal   Collection Time: 11/19/14  4:51 AM  Result Value Ref Range   Heparin Unfractionated 1.19 (H) 0.30 - 0.70 IU/mL    Comment:        IF HEPARIN RESULTS ARE BELOW EXPECTED VALUES,  AND PATIENT DOSAGE HAS BEEN CONFIRMED, SUGGEST FOLLOW UP TESTING OF  ANTITHROMBIN III LEVELS. RESULTS CONFIRMED BY MANUAL DILUTION Results Called to: PHILLIPS, C AT 0736 ON 09 RESULTS CONFIRMED BY MANUAL DILUTION Results Called to: [place] ON 11/19/2014 BY WOODS, M CORRECTED ON 09/24 AT 0736: PREVIOUSLY REPORTED AS >1.10        IF HEPARIN RESULTS ARE BELOW EXPECTED VALUES, AND PATIENT DOSAGE HAS BEEN CONFIRMED, SUGGEST FOLLOW UP TESTING OF ANTITHROMBIN III LEVELS.   CBC     Status: Abnormal   Collection Time: 11/19/14  4:51 AM  Result Value Ref Range   WBC 13.4 (H) 4.0 - 10.5 K/uL   RBC 3.40 (L) 3.87 - 5.11 MIL/uL   Hemoglobin 9.2 (L) 12.0 - 15.0 g/dL   HCT 30.9 (L) 36.0 - 46.0 %   MCV 90.9 78.0 - 100.0 fL   MCH 27.1 26.0 - 34.0 pg   MCHC 29.8 (L) 30.0 - 36.0 g/dL   RDW 16.1 (H) 11.5 - 15.5 %   Platelets 216 150 - 400 K/uL  Basic metabolic panel     Status: Abnormal   Collection Time: 11/19/14  4:51 AM  Result Value Ref Range   Sodium 143 135 - 145 mmol/L   Potassium 2.2 (LL) 3.5 - 5.1 mmol/L    Comment: CRITICAL RESULT CALLED TO, READ BACK BY AND VERIFIED WITH: NIELSON T. AT 0644A ON [id number] BY THOMPSON S.    Chloride 95 (L) 101 - 111 mmol/L   CO2 37 (H) 22 - 32 mmol/L   Glucose, Bld 161 (H) 65 - 99 mg/dL   BUN 25 (H) 6 - 20 mg/dL   Creatinine, Ser 0.47 0.44 - 1.00 mg/dL   Calcium 7.0 (L) 8.9 - 10.3 mg/dL   GFR calc non Af Amer >60 >60 mL/min   GFR calc Af Amer >60 >60 mL/min    Comment: (NOTE) The eGFR has been calculated using the CKD EPI equation. This calculation has not been validated in all clinical situations. eGFR's persistently <60 mL/min signify possible Chronic Kidney Disease.    Anion gap 11 5 - 15  Glucose, capillary     Status: Abnormal   Collection Time: 11/19/14  4:54 AM  Result Value Ref Range   Glucose-Capillary 124 (H) 65 - 99 mg/dL  Heparin level (unfractionated)     Status: None   Collection Time: 11/19/14  7:22 AM  Result Value Ref Range   Heparin Unfractionated 0.67 0.30 - 0.70 IU/mL    Comment:         IF HEPARIN RESULTS ARE BELOW EXPECTED VALUES, AND PATIENT DOSAGE HAS BEEN CONFIRMED, SUGGEST FOLLOW UP TESTING OF ANTITHROMBIN III LEVELS.   Glucose, capillary     Status: Abnormal   Collection Time: 11/19/14  7:25 AM  Result Value Ref Range   Glucose-Capillary 121 (H) 65 - 99 mg/dL   Comment 1 Notify RN    Comment 2 Document in Chart     ABGS  Recent Labs  11/18/14 0600  PHART 7.353  PO2ART 70.7*  TCO2 12.2  HCO3 29.2*   CULTURES Recent Results (from the past 240 hour(s))  MRSA PCR Screening     Status: None   Collection Time: 11/22/2014 11:59 PM  Result Value Ref Range Status   MRSA by PCR NEGATIVE NEGATIVE Final    Comment:        The GeneXpert MRSA Assay (FDA approved for NASAL specimens only), is one component of a comprehensive MRSA colonization surveillance  program. It is not intended to diagnose MRSA infection nor to guide or monitor treatment for MRSA infections.   C difficile quick scan w PCR reflex     Status: None   Collection Time: 11/19/14 12:02 AM  Result Value Ref Range Status   C Diff antigen NEGATIVE NEGATIVE Final   C Diff toxin NEGATIVE NEGATIVE Final   C Diff interpretation Negative for toxigenic C. difficile  Final   Studies/Results: Dg Chest Port 1 View  11/18/2014   CLINICAL DATA:  Congestive heart failure. Pt. Is on a vent. Morning portable.  EXAM: PORTABLE CHEST 1 VIEW  COMPARISON:  11/16/2014  FINDINGS: Interstitial thickening, most notable in the lung bases, moderate left and small right pleural effusions and cardiomegaly persists, consistent with congestive heart failure. No change from prior exam.  No pneumothorax.  Endotracheal tube stable with its tip 4.6 cm above the chronic.  Orogastric tube is retracted. Tip now lies 3 cm below the carina, within the mid esophagus. This will need to be further inserted, at least 20 cm, to allow the tip to enter the stomach.  IMPRESSION: 1. Orogastric tube is retracted.  Tip now lies in the mid  esophagus. 2. No other change from the prior study. Persistent changes of congestive heart failure with interstitial edema and left greater than right pleural effusions. 3. Endotracheal tube is stable and well positioned.   Electronically Signed   By: Lajean Manes M.D.   On: 11/18/2014 08:14    Medications:  Prior to Admission:  Prescriptions prior to admission  Medication Sig Dispense Refill Last Dose  . acetaminophen-codeine (TYLENOL #3) 300-30 MG per tablet Take 1 tablet by mouth every 4 (four) hours as needed for moderate pain.   unknown  . amiodarone (PACERONE) 200 MG tablet Take 200 mg by mouth 2 (two) times daily.    11/15/2014 at Unknown time  . aspirin EC 81 MG tablet Take 81 mg by mouth at bedtime.   11/10/2014 at Unknown time  . budesonide-formoterol (SYMBICORT) 160-4.5 MCG/ACT inhaler Inhale 2 puffs into the lungs 2 (two) times daily. 1 Inhaler 6 11/01/2014 at Unknown time  . clonazePAM (KLONOPIN) 0.5 MG tablet Take 0.25-0.5 mg by mouth every 6 (six) hours as needed for anxiety.    11/17/2014 at Unknown time  . clopidogrel (PLAVIX) 75 MG tablet Take 1 tablet (75 mg total) by mouth at bedtime.   11/10/2014 at Unknown time  . diltiazem (CARDIZEM CD) 300 MG 24 hr capsule TAKE 1 CAPSULE IN THE EVENING. 30 capsule 1 11/10/2014 at Unknown time  . furosemide (LASIX) 40 MG tablet Take 1 tablet (40 mg total) by mouth daily. (Patient taking differently: Take 40 mg by mouth every morning. ) 30 tablet 3 11/09/2014 at Unknown time  . guaiFENesin (MUCINEX) 600 MG 12 hr tablet Take 1 tablet by mouth every 12 hours as needed for cough and congestion   11/08/2014 at Unknown time  . ipratropium (ATROVENT) 0.02 % nebulizer solution USE 1 VIAL IN NEBULIZER EVERY 6 HOURS. (Patient taking differently: USE 1 VIAL IN NEBULIZER EVERY 4 HOURS FOR SHORTNESS OF BREATH/WHEEZING) 300 mL 1 UNKNOWN  . isosorbide mononitrate (IMDUR) 30 MG 24 hr tablet TAKE ONE TABLET BY MOUTH AT BEDTIME. (Patient taking differently: TAKE ONE  TABLET BY MOUTH EVERY MORNING) 30 tablet 5 11/05/2014 at Unknown time  . latanoprost (XALATAN) 0.005 % ophthalmic solution Place 1 drop into both eyes at bedtime.   11/21/2014  . levalbuterol (XOPENEX HFA) 45 MCG/ACT inhaler  Inhale 2 puffs into the lungs every 4 (four) hours as needed for wheezing. (Patient taking differently: Inhale 2 puffs into the lungs every 4 (four) hours as needed for wheezing or shortness of breath. ) 15 g 6 Past Month at Unknown time  . mirtazapine (REMERON) 15 MG tablet Take 7.5 mg by mouth at bedtime. Take 1/2 tablet by mouth at bedtime   11/10/2014 at Unknown time  . nitroGLYCERIN (NITROSTAT) 0.4 MG SL tablet Place 0.4 mg under the tongue every 5 (five) minutes as needed for chest pain (may repeat x3). Chest pain   11/10/2014 at Unknown time  . nystatin (MYCOSTATIN) 100000 UNIT/ML suspension Take 5 mLs by mouth 4 (four) times daily.    10/31/2014 at Unknown time  . omeprazole (PRILOSEC) 20 MG capsule Take 20 mg by mouth daily as needed (for acid reflux).    unknown  . ondansetron (ZOFRAN) 8 MG tablet TAKE 1/2 TABLET ONCE DAILY AS NEEDED FOR NAUSEA   11/02/2014 at Unknown time  . OXYGEN Inhale 4.5 L into the lungs continuous.    11/04/2014 at Unknown time  . potassium chloride SA (K-DUR,KLOR-CON) 20 MEQ tablet Take 2 tablets by mouth every morning   10/31/2014 at Unknown time  . pravastatin (PRAVACHOL) 80 MG tablet Take 80 mg by mouth at bedtime.   11/10/2014 at Unknown time  . ranitidine (ZANTAC) 150 MG capsule Take 150 mg by mouth daily as needed for heartburn.    unknown at Unknown time  . tamsulosin (FLOMAX) 0.4 MG CAPS capsule TAKE 1 CAPSULE AT BEDTIME FOR INCONTINENCE.   11/10/2014 at Unknown time   Scheduled: . antiseptic oral rinse  7 mL Mouth Rinse BID  . antiseptic oral rinse  7 mL Mouth Rinse QID  . aspirin  300 mg Rectal Daily  . budesonide (PULMICORT) nebulizer solution  0.25 mg Nebulization BID  . chlorhexidine gluconate  15 mL Mouth Rinse BID  . cyanocobalamin   1,000 mcg Intramuscular Daily  . feeding supplement (PRO-STAT SUGAR FREE 64)  30 mL Per Tube Daily  . insulin aspart  0-15 Units Subcutaneous 6 times per day  . insulin glargine  7 Units Subcutaneous BID  . latanoprost  1 drop Both Eyes QHS  . levalbuterol  0.63 mg Nebulization Q6H  . methylPREDNISolone (SOLU-MEDROL) injection  60 mg Intravenous Q24H  . pantoprazole (PROTONIX) IV  40 mg Intravenous Daily  . piperacillin-tazobactam (ZOSYN)  IV  3.375 g Intravenous Q8H  . potassium chloride  10 mEq Intravenous Q1 Hr x 4  . sodium chloride  10-40 mL Intracatheter Q12H  . vancomycin  750 mg Intravenous Q12H   Continuous: . amiodarone 30 mg/hr (11/19/14 0017)  . feeding supplement (VITAL AF 1.2 CAL) 1,000 mL (11/18/14 1957)  . fentaNYL infusion INTRAVENOUS 50 mcg/hr (11/19/14 0817)  . heparin 1,000 Units/hr (11/18/14 1544)  . midazolam (VERSED) infusion 1 mg/hr (11/18/14 2000)   LNZ:[id number], fentaNYL (SUBLIMAZE) injection, fentaNYL (SUBLIMAZE) injection, ipratropium, midazolam, midazolam, ondansetron **OR** ondansetron (ZOFRAN) IV, sodium chloride  Assesment: She was admitted with healthcare associated pneumonia and had acute on chronic respiratory failure with hypercapnia. She was also hypotensive and tachycardic during all that. All of this has improved. She has remained on the ventilator and yesterday she was not able to be extubated because she was not as alert. This morning she is very alert writing notes and looks good on weaning protocol Principal Problem:   HCAP (healthcare-associated pneumonia) Active Problems:   Essential hypertension   Acute exacerbation of  chronic obstructive pulmonary disease (COPD)   Acute on chronic respiratory failure with hypercapnia   Chronic combined systolic and diastolic congestive heart failure   Ischemic cardiomyopathy   Hyperglycemia, drug-induced   Atrial flutter   Obesity   Hypotension   Anemia, iron deficiency   Vitamin B12 deficiency    AAA (abdominal aortic aneurysm) without rupture   Acute on chronic systolic heart failure    Plan: She may be able to be extubated later today.    LOS: 8 days   HAWKINS,EDWARD L 11/19/2014, 9:17 AM

## 2014-11-20 ENCOUNTER — Inpatient Hospital Stay (HOSPITAL_COMMUNITY): Payer: Medicare Other

## 2014-11-20 LAB — GLUCOSE, CAPILLARY
GLUCOSE-CAPILLARY: 153 mg/dL — AB (ref 65–99)
GLUCOSE-CAPILLARY: 160 mg/dL — AB (ref 65–99)
GLUCOSE-CAPILLARY: 122 mg/dL — AB (ref 65–99)
Glucose-Capillary: 137 mg/dL — ABNORMAL HIGH (ref 65–99)
Glucose-Capillary: 115 mg/dL — ABNORMAL HIGH (ref 65–99)

## 2014-11-20 LAB — BLOOD GAS, ARTERIAL
ACID-BASE EXCESS: 13 mmol/L — AB (ref 0.0–2.0)
BICARBONATE: 34.6 meq/L — AB (ref 20.0–24.0)
Drawn by: 221791
FIO2: 40
Mode: POSITIVE
O2 Saturation: 81.3 %
PATIENT TEMPERATURE: 37
PCO2 ART: 82.8 mmHg — AB (ref 35.0–45.0)
PH ART: 7.302 — AB (ref 7.350–7.450)
PRESSURE SUPPORT: 5 cmH2O
TCO2: 11.5 mmol/L (ref 0–100)
pO2, Arterial: 56.3 mmHg — ABNORMAL LOW (ref 80.0–100.0)

## 2014-11-20 LAB — BASIC METABOLIC PANEL
ANION GAP: 7 (ref 5–15)
BUN: 22 mg/dL — ABNORMAL HIGH (ref 6–20)
CALCIUM: 7.2 mg/dL — AB (ref 8.9–10.3)
CO2: 38 mmol/L — AB (ref 22–32)
CREATININE: 0.47 mg/dL (ref 0.44–1.00)
Chloride: 98 mmol/L — ABNORMAL LOW (ref 101–111)
GFR calc Af Amer: >60 mL/min (ref >60)
GFR calc non Af Amer: >60 mL/min (ref >60)
GLUCOSE: 195 mg/dL — AB (ref 65–99)
Potassium: 2.8 mmol/L — ABNORMAL LOW (ref 3.5–5.1)
Sodium: 143 mmol/L (ref 135–145)

## 2014-11-20 LAB — CBC
HEMATOCRIT: 30.3 % — AB (ref 36.0–46.0)
Hemoglobin: 9 g/dL — ABNORMAL LOW (ref 12.0–15.0)
MCH: 27.2 pg (ref 26.0–34.0)
MCHC: 29.7 g/dL — AB (ref 30.0–36.0)
MCV: 91.5 fL (ref 78.0–100.0)
PLATELETS: 207 10*3/uL (ref 150–400)
RBC: 3.31 MIL/uL — ABNORMAL LOW (ref 3.87–5.11)
RDW: 16.3 % — AB (ref 11.5–15.5)
WBC: 14.1 10*3/uL — ABNORMAL HIGH (ref 4.0–10.5)

## 2014-11-20 LAB — TROPONIN I: Troponin I: <0.03 ng/mL (ref <0.031)

## 2014-11-20 LAB — HEPARIN LEVEL (UNFRACTIONATED): Heparin Unfractionated: 0.41 IU/mL (ref 0.30–0.70)

## 2014-11-20 LAB — MAGNESIUM: Magnesium: 1.8 mg/dL (ref 1.7–2.4)

## 2014-11-20 LAB — POTASSIUM: POTASSIUM: 3.3 mmol/L — AB (ref 3.5–5.1)

## 2014-11-20 MED ORDER — LORAZEPAM 2 MG/ML IJ SOLN
0.5000 mg | INTRAMUSCULAR | Status: DC | PRN
  Administered 2014-11-20 – 2014-11-22 (×8): 0.5 mg via INTRAVENOUS
  Filled 2014-11-20 (×8): qty 1

## 2014-11-20 MED ORDER — FENTANYL CITRATE (PF) 2500 MCG/50ML IJ SOLN
INTRAMUSCULAR | Status: AC
  Filled 2014-11-20: qty 50

## 2014-11-20 MED ORDER — POTASSIUM CHLORIDE IN NACL 20-0.9 MEQ/L-% IV SOLN
INTRAVENOUS | Status: DC
Start: 2014-11-20 — End: 2014-11-20
  Administered 2014-11-20: 1000 mL via INTRAVENOUS

## 2014-11-20 MED ORDER — LORAZEPAM 2 MG/ML IJ SOLN: 0.5000 mg | Freq: Once | INTRAMUSCULAR | Status: DC

## 2014-11-20 MED ORDER — METHYLPREDNISOLONE SODIUM SUCC 40 MG IJ SOLR
30.0000 mg | INTRAMUSCULAR | Status: DC
  Administered 2014-11-20 – 2014-11-21 (×2): 30 mg via INTRAVENOUS
  Filled 2014-11-20 (×2): qty 1

## 2014-11-20 MED ORDER — POTASSIUM CHLORIDE 10 MEQ/100ML IV SOLN
10.0000 meq | INTRAVENOUS | Status: DC
  Administered 2014-11-20: 10 meq via INTRAVENOUS
  Filled 2014-11-20 (×4): qty 100

## 2014-11-20 MED ORDER — FUROSEMIDE 10 MG/ML IJ SOLN
40.0000 mg | Freq: Two times a day (BID) | INTRAMUSCULAR | Status: DC
  Administered 2014-11-20 – 2014-11-21 (×3): 40 mg via INTRAVENOUS
  Filled 2014-11-20 (×3): qty 4

## 2014-11-20 MED ORDER — POTASSIUM CHLORIDE 10 MEQ/100ML IV SOLN
10.0000 meq | INTRAVENOUS | Status: AC
  Administered 2014-11-20 (×6): 10 meq via INTRAVENOUS
  Filled 2014-11-20 (×2): qty 100

## 2014-11-20 NOTE — Progress Notes (Addendum)
Patient very concerned about her vital signs this shift.  Patient had mentioned in writing "We are all going to die."  Reassured her that her vital sign are stable at this time.  Patient wanted me to stay in room while she is sleeping to reassure her at this time.  The patient WBC's are trending upwards in elevation even though she is on current antibiotics of vancomycin and Zosyn a possible another antibiotic to be added to be added?  The patient has had no temperature throughout the shift even though she has complained of feeling hotter than normal and low blood pressures.

## 2014-11-20 NOTE — Progress Notes (Signed)
Patient had NIF -21 and Vital Capacity of .40L . She also had  increased PCO2, decreased PO2 resulted on ABG so extubation deferred and she was returned to rest mode settings on the ventilator.

## 2014-11-20 NOTE — Progress Notes (Signed)
ANTIBIOTIC CONSULT NOTE; follow up  Pharmacy Consult for vancomycin/zosyn Indication: pneumonia  Allergies  Allergen Reactions  . Albuterol Other (See Comments)    Jittery/shakiness  . Levaquin [Levofloxacin In D5w] Nausea Only  . Sulfa Antibiotics Nausea Only   Patient Measurements: Height: 5\' 3"  (160 cm) Weight: 187 lb 6.3 oz (85 kg) IBW/kg (Calculated) : 52.4  Vital Signs: Temp: 96.4 F (35.8 C) (09/25 0746) Temp Source: Axillary (09/25 0746) BP: 101/54 mmHg (09/25 0700) Pulse Rate: 68 (09/25 0700)  Labs:  Recent Labs  11/18/14 0415 11/19/14 0451 11/20/14 0523  WBC 13.1* 13.4* 14.1*  HGB 9.6* 9.2* 9.0*  PLT 241 216 207  CREATININE 0.64 0.47 0.47   Estimated Creatinine Clearance: 64.7 mL/min (by C-G formula based on Cr of 0.47).  No results for input(s): VANCOTROUGH, VANCOPEAK, VANCORANDOM, GENTTROUGH, GENTPEAK, GENTRANDOM, TOBRATROUGH, TOBRAPEAK, TOBRARND, AMIKACINPEAK, AMIKACINTROU, AMIKACIN in the last 72 hours.   Microbiology: Recent Results (from the past 720 hour(s))  MRSA PCR Screening     Status: None   Collection Time: 11/18/2014 11:59 PM  Result Value Ref Range Status   MRSA by PCR NEGATIVE NEGATIVE Final    Comment:        The GeneXpert MRSA Assay (FDA approved for NASAL specimens only), is one component of a comprehensive MRSA colonization surveillance program. It is not intended to diagnose MRSA infection nor to guide or monitor treatment for MRSA infections.   C difficile quick scan w PCR reflex     Status: None   Collection Time: 11/19/14 12:02 AM  Result Value Ref Range Status   C Diff antigen NEGATIVE NEGATIVE Final   C Diff toxin NEGATIVE NEGATIVE Final   C Diff interpretation Negative for toxigenic C. difficile  Final   Medical History: Past Medical History  Diagnosis Date  . Hypertension   . Hyperlipidemia   . Cerebrovascular disease, unspecified   . Unspecified glaucoma   . Obesity, unspecified   . Coronary artery disease      cath 04/21/14:severe multivessel disease with a left dominant coronary system, chronic occlusion of the LAD, and heavily calcified severe stenosis of the proximal left circumflex.  She underwent rotational atherectomy and stenting of the left circumflex  . Ventricular tachycardia   . SVT (supraventricular tachycardia)   . AAA (abdominal aortic aneurysm)   . COPD (chronic obstructive pulmonary disease) 2013    [place] of breath     WITH ACTIVITY--USES OXYGEN AS NEEDED  2&1/2 L PER NASAL CANNUL  . Complication of anesthesia     PT STATES SHE CAN NOT BE PUT TO SLEEP BECAUSE OF HER LUNG PROBLEMS  . Carotid artery occlusion   . Anemia   . CHF (congestive heart failure)   . Abdominal aneurysm     "hasn't been repaired" (10/20/2013)  . Pneumonia X ~ 2  . On home oxygen therapy     "4L; 24/7" (10/20/2013)  . GERD (gastroesophageal reflux disease)   . Migraines     "before tubal ligation I had them alot; seldom have one anymore" (10/20/2013)  . Arthritis     "knees mainly" (10/20/2013)  . Bladder cancer 2009  . Atrial fibrillation   . Bell palsy 2014   Medications:  Scheduled:  . antiseptic oral rinse  7 mL Mouth Rinse BID  . antiseptic oral rinse  7 mL Mouth Rinse QID  . aspirin  300 mg Rectal Daily  . budesonide (PULMICORT) nebulizer solution  0.25 mg Nebulization BID  .  chlorhexidine gluconate  15 mL Mouth Rinse BID  . cyanocobalamin  1,000 mcg Intramuscular Daily  . feeding supplement (PRO-STAT SUGAR FREE 64)  30 mL Per Tube Daily  . insulin aspart  0-15 Units Subcutaneous 6 times per day  . insulin glargine  7 Units Subcutaneous BID  . latanoprost  1 drop Both Eyes QHS  . levalbuterol  0.63 mg Nebulization Q6H  . methylPREDNISolone (SOLU-MEDROL) injection  60 mg Intravenous Q24H  . pantoprazole (PROTONIX) IV  40 mg Intravenous Daily  . piperacillin-tazobactam (ZOSYN)  IV  3.375 g Intravenous Q8H  . potassium chloride  10 mEq Intravenous Q1 Hr x 6  . sodium  chloride  10-40 mL Intracatheter Q12H  . vancomycin  750 mg Intravenous Q12H   Assessment: 74 yo female with hx of severe COPD admitted for HCAP and COPD exacerbation.  Treatment for acute on chronic respiratory failure with hypercapnia.  Pt is afebrile, WBC elevated but pt on steroids, not worrisome.    Last Vancomycin trough reported as 17 mcg/ml, therapeutic  Goal of Therapy:  Vancomycin trough level 15-20 mcg/ml  Plan:  Continue Zosyn 3.375 gm IV q8 hours   Continue Vancomycin 750 mg IV q12 hours  Deescalate ABX when improved / appropriate  Monitor renal function, cultures and clinical course  Duration of therapy per MD  Hart Robinsons, PharmD Clinical Pharmacist  11/20/2014,7:50 AM

## 2014-11-20 NOTE — Progress Notes (Signed)
Subjective: She is awake and alert and starting the weaning process again. She failed weaning yesterday because she became very anxious felt like she could not breathe and became more tachypnea and was restarted on ventilator support and given more sedation. She is hypokalemic again this morning and that's being replaced. Her low potassium may have had something to do with her inability to come off the ventilator but at least part of it seems to have been related to anxiety and she does have a history of significant anxiety at baseline  Objective: Vital signs in last 24 hours: Temp:  [96.4 F (35.8 C)-97.8 F (36.6 C)] 96.4 F (35.8 C) (09/25 0746) Pulse Rate:  [44-107] 68 (09/25 0700) Resp:  [12-20] 16 (09/25 0700) BP: (85-142)/(40-106) 101/54 mmHg (09/25 0700) SpO2:  [84 %-98 %] 92 % (09/25 0724) FiO2 (%):  [40 %] 40 % (09/25 0724) Weight:  [85 kg (187 lb 6.3 oz)] 85 kg (187 lb 6.3 oz) (09/25 0500) Weight change: 3 kg (6 lb 9.8 oz) Last BM Date: 11/19/14  Intake/Output from previous day: 09/24 0701 - 09/25 0700 In: 3803.6 [P.O.:495; I.V.:1193.6; NG/GT:1140; IV Piggyback:900] Out: 2450 [Urine:1350; Stool:1100]  PHYSICAL EXAM General appearance: alert, cooperative and Intubated on the ventilator Resp: rhonchi bilaterally Cardio: irregularly irregular rhythm GI: soft, non-tender; bowel sounds normal; no masses,  no organomegaly Extremities: extremities normal, atraumatic, no cyanosis or edema  Lab Results:  Results for orders placed or performed during the hospital encounter of 11/16/2014 (from the past 48 hour(s))  Glucose, capillary     Status: Abnormal   Collection Time: 11/18/14  9:48 AM  Result Value Ref Range   Glucose-Capillary 105 (H) 65 - 99 mg/dL   Comment 1 Document in Chart   Glucose, capillary     Status: Abnormal   Collection Time: 11/18/14 11:20 AM  Result Value Ref Range   Glucose-Capillary 101 (H) 65 - 99 mg/dL   Comment 1 Document in Chart   Glucose,  capillary     Status: Abnormal   Collection Time: 11/18/14  4:12 PM  Result Value Ref Range   Glucose-Capillary 161 (H) 65 - 99 mg/dL  Glucose, capillary     Status: Abnormal   Collection Time: 11/18/14  8:11 PM  Result Value Ref Range   Glucose-Capillary 123 (H) 65 - 99 mg/dL  C difficile quick scan w PCR reflex     Status: None   Collection Time: 11/19/14 12:02 AM  Result Value Ref Range   C Diff antigen NEGATIVE NEGATIVE   C Diff toxin NEGATIVE NEGATIVE   C Diff interpretation Negative for toxigenic C. difficile   Glucose, capillary     Status: Abnormal   Collection Time: 11/19/14 12:09 AM  Result Value Ref Range   Glucose-Capillary 144 (H) 65 - 99 mg/dL  Heparin level (unfractionated)     Status: Abnormal   Collection Time: 11/19/14  4:51 AM  Result Value Ref Range   Heparin Unfractionated 1.19 (H) 0.30 - 0.70 IU/mL    Comment:        IF HEPARIN RESULTS ARE BELOW EXPECTED VALUES, AND PATIENT DOSAGE HAS BEEN CONFIRMED, SUGGEST FOLLOW UP TESTING OF ANTITHROMBIN III LEVELS. RESULTS CONFIRMED BY MANUAL DILUTION Results Called to: PHILLIPS, C AT 0736 ON 09 RESULTS CONFIRMED BY MANUAL DILUTION Results Called to: [place] ON 11/19/2014 BY WOODS, M CORRECTED ON 09/24 AT 0736: PREVIOUSLY REPORTED AS >1.10        IF HEPARIN RESULTS ARE BELOW EXPECTED VALUES,  AND PATIENT DOSAGE HAS BEEN CONFIRMED, SUGGEST FOLLOW UP TESTING OF ANTITHROMBIN III LEVELS.   CBC     Status: Abnormal   Collection Time: 11/19/14  4:51 AM  Result Value Ref Range   WBC 13.4 (H) 4.0 - 10.5 K/uL   RBC 3.40 (L) 3.87 - 5.11 MIL/uL   Hemoglobin 9.2 (L) 12.0 - 15.0 g/dL   HCT 86.1 (L) 04.2 - 47.3 %   MCV 90.9 78.0 - 100.0 fL   MCH 27.1 26.0 - 34.0 pg   MCHC 29.8 (L) 30.0 - 36.0 g/dL   RDW 19.2 (H) 43.8 - 36.5 %   Platelets 216 150 - 400 K/uL  Basic metabolic panel     Status: Abnormal   Collection Time: 11/19/14  4:51 AM  Result Value Ref Range   Sodium 143 135 - 145 mmol/L   Potassium 2.2  (LL) 3.5 - 5.1 mmol/L    Comment: CRITICAL RESULT CALLED TO, READ BACK BY AND VERIFIED WITH: NIELSON T. AT 0644A ON [id number] BY THOMPSON S.    Chloride 95 (L) 101 - 111 mmol/L   CO2 37 (H) 22 - 32 mmol/L   Glucose, Bld 161 (H) 65 - 99 mg/dL   BUN 25 (H) 6 - 20 mg/dL   Creatinine, Ser 6.48 0.44 - 1.00 mg/dL   Calcium 7.0 (L) 8.9 - 10.3 mg/dL   GFR calc non Af Amer >60 >60 mL/min   GFR calc Af Amer >60 >60 mL/min    Comment: (NOTE) The eGFR has been calculated using the CKD EPI equation. This calculation has not been validated in all clinical situations. eGFR's persistently <60 mL/min signify possible Chronic Kidney Disease.    Anion gap 11 5 - 15  Glucose, capillary     Status: Abnormal   Collection Time: 11/19/14  4:54 AM  Result Value Ref Range   Glucose-Capillary 124 (H) 65 - 99 mg/dL  Heparin level (unfractionated)     Status: None   Collection Time: 11/19/14  7:22 AM  Result Value Ref Range   Heparin Unfractionated 0.67 0.30 - 0.70 IU/mL    Comment:        IF HEPARIN RESULTS ARE BELOW EXPECTED VALUES, AND PATIENT DOSAGE HAS BEEN CONFIRMED, SUGGEST FOLLOW UP TESTING OF ANTITHROMBIN III LEVELS.   Glucose, capillary     Status: Abnormal   Collection Time: 11/19/14  7:25 AM  Result Value Ref Range   Glucose-Capillary 121 (H) 65 - 99 mg/dL   Comment 1 Notify RN    Comment 2 Document in Chart   Glucose, capillary     Status: Abnormal   Collection Time: 11/19/14 11:40 AM  Result Value Ref Range   Glucose-Capillary 101 (H) 65 - 99 mg/dL   Comment 1 Notify RN    Comment 2 Document in Chart   Glucose, capillary     Status: Abnormal   Collection Time: 11/19/14  4:28 PM  Result Value Ref Range   Glucose-Capillary 101 (H) 65 - 99 mg/dL   Comment 1 Notify RN    Comment 2 Document in Chart   Glucose, capillary     Status: None   Collection Time: 11/19/14  7:37 PM  Result Value Ref Range   Glucose-Capillary 94 65 - 99 mg/dL  Glucose, capillary     Status: None   Collection  Time: 11/19/14 11:29 PM  Result Value Ref Range   Glucose-Capillary 90 65 - 99 mg/dL  Glucose, capillary     Status: Abnormal  Collection Time: 11/20/14  4:40 AM  Result Value Ref Range   Glucose-Capillary 137 (H) 65 - 99 mg/dL  Heparin level (unfractionated)     Status: None   Collection Time: 11/20/14  5:22 AM  Result Value Ref Range   Heparin Unfractionated 0.41 0.30 - 0.70 IU/mL    Comment:        IF HEPARIN RESULTS ARE BELOW EXPECTED VALUES, AND PATIENT DOSAGE HAS BEEN CONFIRMED, SUGGEST FOLLOW UP TESTING OF ANTITHROMBIN III LEVELS.   CBC     Status: Abnormal   Collection Time: 11/20/14  5:23 AM  Result Value Ref Range   WBC 14.1 (H) 4.0 - 10.5 K/uL   RBC 3.31 (L) 3.87 - 5.11 MIL/uL   Hemoglobin 9.0 (L) 12.0 - 15.0 g/dL   HCT 64.4 (L) 36.6 - 72.8 %   MCV 91.5 78.0 - 100.0 fL   MCH 27.2 26.0 - 34.0 pg   MCHC 29.7 (L) 30.0 - 36.0 g/dL   RDW 62.6 (H) 00.4 - 87.9 %   Platelets 207 150 - 400 K/uL  Basic metabolic panel     Status: Abnormal   Collection Time: 11/20/14  5:23 AM  Result Value Ref Range   Sodium 143 135 - 145 mmol/L   Potassium 2.8 (L) 3.5 - 5.1 mmol/L    Comment: DELTA CHECK NOTED   Chloride 98 (L) 101 - 111 mmol/L   CO2 38 (H) 22 - 32 mmol/L   Glucose, Bld 195 (H) 65 - 99 mg/dL   BUN 22 (H) 6 - 20 mg/dL   Creatinine, Ser 4.96 0.44 - 1.00 mg/dL   Calcium 7.2 (L) 8.9 - 10.3 mg/dL   GFR calc non Af Amer >60 >60 mL/min   GFR calc Af Amer >60 >60 mL/min    Comment: (NOTE) The eGFR has been calculated using the CKD EPI equation. This calculation has not been validated in all clinical situations. eGFR's persistently <60 mL/min signify possible Chronic Kidney Disease.    Anion gap 7 5 - 15  Magnesium     Status: None   Collection Time: 11/20/14  5:23 AM  Result Value Ref Range   Magnesium 1.8 1.7 - 2.4 mg/dL  Glucose, capillary     Status: Abnormal   Collection Time: 11/20/14  7:29 AM  Result Value Ref Range   Glucose-Capillary 153 (H) 65 - 99 mg/dL    Comment 1 Notify RN    Comment 2 Document in Chart     ABGS  Recent Labs  11/18/14 0600  PHART 7.353  PO2ART 70.7*  TCO2 12.2  HCO3 29.2*   CULTURES Recent Results (from the past 240 hour(s))  MRSA PCR Screening     Status: None   Collection Time: 11/03/2014 11:59 PM  Result Value Ref Range Status   MRSA by PCR NEGATIVE NEGATIVE Final    Comment:        The GeneXpert MRSA Assay (FDA approved for NASAL specimens only), is one component of a comprehensive MRSA colonization surveillance program. It is not intended to diagnose MRSA infection nor to guide or monitor treatment for MRSA infections.   C difficile quick scan w PCR reflex     Status: None   Collection Time: 11/19/14 12:02 AM  Result Value Ref Range Status   C Diff antigen NEGATIVE NEGATIVE Final   C Diff toxin NEGATIVE NEGATIVE Final   C Diff interpretation Negative for toxigenic C. difficile  Final   Studies/Results: No results found.  Medications:  Prior to Admission:  Prescriptions prior to admission  Medication Sig Dispense Refill Last Dose  . acetaminophen-codeine (TYLENOL #3) 300-30 MG per tablet Take 1 tablet by mouth every 4 (four) hours as needed for moderate pain.   unknown  . amiodarone (PACERONE) 200 MG tablet Take 200 mg by mouth 2 (two) times daily.    11/16/2014 at Unknown time  . aspirin EC 81 MG tablet Take 81 mg by mouth at bedtime.   11/10/2014 at Unknown time  . budesonide-formoterol (SYMBICORT) 160-4.5 MCG/ACT inhaler Inhale 2 puffs into the lungs 2 (two) times daily. 1 Inhaler 6 11/14/2014 at Unknown time  . clonazePAM (KLONOPIN) 0.5 MG tablet Take 0.25-0.5 mg by mouth every 6 (six) hours as needed for anxiety.    11/20/2014 at Unknown time  . clopidogrel (PLAVIX) 75 MG tablet Take 1 tablet (75 mg total) by mouth at bedtime.   11/10/2014 at Unknown time  . diltiazem (CARDIZEM CD) 300 MG 24 hr capsule TAKE 1 CAPSULE IN THE EVENING. 30 capsule 1 11/10/2014 at Unknown time  . furosemide (LASIX)  40 MG tablet Take 1 tablet (40 mg total) by mouth daily. (Patient taking differently: Take 40 mg by mouth every morning. ) 30 tablet 3 11/04/2014 at Unknown time  . guaiFENesin (MUCINEX) 600 MG 12 hr tablet Take 1 tablet by mouth every 12 hours as needed for cough and congestion   11/16/2014 at Unknown time  . ipratropium (ATROVENT) 0.02 % nebulizer solution USE 1 VIAL IN NEBULIZER EVERY 6 HOURS. (Patient taking differently: USE 1 VIAL IN NEBULIZER EVERY 4 HOURS FOR SHORTNESS OF BREATH/WHEEZING) 300 mL 1 UNKNOWN  . isosorbide mononitrate (IMDUR) 30 MG 24 hr tablet TAKE ONE TABLET BY MOUTH AT BEDTIME. (Patient taking differently: TAKE ONE TABLET BY MOUTH EVERY MORNING) 30 tablet 5 11/23/2014 at Unknown time  . latanoprost (XALATAN) 0.005 % ophthalmic solution Place 1 drop into both eyes at bedtime.   11/01/2014  . levalbuterol (XOPENEX HFA) 45 MCG/ACT inhaler Inhale 2 puffs into the lungs every 4 (four) hours as needed for wheezing. (Patient taking differently: Inhale 2 puffs into the lungs every 4 (four) hours as needed for wheezing or shortness of breath. ) 15 g 6 Past Month at Unknown time  . mirtazapine (REMERON) 15 MG tablet Take 7.5 mg by mouth at bedtime. Take 1/2 tablet by mouth at bedtime   11/10/2014 at Unknown time  . nitroGLYCERIN (NITROSTAT) 0.4 MG SL tablet Place 0.4 mg under the tongue every 5 (five) minutes as needed for chest pain (may repeat x3). Chest pain   11/10/2014 at Unknown time  . nystatin (MYCOSTATIN) 100000 UNIT/ML suspension Take 5 mLs by mouth 4 (four) times daily.    11/09/2014 at Unknown time  . omeprazole (PRILOSEC) 20 MG capsule Take 20 mg by mouth daily as needed (for acid reflux).    unknown  . ondansetron (ZOFRAN) 8 MG tablet TAKE 1/2 TABLET ONCE DAILY AS NEEDED FOR NAUSEA   11/16/2014 at Unknown time  . OXYGEN Inhale 4.5 L into the lungs continuous.    11/05/2014 at Unknown time  . potassium chloride SA (K-DUR,KLOR-CON) 20 MEQ tablet Take 2 tablets by mouth every morning    11/22/2014 at Unknown time  . pravastatin (PRAVACHOL) 80 MG tablet Take 80 mg by mouth at bedtime.   11/10/2014 at Unknown time  . ranitidine (ZANTAC) 150 MG capsule Take 150 mg by mouth daily as needed for heartburn.    unknown at Unknown time  . tamsulosin (  FLOMAX) 0.4 MG CAPS capsule TAKE 1 CAPSULE AT BEDTIME FOR INCONTINENCE.   11/10/2014 at Unknown time   Scheduled: . antiseptic oral rinse  7 mL Mouth Rinse BID  . antiseptic oral rinse  7 mL Mouth Rinse QID  . aspirin  300 mg Rectal Daily  . budesonide (PULMICORT) nebulizer solution  0.25 mg Nebulization BID  . chlorhexidine gluconate  15 mL Mouth Rinse BID  . cyanocobalamin  1,000 mcg Intramuscular Daily  . feeding supplement (PRO-STAT SUGAR FREE 64)  30 mL Per Tube Daily  . insulin aspart  0-15 Units Subcutaneous 6 times per day  . insulin glargine  7 Units Subcutaneous BID  . latanoprost  1 drop Both Eyes QHS  . levalbuterol  0.63 mg Nebulization Q6H  . methylPREDNISolone (SOLU-MEDROL) injection  60 mg Intravenous Q24H  . pantoprazole (PROTONIX) IV  40 mg Intravenous Daily  . piperacillin-tazobactam (ZOSYN)  IV  3.375 g Intravenous Q8H  . potassium chloride  10 mEq Intravenous Q1 Hr x 6  . sodium chloride  10-40 mL Intracatheter Q12H  . vancomycin  750 mg Intravenous Q12H   Continuous: . 0.9 % NaCl with KCl 20 mEq / L 1,000 mL (11/20/14 0705)  . amiodarone 30 mg/hr (11/19/14 2251)  . feeding supplement (VITAL AF 1.2 CAL) 1,000 mL (11/18/14 1957)  . fentaNYL infusion INTRAVENOUS 100 mcg/hr (11/20/14 0707)  . heparin 1,000 Units/hr (11/19/14 1604)  . midazolam (VERSED) infusion 0.5 mg/hr (11/20/14 0707)   TDS:[id number], fentaNYL (SUBLIMAZE) injection, fentaNYL (SUBLIMAZE) injection, ipratropium, LORazepam, midazolam, midazolam, ondansetron **OR** ondansetron (ZOFRAN) IV, sodium chloride  Assesment: She was admitted with healthcare associated pneumonia and was initially improving then she developed acute on chronic respiratory  failure requiring intubation and mechanical ventilation. She failed weaning yesterday because of a combination of fatigue possible hypokalemia and anxiety. She has first set ordered but that seems to drop her blood pressure somewhat to try some low-dose Ativan to see if we can make her less anxious during the weaning process. Her potassium is being replaced and a magnesium level is pending. Principal Problem:   HCAP (healthcare-associated pneumonia) Active Problems:   Essential hypertension   Acute exacerbation of chronic obstructive pulmonary disease (COPD)   Acute on chronic respiratory failure with hypercapnia   Chronic combined systolic and diastolic congestive heart failure   Ischemic cardiomyopathy   Hyperglycemia, drug-induced   Atrial flutter   Obesity   Hypotension   Anemia, iron deficiency   Vitamin B12 deficiency   AAA (abdominal aortic aneurysm) without rupture   Acute on chronic systolic heart failure    Plan: Attempt weaning today. She remains critically ill on ventilator support.    LOS: 9 days   HAWKINS,EDWARD L 11/20/2014, 8:04 AM

## 2014-11-20 NOTE — Progress Notes (Signed)
[place] for Heparin Indication: atrial fibrillation  Allergies  Allergen Reactions  . Albuterol Other (See Comments)    Jittery/shakiness  . Levaquin [Levofloxacin In D5w] Nausea Only  . Sulfa Antibiotics Nausea Only   Patient Measurements: Height: 5\' 3"  (160 cm) Weight: 187 lb 6.3 oz (85 kg) IBW/kg (Calculated) : 52.4 HEPARIN DW (KG): 68.3  Vital Signs: Temp: 97.2 F (36.2 C) (09/25 0400) Temp Source: Axillary (09/25 0400) BP: 101/54 mmHg (09/25 0700) Pulse Rate: 68 (09/25 0700)  Labs:  Recent Labs  11/18/14 0415 11/19/14 0451 11/19/14 0722 11/20/14 0522 11/20/14 0523  HGB 9.6* 9.2*  --   --  9.0*  HCT 32.2* 30.9*  --   --  30.3*  PLT 241 216  --   --  207  HEPARINUNFRC 0.50 1.19* 0.67 0.41  --   CREATININE 0.64 0.47  --   --  0.47   Estimated Creatinine Clearance: 64.7 mL/min (by C-G formula based on Cr of 0.47).  Medications:  Scheduled:  . antiseptic oral rinse  7 mL Mouth Rinse BID  . antiseptic oral rinse  7 mL Mouth Rinse QID  . aspirin  300 mg Rectal Daily  . budesonide (PULMICORT) nebulizer solution  0.25 mg Nebulization BID  . chlorhexidine gluconate  15 mL Mouth Rinse BID  . cyanocobalamin  1,000 mcg Intramuscular Daily  . feeding supplement (PRO-STAT SUGAR FREE 64)  30 mL Per Tube Daily  . insulin aspart  0-15 Units Subcutaneous 6 times per day  . insulin glargine  7 Units Subcutaneous BID  . latanoprost  1 drop Both Eyes QHS  . levalbuterol  0.63 mg Nebulization Q6H  . methylPREDNISolone (SOLU-MEDROL) injection  60 mg Intravenous Q24H  . pantoprazole (PROTONIX) IV  40 mg Intravenous Daily  . piperacillin-tazobactam (ZOSYN)  IV  3.375 g Intravenous Q8H  . potassium chloride  10 mEq Intravenous Q1 Hr x 6  . sodium chloride  10-40 mL Intracatheter Q12H  . vancomycin  750 mg Intravenous Q12H   Assessment: Okay for Protocol, PT/INR WNL on 9/16.  H/H stable.  HL is therapeutic.  Goal of Therapy:  Heparin  level 0.3-0.7 units/ml Monitor platelets by anticoagulation protocol: Yes   Plan:  Continue Heparin drip @ 1000 units/hr Daily Hep Level and CBC while on Heparin  Gilda Abboud A 11/20/2014,7:45 AM

## 2014-11-20 NOTE — Progress Notes (Signed)
[facility] PROGRESS NOTE  KEYRY IRACHETA ZOX:[id number] DOB: [birth year] DOA: 11/03/2014 PCP: Sherrie Mustache, MD  Assessment/Plan: Acute on chronic ventilatory dependent hypoxemic and hypercapnic respiratory failure -MF: COPD exacerbation, HCAP, acute CHF. See below for details. -Remains intubated today, will re-attempt weaning today as she is much more alert and responsive. -Failed weaning attempts yesterday mainly because of anxiety causing tachypnea and SOB. Has been started on low dose ativan to help relieve anxiety during the weaning process. -Appreciate Dr. Luan Pulling input and recommendations.  Healthcare associated pneumonia -Continue vancomycin and Zosyn for now. -Consider narrowing once back on oral regimen. -Recheck CXR today.  Ischemic cardiomyopathy with acute combined CHF -Ejection fraction of 30-35% with diffuse hypokinesis. -Currently appears volume overloaded. -Agree with IV Lasix to try and diurese her. Started on 40 mg IV BID. -UOP 2450 cc overnight, although overall Is and Os remain positive due to multiple drips.  Atrial fibrillation with rapid ventricular response -Continue amiodarone and heparin drips today. -Plan to transition to oral amiodarone once extubated and taking by mouth's. -If rate controlling agent is required would use Toprol-XL as per cardiology recommendations.  -Plan to transition to Eliquis for anticoagulation once extubated.  Hypotension -Resolved. -Probably due to diltiazem and possibly sepsis. -Did require pressors, these have now been discontinued.  Acute on chronic COPD -No significant bronchospasm on exam, continue to titrate steroids. -Continue as needed Xopenex.  AAA -Increased aneurysm to 5.1 cm compared to 3.5 cm in 2011. -To see vascular surgery following discharge.  Iron deficiency/ B 12 deficiency anemia -Start ferrous sulfate once taking by mouth's. -Continue daily vitamin B-12 IV supplementation for a week  and then monthly thereafter.   Code Status: Full code Family Communication: No family at bedside this am. Disposition Plan: Attempt extubation this afternoon   Consultants:  Cardiology   Pulmonary   Antibiotics:  Vancomycin    Zosyn  Subjective: Intubated, awake.  Objective: Filed Vitals:   11/20/14 0724 11/20/14 0746 11/20/14 0800 11/20/14 0900  BP:   140/88 114/51  Pulse:   79 84  Temp:  96.4 F (35.8 C)    TempSrc:  Axillary    Resp:   14 10  Height:      Weight:      SpO2: 92%  80% 94%    Intake/Output Summary (Last 24 hours) at 11/20/14 0934 Last data filed at 11/20/14 0902  Gross per 24 hour  Intake 3638.58 ml  Output   2450 ml  Net 1188.58 ml   Filed Weights   11/18/14 0500 11/19/14 0500 11/20/14 0500  Weight: 82.9 kg (182 lb 12.2 oz) 82 kg (180 lb 12.4 oz) 85 kg (187 lb 6.3 oz)    Exam:   General:  Awake  Cardiovascular: irregular  Respiratory: CTA B  Abdomen: S/NT/ND/+BS  Extremities: 2-3++ edema   Neurologic:  Moves all 4  Data Reviewed: Basic Metabolic Panel:  Recent Labs Lab 11/16/14 0415 11/17/14 0440 11/18/14 0415 11/19/14 0451 11/20/14 0523  NA 140 142 140 143 143  K 3.5 3.2* 3.3* 2.2* 2.8*  CL 102 103 100* 95* 98*  CO2 32 33* 33* 37* 38*  GLUCOSE 109* 115* 173* 161* 195*  BUN 18 17 27* 25* 22*  CREATININE 0.54 0.50 0.64 0.47 0.47  CALCIUM 7.8* 7.6* 7.5* 7.0* 7.2*  MG  --   --   --   --  1.8   Liver Function Tests:  Recent Labs Lab 11/15/14 0415  AST 64*  ALT 40  ALKPHOS 55  BILITOT 0.8  PROT 5.5*  ALBUMIN 2.8*   No results for input(s): LIPASE, AMYLASE in the last 168 hours. No results for input(s): AMMONIA in the last 168 hours. CBC:  Recent Labs Lab 11/16/14 0415 11/17/14 0440 11/18/14 0415 11/19/14 0451 11/20/14 0523  WBC 6.0 5.7 13.1* 13.4* 14.1*  HGB 9.5* 9.6* 9.6* 9.2* 9.0*  HCT 31.3* 31.9* 32.2* 30.9* 30.3*  MCV 90.5 90.1 90.4 90.9 91.5  PLT 224 242 241 216 207   Cardiac  Enzymes:  Recent Labs Lab 11/14/14 0833 11/14/14 1425 11/14/14 2000  TROPONINI <0.03 0.05* 0.07*   BNP (last 3 results)  Recent Labs  04/26/14 1358 11/20/2014 1903 11/16/14 0415  BNP 294.0* 549.0* 1064.0*    ProBNP (last 3 results)  Recent Labs  12/29/13 1238 04/15/14 1417 10/18/14 1456  PROBNP 195.0* 230.0* 288.0*    CBG:  Recent Labs Lab 11/19/14 1628 11/19/14 1937 11/19/14 2329 11/20/14 0440 11/20/14 0729  GLUCAP 101* 94 90 137* 153*    Recent Results (from the past 240 hour(s))  MRSA PCR Screening     Status: None   Collection Time: 11/10/2014 11:59 PM  Result Value Ref Range Status   MRSA by PCR NEGATIVE NEGATIVE Final    Comment:        The GeneXpert MRSA Assay (FDA approved for NASAL specimens only), is one component of a comprehensive MRSA colonization surveillance program. It is not intended to diagnose MRSA infection nor to guide or monitor treatment for MRSA infections.   C difficile quick scan w PCR reflex     Status: None   Collection Time: 11/19/14 12:02 AM  Result Value Ref Range Status   C Diff antigen NEGATIVE NEGATIVE Final   C Diff toxin NEGATIVE NEGATIVE Final   C Diff interpretation Negative for toxigenic C. difficile  Final     Studies: No results found.  Scheduled Meds: . antiseptic oral rinse  7 mL Mouth Rinse BID  . antiseptic oral rinse  7 mL Mouth Rinse QID  . budesonide (PULMICORT) nebulizer solution  0.25 mg Nebulization BID  . chlorhexidine gluconate  15 mL Mouth Rinse BID  . cyanocobalamin  1,000 mcg Intramuscular Daily  . feeding supplement (PRO-STAT SUGAR FREE 64)  30 mL Per Tube Daily  . furosemide  40 mg Intravenous Q12H  . insulin aspart  0-15 Units Subcutaneous 6 times per day  . insulin glargine  7 Units Subcutaneous BID  . latanoprost  1 drop Both Eyes QHS  . levalbuterol  0.63 mg Nebulization Q6H  . methylPREDNISolone (SOLU-MEDROL) injection  30 mg Intravenous Q24H  . pantoprazole (PROTONIX) IV  40 mg  Intravenous Daily  . piperacillin-tazobactam (ZOSYN)  IV  3.375 g Intravenous Q8H  . potassium chloride  10 mEq Intravenous Q1 Hr x 6  . sodium chloride  10-40 mL Intracatheter Q12H  . vancomycin  750 mg Intravenous Q12H   Continuous Infusions: . amiodarone 30 mg/hr (11/20/14 0701)  . feeding supplement (VITAL AF 1.2 CAL) 1,000 mL (11/20/14 0701)  . fentaNYL infusion INTRAVENOUS 50 mcg/hr (11/20/14 0801)  . heparin 1,000 Units/hr (11/20/14 0701)  . midazolam (VERSED) infusion 0.5 mg/hr (11/20/14 0707)    Principal Problem:   HCAP (healthcare-associated pneumonia) Active Problems:   Essential hypertension   Acute exacerbation of chronic obstructive pulmonary disease (COPD)   Acute on chronic respiratory failure with hypercapnia   Chronic combined systolic and diastolic congestive heart failure   Ischemic cardiomyopathy   Hyperglycemia, drug-induced  Atrial flutter   Obesity   Hypotension   Anemia, iron deficiency   Vitamin B12 deficiency   AAA (abdominal aortic aneurysm) without rupture   Acute on chronic systolic heart failure    Critical Care time: 35 minutes. Greater than 50% of this time was spent in direct contact with the patient coordinating care.    Lelon Frohlich  [facility] Pager [phone]  If 7PM-7AM, please contact night-coverage at [email or web address], password [facility] 11/20/2014, 9:34 AM  LOS: 9 days

## 2014-11-21 ENCOUNTER — Inpatient Hospital Stay (HOSPITAL_COMMUNITY): Payer: Medicare Other

## 2014-11-21 DIAGNOSIS — J189 Pneumonia, unspecified organism: Principal | ICD-10-CM

## 2014-11-21 DIAGNOSIS — I714 Abdominal aortic aneurysm, without rupture, unspecified: Secondary | ICD-10-CM | POA: Insufficient documentation

## 2014-11-21 LAB — BASIC METABOLIC PANEL
Anion gap: 9 (ref 5–15)
BUN: 21 mg/dL — AB (ref 6–20)
CALCIUM: 7.6 mg/dL — AB (ref 8.9–10.3)
CO2: 40 mmol/L — ABNORMAL HIGH (ref 22–32)
Chloride: 94 mmol/L — ABNORMAL LOW (ref 101–111)
Creatinine, Ser: 0.48 mg/dL (ref 0.44–1.00)
GFR calc Af Amer: >60 mL/min (ref >60)
GLUCOSE: 164 mg/dL — AB (ref 65–99)
Potassium: 3.6 mmol/L (ref 3.5–5.1)
Sodium: 143 mmol/L (ref 135–145)

## 2014-11-21 LAB — GLUCOSE, CAPILLARY
GLUCOSE-CAPILLARY: 108 mg/dL — AB (ref 65–99)
GLUCOSE-CAPILLARY: 124 mg/dL — AB (ref 65–99)
GLUCOSE-CAPILLARY: 89 mg/dL (ref 65–99)
GLUCOSE-CAPILLARY: 96 mg/dL (ref 65–99)
Glucose-Capillary: 145 mg/dL — ABNORMAL HIGH (ref 65–99)
Glucose-Capillary: 98 mg/dL (ref 65–99)

## 2014-11-21 LAB — TROPONIN I: Troponin I: <0.03 ng/mL (ref <0.031)

## 2014-11-21 LAB — CBC
HEMATOCRIT: 32.5 % — AB (ref 36.0–46.0)
Hemoglobin: 9.5 g/dL — ABNORMAL LOW (ref 12.0–15.0)
MCH: 26.9 pg (ref 26.0–34.0)
MCHC: 29.2 g/dL — ABNORMAL LOW (ref 30.0–36.0)
MCV: 92.1 fL (ref 78.0–100.0)
PLATELETS: 241 10*3/uL (ref 150–400)
RBC: 3.53 MIL/uL — ABNORMAL LOW (ref 3.87–5.11)
RDW: 16.6 % — ABNORMAL HIGH (ref 11.5–15.5)
WBC: 22.2 10*3/uL — ABNORMAL HIGH (ref 4.0–10.5)

## 2014-11-21 LAB — BLOOD GAS, ARTERIAL
ACID-BASE EXCESS: 14.3 mmol/L — AB (ref 0.0–2.0)
Bicarbonate: 36.5 mEq/L — ABNORMAL HIGH (ref 20.0–24.0)
DRAWN BY: 25788
FIO2: 40
MODE: POSITIVE
O2 Saturation: 87.2 %
PCO2 ART: 67.5 mmHg — AB (ref 35.0–45.0)
PEEP/CPAP: 5 cmH2O
Pressure support: 5 cmH2O
pH, Arterial: 7.39 (ref 7.350–7.450)
pO2, Arterial: 60.2 mmHg — ABNORMAL LOW (ref 80.0–100.0)

## 2014-11-21 LAB — BRAIN NATRIURETIC PEPTIDE: B NATRIURETIC PEPTIDE 5: 571 pg/mL — AB (ref 0.0–100.0)

## 2014-11-21 LAB — HEPARIN LEVEL (UNFRACTIONATED): HEPARIN UNFRACTIONATED: 0.42 [IU]/mL (ref 0.30–0.70)

## 2014-11-21 MED ORDER — FUROSEMIDE 10 MG/ML IJ SOLN
80.0000 mg | Freq: Two times a day (BID) | INTRAMUSCULAR | Status: AC
  Administered 2014-11-21: 80 mg via INTRAVENOUS
  Filled 2014-11-21: qty 8

## 2014-11-21 MED ORDER — FUROSEMIDE 10 MG/ML IJ SOLN
40.0000 mg | Freq: Once | INTRAMUSCULAR | Status: AC
  Administered 2014-11-21: 40 mg via INTRAVENOUS
  Filled 2014-11-21: qty 4

## 2014-11-21 NOTE — Progress Notes (Addendum)
Patient has slept through out night. Stayed on wean from [place] till 1445 yesterday. Potassium up low normal. Suspect this is about as good as it will be for wean. Most likely will need BiPAP after extubation. PCO2 will be 70's , compensated. Will need anxiety medicine. I/O still positive almost 5000 cc with lasix.

## 2014-11-21 NOTE — Progress Notes (Signed)
Pt followed simple direction, ABG 7.39, 67.5, PO260.2, HCO3 36.5, SO2 87.2. Pt followed simple directions, NIF -21, FVC .9L, leak test positive. Pt was extubated to [facility] . Pt has a good cough with no secretions.Marland Kitchen SATS 97%, HR 111, HR 110

## 2014-11-21 NOTE — Progress Notes (Signed)
Subjective: She is awake and alert and writing notes. She does not appear to be in any distress. She was unable to be weaned and extubated yesterday  Objective: Vital signs in last 24 hours: Temp:  [96 F (35.6 C)-97.9 F (36.6 C)] 97.9 F (36.6 C) (09/26 0751) Pulse Rate:  [35-113] 113 (09/26 0815) Resp:  [9-33] 20 (09/26 0815) BP: (59-137)/(8-104) 111/74 mmHg (09/26 0815) SpO2:  [80 %-100 %] 94 % (09/26 0815) FiO2 (%):  [40 %] 40 % (09/26 0800) Weight:  [84.6 kg (186 lb 8.2 oz)] 84.6 kg (186 lb 8.2 oz) (09/26 0500) Weight change: -0.4 kg (-14.1 oz) Last BM Date: 11/20/14  Intake/Output from previous day: 09/25 0701 - 09/26 0700 In: 3353.7 [I.V.:1038.7; NG/GT:1140; IV Piggyback:1100] Out: 3335 [Urine:2475; Stool:860]  PHYSICAL EXAM General appearance: alert and no distress Resp: rhonchi bilaterally Cardio: irregularly irregular rhythm GI: soft, non-tender; bowel sounds normal; no masses,  no organomegaly Extremities: extremities normal, atraumatic, no cyanosis or edema  Lab Results:  Results for orders placed or performed during the hospital encounter of 11/10/2014 (from the past 48 hour(s))  Glucose, capillary     Status: Abnormal   Collection Time: 11/19/14 11:40 AM  Result Value Ref Range   Glucose-Capillary 101 (H) 65 - 99 mg/dL   Comment 1 Notify RN    Comment 2 Document in Chart   Glucose, capillary     Status: Abnormal   Collection Time: 11/19/14  4:28 PM  Result Value Ref Range   Glucose-Capillary 101 (H) 65 - 99 mg/dL   Comment 1 Notify RN    Comment 2 Document in Chart   Glucose, capillary     Status: None   Collection Time: 11/19/14  7:37 PM  Result Value Ref Range   Glucose-Capillary 94 65 - 99 mg/dL  Glucose, capillary     Status: None   Collection Time: 11/19/14 11:29 PM  Result Value Ref Range   Glucose-Capillary 90 65 - 99 mg/dL  Glucose, capillary     Status: Abnormal   Collection Time: 11/20/14  4:40 AM  Result Value Ref Range    Glucose-Capillary 137 (H) 65 - 99 mg/dL  Heparin level (unfractionated)     Status: None   Collection Time: 11/20/14  5:22 AM  Result Value Ref Range   Heparin Unfractionated 0.41 0.30 - 0.70 IU/mL    Comment:        IF HEPARIN RESULTS ARE BELOW EXPECTED VALUES, AND PATIENT DOSAGE HAS BEEN CONFIRMED, SUGGEST FOLLOW UP TESTING OF ANTITHROMBIN III LEVELS.   CBC     Status: Abnormal   Collection Time: 11/20/14  5:23 AM  Result Value Ref Range   WBC 14.1 (H) 4.0 - 10.5 K/uL   RBC 3.31 (L) 3.87 - 5.11 MIL/uL   Hemoglobin 9.0 (L) 12.0 - 15.0 g/dL   HCT 30.3 (L) 36.0 - 46.0 %   MCV 91.5 78.0 - 100.0 fL   MCH 27.2 26.0 - 34.0 pg   MCHC 29.7 (L) 30.0 - 36.0 g/dL   RDW 16.3 (H) 11.5 - 15.5 %   Platelets 207 150 - 400 K/uL  Basic metabolic panel     Status: Abnormal   Collection Time: 11/20/14  5:23 AM  Result Value Ref Range   Sodium 143 135 - 145 mmol/L   Potassium 2.8 (L) 3.5 - 5.1 mmol/L    Comment: DELTA CHECK NOTED   Chloride 98 (L) 101 - 111 mmol/L   CO2 38 (H) 22 - 32 mmol/L  Glucose, Bld 195 (H) 65 - 99 mg/dL   BUN 22 (H) 6 - 20 mg/dL   Creatinine, Ser 0.47 0.44 - 1.00 mg/dL   Calcium 7.2 (L) 8.9 - 10.3 mg/dL   GFR calc non Af Amer >60 >60 mL/min   GFR calc Af Amer >60 >60 mL/min    Comment: (NOTE) The eGFR has been calculated using the CKD EPI equation. This calculation has not been validated in all clinical situations. eGFR's persistently <60 mL/min signify possible Chronic Kidney Disease.    Anion gap 7 5 - 15  Magnesium     Status: None   Collection Time: 11/20/14  5:23 AM  Result Value Ref Range   Magnesium 1.8 1.7 - 2.4 mg/dL  Glucose, capillary     Status: Abnormal   Collection Time: 11/20/14  7:29 AM  Result Value Ref Range   Glucose-Capillary 153 (H) 65 - 99 mg/dL   Comment 1 Notify RN    Comment 2 Document in Chart   Glucose, capillary     Status: Abnormal   Collection Time: 11/20/14 11:59 AM  Result Value Ref Range   Glucose-Capillary 160 (H) 65 -  99 mg/dL  Blood gas, arterial     Status: Abnormal   Collection Time: 11/20/14  2:30 PM  Result Value Ref Range   FIO2 40.00    Delivery systems VENTILATOR    Mode CONTINUOUS POSITIVE AIRWAY PRESSURE    Pressure support 5.0 cm H20   pH, Arterial 7.302 (L) 7.350 - 7.450   pCO2 arterial 82.8 (HH) 35.0 - 45.0 mmHg    Comment: CRITICAL RESULT CALLED TO, READ BACK BY AND VERIFIED WITH: LEIGH ANN SCHONEWITZ,RN AT 1436 BY VANESSA LAWSON RRT,RCP 11/20/14    pO2, Arterial 56.3 (L) 80.0 - 100.0 mmHg   Bicarbonate 34.6 (H) 20.0 - 24.0 mEq/L   TCO2 11.5 0 - 100 mmol/L   Acid-Base Excess 13.0 (H) 0.0 - 2.0 mmol/L   O2 Saturation 81.3 %   Patient temperature 37.0    Collection site LEFT RADIAL    Drawn by [id number]    Sample type ARTERIAL    Allens test (pass/fail) PASS PASS  Glucose, capillary     Status: Abnormal   Collection Time: 11/20/14  4:24 PM  Result Value Ref Range   Glucose-Capillary 115 (H) 65 - 99 mg/dL   Comment 1 Notify RN    Comment 2 Document in Chart   Glucose, capillary     Status: Abnormal   Collection Time: 11/20/14  7:30 PM  Result Value Ref Range   Glucose-Capillary 122 (H) 65 - 99 mg/dL  Troponin I (q 6hr x 3)     Status: None   Collection Time: 11/20/14  9:36 PM  Result Value Ref Range   Troponin I <0.03 <0.031 ng/mL    Comment:        NO INDICATION OF MYOCARDIAL INJURY.   Potassium     Status: Abnormal   Collection Time: 11/20/14  9:36 PM  Result Value Ref Range   Potassium 3.3 (L) 3.5 - 5.1 mmol/L  Glucose, capillary     Status: Abnormal   Collection Time: 11/20/14 11:58 PM  Result Value Ref Range   Glucose-Capillary 108 (H) 65 - 99 mg/dL  Troponin I (q 6hr x 3)     Status: None   Collection Time: 11/21/14  3:20 AM  Result Value Ref Range   Troponin I <0.03 <0.031 ng/mL    Comment:  NO INDICATION OF MYOCARDIAL INJURY.   Glucose, capillary     Status: Abnormal   Collection Time: 11/21/14  4:12 AM  Result Value Ref Range   Glucose-Capillary 145  (H) 65 - 99 mg/dL  Heparin level (unfractionated)     Status: None   Collection Time: 11/21/14  5:05 AM  Result Value Ref Range   Heparin Unfractionated 0.42 0.30 - 0.70 IU/mL    Comment:        IF HEPARIN RESULTS ARE BELOW EXPECTED VALUES, AND PATIENT DOSAGE HAS BEEN CONFIRMED, SUGGEST FOLLOW UP TESTING OF ANTITHROMBIN III LEVELS.   CBC     Status: Abnormal   Collection Time: 11/21/14  5:05 AM  Result Value Ref Range   WBC 22.2 (H) 4.0 - 10.5 K/uL   RBC 3.53 (L) 3.87 - 5.11 MIL/uL   Hemoglobin 9.5 (L) 12.0 - 15.0 g/dL   HCT 32.5 (L) 36.0 - 46.0 %   MCV 92.1 78.0 - 100.0 fL   MCH 26.9 26.0 - 34.0 pg   MCHC 29.2 (L) 30.0 - 36.0 g/dL   RDW 16.6 (H) 11.5 - 15.5 %   Platelets 241 150 - 400 K/uL  Brain natriuretic peptide     Status: Abnormal   Collection Time: 11/21/14  5:05 AM  Result Value Ref Range   B Natriuretic Peptide 571.0 (H) 0.0 - 100.0 pg/mL  Basic metabolic panel     Status: Abnormal   Collection Time: 11/21/14  5:05 AM  Result Value Ref Range   Sodium 143 135 - 145 mmol/L   Potassium 3.6 3.5 - 5.1 mmol/L   Chloride 94 (L) 101 - 111 mmol/L   CO2 40 (H) 22 - 32 mmol/L   Glucose, Bld 164 (H) 65 - 99 mg/dL   BUN 21 (H) 6 - 20 mg/dL   Creatinine, Ser 0.48 0.44 - 1.00 mg/dL   Calcium 7.6 (L) 8.9 - 10.3 mg/dL   GFR calc non Af Amer >60 >60 mL/min   GFR calc Af Amer >60 >60 mL/min    Comment: (NOTE) The eGFR has been calculated using the CKD EPI equation. This calculation has not been validated in all clinical situations. eGFR's persistently <60 mL/min signify possible Chronic Kidney Disease.    Anion gap 9 5 - 15  Glucose, capillary     Status: Abnormal   Collection Time: 11/21/14  7:10 AM  Result Value Ref Range   Glucose-Capillary 124 (H) 65 - 99 mg/dL   Comment 1 Notify RN    Comment 2 Document in Chart     ABGS  Recent Labs  11/20/14 1430  PHART 7.302*  PO2ART 56.3*  TCO2 11.5  HCO3 34.6*   CULTURES Recent Results (from the past 240 hour(s))   MRSA PCR Screening     Status: None   Collection Time: 11/01/2014 11:59 PM  Result Value Ref Range Status   MRSA by PCR NEGATIVE NEGATIVE Final    Comment:        The GeneXpert MRSA Assay (FDA approved for NASAL specimens only), is one component of a comprehensive MRSA colonization surveillance program. It is not intended to diagnose MRSA infection nor to guide or monitor treatment for MRSA infections.   C difficile quick scan w PCR reflex     Status: None   Collection Time: 11/19/14 12:02 AM  Result Value Ref Range Status   C Diff antigen NEGATIVE NEGATIVE Final   C Diff toxin NEGATIVE NEGATIVE Final   C Diff interpretation Negative  for toxigenic C. difficile  Final   Studies/Results: Dg Chest 1 View  11/21/2014   CLINICAL DATA:  Shortness of breath, respiratory failure, ventilatory support  EXAM: CHEST 1 VIEW  COMPARISON:  04/21/2014  FINDINGS: Endotracheal tube 4.5 cm above the carina. NG tube enters the stomach with the tip not visualized. Monitor leads overlie the chest. Exam is rotated to the left. Heart remains enlarged with small to moderate pleural effusions, larger on the left. Left greater than right basilar collapse/consolidation persist. No pneumothorax. Stable right PICC line position. Atherosclerosis noted of the aorta.  IMPRESSION: No significant change in left greater than right basilar collapse/ consolidation and associated pleural effusions.   Electronically Signed   By: Jerilynn Mages.  Shick M.D.   On: 11/21/2014 08:04   Dg Chest Port 1 View  11/20/2014   CLINICAL DATA:  Patient with shortness of breath. History of congestive heart failure.  EXAM: PORTABLE CHEST 1 VIEW  COMPARISON:  Chest radiograph 11/18/2014  FINDINGS: Enteric tube courses to the inferior aspect of the radiograph, tip not included on current examination. Side-port appears at the GE junction. ET tube terminates in the mid trachea. Stable enlarged cardiac and mediastinal contours with moderate left and small right  pleural effusions. Underlying left-greater-than-right pulmonary consolidation. No definite pneumothorax. Right upper extremity PICC line is present, unchanged from prior.  IMPRESSION: Interval advancement of enteric tube. The side port may be at the GE junction, consider advancement and correlation with abdominal radiography.  ET tube terminates in the mid trachea.  Persistent moderate left and small right pleural effusions with underlying pulmonary consolidation likely representing edema and atelectasis. Infection not excluded.   Electronically Signed   By: Lovey Newcomer M.D.   On: 11/20/2014 10:20    Medications:  Prior to Admission:  Prescriptions prior to admission  Medication Sig Dispense Refill Last Dose  . acetaminophen-codeine (TYLENOL #3) 300-30 MG per tablet Take 1 tablet by mouth every 4 (four) hours as needed for moderate pain.   unknown  . amiodarone (PACERONE) 200 MG tablet Take 200 mg by mouth 2 (two) times daily.    11/23/2014 at Unknown time  . aspirin EC 81 MG tablet Take 81 mg by mouth at bedtime.   11/10/2014 at Unknown time  . budesonide-formoterol (SYMBICORT) 160-4.5 MCG/ACT inhaler Inhale 2 puffs into the lungs 2 (two) times daily. 1 Inhaler 6 11/02/2014 at Unknown time  . clonazePAM (KLONOPIN) 0.5 MG tablet Take 0.25-0.5 mg by mouth every 6 (six) hours as needed for anxiety.    11/09/2014 at Unknown time  . clopidogrel (PLAVIX) 75 MG tablet Take 1 tablet (75 mg total) by mouth at bedtime.   11/10/2014 at Unknown time  . diltiazem (CARDIZEM CD) 300 MG 24 hr capsule TAKE 1 CAPSULE IN THE EVENING. 30 capsule 1 11/10/2014 at Unknown time  . furosemide (LASIX) 40 MG tablet Take 1 tablet (40 mg total) by mouth daily. (Patient taking differently: Take 40 mg by mouth every morning. ) 30 tablet 3 10/28/2014 at Unknown time  . guaiFENesin (MUCINEX) 600 MG 12 hr tablet Take 1 tablet by mouth every 12 hours as needed for cough and congestion   11/14/2014 at Unknown time  . ipratropium (ATROVENT)  0.02 % nebulizer solution USE 1 VIAL IN NEBULIZER EVERY 6 HOURS. (Patient taking differently: USE 1 VIAL IN NEBULIZER EVERY 4 HOURS FOR SHORTNESS OF BREATH/WHEEZING) 300 mL 1 UNKNOWN  . isosorbide mononitrate (IMDUR) 30 MG 24 hr tablet TAKE ONE TABLET BY MOUTH AT BEDTIME. (  Patient taking differently: TAKE ONE TABLET BY MOUTH EVERY MORNING) 30 tablet 5 11/06/2014 at Unknown time  . latanoprost (XALATAN) 0.005 % ophthalmic solution Place 1 drop into both eyes at bedtime.   11/21/2014  . levalbuterol (XOPENEX HFA) 45 MCG/ACT inhaler Inhale 2 puffs into the lungs every 4 (four) hours as needed for wheezing. (Patient taking differently: Inhale 2 puffs into the lungs every 4 (four) hours as needed for wheezing or shortness of breath. ) 15 g 6 Past Month at Unknown time  . mirtazapine (REMERON) 15 MG tablet Take 7.5 mg by mouth at bedtime. Take 1/2 tablet by mouth at bedtime   11/10/2014 at Unknown time  . nitroGLYCERIN (NITROSTAT) 0.4 MG SL tablet Place 0.4 mg under the tongue every 5 (five) minutes as needed for chest pain (may repeat x3). Chest pain   11/10/2014 at Unknown time  . nystatin (MYCOSTATIN) 100000 UNIT/ML suspension Take 5 mLs by mouth 4 (four) times daily.    11/04/2014 at Unknown time  . omeprazole (PRILOSEC) 20 MG capsule Take 20 mg by mouth daily as needed (for acid reflux).    unknown  . ondansetron (ZOFRAN) 8 MG tablet TAKE 1/2 TABLET ONCE DAILY AS NEEDED FOR NAUSEA   11/22/2014 at Unknown time  . OXYGEN Inhale 4.5 L into the lungs continuous.    10/28/2014 at Unknown time  . potassium chloride SA (K-DUR,KLOR-CON) 20 MEQ tablet Take 2 tablets by mouth every morning   11/20/2014 at Unknown time  . pravastatin (PRAVACHOL) 80 MG tablet Take 80 mg by mouth at bedtime.   11/10/2014 at Unknown time  . ranitidine (ZANTAC) 150 MG capsule Take 150 mg by mouth daily as needed for heartburn.    unknown at Unknown time  . tamsulosin (FLOMAX) 0.4 MG CAPS capsule TAKE 1 CAPSULE AT BEDTIME FOR INCONTINENCE.    11/10/2014 at Unknown time   Scheduled: . antiseptic oral rinse  7 mL Mouth Rinse BID  . antiseptic oral rinse  7 mL Mouth Rinse QID  . budesonide (PULMICORT) nebulizer solution  0.25 mg Nebulization BID  . chlorhexidine gluconate  15 mL Mouth Rinse BID  . cyanocobalamin  1,000 mcg Intramuscular Daily  . feeding supplement (PRO-STAT SUGAR FREE 64)  30 mL Per Tube Daily  . furosemide  40 mg Intravenous Q12H  . insulin aspart  0-15 Units Subcutaneous 6 times per day  . insulin glargine  7 Units Subcutaneous BID  . latanoprost  1 drop Both Eyes QHS  . levalbuterol  0.63 mg Nebulization Q6H  . methylPREDNISolone (SOLU-MEDROL) injection  30 mg Intravenous Q24H  . pantoprazole (PROTONIX) IV  40 mg Intravenous Daily  . piperacillin-tazobactam (ZOSYN)  IV  3.375 g Intravenous Q8H  . sodium chloride  10-40 mL Intracatheter Q12H  . vancomycin  750 mg Intravenous Q12H   Continuous: . amiodarone 30 mg/hr (11/20/14 2142)  . feeding supplement (VITAL AF 1.2 CAL) 1,000 mL (11/20/14 2000)  . fentaNYL infusion INTRAVENOUS 100 mcg/hr (11/21/14 0500)  . heparin 1,000 Units/hr (11/20/14 1835)  . midazolam (VERSED) infusion 0.5 mg/hr (11/21/14 0500)   GYJ:[id number], fentaNYL (SUBLIMAZE) injection, fentaNYL (SUBLIMAZE) injection, ipratropium, LORazepam, midazolam, midazolam, ondansetron **OR** ondansetron (ZOFRAN) IV, sodium chloride  Assesment: She was admitted with healthcare associated pneumonia. She has severe COPD at baseline and now has acute on chronic respiratory failure requiring ventilator support. She has been unable to wean partially because of her severe COPD and partially because she becomes very agitated and anxious during the weaning process. She  has ischemic cardiomyopathy and chronic combined systolic and diastolic heart failure as well. She remains critically ill with multisystem failure Principal Problem:   HCAP (healthcare-associated pneumonia) Active Problems:   Essential  hypertension   Acute exacerbation of chronic obstructive pulmonary disease (COPD)   Acute on chronic respiratory failure with hypercapnia   Chronic combined systolic and diastolic congestive heart failure   Ischemic cardiomyopathy   Hyperglycemia, drug-induced   Atrial flutter   Obesity   Hypotension   Anemia, iron deficiency   Vitamin B12 deficiency   AAA (abdominal aortic aneurysm) without rupture   Acute on chronic systolic heart failure    Plan: Attempt weaning again today    LOS: 10 days   HAWKINS,EDWARD L 11/21/2014, 8:22 AM

## 2014-11-21 NOTE — Progress Notes (Addendum)
[facility] PROGRESS NOTE  GUSTA MARKSBERRY FWY:[id number] DOB: 1940/07/23 DOA: 11/05/2014 PCP: Sherrie Mustache, MD  Assessment/Plan: Acute on chronic ventilatory dependent hypoxemic and hypercapnic respiratory failure -MF: COPD exacerbation, HCAP, acute CHF. See below for details. -Remains intubated today, will re-attempt weaning today as she is much more alert and responsive. -Failed weaning attempts yesterday mainly because of anxiety causing tachypnea and SOB. Has been started on low dose ativan to help relieve anxiety during the weaning process. -Appreciate Dr. Luan Pulling input and recommendations. -CXR without significant change.  Healthcare associated pneumonia -Continue vancomycin and Zosyn for now. -Consider narrowing once back on oral regimen.  Ischemic cardiomyopathy with acute combined CHF -Ejection fraction of 30-35% with diffuse hypokinesis. -Currently appears volume overloaded. -Lasix increased to 80 mg BID per cardiology to augment diuresis. -UOP 3335 cc overnight, although overall Is and Os remain positive due to multiple drips and fluid resuscitation for sepsis.  Atrial fibrillation with rapid ventricular response -Continue amiodarone and heparin drips today. -Plan to transition to oral amiodarone once extubated and taking by mouth's. -If rate controlling agent is required would use Toprol-XL as per cardiology recommendations.  -Plan to transition to Eliquis for anticoagulation once extubated.  Hypotension -Resolved. -Probably due to diltiazem and possibly sepsis. -Did require pressors, these have now been discontinued.  Acute on chronic COPD -No significant bronchospasm on exam, continue to titrate steroids. -Continue as needed Xopenex.  AAA -Increased aneurysm to 5.1 cm compared to 3.5 cm in 2011. -To see vascular surgery following discharge.  Iron deficiency/ B 12 deficiency anemia -Start ferrous sulfate once taking by mouth's. -Continue daily  vitamin B-12 IV supplementation for a week and then monthly thereafter.   Code Status: Full code Family Communication: husband [name] at bedside updated on plan of care. Disposition Plan: Attempt extubation this afternoon   Consultants:  Cardiology   Pulmonary   Antibiotics:  Vancomycin    Zosyn  Subjective: Intubated, awake. Writes on a piece of paper, "if you can't do someting just let me die".  Objective: Filed Vitals:   11/21/14 0751 11/21/14 0758 11/21/14 0800 11/21/14 0815  BP:   96/80 111/74  Pulse:   86 113  Temp: 97.9 F (36.6 C)     TempSrc: Axillary     Resp:   18 20  Height:      Weight:      SpO2:  97% 95% 94%    Intake/Output Summary (Last 24 hours) at 11/21/14 0928 Last data filed at 11/21/14 0800  Gross per 24 hour  Intake 3268.06 ml  Output   3335 ml  Net -66.94 ml   Filed Weights   11/19/14 0500 11/20/14 0500 11/21/14 0500  Weight: 82 kg (180 lb 12.4 oz) 85 kg (187 lb 6.3 oz) 84.6 kg (186 lb 8.2 oz)    Exam:   General:  Awake  Cardiovascular: irregular  Respiratory: CTA B  Abdomen: S/NT/ND/+BS  Extremities: 2+ edema   Neurologic:  Moves all 4  Data Reviewed: Basic Metabolic Panel:  Recent Labs Lab 11/17/14 0440 11/18/14 0415 11/19/14 0451 11/20/14 0523 11/20/14 2136 11/21/14 0505  NA 142 140 143 143  --  143  K 3.2* 3.3* 2.2* 2.8* 3.3* 3.6  CL 103 100* 95* 98*  --  94*  CO2 33* 33* 37* 38*  --  40*  GLUCOSE 115* 173* 161* 195*  --  164*  BUN 17 27* 25* 22*  --  21*  CREATININE 0.50 0.64 0.47 0.47  --  0.48  CALCIUM 7.6* 7.5* 7.0* 7.2*  --  7.6*  MG  --   --   --  1.8  --   --    Liver Function Tests:  Recent Labs Lab 11/15/14 0415  AST 64*  ALT 40  ALKPHOS 55  BILITOT 0.8  PROT 5.5*  ALBUMIN 2.8*   No results for input(s): LIPASE, AMYLASE in the last 168 hours. No results for input(s): AMMONIA in the last 168 hours. CBC:  Recent Labs Lab 11/17/14 0440 11/18/14 0415 11/19/14 0451 11/20/14 0523  11/21/14 0505  WBC 5.7 13.1* 13.4* 14.1* 22.2*  HGB 9.6* 9.6* 9.2* 9.0* 9.5*  HCT 31.9* 32.2* 30.9* 30.3* 32.5*  MCV 90.1 90.4 90.9 91.5 92.1  PLT 242 241 216 207 241   Cardiac Enzymes:  Recent Labs Lab 11/14/14 1425 11/14/14 2000 11/20/14 2136 11/21/14 0320 11/21/14 0826  TROPONINI 0.05* 0.07* <0.03 <0.03 <0.03   BNP (last 3 results)  Recent Labs  11/10/2014 1903 11/16/14 0415 11/21/14 0505  BNP 549.0* 1064.0* 571.0*    ProBNP (last 3 results)  Recent Labs  12/29/13 1238 04/15/14 1417 10/18/14 1456  PROBNP 195.0* 230.0* 288.0*    CBG:  Recent Labs Lab 11/20/14 1624 11/20/14 1930 11/20/14 2358 11/21/14 0412 11/21/14 0710  GLUCAP 115* 122* 108* 145* 124*    Recent Results (from the past 240 hour(s))  MRSA PCR Screening     Status: None   Collection Time: 11/16/2014 11:59 PM  Result Value Ref Range Status   MRSA by PCR NEGATIVE NEGATIVE Final    Comment:        The GeneXpert MRSA Assay (FDA approved for NASAL specimens only), is one component of a comprehensive MRSA colonization surveillance program. It is not intended to diagnose MRSA infection nor to guide or monitor treatment for MRSA infections.   C difficile quick scan w PCR reflex     Status: None   Collection Time: 11/19/14 12:02 AM  Result Value Ref Range Status   C Diff antigen NEGATIVE NEGATIVE Final   C Diff toxin NEGATIVE NEGATIVE Final   C Diff interpretation Negative for toxigenic C. difficile  Final     Studies: Dg Chest 1 View  11/21/2014   CLINICAL DATA:  Shortness of breath, respiratory failure, ventilatory support  EXAM: CHEST 1 VIEW  COMPARISON:  04/21/2014  FINDINGS: Endotracheal tube 4.5 cm above the carina. NG tube enters the stomach with the tip not visualized. Monitor leads overlie the chest. Exam is rotated to the left. Heart remains enlarged with small to moderate pleural effusions, larger on the left. Left greater than right basilar collapse/consolidation persist. No  pneumothorax. Stable right PICC line position. Atherosclerosis noted of the aorta.  IMPRESSION: No significant change in left greater than right basilar collapse/ consolidation and associated pleural effusions.   Electronically Signed   By: Jerilynn Mages.  Shick M.D.   On: 11/21/2014 08:04   Dg Chest Port 1 View  11/20/2014   CLINICAL DATA:  Patient with shortness of breath. History of congestive heart failure.  EXAM: PORTABLE CHEST 1 VIEW  COMPARISON:  Chest radiograph 11/18/2014  FINDINGS: Enteric tube courses to the inferior aspect of the radiograph, tip not included on current examination. Side-port appears at the GE junction. ET tube terminates in the mid trachea. Stable enlarged cardiac and mediastinal contours with moderate left and small right pleural effusions. Underlying left-greater-than-right pulmonary consolidation. No definite pneumothorax. Right upper extremity PICC line is present, unchanged from prior.  IMPRESSION: Interval advancement  of enteric tube. The side port may be at the GE junction, consider advancement and correlation with abdominal radiography.  ET tube terminates in the mid trachea.  Persistent moderate left and small right pleural effusions with underlying pulmonary consolidation likely representing edema and atelectasis. Infection not excluded.   Electronically Signed   By: Lovey Newcomer M.D.   On: 11/20/2014 10:20    Scheduled Meds: . antiseptic oral rinse  7 mL Mouth Rinse BID  . antiseptic oral rinse  7 mL Mouth Rinse QID  . budesonide (PULMICORT) nebulizer solution  0.25 mg Nebulization BID  . chlorhexidine gluconate  15 mL Mouth Rinse BID  . cyanocobalamin  1,000 mcg Intramuscular Daily  . feeding supplement (PRO-STAT SUGAR FREE 64)  30 mL Per Tube Daily  . furosemide  40 mg Intravenous Once  . furosemide  80 mg Intravenous Q12H  . insulin aspart  0-15 Units Subcutaneous 6 times per day  . insulin glargine  7 Units Subcutaneous BID  . latanoprost  1 drop Both Eyes QHS  .  levalbuterol  0.63 mg Nebulization Q6H  . methylPREDNISolone (SOLU-MEDROL) injection  30 mg Intravenous Q24H  . pantoprazole (PROTONIX) IV  40 mg Intravenous Daily  . piperacillin-tazobactam (ZOSYN)  IV  3.375 g Intravenous Q8H  . sodium chloride  10-40 mL Intracatheter Q12H  . vancomycin  750 mg Intravenous Q12H   Continuous Infusions: . amiodarone 30 mg/hr (11/20/14 2142)  . feeding supplement (VITAL AF 1.2 CAL) 1,000 mL (11/21/14 0800)  . fentaNYL infusion INTRAVENOUS 100 mcg/hr (11/21/14 0800)  . heparin 1,000 Units/hr (11/21/14 0800)  . midazolam (VERSED) infusion 0.5 mg/hr (11/21/14 0800)    Principal Problem:   HCAP (healthcare-associated pneumonia) Active Problems:   Essential hypertension   Acute exacerbation of chronic obstructive pulmonary disease (COPD)   Acute on chronic respiratory failure with hypercapnia   Chronic combined systolic and diastolic congestive heart failure   Ischemic cardiomyopathy   Hyperglycemia, drug-induced   Atrial flutter   Obesity   Hypotension   Anemia, iron deficiency   Vitamin B12 deficiency   AAA (abdominal aortic aneurysm) without rupture   Acute on chronic systolic heart failure    Critical Care time: 35 minutes. Greater than 50% of this time was spent in direct contact with the patient coordinating care.    Lelon Frohlich  [facility] Pager [phone]  If 7PM-7AM, please contact night-coverage at [email or web address], password [facility] 11/21/2014, 9:28 AM  LOS: 10 days

## 2014-11-21 NOTE — Progress Notes (Addendum)
Wasted 50 mL of versed 50 mg in 50 mL and 90 mL of fentanyl 2500 mcg in 250 mL with Burna Sis, RN. Witnessed [employer] of versed and 90cc of fentanyl in sink.

## 2014-11-21 NOTE — Progress Notes (Signed)
Pt and husband expressed wishes for DO NOT INTUBATE today to day shift nurse. MD did not address code status. Paged Night shift MD who is now aware and states that he will talk to family and patient if situation should arise. Pt currently on BiPAP O2 sats in mid to low 90s RR 16. HR 84 BP 103/57

## 2014-11-21 NOTE — Progress Notes (Signed)
Called by RN that rations has been in family wants to readdress the CODE STATUS. Discussed with patient who is currently on BiPAP, regarding the CODE STATUS, CPR, intubation and mechanical ventilation. Patient does not want any aggressive measures and wishes to be DO NOT RESUSCITATE. This was confirmed with patient's husband who agrees with patient's decision of DO NOT RESUSCITATE. Will change the CODE STATUS to DO NOT RESUSCITATE

## 2014-11-21 NOTE — Progress Notes (Signed)
Primary cardiologist:  Subjective:    No events overnight  Objective:   Temp:  [96 F (35.6 C)-97.9 F (36.6 C)] 97.9 F (36.6 C) (09/26 0751) Pulse Rate:  [35-113] 113 (09/26 0815) Resp:  [9-33] 20 (09/26 0815) BP: (59-137)/(8-104) 111/74 mmHg (09/26 0815) SpO2:  [80 %-100 %] 94 % (09/26 0815) FiO2 (%):  [40 %] 40 % (09/26 0800) Weight:  [186 lb 8.2 oz (84.6 kg)] 186 lb 8.2 oz (84.6 kg) (09/26 0500) Last BM Date: 11/20/14  Filed Weights   11/19/14 0500 11/20/14 0500 11/21/14 0500  Weight: 180 lb 12.4 oz (82 kg) 187 lb 6.3 oz (85 kg) 186 lb 8.2 oz (84.6 kg)    Intake/Output Summary (Last 24 hours) at 11/21/14 0830 Last data filed at 11/21/14 0600  Gross per 24 hour  Intake 3153.66 ml  Output   3335 ml  Net -181.34 ml    Telemetry: flutter, rate controlled  Exam:  General: NAD  Resp: coarse bilaterally  Cardiac: irreg, no m/r/g  GI: abdomen soft, NT, ND  MSK:no LE edema  Neuro: awake, alert. No focal deficits   Lab Results:  Basic Metabolic Panel:  Recent Labs Lab 11/19/14 0451 11/20/14 0523 11/20/14 2136 11/21/14 0505  NA 143 143  --  143  K 2.2* 2.8* 3.3* 3.6  CL 95* 98*  --  94*  CO2 37* 38*  --  40*  GLUCOSE 161* 195*  --  164*  BUN 25* 22*  --  21*  CREATININE 0.47 0.47  --  0.48  CALCIUM 7.0* 7.2*  --  7.6*  MG  --  1.8  --   --     Liver Function Tests:  Recent Labs Lab 11/15/14 0415  AST 64*  ALT 40  ALKPHOS 55  BILITOT 0.8  PROT 5.5*  ALBUMIN 2.8*    CBC:  Recent Labs Lab 11/19/14 0451 11/20/14 0523 11/21/14 0505  WBC 13.4* 14.1* 22.2*  HGB 9.2* 9.0* 9.5*  HCT 30.9* 30.3* 32.5*  MCV 90.9 91.5 92.1  PLT 216 207 241    Cardiac Enzymes:  Recent Labs Lab 11/14/14 2000 11/20/14 2136 11/21/14 0320  TROPONINI 0.07* <0.03 <0.03    BNP:  Recent Labs  12/29/13 1238 04/15/14 1417 10/18/14 1456  PROBNP 195.0* 230.0* 288.0*    Coagulation: No results for input(s): INR in the last 168  hours.  ECG:   Medications:   Scheduled Medications: . antiseptic oral rinse  7 mL Mouth Rinse BID  . antiseptic oral rinse  7 mL Mouth Rinse QID  . budesonide (PULMICORT) nebulizer solution  0.25 mg Nebulization BID  . chlorhexidine gluconate  15 mL Mouth Rinse BID  . cyanocobalamin  1,000 mcg Intramuscular Daily  . feeding supplement (PRO-STAT SUGAR FREE 64)  30 mL Per Tube Daily  . furosemide  40 mg Intravenous Q12H  . insulin aspart  0-15 Units Subcutaneous 6 times per day  . insulin glargine  7 Units Subcutaneous BID  . latanoprost  1 drop Both Eyes QHS  . levalbuterol  0.63 mg Nebulization Q6H  . methylPREDNISolone (SOLU-MEDROL) injection  30 mg Intravenous Q24H  . pantoprazole (PROTONIX) IV  40 mg Intravenous Daily  . piperacillin-tazobactam (ZOSYN)  IV  3.375 g Intravenous Q8H  . sodium chloride  10-40 mL Intracatheter Q12H  . vancomycin  750 mg Intravenous Q12H     Infusions: . amiodarone 30 mg/hr (11/20/14 2142)  . feeding supplement (VITAL AF 1.2 CAL) 1,000 mL (11/21/14 0800)  .  fentaNYL infusion INTRAVENOUS 100 mcg/hr (11/21/14 0800)  . heparin 1,000 Units/hr (11/21/14 0800)  . midazolam (VERSED) infusion 0.5 mg/hr (11/21/14 0500)     PRN Medications:  acetaminophen, fentaNYL (SUBLIMAZE) injection, fentaNYL (SUBLIMAZE) injection, ipratropium, LORazepam, midazolam, midazolam, ondansetron **OR** ondansetron (ZOFRAN) IV, sodium chloride     Assessment/Plan    1. Acute on chronic systolic HF - echo 03/9526 LVFE 30-35%, cannot eval diastolic function - CHF likely promoted by tachycardia and fluids in the setting of her sepsis and hypotension. Weight up during admit 165-->182 lbs.   I/Os even from yesterday, remains +5.9 liters since admission. Cr and BUN remain stable, she is on lasix 40mg  IV bid. Weight yesterday reportedly 186 lbs. If accurate her CVP is very elevated high 20s.  - needs more aggressive diuresis, will increase lasix to 80mg  IV bid.  -  consider low dose beta blocker once COPD exacerbation resolved.  Soft bp's at times, will not start ACE-I.    2. COPD - per primary team   3. Pneumonia - per primary team  4. Aflutter - continue amio and hep gtt - remains in flutter but rates are controlled. Once more stable from respiratory standpoint could consider cardioversion, however no urgency given her rates are well controlled.  - if extubated today will change to oral amio, stop heparin and start eliquis 5mg  bid.         Carlyle Dolly, M.D.

## 2014-11-21 NOTE — Progress Notes (Signed)
ANTICOAGULATION Follow Up NOTE  Pharmacy Consult for Heparin Indication: atrial fibrillation  Allergies  Allergen Reactions  . Albuterol Other (See Comments)    Jittery/shakiness  . Levaquin [Levofloxacin In D5w] Nausea Only  . Sulfa Antibiotics Nausea Only   Patient Measurements: Height: 5\' 3"  (160 cm) Weight: 186 lb 8.2 oz (84.6 kg) IBW/kg (Calculated) : 52.4 HEPARIN DW (KG): 68.3  Vital Signs: Temp: 97.9 F (36.6 C) (09/26 0751) Temp Source: Axillary (09/26 0751) BP: 106/64 mmHg (09/26 0745) Pulse Rate: 89 (09/26 0745)  Labs:  Recent Labs  11/19/14 0451 11/19/14 2725 11/20/14 0522 11/20/14 0523 11/20/14 2136 11/21/14 0320 11/21/14 0505  HGB 9.2*  --   --  9.0*  --   --  9.5*  HCT 30.9*  --   --  30.3*  --   --  32.5*  PLT 216  --   --  207  --   --  241  HEPARINUNFRC 1.19* 0.67 0.41  --   --   --  0.42  CREATININE 0.47  --   --  0.47  --   --  0.48  TROPONINI  --   --   --   --  <0.03 <0.03  --    Estimated Creatinine Clearance: 64.6 mL/min (by C-G formula based on Cr of 0.48).  Medications:  Scheduled:  . antiseptic oral rinse  7 mL Mouth Rinse BID  . antiseptic oral rinse  7 mL Mouth Rinse QID  . budesonide (PULMICORT) nebulizer solution  0.25 mg Nebulization BID  . chlorhexidine gluconate  15 mL Mouth Rinse BID  . cyanocobalamin  1,000 mcg Intramuscular Daily  . feeding supplement (PRO-STAT SUGAR FREE 64)  30 mL Per Tube Daily  . furosemide  40 mg Intravenous Q12H  . insulin aspart  0-15 Units Subcutaneous 6 times per day  . insulin glargine  7 Units Subcutaneous BID  . latanoprost  1 drop Both Eyes QHS  . levalbuterol  0.63 mg Nebulization Q6H  . methylPREDNISolone (SOLU-MEDROL) injection  30 mg Intravenous Q24H  . pantoprazole (PROTONIX) IV  40 mg Intravenous Daily  . piperacillin-tazobactam (ZOSYN)  IV  3.375 g Intravenous Q8H  . sodium chloride  10-40 mL Intracatheter Q12H  . vancomycin  750 mg Intravenous Q12H   Assessment: Okay for  Protocol, PT/INR WNL on 9/16.  H/H stable.  HL is therapeutic   Goal of Therapy:  Heparin level 0.3-0.7 units/ml Monitor platelets by anticoagulation protocol: Yes   Plan:  Continue Heparin drip @ 1000 units/hr Daily Hep Level and CBC while on Heparin  Isac Sarna, BS Vena [place], BCPS Clinical Pharmacist Pager [phone]  11/21/2014,8:02 AM

## 2014-11-22 ENCOUNTER — Inpatient Hospital Stay (HOSPITAL_COMMUNITY): Payer: Medicare Other

## 2014-11-22 LAB — BASIC METABOLIC PANEL
ANION GAP: 9 (ref 5–15)
BUN: 17 mg/dL (ref 6–20)
CALCIUM: 7.7 mg/dL — AB (ref 8.9–10.3)
CO2: 44 mmol/L — ABNORMAL HIGH (ref 22–32)
Chloride: 89 mmol/L — ABNORMAL LOW (ref 101–111)
Creatinine, Ser: 0.51 mg/dL (ref 0.44–1.00)
GFR calc Af Amer: >60 mL/min (ref >60)
Glucose, Bld: 137 mg/dL — ABNORMAL HIGH (ref 65–99)
POTASSIUM: 3.2 mmol/L — AB (ref 3.5–5.1)
SODIUM: 142 mmol/L (ref 135–145)

## 2014-11-22 LAB — GLUCOSE, CAPILLARY
GLUCOSE-CAPILLARY: 121 mg/dL — AB (ref 65–99)
GLUCOSE-CAPILLARY: 153 mg/dL — AB (ref 65–99)
Glucose-Capillary: 157 mg/dL — ABNORMAL HIGH (ref 65–99)
Glucose-Capillary: 124 mg/dL — ABNORMAL HIGH (ref 65–99)

## 2014-11-22 LAB — CBC
HCT: 31.7 % — ABNORMAL LOW (ref 36.0–46.0)
Hemoglobin: 9.3 g/dL — ABNORMAL LOW (ref 12.0–15.0)
MCH: 26.9 pg (ref 26.0–34.0)
MCHC: 29.3 g/dL — ABNORMAL LOW (ref 30.0–36.0)
MCV: 91.6 fL (ref 78.0–100.0)
PLATELETS: 223 10*3/uL (ref 150–400)
RBC: 3.46 MIL/uL — AB (ref 3.87–5.11)
RDW: 16.5 % — AB (ref 11.5–15.5)
WBC: 21.4 10*3/uL — AB (ref 4.0–10.5)

## 2014-11-22 LAB — HEPARIN LEVEL (UNFRACTIONATED): Heparin Unfractionated: 0.42 IU/mL (ref 0.30–0.70)

## 2014-11-22 LAB — VANCOMYCIN, TROUGH: VANCOMYCIN TR: 22 ug/mL — AB (ref 10.0–20.0)

## 2014-11-22 MED ORDER — SODIUM CHLORIDE 0.9 % IV SOLN
1.0000 mg/h | INTRAVENOUS | Status: DC
  Administered 2014-11-22: 1 mg/h via INTRAVENOUS
  Administered 2014-11-24: 0.5 mg/h via INTRAVENOUS
  Filled 2014-11-22 (×2): qty 10

## 2014-11-22 MED ORDER — LORAZEPAM 2 MG/ML IJ SOLN
1.0000 mg | INTRAMUSCULAR | Status: DC | PRN
  Administered 2014-11-23: 1 mg via INTRAVENOUS
  Filled 2014-11-22: qty 1

## 2014-11-22 MED ORDER — FUROSEMIDE 10 MG/ML IJ SOLN
80.0000 mg | Freq: Two times a day (BID) | INTRAMUSCULAR | Status: DC
  Administered 2014-11-22: 80 mg via INTRAVENOUS
  Filled 2014-11-22 (×2): qty 8

## 2014-11-22 MED ORDER — METHYLPREDNISOLONE SODIUM SUCC 40 MG IJ SOLR
30.0000 mg | Freq: Four times a day (QID) | INTRAMUSCULAR | Status: DC
  Administered 2014-11-22: 30 mg via INTRAVENOUS
  Filled 2014-11-22: qty 1

## 2014-11-22 NOTE — Progress Notes (Signed)
[facility] PROGRESS NOTE  [name] [name] KPT:[id number] DOB: 06-Sep-1940 DOA: 11/17/2014 PCP: Sherrie Mustache, MD  Assessment/Plan: Acute on chronic ventilatory dependent hypoxemic and hypercapnic respiratory failure -MF: COPD exacerbation, HCAP, acute CHF. See below for details. -Was able to extubate yesterday afternoon; however became very SOB and tachypneic and was placed back on NIPPV. Is currently on [place] oxygen with significant WOB. -Husband does not seem quite ready for withdrawal of care yet, will ask palliative care to see to further delineate future GOC. -Is now a DNR.  Healthcare associated pneumonia -Continue vancomycin and Zosyn for now. -Consider narrowing once back on oral regimen.  Ischemic cardiomyopathy with acute combined CHF -Ejection fraction of 30-35% with diffuse hypokinesis. -Currently appears volume overloaded. -Lasix increased to 80 mg BID per cardiology to augment diuresis. - Negative 4.1 lt yesterday, altho still 1.5 L positive since admission.  Atrial fibrillation with rapid ventricular response -Continue amiodarone and heparin drips today. -Plan to transition to eliquis and PO amiodarone once reliably taking POs if plan continues to be for aggressive care.  Hypotension -Resolved. -Probably due to diltiazem and possibly sepsis. -Did require pressors, these have now been discontinued.  Acute on chronic COPD -No significant bronchospasm on exam, continue to titrate steroids. -Continue as needed Xopenex.  AAA -Increased aneurysm to 5.1 cm compared to 3.5 cm in 2011. -To see vascular surgery following discharge.  Iron deficiency/ B 12 deficiency anemia -Start ferrous sulfate once taking by mouth's. -Continue daily vitamin B-12 IV supplementation for a week and then monthly thereafter.   Code Status: DNR Family Communication:Will discuss with husband [name] later today. Disposition Plan: To be determined.   Consultants:  Cardiology     Pulmonary   Antibiotics:  Vancomycin    Zosyn  Subjective: Extubated, on [place] oxygen, with significant WOB.  Objective: Filed Vitals:   11/22/14 0600 11/22/14 0800 11/22/14 0826 11/22/14 0830  BP: 109/91  99/62   Pulse: 111  95   Temp:    97.8 F (36.6 C)  TempSrc:    Axillary  Resp: 17     Height:      Weight:  78.2 kg (172 lb 6.4 oz)    SpO2: 96%  95%     Intake/Output Summary (Last 24 hours) at 11/22/14 1036 Last data filed at 11/22/14 0600  Gross per 24 hour  Intake 987.41 ml  Output   4150 ml  Net -3162.59 ml   Filed Weights   11/20/14 0500 11/21/14 0500 11/22/14 0800  Weight: 85 kg (187 lb 6.3 oz) 84.6 kg (186 lb 8.2 oz) 78.2 kg (172 lb 6.4 oz)    Exam:   General:  Awake  Cardiovascular: irregular  Respiratory: CTA B  Abdomen: S/NT/ND/+BS  Extremities: 2+ edema   Neurologic:  Moves all 4  Data Reviewed: Basic Metabolic Panel:  Recent Labs Lab 11/18/14 0415 11/19/14 0451 11/20/14 0523 11/20/14 2136 11/21/14 0505 11/22/14 0415  NA 140 143 143  --  143 142  K 3.3* 2.2* 2.8* 3.3* 3.6 3.2*  CL 100* 95* 98*  --  94* 89*  CO2 33* 37* 38*  --  40* 44*  GLUCOSE 173* 161* 195*  --  164* 137*  BUN 27* 25* 22*  --  21* 17  CREATININE 0.64 0.47 0.47  --  0.48 0.51  CALCIUM 7.5* 7.0* 7.2*  --  7.6* 7.7*  MG  --   --  1.8  --   --   --  Liver Function Tests: No results for input(s): AST, ALT, ALKPHOS, BILITOT, PROT, ALBUMIN in the last 168 hours. No results for input(s): LIPASE, AMYLASE in the last 168 hours. No results for input(s): AMMONIA in the last 168 hours. CBC:  Recent Labs Lab 11/18/14 0415 11/19/14 0451 11/20/14 0523 11/21/14 0505 11/22/14 0415  WBC 13.1* 13.4* 14.1* 22.2* 21.4*  HGB 9.6* 9.2* 9.0* 9.5* 9.3*  HCT 32.2* 30.9* 30.3* 32.5* 31.7*  MCV 90.4 90.9 91.5 92.1 91.6  PLT 241 216 207 241 223   Cardiac Enzymes:  Recent Labs Lab 11/20/14 2136 11/21/14 0320 11/21/14 0826  TROPONINI <0.03 <0.03 <0.03   BNP  (last 3 results)  Recent Labs  11/23/2014 1903 11/16/14 0415 11/21/14 0505  BNP 549.0* 1064.0* 571.0*    ProBNP (last 3 results)  Recent Labs  12/29/13 1238 04/15/14 1417 10/18/14 1456  PROBNP 195.0* 230.0* 288.0*    CBG:  Recent Labs Lab 11/21/14 1600 11/21/14 1954 11/22/14 0007 11/22/14 0356 11/22/14 0732  GLUCAP 96 98 153* 157* 121*    Recent Results (from the past 240 hour(s))  C difficile quick scan w PCR reflex     Status: None   Collection Time: 11/19/14 12:02 AM  Result Value Ref Range Status   C Diff antigen NEGATIVE NEGATIVE Final   C Diff toxin NEGATIVE NEGATIVE Final   C Diff interpretation Negative for toxigenic C. difficile  Final     Studies: Dg Chest 1 View  11/21/2014   CLINICAL DATA:  Shortness of breath, respiratory failure, ventilatory support  EXAM: CHEST 1 VIEW  COMPARISON:  04/21/2014  FINDINGS: Endotracheal tube 4.5 cm above the carina. NG tube enters the stomach with the tip not visualized. Monitor leads overlie the chest. Exam is rotated to the left. Heart remains enlarged with small to moderate pleural effusions, larger on the left. Left greater than right basilar collapse/consolidation persist. No pneumothorax. Stable right PICC line position. Atherosclerosis noted of the aorta.  IMPRESSION: No significant change in left greater than right basilar collapse/ consolidation and associated pleural effusions.   Electronically Signed   By: Jerilynn Mages.  Shick M.D.   On: 11/21/2014 08:04   Dg Chest Port 1 View  11/22/2014   CLINICAL DATA:  Pneumonia.  Respiratory distress.  EXAM: PORTABLE CHEST 1 VIEW  COMPARISON:  11/21/2014  FINDINGS: Endotracheal and enteric tubes have been removed. Right PICC remains in place, grossly unchanged. Cardiac silhouette remains enlarged. Thoracic aortic calcification is noted. The patient remains mildly rotated to the left, less than on the prior study. Small right and moderate left pleural effusions do not appear significantly  changed. Left greater than right basilar parenchymal lung opacities are unchanged, likely with a component of left lower lobe collapse. No pneumothorax is seen.  IMPRESSION: Unchanged, left greater than right pleural effusions and atelectasis/consolidation.   Electronically Signed   By: Logan Bores M.D.   On: 11/22/2014 07:45    Scheduled Meds: . antiseptic oral rinse  7 mL Mouth Rinse BID  . antiseptic oral rinse  7 mL Mouth Rinse QID  . budesonide (PULMICORT) nebulizer solution  0.25 mg Nebulization BID  . chlorhexidine gluconate  15 mL Mouth Rinse BID  . feeding supplement (PRO-STAT SUGAR FREE 64)  30 mL Per Tube Daily  . furosemide  80 mg Intravenous BID  . insulin aspart  0-15 Units Subcutaneous 6 times per day  . insulin glargine  7 Units Subcutaneous BID  . latanoprost  1 drop Both Eyes QHS  .  levalbuterol  0.63 mg Nebulization Q6H  . methylPREDNISolone (SOLU-MEDROL) injection  30 mg Intravenous Q6H  . pantoprazole (PROTONIX) IV  40 mg Intravenous Daily  . piperacillin-tazobactam (ZOSYN)  IV  3.375 g Intravenous Q8H  . sodium chloride  10-40 mL Intracatheter Q12H  . vancomycin  750 mg Intravenous Q12H   Continuous Infusions: . amiodarone 30 mg/hr (11/21/14 1517)  . feeding supplement (VITAL AF 1.2 CAL) Stopped (11/21/14 1000)  . fentaNYL infusion INTRAVENOUS Stopped (11/21/14 1000)  . heparin 1,000 Units/hr (11/21/14 2031)  . midazolam (VERSED) infusion Stopped (11/21/14 1000)    Principal Problem:   HCAP (healthcare-associated pneumonia) Active Problems:   Essential hypertension   Acute exacerbation of chronic obstructive pulmonary disease (COPD)   Acute on chronic respiratory failure with hypercapnia   Chronic combined systolic and diastolic congestive heart failure   Ischemic cardiomyopathy   Hyperglycemia, drug-induced   Atrial flutter   Obesity   Hypotension   Anemia, iron deficiency   Vitamin B12 deficiency   AAA (abdominal aortic aneurysm) without rupture    Acute on chronic systolic heart failure   AAA (abdominal aortic aneurysm)    Time Spent: 25 minutes. Greater than 50% of this time was spent in direct contact with the patient coordinating care.    Lelon Frohlich  [facility] Pager [phone]  If 7PM-7AM, please contact night-coverage at [email or web address], password [facility] 11/22/2014, 10:36 AM  LOS: 11 days

## 2014-11-22 NOTE — Progress Notes (Addendum)
Subjective: She was extubated yesterday but has had more trouble. She is back on BiPAP now. I discussed the situation with the patient and her husband and she does not want to be placed back on the ventilator.  Objective: Vital signs in last 24 hours: Temp:  [97.5 F (36.4 C)-97.8 F (36.6 C)] 97.5 F (36.4 C) (09/27 0400) Pulse Rate:  [58-114] 95 (09/27 0826) Resp:  [14-22] 17 (09/27 0600) BP: (90-130)/(43-103) 99/62 mmHg (09/27 0826) SpO2:  [72 %-100 %] 95 % (09/27 0826) FiO2 (%):  [35 %-45 %] 45 % (09/27 0421) Weight change:  Last BM Date: 11/21/14  Intake/Output from previous day: 09/26 0701 - 09/27 0700 In: 1312.8 [I.V.:682.8; NG/GT:180; IV Piggyback:450] Out: 5450 [Urine:5450]  PHYSICAL EXAM General appearance: alert and moderate distress Resp: rhonchi bilaterally Cardio: irregularly irregular rhythm GI: soft, non-tender; bowel sounds normal; no masses,  no organomegaly Extremities: extremities normal, atraumatic, no cyanosis or edema  Lab Results:  Results for orders placed or performed during the hospital encounter of 11/23/2014 (from the past 48 hour(s))  Glucose, capillary     Status: Abnormal   Collection Time: 11/20/14 11:59 AM  Result Value Ref Range   Glucose-Capillary 160 (H) 65 - 99 mg/dL  Blood gas, arterial     Status: Abnormal   Collection Time: 11/20/14  2:30 PM  Result Value Ref Range   FIO2 40.00    Delivery systems VENTILATOR    Mode CONTINUOUS POSITIVE AIRWAY PRESSURE    Pressure support 5.0 cm H20   pH, Arterial 7.302 (L) 7.350 - 7.450   pCO2 arterial 82.8 (HH) 35.0 - 45.0 mmHg    Comment: CRITICAL RESULT CALLED TO, READ BACK BY AND VERIFIED WITH: LEIGH ANN SCHONEWITZ,RN AT 1436 BY VANESSA LAWSON RRT,RCP 11/20/14    pO2, Arterial 56.3 (L) 80.0 - 100.0 mmHg   Bicarbonate 34.6 (H) 20.0 - 24.0 mEq/L   TCO2 11.5 0 - 100 mmol/L   Acid-Base Excess 13.0 (H) 0.0 - 2.0 mmol/L   O2 Saturation 81.3 %   Patient temperature 37.0    Collection site LEFT  RADIAL    Drawn by [id number]    Sample type ARTERIAL    Allens test (pass/fail) PASS PASS  Glucose, capillary     Status: Abnormal   Collection Time: 11/20/14  4:24 PM  Result Value Ref Range   Glucose-Capillary 115 (H) 65 - 99 mg/dL   Comment 1 Notify RN    Comment 2 Document in Chart   Glucose, capillary     Status: Abnormal   Collection Time: 11/20/14  7:30 PM  Result Value Ref Range   Glucose-Capillary 122 (H) 65 - 99 mg/dL  Troponin I (q 6hr x 3)     Status: None   Collection Time: 11/20/14  9:36 PM  Result Value Ref Range   Troponin I <0.03 <0.031 ng/mL    Comment:        NO INDICATION OF MYOCARDIAL INJURY.   Potassium     Status: Abnormal   Collection Time: 11/20/14  9:36 PM  Result Value Ref Range   Potassium 3.3 (L) 3.5 - 5.1 mmol/L  Glucose, capillary     Status: Abnormal   Collection Time: 11/20/14 11:58 PM  Result Value Ref Range   Glucose-Capillary 108 (H) 65 - 99 mg/dL  Troponin I (q 6hr x 3)     Status: None   Collection Time: 11/21/14  3:20 AM  Result Value Ref Range   Troponin I <0.03 <0.031 ng/mL  Comment:        NO INDICATION OF MYOCARDIAL INJURY.   Glucose, capillary     Status: Abnormal   Collection Time: 11/21/14  4:12 AM  Result Value Ref Range   Glucose-Capillary 145 (H) 65 - 99 mg/dL  Heparin level (unfractionated)     Status: None   Collection Time: 11/21/14  5:05 AM  Result Value Ref Range   Heparin Unfractionated 0.42 0.30 - 0.70 IU/mL    Comment:        IF HEPARIN RESULTS ARE BELOW EXPECTED VALUES, AND PATIENT DOSAGE HAS BEEN CONFIRMED, SUGGEST FOLLOW UP TESTING OF ANTITHROMBIN III LEVELS.   CBC     Status: Abnormal   Collection Time: 11/21/14  5:05 AM  Result Value Ref Range   WBC 22.2 (H) 4.0 - 10.5 K/uL   RBC 3.53 (L) 3.87 - 5.11 MIL/uL   Hemoglobin 9.5 (L) 12.0 - 15.0 g/dL   HCT 32.5 (L) 36.0 - 46.0 %   MCV 92.1 78.0 - 100.0 fL   MCH 26.9 26.0 - 34.0 pg   MCHC 29.2 (L) 30.0 - 36.0 g/dL   RDW 16.6 (H) 11.5 - 15.5 %    Platelets 241 150 - 400 K/uL  Brain natriuretic peptide     Status: Abnormal   Collection Time: 11/21/14  5:05 AM  Result Value Ref Range   B Natriuretic Peptide 571.0 (H) 0.0 - 100.0 pg/mL  Basic metabolic panel     Status: Abnormal   Collection Time: 11/21/14  5:05 AM  Result Value Ref Range   Sodium 143 135 - 145 mmol/L   Potassium 3.6 3.5 - 5.1 mmol/L   Chloride 94 (L) 101 - 111 mmol/L   CO2 40 (H) 22 - 32 mmol/L   Glucose, Bld 164 (H) 65 - 99 mg/dL   BUN 21 (H) 6 - 20 mg/dL   Creatinine, Ser 0.48 0.44 - 1.00 mg/dL   Calcium 7.6 (L) 8.9 - 10.3 mg/dL   GFR calc non Af Amer >60 >60 mL/min   GFR calc Af Amer >60 >60 mL/min    Comment: (NOTE) The eGFR has been calculated using the CKD EPI equation. This calculation has not been validated in all clinical situations. eGFR's persistently <60 mL/min signify possible Chronic Kidney Disease.    Anion gap 9 5 - 15  Glucose, capillary     Status: Abnormal   Collection Time: 11/21/14  7:10 AM  Result Value Ref Range   Glucose-Capillary 124 (H) 65 - 99 mg/dL   Comment 1 Notify RN    Comment 2 Document in Chart   Troponin I (q 6hr x 3)     Status: None   Collection Time: 11/21/14  8:26 AM  Result Value Ref Range   Troponin I <0.03 <0.031 ng/mL    Comment:        NO INDICATION OF MYOCARDIAL INJURY.   Blood gas, arterial     Status: Abnormal   Collection Time: 11/21/14  9:14 AM  Result Value Ref Range   FIO2 40.00    Delivery systems VENTILATOR    Mode CONTINUOUS POSITIVE AIRWAY PRESSURE    Peep/cpap 5.0 cm H20   Pressure support 5 cm H20   pH, Arterial 7.390 7.350 - 7.450   pCO2 arterial 67.5 (HH) 35.0 - 45.0 mmHg    Comment: CRITICAL RESULT CALLED TO, READ BACK BY AND VERIFIED WITH: MISY MCDANIELRN BY ROBIN POWELLRRT ON 11/20/14 AT 0925    pO2, Arterial 60.2 (L) 80.0 -  100.0 mmHg   Bicarbonate 36.5 (H) 20.0 - 24.0 mEq/L   Acid-Base Excess 14.3 (H) 0.0 - 2.0 mmol/L   O2 Saturation 87.2 %   Collection site LEFT RADIAL     Drawn by [id number]    Sample type ARTERIAL    Allens test (pass/fail) PASS PASS  Glucose, capillary     Status: None   Collection Time: 11/21/14 11:36 AM  Result Value Ref Range   Glucose-Capillary 89 65 - 99 mg/dL   Comment 1 Notify RN    Comment 2 Document in Chart   Glucose, capillary     Status: None   Collection Time: 11/21/14  4:00 PM  Result Value Ref Range   Glucose-Capillary 96 65 - 99 mg/dL   Comment 1 Notify RN    Comment 2 Document in Chart   Glucose, capillary     Status: None   Collection Time: 11/21/14  7:54 PM  Result Value Ref Range   Glucose-Capillary 98 65 - 99 mg/dL   Comment 1 Notify RN   Glucose, capillary     Status: Abnormal   Collection Time: 11/22/14 12:07 AM  Result Value Ref Range   Glucose-Capillary 153 (H) 65 - 99 mg/dL   Comment 1 Notify RN   Glucose, capillary     Status: Abnormal   Collection Time: 11/22/14  3:56 AM  Result Value Ref Range   Glucose-Capillary 157 (H) 65 - 99 mg/dL  Heparin level (unfractionated)     Status: None   Collection Time: 11/22/14  4:15 AM  Result Value Ref Range   Heparin Unfractionated 0.42 0.30 - 0.70 IU/mL    Comment:        IF HEPARIN RESULTS ARE BELOW EXPECTED VALUES, AND PATIENT DOSAGE HAS BEEN CONFIRMED, SUGGEST FOLLOW UP TESTING OF ANTITHROMBIN III LEVELS.   CBC     Status: Abnormal   Collection Time: 11/22/14  4:15 AM  Result Value Ref Range   WBC 21.4 (H) 4.0 - 10.5 K/uL   RBC 3.46 (L) 3.87 - 5.11 MIL/uL   Hemoglobin 9.3 (L) 12.0 - 15.0 g/dL   HCT 31.7 (L) 36.0 - 46.0 %   MCV 91.6 78.0 - 100.0 fL   MCH 26.9 26.0 - 34.0 pg   MCHC 29.3 (L) 30.0 - 36.0 g/dL   RDW 16.5 (H) 11.5 - 15.5 %   Platelets 223 150 - 400 K/uL  Basic metabolic panel     Status: Abnormal   Collection Time: 11/22/14  4:15 AM  Result Value Ref Range   Sodium 142 135 - 145 mmol/L   Potassium 3.2 (L) 3.5 - 5.1 mmol/L   Chloride 89 (L) 101 - 111 mmol/L   CO2 44 (H) 22 - 32 mmol/L   Glucose, Bld 137 (H) 65 - 99 mg/dL   BUN 17 6 -  20 mg/dL   Creatinine, Ser 0.51 0.44 - 1.00 mg/dL   Calcium 7.7 (L) 8.9 - 10.3 mg/dL   GFR calc non Af Amer >60 >60 mL/min   GFR calc Af Amer >60 >60 mL/min    Comment: (NOTE) The eGFR has been calculated using the CKD EPI equation. This calculation has not been validated in all clinical situations. eGFR's persistently <60 mL/min signify possible Chronic Kidney Disease.    Anion gap 9 5 - 15  Glucose, capillary     Status: Abnormal   Collection Time: 11/22/14  7:32 AM  Result Value Ref Range   Glucose-Capillary 121 (H) 65 - 99 mg/dL  Comment 1 Notify RN    Comment 2 Document in Chart     ABGS  Recent Labs  11/20/14 1430 11/21/14 0914  PHART 7.302* 7.390  PO2ART 56.3* 60.2*  TCO2 11.5  --   HCO3 34.6* 36.5*   CULTURES Recent Results (from the past 240 hour(s))  C difficile quick scan w PCR reflex     Status: None   Collection Time: 11/19/14 12:02 AM  Result Value Ref Range Status   C Diff antigen NEGATIVE NEGATIVE Final   C Diff toxin NEGATIVE NEGATIVE Final   C Diff interpretation Negative for toxigenic C. difficile  Final   Studies/Results: Dg Chest 1 View  11/21/2014   CLINICAL DATA:  Shortness of breath, respiratory failure, ventilatory support  EXAM: CHEST 1 VIEW  COMPARISON:  04/21/2014  FINDINGS: Endotracheal tube 4.5 cm above the carina. NG tube enters the stomach with the tip not visualized. Monitor leads overlie the chest. Exam is rotated to the left. Heart remains enlarged with small to moderate pleural effusions, larger on the left. Left greater than right basilar collapse/consolidation persist. No pneumothorax. Stable right PICC line position. Atherosclerosis noted of the aorta.  IMPRESSION: No significant change in left greater than right basilar collapse/ consolidation and associated pleural effusions.   Electronically Signed   By: Jerilynn Mages.  Shick M.D.   On: 11/21/2014 08:04   Dg Chest Port 1 View  11/22/2014   CLINICAL DATA:  Pneumonia.  Respiratory distress.   EXAM: PORTABLE CHEST 1 VIEW  COMPARISON:  11/21/2014  FINDINGS: Endotracheal and enteric tubes have been removed. Right PICC remains in place, grossly unchanged. Cardiac silhouette remains enlarged. Thoracic aortic calcification is noted. The patient remains mildly rotated to the left, less than on the prior study. Small right and moderate left pleural effusions do not appear significantly changed. Left greater than right basilar parenchymal lung opacities are unchanged, likely with a component of left lower lobe collapse. No pneumothorax is seen.  IMPRESSION: Unchanged, left greater than right pleural effusions and atelectasis/consolidation.   Electronically Signed   By: Logan Bores M.D.   On: 11/22/2014 07:45   Dg Chest Port 1 View  11/20/2014   CLINICAL DATA:  Patient with shortness of breath. History of congestive heart failure.  EXAM: PORTABLE CHEST 1 VIEW  COMPARISON:  Chest radiograph 11/18/2014  FINDINGS: Enteric tube courses to the inferior aspect of the radiograph, tip not included on current examination. Side-port appears at the GE junction. ET tube terminates in the mid trachea. Stable enlarged cardiac and mediastinal contours with moderate left and small right pleural effusions. Underlying left-greater-than-right pulmonary consolidation. No definite pneumothorax. Right upper extremity PICC line is present, unchanged from prior.  IMPRESSION: Interval advancement of enteric tube. The side port may be at the GE junction, consider advancement and correlation with abdominal radiography.  ET tube terminates in the mid trachea.  Persistent moderate left and small right pleural effusions with underlying pulmonary consolidation likely representing edema and atelectasis. Infection not excluded.   Electronically Signed   By: Lovey Newcomer M.D.   On: 11/20/2014 10:20    Medications:  Prior to Admission:  Prescriptions prior to admission  Medication Sig Dispense Refill Last Dose  . acetaminophen-codeine  (TYLENOL #3) 300-30 MG per tablet Take 1 tablet by mouth every 4 (four) hours as needed for moderate pain.   unknown  . amiodarone (PACERONE) 200 MG tablet Take 200 mg by mouth 2 (two) times daily.    11/23/2014 at Unknown time  .  aspirin EC 81 MG tablet Take 81 mg by mouth at bedtime.   11/10/2014 at Unknown time  . budesonide-formoterol (SYMBICORT) 160-4.5 MCG/ACT inhaler Inhale 2 puffs into the lungs 2 (two) times daily. 1 Inhaler 6 11/04/2014 at Unknown time  . clonazePAM (KLONOPIN) 0.5 MG tablet Take 0.25-0.5 mg by mouth every 6 (six) hours as needed for anxiety.    11/01/2014 at Unknown time  . clopidogrel (PLAVIX) 75 MG tablet Take 1 tablet (75 mg total) by mouth at bedtime.   11/10/2014 at Unknown time  . diltiazem (CARDIZEM CD) 300 MG 24 hr capsule TAKE 1 CAPSULE IN THE EVENING. 30 capsule 1 11/10/2014 at Unknown time  . furosemide (LASIX) 40 MG tablet Take 1 tablet (40 mg total) by mouth daily. (Patient taking differently: Take 40 mg by mouth every morning. ) 30 tablet 3 11/07/2014 at Unknown time  . guaiFENesin (MUCINEX) 600 MG 12 hr tablet Take 1 tablet by mouth every 12 hours as needed for cough and congestion   11/05/2014 at Unknown time  . ipratropium (ATROVENT) 0.02 % nebulizer solution USE 1 VIAL IN NEBULIZER EVERY 6 HOURS. (Patient taking differently: USE 1 VIAL IN NEBULIZER EVERY 4 HOURS FOR SHORTNESS OF BREATH/WHEEZING) 300 mL 1 UNKNOWN  . isosorbide mononitrate (IMDUR) 30 MG 24 hr tablet TAKE ONE TABLET BY MOUTH AT BEDTIME. (Patient taking differently: TAKE ONE TABLET BY MOUTH EVERY MORNING) 30 tablet 5 11/25/2014 at Unknown time  . latanoprost (XALATAN) 0.005 % ophthalmic solution Place 1 drop into both eyes at bedtime.   11/23/2014  . levalbuterol (XOPENEX HFA) 45 MCG/ACT inhaler Inhale 2 puffs into the lungs every 4 (four) hours as needed for wheezing. (Patient taking differently: Inhale 2 puffs into the lungs every 4 (four) hours as needed for wheezing or shortness of breath. ) 15 g 6 Past  Month at Unknown time  . mirtazapine (REMERON) 15 MG tablet Take 7.5 mg by mouth at bedtime. Take 1/2 tablet by mouth at bedtime   11/10/2014 at Unknown time  . nitroGLYCERIN (NITROSTAT) 0.4 MG SL tablet Place 0.4 mg under the tongue every 5 (five) minutes as needed for chest pain (may repeat x3). Chest pain   11/10/2014 at Unknown time  . nystatin (MYCOSTATIN) 100000 UNIT/ML suspension Take 5 mLs by mouth 4 (four) times daily.    11/05/2014 at Unknown time  . omeprazole (PRILOSEC) 20 MG capsule Take 20 mg by mouth daily as needed (for acid reflux).    unknown  . ondansetron (ZOFRAN) 8 MG tablet TAKE 1/2 TABLET ONCE DAILY AS NEEDED FOR NAUSEA   11/22/2014 at Unknown time  . OXYGEN Inhale 4.5 L into the lungs continuous.    10/29/2014 at Unknown time  . potassium chloride SA (K-DUR,KLOR-CON) 20 MEQ tablet Take 2 tablets by mouth every morning   11/16/2014 at Unknown time  . pravastatin (PRAVACHOL) 80 MG tablet Take 80 mg by mouth at bedtime.   11/10/2014 at Unknown time  . ranitidine (ZANTAC) 150 MG capsule Take 150 mg by mouth daily as needed for heartburn.    unknown at Unknown time  . tamsulosin (FLOMAX) 0.4 MG CAPS capsule TAKE 1 CAPSULE AT BEDTIME FOR INCONTINENCE.   11/10/2014 at Unknown time   Scheduled: . antiseptic oral rinse  7 mL Mouth Rinse BID  . antiseptic oral rinse  7 mL Mouth Rinse QID  . budesonide (PULMICORT) nebulizer solution  0.25 mg Nebulization BID  . chlorhexidine gluconate  15 mL Mouth Rinse BID  . feeding supplement (  PRO-STAT SUGAR FREE 64)  30 mL Per Tube Daily  . furosemide  80 mg Intravenous BID  . insulin aspart  0-15 Units Subcutaneous 6 times per day  . insulin glargine  7 Units Subcutaneous BID  . latanoprost  1 drop Both Eyes QHS  . levalbuterol  0.63 mg Nebulization Q6H  . methylPREDNISolone (SOLU-MEDROL) injection  30 mg Intravenous Q24H  . pantoprazole (PROTONIX) IV  40 mg Intravenous Daily  . piperacillin-tazobactam (ZOSYN)  IV  3.375 g Intravenous Q8H  .  sodium chloride  10-40 mL Intracatheter Q12H  . vancomycin  750 mg Intravenous Q12H   Continuous: . amiodarone 30 mg/hr (11/21/14 1517)  . feeding supplement (VITAL AF 1.2 CAL) Stopped (11/21/14 1000)  . fentaNYL infusion INTRAVENOUS Stopped (11/21/14 1000)  . heparin 1,000 Units/hr (11/21/14 2031)  . midazolam (VERSED) infusion Stopped (11/21/14 1000)   ZOX:[id number], fentaNYL (SUBLIMAZE) injection, fentaNYL (SUBLIMAZE) injection, ipratropium, LORazepam, midazolam, midazolam, ondansetron **OR** ondansetron (ZOFRAN) IV, sodium chloride  Assesment: She was admitted with healthcare associated pneumonia and ended up having acute on chronic respiratory failure requiring intubation and mechanical ventilation. She was extubated yesterday but has had more trouble since then. She does not want to be placed back on the ventilator. She is currently on BiPAP and tolerating that okay she has some element of combined systolic and diastolic heart failure from ischemic cardiomyopathy and had atrial flutter and has been on amiodarone. Principal Problem:   HCAP (healthcare-associated pneumonia) Active Problems:   Essential hypertension   Acute exacerbation of chronic obstructive pulmonary disease (COPD)   Acute on chronic respiratory failure with hypercapnia   Chronic combined systolic and diastolic congestive heart failure   Ischemic cardiomyopathy   Hyperglycemia, drug-induced   Atrial flutter   Obesity   Hypotension   Anemia, iron deficiency   Vitamin B12 deficiency   AAA (abdominal aortic aneurysm) without rupture   Acute on chronic systolic heart failure   AAA (abdominal aortic aneurysm)    Plan: We will of course honor her wishes not to put her back on the ventilator but try everything else in the meantime and she agrees with that. It might help to give her more steroids in the meantime and I ordered that. She remains severely sick with multiple problems and still requires high level  decision making. I don't think her family is ready to transition to comfort care at this point    LOS: 11 days   HAWKINS,EDWARD L 11/22/2014, 8:29 AM

## 2014-11-22 NOTE — Progress Notes (Signed)
Patient ID: TEMECA SOMMA, female   DOB: 26-Sep-1940, 74 y.o.   MRN: [id number]     Subjective:   Extubated yesterday day, on bipap overnight.   Objective:   Temp:  [97.5 F (36.4 C)-97.8 F (36.6 C)] 97.5 F (36.4 C) (09/27 0400) Pulse Rate:  [58-114] 111 (09/27 0600) Resp:  [14-22] 17 (09/27 0600) BP: (90-130)/(43-103) 109/91 mmHg (09/27 0600) SpO2:  [72 %-100 %] 96 % (09/27 0600) FiO2 (%):  [35 %-45 %] 45 % (09/27 0421) Last BM Date: 11/21/14  Filed Weights   11/19/14 0500 11/20/14 0500 11/21/14 0500  Weight: 180 lb 12.4 oz (82 kg) 187 lb 6.3 oz (85 kg) 186 lb 8.2 oz (84.6 kg)    Intake/Output Summary (Last 24 hours) at 11/22/14 0804 Last data filed at 11/22/14 0600  Gross per 24 hour  Intake 1098.41 ml  Output   5450 ml  Net -4351.59 ml    Telemetry: aflutter rates 80s-100s  Exam:  General: NAD  Resp: decreased breath sounds bilateral bases  Cardiac: irreg, no m/r/g  GI: abdomen soft, NT, ND  MSK: no LE edema  Neuro:  Lethargic but arouseable   Lab Results:  Basic Metabolic Panel:  Recent Labs Lab 11/20/14 0523 11/20/14 2136 11/21/14 0505 11/22/14 0415  NA 143  --  143 142  K 2.8* 3.3* 3.6 3.2*  CL 98*  --  94* 89*  CO2 38*  --  40* 44*  GLUCOSE 195*  --  164* 137*  BUN 22*  --  21* 17  CREATININE 0.47  --  0.48 0.51  CALCIUM 7.2*  --  7.6* 7.7*  MG 1.8  --   --   --     Liver Function Tests: No results for input(s): AST, ALT, ALKPHOS, BILITOT, PROT, ALBUMIN in the last 168 hours.  CBC:  Recent Labs Lab 11/20/14 0523 11/21/14 0505 11/22/14 0415  WBC 14.1* 22.2* 21.4*  HGB 9.0* 9.5* 9.3*  HCT 30.3* 32.5* 31.7*  MCV 91.5 92.1 91.6  PLT 207 241 223    Cardiac Enzymes:  Recent Labs Lab 11/20/14 2136 11/21/14 0320 11/21/14 0826  TROPONINI <0.03 <0.03 <0.03    BNP:  Recent Labs  12/29/13 1238 04/15/14 1417 10/18/14 1456  PROBNP 195.0* 230.0* 288.0*    Coagulation: No results for input(s): INR in the last 168  hours.  ECG:   Medications:   Scheduled Medications: . antiseptic oral rinse  7 mL Mouth Rinse BID  . antiseptic oral rinse  7 mL Mouth Rinse QID  . budesonide (PULMICORT) nebulizer solution  0.25 mg Nebulization BID  . chlorhexidine gluconate  15 mL Mouth Rinse BID  . feeding supplement (PRO-STAT SUGAR FREE 64)  30 mL Per Tube Daily  . insulin aspart  0-15 Units Subcutaneous 6 times per day  . insulin glargine  7 Units Subcutaneous BID  . latanoprost  1 drop Both Eyes QHS  . levalbuterol  0.63 mg Nebulization Q6H  . methylPREDNISolone (SOLU-MEDROL) injection  30 mg Intravenous Q24H  . pantoprazole (PROTONIX) IV  40 mg Intravenous Daily  . piperacillin-tazobactam (ZOSYN)  IV  3.375 g Intravenous Q8H  . sodium chloride  10-40 mL Intracatheter Q12H  . vancomycin  750 mg Intravenous Q12H     Infusions: . amiodarone 30 mg/hr (11/21/14 1517)  . feeding supplement (VITAL AF 1.2 CAL) Stopped (11/21/14 1000)  . fentaNYL infusion INTRAVENOUS Stopped (11/21/14 1000)  . heparin 1,000 Units/hr (11/21/14 2031)  . midazolam (VERSED) infusion Stopped (11/21/14  1000)     PRN Medications:  acetaminophen, fentaNYL (SUBLIMAZE) injection, fentaNYL (SUBLIMAZE) injection, ipratropium, LORazepam, midazolam, midazolam, ondansetron **OR** ondansetron (ZOFRAN) IV, sodium chloride     Assessment/Plan    1. Acute on chronic systolic HF - echo 0/6237 LVFE 30-35%, cannot eval diastolic function - CHF likely promoted by tachycardia and fluids in the setting of her sepsis and hypotension. Weight up during admit 165-->182 lbs.   - negative 4.1 liters yesterday, + 1.5 liters since admission. She is on lasix 80mg  IV bid, Cr and BUN remain stable. VP documented at 40. CXR with bilateral effusions, bilatearl basial opacities.  - consider low dose beta blocker once COPD exacerbation resolved. Soft bp's at times, will not start ACE-I.    2. COPD - per primary team   3. Pneumonia - per primary  team  4. Aflutter - continue amio and hep gtt - remains in flutter but rates are controlled. Once more stable from respiratory standpoint could consider cardioversion, however no urgency given her rates are well controlled.  - if extubated today will change to oral amio, stop heparin and start eliquis 5mg  bid.    Patient remains SOB on bipap. She is now DNR with no wishes to be reintubated. We will continue cardiac care as stated above.     Carlyle Dolly, M.D.

## 2014-11-22 NOTE — Care Management Note (Signed)
Case Management Note  Patient Details  Name: WELMA MCCOMBS MRN: [id number] Date of Birth: 1941/01/01  Expected Discharge Date:  11/15/14               Expected Discharge Plan:  [place]  In-House Referral:  NA  Discharge planning Services  CM Consult  Post Acute Care Choice:    Choice offered to:     DME Arranged:    DME Agency:     HH Arranged:    HH Agency:     Status of Service:  In process, will continue to follow  Medicare Important Message Given:  Yes-second notification given Date Medicare IM Given:    Medicare IM give by:    Date Additional Medicare IM Given:    Additional Medicare Important Message give by:     If discussed at [place] of Stay Meetings, dates discussed:  11/22/2014  Additional Comments: Pt has been extubated but is still critically ill. Pt on BIPAP and will not be re-intubated, pt is a DNR. Palliative consult has been made for GOC. Will cont to follow.  [name], RN 11/22/2014, 2:20 PM

## 2014-11-22 NOTE — Progress Notes (Signed)
Pt resting on BIPAP throughout night on PC 4/6. VT 400-425. Placed on BUR of 12 due to decreased pt effort after ativan. Rt will hold on AM ABG at this time (since the change to DNR and no reintubation) until off Bipap for 75min.

## 2014-11-22 NOTE — Progress Notes (Signed)
Had conversation with husband [name] and 2 of his brothers this afternoon regarding patient's continued decline, now requiring a venti mask to maintain sats. She still exhibits increased WOB and is more lethargic as the day progresses. [name] tells me she did not want to be resuscitated and he does not want her to suffer. Plan to transition to comfort. Given respiratory distress will start on a morphine drip. Will continue to follow.  Domingo Mend, MD [facility] Pager: [phone]

## 2014-11-22 NOTE — Progress Notes (Signed)
ANTICOAGULATION Follow Up NOTE  Pharmacy Consult for Heparin Indication: atrial fibrillation  Allergies  Allergen Reactions  . Albuterol Other (See Comments)    Jittery/shakiness  . Levaquin [Levofloxacin In D5w] Nausea Only  . Sulfa Antibiotics Nausea Only   Patient Measurements: Height: 5\' 3"  (160 cm) Weight: 186 lb 8.2 oz (84.6 kg) IBW/kg (Calculated) : 52.4 HEPARIN DW (KG): 68.3  Vital Signs: Temp: 97.5 F (36.4 C) (09/27 0400) Temp Source: Axillary (09/27 0400) BP: 109/91 mmHg (09/27 0600) Pulse Rate: 111 (09/27 0600)  Labs:  Recent Labs  11/20/14 0522  11/20/14 0523 11/20/14 2136 11/21/14 0320 11/21/14 0505 11/21/14 0826 11/22/14 0415  HGB  --   < > 9.0*  --   --  9.5*  --  9.3*  HCT  --   --  30.3*  --   --  32.5*  --  31.7*  PLT  --   --  207  --   --  241  --  223  HEPARINUNFRC 0.41  --   --   --   --  0.42  --  0.42  CREATININE  --   --  0.47  --   --  0.48  --  0.51  TROPONINI  --   --   --  <0.03 <0.03  --  <0.03  --   < > = values in this interval not displayed. Estimated Creatinine Clearance: 64.6 mL/min (by C-G formula based on Cr of 0.51).  Medications:  Scheduled:  . antiseptic oral rinse  7 mL Mouth Rinse BID  . antiseptic oral rinse  7 mL Mouth Rinse QID  . budesonide (PULMICORT) nebulizer solution  0.25 mg Nebulization BID  . chlorhexidine gluconate  15 mL Mouth Rinse BID  . feeding supplement (PRO-STAT SUGAR FREE 64)  30 mL Per Tube Daily  . insulin aspart  0-15 Units Subcutaneous 6 times per day  . insulin glargine  7 Units Subcutaneous BID  . latanoprost  1 drop Both Eyes QHS  . levalbuterol  0.63 mg Nebulization Q6H  . methylPREDNISolone (SOLU-MEDROL) injection  30 mg Intravenous Q24H  . pantoprazole (PROTONIX) IV  40 mg Intravenous Daily  . piperacillin-tazobactam (ZOSYN)  IV  3.375 g Intravenous Q8H  . sodium chloride  10-40 mL Intracatheter Q12H  . vancomycin  750 mg Intravenous Q12H   Assessment: Okay for Protocol, PT/INR WNL  on 9/16.  H/H stable.  HL is therapeutic   Goal of Therapy:  Heparin level 0.3-0.7 units/ml Monitor platelets by anticoagulation protocol: Yes   Plan:  Continue Heparin drip @ 1000 units/hr Daily Hep Level and CBC while on Heparin  Hart Robinsons, PharmD Clinical Pharmacist  11/22/2014,7:42 AM

## 2014-11-22 NOTE — Progress Notes (Signed)
The Pt is comfort care. Pt doesn't want to wear the bipap or be intubated. RT placed the Pt on 5LNC and ativan was given to help with her anxiety

## 2014-11-22 NOTE — Progress Notes (Signed)
Present with patient's husband [name] for support. He shared about coming to the decision of his wife not being intubated again according to her wishes. He also shared again his concerns about other health challenges. They have a very supportive family system. Their faith support is also strong and helpful for them. He continues to be faithful in his presence and very appreciate of the good care she is receiving from our care team. Will continue to support them.

## 2014-11-22 NOTE — Clinical Documentation Improvement (Signed)
Internal Medicine  Conflicting documentation regarding Sepsis is noted in the chart. Documented daily by Cardiology; not seen in Medicine/Pulmonary notes. Please clarify if Sepsis ruled in (present or evolving on admit) or has ruled out and document findings in next progress note and include in discharge summary if applicable. }  Other  Clinically Undetermined  Document any associated diagnoses/conditions.  Source of infection: pneumonia - specify type/organism causing infection if known  Multi organ failure  Elevated WBC's   Being treated with IV Zosyn Q8H and IV Vancocin Q12H  Please exercise your independent, professional judgment when responding. A specific answer is not anticipated or expected.  Thank You,  Zoila Shutter BSN, [place] [phone]

## 2014-11-23 DIAGNOSIS — J9622 Acute and chronic respiratory failure with hypercapnia: Secondary | ICD-10-CM

## 2014-11-23 DIAGNOSIS — Z515 Encounter for palliative care: Secondary | ICD-10-CM

## 2014-11-23 NOTE — Consult Note (Signed)
Consultation Note Date: 11/23/2014   Patient Name: [name] [name]  DOB: 10/27/1940  MRN: [id number]  Age / Sex: 74 y.o., female   PCP: Dione Housekeeper, MD Referring Physician: Orvan Falconer, MD  Reason for Consultation: Psychosocial/spiritual support and Terminal care  Palliative Care Assessment and Plan Summary of Established Goals of Care and Medical Treatment Preferences   Clinical Assessment/Narrative: [name] [name] is resting quietly in bed today, she does not open her eyes for me.  Her husband and 2 brothers in law are at her bedside.  We talk about [name] [name]'s comfort and current medication to manage her pain/dyspnea.  Her husband tells me about her hospitalization 7-8 months ago and how she has been unable to walk since, just transfer.  Mr. Coggeshall share with me how even being unable to walk, [name] still enjoyed cooking and directing Mr. Ullman in cooking.   He tells me about her desire to have "no more medications", and shows me a paper where she has written this.  She had also written, "take this off", and we talk about the mask on her face, that some people find the mask uncomfortable.  I share with them that we can change this is nasal canula if/when they are ready.  Mr. Macaluso asks if she will struggle and I share that we can manage any discomfort with her morphine infusion.    Contacts/Participants in Discussion: Primary Decision Maker: Mr. Curnow is primary decision maker at this time.  HCPOA: no  Mr. Dhillon and his two brothers are present for the meeting.   Code Status/Advance Care Planning:  DNR  Symptom Management:   Morphine infusion at 1 mg per hour IV, no bolus   Palliative Prophylaxis: none, recommend dulcolax suppository PR QD PRN.   Psycho-social/Spiritual:   Support System: Lives with husband of 40+ years, brothers in law at bedside, no siblings.   Desire for further Chaplaincy support:Ongoing   Prognosis: Hours - Days  Discharge Planning:  Likely hospital death, ok to go  to in patient hospice if appropriate.        Chief Complaint:  SOB History of Present Illness:  [name] [name] is an 74 y.o. female with hx of severe COPD on home oxygen, hx of CHF, known CAD, HTN, HLD, prior CVA, AAA, carotid stenosis, hx of VT, SVT, presented to the ER with progressive SOB, but no productive coughs, fever, or chills. She had a venous blood gas, showing pH of 7.28, and a CXR showing new right sided infiltrate. Her lactic acid was 1.59, and her WBC was 14K, with normal renal Fx tests. She was placed on Bipap, and felt better. A repeat ABG showed 7/36/64/pOx=108 on Bipap. She was started on IV Van/Zosyn for HCAP and COPD exacerbation with acute on chronic hypoxic and hypercarbic respiratory failure. Hospitalist was asked to admit her for same. She worsened during her hospitalization and her husband elected to have her intubated for a trial.  She has been extubated and they do not want her re intubated.  She has not been able to improve significantly and the family has decided to transition to comfort care.   Primary Diagnoses  Present on Admission:  . Acute exacerbation of chronic obstructive pulmonary disease (COPD) . Acute on chronic respiratory failure with hypercapnia . HCAP (healthcare-associated pneumonia) . Chronic combined systolic and diastolic congestive heart failure . Ischemic cardiomyopathy . Hyperglycemia, drug-induced . Essential hypertension . Atrial flutter . Obesity . Anemia, iron deficiency . Vitamin B12 deficiency .  AAA (abdominal aortic aneurysm) without rupture  Palliative Review of Systems: Mrs. Genther is unable to participate in ROS at this time, No s/s of pain or dyspnea at this time.  I have reviewed the medical record, interviewed the patient and family, and examined the patient. The following aspects are pertinent.  Past Medical History  Diagnosis Date  . Hypertension   . Hyperlipidemia   . Cerebrovascular disease, unspecified   .  Unspecified glaucoma   . Obesity, unspecified   . Coronary artery disease     cath 04/21/14:severe multivessel disease with a left dominant coronary system, chronic occlusion of the LAD, and heavily calcified severe stenosis of the proximal left circumflex.  She underwent rotational atherectomy and stenting of the left circumflex  . Ventricular tachycardia   . SVT (supraventricular tachycardia)   . AAA (abdominal aortic aneurysm)   . COPD (chronic obstructive pulmonary disease) 2013    [place] of breath     WITH ACTIVITY--USES OXYGEN AS NEEDED  2&1/2 L PER NASAL CANNUL  . Complication of anesthesia     PT STATES SHE CAN NOT BE PUT TO SLEEP BECAUSE OF HER LUNG PROBLEMS  . Carotid artery occlusion   . Anemia   . CHF (congestive heart failure)   . Abdominal aneurysm     "hasn't been repaired" (10/20/2013)  . Pneumonia X ~ 2  . On home oxygen therapy     "4L; 24/7" (10/20/2013)  . GERD (gastroesophageal reflux disease)   . Migraines     "before tubal ligation I had them alot; seldom have one anymore" (10/20/2013)  . Arthritis     "knees mainly" (10/20/2013)  . Bladder cancer 2009  . Atrial fibrillation   . Bell palsy 2014   Social History   Social History  . Marital Status: Married    Spouse Name: N/A  . Number of Children: 2  . Years of Education: N/A   Occupational History  . Retired     Social History Main Topics  . Smoking status: Former Smoker -- 2.00 packs/day for 50 years    Types: Cigarettes    Quit date: 05/18/2011  . Smokeless tobacco: Never Used  . Alcohol Use: No  . Drug Use: No  . Sexual Activity: No   Other Topics Concern  . None   Social History Narrative   Lives in [place], [place] with husband.    Family History  Problem Relation Age of Onset  . Heart disease Mother 15  . Heart disease Father   . Hyperlipidemia Father   . Hypertension Father   . Diabetes Daughter   . Peripheral vascular disease Daughter    Scheduled Meds: .  antiseptic oral rinse  7 mL Mouth Rinse BID   Continuous Infusions: . morphine 1 mg/hr (11/22/14 1543)   PRN Meds:.acetaminophen, LORazepam Medications Prior to Admission:  Prior to Admission medications   Medication Sig Start Date End Date Taking? Authorizing Provider  acetaminophen-codeine (TYLENOL #3) 300-30 MG per tablet Take 1 tablet by mouth every 4 (four) hours as needed for moderate pain.   Yes Historical Provider, MD  amiodarone (PACERONE) 200 MG tablet Take 200 mg by mouth 2 (two) times daily.  09/20/11  Yes Lelon Perla, MD  aspirin EC 81 MG tablet Take 81 mg by mouth at bedtime.   Yes Historical Provider, MD  budesonide-formoterol (SYMBICORT) 160-4.5 MCG/ACT inhaler Inhale 2 puffs into the lungs 2 (two) times daily. 09/07/14  Yes Tanda Rockers,  MD  clonazePAM (KLONOPIN) 0.5 MG tablet Take 0.25-0.5 mg by mouth every 6 (six) hours as needed for anxiety.    Yes Historical Provider, MD  clopidogrel (PLAVIX) 75 MG tablet Take 1 tablet (75 mg total) by mouth at bedtime. 12/26/11  Yes Festus Aloe, MD  diltiazem (CARDIZEM CD) 300 MG 24 hr capsule TAKE 1 CAPSULE IN THE EVENING. 02/07/14  Yes Lelon Perla, MD  furosemide (LASIX) 40 MG tablet Take 1 tablet (40 mg total) by mouth daily. Patient taking differently: Take 40 mg by mouth every morning.  04/17/14  Yes Reyne Dumas, MD  guaiFENesin (MUCINEX) 600 MG 12 hr tablet Take 1 tablet by mouth every 12 hours as needed for cough and congestion   Yes Historical Provider, MD  ipratropium (ATROVENT) 0.02 % nebulizer solution USE 1 VIAL IN NEBULIZER EVERY 6 HOURS. Patient taking differently: USE 1 VIAL IN NEBULIZER EVERY 4 HOURS FOR SHORTNESS OF BREATH/WHEEZING 03/14/14  Yes Tanda Rockers, MD  isosorbide mononitrate (IMDUR) 30 MG 24 hr tablet TAKE ONE TABLET BY MOUTH AT BEDTIME. Patient taking differently: TAKE ONE TABLET BY MOUTH EVERY MORNING 08/12/14  Yes Lelon Perla, MD  latanoprost (XALATAN) 0.005 % ophthalmic solution Place 1  drop into both eyes at bedtime.   Yes Historical Provider, MD  levalbuterol [facility] HFA) 45 MCG/ACT inhaler Inhale 2 puffs into the lungs every 4 (four) hours as needed for wheezing. Patient taking differently: Inhale 2 puffs into the lungs every 4 (four) hours as needed for wheezing or shortness of breath.  09/07/13  Yes Tammy S Parrett, NP  mirtazapine (REMERON) 15 MG tablet Take 7.5 mg by mouth at bedtime. Take 1/2 tablet by mouth at bedtime   Yes Historical Provider, MD  nitroGLYCERIN (NITROSTAT) 0.4 MG SL tablet Place 0.4 mg under the tongue every 5 (five) minutes as needed for chest pain (may repeat x3). Chest pain 07/10/10  Yes Lelon Perla, MD  nystatin (MYCOSTATIN) 100000 UNIT/ML suspension Take 5 mLs by mouth 4 (four) times daily.  04/04/14  Yes Historical Provider, MD  omeprazole (PRILOSEC) 20 MG capsule Take 20 mg by mouth daily as needed (for acid reflux).    Yes Historical Provider, MD  ondansetron (ZOFRAN) 8 MG tablet TAKE 1/2 TABLET ONCE DAILY AS NEEDED FOR NAUSEA 10/19/14  Yes Historical Provider, MD  OXYGEN Inhale 4.5 L into the lungs continuous.    Yes Historical Provider, MD  potassium chloride SA (K-DUR,KLOR-CON) 20 MEQ tablet Take 2 tablets by mouth every morning   Yes Historical Provider, MD  pravastatin (PRAVACHOL) 80 MG tablet Take 80 mg by mouth at bedtime.   Yes Historical Provider, MD  ranitidine (ZANTAC) 150 MG capsule Take 150 mg by mouth daily as needed for heartburn.    Yes Historical Provider, MD  tamsulosin (FLOMAX) 0.4 MG CAPS capsule TAKE 1 CAPSULE AT BEDTIME FOR INCONTINENCE. 10/19/14  Yes Historical Provider, MD   Allergies  Allergen Reactions  . Albuterol Other (See Comments)    Jittery/shakiness  . Levaquin [Levofloxacin In D5w] Nausea Only  . Sulfa Antibiotics Nausea Only   CBC:    Component Value Date/Time   WBC 21.4* 11/22/2014 0415   HGB 9.3* 11/22/2014 0415   HCT 31.7* 11/22/2014 0415   PLT 223 11/22/2014 0415   MCV 91.6 11/22/2014 0415    NEUTROABS 11.6* 11/17/2014 1903   LYMPHSABS 1.3 11/25/2014 1903   MONOABS 1.2* 11/07/2014 1903   EOSABS 0.0 11/06/2014 1903   BASOSABS 0.0 11/09/2014 1903  Comprehensive Metabolic Panel:    Component Value Date/Time   NA 142 11/22/2014 0415   K 3.2* 11/22/2014 0415   CL 89* 11/22/2014 0415   CO2 44* 11/22/2014 0415   BUN 17 11/22/2014 0415   CREATININE 0.51 11/22/2014 0415   CREATININE 0.63 08/01/2011 1554   GLUCOSE 137* 11/22/2014 0415   CALCIUM 7.7* 11/22/2014 0415   AST 64* 11/15/2014 0415   ALT 40 11/15/2014 0415   ALKPHOS 55 11/15/2014 0415   BILITOT 0.8 11/15/2014 0415   PROT 5.5* 11/15/2014 0415   ALBUMIN 2.8* 11/15/2014 0415    Physical Exam: Vital Signs: BP 104/57 mmHg  Pulse 70  Temp(Src) 99.4 F (37.4 C) (Axillary)  Resp 25  Ht 5\' 3"  (1.6 m)  Wt 78.2 kg (172 lb 6.4 oz)  BMI 30.55 kg/m2  SpO2 97% SpO2: SpO2: 97 % O2 Device: O2 Device: Nasal Cannula O2 Flow Rate: O2 Flow Rate (L/min): 2 L/min Intake/output summary:  Intake/Output Summary (Last 24 hours) at 11/23/14 1106 Last data filed at 11/23/14 0500  Gross per 24 hour  Intake  244.3 ml  Output   2100 ml  Net -1855.7 ml   LBM: Last BM Date: 11/22/14 Baseline Weight: Weight: 74.8 kg (164 lb 14.5 oz) Most recent weight: Weight: 78.2 kg (172 lb 6.4 oz)  Exam Findings:  Constitutional:  Elderly frail, lying in bed with eye closed, fan on face, does not interact with me.  Resp:  Non re breather, even respiration,  GI: Abd soft, rounded.          Palliative Performance Scale: 10%              Additional Data Reviewed: Recent Labs     11/21/14  0505  11/22/14  0415  WBC  22.2*  21.4*  HGB  9.5*  9.3*  PLT  241  223  NA  143  142  BUN  21*  17  CREATININE  0.48  0.51     Time In: 1015 Time Out: 1115 Time Total: 60 minutes Greater than 50%  of this time was spent counseling and coordinating care related to the above assessment and plan. [place] discussion shared with nursing staff, SW, CM,  and Dr. Marin Comment.   Signed by: Drue Novel, NP  Drue Novel, NP  11/23/2014, 11:06 AM  Please contact Palliative Medicine Team phone at [phone] for questions and concerns.

## 2014-11-23 NOTE — Progress Notes (Signed)
She has elected comfort care. She appears to be comfortable now and is unresponsive. I will sign off. Thanks for allowing me to see her with you

## 2014-11-23 NOTE — Progress Notes (Signed)
Patient has transitioned to comfort care by family and primary team. Cardiology will sign off inpatient care.  Zandra Abts MD

## 2014-11-23 NOTE — Progress Notes (Signed)
200 mL of morphine wasted with Carolanne Grumbling, RN.

## 2014-11-23 NOTE — Progress Notes (Signed)
[facility] PROGRESS NOTE  [name] [name] NOM:[id number] DOB: December 03, 1940    PCP:   Sherrie Mustache, MD   HPI: [name] [name] is an 74 y.o. female with hx of severe COPD, CHF, hx of HTN, CVA, afib, admitted for respiratory failure by me, now has elected to only receive comfort care.  Her family has been at her bedside.  Currently on oxygen supplementation and morphine drip. She appears comfortable.   Rewiew of Systems: She appears comfortable.   Past Medical History  Diagnosis Date  . Hypertension   . Hyperlipidemia   . Cerebrovascular disease, unspecified   . Unspecified glaucoma   . Obesity, unspecified   . Coronary artery disease     cath 04/21/14:severe multivessel disease with a left dominant coronary system, chronic occlusion of the LAD, and heavily calcified severe stenosis of the proximal left circumflex.  She underwent rotational atherectomy and stenting of the left circumflex  . Ventricular tachycardia   . SVT (supraventricular tachycardia)   . AAA (abdominal aortic aneurysm)   . COPD (chronic obstructive pulmonary disease) 2013    [place] of breath     WITH ACTIVITY--USES OXYGEN AS NEEDED  2&1/2 L PER NASAL CANNUL  . Complication of anesthesia     PT STATES SHE CAN NOT BE PUT TO SLEEP BECAUSE OF HER LUNG PROBLEMS  . Carotid artery occlusion   . Anemia   . CHF (congestive heart failure)   . Abdominal aneurysm     "hasn't been repaired" (10/20/2013)  . Pneumonia X ~ 2  . On home oxygen therapy     "4L; 24/7" (10/20/2013)  . GERD (gastroesophageal reflux disease)   . Migraines     "before tubal ligation I had them alot; seldom have one anymore" (10/20/2013)  . Arthritis     "knees mainly" (10/20/2013)  . Bladder cancer 2009  . Atrial fibrillation   . Bell palsy 2014    Past Surgical History  Procedure Laterality Date  . Cystoscopy    . Bladder tumor excision      resection of 5 bladder, and fulguration of 1 small bladder tumor  .  Cystoscopy      cold cup bladder biopsy of five tumors, and fulguration of one tumor  . Wedge resection Right 1980's    Remote right lung  . Cystoscopy with biopsy  12/24/2011    Procedure: CYSTOSCOPY WITH BIOPSY;  Surgeon: Fredricka Bonine, MD;  Location: [facility];  Service: Urology;  Laterality: N/A;  . Tubal ligation Bilateral 1980's  . Cataract extraction w/ intraocular lens  implant, bilateral Bilateral ~ 2011  . Cardiac catheterization  "several"    "too bad to put stents in"  . Left heart catheterization with coronary angiogram N/A 04/18/2014    Procedure: LEFT HEART CATHETERIZATION WITH CORONARY ANGIOGRAM;  Surgeon: Jolaine Artist, MD;  Location: [facility] CATH LAB;  Service: Cardiovascular;  Laterality: N/A;  . Percutaneous coronary rotoblator intervention (pci-r) N/A 04/21/2014    Procedure: PERCUTANEOUS CORONARY ROTOBLATOR INTERVENTION (PCI-R);  Surgeon: Blane Ohara, MD;  Location: [facility] CATH LAB;  Service: Cardiovascular;  Laterality: N/A;    Medications:  HOME MEDS: Prior to Admission medications   Medication Sig Start Date End Date Taking? Authorizing Provider  acetaminophen-codeine (TYLENOL #3) 300-30 MG per tablet Take 1 tablet by mouth every 4 (four) hours as needed for moderate pain.   Yes Historical Provider, MD  amiodarone (PACERONE) 200 MG tablet Take 200 mg by mouth 2 (  two) times daily.  09/20/11  Yes Lelon Perla, MD  aspirin EC 81 MG tablet Take 81 mg by mouth at bedtime.   Yes Historical Provider, MD  budesonide-formoterol (SYMBICORT) 160-4.5 MCG/ACT inhaler Inhale 2 puffs into the lungs 2 (two) times daily. 09/07/14  Yes Tanda Rockers, MD  clonazePAM (KLONOPIN) 0.5 MG tablet Take 0.25-0.5 mg by mouth every 6 (six) hours as needed for anxiety.    Yes Historical Provider, MD  clopidogrel (PLAVIX) 75 MG tablet Take 1 tablet (75 mg total) by mouth at bedtime. 12/26/11  Yes Festus Aloe, MD  diltiazem (CARDIZEM CD) 300 MG 24 hr capsule TAKE 1 CAPSULE IN THE  EVENING. 02/07/14  Yes Lelon Perla, MD  furosemide (LASIX) 40 MG tablet Take 1 tablet (40 mg total) by mouth daily. Patient taking differently: Take 40 mg by mouth every morning.  04/17/14  Yes Reyne Dumas, MD  guaiFENesin (MUCINEX) 600 MG 12 hr tablet Take 1 tablet by mouth every 12 hours as needed for cough and congestion   Yes Historical Provider, MD  ipratropium (ATROVENT) 0.02 % nebulizer solution USE 1 VIAL IN NEBULIZER EVERY 6 HOURS. Patient taking differently: USE 1 VIAL IN NEBULIZER EVERY 4 HOURS FOR SHORTNESS OF BREATH/WHEEZING 03/14/14  Yes Tanda Rockers, MD  isosorbide mononitrate (IMDUR) 30 MG 24 hr tablet TAKE ONE TABLET BY MOUTH AT BEDTIME. Patient taking differently: TAKE ONE TABLET BY MOUTH EVERY MORNING 08/12/14  Yes Lelon Perla, MD  latanoprost (XALATAN) 0.005 % ophthalmic solution Place 1 drop into both eyes at bedtime.   Yes Historical Provider, MD  levalbuterol [facility] HFA) 45 MCG/ACT inhaler Inhale 2 puffs into the lungs every 4 (four) hours as needed for wheezing. Patient taking differently: Inhale 2 puffs into the lungs every 4 (four) hours as needed for wheezing or shortness of breath.  09/07/13  Yes Tammy S Parrett, NP  mirtazapine (REMERON) 15 MG tablet Take 7.5 mg by mouth at bedtime. Take 1/2 tablet by mouth at bedtime   Yes Historical Provider, MD  nitroGLYCERIN (NITROSTAT) 0.4 MG SL tablet Place 0.4 mg under the tongue every 5 (five) minutes as needed for chest pain (may repeat x3). Chest pain 07/10/10  Yes Lelon Perla, MD  nystatin (MYCOSTATIN) 100000 UNIT/ML suspension Take 5 mLs by mouth 4 (four) times daily.  04/04/14  Yes Historical Provider, MD  omeprazole (PRILOSEC) 20 MG capsule Take 20 mg by mouth daily as needed (for acid reflux).    Yes Historical Provider, MD  ondansetron (ZOFRAN) 8 MG tablet TAKE 1/2 TABLET ONCE DAILY AS NEEDED FOR NAUSEA 10/19/14  Yes Historical Provider, MD  OXYGEN Inhale 4.5 L into the lungs continuous.    Yes Historical  Provider, MD  potassium chloride SA (K-DUR,KLOR-CON) 20 MEQ tablet Take 2 tablets by mouth every morning   Yes Historical Provider, MD  pravastatin (PRAVACHOL) 80 MG tablet Take 80 mg by mouth at bedtime.   Yes Historical Provider, MD  ranitidine (ZANTAC) 150 MG capsule Take 150 mg by mouth daily as needed for heartburn.    Yes Historical Provider, MD  tamsulosin (FLOMAX) 0.4 MG CAPS capsule TAKE 1 CAPSULE AT BEDTIME FOR INCONTINENCE. 10/19/14  Yes Historical Provider, MD     Allergies:  Allergies  Allergen Reactions  . Albuterol Other (See Comments)    Jittery/shakiness  . Levaquin [Levofloxacin In D5w] Nausea Only  . Sulfa Antibiotics Nausea Only    Social History:   reports that she quit smoking about 3  years ago. Her smoking use included Cigarettes. She has a 100 pack-year smoking history. She has never used smokeless tobacco. She reports that she does not drink alcohol or use illicit drugs.  Family History: Family History  Problem Relation Age of Onset  . Heart disease Mother 41  . Heart disease Father   . Hyperlipidemia Father   . Hypertension Father   . Diabetes Daughter   . Peripheral vascular disease Daughter      Physical Exam: Filed Vitals:   11/23/14 0400 11/23/14 0500 11/23/14 0600 11/23/14 0820  BP:      Pulse: 90 99 84   Temp:    99.4 F (37.4 C)  TempSrc:    Axillary  Resp: 14 18 22    Height:      Weight:      SpO2: 96% 92% 96%    Blood pressure 104/57, pulse 84, temperature 99.4 F (37.4 C), temperature source Axillary, resp. rate 22, height 5\' 3"  (1.6 m), weight 78.2 kg (172 lb 6.4 oz), SpO2 96 %.  GEN:  Pleasant  patient lying in the stretcher in no acute distress; cooperative with exam. PSYCH:  alert and oriented x4; does not appear anxious or depressed; affect is appropriate. HEENT: Mucous membranes pink and anicteric; PERRLA; EOM intact; no cervical lymphadenopathy nor thyromegaly or carotid bruit; no JVD; There were no stridor. Neck is very  supple. Breasts:: Not examined CHEST WALL: No tenderness CHEST: dyspneic.  HEART: Regular rate and rhythm.  There are no murmur, rub, or gallops.   BACK: No kyphosis or scoliosis; no CVA tenderness ABDOMEN: soft and non-tender; no masses, no organomegaly, normal abdominal bowel sounds; no pannus; no intertriginous candida. There is no rebound and no distention. Rectal Exam: Not done EXTREMITIES: No bone or joint deformity; age-appropriate arthropathy of the hands and knees; no edema; no ulcerations.  There is no calf tenderness. Genitalia: not examined PULSES: 2+ and symmetric SKIN: Normal hydration no rash or ulceration CNS: Cranial nerves 2-12 grossly intact no focal lateralizing neurologic deficit.  Speech is fluent; uvula elevated with phonation, facial symmetry and tongue midline. DTR are normal bilaterally, cerebella exam is intact, barbinski is negative and strengths are equaled bilaterally.  No sensory loss.   Labs on Admission:  Basic Metabolic Panel:  Recent Labs Lab 11/18/14 0415 11/19/14 0451 11/20/14 0523 11/20/14 2136 11/21/14 0505 11/22/14 0415  NA 140 143 143  --  143 142  K 3.3* 2.2* 2.8* 3.3* 3.6 3.2*  CL 100* 95* 98*  --  94* 89*  CO2 33* 37* 38*  --  40* 44*  GLUCOSE 173* 161* 195*  --  164* 137*  BUN 27* 25* 22*  --  21* 17  CREATININE 0.64 0.47 0.47  --  0.48 0.51  CALCIUM 7.5* 7.0* 7.2*  --  7.6* 7.7*  MG  --   --  1.8  --   --   --    CBC:  Recent Labs Lab 11/18/14 0415 11/19/14 0451 11/20/14 0523 11/21/14 0505 11/22/14 0415  WBC 13.1* 13.4* 14.1* 22.2* 21.4*  HGB 9.6* 9.2* 9.0* 9.5* 9.3*  HCT 32.2* 30.9* 30.3* 32.5* 31.7*  MCV 90.4 90.9 91.5 92.1 91.6  PLT 241 216 207 241 223   Cardiac Enzymes:  Recent Labs Lab 11/20/14 2136 11/21/14 0320 11/21/14 0826  TROPONINI <0.03 <0.03 <0.03    CBG:  Recent Labs Lab 11/21/14 1954 11/22/14 0007 11/22/14 0356 11/22/14 0732 11/22/14 1134  GLUCAP 98 153* 157* 121* 124*  Radiological Exams on Admission: Dg Chest Port 1 View  11/22/2014   CLINICAL DATA:  Pneumonia.  Respiratory distress.  EXAM: PORTABLE CHEST 1 VIEW  COMPARISON:  11/21/2014  FINDINGS: Endotracheal and enteric tubes have been removed. Right PICC remains in place, grossly unchanged. Cardiac silhouette remains enlarged. Thoracic aortic calcification is noted. The patient remains mildly rotated to the left, less than on the prior study. Small right and moderate left pleural effusions do not appear significantly changed. Left greater than right basilar parenchymal lung opacities are unchanged, likely with a component of left lower lobe collapse. No pneumothorax is seen.  IMPRESSION: Unchanged, left greater than right pleural effusions and atelectasis/consolidation.   Electronically Signed   By: Logan Bores M.D.   On: 11/22/2014 07:45    Assessment/Plan Present on Admission:  . Acute exacerbation of chronic obstructive pulmonary disease (COPD) . Acute on chronic respiratory failure with hypercapnia . HCAP (healthcare-associated pneumonia) . Chronic combined systolic and diastolic congestive heart failure . Ischemic cardiomyopathy . Hyperglycemia, drug-induced . Essential hypertension . Atrial flutter . Obesity . Anemia, iron deficiency . Vitamin B12 deficiency . AAA (abdominal aortic aneurysm) without rupture  PLAN:  Respiratory failure and CHF:  Continue with comfort care.  Will transfer her to the floor today.  Continue with morphine drip.  Her death is imminent and patient and family are aware.     Other plans as per orders.  Code Status: DNR/CMO.    Orvan Falconer, MD. [facility] Pager [phone] 7pm to 7am.  11/23/2014, 8:43 AM

## 2014-11-26 NOTE — Care Management Note (Signed)
Case Management Note  Patient Details  Name: [name] [name] MRN: [id number] Date of Birth: 09/24/1940  Expected Discharge Date:  11/15/14               Expected Discharge Plan:  [place]  In-House Referral:  NA, Hospice / Palliative Care  Discharge planning Services  CM Consult  Post Acute Care Choice:    Choice offered to:     DME Arranged:    DME Agency:     HH Arranged:    HH Agency:     Status of Service:  In process, will continue to follow  Medicare Important Message Given:  Yes-second notification given Date Medicare IM Given:    Medicare IM give by:    Date Additional Medicare IM Given:    Additional Medicare Important Message give by:     If discussed at [place] of Stay Meetings, dates discussed:  12-06-14  Additional Comments: Pt has been made comfort care, on morphine gtt and moved from ICU.  [name], RN Dec 06, 2014, 1:53 PM

## 2014-11-26 NOTE — Progress Notes (Signed)
PT TRANSFERRING TO ROOM 308.AS COMFORT CARE PT. FOLEY AND FLEXISEAL PATENT. RT PICC LINE PATENT W/ MORPHINE DRIP INFUSING AT 2MG /HR.FAMILY REMAINS AT BEDSIDE. REPORT GIVEN TO JESSICA MAYS RN ON 300.

## 2014-11-26 NOTE — Progress Notes (Signed)
[place] her to pick up patient.  Family at bedside.  Emotional support given.

## 2014-11-26 NOTE — Progress Notes (Signed)
RN called to patient's room by family.  Pt found breathless and without pulse.  Time of death 2:10 PM.  Verified with two RN's.  Family updated at bedside.  Family to call husband, [name] and make him aware.  Palliative Care, NP and Chaplain present at bedside for family support.  Family reports pt should be taken to [facility].  [facility] called and referral number [phone] given by Resa Miner.

## 2014-11-26 NOTE — Progress Notes (Signed)
Present with family for spiritual support after Mrs Mandley's death.

## 2014-11-26 NOTE — Progress Notes (Signed)
Daily Progress Note   Patient Name: [name] [name]       Date: 14-Dec-2014 DOB: August 22, 1940  Age: 74 y.o. MRN#: [id number] Attending Physician: Orvan Falconer, MD Primary Care Physician: Sherrie Mustache, MD Admit Date: 11/09/2014  Reason for Consultation/Follow-up: Psychosocial/spiritual support and Terminal care  Subjective: Mrs. Shuttleworth is lying quietly in bed with her husband and brother in law at her side. Nursing tells me that she had an uneventful night and is scheduled to be moved to the floor today.  Her husband asks about her length of time left and I tell him that I think her time will be short. He has no question or concerns. Support is given.    Length of Stay: 13 days  Current Medications: Scheduled Meds:  . antiseptic oral rinse  7 mL Mouth Rinse BID    Continuous Infusions: . morphine 3 mg/hr (2014/12/14 1201)    PRN Meds: acetaminophen, LORazepam  Palliative Performance Scale: 10%     Vital Signs: BP 104/57 mmHg  Pulse 107  Temp(Src) 98.7 F (37.1 C) (Axillary)  Resp 22  Ht 5\' 3"  (1.6 m)  Wt 78.2 kg (172 lb 6.4 oz)  BMI 30.55 kg/m2  SpO2 96% SpO2: SpO2: 96 % O2 Device: O2 Device: Not Delivered O2 Flow Rate: O2 Flow Rate (L/min): 2 L/min  Intake/output summary:  Intake/Output Summary (Last 24 hours) at 12/14/2014 1215 Last data filed at 12/14/14 1000  Gross per 24 hour  Intake  52.71 ml  Output    375 ml  Net -322.29 ml   LBM:  flexiseal rectal tube draining dark liquid stool.  Baseline Weight: Weight: 74.8 kg (164 lb 14.5 oz) Most recent weight: Weight: 78.2 kg (172 lb 6.4 oz)            Additional Data Reviewed: Recent Labs     11/22/14  0415  WBC  21.4*  HGB  9.3*  PLT  223  NA  142  BUN  17  CREATININE  0.51     Problem List:  Patient Active Problem List   Diagnosis Date Noted  . Palliative care encounter   . AAA (abdominal aortic aneurysm)   . Acute on chronic systolic heart failure 52/84/1324  . AAA (abdominal aortic aneurysm)  without rupture 11/16/2014  . Hypotension 11/14/2014  . Anemia, iron deficiency 11/14/2014  . Vitamin B12 deficiency 11/14/2014  . Hyperglycemia, drug-induced 11/12/2014  . Atrial flutter 11/12/2014  . Obesity 11/12/2014  . HCAP (healthcare-associated pneumonia) 11/25/2014  . Ischemic cardiomyopathy 05/01/2014  . Diarrhea 04/26/2014  . Dehydration 04/26/2014  . Acute respiratory failure with hypercapnia 04/19/2014  . CAP (community acquired pneumonia) 04/19/2014  . Elevated troponin 04/17/2014  . Chest pain 04/17/2014  . Congestive heart disease   . COPD (chronic obstructive pulmonary disease) 04/15/2014  . Chronic combined systolic and diastolic congestive heart failure 12/14/2013  . Difficulty speaking 12/13/2013  . Weakness-Bilat Arm / Leg 12/13/2013  . Shortness of breath 12/13/2013  . Carotid stenosis 12/13/2013  . Acute on chronic respiratory failure 10/20/2013  . History of ventricular tachycardia 10/20/2013  . Occlusion and stenosis of carotid artery with cerebral infarction 06/14/2013  . Abnormal urinalysis 05/26/2012  . Back pain 05/26/2012  . Numbness and tingling of right face 04/06/2012  . At risk for amiodarone toxicity with long term use 01/31/2012  . Occlusion and stenosis of carotid artery without mention of cerebral infarction 10/25/2011  . Acute on chronic respiratory failure with hypercapnia 07/18/2011  . Dyspnea  06/17/2011  . Physical deconditioning 06/05/2011  . Anxiety disorder 05/31/2011    Class: Chronic  . SVT (supraventricular tachycardia) 05/24/2011  . Acute-on-chronic respiratory failure 05/15/2011  . Acute exacerbation of chronic obstructive pulmonary disease (COPD) 05/15/2011  . CAD (coronary artery disease) 04/23/2010  . VENTRICULAR TACHYCARDIA 04/23/2010  . Abdominal aortic aneurysm 12/29/2009  . TOBACCO ABUSE 08/18/2008  . HLD (hyperlipidemia) 08/17/2008  . GLAUCOMA 08/17/2008  . Essential hypertension 08/17/2008  . SUPRAVENTRICULAR  TACHYCARDIA 08/17/2008  . Cerebrovascular disease 08/17/2008  . COPD GOLD IV/ 02 dep 08/17/2008     Palliative Care Assessment & Plan    Code Status:  DNR  Goals of Care:  Comfort care  Symptom Management:  Morphine continuous infusion at 2 mg per hour currently.   Palliative Prophylaxis:  flexiseal rectal tube draining dark liquid stool.   Psycho-social/Spiritual:  Desire for further Chaplaincy support: Ongoing   Prognosis: Hours - Days Discharge Planning: Likely hospital death.    Care plan was discussed with nursing staff, CM, SW, and Dr. Marin Comment  Thank you for allowing the Palliative Medicine Team to assist in the care of this patient.   Time In: 0905 Time Out: 0945 Total Time 40 minutes Prolonged Time Billed  no     Greater than 50%  of this time was spent counseling and coordinating care related to the above assessment and plan.   Drue Novel, NP  2014/12/09, 12:15 PM  Please contact Palliative Medicine Team phone at [phone] for questions and concerns.

## 2014-11-26 NOTE — Discharge Summary (Signed)
Physician Discharge Summary  [name] [name] WVP:[id number] DOB: [birth year] DOA: 11/17/2014  PCP: Sherrie Mustache, MD  Admit date: 11/16/2014 Discharge date: 11/26/14  Time spent: 35 minutes  Recommendations for Outpatient Follow-up:  1. Patient is deceased.   Discharge Diagnoses:  Principal Problem:   HCAP (healthcare-associated pneumonia) Active Problems:   Essential hypertension   Acute exacerbation of chronic obstructive pulmonary disease (COPD)   Acute on chronic respiratory failure with hypercapnia   Chronic combined systolic and diastolic congestive heart failure   Ischemic cardiomyopathy   Hyperglycemia, drug-induced   Atrial flutter   Obesity   Hypotension   Anemia, iron deficiency   Vitamin B12 deficiency   AAA (abdominal aortic aneurysm) without rupture   Acute on chronic systolic heart failure   AAA (abdominal aortic aneurysm)   Palliative care encounter   Discharge Condition: Deceased.   Diet recommendation: None.  Filed Weights   11/21/14 0500 11/22/14 0800 11-26-2014 1400  Weight: 84.6 kg (186 lb 8.2 oz) 78.2 kg (172 lb 6.4 oz) 74.8 kg (164 lb 14.5 oz)    History of present illness: Patient was admitted by me on Nov 11, 2014 for healthcare acquired pneumonia and respiratory failure.   As per my prior H and P:  " [name] [name] is an 74 y.o. female with hx of severe COPD on home oxygen, hx of CHF, known CAD, HTN, HLD, prior CVA, AAA, carotid stenosis, hx of VT, SVT, presented to the ER with progressive SOB, but no productive coughs, fever, or chills. She had a venous blood gas, showing pH of 7.28, and a CXR showing new right sided infiltrate. Her lactic acid was 1.59, and her WBC was 14K, with normal renal Fx tests. She was placed on Bipap, and felt better. A repeat ABG showed 7/36/64/pOx=108 on Bipap. She was started on IV Van/Zosyn for HCAP and COPD exacerbation with acute on chronic hypoxic and hypercarbic respiratory failure. Hospitalist was asked to admit  her for same.    Hospital Course:  Patient was admitted and IV antibiotics were started. For her CHF, she was seen in consultation with cardiology and Dr Harl Bowie was recommending diuresis.  Her recent ECHO in Sept 2016 shwoed LVFE 35%.  Her COPD was tx with IV steroid and neb.  Unfortunately, she had required intubation, and was subsequently extubated and placed on Bipap.  She continued to have difficult breathing, and likely required re-intubation, however, she made it clear without hesitation that she would like to receive comfort care only, and did not wish to have re-intubation.  Palliative care was consulted, and she was started on morphine drip.  She was subsequently transferred to the floor, with her family present, and the blessing of our Madison Hickman.  She appear comfortable, and today, on 11/26/2014, she expired at 14:10.  Thank you for given me the opportunity to partake in the care of this lovely patient.    Consultations:  Pulmonary and Cardiology.     Discharge Medication List as of 2014/11/26  4:21 PM    CONTINUE these medications which have NOT CHANGED   Details  acetaminophen-codeine (TYLENOL #3) 300-30 MG per tablet Take 1 tablet by mouth every 4 (four) hours as needed for moderate pain., Until Discontinued, Historical Med    amiodarone (PACERONE) 200 MG tablet Take 200 mg by mouth 2 (two) times daily. , Starting 09/20/2011, Until Discontinued, Historical Med    aspirin EC 81 MG tablet Take 81 mg by mouth at bedtime., Until  Discontinued, Historical Med    budesonide-formoterol (SYMBICORT) 160-4.5 MCG/ACT inhaler Inhale 2 puffs into the lungs 2 (two) times daily., Starting 09/07/2014, Until Discontinued, Normal    clonazePAM (KLONOPIN) 0.5 MG tablet Take 0.25-0.5 mg by mouth every 6 (six) hours as needed for anxiety. , Until Discontinued, Historical Med    clopidogrel (PLAVIX) 75 MG tablet Take 1 tablet (75 mg total) by mouth at bedtime., Starting 12/26/2011,  Until Discontinued, No Print    diltiazem (CARDIZEM CD) 300 MG 24 hr capsule TAKE 1 CAPSULE IN THE EVENING., Normal    furosemide (LASIX) 40 MG tablet Take 1 tablet (40 mg total) by mouth daily., Starting 04/17/2014, Until Discontinued, Normal    guaiFENesin (MUCINEX) 600 MG 12 hr tablet Take 1 tablet by mouth every 12 hours as needed for cough and congestion, Until Discontinued, Historical Med    ipratropium (ATROVENT) 0.02 % nebulizer solution USE 1 VIAL IN NEBULIZER EVERY 6 HOURS., Normal    isosorbide mononitrate (IMDUR) 30 MG 24 hr tablet TAKE ONE TABLET BY MOUTH AT BEDTIME., Normal    latanoprost (XALATAN) 0.005 % ophthalmic solution Place 1 drop into both eyes at bedtime., Until Discontinued, Historical Med    levalbuterol (XOPENEX HFA) 45 MCG/ACT inhaler Inhale 2 puffs into the lungs every 4 (four) hours as needed for wheezing., Starting 09/07/2013, Until Discontinued, Normal    mirtazapine (REMERON) 15 MG tablet Take 7.5 mg by mouth at bedtime. Take 1/2 tablet by mouth at bedtime, Until Discontinued, Historical Med    nitroGLYCERIN (NITROSTAT) 0.4 MG SL tablet Place 0.4 mg under the tongue every 5 (five) minutes as needed for chest pain (may repeat x3). Chest pain, Starting 07/10/2010, Until Discontinued, Historical Med    nystatin (MYCOSTATIN) 100000 UNIT/ML suspension Take 5 mLs by mouth 4 (four) times daily. , Starting 04/04/2014, Until Discontinued, Historical Med    omeprazole (PRILOSEC) 20 MG capsule Take 20 mg by mouth daily as needed (for acid reflux). , Until Discontinued, Historical Med    ondansetron (ZOFRAN) 8 MG tablet TAKE 1/2 TABLET ONCE DAILY AS NEEDED FOR NAUSEA, Historical Med    OXYGEN Inhale 4.5 L into the lungs continuous. , Until Discontinued, Historical Med    potassium chloride SA (K-DUR,KLOR-CON) 20 MEQ tablet Take 2 tablets by mouth every morning, Until Discontinued, Historical Med    pravastatin (PRAVACHOL) 80 MG tablet Take 80 mg by mouth at bedtime.,  Until Discontinued, Historical Med    ranitidine (ZANTAC) 150 MG capsule Take 150 mg by mouth daily as needed for heartburn. , Until Discontinued, Historical Med    tamsulosin (FLOMAX) 0.4 MG CAPS capsule TAKE 1 CAPSULE AT BEDTIME FOR INCONTINENCE., Historical Med       Allergies  Allergen Reactions  . Albuterol Other (See Comments)    Jittery/shakiness  . Levaquin [Levofloxacin In D5w] Nausea Only  . Sulfa Antibiotics Nausea Only      The results of significant diagnostics from this hospitalization (including imaging, microbiology, ancillary and laboratory) are listed below for reference.    Significant Diagnostic Studies: Dg Chest 1 View  11/21/2014   CLINICAL DATA:  Shortness of breath, respiratory failure, ventilatory support  EXAM: CHEST 1 VIEW  COMPARISON:  04/21/2014  FINDINGS: Endotracheal tube 4.5 cm above the carina. NG tube enters the stomach with the tip not visualized. Monitor leads overlie the chest. Exam is rotated to the left. Heart remains enlarged with small to moderate pleural effusions, larger on the left. Left greater than right basilar collapse/consolidation persist. No pneumothorax.  Stable right PICC line position. Atherosclerosis noted of the aorta.  IMPRESSION: No significant change in left greater than right basilar collapse/ consolidation and associated pleural effusions.   Electronically Signed   By: Jerilynn Mages.  Shick M.D.   On: 11/21/2014 08:04   Ct Head Wo Contrast  11/15/2014   CLINICAL DATA:  Acute encephalopathy.  Ventilated patient.  EXAM: CT HEAD WITHOUT CONTRAST  TECHNIQUE: Contiguous axial images were obtained from the base of the skull through the vertex without intravenous contrast.  COMPARISON:  None.  FINDINGS: No mass effect or midline shift. No evidence of acute intracranial hemorrhage, or infarction. No abnormal extra-axial fluid collections. Gray-white matter differentiation is normal. Basal cisterns are preserved. There is atrophy and chronic small  vessel disease changes.  No depressed skull fractures. Visualized paranasal sinuses and mastoid air cells are not opacified. There is a partially visualized catheter within the left nasal cavity.  IMPRESSION: No acute intracranial abnormality.  Atrophy, chronic microvascular disease.   Electronically Signed   By: Fidela Salisbury M.D.   On: 11/15/2014 10:46   US Abdomen Complete  11/15/2014   CLINICAL DATA:  Known abdominal aortic aneurysm  EXAM: ULTRASOUND ABDOMEN COMPLETE  COMPARISON:  None.  FINDINGS: Gallbladder: No gallstones or wall thickening visualized. No sonographic Murphy sign noted.  Common bile duct: Diameter: 3.2 mm.  Liver: Increased echogenicity consistent with fatty infiltration.  IVC: No abnormality visualized.  Pancreas: Not well visualized  Spleen: Size and appearance within normal limits.  Right Kidney: Length: 11.4 cm. Echogenicity within normal limits. No mass or hydronephrosis visualized.  Left Kidney: Length: 11.3 cm. Echogenicity within normal limits. No mass or hydronephrosis visualized.  Abdominal aorta: There is aneurysmal dilatation of the mid to distal aorta to 5.1 cm. This has increased in size from 2011 at which time it measured 3.5 cm.  Other findings: None.  IMPRESSION: Increase in size in of abdominal aorta to 5.1 cm. Recommend followup by abdomen and pelvis CTA in 3-6 months, and vascular surgery referral/consultation if not already obtained. This recommendation follows ACR consensus guidelines: White Paper of the ACR Incidental Findings Committee II on Vascular Findings. J Am Coll Radiol 2013; 10:789-794.  Fatty liver.  No other focal abnormality is noted.   Electronically Signed   By: Inez Catalina M.D.   On: 11/15/2014 09:52   US Venous Img Lower Bilateral  11/15/2014   CLINICAL DATA:  Respiratory failure.  Evaluate for DVT.  EXAM: BILATERAL LOWER EXTREMITY VENOUS DOPPLER ULTRASOUND  TECHNIQUE: Gray-scale sonography with graded compression, as well as color Doppler and  duplex ultrasound were performed to evaluate the lower extremity deep venous systems from the level of the common femoral vein and including the common femoral, femoral, profunda femoral, popliteal and calf veins including the posterior tibial, peroneal and gastrocnemius veins when visible. The superficial great saphenous vein was also interrogated. Spectral Doppler was utilized to evaluate flow at rest and with distal augmentation maneuvers in the common femoral, femoral and popliteal veins.  COMPARISON:  None.  FINDINGS: RIGHT LOWER EXTREMITY  Common Femoral Vein: No evidence of thrombus. Normal compressibility, respiratory phasicity and response to augmentation.  Saphenofemoral Junction: No evidence of thrombus. Normal compressibility and flow on color Doppler imaging.  Profunda Femoral Vein: No evidence of thrombus. Normal compressibility and flow on color Doppler imaging.  Femoral Vein: No evidence of thrombus. Normal compressibility, respiratory phasicity and response to augmentation.  Popliteal Vein: No evidence of thrombus. Normal compressibility, respiratory phasicity and response to augmentation.  Calf Veins: No evidence of thrombus. Normal compressibility and flow on color Doppler imaging.  Superficial Great Saphenous Vein: No evidence of thrombus. Normal compressibility and flow on color Doppler imaging.  Venous Reflux:  None.  Other Findings:  None.  LEFT LOWER EXTREMITY  Common Femoral Vein: No evidence of thrombus. Normal compressibility, respiratory phasicity and response to augmentation.  Saphenofemoral Junction: No evidence of thrombus. Normal compressibility and flow on color Doppler imaging.  Profunda Femoral Vein: No evidence of thrombus. Normal compressibility and flow on color Doppler imaging.  Femoral Vein: No evidence of thrombus. Normal compressibility, respiratory phasicity and response to augmentation.  Popliteal Vein: No evidence of thrombus. Normal compressibility, respiratory phasicity  and response to augmentation.  Calf Veins: No evidence of thrombus. Normal compressibility and flow on color Doppler imaging.  Superficial Great Saphenous Vein: No evidence of thrombus. Normal compressibility and flow on color Doppler imaging.  Venous Reflux:  None.  Other Findings:  None.  IMPRESSION: No evidence of DVT within either lower extremity.   Electronically Signed   By: Sandi Mariscal M.D.   On: 11/15/2014 09:27   Dg Chest Port 1 View  11/22/2014   CLINICAL DATA:  Pneumonia.  Respiratory distress.  EXAM: PORTABLE CHEST 1 VIEW  COMPARISON:  11/21/2014  FINDINGS: Endotracheal and enteric tubes have been removed. Right PICC remains in place, grossly unchanged. Cardiac silhouette remains enlarged. Thoracic aortic calcification is noted. The patient remains mildly rotated to the left, less than on the prior study. Small right and moderate left pleural effusions do not appear significantly changed. Left greater than right basilar parenchymal lung opacities are unchanged, likely with a component of left lower lobe collapse. No pneumothorax is seen.  IMPRESSION: Unchanged, left greater than right pleural effusions and atelectasis/consolidation.   Electronically Signed   By: Logan Bores M.D.   On: 11/22/2014 07:45   Dg Chest Port 1 View  11/20/2014   CLINICAL DATA:  Patient with shortness of breath. History of congestive heart failure.  EXAM: PORTABLE CHEST 1 VIEW  COMPARISON:  Chest radiograph 11/18/2014  FINDINGS: Enteric tube courses to the inferior aspect of the radiograph, tip not included on current examination. Side-port appears at the GE junction. ET tube terminates in the mid trachea. Stable enlarged cardiac and mediastinal contours with moderate left and small right pleural effusions. Underlying left-greater-than-right pulmonary consolidation. No definite pneumothorax. Right upper extremity PICC line is present, unchanged from prior.  IMPRESSION: Interval advancement of enteric tube. The side port  may be at the GE junction, consider advancement and correlation with abdominal radiography.  ET tube terminates in the mid trachea.  Persistent moderate left and small right pleural effusions with underlying pulmonary consolidation likely representing edema and atelectasis. Infection not excluded.   Electronically Signed   By: Lovey Newcomer M.D.   On: 11/20/2014 10:20   Dg Chest Port 1 View  11/18/2014   CLINICAL DATA:  Congestive heart failure. Pt. Is on a vent. Morning portable.  EXAM: PORTABLE CHEST 1 VIEW  COMPARISON:  11/16/2014  FINDINGS: Interstitial thickening, most notable in the lung bases, moderate left and small right pleural effusions and cardiomegaly persists, consistent with congestive heart failure. No change from prior exam.  No pneumothorax.  Endotracheal tube stable with its tip 4.6 cm above the chronic.  Orogastric tube is retracted. Tip now lies 3 cm below the carina, within the mid esophagus. This will need to be further inserted, at least 20 cm, to allow the tip to enter  the stomach.  IMPRESSION: 1. Orogastric tube is retracted.  Tip now lies in the mid esophagus. 2. No other change from the prior study. Persistent changes of congestive heart failure with interstitial edema and left greater than right pleural effusions. 3. Endotracheal tube is stable and well positioned.   Electronically Signed   By: Lajean Manes M.D.   On: 11/18/2014 08:14   Dg Chest Port 1 View  11/16/2014   CLINICAL DATA:  Respiratory failure  EXAM: PORTABLE CHEST - 1 VIEW  COMPARISON:  11/15/2014  FINDINGS: The endotracheal tube tip is above the carina. There is a right arm PICC line with tip in the cavoatrial junction. Moderate cardiac enlargement noted. Bilateral pleural effusions are identified left greater than right. There is mild interstitial edema.  IMPRESSION: 1. CHF pattern appears similar to the previous exam.   Electronically Signed   By: Kerby Moors M.D.   On: 11/16/2014 09:32   Portable Chest  Xray  11/15/2014   CLINICAL DATA:  On a ventilator.  EXAM: PORTABLE CHEST - 1 VIEW  COMPARISON:  Yesterday.  FINDINGS: Endotracheal tube in satisfactory position. Nasogastric tube extending into the stomach. Right PICC tip poorly visualized in the region of the superior cavoatrial junction. Decreased bibasilar airspace opacity. Mildly decreased bilateral pleural fluid. The pulmonary vasculature and interstitial markings remain prominent, specially on the right. The cardiac silhouette remains enlarged.  IMPRESSION: 1. Decreased bibasilar atelectasis. 2. Mildly decreased bilateral pleural fluid. 3. Stable cardiomegaly, pulmonary vascular congestion and mild right lung interstitial edema.   Electronically Signed   By: Claudie Revering M.D.   On: 11/15/2014 08:22   Dg Chest Port 1 View  11/14/2014   CLINICAL DATA:  Pneumonia, ET tube placement.  EXAM: PORTABLE CHEST - 1 VIEW  COMPARISON:  Chest x-ray dated 09/16/ 2016 and chest x-ray dated 09/07/2014.  FINDINGS: Interval placement of an endotracheal tube with tip well positioned above the level of the carina. Enteric tube passes below the diaphragm.  Cardiomegaly appears stable. Lungs again appear hyperinflated suggesting COPD. Left lung base remains difficult to characterize due to the enlarged heart size.  Atelectasis versus scarring within the right mid lung region. There is slightly improved aeration at the right lung base. No new lung findings seen. No evidence of a large pleural effusion. No pneumothorax.  IMPRESSION: 1. Slightly improved aeration at the right lung base suggesting either resolving pneumonia or decreased atelectasis or edema. No new lung findings.  2. Cardiomegaly, unchanged.  3. Probable COPD.  4. Endotracheal tube well positioned with tip approximately 4 cm above the level of the carina.   Electronically Signed   By: Franki Cabot M.D.   On: 11/14/2014 09:12   Dg Chest Portable 1 View  11/01/2014   CLINICAL DATA:  Shortness of breath for 2  days with tachycardia, history of CHF and COPD  EXAM: PORTABLE CHEST - 1 VIEW  COMPARISON:  09/07/2014  FINDINGS: Moderate cardiac enlargement. Calcification of the aorta. Infiltrate in the right mid to lower lung zone. Left lower lobe not evaluated on single AP view with cardiac enlargement. Left upper lobe is clear.  IMPRESSION: Infiltrate right mid to lower lung zone concerning for pneumonia/pneumonitis.   Electronically Signed   By: Skipper Cliche M.D.   On: 10/31/2014 19:05   Dg Chest Port 1v Same Day  11/14/2014   CLINICAL DATA:  Right side PICC line placement.  EXAM: PORTABLE CHEST - 1 VIEW SAME DAY  COMPARISON:  11/14/2014  FINDINGS:  Right PICC line is in place with the tip in the SVC. Endotracheal tube and NG tube are unchanged. There is cardiomegaly. Bilateral lower lobe airspace opacities with small effusions. Mild vascular congestion.  IMPRESSION: Right PICC line tip in the SVC.  Continued cardiomegaly with vascular congestion.  Small effusions with bibasilar atelectasis or infiltrates.   Electronically Signed   By: Rolm Baptise M.D.   On: 11/14/2014 11:45    Microbiology: Recent Results (from the past 240 hour(s))  C difficile quick scan w PCR reflex     Status: None   Collection Time: 11/19/14 12:02 AM  Result Value Ref Range Status   C Diff antigen NEGATIVE NEGATIVE Final   C Diff toxin NEGATIVE NEGATIVE Final   C Diff interpretation Negative for toxigenic C. difficile  Final     Labs: Basic Metabolic Panel:  Recent Labs Lab 11/18/14 0415 11/19/14 0451 11/20/14 0523 11/20/14 2136 11/21/14 0505 11/22/14 0415  NA 140 143 143  --  143 142  K 3.3* 2.2* 2.8* 3.3* 3.6 3.2*  CL 100* 95* 98*  --  94* 89*  CO2 33* 37* 38*  --  40* 44*  GLUCOSE 173* 161* 195*  --  164* 137*  BUN 27* 25* 22*  --  21* 17  CREATININE 0.64 0.47 0.47  --  0.48 0.51  CALCIUM 7.5* 7.0* 7.2*  --  7.6* 7.7*  MG  --   --  1.8  --   --   --    Liver Function Tests: No results for input(s): AST, ALT,  ALKPHOS, BILITOT, PROT, ALBUMIN in the last 168 hours. No results for input(s): LIPASE, AMYLASE in the last 168 hours. No results for input(s): AMMONIA in the last 168 hours. CBC:  Recent Labs Lab 11/18/14 0415 11/19/14 0451 11/20/14 0523 11/21/14 0505 11/22/14 0415  WBC 13.1* 13.4* 14.1* 22.2* 21.4*  HGB 9.6* 9.2* 9.0* 9.5* 9.3*  HCT 32.2* 30.9* 30.3* 32.5* 31.7*  MCV 90.4 90.9 91.5 92.1 91.6  PLT 241 216 207 241 223   Cardiac Enzymes:  Recent Labs Lab 11/20/14 2136 11/21/14 0320 11/21/14 0826  TROPONINI <0.03 <0.03 <0.03   BNP: BNP (last 3 results)  Recent Labs  11/05/2014 1903 11/16/14 0415 11/21/14 0505  BNP 549.0* 1064.0* 571.0*    ProBNP (last 3 results)  Recent Labs  12/29/13 1238 04/15/14 1417 10/18/14 1456  PROBNP 195.0* 230.0* 288.0*    CBG:  Recent Labs Lab 11/21/14 1954 11/22/14 0007 11/22/14 0356 11/22/14 0732 11/22/14 1134  GLUCAP 98 153* 157* 121* 124*   Signed:  LE,PETER  [facility] 12-12-2014, 4:47 PM

## 2014-11-26 NOTE — Progress Notes (Signed)
[facility] PROGRESS NOTE  [name] [name] ASN:[id number] DOB: 1940-11-04    PCP:   Sherrie Mustache, MD   HPI:  [name] [name] is an 74 y.o. female with hx of severe COPD, CHF, hx of HTN, CVA, afib, admitted for respiratory failure by me, now has elected to only receive comfort care. Her family has been at her bedside. Currently on oxygen supplementation and morphine drip. She appears comfortable. Facemask has been changed to [place] for her comfort.  Appreciate Palliative care medicine's help.    [place] of Systems:    Past Medical History  Diagnosis Date  . Hypertension   . Hyperlipidemia   . Cerebrovascular disease, unspecified   . Unspecified glaucoma   . Obesity, unspecified   . Coronary artery disease     cath 04/21/14:severe multivessel disease with a left dominant coronary system, chronic occlusion of the LAD, and heavily calcified severe stenosis of the proximal left circumflex.  She underwent rotational atherectomy and stenting of the left circumflex  . Ventricular tachycardia   . SVT (supraventricular tachycardia)   . AAA (abdominal aortic aneurysm)   . COPD (chronic obstructive pulmonary disease) 2013    [place] of breath     WITH ACTIVITY--USES OXYGEN AS NEEDED  2&1/2 L PER NASAL CANNUL  . Complication of anesthesia     PT STATES SHE CAN NOT BE PUT TO SLEEP BECAUSE OF HER LUNG PROBLEMS  . Carotid artery occlusion   . Anemia   . CHF (congestive heart failure)   . Abdominal aneurysm     "hasn't been repaired" (10/20/2013)  . Pneumonia X ~ 2  . On home oxygen therapy     "4L; 24/7" (10/20/2013)  . GERD (gastroesophageal reflux disease)   . Migraines     "before tubal ligation I had them alot; seldom have one anymore" (10/20/2013)  . Arthritis     "knees mainly" (10/20/2013)  . Bladder cancer 2009  . Atrial fibrillation   . Bell palsy 2014    Past Surgical History  Procedure Laterality Date  . Cystoscopy    . Bladder tumor excision       resection of 5 bladder, and fulguration of 1 small bladder tumor  . Cystoscopy      cold cup bladder biopsy of five tumors, and fulguration of one tumor  . Wedge resection Right 1980's    Remote right lung  . Cystoscopy with biopsy  12/24/2011    Procedure: CYSTOSCOPY WITH BIOPSY;  Surgeon: Fredricka Bonine, MD;  Location: [facility];  Service: Urology;  Laterality: N/A;  . Tubal ligation Bilateral 1980's  . Cataract extraction w/ intraocular lens  implant, bilateral Bilateral ~ 2011  . Cardiac catheterization  "several"    "too bad to put stents in"  . Left heart catheterization with coronary angiogram N/A 04/18/2014    Procedure: LEFT HEART CATHETERIZATION WITH CORONARY ANGIOGRAM;  Surgeon: Jolaine Artist, MD;  Location: [facility] CATH LAB;  Service: Cardiovascular;  Laterality: N/A;  . Percutaneous coronary rotoblator intervention (pci-r) N/A 04/21/2014    Procedure: PERCUTANEOUS CORONARY ROTOBLATOR INTERVENTION (PCI-R);  Surgeon: Blane Ohara, MD;  Location: [facility] CATH LAB;  Service: Cardiovascular;  Laterality: N/A;    Medications:  HOME MEDS: Prior to Admission medications   Medication Sig Start Date End Date Taking? Authorizing Provider  acetaminophen-codeine (TYLENOL #3) 300-30 MG per tablet Take 1 tablet by mouth every 4 (four) hours as needed for moderate pain.   Yes Historical Provider,  MD  amiodarone (PACERONE) 200 MG tablet Take 200 mg by mouth 2 (two) times daily.  09/20/11  Yes Lelon Perla, MD  aspirin EC 81 MG tablet Take 81 mg by mouth at bedtime.   Yes Historical Provider, MD  budesonide-formoterol (SYMBICORT) 160-4.5 MCG/ACT inhaler Inhale 2 puffs into the lungs 2 (two) times daily. 09/07/14  Yes Tanda Rockers, MD  clonazePAM (KLONOPIN) 0.5 MG tablet Take 0.25-0.5 mg by mouth every 6 (six) hours as needed for anxiety.    Yes Historical Provider, MD  clopidogrel (PLAVIX) 75 MG tablet Take 1 tablet (75 mg total) by mouth at bedtime. 12/26/11  Yes Festus Aloe, MD   diltiazem (CARDIZEM CD) 300 MG 24 hr capsule TAKE 1 CAPSULE IN THE EVENING. 02/07/14  Yes Lelon Perla, MD  furosemide (LASIX) 40 MG tablet Take 1 tablet (40 mg total) by mouth daily. Patient taking differently: Take 40 mg by mouth every morning.  04/17/14  Yes Reyne Dumas, MD  guaiFENesin (MUCINEX) 600 MG 12 hr tablet Take 1 tablet by mouth every 12 hours as needed for cough and congestion   Yes Historical Provider, MD  ipratropium (ATROVENT) 0.02 % nebulizer solution USE 1 VIAL IN NEBULIZER EVERY 6 HOURS. Patient taking differently: USE 1 VIAL IN NEBULIZER EVERY 4 HOURS FOR SHORTNESS OF BREATH/WHEEZING 03/14/14  Yes Tanda Rockers, MD  isosorbide mononitrate (IMDUR) 30 MG 24 hr tablet TAKE ONE TABLET BY MOUTH AT BEDTIME. Patient taking differently: TAKE ONE TABLET BY MOUTH EVERY MORNING 08/12/14  Yes Lelon Perla, MD  latanoprost (XALATAN) 0.005 % ophthalmic solution Place 1 drop into both eyes at bedtime.   Yes Historical Provider, MD  levalbuterol [facility] HFA) 45 MCG/ACT inhaler Inhale 2 puffs into the lungs every 4 (four) hours as needed for wheezing. Patient taking differently: Inhale 2 puffs into the lungs every 4 (four) hours as needed for wheezing or shortness of breath.  09/07/13  Yes Tammy S Parrett, NP  mirtazapine (REMERON) 15 MG tablet Take 7.5 mg by mouth at bedtime. Take 1/2 tablet by mouth at bedtime   Yes Historical Provider, MD  nitroGLYCERIN (NITROSTAT) 0.4 MG SL tablet Place 0.4 mg under the tongue every 5 (five) minutes as needed for chest pain (may repeat x3). Chest pain 07/10/10  Yes Lelon Perla, MD  nystatin (MYCOSTATIN) 100000 UNIT/ML suspension Take 5 mLs by mouth 4 (four) times daily.  04/04/14  Yes Historical Provider, MD  omeprazole (PRILOSEC) 20 MG capsule Take 20 mg by mouth daily as needed (for acid reflux).    Yes Historical Provider, MD  ondansetron (ZOFRAN) 8 MG tablet TAKE 1/2 TABLET ONCE DAILY AS NEEDED FOR NAUSEA 10/19/14  Yes Historical Provider, MD   OXYGEN Inhale 4.5 L into the lungs continuous.    Yes Historical Provider, MD  potassium chloride SA (K-DUR,KLOR-CON) 20 MEQ tablet Take 2 tablets by mouth every morning   Yes Historical Provider, MD  pravastatin (PRAVACHOL) 80 MG tablet Take 80 mg by mouth at bedtime.   Yes Historical Provider, MD  ranitidine (ZANTAC) 150 MG capsule Take 150 mg by mouth daily as needed for heartburn.    Yes Historical Provider, MD  tamsulosin (FLOMAX) 0.4 MG CAPS capsule TAKE 1 CAPSULE AT BEDTIME FOR INCONTINENCE. 10/19/14  Yes Historical Provider, MD     Allergies:  Allergies  Allergen Reactions  . Albuterol Other (See Comments)    Jittery/shakiness  . Levaquin [Levofloxacin In D5w] Nausea Only  . Sulfa Antibiotics Nausea Only  Social History:   reports that she quit smoking about 3 years ago. Her smoking use included Cigarettes. She has a 100 pack-year smoking history. She has never used smokeless tobacco. She reports that she does not drink alcohol or use illicit drugs.  Family History: Family History  Problem Relation Age of Onset  . Heart disease Mother 81  . Heart disease Father   . Hyperlipidemia Father   . Hypertension Father   . Diabetes Daughter   . Peripheral vascular disease Daughter      Physical Exam: Filed Vitals:   12/18/2014 0300 12/18/14 0400 12-18-14 0500 12-18-2014 0600  BP:      Pulse:      Temp:      TempSrc:      Resp: 17 18 18 19   Height:      Weight:      SpO2:       Blood pressure 104/57, pulse 107, temperature 98.7 F (37.1 C), temperature source Axillary, resp. rate 19, height 5\' 3"  (1.6 m), weight 78.2 kg (172 lb 6.4 oz), SpO2 96 %.  GEN:  Pleasant patient lying in the stretcher in no acute distress; cooperative with exam. PSYCH:  alert and oriented x4; does not appear anxious or depressed; affect is appropriate. HEENT: Mucous membranes pink and anicteric; PERRLA; EOM intact; no cervical lymphadenopathy nor thyromegaly or carotid bruit; no JVD; There were  no stridor. Neck is very supple. Breasts:: Not examined CHEST WALL: No tenderness CHEST: Normal respiration, clear to auscultation bilaterally.  HEART: Regular rate and rhythm.  There are no murmur, rub, or gallops.   BACK: No kyphosis or scoliosis; no CVA tenderness ABDOMEN: soft and non-tender; no masses, no organomegaly, normal abdominal bowel sounds; no pannus; no intertriginous candida. There is no rebound and no distention. Rectal Exam: Not done EXTREMITIES: No bone or joint deformity; age-appropriate arthropathy of the hands and knees; no edema; no ulcerations.  There is no calf tenderness. Genitalia: not examined PULSES: 2+ and symmetric SKIN: Normal hydration no rash or ulceration CNS: Cranial nerves 2-12 grossly intact no focal lateralizing neurologic deficit.  Speech is fluent; uvula elevated with phonation, facial symmetry and tongue midline. DTR are normal bilaterally, cerebella exam is intact, barbinski is negative and strengths are equaled bilaterally.  No sensory loss.   Labs on Admission:  Basic Metabolic Panel:  Recent Labs Lab 11/18/14 0415 11/19/14 0451 11/20/14 0523 11/20/14 2136 11/21/14 0505 11/22/14 0415  NA 140 143 143  --  143 142  K 3.3* 2.2* 2.8* 3.3* 3.6 3.2*  CL 100* 95* 98*  --  94* 89*  CO2 33* 37* 38*  --  40* 44*  GLUCOSE 173* 161* 195*  --  164* 137*  BUN 27* 25* 22*  --  21* 17  CREATININE 0.64 0.47 0.47  --  0.48 0.51  CALCIUM 7.5* 7.0* 7.2*  --  7.6* 7.7*  MG  --   --  1.8  --   --   --    CBC:  Recent Labs Lab 11/18/14 0415 11/19/14 0451 11/20/14 0523 11/21/14 0505 11/22/14 0415  WBC 13.1* 13.4* 14.1* 22.2* 21.4*  HGB 9.6* 9.2* 9.0* 9.5* 9.3*  HCT 32.2* 30.9* 30.3* 32.5* 31.7*  MCV 90.4 90.9 91.5 92.1 91.6  PLT 241 216 207 241 223   Cardiac Enzymes:  Recent Labs Lab 11/20/14 2136 11/21/14 0320 11/21/14 0826  TROPONINI <0.03 <0.03 <0.03    CBG:  Recent Labs Lab 11/21/14 1954 11/22/14 0007 11/22/14 0356  11/22/14 0732 11/22/14 1134  GLUCAP 98 153* 157* 121* 124*   Assessment/Plan Present on Admission:  . Acute exacerbation of chronic obstructive pulmonary disease (COPD) . Acute on chronic respiratory failure with hypercapnia . HCAP (healthcare-associated pneumonia) . Chronic combined systolic and diastolic congestive heart failure . Ischemic cardiomyopathy . Hyperglycemia, drug-induced . Essential hypertension . Atrial flutter . Obesity . Anemia, iron deficiency . Vitamin B12 deficiency . AAA (abdominal aortic aneurysm) without rupture  PLAN: Respiratory failure and CHF: Continue with comfort care. Will transfer her to the floor today. Continue with morphine drip. Her death is imminent and patient and family are aware.   Other plans as per orders.  Code Status: DNR/CMO.    Orvan Falconer, MD. [facility] Pager [phone] 7pm to 7am.  11-30-14, 8:05 AM

## 2014-11-26 DEATH — deceased

## 2014-11-29 ENCOUNTER — Ambulatory Visit: Payer: Medicare Other | Admitting: Internal Medicine

## 2016-01-08 IMAGING — CR DG CHEST 1V PORT
1 series · 1 of 1 positions shown · non-contrast
Comparison: 04/19/2014

CLINICAL DATA: Low oxygen saturation

EXAM:
PORTABLE CHEST - 1 VIEW

[portable]
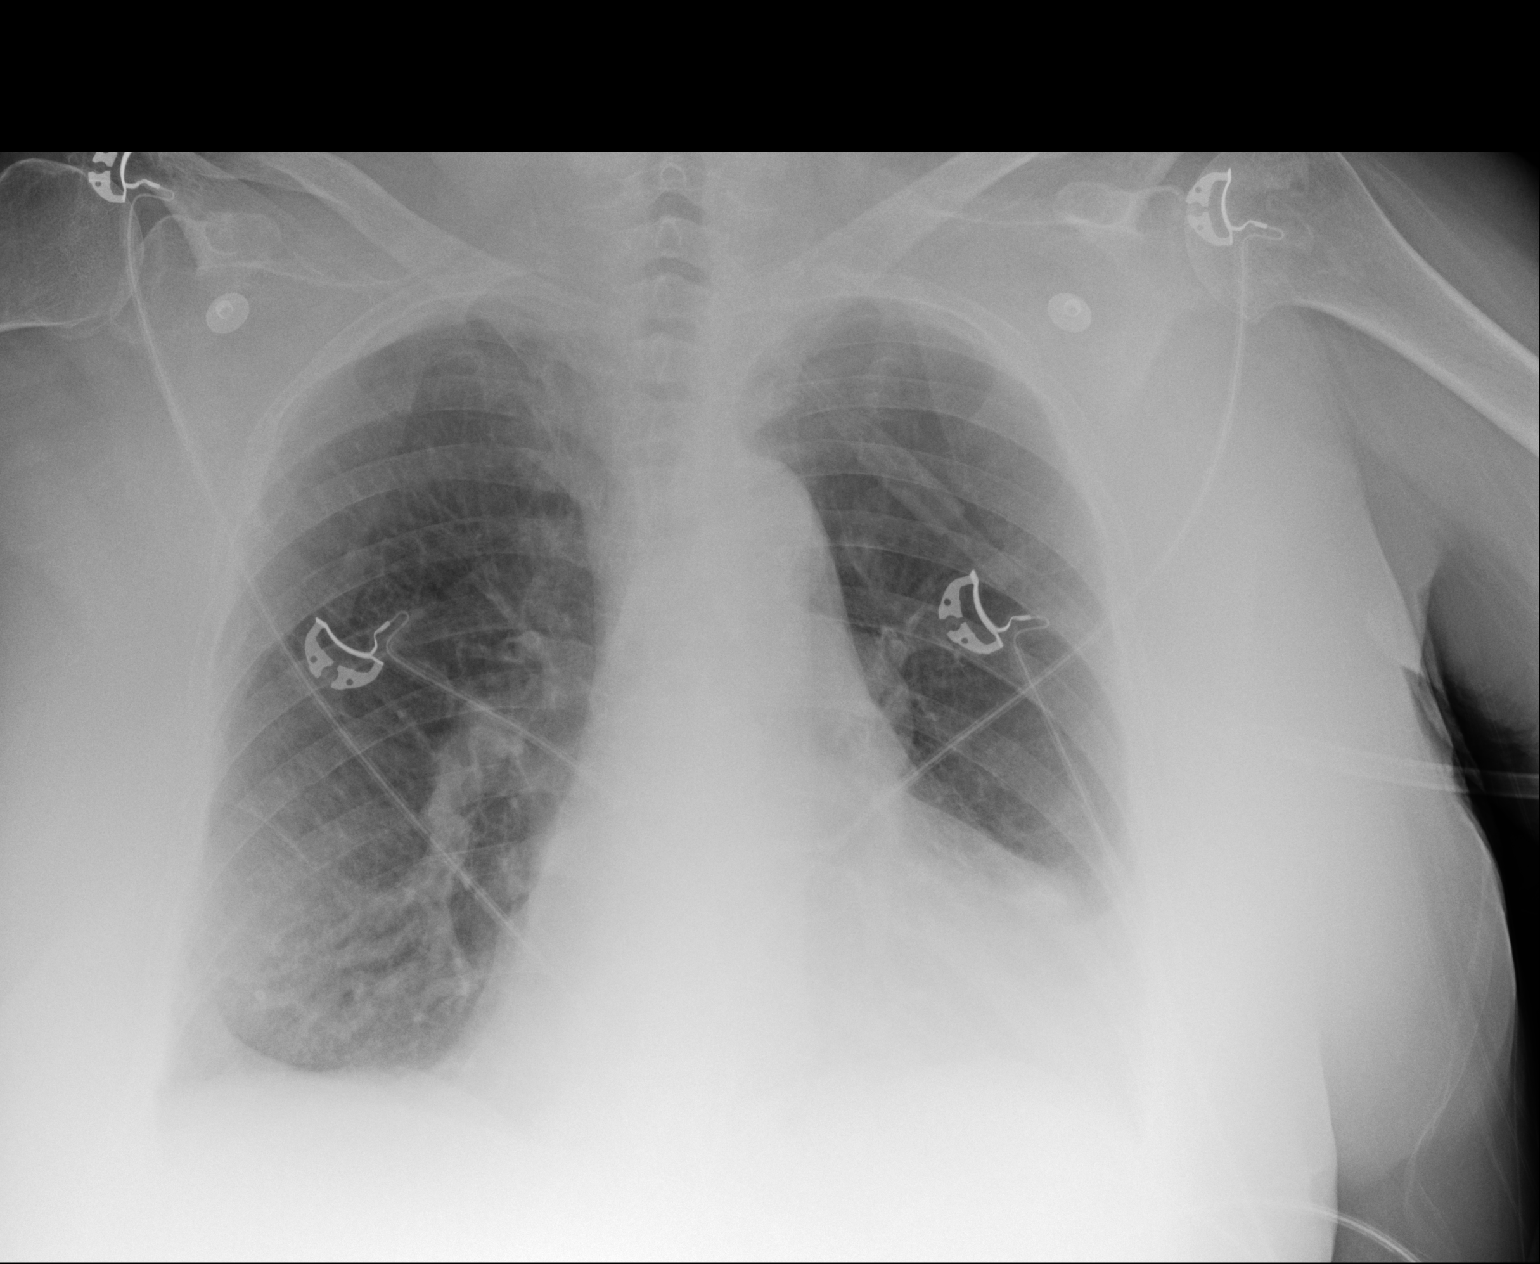

[1 of 1 positions shown; findings below may reference images not displayed]

FINDINGS: Cardiac shadow remains enlarged. The degree of vascular congestion
is improved when compared with the prior exam. A focal infiltrate is
seen. No acute bony abnormality is noted.
IMPRESSION: No active disease.

## 2016-08-03 IMAGING — CR DG CHEST 1V PORT
1 series · 1 of 1 positions shown · non-contrast
Comparison: Chest radiograph 11/18/2014

CLINICAL DATA: Patient with shortness of breath. History of
congestive heart failure.

EXAM:
PORTABLE CHEST 1 VIEW

[ap portable]
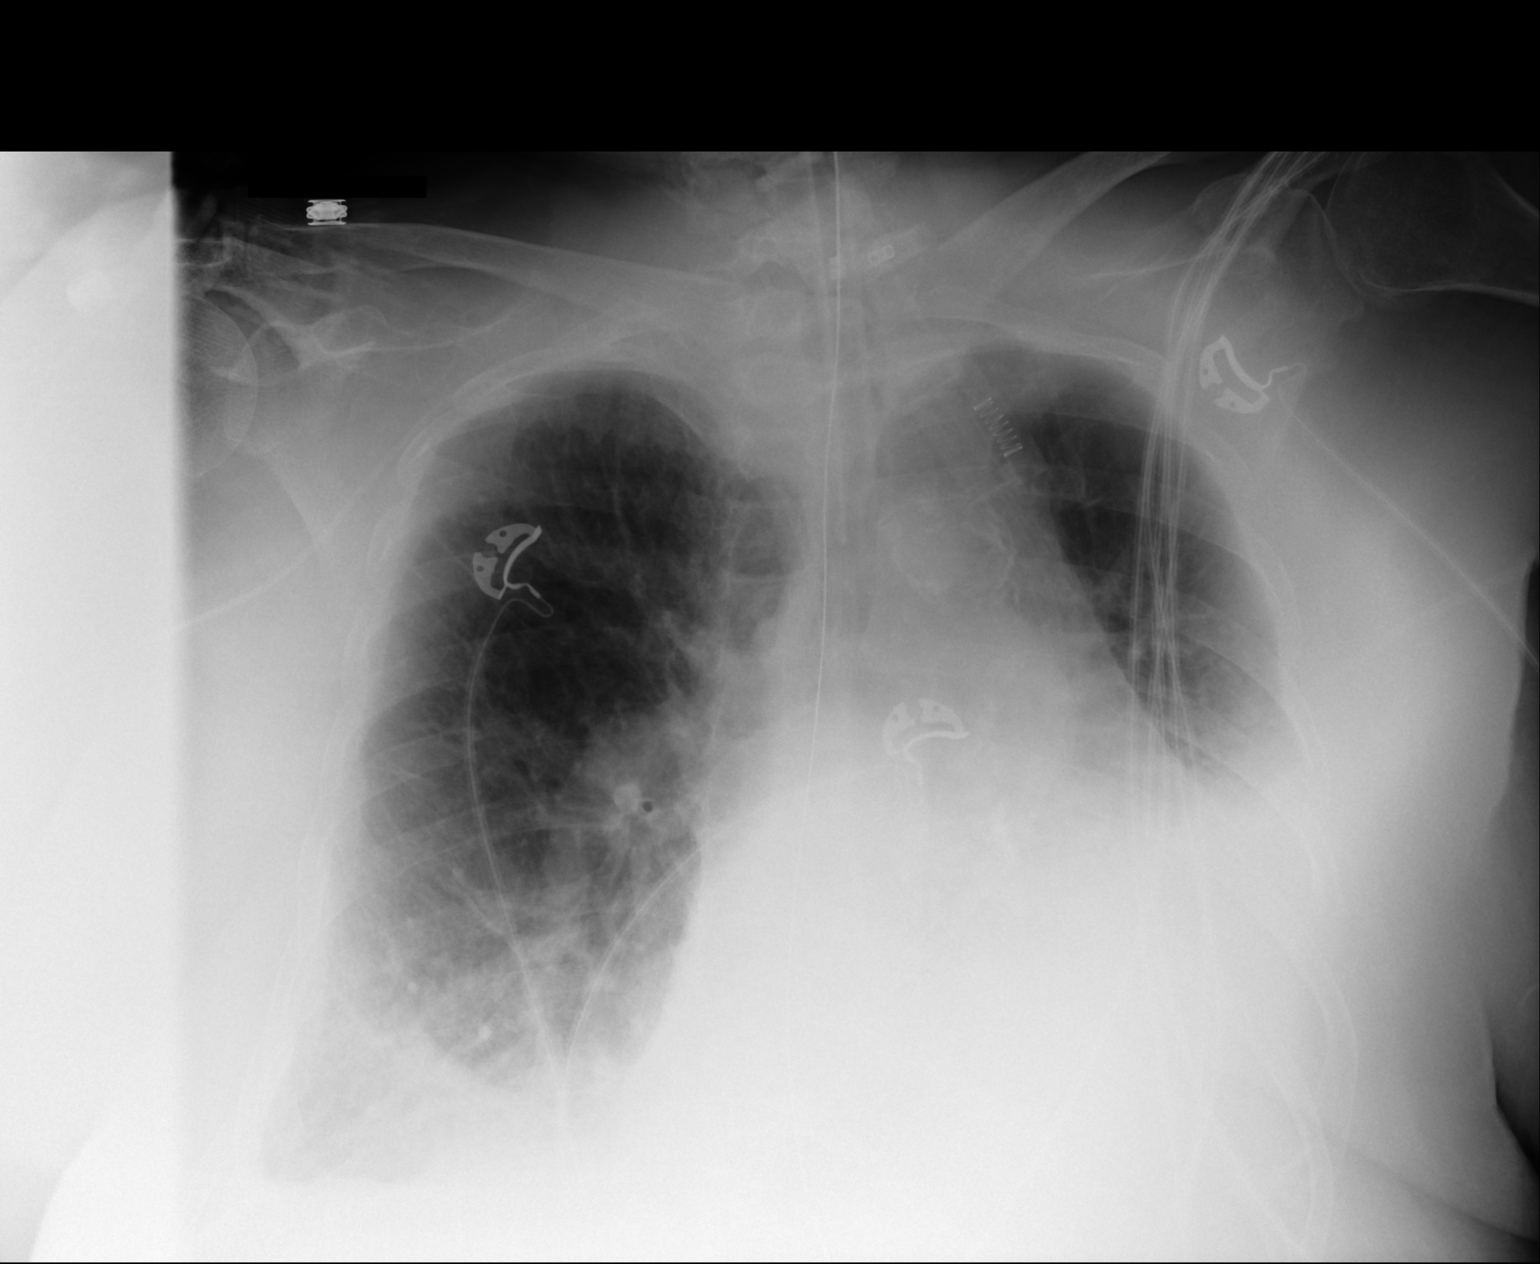

[1 of 1 positions shown; findings below may reference images not displayed]

FINDINGS: Enteric tube courses to the inferior aspect of the radiograph, tip
not included on current examination. Side-port appears at the GE
junction. ET tube terminates in the mid trachea. Stable enlarged
cardiac and mediastinal contours with moderate left and small right
pleural effusions. Underlying left-greater-than-right pulmonary
consolidation. No definite pneumothorax. Right upper extremity PICC
line is present, unchanged from prior.
IMPRESSION: Interval advancement of enteric tube. The side port may be at the GE
junction, consider advancement and correlation with abdominal
radiography.

ET tube terminates in the mid trachea.

Persistent moderate left and small right pleural effusions with
underlying pulmonary consolidation likely representing edema and
atelectasis. Infection not excluded.
